# Patient Record
Sex: Female | Born: 1965 | Race: Black or African American | Hispanic: No | Smoking: Current every day smoker
Health system: Southern US, Community
[De-identification: ages and names within clinical notes are randomized; demographics above are authoritative.]

## PROBLEM LIST (undated history)

## (undated) ENCOUNTER — Emergency Department (HOSPITAL_COMMUNITY): Admission: EM | Payer: Medicare Other | Source: Home / Self Care

## (undated) DIAGNOSIS — K760 Fatty (change of) liver, not elsewhere classified: Secondary | ICD-10-CM

## (undated) DIAGNOSIS — J40 Bronchitis, not specified as acute or chronic: Secondary | ICD-10-CM

## (undated) DIAGNOSIS — K219 Gastro-esophageal reflux disease without esophagitis: Secondary | ICD-10-CM

## (undated) DIAGNOSIS — R519 Headache, unspecified: Secondary | ICD-10-CM

## (undated) DIAGNOSIS — G8929 Other chronic pain: Secondary | ICD-10-CM

## (undated) DIAGNOSIS — F419 Anxiety disorder, unspecified: Secondary | ICD-10-CM

## (undated) DIAGNOSIS — F329 Major depressive disorder, single episode, unspecified: Secondary | ICD-10-CM

## (undated) DIAGNOSIS — I10 Essential (primary) hypertension: Secondary | ICD-10-CM

## (undated) DIAGNOSIS — M797 Fibromyalgia: Secondary | ICD-10-CM

## (undated) DIAGNOSIS — J189 Pneumonia, unspecified organism: Secondary | ICD-10-CM

## (undated) DIAGNOSIS — R51 Headache: Secondary | ICD-10-CM

## (undated) DIAGNOSIS — M1990 Unspecified osteoarthritis, unspecified site: Secondary | ICD-10-CM

## (undated) DIAGNOSIS — F32A Depression, unspecified: Secondary | ICD-10-CM

## (undated) DIAGNOSIS — M549 Dorsalgia, unspecified: Secondary | ICD-10-CM

## (undated) DIAGNOSIS — M75101 Unspecified rotator cuff tear or rupture of right shoulder, not specified as traumatic: Secondary | ICD-10-CM

## (undated) DIAGNOSIS — K859 Acute pancreatitis without necrosis or infection, unspecified: Secondary | ICD-10-CM

## (undated) HISTORY — PX: COLONOSCOPY: SHX174

## (undated) HISTORY — PX: BONE GRAFT HIP ILIAC CREST: SUR159

## (undated) HISTORY — PX: OTHER SURGICAL HISTORY: SHX169

## (undated) HISTORY — PX: CHOLECYSTECTOMY: SHX55

## (undated) HISTORY — PX: BREAST BIOPSY: SHX20

## (undated) HISTORY — PX: ABDOMINAL HYSTERECTOMY: SHX81

## (undated) HISTORY — PX: BACK SURGERY: SHX140

## (undated) HISTORY — PX: CARPAL TUNNEL RELEASE: SHX101

---

## 1997-11-10 ENCOUNTER — Encounter: Admission: RE | Admit: 1997-11-10 | Discharge: 1998-02-08 | Payer: Self-pay | Admitting: Specialist

## 1998-04-03 ENCOUNTER — Emergency Department (HOSPITAL_COMMUNITY): Admission: EM | Admit: 1998-04-03 | Discharge: 1998-04-03 | Payer: Self-pay | Admitting: Internal Medicine

## 1998-05-12 ENCOUNTER — Ambulatory Visit (HOSPITAL_COMMUNITY): Admission: RE | Admit: 1998-05-12 | Discharge: 1998-05-12 | Payer: Self-pay | Admitting: Specialist

## 1998-09-27 ENCOUNTER — Encounter: Admission: RE | Admit: 1998-09-27 | Discharge: 1998-10-24 | Payer: Self-pay | Admitting: Specialist

## 1998-12-22 ENCOUNTER — Other Ambulatory Visit: Admission: RE | Admit: 1998-12-22 | Discharge: 1998-12-22 | Payer: Self-pay | Admitting: Obstetrics

## 2000-02-15 ENCOUNTER — Encounter: Payer: Self-pay | Admitting: Specialist

## 2000-02-15 ENCOUNTER — Encounter: Admission: RE | Admit: 2000-02-15 | Discharge: 2000-02-15 | Payer: Self-pay | Admitting: Specialist

## 2004-02-16 ENCOUNTER — Emergency Department (HOSPITAL_COMMUNITY): Admission: EM | Admit: 2004-02-16 | Discharge: 2004-02-17 | Payer: Self-pay | Admitting: Emergency Medicine

## 2004-11-30 ENCOUNTER — Other Ambulatory Visit: Admission: RE | Admit: 2004-11-30 | Discharge: 2004-11-30 | Payer: Self-pay | Admitting: Obstetrics and Gynecology

## 2005-03-25 ENCOUNTER — Emergency Department (HOSPITAL_COMMUNITY): Admission: EM | Admit: 2005-03-25 | Discharge: 2005-03-25 | Payer: Self-pay | Admitting: Emergency Medicine

## 2005-06-28 ENCOUNTER — Emergency Department (HOSPITAL_COMMUNITY): Admission: EM | Admit: 2005-06-28 | Discharge: 2005-06-28 | Payer: Self-pay | Admitting: Emergency Medicine

## 2005-08-10 ENCOUNTER — Emergency Department (HOSPITAL_COMMUNITY): Admission: EM | Admit: 2005-08-10 | Discharge: 2005-08-10 | Payer: Self-pay | Admitting: Emergency Medicine

## 2006-01-29 ENCOUNTER — Emergency Department (HOSPITAL_COMMUNITY): Admission: EM | Admit: 2006-01-29 | Discharge: 2006-01-29 | Payer: Self-pay | Admitting: Family Medicine

## 2006-02-25 ENCOUNTER — Encounter: Admission: RE | Admit: 2006-02-25 | Discharge: 2006-02-25 | Payer: Self-pay | Admitting: Internal Medicine

## 2006-06-11 ENCOUNTER — Observation Stay (HOSPITAL_COMMUNITY): Admission: EM | Admit: 2006-06-11 | Discharge: 2006-06-12 | Payer: Self-pay | Admitting: Emergency Medicine

## 2006-07-04 ENCOUNTER — Emergency Department (HOSPITAL_COMMUNITY): Admission: EM | Admit: 2006-07-04 | Discharge: 2006-07-04 | Payer: Self-pay | Admitting: Family Medicine

## 2006-10-29 ENCOUNTER — Encounter: Admission: RE | Admit: 2006-10-29 | Discharge: 2006-10-29 | Payer: Self-pay | Admitting: Specialist

## 2006-12-09 ENCOUNTER — Ambulatory Visit (HOSPITAL_BASED_OUTPATIENT_CLINIC_OR_DEPARTMENT_OTHER): Admission: RE | Admit: 2006-12-09 | Discharge: 2006-12-09 | Payer: Self-pay | Admitting: Specialist

## 2006-12-17 ENCOUNTER — Encounter: Admission: RE | Admit: 2006-12-17 | Discharge: 2006-12-17 | Payer: Self-pay | Admitting: Specialist

## 2007-01-29 ENCOUNTER — Encounter: Admission: RE | Admit: 2007-01-29 | Discharge: 2007-04-29 | Payer: Self-pay | Admitting: Physician Assistant

## 2007-06-10 ENCOUNTER — Observation Stay (HOSPITAL_COMMUNITY): Admission: EM | Admit: 2007-06-10 | Discharge: 2007-06-12 | Payer: Self-pay | Admitting: Emergency Medicine

## 2008-01-28 ENCOUNTER — Inpatient Hospital Stay (HOSPITAL_COMMUNITY): Admission: EM | Admit: 2008-01-28 | Discharge: 2008-02-01 | Payer: Self-pay | Admitting: Emergency Medicine

## 2008-03-04 ENCOUNTER — Emergency Department (HOSPITAL_COMMUNITY): Admission: EM | Admit: 2008-03-04 | Discharge: 2008-03-04 | Payer: Self-pay | Admitting: Emergency Medicine

## 2008-05-06 ENCOUNTER — Emergency Department (HOSPITAL_COMMUNITY): Admission: EM | Admit: 2008-05-06 | Discharge: 2008-05-07 | Payer: Self-pay | Admitting: Emergency Medicine

## 2008-06-27 ENCOUNTER — Ambulatory Visit (HOSPITAL_COMMUNITY): Admission: RE | Admit: 2008-06-27 | Discharge: 2008-06-27 | Payer: Self-pay | Admitting: Gastroenterology

## 2008-07-17 ENCOUNTER — Emergency Department (HOSPITAL_COMMUNITY): Admission: EM | Admit: 2008-07-17 | Discharge: 2008-07-17 | Payer: Self-pay | Admitting: Emergency Medicine

## 2008-09-08 ENCOUNTER — Encounter (INDEPENDENT_AMBULATORY_CARE_PROVIDER_SITE_OTHER): Payer: Self-pay | Admitting: General Surgery

## 2008-09-08 ENCOUNTER — Ambulatory Visit (HOSPITAL_COMMUNITY): Admission: RE | Admit: 2008-09-08 | Discharge: 2008-09-08 | Payer: Self-pay | Admitting: General Surgery

## 2008-10-11 ENCOUNTER — Encounter: Admission: RE | Admit: 2008-10-11 | Discharge: 2008-10-11 | Payer: Self-pay | Admitting: Internal Medicine

## 2009-02-06 ENCOUNTER — Encounter: Admission: RE | Admit: 2009-02-06 | Discharge: 2009-02-06 | Payer: Self-pay | Admitting: Gastroenterology

## 2009-07-28 ENCOUNTER — Inpatient Hospital Stay (HOSPITAL_COMMUNITY): Admission: EM | Admit: 2009-07-28 | Discharge: 2009-07-29 | Payer: Self-pay | Admitting: Emergency Medicine

## 2009-09-03 ENCOUNTER — Encounter: Admission: RE | Admit: 2009-09-03 | Discharge: 2009-09-03 | Payer: Self-pay | Admitting: Specialist

## 2009-12-27 ENCOUNTER — Emergency Department (HOSPITAL_COMMUNITY): Admission: EM | Admit: 2009-12-27 | Discharge: 2009-12-27 | Payer: Self-pay | Admitting: Emergency Medicine

## 2010-02-28 ENCOUNTER — Emergency Department (HOSPITAL_COMMUNITY): Admission: EM | Admit: 2010-02-28 | Discharge: 2010-02-28 | Payer: Self-pay | Admitting: Emergency Medicine

## 2010-03-26 ENCOUNTER — Inpatient Hospital Stay (HOSPITAL_COMMUNITY): Admission: RE | Admit: 2010-03-26 | Discharge: 2010-03-27 | Payer: Self-pay | Admitting: Specialist

## 2010-09-01 ENCOUNTER — Encounter: Payer: Self-pay | Admitting: Internal Medicine

## 2010-09-02 ENCOUNTER — Encounter: Payer: Self-pay | Admitting: Internal Medicine

## 2010-09-14 ENCOUNTER — Emergency Department (HOSPITAL_COMMUNITY)
Admission: EM | Admit: 2010-09-14 | Discharge: 2010-09-14 | Disposition: A | Discharge status: Home / Self Care | Payer: Medicare Other | Attending: Emergency Medicine | Admitting: Emergency Medicine

## 2010-09-14 ENCOUNTER — Emergency Department (HOSPITAL_COMMUNITY): Payer: Medicare Other

## 2010-09-14 ENCOUNTER — Encounter (HOSPITAL_COMMUNITY): Payer: Self-pay | Admitting: Radiology

## 2010-09-14 DIAGNOSIS — E669 Obesity, unspecified: Secondary | ICD-10-CM | POA: Insufficient documentation

## 2010-09-14 DIAGNOSIS — I10 Essential (primary) hypertension: Secondary | ICD-10-CM | POA: Insufficient documentation

## 2010-09-14 DIAGNOSIS — R109 Unspecified abdominal pain: Secondary | ICD-10-CM | POA: Insufficient documentation

## 2010-09-14 DIAGNOSIS — R630 Anorexia: Secondary | ICD-10-CM | POA: Insufficient documentation

## 2010-09-14 DIAGNOSIS — R112 Nausea with vomiting, unspecified: Secondary | ICD-10-CM | POA: Insufficient documentation

## 2010-09-14 DIAGNOSIS — Z79899 Other long term (current) drug therapy: Secondary | ICD-10-CM | POA: Insufficient documentation

## 2010-09-14 DIAGNOSIS — K219 Gastro-esophageal reflux disease without esophagitis: Secondary | ICD-10-CM | POA: Insufficient documentation

## 2010-09-14 DIAGNOSIS — E119 Type 2 diabetes mellitus without complications: Secondary | ICD-10-CM | POA: Insufficient documentation

## 2010-09-14 DIAGNOSIS — R197 Diarrhea, unspecified: Secondary | ICD-10-CM | POA: Insufficient documentation

## 2010-09-14 HISTORY — DX: Essential (primary) hypertension: I10

## 2010-09-14 LAB — COMPREHENSIVE METABOLIC PANEL
ALT: 14 U/L (ref 0–35)
AST: 17 U/L (ref 0–37)
Albumin: 3.2 g/dL — ABNORMAL LOW (ref 3.5–5.2)
CO2: 27 mEq/L (ref 19–32)
Calcium: 9.2 mg/dL (ref 8.4–10.5)
Creatinine, Ser: 0.85 mg/dL (ref 0.4–1.2)
GFR calc Af Amer: >60 mL/min (ref >60)
Sodium: 135 mEq/L (ref 135–145)
Total Protein: 7.3 g/dL (ref 6.0–8.3)

## 2010-09-14 LAB — CBC
Hemoglobin: 11.6 g/dL — ABNORMAL LOW (ref 12.0–15.0)
MCH: 24.4 pg — ABNORMAL LOW (ref 26.0–34.0)
MCHC: 33.5 g/dL (ref 30.0–36.0)
Platelets: 288 10*3/uL (ref 150–400)
RDW: 16.8 % — ABNORMAL HIGH (ref 11.5–15.5)

## 2010-09-14 LAB — DIFFERENTIAL
Basophils Absolute: 0 10*3/uL (ref 0.0–0.1)
Basophils Relative: 0 % (ref 0–1)
Eosinophils Absolute: 0.1 10*3/uL (ref 0.0–0.7)
Monocytes Absolute: 0.9 10*3/uL (ref 0.1–1.0)
Monocytes Relative: 10 % (ref 3–12)
Neutro Abs: 6.8 10*3/uL (ref 1.7–7.7)

## 2010-09-14 MED ORDER — IOHEXOL 300 MG/ML  SOLN: 100.0000 mL | Freq: Once | INTRAMUSCULAR | Status: DC | PRN

## 2010-10-19 ENCOUNTER — Other Ambulatory Visit: Payer: Self-pay | Admitting: Internal Medicine

## 2010-10-19 DIAGNOSIS — Z1231 Encounter for screening mammogram for malignant neoplasm of breast: Secondary | ICD-10-CM

## 2010-10-26 LAB — CBC
HCT: 42.9 % (ref 36.0–46.0)
MCV: 83.6 fL (ref 78.0–100.0)
RBC: 5.13 MIL/uL — ABNORMAL HIGH (ref 3.87–5.11)
RDW: 13.5 % (ref 11.5–15.5)
WBC: 11.1 10*3/uL — ABNORMAL HIGH (ref 4.0–10.5)

## 2010-10-26 LAB — GLUCOSE, CAPILLARY
Glucose-Capillary: 136 mg/dL — ABNORMAL HIGH (ref 70–99)
Glucose-Capillary: 168 mg/dL — ABNORMAL HIGH (ref 70–99)
Glucose-Capillary: 174 mg/dL — ABNORMAL HIGH (ref 70–99)
Glucose-Capillary: 208 mg/dL — ABNORMAL HIGH (ref 70–99)
Glucose-Capillary: 209 mg/dL — ABNORMAL HIGH (ref 70–99)

## 2010-10-26 LAB — COMPREHENSIVE METABOLIC PANEL
ALT: 30 U/L (ref 0–35)
Albumin: 4.2 g/dL (ref 3.5–5.2)
Alkaline Phosphatase: 107 U/L (ref 39–117)
BUN: 7 mg/dL (ref 6–23)
Chloride: 103 mEq/L (ref 96–112)
Glucose, Bld: 109 mg/dL — ABNORMAL HIGH (ref 70–99)
Potassium: 3.8 mEq/L (ref 3.5–5.1)
Sodium: 135 mEq/L (ref 135–145)
Total Bilirubin: 0.7 mg/dL (ref 0.3–1.2)
Total Protein: 7 g/dL (ref 6.0–8.3)

## 2010-10-26 LAB — DIFFERENTIAL
Basophils Absolute: 0 10*3/uL (ref 0.0–0.1)
Basophils Relative: 0 % (ref 0–1)
Eosinophils Absolute: 0.2 10*3/uL (ref 0.0–0.7)
Monocytes Absolute: 0.7 10*3/uL (ref 0.1–1.0)
Monocytes Relative: 6 % (ref 3–12)

## 2010-10-26 LAB — HEMOGLOBIN A1C
Hgb A1c MFr Bld: 6.5 % — ABNORMAL HIGH (ref <5.7)
Mean Plasma Glucose: 140 mg/dL — ABNORMAL HIGH (ref <117)

## 2010-10-27 LAB — COMPREHENSIVE METABOLIC PANEL
Alkaline Phosphatase: 92 U/L (ref 39–117)
BUN: 8 mg/dL (ref 6–23)
Calcium: 9 mg/dL (ref 8.4–10.5)
Creatinine, Ser: 0.69 mg/dL (ref 0.4–1.2)
Glucose, Bld: 110 mg/dL — ABNORMAL HIGH (ref 70–99)
Total Protein: 7.1 g/dL (ref 6.0–8.3)

## 2010-10-27 LAB — CBC
HCT: 43.3 % (ref 36.0–46.0)
MCH: 29 pg (ref 26.0–34.0)
MCHC: 32.9 g/dL (ref 30.0–36.0)
MCV: 88.1 fL (ref 78.0–100.0)
Platelets: 276 10*3/uL (ref 150–400)
RDW: 14.1 % (ref 11.5–15.5)

## 2010-10-27 LAB — DIFFERENTIAL
Basophils Relative: 0 % (ref 0–1)
Lymphocytes Relative: 24 % (ref 12–46)
Lymphs Abs: 2.7 10*3/uL (ref 0.7–4.0)
Monocytes Relative: 6 % (ref 3–12)
Neutro Abs: 7.9 10*3/uL — ABNORMAL HIGH (ref 1.7–7.7)
Neutrophils Relative %: 69 % (ref 43–77)

## 2010-10-27 LAB — URINALYSIS, ROUTINE W REFLEX MICROSCOPIC
Glucose, UA: NEGATIVE mg/dL
Leukocytes, UA: NEGATIVE
Protein, ur: NEGATIVE mg/dL
Specific Gravity, Urine: >1.046 — ABNORMAL HIGH (ref 1.005–1.030)

## 2010-10-27 LAB — URINE MICROSCOPIC-ADD ON

## 2010-10-29 LAB — DIFFERENTIAL
Basophils Absolute: 0 10*3/uL (ref 0.0–0.1)
Basophils Relative: 0 % (ref 0–1)
Eosinophils Absolute: 0.1 10*3/uL (ref 0.0–0.7)
Monocytes Absolute: 0.5 10*3/uL (ref 0.1–1.0)
Monocytes Relative: 5 % (ref 3–12)
Neutro Abs: 6.1 10*3/uL (ref 1.7–7.7)
Neutrophils Relative %: 62 % (ref 43–77)

## 2010-10-29 LAB — HEPATIC FUNCTION PANEL
ALT: 32 U/L (ref 0–35)
Bilirubin, Direct: 0.1 mg/dL (ref 0.0–0.3)
Indirect Bilirubin: 0.5 mg/dL (ref 0.3–0.9)
Total Protein: 7.8 g/dL (ref 6.0–8.3)

## 2010-10-29 LAB — CBC
MCHC: 33.9 g/dL (ref 30.0–36.0)
MCV: 87.2 fL (ref 78.0–100.0)
RDW: 13 % (ref 11.5–15.5)

## 2010-10-29 LAB — POCT CARDIAC MARKERS
CKMB, poc: <1 ng/mL — ABNORMAL LOW (ref 1.0–8.0)
Myoglobin, poc: 55.3 ng/mL (ref 12–200)

## 2010-10-29 LAB — BASIC METABOLIC PANEL
BUN: 6 mg/dL (ref 6–23)
Calcium: 8.9 mg/dL (ref 8.4–10.5)
GFR calc non Af Amer: >60 mL/min (ref >60)
Glucose, Bld: 115 mg/dL — ABNORMAL HIGH (ref 70–99)
Potassium: 3.7 mEq/L (ref 3.5–5.1)
Sodium: 132 mEq/L — ABNORMAL LOW (ref 135–145)

## 2010-10-30 ENCOUNTER — Ambulatory Visit
Admission: RE | Admit: 2010-10-30 | Discharge: 2010-10-30 | Disposition: A | Discharge status: Home / Self Care | Payer: Medicare Other | Source: Ambulatory Visit | Attending: Internal Medicine | Admitting: Internal Medicine

## 2010-10-30 DIAGNOSIS — Z1231 Encounter for screening mammogram for malignant neoplasm of breast: Secondary | ICD-10-CM

## 2010-11-12 ENCOUNTER — Other Ambulatory Visit: Payer: Self-pay | Admitting: Specialist

## 2010-11-12 DIAGNOSIS — M542 Cervicalgia: Secondary | ICD-10-CM

## 2010-11-12 LAB — DIFFERENTIAL
Basophils Relative: 0 % (ref 0–1)
Eosinophils Absolute: 0.1 10*3/uL (ref 0.0–0.7)
Eosinophils Absolute: 0.1 10*3/uL (ref 0.0–0.7)
Eosinophils Relative: 1 % (ref 0–5)
Eosinophils Relative: 1 % (ref 0–5)
Lymphs Abs: 2.6 10*3/uL (ref 0.7–4.0)
Lymphs Abs: 3.4 10*3/uL (ref 0.7–4.0)
Monocytes Absolute: 0.6 10*3/uL (ref 0.1–1.0)
Monocytes Absolute: 0.7 10*3/uL (ref 0.1–1.0)
Monocytes Relative: 7 % (ref 3–12)
Neutrophils Relative %: 59 % (ref 43–77)

## 2010-11-12 LAB — URINE MICROSCOPIC-ADD ON

## 2010-11-12 LAB — COMPREHENSIVE METABOLIC PANEL
ALT: 28 U/L (ref 0–35)
ALT: 29 U/L (ref 0–35)
ALT: 29 U/L (ref 0–35)
AST: 20 U/L (ref 0–37)
AST: 21 U/L (ref 0–37)
Albumin: 3.6 g/dL (ref 3.5–5.2)
Albumin: 3.8 g/dL (ref 3.5–5.2)
BUN: 8 mg/dL (ref 6–23)
CO2: 26 mEq/L (ref 19–32)
CO2: 27 mEq/L (ref 19–32)
Calcium: 8.6 mg/dL (ref 8.4–10.5)
Calcium: 8.7 mg/dL (ref 8.4–10.5)
Calcium: 9 mg/dL (ref 8.4–10.5)
Chloride: 103 mEq/L (ref 96–112)
GFR calc Af Amer: >60 mL/min (ref >60)
GFR calc Af Amer: >60 mL/min (ref >60)
GFR calc non Af Amer: >60 mL/min (ref >60)
GFR calc non Af Amer: >60 mL/min (ref >60)
Glucose, Bld: 129 mg/dL — ABNORMAL HIGH (ref 70–99)
Sodium: 135 mEq/L (ref 135–145)
Sodium: 137 mEq/L (ref 135–145)
Sodium: 137 mEq/L (ref 135–145)
Total Bilirubin: 0.7 mg/dL (ref 0.3–1.2)
Total Protein: 7 g/dL (ref 6.0–8.3)

## 2010-11-12 LAB — MAGNESIUM
Magnesium: 1.8 mg/dL (ref 1.5–2.5)
Magnesium: 2 mg/dL (ref 1.5–2.5)

## 2010-11-12 LAB — LIPID PANEL
HDL: 38 mg/dL — ABNORMAL LOW (ref >39)
Triglycerides: 129 mg/dL (ref <150)
VLDL: 26 mg/dL (ref 0–40)

## 2010-11-12 LAB — URINALYSIS, ROUTINE W REFLEX MICROSCOPIC
Bilirubin Urine: NEGATIVE
Ketones, ur: NEGATIVE mg/dL
Leukocytes, UA: NEGATIVE
Nitrite: NEGATIVE
Specific Gravity, Urine: 1.016 (ref 1.005–1.030)
Urobilinogen, UA: 0.2 mg/dL (ref 0.0–1.0)

## 2010-11-12 LAB — CBC
HCT: 42.2 % (ref 36.0–46.0)
Hemoglobin: 14.4 g/dL (ref 12.0–15.0)
MCHC: 34.1 g/dL (ref 30.0–36.0)
MCHC: 34.1 g/dL (ref 30.0–36.0)
MCV: 86.6 fL (ref 78.0–100.0)
Platelets: 236 10*3/uL (ref 150–400)
RBC: 4.26 MIL/uL (ref 3.87–5.11)
RBC: 4.87 MIL/uL (ref 3.87–5.11)
RDW: 13.4 % (ref 11.5–15.5)
WBC: 10.5 10*3/uL (ref 4.0–10.5)
WBC: 8.5 10*3/uL (ref 4.0–10.5)

## 2010-11-12 LAB — GLUCOSE, CAPILLARY
Glucose-Capillary: 126 mg/dL — ABNORMAL HIGH (ref 70–99)
Glucose-Capillary: 133 mg/dL — ABNORMAL HIGH (ref 70–99)
Glucose-Capillary: 151 mg/dL — ABNORMAL HIGH (ref 70–99)
Glucose-Capillary: 163 mg/dL — ABNORMAL HIGH (ref 70–99)

## 2010-11-12 LAB — LIPASE, BLOOD
Lipase: 367 U/L — ABNORMAL HIGH (ref 11–59)
Lipase: 59 U/L (ref 11–59)

## 2010-11-12 LAB — HEMOGLOBIN A1C: Hgb A1c MFr Bld: 6.8 % — ABNORMAL HIGH (ref 4.6–6.1)

## 2010-11-12 LAB — PHOSPHORUS: Phosphorus: 3.4 mg/dL (ref 2.3–4.6)

## 2010-11-12 LAB — AMYLASE: Amylase: 104 U/L (ref 0–105)

## 2010-11-22 ENCOUNTER — Ambulatory Visit
Admission: RE | Admit: 2010-11-22 | Discharge: 2010-11-22 | Disposition: A | Discharge status: Home / Self Care | Payer: Medicare Other | Source: Ambulatory Visit | Attending: Specialist | Admitting: Specialist

## 2010-11-22 DIAGNOSIS — M542 Cervicalgia: Secondary | ICD-10-CM

## 2010-11-26 LAB — COMPREHENSIVE METABOLIC PANEL
AST: 28 U/L (ref 0–37)
Albumin: 3.9 g/dL (ref 3.5–5.2)
BUN: 7 mg/dL (ref 6–23)
Calcium: 9.6 mg/dL (ref 8.4–10.5)
Chloride: 104 mEq/L (ref 96–112)
Creatinine, Ser: 0.63 mg/dL (ref 0.4–1.2)
GFR calc Af Amer: >60 mL/min (ref >60)
Total Protein: 6.9 g/dL (ref 6.0–8.3)

## 2010-11-26 LAB — CBC
HCT: 42.3 % (ref 36.0–46.0)
MCHC: 32.6 g/dL (ref 30.0–36.0)
MCV: 85 fL (ref 78.0–100.0)
Platelets: 262 10*3/uL (ref 150–400)
RDW: 15.2 % (ref 11.5–15.5)
WBC: 9.3 10*3/uL (ref 4.0–10.5)

## 2010-11-26 LAB — GLUCOSE, CAPILLARY
Glucose-Capillary: 185 mg/dL — ABNORMAL HIGH (ref 70–99)
Glucose-Capillary: 232 mg/dL — ABNORMAL HIGH (ref 70–99)

## 2010-12-25 NOTE — H&P (Signed)
[name], BENNINGER NO.:  [id number]   MEDICAL RECORD NO.:  [id number]          PATIENT TYPE:  EMS   LOCATION:  MAJO                         FACILITY:  [facility]   PHYSICIAN:  Nanetta Batty, M.D.   DATE OF BIRTH:  12/22/1965   DATE OF ADMISSION:  06/10/2007  DATE OF DISCHARGE:                              HISTORY & PHYSICAL   CHIEF COMPLAINT:  Chest pain, chest tightness.   HISTORY OF PRESENT ILLNESS:  Ms. [name] is a 45 year old female followed  by Dr. Allyson Sabal with a history of chest tightness and shortness of breath.  This has been going on since January 2008.  Her primary care physician,  Dr. Concepcion Elk, sent her over to see Dr. Allyson Sabal.  She had a negative Myoview  and echocardiogram  in March 2008, although I do not have those details.  She continued to have chest pain and chest tightness and palpitations  all summer.  Dr. Allyson Sabal saw her in August and put her on Toprol 25 mg a  day and Imdur 30 mg a day but the patient never started this.  Today,  she was at her beauty salon sitting in her car and felt uncomfortable.  Her friend saw her there and insisted she come to the hospital.  She  denies any associated diaphoresis.  She has had tingling in her left  hand, palpitations, shortness of breath, chest tightness that goes to  her back and mild nausea.  She is admitted now for further evaluation.   CURRENT MEDICATIONS:  1. Lotrel, she is unclear of the dose.  2. Zoloft 200 mg h.s.  3. Multivitamin daily.  4. Aspirin.   ALLERGIES:  No known drug allergies.   PAST MEDICAL HISTORY:  Remarkable for  1. Chronic back pain.  2. She has had a laminectomy and a frontal approach spinal fusion.  3. She has treated hypertension.  4. Dyslipidemia.  5. She has had a partial hysterectomy in the past.   SOCIAL HISTORY:  She is married.  She has three children, no  grandchildren.  She is disabled because of back problems.  She smokes a  half pack a day.   FAMILY HISTORY:   Remarkable for coronary disease.  Her father apparently  has had coronary disease.   REVIEW OF SYSTEMS:  Remarkable for gastroesophageal reflux symptoms.  She also has urinary stress incontinence.  There is no history of  diabetes or thyroid problems.   PHYSICAL EXAMINATION:  VITAL SIGNS:  Blood pressure 129/70, pulse 80,  temperature 98, O2 saturation 98% on room air.  GENERAL APPEARANCE:  She is an anxious, obese African American female in  no acute distress.  HEENT:  Normocephalic.  Extraocular movements intact.  Sclerae are  nonicteric.  NECK:  Without JVD or bruit.  CHEST:  Clear to auscultation and percussion.  CARDIOVASCULAR:  Regular rate and rhythm without murmurs, rubs, or  gallops.  Normal S1 and S2.  ABDOMEN:  Obese and nontender.  EXTREMITIES:  Without edema.  Distal pulses are 3+/4 bilaterally.  SKIN:  Warm and dry.  NEUROLOGIC:  Grossly  intact.  She is awake, alert and oriented and  cooperative.  She moves all extremities without obvious deficit.   Her EKG shows sinus rhythm with nonspecific ST changes.   LABORATORY DATA:  White count 9.8, hemoglobin 14.1, hematocrit 42.5,  platelets 285.  Sodium 137, potassium 4.2, BUN 11, creatinine 0.7.  LFTs  are normal.  Troponin is less than 0.5.   Chest x-ray shows no active disease.   IMPRESSION:  1. Chest pain consistent with unstable angina with multiple cardiac      risk factors, rule out coronary disease.  2. Palpitations.  3. Treated hypertension.  4. Treated dyslipidemia.  5. Smoker.  6. Family history of coronary disease.  7. Chronic back pain, status post back surgery x2 in the past.   PLAN:  Patient was seen by Dr. Jenne Campus and myself in the emergency room.  She will be set up for diagnostic catheterization.  We will go ahead and  admit her to telemetry, start her on low dose beta-blocker, aspirin, PPI  and low dose nitrate.  We will also get a CT scan of her chest tonight  to rule out aortic  dissection.      Abelino Derrick, P.A.      Nanetta Batty, M.D.  Electronically Signed    LKK/MEDQ  D:  06/10/2007  T:  06/10/2007  Job:  [id number]

## 2010-12-25 NOTE — Consult Note (Signed)
NAMEGLORIANN, [name] NO.:  [id number]   MEDICAL RECORD NO.:  [id number]          PATIENT TYPE:  EMS   LOCATION:  ED                           FACILITY:  [facility]   PHYSICIAN:  Georgiana Spinner, M.D.    DATE OF BIRTH:  Aug 20, 1965   DATE OF CONSULTATION:  07/17/2008  DATE OF DISCHARGE:  07/17/2008                                 CONSULTATION   [name] called [place] stating that she was recently hospitalized with  pancreatitis presumably secondary to gallbladder disease.  She was  scheduled to have her gallbladder removed by Dr. Lindie Spruce with an  appointment to see him as an outpatient in the near future.  However,  she states she has had recurrence of this same pain that she had.  It  caused her hospitalization and as per that she should come to the  emergency room and I have concurred that she should get further  evaluation.           ______________________________  Georgiana Spinner, M.D.     GMO/MEDQ  D:  07/17/2008  T:  07/17/2008  Job:  [id number]   cc:   Anselmo Rod, M.D.

## 2010-12-25 NOTE — Discharge Summary (Signed)
[name], [name] NO.:  [id number]   MEDICAL RECORD NO.:  [id number]          PATIENT TYPE:  OBV   LOCATION:  4702                         FACILITY:  [facility]   PHYSICIAN:  Nanetta Batty, M.D.   DATE OF BIRTH:  [birth year]   DATE OF ADMISSION:  06/10/2007  DATE OF DISCHARGE:  06/12/2007                               DISCHARGE SUMMARY   DISCHARGE DIAGNOSES:  1. Chest pain worrisome for unstable angina, catheterization this      admission revealing normal coronaries and normal left ventricular      function.  2. Treated hypertension.  3. Treated dyslipidemia.  4. History of smoking.  5. Chronic back pain.  6. Obesity.   HOSPITAL COURSE:  [name] is a 45 year old female who has been  followed by Dr. Allyson Sabal.  She has multiple cardiac risk factors.  She had  a previous negative nuclear study and normal echo.  She saw Dr. Allyson Sabal in  August and was prescribed Toprol and Imdur, although she did not start  these.  She has been having fairly constant chest tightness.  She was  admitted to the ER June 10, 2007.  CKs were elevated, but her MB and  troponins were negative.  CT of the chest and abdomen showed no aortic  dissection.  By history, she did have a negative CT for pulmonary  embolism back in November 2007.  She also has had an MRI in March 2008  that did not show any acute changes.  The patient underwent diagnostic  catheterization June 11, 2007 by Dr. Allyson Sabal which revealed normal  coronaries and normal LV function.  We feel she can be discharged  June 12, 2007.  We have added anti-inflammatory to her current  regimen.   DISCHARGE MEDICATIONS:  1. Lotrel 10/20 daily.  2. Zoloft 200 mg a day.  3. Multivitamin daily.  4. Omeprazole 40 mg a day.  5. Zocor 20 mg a day.  6. Aleve 2 b.i.d. p.r.n.   LABORATORY DATA:  INR is 1.1, CK-MB and troponins were negative,  although her CKs are slightly elevated at 200.  Liver functions were  normal.  Sodium  138, potassium 3.8, BUN 10, creatinine 0.79, white count  9.2, hemoglobin 12.9, hematocrit 30.7, platelets 279.   DIAGNOSTICS:  1. CT as noted above.  2. EKG shows sinus rhythm without acute changes.   DISPOSITION:  The patient is discharged in stable condition.   FOLLOW UP:  1. She will see Dr. Allyson Sabal on a p.r.n. basis.  2. She had been instructed to follow up with Dr. Concepcion Elk in a couple      of weeks.      Abelino Derrick, P.A.      Nanetta Batty, M.D.  Electronically Signed    LKK/MEDQ  D:  06/12/2007  T:  06/12/2007  Job:  [id number]   cc:   Fleet Contras, M.D.

## 2010-12-25 NOTE — Cardiovascular Report (Signed)
NAMEALLEXUS, OVENS NO.:  [id number]   MEDICAL RECORD NO.:  [id number]          PATIENT TYPE:  OBV   LOCATION:  4702                         FACILITY:  [facility]   PHYSICIAN:  Nanetta Batty, M.D.   DATE OF BIRTH:  1965/10/07   DATE OF PROCEDURE:  DATE OF DISCHARGE:                            CARDIAC CATHETERIZATION   Ms. Hinesley is a 45 year old mildly overweight African-American female  whom I initially saw on May 12, 2007 because of chest pain.  She  did have a Persantine Myoview which was negative on July 28, 2006.  Her other problems include tobacco abuse, hypertension, hyperlipidemia  and disability because of back and hip issues.  I have begun her on  medications, however she was admitted on 10/29 with unstable angina.  She ruled out for myocardial infarction.  She presents now for a  diagnostic coronary angiography to define her anatomy and rule out  ischemic etiology.   PROCEDURE DESCRIPTION:  The patient was brought to the second floor  [facility], cardiac cath lab in the postabsorptive state.  She was  premedicated with p.o. Valium and IV Versed.  Her right groin was  prepped and shaved in the usual sterile fashion.  One percent Xylocaine  was used for local anesthesia.  A 6-French sheath was inserted into the  right femoral artery using the standard Seldinger technique.  A 6-French  right and left Judkins diagnostic catheter, as well as a 6-French  pigtail catheter, were used for selective coronary angiography and left  ventriculography respectively.  Visipaque dye was used throughout the  entirety of the case.  __________  ventricular blood pressures were  recorded.   HEMODYNAMICS:  1. Aortic systolic pressure 125, diastolic pressure 79.  2. Left ventricular systolic pressure 125 and diastolic pressure 42.   SELECTIVE CORONARY ANGIOGRAPHY:  1. Left main normal.  2. LAD normal.  3. Left circumflex normal.  4. Right coronary artery was  dominant and normal.   LEFT VENTRICULOGRAPHY:  RAO left ventriculogram was performed using 25  cc of Visipaque dye at 12 cc per second.  The overall LVEF was estimated  at greater than 60% without focal wall motion abnormalities.   IMPRESSION:  Ms. Bari has normal coronary arteries, normal LV  function.  I believe her chest pain is noncardiac and possibly related  to reflux.  A femoral shot was obtained to determine suitability for  star close, which I thought  was acceptable and above the bifurcation.  A StarClose hemostasis device  was then deployed successfully resulting in complete hemostasis.  The  patient left the lab in stable condition.  We will observe her for the  remainder of the day, and she will go home in the morning.      Nanetta Batty, M.D.  Electronically Signed     JB/MEDQ  D:  06/11/2007  T:  06/11/2007  Job:  [id number]   cc:   2nd Floor [facility]  [facility]  Fleet Contras, M.D.

## 2010-12-25 NOTE — Op Note (Signed)
[name], [name]                 ACCOUNT NO.:  [id number]   MEDICAL RECORD NO.:  [id number]          PATIENT TYPE:  AMB   LOCATION:  SDS                          FACILITY:  [facility]   PHYSICIAN:  Cherylynn Ridges, M.D.    DATE OF BIRTH:  1966-08-12   DATE OF PROCEDURE:  09/08/2008  DATE OF DISCHARGE:  09/08/2008                               OPERATIVE REPORT   PREOPERATIVE DIAGNOSES:  Biliary dyskinesia and gallbladder polyps.   POSTOPERATIVE DIAGNOSES:  Biliary dyskinesia and gallbladder polyps.   PROCEDURE:  Laparoscopic cholecystectomy with cholangiogram.   SURGEON:  Cherylynn Ridges, MD   ASSISTANT:  Wilmon Arms. Tsuei, MD   ANESTHESIA:  General endotracheal.   ESTIMATED BLOOD LOSS:  Less than 20 mL.   COMPLICATIONS:  No complications.   CONDITION:  Stable.   FINDINGS:  Adhesions in the gallbladder fossa, normal intraoperative  cholangiogram.   INDICATIONS FOR OPERATION:  The patient is a 45 year old noted to have  gallbladder polyps on ultrasound and a biliary dyskinesia, who comes in  now for laparoscopic cholecystectomy.   OPERATION:  The patient was taken to the operating room, placed on table  in supine position.  After an adequate general endotracheal anesthetic  was administered, she was prepped and draped in the usual sterile manner  exposing the midline in the right upper quadrant.   A supraumbilical midline incision was made using #15 blade and taken  down to the midline fascia.  We grabbed the fascia with Kocher clamps x2  and then incised between the clamps using a 15 blade entered the  preperitoneal space.  While tenting up on the edges of the fascia, we  bluntly dissected down into the peritoneal cavity, then passed a  pursestring suture of 0 Vicryl around the fascial opening.  We  subsequently passed a Hasson cannula into the peritoneal cavity under  direct vision and then secured in place with pursestring suture.  We  insufflated carbon dioxide gas up to a  maximum intraabdominal pressure  of 15 mmHg.  Then, placed the patient in reverse Trendelenburg and left  side was tilted down.   The laparoscope with attached camera and light source was inserted  through the Hasson cannula, we inspected the peritoneal cavity, where  there were some lower abdominal midline adhesions associated with a  previous midline scar in the lower portion.  Two right costal margin of  5-mm cannulas and a subxiphoid 11-12 mm cannula passed under direct  vision.  The gallbladder was grasped with a ratcheted grasper through  the lateral most cannula and retracted towards the anterior abdominal  wall in the right upper quadrant.  A second one was placed along the  infundibulum; however, it was scarred down towards the fossa and had to  be freed up using electrocautery with a hook dissector freeing up the  peritoneum over the triangle of Calot and hepatoduodenal triangle.  We  were subsequently able to isolate out the cystic duct and the cystic  artery.  We clipped the artery proximally x2, then distally and  transected it.  This  allowed [place] to create a better window between the  gallbladder wall, the cystic duct, and common bile duct.   We subsequently isolated the cystic duct and placed a clip along the  gallbladder side.  We made a cholecystodochotomy using laparoscopic  scissors, through which a Cook catheter, which had been passed through  the anterior abdominal wall, was passed in order to perform the  cholangiogram.  The cholangiogram showed some mild dilatation of the  proximal hepatic duct, good flow into the duodenum.  No intraductal  filling defects and good proximal flow.   Subsequent to performing cholangiogram, we moved the catheter and clips  securing in place.  We clipped the distal cystic duct x2, then  transected it.  We then dissected out the gallbladder from its bed where  there was some scarring and some mild bleeding, which was cauterized.    Once the gallbladder completely detached from its liver bed, we removed  it from the abdomen at the supraumbilical site.  We closed off the  fascia at that site using a pursestring suture, which was then placed  after removing the gallbladder.  We irrigated with about 500 mL of  saline; however, there was minimal bleeding and minimal bile or no bile  spillage.   Once we aspirated all fluid and gas from the gallbladder fossa and above  the liver, we removed all cannulas.  We injected 0.25% Marcaine with epi  at all sites.  The supraumbilical and subxiphoid skin sites were closed  using a running subcuticular stitch of 4-0 Monocryl.  Then, Steri-  Strips, Dermabond, and Tegaderms were used at all other sites.  All  counts were correct.      Cherylynn Ridges, M.D.  Electronically Signed     JOW/MEDQ  D:  09/08/2008  T:  09/09/2008  Job:  [id number]   cc:   Anselmo Rod, M.D.

## 2010-12-25 NOTE — Discharge Summary (Signed)
[name], [name] NO.:  [id number]   MEDICAL RECORD NO.:  [id number]          PATIENT TYPE:  INP   LOCATION:  5507                         FACILITY:  [facility]   PHYSICIAN:  Fleet Contras, M.D.    DATE OF BIRTH:  1966/03/08   DATE OF ADMISSION:  01/27/2008  DATE OF DISCHARGE:  02/01/2008                               DISCHARGE SUMMARY   HISTORY OF PRESENT ILLNESS:  [name] is a 45 year old African  American lady with past medical history significant for systemic  hypertension; dyslipidemia; depression; anxiety; obesity; and connected  lumbar disk, status post L4-L5 fusion.  She was admitted via the  emergency room with 3 days of left upper quadrant epigastric abdominal  pain and sudden onset progressively getting worse associated with nausea  and vomiting.  There was no radiation of pain and no diarrhea.  She had  no history of gallstones, no history of alcohol use or NSAID use.  She  had no fever, chills, but did have severe pain.  Vital signs were  stable.  Her lipase was over 500.  CT scan of the abdomen was reported  as showing acute pancreatitis.  She was therefore admitted for close  monitoring and appropriate management.   HOSPITAL COURSE:  On admission, her renal function test within normal  limits.  Lipase was 578 and glucose was 169.  She received intravenous  fluid, intravenous proton, intravenous analgesia, and intravenous  antibiotics.  She was able to tolerate clear liquids over the next day.  This was slowly advanced and her pain subsided.  By January 29, 2008, her  lipase was down to 83.  Her LFTs were within normal limits.  Ultrasound  scan of the abdomen showed fatty liver with no gallstones and she did  have elevated glucose, this was monitored in the outpatient.   DISCHARGE DIAGNOSES:  1. Acute pancreatitis.  2. Hyperglycemia.  3. Systemic hypertension.   FOLLOWUP:  She is to follow up with me in the office in about 2 weeks.   CONDITION  ON DISCHARGE:  Stable.   DISPOSITION:  For home.   DISCHARGE MEDICATIONS:  1. Protonix 40 mg a day.  2. Nicotine patches 21 mg daily.  3. Toprol-XL 25 mg daily.  4. Percocet 5/325 mg 1 p.o. q.6 p.r.n.  5. Ambien 10 mg nightly.  6. Lotrel 10/20 mg 1 tablet daily.  7. Xanax 0.5 mg 1 tablet b.i.d. p.r.n.  8. Zoloft 25 mg 2 tablets daily.  9. Zocor 20 mg daily.  10.Flexeril 10 mg t.i.d. p.r.n.  11.Multivitamins 1 tablet p.o. daily.   Plan of care was discussed with her and questions were answered.      Fleet Contras, M.D.  Electronically Signed     EA/MEDQ  D:  02/28/2008  T:  02/29/2008  Job:  [id number]

## 2010-12-28 NOTE — Op Note (Signed)
[name], [name]                 ACCOUNT NO.:  [id number]   MEDICAL RECORD NO.:  [id number]          PATIENT TYPE:  AMB   LOCATION:  DSC                          FACILITY:  [facility]   PHYSICIAN:  Kerrin Champagne, M.D.   DATE OF BIRTH:  [birth year]   DATE OF PROCEDURE:  12/09/2006  DATE OF DISCHARGE:                               OPERATIVE REPORT   PREOPERATIVE DIAGNOSIS:  Right carpal tunnel syndrome, moderately  severe, with both motor and sensory involvement.   POSTOPERATIVE DIAGNOSIS:  Right carpal tunnel syndrome, moderately  severe, with both motor and sensory involvement.   PROCEDURE:  Right open carpal tunnel release.   SURGEON:  Kerrin Champagne, M.D.   ASSISTANT:  None.   ANESTHESIA:  General via oral obturator, Dr. Kipp Brood, supplemented  with local infiltration with Marcaine 0.5% plain 4 mL.   SPECIMENS:  None.   ESTIMATED BLOOD LOSS:  Less than 5 mL.   COMPLICATIONS:  None.   TOTAL TOURNIQUET TIME:  At 275 mmHg, 9 minutes.   The patient returned to the PACU in good condition.   HISTORY OF PRESENT ILLNESS:  The patient is a 45 year old female who  presents with neck, bilateral upper extremity pain, paresthesias in the  radial three digits of both hands.  Right hand greater than left, she is  right-hand dominant.  She underwent evaluation.  The results of her MRI  study demonstrate mild spondylosis changes throughout the cervical  spinal but no significant area of focal nerve root entrapment,  especially on the right side on the patient's EMGs and nerve conduction  studies.  Clinical examination is consistent with bilateral carpal  tunnel syndrome with positive Tinel sign at the wrist and Phalen sign at  less than 10 seconds both sides, right side greater than the left.  The  patient's EMG and nerve conduction study demonstrate focal and median  nerve compression bilaterally at the wrist, right moderate in severity  and left mild.  The right side involving  both sensory and motor fibers  at the time of evaluation.  After conservative management, the patient  was felt to be a surgical candidate.  She has been experiencing symptoms  of progressive numbness over the past one year.  She is brought to the  operating room to undergo a right carpal tunnel release, open.   INTRAOPERATIVE FINDINGS:  The patient had a thickened, fibrotic  transverse carpal ligament with compression upon the median nerve  transversely at the level of the transverse carpal ligament.  Waste-like  constriction diminished blood flow prior to release.  Hyperemia  following release.   DESCRIPTION OF PROCEDURE:  After adequate general anesthesia via GOO,  the patient had a right upper extremity tourniquet about the right arm,  prepped with DuraPrep solution from fingertips to upper arm.  Draped in  the usual manner.  The patient had elevation of the right arm,  exsanguination with an Esmarch bandage.  The tourniquet inflated to 275  mmHg.  An incision ulnar to the thenar crease base approximately 1-1/2  inch in length crossing in a  diagonal fashion across the distal wrist  creases ulnarward.  Through the skin and subcutaneous layers after  infiltration of Marcaine 0.5% plain.  The patient had the incision made  using a 15-blade scalpel through the subcutaneous layers and fat down to  the superficial fascia overlying the distal forearm and wrist and the  transverse carpal ligament distally.  A small Weitlaner inserted.  The  thickened palmar fascia over the distal forearm was carefully elevated  with a forceps and then divided using Metzenbaums and then identified  the median nerve.  The nerve was protected and then the fascia was  incised over the distal portion of the forearm in line with the  patient's nerve ulnar aspect.  Care was taken to tunnel both superficial  and deep to the fascia in order to release it.  With this completed,  then the Ragnell retractor was placed  underneath the skin to further  retract and allow for release proximally and distally holding the skin  upwards using headlight and loop magnification.  Then the transverse  carpal ligament was divided overlying the median nerve using a freer  elevator placed over the nerve, dividing with a 15-blade scalpel, then  Metzenbaum scissors used to divide the palmar fascia out into the  proximal portions of the palm releasing it out to the transverse  superficial palmar arch.  This was identified and preserved.  The motor  branch of the median nerve identified and preserved.  It was felt to be  in good condition.   Irrigation was carried out.  The tourniquet was released.  The total  tourniquet time was 9 minutes.  Bleeders were then controlled using  bipolar electrocautery.  The tourniquet was removed from the arm.  This  patient did have some small amount of venous bleeding.  This stopped  bleeding immediately.  When all bleeding was controlled, then the  incision was closed by closing the skin using vertical mattress sutures  proximally and then horizontal mattress sutures through the mid portion  of the incision over the skin creases, distally, a single simple suture.  Adaptic 4 x 4's and an ABD pad affixed to the volar aspect of the distal  forearm and wrist area using sterile Webril.  A volar splint then  applied to the wrist, pulling the wrist in 25 degrees of dorsiflexion.  The patient was then reactivated, extubated and returned to the recovery  room in satisfactory condition.  All instrument and sponge counts were  correct.   POSTOPERATIVE CARE:  The patient will elevate the arm, using ice  locally.  She will use hydrocodone for discomfort and Motrin, to be seen  back in the office in 2 weeks for removal of sutures.      Kerrin Champagne, M.D.  Electronically Signed     JEN/MEDQ  D:  12/09/2006  T:  12/09/2006  Job:  [id number]

## 2011-02-15 ENCOUNTER — Other Ambulatory Visit: Payer: Self-pay | Admitting: Specialist

## 2011-02-15 DIAGNOSIS — M25551 Pain in right hip: Secondary | ICD-10-CM

## 2011-02-15 DIAGNOSIS — M2560 Stiffness of unspecified joint, not elsewhere classified: Secondary | ICD-10-CM

## 2011-02-25 ENCOUNTER — Other Ambulatory Visit: Payer: Self-pay | Admitting: Specialist

## 2011-02-25 DIAGNOSIS — M2560 Stiffness of unspecified joint, not elsewhere classified: Secondary | ICD-10-CM

## 2011-02-25 DIAGNOSIS — M25551 Pain in right hip: Secondary | ICD-10-CM

## 2011-03-03 ENCOUNTER — Ambulatory Visit
Admission: RE | Admit: 2011-03-03 | Discharge: 2011-03-03 | Disposition: A | Discharge status: Home / Self Care | Payer: Medicare Other | Source: Ambulatory Visit | Attending: Specialist | Admitting: Specialist

## 2011-03-03 DIAGNOSIS — M2560 Stiffness of unspecified joint, not elsewhere classified: Secondary | ICD-10-CM

## 2011-03-03 DIAGNOSIS — M25551 Pain in right hip: Secondary | ICD-10-CM

## 2011-04-01 ENCOUNTER — Encounter: Payer: Medicare Other | Attending: Internal Medicine | Admitting: *Deleted

## 2011-04-01 DIAGNOSIS — Z713 Dietary counseling and surveillance: Secondary | ICD-10-CM | POA: Insufficient documentation

## 2011-04-01 DIAGNOSIS — E119 Type 2 diabetes mellitus without complications: Secondary | ICD-10-CM | POA: Insufficient documentation

## 2011-04-02 NOTE — Patient Instructions (Signed)
Patient will attend [facility] Diabetes Courses as scheduled or follow up prn.  

## 2011-04-02 NOTE — Progress Notes (Signed)
  Patient was seen on 04/01/11 for the first of a series of three diabetes self-management courses at the [facility]. The following learning objectives were met by the patient during this course:   Defines diabetes and the role of insulin  Identifies type of diabetes and pathophysiology  States normal BG range and personal goals  Identifies three risk factors for the development of diabetes  States the need for and frequency of healthcare follow up (ADA Standards of Care)   Patient has established the following initial goals:  Work on stress levels  Follow diabetes meal plan  Stop smoking  Lose weight  Follow-Up Plan: Pt to follow up at [facility] for core diabetes courses in September 2012

## 2011-04-23 ENCOUNTER — Ambulatory Visit: Payer: Medicare Other

## 2011-04-30 ENCOUNTER — Ambulatory Visit: Payer: Medicare Other

## 2011-05-02 ENCOUNTER — Encounter: Payer: Self-pay | Admitting: Dietician

## 2011-05-09 LAB — URINALYSIS, ROUTINE W REFLEX MICROSCOPIC
Glucose, UA: 100 — AB
Leukocytes, UA: NEGATIVE
Nitrite: NEGATIVE
Specific Gravity, Urine: 1.019
pH: 5.5

## 2011-05-09 LAB — DIFFERENTIAL
Basophils Absolute: 0
Eosinophils Absolute: 0.1
Eosinophils Relative: 1
Lymphocytes Relative: 25
Lymphs Abs: 2.4
Monocytes Absolute: 0.8

## 2011-05-09 LAB — COMPREHENSIVE METABOLIC PANEL
ALT: 23
ALT: 30
AST: 22
Albumin: 3.7
Albumin: 3.9
Alkaline Phosphatase: 105
Calcium: 8.5
Calcium: 9.5
GFR calc Af Amer: >60
GFR calc non Af Amer: >60
Glucose, Bld: 169 — ABNORMAL HIGH
Potassium: 3.7
Sodium: 136
Sodium: 139
Total Bilirubin: 0.5
Total Protein: 6.8
Total Protein: 7.1

## 2011-05-09 LAB — CBC
HCT: 40.1
MCHC: 33.3
MCHC: 33.5
MCV: 82.4
Platelets: 276
Platelets: 284
Platelets: 287
RDW: 14.6
RDW: 14.8
RDW: 14.8

## 2011-05-09 LAB — LIPASE, BLOOD
Lipase: 33
Lipase: 83 — ABNORMAL HIGH

## 2011-05-09 LAB — BASIC METABOLIC PANEL
BUN: 6
Chloride: 104
Creatinine, Ser: 0.61
Glucose, Bld: 146 — ABNORMAL HIGH
Potassium: 3.9

## 2011-05-09 LAB — URINE MICROSCOPIC-ADD ON

## 2011-05-10 LAB — COMPREHENSIVE METABOLIC PANEL
Alkaline Phosphatase: 99
BUN: 8
Glucose, Bld: 117 — ABNORMAL HIGH
Potassium: 3.7
Total Protein: 6.9

## 2011-05-10 LAB — CBC
HCT: 40.6
Hemoglobin: 13.7
MCHC: 33.8
MCV: 82.1
RDW: 15.1

## 2011-05-10 LAB — URINALYSIS, ROUTINE W REFLEX MICROSCOPIC
Leukocytes, UA: NEGATIVE
Nitrite: NEGATIVE
Protein, ur: NEGATIVE
Urobilinogen, UA: 1

## 2011-05-10 LAB — DIFFERENTIAL
Basophils Absolute: 0
Basophils Relative: 0
Monocytes Relative: 7
Neutro Abs: 7.1
Neutrophils Relative %: 70

## 2011-05-10 LAB — URINE MICROSCOPIC-ADD ON

## 2011-05-13 LAB — GLUCOSE, CAPILLARY

## 2011-05-17 LAB — URINALYSIS, ROUTINE W REFLEX MICROSCOPIC
Bilirubin Urine: NEGATIVE
Ketones, ur: 15 mg/dL — AB
Nitrite: NEGATIVE
pH: 5.5 (ref 5.0–8.0)

## 2011-05-17 LAB — HEPATIC FUNCTION PANEL
ALT: 30 U/L (ref 0–35)
AST: 22 U/L (ref 0–37)
Alkaline Phosphatase: 98 U/L (ref 39–117)
Bilirubin, Direct: 0.2 mg/dL (ref 0.0–0.3)
Indirect Bilirubin: 0.6 mg/dL (ref 0.3–0.9)
Total Protein: 7.1 g/dL (ref 6.0–8.3)

## 2011-05-17 LAB — URINE MICROSCOPIC-ADD ON

## 2011-05-17 LAB — POCT I-STAT, CHEM 8
Calcium, Ion: 1.07 mmol/L — ABNORMAL LOW (ref 1.12–1.32)
Chloride: 106 mEq/L (ref 96–112)
Creatinine, Ser: 0.6 mg/dL (ref 0.4–1.2)
Glucose, Bld: 130 mg/dL — ABNORMAL HIGH (ref 70–99)
HCT: 45 % (ref 36.0–46.0)

## 2011-05-17 LAB — CBC
HCT: 44.6 % (ref 36.0–46.0)
Hemoglobin: 14.6 g/dL (ref 12.0–15.0)
WBC: 9.2 10*3/uL (ref 4.0–10.5)

## 2011-05-17 LAB — DIFFERENTIAL
Eosinophils Relative: 1 % (ref 0–5)
Lymphocytes Relative: 14 % (ref 12–46)
Lymphs Abs: 1.3 10*3/uL (ref 0.7–4.0)
Monocytes Absolute: 0.7 10*3/uL (ref 0.1–1.0)

## 2011-05-21 ENCOUNTER — Ambulatory Visit: Payer: Medicare Other

## 2011-05-22 LAB — CBC
HCT: 38.7
HCT: 38.9
HCT: 42.5
Hemoglobin: 13
Hemoglobin: 14.1
MCHC: 33.3
MCV: 83.7
Platelets: 279
RBC: 5.05
RDW: 14.4 — ABNORMAL HIGH
RDW: 14.5 — ABNORMAL HIGH
RDW: 14.9 — ABNORMAL HIGH
WBC: 9.2
WBC: 9.8

## 2011-05-22 LAB — BASIC METABOLIC PANEL
BUN: 10
CO2: 27
Chloride: 102
Glucose, Bld: 161 — ABNORMAL HIGH
Potassium: 3.8

## 2011-05-22 LAB — COMPREHENSIVE METABOLIC PANEL
ALT: 23
ALT: 25
AST: 22
Albumin: 3.7
Alkaline Phosphatase: 112
Alkaline Phosphatase: 96
BUN: 10
BUN: 11
CO2: 24
Chloride: 104
Chloride: 105
Glucose, Bld: 110 — ABNORMAL HIGH
Potassium: 4.2
Potassium: 4.2
Sodium: 137
Sodium: 141
Total Bilirubin: 0.5
Total Bilirubin: 0.5

## 2011-05-22 LAB — DIFFERENTIAL
Basophils Absolute: 0
Basophils Relative: 0
Eosinophils Absolute: 0.1
Neutro Abs: 6.4
Neutrophils Relative %: 66

## 2011-05-22 LAB — CK TOTAL AND CKMB (NOT AT ARMC)
Relative Index: 0.6
Total CK: 187 — ABNORMAL HIGH

## 2011-05-22 LAB — POCT CARDIAC MARKERS: Troponin i, poc: <0.05

## 2011-05-22 LAB — PROTIME-INR: INR: 1.1

## 2011-05-22 LAB — TROPONIN I: Troponin I: 0.01

## 2011-05-22 LAB — TSH: TSH: 0.92

## 2011-05-28 ENCOUNTER — Ambulatory Visit: Payer: Medicare Other

## 2011-09-19 ENCOUNTER — Encounter (HOSPITAL_COMMUNITY): Payer: Self-pay | Admitting: Emergency Medicine

## 2011-09-19 ENCOUNTER — Emergency Department (HOSPITAL_COMMUNITY)
Admission: EM | Admit: 2011-09-19 | Discharge: 2011-09-19 | Disposition: A | Discharge status: Home / Self Care | Payer: Medicare Other | Attending: Emergency Medicine | Admitting: Emergency Medicine

## 2011-09-19 ENCOUNTER — Other Ambulatory Visit: Payer: Self-pay

## 2011-09-19 ENCOUNTER — Emergency Department (HOSPITAL_COMMUNITY): Payer: Medicare Other

## 2011-09-19 DIAGNOSIS — Z79899 Other long term (current) drug therapy: Secondary | ICD-10-CM | POA: Insufficient documentation

## 2011-09-19 DIAGNOSIS — R0602 Shortness of breath: Secondary | ICD-10-CM | POA: Insufficient documentation

## 2011-09-19 DIAGNOSIS — J40 Bronchitis, not specified as acute or chronic: Secondary | ICD-10-CM | POA: Insufficient documentation

## 2011-09-19 DIAGNOSIS — R05 Cough: Secondary | ICD-10-CM | POA: Insufficient documentation

## 2011-09-19 DIAGNOSIS — IMO0001 Reserved for inherently not codable concepts without codable children: Secondary | ICD-10-CM | POA: Insufficient documentation

## 2011-09-19 DIAGNOSIS — R509 Fever, unspecified: Secondary | ICD-10-CM | POA: Insufficient documentation

## 2011-09-19 DIAGNOSIS — R110 Nausea: Secondary | ICD-10-CM | POA: Insufficient documentation

## 2011-09-19 DIAGNOSIS — R079 Chest pain, unspecified: Secondary | ICD-10-CM | POA: Insufficient documentation

## 2011-09-19 DIAGNOSIS — G8929 Other chronic pain: Secondary | ICD-10-CM | POA: Insufficient documentation

## 2011-09-19 DIAGNOSIS — F17200 Nicotine dependence, unspecified, uncomplicated: Secondary | ICD-10-CM | POA: Insufficient documentation

## 2011-09-19 DIAGNOSIS — J3489 Other specified disorders of nose and nasal sinuses: Secondary | ICD-10-CM | POA: Insufficient documentation

## 2011-09-19 DIAGNOSIS — Z9889 Other specified postprocedural states: Secondary | ICD-10-CM | POA: Insufficient documentation

## 2011-09-19 DIAGNOSIS — R059 Cough, unspecified: Secondary | ICD-10-CM | POA: Insufficient documentation

## 2011-09-19 DIAGNOSIS — E119 Type 2 diabetes mellitus without complications: Secondary | ICD-10-CM | POA: Insufficient documentation

## 2011-09-19 DIAGNOSIS — I10 Essential (primary) hypertension: Secondary | ICD-10-CM | POA: Insufficient documentation

## 2011-09-19 HISTORY — DX: Dorsalgia, unspecified: M54.9

## 2011-09-19 HISTORY — DX: Other chronic pain: G89.29

## 2011-09-19 LAB — URINALYSIS, ROUTINE W REFLEX MICROSCOPIC
Ketones, ur: 15 mg/dL — AB
Leukocytes, UA: NEGATIVE
Nitrite: NEGATIVE
Protein, ur: NEGATIVE mg/dL
pH: 6 (ref 5.0–8.0)

## 2011-09-19 LAB — BASIC METABOLIC PANEL
Calcium: 9.2 mg/dL (ref 8.4–10.5)
Chloride: 99 mEq/L (ref 96–112)
Creatinine, Ser: 0.48 mg/dL — ABNORMAL LOW (ref 0.50–1.10)
GFR calc Af Amer: >90 mL/min (ref >90)

## 2011-09-19 LAB — DIFFERENTIAL
Basophils Absolute: 0 10*3/uL (ref 0.0–0.1)
Basophils Relative: 0 % (ref 0–1)
Monocytes Absolute: 0.7 10*3/uL (ref 0.1–1.0)
Neutro Abs: 4.2 10*3/uL (ref 1.7–7.7)

## 2011-09-19 LAB — CBC
HCT: 42.4 % (ref 36.0–46.0)
MCHC: 34.2 g/dL (ref 30.0–36.0)
Platelets: 269 10*3/uL (ref 150–400)
RDW: 13.5 % (ref 11.5–15.5)

## 2011-09-19 LAB — URINE MICROSCOPIC-ADD ON

## 2011-09-19 LAB — D-DIMER, QUANTITATIVE: D-Dimer, Quant: 0.32 ug/mL-FEU (ref 0.00–0.48)

## 2011-09-19 MED ORDER — PREDNISONE 10 MG PO TABS: 60.0000 mg | ORAL_TABLET | Freq: Every day | ORAL | Status: AC

## 2011-09-19 MED ORDER — IPRATROPIUM BROMIDE 0.02 % IN SOLN
0.5000 mg | Freq: Once | RESPIRATORY_TRACT | Status: AC
  Administered 2011-09-19: 0.5 mg via RESPIRATORY_TRACT
  Filled 2011-09-19: qty 2.5

## 2011-09-19 MED ORDER — ALBUTEROL SULFATE (5 MG/ML) 0.5% IN NEBU
5.0000 mg | INHALATION_SOLUTION | Freq: Once | RESPIRATORY_TRACT | Status: AC
  Administered 2011-09-19: 5 mg via RESPIRATORY_TRACT
  Filled 2011-09-19: qty 1

## 2011-09-19 MED ORDER — AEROCHAMBER PLUS W/MASK MISC
1.0000 | Freq: Once | Status: AC
  Administered 2011-09-19: 1
  Filled 2011-09-19: qty 1

## 2011-09-19 MED ORDER — PREDNISONE 20 MG PO TABS
60.0000 mg | ORAL_TABLET | Freq: Once | ORAL | Status: AC
  Administered 2011-09-19: 60 mg via ORAL
  Filled 2011-09-19: qty 3

## 2011-09-19 MED ORDER — ONDANSETRON HCL 4 MG/2ML IJ SOLN
4.0000 mg | Freq: Once | INTRAMUSCULAR | Status: AC
  Administered 2011-09-19: 4 mg via INTRAVENOUS
  Filled 2011-09-19: qty 2

## 2011-09-19 MED ORDER — ALBUTEROL SULFATE HFA 108 (90 BASE) MCG/ACT IN AERS: 2.0000 | INHALATION_SPRAY | RESPIRATORY_TRACT | Status: DC | PRN

## 2011-09-19 NOTE — ED Notes (Signed)
Patient ambulated with pulse ox. Sats remain low 90's-94% on RA.

## 2011-09-19 NOTE — ED Notes (Signed)
Patient informed of need for urine sample. 

## 2011-09-19 NOTE — ED Notes (Signed)
`  1coughing and diarrhea and today coughed up blood  Went to dr and he did xray and was sent here for further tests and also states that she has kidney infection

## 2011-09-19 NOTE — ED Notes (Signed)
Pt sent here by PCP for eval for possible PNA; pt sts URI with cough, cold and fever x several days; pt sts some blood in sputum today; pt sts generalized body aches; per PCP note pt also has blood in urine

## 2011-09-19 NOTE — ED Provider Notes (Signed)
I saw and evaluated the patient, reviewed the resident's note and I agree with the findings and plan.  2 days of cough, congestion, fever, present her symptoms. Sent from PCPs. Chest x-ray clear, diffuse wheezing on exam, no history of asthma. D-dimer negative.  Glynn Octave, MD 09/19/11 2043

## 2011-09-19 NOTE — ED Notes (Signed)
Patient states she started to have chest pain yesterday and SOB when walking around. Patient states chest congestion and  Cough and chest hurts when she coughs. Patient denies N/V. Patient states she is diabetic.

## 2011-09-19 NOTE — ED Notes (Signed)
Patient resting with NAD at this time. Patient remains on monitor and oxygen sats are 98% on RA. Family at bedside.

## 2011-09-19 NOTE — ED Notes (Addendum)
Patient states she feel nauseated and SOB. Patient placed monitor and 2L oxygen with saturation of 99%. EDP notified.

## 2011-09-19 NOTE — ED Notes (Signed)
Patient sleeping. Patient remains on monitor and sats are 94% RA. Family at bedside.

## 2011-09-19 NOTE — ED Provider Notes (Signed)
History     CSN: [id number]  Arrival date & time 09/19/11  1105   First MD Initiated Contact with Patient 09/19/11 1355      Chief Complaint  Patient presents with  . Cough  . Fever  . URI    (Consider location/radiation/quality/duration/timing/severity/associated sxs/prior treatment) Patient is a 46 y.o. female presenting with cough, fever, and URI. The history is provided by the patient.  Cough This is a new problem. The current episode started 2 days ago. The problem has been gradually worsening. The cough is productive of blood-tinged sputum. Maximum temperature: subjective. The fever has been present for 1 to 2 days. Associated symptoms include chest pain (when coughing), chills, myalgias, shortness of breath and wheezing. Pertinent negatives include no ear pain, no headaches and no sore throat. Treatments tried: Mucinex. The treatment provided mild relief. She is a smoker.  Fever Primary symptoms of the febrile illness include fever, cough, wheezing, shortness of breath, nausea and myalgias. Primary symptoms do not include headaches, abdominal pain, vomiting or diarrhea.  URI The primary symptoms include fever, cough, wheezing, nausea and myalgias. Primary symptoms do not include headaches, ear pain, sore throat, abdominal pain or vomiting.  Symptoms associated with the illness include chills and congestion.    Past Medical History  Diagnosis Date  . Diabetes mellitus   . Hypertension   . Chronic back pain     Past Surgical History  Procedure Date  . Abdominal hysterectomy   . Cesarean section   . Frontal fusion   . Carpal tunnel release   . Bone graft hip iliac crest   . Neck fusion     patient reported  . Back surgery     History reviewed. No pertinent family history.  History  Substance Use Topics  . Smoking status: Current Everyday Smoker  . Smokeless tobacco: Not on file  . Alcohol Use: No    OB History    Grav Para Term Preterm Abortions TAB SAB Ect  Mult Living                  Review of Systems  Constitutional: Positive for fever and chills.  HENT: Positive for congestion. Negative for ear pain and sore throat.   Respiratory: Positive for cough, shortness of breath and wheezing.   Cardiovascular: Positive for chest pain (when coughing).  Gastrointestinal: Positive for nausea. Negative for vomiting, abdominal pain and diarrhea.  Genitourinary: Negative for difficulty urinating.  Musculoskeletal: Positive for myalgias.  Neurological: Negative for headaches.  All other systems reviewed and are negative.    Allergies  Review of patient's allergies indicates no known allergies.  Home Medications   Current Outpatient Rx  Name Route Sig Dispense Refill  . ALPRAZOLAM 0.25 MG PO TABS Oral Take 0.25 mg by mouth at bedtime as needed. For anxiety    . CYCLOBENZAPRINE HCL 5 MG PO TABS Oral Take 5 mg by mouth 3 (three) times daily as needed. For muscle spasms.    . FUROSEMIDE 20 MG PO TABS Oral Take 20 mg by mouth daily.     . IBUPROFEN 200 MG PO TABS Oral Take 400 mg by mouth 4 (four) times daily as needed. For pain    . MELOXICAM 15 MG PO TABS Oral Take 15 mg by mouth daily.      Marland Kitchen METFORMIN HCL 500 MG PO TABS Oral Take 500 mg by mouth 2 (two) times daily with a meal.    . METOPROLOL TARTRATE 25 MG PO  TABS Oral Take 25 mg by mouth daily.    Marland Kitchen FISH OIL PO Oral Take 1 capsule by mouth daily.    Marland Kitchen SIMVASTATIN 20 MG PO TABS Oral Take 20 mg by mouth at bedtime.        BP 120/68  Pulse 88  Temp(Src) 98.6 F (37 C) (Oral)  Resp 16  SpO2 98%  Physical Exam  Nursing note and vitals reviewed. Constitutional: She is oriented to person, place, and time. She appears well-developed and well-nourished. No distress.  HENT:  Head: Normocephalic and atraumatic.  Mouth/Throat: Oropharynx is clear and moist.  Eyes: Conjunctivae are normal. Pupils are equal, round, and reactive to light. No scleral icterus.  Neck: Neck supple.    Cardiovascular: Normal rate, regular rhythm, normal heart sounds and intact distal pulses.   No murmur heard. Pulmonary/Chest: Effort normal. No stridor. No respiratory distress. She has wheezes (decreased air movement and wheezing throughout). She has no rales.  Abdominal: Soft. Bowel sounds are normal. She exhibits no distension. There is no tenderness.  Musculoskeletal: Normal range of motion. She exhibits no edema and no tenderness.  Neurological: She is alert and oriented to person, place, and time.  Skin: Skin is warm and dry. No rash noted.  Psychiatric: She has a normal mood and affect. Her behavior is normal.    ED Course  Procedures (including critical care time)  Labs Reviewed  BASIC METABOLIC PANEL - Abnormal; Notable for the following:    Glucose, Bld 129 (*)    Creatinine, Ser 0.48 (*)    All other components within normal limits  URINALYSIS, ROUTINE W REFLEX MICROSCOPIC - Abnormal; Notable for the following:    APPearance CLOUDY (*)    Hgb urine dipstick SMALL (*)    Ketones, ur 15 (*)    All other components within normal limits  URINE MICROSCOPIC-ADD ON - Abnormal; Notable for the following:    Squamous Epithelial / LPF MANY (*)    Bacteria, UA MANY (*)    All other components within normal limits  CBC  DIFFERENTIAL  D-DIMER, QUANTITATIVE  D-DIMER, QUANTITATIVE   Dg Chest 2 View  09/19/2011  *RADIOLOGY REPORT*  Clinical Data: History of cough and fever.  Chest pain.  CHEST - 2 VIEW  Comparison: Chest x-ray 12/27/2009.  Findings: Lung volumes are normal.  No consolidative airspace disease.  No pleural effusions.  No pneumothorax.  No pulmonary nodule or mass noted.  Pulmonary vasculature and the cardiomediastinal silhouette are within normal limits.  Cervical fixation hardware now noted within the lower cervical spine.  IMPRESSION:  1.  No radiographic evidence of acute cardiopulmonary disease.  Original Report Authenticated By: Florencia Reasons, M.D.   All  radiology studies independently viewed by me.      Date: 09/19/2011  Rate: 90  Rhythm: normal sinus rhythm  QRS Axis: normal  Intervals: normal  ST/T Wave abnormalities: nonspecific T wave changes  Conduction Disutrbances:none  Narrative Interpretation:   Old EKG Reviewed: unchanged     1. Bronchitis       MDM  46 yo female presenting from her PCP's office with 2 days of fever, cough, and shortness of breath.  Afebrile on arrival, VSS.  Non-toxic, non-septic.  States she was sent from her PCP because her lab tests "looked bad" and she had blood in her urine.  Her CXR here is clear, without signs of pneumonia or volume overload.  No leukocytosis.  BMP pending.  Breathing treatments given for wheezing.  Low pre-test prob for PE and D-dimer negative.  Labwork unremarkable.  Small Hg in urine and no RBCs.  However, no history to suggest Rhabdo and kidney fxn normal.  Will follow up with her PCP.  Also no urinary symptoms.  Ambulated without desaturating.  Lungs sound improved after albuterol treatments.  Will dc home with prednisone and PCP follow up.          Warnell Forester, MD 09/19/11 1958  Warnell Forester, MD 09/19/11 [phone]

## 2011-09-19 NOTE — ED Notes (Signed)
Patient resting and remains on monitor and 2L oxygen with sats of 98%. Family at bedside.

## 2011-11-13 ENCOUNTER — Other Ambulatory Visit: Payer: Self-pay | Admitting: Internal Medicine

## 2011-11-13 DIAGNOSIS — Z1231 Encounter for screening mammogram for malignant neoplasm of breast: Secondary | ICD-10-CM

## 2011-12-09 ENCOUNTER — Encounter (HOSPITAL_COMMUNITY): Payer: Self-pay | Admitting: Emergency Medicine

## 2011-12-09 ENCOUNTER — Emergency Department (HOSPITAL_COMMUNITY)
Admission: EM | Admit: 2011-12-09 | Discharge: 2011-12-10 | Disposition: A | Discharge status: Home / Self Care | Payer: Medicare Other | Attending: Emergency Medicine | Admitting: Emergency Medicine

## 2011-12-09 ENCOUNTER — Ambulatory Visit: Payer: Medicare Other

## 2011-12-09 DIAGNOSIS — R109 Unspecified abdominal pain: Secondary | ICD-10-CM | POA: Insufficient documentation

## 2011-12-09 DIAGNOSIS — I10 Essential (primary) hypertension: Secondary | ICD-10-CM | POA: Insufficient documentation

## 2011-12-09 DIAGNOSIS — R112 Nausea with vomiting, unspecified: Secondary | ICD-10-CM | POA: Insufficient documentation

## 2011-12-09 DIAGNOSIS — G8929 Other chronic pain: Secondary | ICD-10-CM | POA: Insufficient documentation

## 2011-12-09 DIAGNOSIS — Z79899 Other long term (current) drug therapy: Secondary | ICD-10-CM | POA: Insufficient documentation

## 2011-12-09 DIAGNOSIS — K861 Other chronic pancreatitis: Secondary | ICD-10-CM | POA: Insufficient documentation

## 2011-12-09 DIAGNOSIS — E119 Type 2 diabetes mellitus without complications: Secondary | ICD-10-CM | POA: Insufficient documentation

## 2011-12-09 LAB — RAPID URINE DRUG SCREEN, HOSP PERFORMED
Amphetamines: NOT DETECTED
Opiates: NOT DETECTED

## 2011-12-09 LAB — COMPREHENSIVE METABOLIC PANEL
Albumin: 4.2 g/dL (ref 3.5–5.2)
Alkaline Phosphatase: 130 U/L — ABNORMAL HIGH (ref 39–117)
BUN: 9 mg/dL (ref 6–23)
Chloride: 101 mEq/L (ref 96–112)
Creatinine, Ser: 0.63 mg/dL (ref 0.50–1.10)
GFR calc Af Amer: >90 mL/min (ref >90)
Glucose, Bld: 123 mg/dL — ABNORMAL HIGH (ref 70–99)
Potassium: 3.9 mEq/L (ref 3.5–5.1)
Total Bilirubin: 0.3 mg/dL (ref 0.3–1.2)
Total Protein: 7.6 g/dL (ref 6.0–8.3)

## 2011-12-09 LAB — CBC
HCT: 43.2 % (ref 36.0–46.0)
Hemoglobin: 15.2 g/dL — ABNORMAL HIGH (ref 12.0–15.0)
MCHC: 35.2 g/dL (ref 30.0–36.0)
MCV: 81.5 fL (ref 78.0–100.0)
RDW: 13.4 % (ref 11.5–15.5)

## 2011-12-09 LAB — LIPASE, BLOOD: Lipase: 93 U/L — ABNORMAL HIGH (ref 11–59)

## 2011-12-09 MED ORDER — ONDANSETRON HCL 4 MG/2ML IJ SOLN
4.0000 mg | Freq: Once | INTRAMUSCULAR | Status: AC
  Administered 2011-12-09: 4 mg via INTRAVENOUS
  Filled 2011-12-09: qty 2

## 2011-12-09 MED ORDER — MORPHINE SULFATE 2 MG/ML IJ SOLN
INTRAMUSCULAR | Status: AC
  Administered 2011-12-09: 2 mg via INTRAVASCULAR
  Filled 2011-12-09: qty 1

## 2011-12-09 MED ORDER — MORPHINE SULFATE 4 MG/ML IJ SOLN
6.0000 mg | INTRAMUSCULAR | Status: DC | PRN
  Administered 2011-12-09 (×2): 4 mg via INTRAVENOUS
  Filled 2011-12-09 (×2): qty 1

## 2011-12-09 MED ORDER — MORPHINE SULFATE 2 MG/ML IJ SOLN
INTRAMUSCULAR | Status: AC
  Administered 2011-12-09: 2 mg via INTRAVENOUS
  Filled 2011-12-09: qty 1

## 2011-12-09 NOTE — ED Provider Notes (Signed)
History     CSN: [id number]  Arrival date & time 12/09/11  1925   First MD Initiated Contact with Patient 12/09/11 2045      Chief Complaint  Patient presents with  . Pancreatitis    (Consider location/radiation/quality/duration/timing/severity/associated sxs/prior treatment) HPI Comments: Patient has a history of having had chronic pancreatitis as a result of a pneumonia.  For the past 3 years.  She was seen by her PCP 1 week ago given Nexium and, antimanic she's not been having any pain relief.  She continues to vomit.  She was told by her primary care provider to come to the emergency room.  She was getting significant pain relief  The history is provided by the patient.    Past Medical History  Diagnosis Date  . Diabetes mellitus   . Hypertension   . Chronic back pain     Past Surgical History  Procedure Date  . Abdominal hysterectomy   . Cesarean section   . Frontal fusion   . Carpal tunnel release   . Bone graft hip iliac crest   . Neck fusion     patient reported  . Back surgery     No family history on file.  History  Substance Use Topics  . Smoking status: Current Everyday Smoker  . Smokeless tobacco: Not on file  . Alcohol Use: No    OB History    Grav Para Term Preterm Abortions TAB SAB Ect Mult Living                  Review of Systems  Constitutional: Negative for fever and chills.  Respiratory: Negative for cough and shortness of breath.   Cardiovascular: Negative for chest pain.  Gastrointestinal: Positive for nausea, vomiting and abdominal pain. Negative for abdominal distention.  Genitourinary: Negative for dysuria and flank pain.  Neurological: Negative for dizziness and weakness.    Allergies  Review of patient's allergies indicates no known allergies.  Home Medications   Current Outpatient Rx  Name Route Sig Dispense Refill  . ALBUTEROL SULFATE HFA 108 (90 BASE) MCG/ACT IN AERS Inhalation Inhale 2 puffs into the lungs every 4  (four) hours as needed. For wheezing    . ALPRAZOLAM 0.25 MG PO TABS Oral Take 0.25 mg by mouth at bedtime as needed. For anxiety    . CYCLOBENZAPRINE HCL 5 MG PO TABS Oral Take 5 mg by mouth 3 (three) times daily as needed. For muscle spasms.    . FUROSEMIDE 20 MG PO TABS Oral Take 20 mg by mouth daily.     Marland Kitchen METFORMIN HCL 500 MG PO TABS Oral Take 500 mg by mouth 2 (two) times daily with a meal.    . METOPROLOL TARTRATE 25 MG PO TABS Oral Take 25 mg by mouth daily. Time taken unknown    . SIMVASTATIN 20 MG PO TABS Oral Take 20 mg by mouth at bedtime.      . OXYCODONE HCL 5 MG PO TABS Oral Take 1 tablet (5 mg total) by mouth every 4 (four) hours as needed for pain. 20 tablet 0  . PROMETHAZINE HCL 25 MG PO TABS Oral Take 0.5 tablets (12.5 mg total) by mouth every 6 (six) hours as needed for nausea. 30 tablet 0    BP 117/71  Pulse 83  Temp(Src) 98.4 F (36.9 C) (Oral)  Resp 16  SpO2 94%  Physical Exam  Constitutional: She is oriented to person, place, and time. She appears well-developed and well-nourished.  HENT:  Head: Normocephalic.  Eyes: Pupils are equal, round, and reactive to light.  Neck: Normal range of motion.  Cardiovascular: Normal rate.   Pulmonary/Chest: Effort normal.  Abdominal: Bowel sounds are normal. She exhibits no distension. There is tenderness in the left upper quadrant. There is guarding.    Musculoskeletal: Normal range of motion.  Neurological: She is alert and oriented to person, place, and time.  Skin: Skin is warm and dry.    ED Course  Procedures (including critical care time)  Labs Reviewed  CBC - Abnormal; Notable for the following:    WBC 11.4 (*)    RBC 5.30 (*)    Hemoglobin 15.2 (*)    All other components within normal limits  COMPREHENSIVE METABOLIC PANEL - Abnormal; Notable for the following:    Glucose, Bld 123 (*)    Alkaline Phosphatase 130 (*)    All other components within normal limits  LIPASE, BLOOD - Abnormal; Notable for the  following:    Lipase 93 (*)    All other components within normal limits  URINE RAPID DRUG SCREEN (HOSP PERFORMED) - Abnormal; Notable for the following:    Tetrahydrocannabinol POSITIVE (*)    All other components within normal limits   No results found.   1. Chronic pancreatitis       MDM  Recurrent left upper quadrant pain with a history of pancreatitis.  Now having vomiting as well.  This episode has been going on for one week without relief  12:54 AM patient is comfortable, no longer, nauseated.  We'll try by mouth challenge      Arman Filter, NP 12/10/11 [phone]

## 2011-12-09 NOTE — ED Notes (Signed)
Pt went to [facility] this week and was dx with pancreatitis this week but been throwing up when takes pain pills. Reports she needs pain management.

## 2011-12-10 MED ORDER — OXYCODONE HCL 5 MG PO TABS: 5.0000 mg | ORAL_TABLET | ORAL | Status: AC | PRN

## 2011-12-10 MED ORDER — ONDANSETRON HCL 4 MG/2ML IJ SOLN
4.0000 mg | Freq: Once | INTRAMUSCULAR | Status: AC
  Administered 2011-12-10: 4 mg via INTRAVENOUS
  Filled 2011-12-10: qty 2

## 2011-12-10 MED ORDER — PROMETHAZINE HCL 25 MG PO TABS: 12.5000 mg | ORAL_TABLET | Freq: Four times a day (QID) | ORAL | Status: DC | PRN

## 2011-12-10 NOTE — Discharge Instructions (Signed)
Try to follow a clear liquid diet for the next 24 hours use the pain medication and antiemetic as needed.  Followup with Dr. Concepcion Elk

## 2011-12-11 NOTE — ED Provider Notes (Signed)
Medical screening examination/treatment/procedure(s) were performed by non-physician practitioner and as supervising physician I was immediately available for consultation/collaboration.  Ethelda Chick, MD 12/11/11 [phone]

## 2011-12-24 ENCOUNTER — Other Ambulatory Visit: Payer: Self-pay | Admitting: Gastroenterology

## 2011-12-24 ENCOUNTER — Ambulatory Visit
Admission: RE | Admit: 2011-12-24 | Discharge: 2011-12-24 | Disposition: A | Discharge status: Home / Self Care | Payer: Medicare Other | Source: Ambulatory Visit | Attending: Gastroenterology | Admitting: Gastroenterology

## 2011-12-24 MED ORDER — IOHEXOL 300 MG/ML  SOLN
100.0000 mL | Freq: Once | INTRAMUSCULAR | Status: AC | PRN
  Administered 2011-12-24: 125 mL via INTRAVENOUS

## 2012-06-12 ENCOUNTER — Ambulatory Visit
Admission: RE | Admit: 2012-06-12 | Discharge: 2012-06-12 | Disposition: A | Discharge status: Home / Self Care | Payer: Medicare Other | Source: Ambulatory Visit | Attending: Internal Medicine | Admitting: Internal Medicine

## 2012-06-12 DIAGNOSIS — Z1231 Encounter for screening mammogram for malignant neoplasm of breast: Secondary | ICD-10-CM

## 2012-06-17 ENCOUNTER — Other Ambulatory Visit: Payer: Self-pay | Admitting: Internal Medicine

## 2012-06-17 DIAGNOSIS — R928 Other abnormal and inconclusive findings on diagnostic imaging of breast: Secondary | ICD-10-CM

## 2012-06-22 ENCOUNTER — Ambulatory Visit: Payer: Medicare Other | Attending: Internal Medicine

## 2012-06-22 DIAGNOSIS — M542 Cervicalgia: Secondary | ICD-10-CM | POA: Insufficient documentation

## 2012-06-22 DIAGNOSIS — M2569 Stiffness of other specified joint, not elsewhere classified: Secondary | ICD-10-CM | POA: Insufficient documentation

## 2012-06-22 DIAGNOSIS — R209 Unspecified disturbances of skin sensation: Secondary | ICD-10-CM | POA: Insufficient documentation

## 2012-06-22 DIAGNOSIS — IMO0001 Reserved for inherently not codable concepts without codable children: Secondary | ICD-10-CM | POA: Insufficient documentation

## 2012-06-24 ENCOUNTER — Ambulatory Visit
Admission: RE | Admit: 2012-06-24 | Discharge: 2012-06-24 | Disposition: A | Discharge status: Home / Self Care | Payer: Medicare Other | Source: Ambulatory Visit | Attending: Internal Medicine | Admitting: Internal Medicine

## 2012-06-24 ENCOUNTER — Other Ambulatory Visit: Payer: Self-pay | Admitting: Internal Medicine

## 2012-06-24 DIAGNOSIS — R928 Other abnormal and inconclusive findings on diagnostic imaging of breast: Secondary | ICD-10-CM

## 2012-06-24 DIAGNOSIS — N6310 Unspecified lump in the right breast, unspecified quadrant: Secondary | ICD-10-CM

## 2012-06-29 ENCOUNTER — Ambulatory Visit: Payer: Medicare Other

## 2012-07-06 ENCOUNTER — Other Ambulatory Visit: Payer: Self-pay | Admitting: Internal Medicine

## 2012-07-06 ENCOUNTER — Ambulatory Visit
Admission: RE | Admit: 2012-07-06 | Discharge: 2012-07-06 | Disposition: A | Discharge status: Home / Self Care | Payer: Medicare Other | Source: Ambulatory Visit | Attending: Internal Medicine | Admitting: Internal Medicine

## 2012-07-06 ENCOUNTER — Ambulatory Visit: Payer: Medicare Other

## 2012-07-06 DIAGNOSIS — N6310 Unspecified lump in the right breast, unspecified quadrant: Secondary | ICD-10-CM

## 2012-07-23 ENCOUNTER — Emergency Department (INDEPENDENT_AMBULATORY_CARE_PROVIDER_SITE_OTHER)
Admission: EM | Admit: 2012-07-23 | Discharge: 2012-07-23 | Disposition: A | Discharge status: Home / Self Care | Payer: Medicare Other | Source: Home / Self Care | Attending: Emergency Medicine | Admitting: Emergency Medicine

## 2012-07-23 ENCOUNTER — Encounter (HOSPITAL_COMMUNITY): Payer: Self-pay | Admitting: *Deleted

## 2012-07-23 DIAGNOSIS — L089 Local infection of the skin and subcutaneous tissue, unspecified: Secondary | ICD-10-CM

## 2012-07-23 MED ORDER — SULFAMETHOXAZOLE-TRIMETHOPRIM 800-160 MG PO TABS: 1.0000 | ORAL_TABLET | Freq: Two times a day (BID) | ORAL | Status: DC

## 2012-07-23 NOTE — ED Notes (Signed)
Pt  Reports pain  Bridge  Of  Nose     With   Some  Swelling  As  Well  To  The  Face       X  2  Days   Tender  Swollen  r  Cervical  Lymph  Node  As   Well  - pt  denys  Any  Injury  No  New  meds  Or  Any  Known causative  Agents

## 2012-07-23 NOTE — ED Provider Notes (Signed)
Medical screening examination/treatment/procedure(s) were performed by non-physician practitioner and as supervising physician I was immediately available for consultation/collaboration.  Juniper Cobey, M.D.   Rozena Fierro C Jhoanna Heyde, MD 07/23/12 2247 

## 2012-07-23 NOTE — ED Provider Notes (Signed)
History     CSN: [id number]  Arrival date & time 07/23/12  1238   First MD Initiated Contact with Patient 07/23/12 1349      Chief Complaint  Patient presents with  . Facial Swelling    (Consider location/radiation/quality/duration/timing/severity/associated sxs/prior treatment) Patient is a 46 y.o. female presenting with facial injury. The history is provided by the patient. No language interpreter was used.  Facial Injury  The incident occurred today. There is an injury to the nose. The pain is moderate. There is no possibility that she inhaled smoke. There were no sick contacts. She has received no recent medical care.   Pt complains of swelling to the right side of face.  Pt denies any injury, no pimples,  No toothache Past Medical History  Diagnosis Date  . Diabetes mellitus   . Hypertension   . Chronic back pain     Past Surgical History  Procedure Date  . Abdominal hysterectomy   . Cesarean section   . Frontal fusion   . Carpal tunnel release   . Bone graft hip iliac crest   . Neck fusion     patient reported  . Back surgery     No family history on file.  History  Substance Use Topics  . Smoking status: Current Every Day Smoker  . Smokeless tobacco: Not on file  . Alcohol Use: No    OB History    Grav Para Term Preterm Abortions TAB SAB Ect Mult Living                  Review of Systems  HENT: Positive for facial swelling.   All other systems reviewed and are negative.    Allergies  Review of patient's allergies indicates no known allergies.  Home Medications   Current Outpatient Rx  Name  Route  Sig  Dispense  Refill  . ALBUTEROL SULFATE HFA 108 (90 BASE) MCG/ACT IN AERS   Inhalation   Inhale 2 puffs into the lungs every 4 (four) hours as needed. For wheezing         . ALPRAZOLAM 0.25 MG PO TABS   Oral   Take 0.25 mg by mouth at bedtime as needed. For anxiety         . CYCLOBENZAPRINE HCL 5 MG PO TABS   Oral   Take 5 mg by  mouth 3 (three) times daily as needed. For muscle spasms.         . FUROSEMIDE 20 MG PO TABS   Oral   Take 20 mg by mouth daily.          Marland Kitchen METFORMIN HCL 500 MG PO TABS   Oral   Take 500 mg by mouth 2 (two) times daily with a meal.         . METOPROLOL TARTRATE 25 MG PO TABS   Oral   Take 25 mg by mouth daily. Time taken unknown         . PROMETHAZINE HCL 25 MG PO TABS   Oral   Take 0.5 tablets (12.5 mg total) by mouth every 6 (six) hours as needed for nausea.   30 tablet   0   . SIMVASTATIN 20 MG PO TABS   Oral   Take 20 mg by mouth at bedtime.             BP 141/81  Pulse 109  Temp 98.1 F (36.7 C) (Oral)  Resp 16  SpO2 100%  Physical Exam  Nursing note and vitals reviewed. Constitutional: She appears well-developed and well-nourished.  HENT:  Head: Atraumatic.  Eyes: Conjunctivae normal and EOM are normal. Pupils are equal, round, and reactive to light.  Neck: Normal range of motion.  Cardiovascular: Normal rate and regular rhythm.   Pulmonary/Chest: Effort normal.  Musculoskeletal: Normal range of motion.  Neurological: She is alert.  Skin: Skin is warm.  Psychiatric: She has a normal mood and affect.    ED Course  Procedures (including critical care time)  Labs Reviewed - No data to display No results found.   No diagnosis found.    MDM    Bactrim ds  And hydrocodone      [name], [name] 07/23/12 1439

## 2012-09-13 ENCOUNTER — Emergency Department (HOSPITAL_COMMUNITY)
Admission: EM | Admit: 2012-09-13 | Discharge: 2012-09-13 | Disposition: A | Discharge status: Home / Self Care | Payer: Medicare Other | Attending: Emergency Medicine | Admitting: Emergency Medicine

## 2012-09-13 ENCOUNTER — Encounter (HOSPITAL_COMMUNITY): Payer: Self-pay | Admitting: *Deleted

## 2012-09-13 DIAGNOSIS — Z8679 Personal history of other diseases of the circulatory system: Secondary | ICD-10-CM | POA: Insufficient documentation

## 2012-09-13 DIAGNOSIS — K089 Disorder of teeth and supporting structures, unspecified: Secondary | ICD-10-CM | POA: Insufficient documentation

## 2012-09-13 DIAGNOSIS — G8929 Other chronic pain: Secondary | ICD-10-CM | POA: Insufficient documentation

## 2012-09-13 DIAGNOSIS — F17200 Nicotine dependence, unspecified, uncomplicated: Secondary | ICD-10-CM | POA: Insufficient documentation

## 2012-09-13 DIAGNOSIS — Z79899 Other long term (current) drug therapy: Secondary | ICD-10-CM | POA: Insufficient documentation

## 2012-09-13 DIAGNOSIS — K0889 Other specified disorders of teeth and supporting structures: Secondary | ICD-10-CM

## 2012-09-13 DIAGNOSIS — E119 Type 2 diabetes mellitus without complications: Secondary | ICD-10-CM | POA: Insufficient documentation

## 2012-09-13 DIAGNOSIS — I10 Essential (primary) hypertension: Secondary | ICD-10-CM | POA: Insufficient documentation

## 2012-09-13 DIAGNOSIS — Z8719 Personal history of other diseases of the digestive system: Secondary | ICD-10-CM | POA: Insufficient documentation

## 2012-09-13 HISTORY — DX: Acute pancreatitis without necrosis or infection, unspecified: K85.90

## 2012-09-13 MED ORDER — HYDROMORPHONE HCL 2 MG PO TABS: 2.0000 mg | ORAL_TABLET | ORAL | Status: DC | PRN

## 2012-09-13 MED ORDER — MORPHINE SULFATE 4 MG/ML IJ SOLN
4.0000 mg | Freq: Once | INTRAMUSCULAR | Status: AC
  Administered 2012-09-13: 4 mg via INTRAMUSCULAR
  Filled 2012-09-13: qty 1

## 2012-09-13 MED ORDER — LORAZEPAM 2 MG/ML IJ SOLN
0.5000 mg | Freq: Once | INTRAMUSCULAR | Status: AC
  Administered 2012-09-13: 0.5 mg via INTRAMUSCULAR
  Filled 2012-09-13: qty 1

## 2012-09-13 MED ORDER — ONDANSETRON 4 MG PO TBDP
4.0000 mg | ORAL_TABLET | Freq: Once | ORAL | Status: AC
  Administered 2012-09-13: 4 mg via ORAL
  Filled 2012-09-13: qty 4

## 2012-09-13 MED ORDER — HYDROMORPHONE HCL 2 MG PO TABS
2.0000 mg | ORAL_TABLET | Freq: Once | ORAL | Status: AC
  Administered 2012-09-13: 2 mg via ORAL
  Filled 2012-09-13: qty 1

## 2012-09-13 NOTE — ED Provider Notes (Signed)
History   This chart was scribed for Lauren Emery PA-C, a non-physician practitioner working with Doug Sou, MD by Lewanda Rife, ED Scribe. This patient was seen in room TR07C/TR07C and the patient's care was started at 8:30 pm.   CSN: [id number]  Arrival date & time 09/13/12  2003   First MD Initiated Contact with Patient 09/13/12 2015      Chief Complaint  Patient presents with  . Dental Pain    (Consider location/radiation/quality/duration/timing/severity/associated sxs/prior treatment) HPI [name] is a 47 y.o. female who presents to the Emergency Department complaining of constant severe dental pain in upper left portion of the mouth onset 2 weeks. Pt reports she grinds her teeth and cracked her tooth. Pt reports seeing her dentist, but she was hesitant to have her tooth extracted. Patient reports that she developed a Bell's palsy after her last dental procedure which required a local injection she is still having the effects of which and she deferred any extraction because she does not want an injection. Pt reports pain is worsening. Pt reports pain is aggravated with palpation and improved by nothing. Pt reports taking naproxen and ibuprofen at home to treat pain with no relief of symptoms.   Past Medical History  Diagnosis Date  . Diabetes mellitus   . Hypertension   . Chronic back pain   . Pancreatitis     Past Surgical History  Procedure Date  . Abdominal hysterectomy   . Cesarean section   . Frontal fusion   . Carpal tunnel release   . Bone graft hip iliac crest   . Neck fusion     patient reported  . Back surgery     History reviewed. No pertinent family history.  History  Substance Use Topics  . Smoking status: Current Every Day Smoker  . Smokeless tobacco: Not on file  . Alcohol Use: No    OB History    Grav Para Term Preterm Abortions TAB SAB Ect Mult Living                  Review of Systems  Constitutional: Negative.  Negative  for fever.  HENT: Positive for dental problem. Negative for facial swelling.   Respiratory: Negative.   Cardiovascular: Negative.   Gastrointestinal: Negative.   Musculoskeletal: Negative.   Skin: Negative.   Neurological: Negative.   Hematological: Negative.   Psychiatric/Behavioral: Negative.   All other systems reviewed and are negative.   A complete 10 system review of systems was obtained and all systems are negative except as noted in the HPI and PMH.    Allergies  Review of patient's allergies indicates no known allergies.  Home Medications   Current Outpatient Rx  Name  Route  Sig  Dispense  Refill  . ALBUTEROL SULFATE HFA 108 (90 BASE) MCG/ACT IN AERS   Inhalation   Inhale 2 puffs into the lungs every 4 (four) hours as needed. For wheezing         . ALPRAZOLAM 0.25 MG PO TABS   Oral   Take 0.25 mg by mouth at bedtime as needed. For anxiety         . CYCLOBENZAPRINE HCL 5 MG PO TABS   Oral   Take 5 mg by mouth 3 (three) times daily as needed. For muscle spasms.         . FUROSEMIDE 20 MG PO TABS   Oral   Take 20 mg by mouth daily.          Marland Kitchen  METFORMIN HCL 500 MG PO TABS   Oral   Take 500 mg by mouth 2 (two) times daily with a meal.         . METOPROLOL TARTRATE 25 MG PO TABS   Oral   Take 25 mg by mouth daily. Time taken unknown         . PROMETHAZINE HCL 25 MG PO TABS   Oral   Take 0.5 tablets (12.5 mg total) by mouth every 6 (six) hours as needed for nausea.   30 tablet   0   . SIMVASTATIN 20 MG PO TABS   Oral   Take 20 mg by mouth at bedtime.           . SULFAMETHOXAZOLE-TRIMETHOPRIM 800-160 MG PO TABS   Oral   Take 1 tablet by mouth every 12 (twelve) hours.   20 tablet   0     BP 169/108  Pulse 99  Temp 99.7 F (37.6 C) (Oral)  Resp 18  SpO2 97%  Physical Exam  Nursing note and vitals reviewed. Constitutional: She is oriented to person, place, and time. She appears well-developed and well-nourished. No distress.        Pt tearful on exam, appears acutely uncomfortable, clutching her left jaw  HENT:  Head: Normocephalic.  Mouth/Throat: Uvula is midline, oropharynx is clear and moist and mucous membranes are normal.         Left nasal labial fold is not as pronounced as the right. No facial swelling noted.   Eyes: Conjunctivae normal and EOM are normal.  Cardiovascular: Normal rate.   Pulmonary/Chest: Effort normal. No stridor.  Musculoskeletal: Normal range of motion.  Neurological: She is alert and oriented to person, place, and time.  Skin: Skin is warm and dry.  Psychiatric: She has a normal mood and affect.    ED Course  Procedures (including critical care time)  Labs Reviewed - No data to display No results found.   1. Pain, dental       MDM   Physical exam is limited because patient is very uncomfortable and will not allow palpation, no systemic signs of illness or infection.   Patient given a half milligram of Ativan and 4 mg of morphine IM with very minimal reduction in pain. Patient has good outpatient followup she can be with her dentist tomorrow. I will send her home with Dilaudid by mouth. I don't think that there are any complications or systemic issues with her this is a true pain control issue. Return precautions given  Filed Vitals:   09/13/12 2007  BP: 169/108  Pulse: 99  Temp: 99.7 F (37.6 C)  TempSrc: Oral  Resp: 18  SpO2: 97%     Pt verbalized understanding and agrees with care plan. Outpatient follow-up and return precautions given.    New Prescriptions   HYDROMORPHONE (DILAUDID) 2 MG TABLET    Take 1 tablet (2 mg total) by mouth every 4 (four) hours as needed for pain.    I personally performed the services described in this documentation, which was scribed in my presence. The recorded information has been reviewed and is accurate.    Lauren Emery, PA-C 09/13/12 2206

## 2012-09-13 NOTE — ED Notes (Signed)
Pt states she has cracked her top left tooth from grinding her teeth.  Has been referred to dentist but cannot take the pain.

## 2012-09-14 NOTE — ED Provider Notes (Signed)
Medical screening examination/treatment/procedure(s) were performed by non-physician practitioner and as supervising physician I was immediately available for consultation/collaboration.  Idabell Picking, MD 09/14/12 0115 

## 2013-03-26 ENCOUNTER — Emergency Department (HOSPITAL_COMMUNITY): Payer: Medicare Other

## 2013-03-26 ENCOUNTER — Encounter (HOSPITAL_COMMUNITY): Payer: Self-pay | Admitting: *Deleted

## 2013-03-26 ENCOUNTER — Emergency Department (HOSPITAL_COMMUNITY)
Admission: EM | Admit: 2013-03-26 | Discharge: 2013-03-26 | Disposition: A | Discharge status: Home / Self Care | Payer: Medicare Other | Attending: Emergency Medicine | Admitting: Emergency Medicine

## 2013-03-26 DIAGNOSIS — F17200 Nicotine dependence, unspecified, uncomplicated: Secondary | ICD-10-CM | POA: Insufficient documentation

## 2013-03-26 DIAGNOSIS — J40 Bronchitis, not specified as acute or chronic: Secondary | ICD-10-CM

## 2013-03-26 DIAGNOSIS — I10 Essential (primary) hypertension: Secondary | ICD-10-CM | POA: Insufficient documentation

## 2013-03-26 DIAGNOSIS — J209 Acute bronchitis, unspecified: Secondary | ICD-10-CM | POA: Insufficient documentation

## 2013-03-26 DIAGNOSIS — E119 Type 2 diabetes mellitus without complications: Secondary | ICD-10-CM | POA: Insufficient documentation

## 2013-03-26 DIAGNOSIS — M549 Dorsalgia, unspecified: Secondary | ICD-10-CM | POA: Insufficient documentation

## 2013-03-26 DIAGNOSIS — Z8719 Personal history of other diseases of the digestive system: Secondary | ICD-10-CM | POA: Insufficient documentation

## 2013-03-26 DIAGNOSIS — Z79899 Other long term (current) drug therapy: Secondary | ICD-10-CM | POA: Insufficient documentation

## 2013-03-26 DIAGNOSIS — G8929 Other chronic pain: Secondary | ICD-10-CM | POA: Insufficient documentation

## 2013-03-26 DIAGNOSIS — J069 Acute upper respiratory infection, unspecified: Secondary | ICD-10-CM

## 2013-03-26 MED ORDER — GUAIFENESIN 100 MG/5ML PO SOLN
5.0000 mL | Freq: Once | ORAL | Status: AC
  Administered 2013-03-26: 100 mg via ORAL
  Filled 2013-03-26: qty 5

## 2013-03-26 MED ORDER — AZITHROMYCIN 250 MG PO TABS
500.0000 mg | ORAL_TABLET | Freq: Once | ORAL | Status: AC
  Administered 2013-03-26: 500 mg via ORAL
  Filled 2013-03-26: qty 2

## 2013-03-26 MED ORDER — HYDROCODONE-ACETAMINOPHEN 7.5-325 MG/15ML PO SOLN: 15.0000 mL | Freq: Four times a day (QID) | ORAL | Status: DC | PRN

## 2013-03-26 MED ORDER — GUAIFENESIN 100 MG/5ML PO LIQD: 100.0000 mg | ORAL | Status: DC | PRN

## 2013-03-26 MED ORDER — ALBUTEROL SULFATE HFA 108 (90 BASE) MCG/ACT IN AERS
2.0000 | INHALATION_SPRAY | RESPIRATORY_TRACT | Status: DC | PRN
  Administered 2013-03-26: 2 via RESPIRATORY_TRACT
  Filled 2013-03-26: qty 6.7

## 2013-03-26 MED ORDER — AZITHROMYCIN 250 MG PO TABS: 250.0000 mg | ORAL_TABLET | Freq: Every day | ORAL | Status: DC

## 2013-03-26 NOTE — ED Notes (Signed)
The pt has had a cold for one week. She has sinus and chest congestion with a productive cough.  Sputum is yellow and thick.  She has also had a temp.  lmp none

## 2013-03-26 NOTE — ED Provider Notes (Signed)
CSN: [id number]     Arrival date & time 03/26/13  1807 History     First MD Initiated Contact with Patient 03/26/13 2219     Chief Complaint  Patient presents with  . cough cold for one week    (Consider location/radiation/quality/duration/timing/severity/associated sxs/prior Treatment) HPI  47 year old female with history of diabetes, hypertension, and chronic back pain presents complaining of cold symptoms. Patient reports for the past week she has had fever as high as 102.3, chills, headache, nasal congestion, sneezing, cough productive with yellow sputum, sore throat, pleuritic chest pain, occasional shortness of breath, body aches, and decreased appetite. Symptom has been persistent. Patient has tried over-the-counter medication including Tylenol cold and cough, hot tea, Aleve with minimal improvement. She has lost her voice. Her cough has been persistent. She is a smoker and smoked half a pack a day. Report recent sick contact. Denies any long distance travel to endemic region.  Past Medical History  Diagnosis Date  . Diabetes mellitus   . Hypertension   . Chronic back pain   . Pancreatitis    Past Surgical History  Procedure Laterality Date  . Abdominal hysterectomy    . Cesarean section    . Frontal fusion    . Carpal tunnel release    . Bone graft hip iliac crest    . Neck fusion      patient reported  . Back surgery     No family history on file. History  Substance Use Topics  . Smoking status: Current Every Day Smoker  . Smokeless tobacco: Not on file  . Alcohol Use: No   OB History   Grav Para Term Preterm Abortions TAB SAB Ect Mult Living                 Review of Systems  All other systems reviewed and are negative.    Allergies  Review of patient's allergies indicates no known allergies.  Home Medications   Current Outpatient Rx  Name  Route  Sig  Dispense  Refill  . EXPIRED: albuterol (PROVENTIL HFA;VENTOLIN HFA) 108 (90 BASE) MCG/ACT inhaler   Inhalation   Inhale 2 puffs into the lungs every 4 (four) hours as needed. For wheezing         . HYDROmorphone (DILAUDID) 2 MG tablet   Oral   Take 1 tablet (2 mg total) by mouth every 4 (four) hours as needed for pain.   10 tablet   0   . metFORMIN (GLUCOPHAGE) 500 MG tablet   Oral   Take 500 mg by mouth 2 (two) times daily with a meal.         . metoprolol tartrate (LOPRESSOR) 25 MG tablet   Oral   Take 25 mg by mouth daily. Time taken unknown         . naproxen sodium (ANAPROX) 220 MG tablet   Oral   Take 220 mg by mouth 2 (two) times daily as needed. For tooth pain         . simvastatin (ZOCOR) 20 MG tablet   Oral   Take 20 mg by mouth at bedtime.            BP 122/78  Pulse 104  Temp(Src) 98.3 F (36.8 C) (Oral)  Resp 22  SpO2 95% Physical Exam  Nursing note and vitals reviewed. Constitutional: She is oriented to person, place, and time. She appears well-developed and well-nourished. No distress.  Awake, alert, nontoxic appearance  HENT:  Head:  Atraumatic.  Right Ear: External ear normal.  Left Ear: External ear normal.  Nose: Nose normal.  Mouth/Throat: Oropharynx is clear and moist. No oropharyngeal exudate.  Eyes: Conjunctivae are normal. Right eye exhibits no discharge. Left eye exhibits no discharge.  Neck: Neck supple. No JVD present.  Cardiovascular: Normal rate and regular rhythm.   Pulmonary/Chest: Effort normal. No respiratory distress. She has no wheezes. She has no rales. She exhibits no tenderness.  Abdominal: Soft. There is no tenderness. There is no rebound.  Musculoskeletal: She exhibits no edema and no tenderness.  ROM appears intact, no obvious focal weakness  Neurological: She is alert and oriented to person, place, and time.  Mental status and motor strength appears intact  Skin: No rash noted.  Psychiatric: She has a normal mood and affect.    ED Course   Procedures (including critical care time)  Patient with URI  symptoms, however she is a smoker and since the symptom has been ongoing for a week, we'll treat her with Zithromax, Hycet cough medication, guaifenesin.  Pt to f/u with PCP for further care.  CXR neg for pna.  Low suspicion for PE or cardiac etiology.  Return precaution discussed.    Labs Reviewed - No data to display Dg Chest 2 View  03/26/2013   CLINICAL DATA:  Shortness of breath. Cough.  Chest congestion.  EXAM: CHEST  2 VIEW  COMPARISON:  09/19/2011  FINDINGS: The heart size and mediastinal contours are within normal limits. Both lungs are clear. The visualized skeletal structures are unremarkable.  IMPRESSION: No active cardiopulmonary disease.   Electronically Signed   By: Myles Rosenthal   On: 03/26/2013 20:21   1. Bronchitis   2. URI (upper respiratory infection)     MDM  BP 122/78  Pulse 104  Temp(Src) 98.3 F (36.8 C) (Oral)  Resp 22  SpO2 95%  I have reviewed nursing notes and vital signs. I personally reviewed the imaging tests through PACS system  I reviewed available ER/hospitalization records thought the EMR   Fayrene Helper, [place] 03/26/13 2305

## 2013-03-27 NOTE — ED Provider Notes (Signed)
Medical screening examination/treatment/procedure(s) were performed by non-physician practitioner and as supervising physician I was immediately available for consultation/collaboration.  Shon Baton, MD 03/27/13 [phone]

## 2013-06-22 ENCOUNTER — Other Ambulatory Visit: Payer: Self-pay

## 2013-06-22 DIAGNOSIS — Z1231 Encounter for screening mammogram for malignant neoplasm of breast: Secondary | ICD-10-CM

## 2013-07-27 ENCOUNTER — Ambulatory Visit: Payer: Medicare Other

## 2013-08-09 ENCOUNTER — Ambulatory Visit: Payer: Medicare Other

## 2013-08-21 ENCOUNTER — Emergency Department (INDEPENDENT_AMBULATORY_CARE_PROVIDER_SITE_OTHER)
Admission: EM | Admit: 2013-08-21 | Discharge: 2013-08-21 | Disposition: A | Discharge status: Home / Self Care | Payer: Medicare Other | Source: Home / Self Care | Attending: Family Medicine | Admitting: Family Medicine

## 2013-08-21 ENCOUNTER — Encounter (HOSPITAL_COMMUNITY): Payer: Self-pay | Admitting: Emergency Medicine

## 2013-08-21 DIAGNOSIS — R21 Rash and other nonspecific skin eruption: Secondary | ICD-10-CM

## 2013-08-21 LAB — COMPREHENSIVE METABOLIC PANEL
ALBUMIN: 4.2 g/dL (ref 3.5–5.2)
ALT: 27 U/L (ref 0–35)
AST: 19 U/L (ref 0–37)
Alkaline Phosphatase: 122 U/L — ABNORMAL HIGH (ref 39–117)
BILIRUBIN TOTAL: 0.3 mg/dL (ref 0.3–1.2)
BUN: 8 mg/dL (ref 6–23)
CALCIUM: 9.3 mg/dL (ref 8.4–10.5)
CHLORIDE: 99 meq/L (ref 96–112)
CO2: 25 mEq/L (ref 19–32)
CREATININE: 0.58 mg/dL (ref 0.50–1.10)
GFR calc Af Amer: >90 mL/min (ref >90)
GFR calc non Af Amer: >90 mL/min (ref >90)
Glucose, Bld: 140 mg/dL — ABNORMAL HIGH (ref 70–99)
Potassium: 4 mEq/L (ref 3.7–5.3)
Sodium: 139 mEq/L (ref 137–147)
Total Protein: 7.8 g/dL (ref 6.0–8.3)

## 2013-08-21 LAB — CBC
HCT: 42 % (ref 36.0–46.0)
Hemoglobin: 14.8 g/dL (ref 12.0–15.0)
MCH: 29.6 pg (ref 26.0–34.0)
MCHC: 35.2 g/dL (ref 30.0–36.0)
MCV: 84 fL (ref 78.0–100.0)
PLATELETS: 271 10*3/uL (ref 150–400)
RBC: 5 MIL/uL (ref 3.87–5.11)
RDW: 13.5 % (ref 11.5–15.5)
WBC: 9.4 10*3/uL (ref 4.0–10.5)

## 2013-08-21 LAB — SEDIMENTATION RATE: SED RATE: 10 mm/h (ref 0–22)

## 2013-08-21 MED ORDER — TRIAMCINOLONE ACETONIDE 0.1 % EX CREA: 1.0000 "application " | TOPICAL_CREAM | Freq: Two times a day (BID) | CUTANEOUS | Status: DC

## 2013-08-21 NOTE — ED Notes (Addendum)
Pt c/o insect bites onset 2 months.... Reports daughter is also having a rash.  Rash on both arms and hands... Has had them on back of neck and legs in the past Taking benadryl w/no relief... Feeling nauseas, chest tightness and feels like she has a "copper taste" Denies: f/v/d Alert w/no signs of acute distress.

## 2013-08-21 NOTE — Discharge Instructions (Signed)
Thank you for coming in today. We will get some labs today.  I will call you if they are abnormal.  Use the cream as needed.  Follow up with your doctor.

## 2013-08-21 NOTE — ED Provider Notes (Signed)
KERRY-ANNE MEZO is a 48 y.o. female who presents to Urgent Care today for pruritic papules on extremities. This is been present for about 2 months now. She suspects that there are bugs biting her at nighttime. She denies seeing any bed bugs. She does note that her daughter has a similar rash. He bites become very large and itchy. She denies any fevers chills nausea vomiting or diarrhea. They are not particularly tender. She feels well otherwise.   Past Medical History  Diagnosis Date  . Diabetes mellitus   . Hypertension   . Chronic back pain   . Pancreatitis    History  Substance Use Topics  . Smoking status: Current Every Day Smoker  . Smokeless tobacco: Not on file  . Alcohol Use: No   ROS as above Medications reviewed. No current facility-administered medications for this encounter.   Current Outpatient Prescriptions  Medication Sig Dispense Refill  . ALPRAZolam (XANAX) 0.25 MG tablet Take 0.25 mg by mouth 3 (three) times daily as needed for sleep.      . metFORMIN (GLUCOPHAGE) 500 MG tablet Take 500 mg by mouth 2 (two) times daily with a meal.      . metoprolol tartrate (LOPRESSOR) 25 MG tablet Take 25 mg by mouth daily. Time taken unknown      . simvastatin (ZOCOR) 20 MG tablet Take 20 mg by mouth at bedtime.        Marland Kitchen albuterol (PROVENTIL HFA;VENTOLIN HFA) 108 (90 BASE) MCG/ACT inhaler Inhale 2 puffs into the lungs every 4 (four) hours as needed for wheezing.      Marland Kitchen azithromycin (ZITHROMAX) 250 MG tablet Take 1 tablet (250 mg total) by mouth daily.  4 tablet  0  . Esomeprazole Magnesium (NEXIUM PO) Take 1 capsule by mouth daily.      Marland Kitchen guaiFENesin (ROBITUSSIN) 100 MG/5ML liquid Take 5-10 mL (100-200 mg total) by mouth every 4 (four) hours as needed for cough.  60 mL  0  . HYDROcodone-acetaminophen (HYCET) 7.5-325 mg/15 ml solution Take 15 mL by mouth 4 (four) times daily as needed for pain.  120 mL  0  . Multiple Vitamins-Minerals (MULTIVITAMIN WITH MINERALS) tablet Take 1 tablet  by mouth daily.      . naproxen sodium (ANAPROX) 220 MG tablet Take 220 mg by mouth 2 (two) times daily as needed. For tooth pain      . triamcinolone cream (KENALOG) 0.1 % Apply 1 application topically 2 (two) times daily.  30 g  2    Exam:  BP 143/88  Pulse 82  Temp(Src) 97.8 F (36.6 C) (Oral)  Resp 18  SpO2 98% Gen: Well NAD HEENT: MMM Lungs: Normal work of breathing. CTABL Heart: RRR no MRG Abd: NABS, Soft. NT, ND Exts: Non edematous BL  LE, warm and well perfused.  Skin: One large erythematous nonindurated nontender patch right volar forearm measuring 3 x 3 cm. Coordination is present.  Results for orders placed during the hospital encounter of 08/21/13 (from the past 24 hour(s))  CBC     Status: None   Collection Time    08/21/13  4:25 PM      Result Value Range   WBC 9.4  4.0 - 10.5 K/uL   RBC 5.00  3.87 - 5.11 MIL/uL   Hemoglobin 14.8  12.0 - 15.0 g/dL   HCT 42.0  36.0 - 46.0 %   MCV 84.0  78.0 - 100.0 fL   MCH 29.6  26.0 - 34.0 pg  MCHC 35.2  30.0 - 36.0 g/dL   RDW 13.5  11.5 - 15.5 %   Platelets 271  150 - 400 K/uL   No results found.  Assessment and Plan: 48 y.o. female with probable bug bites. This is likely bed bugs. Symptoms not consistent with scabies. Plan to evaluate for other causes of rash including a limited rheumatologic workup with CBC, sed rate, ANA. Recommend followup with primary care provider. We'll treat rash with triamcinolone cream.  Discussed warning signs or symptoms. Please see discharge instructions. Patient expresses understanding.    Gregor Hams, MD 08/21/13 [phone]

## 2013-08-23 LAB — ANA: ANA: NEGATIVE

## 2013-10-18 ENCOUNTER — Ambulatory Visit: Payer: Medicare Other

## 2013-12-22 ENCOUNTER — Ambulatory Visit
Admission: RE | Admit: 2013-12-22 | Discharge: 2013-12-22 | Disposition: A | Discharge status: Home / Self Care | Payer: Medicare Other | Source: Ambulatory Visit

## 2013-12-22 ENCOUNTER — Encounter (INDEPENDENT_AMBULATORY_CARE_PROVIDER_SITE_OTHER): Payer: Self-pay

## 2013-12-22 DIAGNOSIS — Z1231 Encounter for screening mammogram for malignant neoplasm of breast: Secondary | ICD-10-CM

## 2014-03-11 ENCOUNTER — Emergency Department (HOSPITAL_COMMUNITY): Payer: Medicare Other

## 2014-03-11 ENCOUNTER — Emergency Department (HOSPITAL_COMMUNITY)
Admission: EM | Admit: 2014-03-11 | Discharge: 2014-03-12 | Disposition: A | Discharge status: Home / Self Care | Payer: Medicare Other | Attending: Emergency Medicine | Admitting: Emergency Medicine

## 2014-03-11 ENCOUNTER — Encounter (HOSPITAL_COMMUNITY): Payer: Self-pay | Admitting: Emergency Medicine

## 2014-03-11 DIAGNOSIS — E119 Type 2 diabetes mellitus without complications: Secondary | ICD-10-CM | POA: Diagnosis not present

## 2014-03-11 DIAGNOSIS — R079 Chest pain, unspecified: Secondary | ICD-10-CM | POA: Insufficient documentation

## 2014-03-11 DIAGNOSIS — F17200 Nicotine dependence, unspecified, uncomplicated: Secondary | ICD-10-CM | POA: Diagnosis not present

## 2014-03-11 DIAGNOSIS — I10 Essential (primary) hypertension: Secondary | ICD-10-CM | POA: Diagnosis not present

## 2014-03-11 DIAGNOSIS — Z8739 Personal history of other diseases of the musculoskeletal system and connective tissue: Secondary | ICD-10-CM | POA: Diagnosis not present

## 2014-03-11 DIAGNOSIS — G8929 Other chronic pain: Secondary | ICD-10-CM | POA: Diagnosis not present

## 2014-03-11 DIAGNOSIS — Z79899 Other long term (current) drug therapy: Secondary | ICD-10-CM | POA: Insufficient documentation

## 2014-03-11 DIAGNOSIS — Z8719 Personal history of other diseases of the digestive system: Secondary | ICD-10-CM | POA: Insufficient documentation

## 2014-03-11 HISTORY — DX: Fibromyalgia: M79.7

## 2014-03-11 LAB — CBC
HCT: 41.2 % (ref 36.0–46.0)
HEMOGLOBIN: 14 g/dL (ref 12.0–15.0)
MCH: 28.2 pg (ref 26.0–34.0)
MCHC: 34 g/dL (ref 30.0–36.0)
MCV: 83.1 fL (ref 78.0–100.0)
Platelets: 295 10*3/uL (ref 150–400)
RBC: 4.96 MIL/uL (ref 3.87–5.11)
RDW: 13.6 % (ref 11.5–15.5)
WBC: 10.9 10*3/uL — ABNORMAL HIGH (ref 4.0–10.5)

## 2014-03-11 LAB — BASIC METABOLIC PANEL
Anion gap: 16 — ABNORMAL HIGH (ref 5–15)
BUN: 12 mg/dL (ref 6–23)
CALCIUM: 9.4 mg/dL (ref 8.4–10.5)
CO2: 22 meq/L (ref 19–32)
CREATININE: 0.53 mg/dL (ref 0.50–1.10)
Chloride: 101 mEq/L (ref 96–112)
GFR calc Af Amer: >90 mL/min (ref >90)
GFR calc non Af Amer: >90 mL/min (ref >90)
GLUCOSE: 200 mg/dL — AB (ref 70–99)
Potassium: 4 mEq/L (ref 3.7–5.3)
Sodium: 139 mEq/L (ref 137–147)

## 2014-03-11 LAB — I-STAT TROPONIN, ED: Troponin i, poc: 0 ng/mL (ref 0.00–0.08)

## 2014-03-11 LAB — PRO B NATRIURETIC PEPTIDE: Pro B Natriuretic peptide (BNP): 44.2 pg/mL (ref 0–125)

## 2014-03-11 MED ORDER — PREDNISONE 20 MG PO TABS
60.0000 mg | ORAL_TABLET | ORAL | Status: AC
  Administered 2014-03-11: 60 mg via ORAL
  Filled 2014-03-11: qty 3

## 2014-03-11 MED ORDER — SODIUM CHLORIDE 0.9 % IV BOLUS (SEPSIS)
500.0000 mL | INTRAVENOUS | Status: AC
  Administered 2014-03-11: 500 mL via INTRAVENOUS

## 2014-03-11 MED ORDER — KETOROLAC TROMETHAMINE 30 MG/ML IJ SOLN
30.0000 mg | Freq: Once | INTRAMUSCULAR | Status: AC
  Administered 2014-03-11: 30 mg via INTRAVENOUS
  Filled 2014-03-11: qty 1

## 2014-03-11 NOTE — ED Notes (Signed)
Pt. reports intermittent mid chest pain radiating to left arm onset 2 days ago with SOB , nausea and mild diaphoresis .

## 2014-03-11 NOTE — ED Provider Notes (Signed)
CSN: [id number]     Arrival date & time 03/11/14  2141 History   First MD Initiated Contact with Patient 03/11/14 2304     Chief Complaint  Patient presents with  . Chest Pain     (Consider location/radiation/quality/duration/timing/severity/associated sxs/prior Treatment) HPI Patient presents with weeks to months of ongoing chest pain, left arm pain and dysesthesia. 2 onset is unclear, but over the past weeks symptoms have become more present.  Essentially there is a soreness about the left upper chest, left shoulder, with dysesthesia radiating down the left upper extremity. There is no complete loss of sensation, normal function of the left upper extremity. No pleuritic or exertional changes. Some relief with OTC medication. She has been evaluated by her primary care physician for these complaints. Patient smokes.  I discussed, at length, the need for smoking cessation with the patient.   Past Medical History  Diagnosis Date  . Diabetes mellitus   . Hypertension   . Chronic back pain   . Pancreatitis   . Fibromyalgia    Past Surgical History  Procedure Laterality Date  . Abdominal hysterectomy    . Cesarean section    . Frontal fusion    . Carpal tunnel release    . Bone graft hip iliac crest    . Neck fusion      patient reported  . Back surgery     No family history on file. History  Substance Use Topics  . Smoking status: Current Every Day Smoker  . Smokeless tobacco: Not on file  . Alcohol Use: No   OB History   Grav Para Term Preterm Abortions TAB SAB Ect Mult Living                 Review of Systems  Constitutional:       Per HPI, otherwise negative  HENT:       Per HPI, otherwise negative  Respiratory:       Per HPI, otherwise negative  Cardiovascular:       Per HPI, otherwise negative  Gastrointestinal: Negative for vomiting.  Endocrine:       Negative aside from HPI  Genitourinary:       Neg aside from HPI   Musculoskeletal:       Per  HPI, otherwise negative  Skin: Negative.   Neurological: Negative for syncope.      Allergies  Review of patient's allergies indicates no known allergies.  Home Medications   Prior to Admission medications   Medication Sig Start Date End Date Taking? Authorizing Provider  albuterol (PROVENTIL HFA;VENTOLIN HFA) 108 (90 BASE) MCG/ACT inhaler Inhale 2 puffs into the lungs every 4 (four) hours as needed for wheezing.   Yes Historical Provider, MD  AZOR 5-40 MG per tablet Take 1 tablet by mouth daily.  02/06/14  Yes Historical Provider, MD  metFORMIN (GLUCOPHAGE) 500 MG tablet Take 500 mg by mouth 2 (two) times daily with a meal.   Yes Historical Provider, MD  metoprolol succinate (TOPROL-XL) 25 MG 24 hr tablet Take 25 mg by mouth daily.  03/09/14  Yes Historical Provider, MD  simvastatin (ZOCOR) 20 MG tablet Take 20 mg by mouth at bedtime.     Yes Historical Provider, MD   BP 125/82  Pulse 93  Temp(Src) 98.5 F (36.9 C) (Oral)  Resp 18  SpO2 98% Physical Exam  Nursing note and vitals reviewed. Constitutional: She is oriented to person, place, and time. She appears well-developed and well-nourished. No  distress.  HENT:  Head: Normocephalic and atraumatic.  Eyes: Conjunctivae and EOM are normal.  Neck: Neck supple. No spinous process tenderness and no muscular tenderness present. No rigidity. No edema, no erythema and normal range of motion present.  Negative Spurling test  Cardiovascular: Normal rate and regular rhythm.   Pulmonary/Chest: Effort normal and breath sounds normal. No stridor. No respiratory distress.    Abdominal: She exhibits no distension.  Musculoskeletal: She exhibits no edema.  Neurological: She is alert and oriented to person, place, and time. No cranial nerve deficit.  Skin: Skin is warm and dry.  Psychiatric: She has a normal mood and affect.    ED Course  Procedures (including critical care time) Labs Review Labs Reviewed  CBC - Abnormal; Notable for  the following:    WBC 10.9 (*)    All other components within normal limits  BASIC METABOLIC PANEL - Abnormal; Notable for the following:    Glucose, Bld 200 (*)    Anion gap 16 (*)    All other components within normal limits  PRO B NATRIURETIC PEPTIDE  I-STAT TROPOININ, ED    Imaging Review Dg Chest 2 View  03/11/2014   CLINICAL DATA:  Chest pain and shortness of breath.  EXAM: CHEST  2 VIEW  COMPARISON:  Chest radiograph March 26, 2013  FINDINGS: Cardiomediastinal silhouette is unremarkable. The lungs are clear without pleural effusions or focal consolidations. Trachea projects midline and there is no pneumothorax. Soft tissue planes and included osseous structures are non-suspicious. Mild degenerative change of the thoracic spine. Punctate calcification at left humeral head may reflect calcific tendinopathy. Surgical clips in the included right abdomen likely reflect cholecystectomy.  IMPRESSION: No active cardiopulmonary disease.   Electronically Signed   By: Elon Alas   On: 03/11/2014 22:41     EKG Interpretation   Date/Time:  Friday March 11 2014 21:45:02 EDT Ventricular Rate:  105 PR Interval:  182 QRS Duration: 78 QT Interval:  350 QTC Calculation: 462 R Axis:   34 Text Interpretation:  Sinus tachycardia Low voltage QRS Cannot rule out  Anterior infarct , age undetermined Abnormal ECG Since last tracing rate  faster Confirmed by [facility]  MD, ELLIOTT ([id number]) on 03/11/2014 10:50:09 PM     1:03 AM Patient still in pain.  Labs WNL  3:47 AM Patient asymptomatic, second troponin normal. We discussed all findings, return precautions, follow up instructions.  MDM   Well-appearing female presents with ongoing chest pain.  Patient is awake, alert, in no distress. Patient is hemodynamically stable, his reassuring labs, including serial negative troponins, and with resolution of pain, there is low suspicion for acute occult pathology.  His primary care with eventual  followup.  She was started on prednisone for presumed musculoskeletal inflammation.  She was also advised of the necessity to stop smoking.    Carmin Muskrat, MD 03/12/14 [phone]

## 2014-03-12 DIAGNOSIS — R079 Chest pain, unspecified: Secondary | ICD-10-CM | POA: Diagnosis not present

## 2014-03-12 LAB — TROPONIN I: Troponin I: <0.3 ng/mL (ref <0.30)

## 2014-03-12 MED ORDER — HYDROMORPHONE HCL PF 1 MG/ML IJ SOLN
1.0000 mg | Freq: Once | INTRAMUSCULAR | Status: AC
  Administered 2014-03-12: 1 mg via INTRAVENOUS
  Filled 2014-03-12: qty 1

## 2014-03-12 MED ORDER — PREDNISONE 20 MG PO TABS: 60.0000 mg | ORAL_TABLET | ORAL | Status: AC

## 2014-03-12 NOTE — Discharge Instructions (Signed)
As discussed, your evaluation today has been largely reassuring.  But, it is important that you monitor your condition carefully, and do not hesitate to return to the ED if you develop new, or concerning changes in your condition.  The single most important thing you can do to decrease your symptoms is to stop smoking.  Otherwise, please follow-up with your physician for appropriate ongoing care.

## 2014-08-02 ENCOUNTER — Emergency Department (HOSPITAL_COMMUNITY)
Admission: EM | Admit: 2014-08-02 | Discharge: 2014-08-03 | Disposition: A | Discharge status: Home / Self Care | Payer: Medicare Other | Attending: Emergency Medicine | Admitting: Emergency Medicine

## 2014-08-02 ENCOUNTER — Encounter (HOSPITAL_COMMUNITY): Payer: Self-pay | Admitting: Cardiology

## 2014-08-02 DIAGNOSIS — G8929 Other chronic pain: Secondary | ICD-10-CM | POA: Insufficient documentation

## 2014-08-02 DIAGNOSIS — R1013 Epigastric pain: Secondary | ICD-10-CM | POA: Diagnosis present

## 2014-08-02 DIAGNOSIS — I10 Essential (primary) hypertension: Secondary | ICD-10-CM | POA: Diagnosis not present

## 2014-08-02 DIAGNOSIS — K858 Other acute pancreatitis without necrosis or infection: Secondary | ICD-10-CM

## 2014-08-02 DIAGNOSIS — Z720 Tobacco use: Secondary | ICD-10-CM | POA: Diagnosis not present

## 2014-08-02 DIAGNOSIS — M797 Fibromyalgia: Secondary | ICD-10-CM | POA: Insufficient documentation

## 2014-08-02 DIAGNOSIS — Z79899 Other long term (current) drug therapy: Secondary | ICD-10-CM | POA: Insufficient documentation

## 2014-08-02 DIAGNOSIS — E119 Type 2 diabetes mellitus without complications: Secondary | ICD-10-CM | POA: Insufficient documentation

## 2014-08-02 DIAGNOSIS — Z90710 Acquired absence of both cervix and uterus: Secondary | ICD-10-CM | POA: Diagnosis not present

## 2014-08-02 LAB — CBC WITH DIFFERENTIAL/PLATELET
BASOS PCT: 0 % (ref 0–1)
Basophils Absolute: 0 10*3/uL (ref 0.0–0.1)
EOS ABS: 0.1 10*3/uL (ref 0.0–0.7)
EOS PCT: 1 % (ref 0–5)
HCT: 39.8 % (ref 36.0–46.0)
Hemoglobin: 13.8 g/dL (ref 12.0–15.0)
LYMPHS ABS: 4 10*3/uL (ref 0.7–4.0)
Lymphocytes Relative: 41 % (ref 12–46)
MCH: 28.7 pg (ref 26.0–34.0)
MCHC: 34.7 g/dL (ref 30.0–36.0)
MCV: 82.7 fL (ref 78.0–100.0)
Monocytes Absolute: 0.5 10*3/uL (ref 0.1–1.0)
Monocytes Relative: 5 % (ref 3–12)
Neutro Abs: 5.1 10*3/uL (ref 1.7–7.7)
Neutrophils Relative %: 53 % (ref 43–77)
PLATELETS: 300 10*3/uL (ref 150–400)
RBC: 4.81 MIL/uL (ref 3.87–5.11)
RDW: 13.3 % (ref 11.5–15.5)
WBC: 9.6 10*3/uL (ref 4.0–10.5)

## 2014-08-02 LAB — URINALYSIS, ROUTINE W REFLEX MICROSCOPIC
Bilirubin Urine: NEGATIVE
Glucose, UA: 500 mg/dL — AB
Ketones, ur: NEGATIVE mg/dL
Leukocytes, UA: NEGATIVE
Nitrite: NEGATIVE
PROTEIN: NEGATIVE mg/dL
SPECIFIC GRAVITY, URINE: 1.027 (ref 1.005–1.030)
UROBILINOGEN UA: 0.2 mg/dL (ref 0.0–1.0)
pH: 5.5 (ref 5.0–8.0)

## 2014-08-02 LAB — BASIC METABOLIC PANEL
ANION GAP: 9 (ref 5–15)
BUN: 10 mg/dL (ref 6–23)
CO2: 26 mmol/L (ref 19–32)
Calcium: 9 mg/dL (ref 8.4–10.5)
Chloride: 103 mEq/L (ref 96–112)
Creatinine, Ser: 0.7 mg/dL (ref 0.50–1.10)
GFR calc Af Amer: >90 mL/min (ref >90)
GFR calc non Af Amer: >90 mL/min (ref >90)
GLUCOSE: 191 mg/dL — AB (ref 70–99)
Potassium: 3.5 mmol/L (ref 3.5–5.1)
SODIUM: 138 mmol/L (ref 135–145)

## 2014-08-02 LAB — URINE MICROSCOPIC-ADD ON

## 2014-08-02 LAB — LIPASE, BLOOD: Lipase: 100 U/L — ABNORMAL HIGH (ref 11–59)

## 2014-08-02 MED ORDER — HYDROMORPHONE HCL 1 MG/ML IJ SOLN
1.0000 mg | Freq: Once | INTRAMUSCULAR | Status: AC
  Administered 2014-08-02: 1 mg via INTRAVENOUS
  Filled 2014-08-02: qty 1

## 2014-08-02 MED ORDER — ONDANSETRON HCL 4 MG/2ML IJ SOLN
4.0000 mg | Freq: Once | INTRAMUSCULAR | Status: AC
  Administered 2014-08-02: 4 mg via INTRAVENOUS
  Filled 2014-08-02: qty 2

## 2014-08-02 MED ORDER — IOHEXOL 300 MG/ML  SOLN
25.0000 mL | Freq: Once | INTRAMUSCULAR | Status: AC | PRN
  Administered 2014-08-02: 25 mL via ORAL

## 2014-08-02 NOTE — ED Provider Notes (Signed)
CSN: [id number]     Arrival date & time 08/02/14  1843 History   First MD Initiated Contact with Patient 08/02/14 2041     Chief Complaint  Patient presents with  . Abdominal Pain     (Consider location/radiation/quality/duration/timing/severity/associated sxs/prior Treatment) Patient is a 48 y.o. female presenting with abdominal pain. The history is provided by the patient.  Abdominal Pain Pain location:  Epigastric Pain quality: sharp   Pain radiates to:  Back Pain severity:  Severe Onset quality:  Gradual Duration:  1 week Timing:  Constant Progression:  Worsening Chronicity:  Recurrent Relieved by:  Nothing Worsened by:  Eating Ineffective treatments:  None tried Associated symptoms: nausea   Associated symptoms: no chest pain, no chills, no constipation, no cough, no dysuria, no fatigue, no fever, no shortness of breath, no sore throat, no vaginal bleeding, no vaginal discharge and no vomiting     Past Medical History  Diagnosis Date  . Diabetes mellitus   . Hypertension   . Chronic back pain   . Pancreatitis   . Fibromyalgia    Past Surgical History  Procedure Laterality Date  . Abdominal hysterectomy    . Cesarean section    . Frontal fusion    . Carpal tunnel release    . Bone graft hip iliac crest    . Neck fusion      patient reported  . Back surgery     History reviewed. No pertinent family history. History  Substance Use Topics  . Smoking status: Current Every Day Smoker    Types: Cigarettes  . Smokeless tobacco: Not on file  . Alcohol Use: No   OB History    No data available     Review of Systems  Constitutional: Negative for fever, chills, diaphoresis, activity change, appetite change and fatigue.  HENT: Negative for facial swelling, rhinorrhea, sore throat, trouble swallowing and voice change.   Eyes: Negative for photophobia, pain and visual disturbance.  Respiratory: Negative for cough, shortness of breath, wheezing and stridor.    Cardiovascular: Negative for chest pain, palpitations and leg swelling.  Gastrointestinal: Positive for nausea and abdominal pain. Negative for vomiting, constipation and anal bleeding.  Endocrine: Negative.   Genitourinary: Negative for dysuria, vaginal bleeding, vaginal discharge and vaginal pain.  Musculoskeletal: Negative for myalgias, back pain and arthralgias.  Skin: Negative.  Negative for rash.  Allergic/Immunologic: Negative.   Neurological: Negative for dizziness, tremors, syncope, weakness and headaches.  Psychiatric/Behavioral: Negative for suicidal ideas, sleep disturbance and self-injury.  All other systems reviewed and are negative.     Allergies  Review of patient's allergies indicates no known allergies.  Home Medications   Prior to Admission medications   Medication Sig Start Date End Date Taking? Authorizing Provider  albuterol (PROVENTIL HFA;VENTOLIN HFA) 108 (90 BASE) MCG/ACT inhaler Inhale 2 puffs into the lungs every 4 (four) hours as needed for wheezing.   Yes Historical Provider, MD  AZOR 5-40 MG per tablet Take 1 tablet by mouth daily.  02/06/14  Yes Historical Provider, MD  cyclobenzaprine (FLEXERIL) 10 MG tablet Take 10 mg by mouth at bedtime. 07/19/14  Yes Historical Provider, MD  INVOKANA 100 MG TABS tablet Take 100 mg by mouth daily. 07/22/14  Yes Historical Provider, MD  metFORMIN (GLUCOPHAGE) 500 MG tablet Take 500 mg by mouth 2 (two) times daily with a meal.   Yes Historical Provider, MD  metoprolol succinate (TOPROL-XL) 50 MG 24 hr tablet Take 50 mg by mouth daily. 07/21/14  Yes Historical Provider, MD  phentermine (ADIPEX-P) 37.5 MG tablet Take 37.5 mg by mouth daily. 07/17/14  Yes Historical Provider, MD  simvastatin (ZOCOR) 20 MG tablet Take 20 mg by mouth at bedtime.     Yes Historical Provider, MD  ondansetron (ZOFRAN) 4 MG tablet Take 1 tablet (4 mg total) by mouth every 6 (six) hours. 08/03/14   [name], MD  oxyCODONE-acetaminophen  (PERCOCET/ROXICET) 5-325 MG per tablet Take 1 tablet by mouth every 4 (four) hours as needed for moderate pain or severe pain. 08/03/14   [name], MD   BP 133/82 mmHg  Pulse 76  Temp(Src) 98.2 F (36.8 C) (Oral)  Resp 18  Ht 5\' 3"  (1.6 m)  Wt 230 lb (104.327 kg)  BMI 40.75 kg/m2  SpO2 95% Physical Exam  Constitutional: She is oriented to person, place, and time. She appears well-developed and well-nourished. No distress.  HENT:  Head: Normocephalic and atraumatic.  Right Ear: External ear normal.  Left Ear: External ear normal.  Mouth/Throat: Oropharynx is clear and moist. No oropharyngeal exudate.  Eyes: Conjunctivae and EOM are normal. Pupils are equal, round, and reactive to light. No scleral icterus.  Neck: Normal range of motion. Neck supple. No JVD present. No tracheal deviation present. No thyromegaly present.  Cardiovascular: Normal rate, regular rhythm and intact distal pulses.  Exam reveals no gallop and no friction rub.   No murmur heard. Pulmonary/Chest: Effort normal and breath sounds normal. No respiratory distress. She has no wheezes. She has no rales.  Abdominal: Soft. Bowel sounds are normal. She exhibits no distension. There is tenderness (signifigant epigastric tenderness. Remainder of abdomen is non-tender and unremarkable).  Musculoskeletal: Normal range of motion. She exhibits no edema or tenderness.  Neurological: She is alert and oriented to person, place, and time. No cranial nerve deficit. She exhibits normal muscle tone. Coordination normal.  5/5 strength in all 4 extremities. Normal Gait.   Skin: Skin is warm and dry. She is not diaphoretic. No pallor.  Psychiatric: She has a normal mood and affect. She expresses no homicidal and no suicidal ideation. She expresses no suicidal plans and no homicidal plans.  Nursing note and vitals reviewed.   ED Course  Procedures (including critical care time) Labs Review Labs Reviewed  BASIC METABOLIC PANEL -  Abnormal; Notable for the following:    Glucose, Bld 191 (*)    All other components within normal limits  LIPASE, BLOOD - Abnormal; Notable for the following:    Lipase 100 (*)    All other components within normal limits  URINALYSIS, ROUTINE W REFLEX MICROSCOPIC - Abnormal; Notable for the following:    APPearance CLOUDY (*)    Glucose, UA 500 (*)    Hgb urine dipstick SMALL (*)    All other components within normal limits  URINE MICROSCOPIC-ADD ON - Abnormal; Notable for the following:    Squamous Epithelial / LPF FEW (*)    Bacteria, UA FEW (*)    Casts HYALINE CASTS (*)    All other components within normal limits  CBC WITH DIFFERENTIAL    Imaging Review Ct Abdomen Pelvis W Contrast  08/03/2014   CLINICAL DATA:  Acute onset of generalized abdominal pain. Initial encounter.  EXAM: CT ABDOMEN AND PELVIS WITH CONTRAST  TECHNIQUE: Multidetector CT imaging of the abdomen and pelvis was performed using the standard protocol following bolus administration of intravenous contrast.  CONTRAST:  140mL OMNIPAQUE IOHEXOL 300 MG/ML  SOLN  COMPARISON:  CT of the abdomen  and pelvis performed 12/24/2011  FINDINGS: Minimal bibasilar atelectasis is noted.  The liver and spleen are unremarkable in appearance. The gallbladder is within normal limits. The right adrenal gland is unremarkable. A small 1.1 cm hypoattenuating lesion at the left adrenal gland is perhaps slightly decreased in size, with minimal calcification.  There may be slight haziness about the head of the pancreas on coronal images, which could reflect minimal pancreatitis given the patient's elevated lipase. This is not well seen on axial images.  The kidneys are unremarkable in appearance. There is no evidence of hydronephrosis. No renal or ureteral stones are seen. No perinephric stranding is appreciated.  No free fluid is identified. The small bowel is unremarkable in appearance. The stomach is within normal limits. No acute vascular  abnormalities are seen.  The appendix is normal in caliber, extending to the inferior tip of the liver, without evidence for appendicitis. The colon is unremarkable in appearance.  There is mild asymmetric atrophy of the left rectus abdominis musculature.  The bladder is mildly distended and grossly unremarkable. The patient is status post hysterectomy. No suspicious adnexal masses are seen. The left ovary is slightly larger than the right, but the ovaries are unremarkable in appearance. No inguinal lymphadenopathy is seen.  No acute osseous abnormalities are identified. The patient is status post interbody fusion at L4-L5 and L5-S1, with associated postoperative change.  IMPRESSION: 1. Possible slight haziness about the head of the pancreas on coronal images, which could reflect minimal pancreatitis given the patient's elevated lipase. No evidence of pseudocyst formation or devascularization. 2. Mild asymmetric atrophy of the left rectus abdominis musculature, similar in appearance to the prior study. 3. 1.1 cm left adrenal lesion has decreased slightly in size since 2013, with minimal calcification; it is likely benign.   Electronically Signed   By: Garald Balding M.D.   On: 08/03/2014 01:32     EKG Interpretation None      MDM   Final diagnoses:  Epigastric abdominal pain  Other acute pancreatitis    The patient is a 48 y.o. Female with hx of pancreatitis who presents for 1 week of sharp epigastric pain. Patient is AFVSS. Exam as above. Labs show elevated lipase to 100 but are otherwise unremarkable. Patient is given pain medication but still has significant abdominal tenderness. CT abdomin pelvis shows evidence of pancreatitis but no other acute pathology. Patient able to tolerate PO well in the ED. Feel patient appropriate for discharge home with pain and nausea control, PCP follow up, and standard ED return precautions. Patient expresses understanding and agreement with this plan and is  discharged home.  Patient seen with attending, Dr. Regenia Skeeter, who oversaw clinical decision making.     [name], MD 08/03/14 3888  Ephraim Hamburger, MD 08/11/14 [phone]

## 2014-08-02 NOTE — ED Notes (Signed)
Pt reports that she has been having abd pain and problems with her pancreas. Reports that the PCP has changed her medication for her diabetes recently and is concerned.

## 2014-08-03 ENCOUNTER — Emergency Department (HOSPITAL_COMMUNITY): Payer: Medicare Other

## 2014-08-03 DIAGNOSIS — K858 Other acute pancreatitis: Secondary | ICD-10-CM | POA: Diagnosis not present

## 2014-08-03 MED ORDER — ONDANSETRON HCL 4 MG PO TABS: 4.0000 mg | ORAL_TABLET | Freq: Four times a day (QID) | ORAL | Status: DC

## 2014-08-03 MED ORDER — OXYCODONE-ACETAMINOPHEN 5-325 MG PO TABS: 1.0000 | ORAL_TABLET | ORAL | Status: DC | PRN

## 2014-08-03 MED ORDER — ONDANSETRON HCL 4 MG/2ML IJ SOLN
4.0000 mg | Freq: Once | INTRAMUSCULAR | Status: AC
  Administered 2014-08-03: 4 mg via INTRAVENOUS
  Filled 2014-08-03: qty 2

## 2014-08-03 MED ORDER — IOHEXOL 300 MG/ML  SOLN
100.0000 mL | Freq: Once | INTRAMUSCULAR | Status: AC | PRN
  Administered 2014-08-03: 100 mL via INTRAVENOUS

## 2014-08-03 MED ORDER — HYDROMORPHONE HCL 1 MG/ML IJ SOLN
1.0000 mg | Freq: Once | INTRAMUSCULAR | Status: AC
  Administered 2014-08-03: 1 mg via INTRAVENOUS
  Filled 2014-08-03: qty 1

## 2014-08-03 NOTE — Discharge Instructions (Signed)

## 2014-08-03 NOTE — ED Notes (Signed)
Pt requesting nausea medication. Dr. Rhina Brackett informed.

## 2014-08-03 NOTE — ED Notes (Signed)
This RN called CT to determine delay in results for CT scan and was told they are working as fast as they can to have the results. Pt informed.

## 2014-08-22 ENCOUNTER — Emergency Department (INDEPENDENT_AMBULATORY_CARE_PROVIDER_SITE_OTHER)
Admission: EM | Admit: 2014-08-22 | Discharge: 2014-08-22 | Disposition: A | Discharge status: Home / Self Care | Payer: Medicare Other | Source: Home / Self Care | Attending: Emergency Medicine | Admitting: Emergency Medicine

## 2014-08-22 ENCOUNTER — Ambulatory Visit (HOSPITAL_COMMUNITY): Payer: Medicare Other | Attending: Emergency Medicine

## 2014-08-22 ENCOUNTER — Encounter (HOSPITAL_COMMUNITY): Payer: Self-pay

## 2014-08-22 DIAGNOSIS — M25552 Pain in left hip: Secondary | ICD-10-CM | POA: Insufficient documentation

## 2014-08-22 DIAGNOSIS — M25519 Pain in unspecified shoulder: Secondary | ICD-10-CM

## 2014-08-22 DIAGNOSIS — M19011 Primary osteoarthritis, right shoulder: Secondary | ICD-10-CM

## 2014-08-22 DIAGNOSIS — M25559 Pain in unspecified hip: Secondary | ICD-10-CM

## 2014-08-22 DIAGNOSIS — M1612 Unilateral primary osteoarthritis, left hip: Secondary | ICD-10-CM | POA: Diagnosis not present

## 2014-08-22 DIAGNOSIS — M19019 Primary osteoarthritis, unspecified shoulder: Secondary | ICD-10-CM

## 2014-08-22 DIAGNOSIS — M1990 Unspecified osteoarthritis, unspecified site: Secondary | ICD-10-CM

## 2014-08-22 DIAGNOSIS — M25511 Pain in right shoulder: Secondary | ICD-10-CM

## 2014-08-22 MED ORDER — LIDOCAINE HCL (PF) 1 % IJ SOLN
INTRAMUSCULAR | Status: AC
  Filled 2014-08-22: qty 30

## 2014-08-22 MED ORDER — METHYLPREDNISOLONE ACETATE 40 MG/ML IJ SUSP
INTRAMUSCULAR | Status: AC
  Filled 2014-08-22: qty 1

## 2014-08-22 MED ORDER — KETOROLAC TROMETHAMINE 60 MG/2ML IM SOLN
60.0000 mg | Freq: Once | INTRAMUSCULAR | Status: AC
  Administered 2014-08-22: 60 mg via INTRAMUSCULAR

## 2014-08-22 MED ORDER — KETOROLAC TROMETHAMINE 60 MG/2ML IM SOLN
INTRAMUSCULAR | Status: AC
  Filled 2014-08-22: qty 2

## 2014-08-22 MED ORDER — TRAMADOL HCL 50 MG PO TABS
100.0000 mg | ORAL_TABLET | Freq: Three times a day (TID) | ORAL | Status: DC | PRN
Start: 2014-08-22 — End: 2014-12-11

## 2014-08-22 MED ORDER — MELOXICAM 15 MG PO TABS: 15.0000 mg | ORAL_TABLET | Freq: Every day | ORAL | Status: DC

## 2014-08-22 MED ORDER — METHYLPREDNISOLONE ACETATE 40 MG/ML IJ SUSP
20.0000 mg | Freq: Once | INTRAMUSCULAR | Status: AC
  Administered 2014-08-22: 20 mg via INTRA_ARTICULAR

## 2014-08-22 NOTE — Discharge Instructions (Signed)
° °  Call or go to the ER if you develop a large red swollen joint with extreme pain or oozing puss.

## 2014-08-22 NOTE — ED Notes (Addendum)
Reports joint pain x 2 weeks. Right shoulder and left hip/ no known trauma. Dr Louanne Skye for ortho issues

## 2014-08-22 NOTE — ED Notes (Signed)
To x-ray

## 2014-08-22 NOTE — ED Provider Notes (Signed)
Right shoulder Limited musculoskeletal ultrasound and ultrasound-guided injection  Ultrasound probe placed over the Ac joint. The joint capsule is distended and the joint space is narrowed.  Consent obtained and timeout performed. Discussed risks and benefits of injection including infection and skin hypopigmentation. Patient agreed. The probe was again placed over the [place]. Elta [place] was made at the planned injection site. The skin was sterilized with alcohol as was the probe. Sterile gel was used. A 27-gauge 2 inch and was used to access the joint space. However prior to injection patient moves significantly. We attempted to inject a small amount of fluid was injected and patient demanded the procedure to be discontinued. About 10 mg of Depo-Medrol and 0.25 mL of lidocaine were injected into the joint capsule. No images were able to be saved due to patient motion.   Gregor Hams, MD 08/22/14 1013

## 2014-08-22 NOTE — ED Provider Notes (Signed)
Chief Complaint    Joint Pain   History of Present Illness     [name] is a A 49 year old female who presents today with right shoulder pain and left hip pain.  She states that she's had a spinal fusion in 1999 2000 with a graft from her left hip. She's also had a cervical fusion. These have been done by Dr. Louanne Skye. She's been walking with a cane. She takes hydrocodone and Percocet for her back and neck pain. She was recently seen in the emergency department for an episode of pancreatitis. The right shoulder and left hip pain been going on for about a week. She denies any injury. Right shoulder pain is rated 7-8/10 in intensity in the left hip pain 9-10 over 10 in intensity. It hurts to move the shoulder and the hip hurts when she walks. She is able to bear weight only with a cane. She denies any pain radiating down the arm or the leg. No numbness, tingling, or weakness. She's had no fever or chills.  Review of Systems     Other than as noted above, the patient denies any of the following symptoms: Systemic:  No fevers or chills.   Musculoskeletal:  No arthritis, back pain, or neck pain. Neurological:  No muscular weakness or paresthesias.  [place]    Past medical history, family history, social history, meds, and allergies were reviewed.  She has a history of fibromyalgia, rheumatoid arthritis, diabetes, and hypertension. Current meds include albuterol, a sore 5/40, Flexeril, Invokana, meloxicam, metformin, metoprolol, Percocet, phentermine, and simvastatin.  Physical Exam    Vital signs:  BP 143/76 mmHg  Pulse 105  SpO2 97% Gen:  Alert and oriented times 3.  In no distress. Musculoskeletal: There is tenderness to palpation over the before meals joint. There is no obvious swelling or deformity. Shoulder joint itself has a full range of motion, but she has pain with abduction across the chest. Exam of the hip reveals pain to palpation over the iliac crest. The hip joint has a full  range of motion with some pain.  Otherwise, all joints had a full a ROM with no swelling, bruising or deformity.  No edema, pulses full. Extremities were warm and pink.  Capillary refill was brisk.  Skin:  Clear, warm and dry.  No rash. Neuro:  Alert and oriented times 3.  Muscle strength was normal.  Sensation was intact to light touch.   Radiology     Dg Shoulder Right  08/22/2014   CLINICAL DATA:  Extreme right shoulder pain for 2 weeks.  EXAM: RIGHT SHOULDER - 2+ VIEW  COMPARISON:  Chest radiograph 03/11/2014.  FINDINGS: No acute osseous or joint abnormality. Degenerative changes are seen in the right acromioclavicular joint. Visualized portion of the right chest is unremarkable.  IMPRESSION: 1. No acute findings. 2. Right acromioclavicular joint osteoarthritis.   Electronically Signed   By: Lorin Picket M.D.   On: 08/22/2014 09:29   Dg Hip Unilat With Pelvis 2-3 Views Left  08/22/2014   CLINICAL DATA:  Left hip pain for 2 weeks.  EXAM: DG HIP W/ PELVIS 2-3V*L*  COMPARISON:  Coronal re-formatted images from CT abdomen pelvis 08/03/2014 and 12/24/2011.  FINDINGS: Hip joint space is maintained bilaterally. Mild osteophytosis and subchondral sclerosis/cyst formation along the acetabulum bilaterally. No significant subchondral sclerosis or cyst formation in the femoral heads. No acute osseous abnormality. Mild degenerative changes in the sacroiliac joints. Interbody cages are seen in the lower lumbar spine.  IMPRESSION: 1. No acute findings. 2. Mild bilateral hip osteoarthritis.   Electronically Signed   By: Lorin Picket M.D.   On: 08/22/2014 09:32    I reviewed the images independently and personally and concur with the radiologist's findings.   Course in Urgent [place]   The following medications were given:  Medications  ketorolac (TORADOL) injection 60 mg (60 mg Intramuscular Given 08/22/14 0846)  methylPREDNISolone acetate (DEPO-MEDROL) injection 20 mg (20 mg Intra-articular Given  08/22/14 0958)    Ultrasound of the shoulder was performed showing an effusion in the acromioclavicular joint. Injection of this joint was attempted, however the patient flinched, and its uncertain how much of the medication was actually deposited in the joint. This was injected with Depo-Medrol, 20 mg. This procedure was done by Dr. Lynne Leader.  Assessment    The primary encounter diagnosis was Osteoarthritis of acromioclavicular joint. Diagnoses of Shoulder pain and Hip pain were also pertinent to this visit.  The shoulder pain appears to be due to acromioclavicular joint arthritis which was demonstrated on the x-ray and the ultrasound. This is probably due to stresses placed on the joint by using the cane.  The hip pain could be due to osteoarthritis, labral tear, tendinitis, or bursitis. She'll need further evaluation, and I suggested seeing her orthopedist, Dr. Louanne Skye for an MRI scan of the hip.  Plan   1.  Meds:  The following meds were prescribed:   Discharge Medication List as of 08/22/2014 10:12 AM    START taking these medications   Details  meloxicam (MOBIC) 15 MG tablet Take 1 tablet (15 mg total) by mouth daily., Starting 08/22/2014, Until Discontinued, Normal    traMADol (ULTRAM) 50 MG tablet Take 2 tablets (100 mg total) by mouth every 8 (eight) hours as needed., Starting 08/22/2014, Until Discontinued, Print        2.  Patient Education/Counseling:  The patient was given appropriate handouts, self care instructions, and instructed in pain control, rest and activity, elevation, application of ice and compression.    3.  Follow up:  The patient was told to follow up here if no better in 3 to 4 days, or sooner if becoming worse in any way, and given some red flag symptoms such as worsening pain or new neurological symptoms which would prompt immediate return.     [name], MD 08/22/14 [phone]

## 2014-12-01 ENCOUNTER — Other Ambulatory Visit: Payer: Self-pay

## 2014-12-01 DIAGNOSIS — Z1231 Encounter for screening mammogram for malignant neoplasm of breast: Secondary | ICD-10-CM

## 2014-12-11 ENCOUNTER — Emergency Department (HOSPITAL_COMMUNITY)
Admission: EM | Admit: 2014-12-11 | Discharge: 2014-12-11 | Disposition: A | Discharge status: Home / Self Care | Payer: Medicare Other | Attending: Emergency Medicine | Admitting: Emergency Medicine

## 2014-12-11 ENCOUNTER — Encounter (HOSPITAL_COMMUNITY): Payer: Self-pay | Admitting: *Deleted

## 2014-12-11 DIAGNOSIS — G8929 Other chronic pain: Secondary | ICD-10-CM | POA: Diagnosis not present

## 2014-12-11 DIAGNOSIS — Z720 Tobacco use: Secondary | ICD-10-CM | POA: Insufficient documentation

## 2014-12-11 DIAGNOSIS — Z791 Long term (current) use of non-steroidal anti-inflammatories (NSAID): Secondary | ICD-10-CM | POA: Insufficient documentation

## 2014-12-11 DIAGNOSIS — Z79899 Other long term (current) drug therapy: Secondary | ICD-10-CM | POA: Diagnosis not present

## 2014-12-11 DIAGNOSIS — I10 Essential (primary) hypertension: Secondary | ICD-10-CM | POA: Diagnosis not present

## 2014-12-11 DIAGNOSIS — M25511 Pain in right shoulder: Secondary | ICD-10-CM | POA: Diagnosis not present

## 2014-12-11 DIAGNOSIS — Z8719 Personal history of other diseases of the digestive system: Secondary | ICD-10-CM | POA: Diagnosis not present

## 2014-12-11 DIAGNOSIS — E119 Type 2 diabetes mellitus without complications: Secondary | ICD-10-CM | POA: Insufficient documentation

## 2014-12-11 MED ORDER — LIDOCAINE 5 % EX PTCH: 1.0000 | MEDICATED_PATCH | CUTANEOUS | Status: DC

## 2014-12-11 MED ORDER — KETOROLAC TROMETHAMINE 60 MG/2ML IM SOLN
60.0000 mg | Freq: Once | INTRAMUSCULAR | Status: AC
  Administered 2014-12-11: 60 mg via INTRAMUSCULAR
  Filled 2014-12-11: qty 2

## 2014-12-11 MED ORDER — HYDROCODONE-ACETAMINOPHEN 5-325 MG PO TABS: 1.0000 | ORAL_TABLET | ORAL | Status: DC | PRN

## 2014-12-11 NOTE — ED Notes (Signed)
Pt reports pain increased to RT shoulder. Pt reports a RT shoulder pain due to torn rotator cuff and bone spurs. Pt has been to physical therapy and had injections to shoulder.Pain has been present for several months.

## 2014-12-11 NOTE — ED Notes (Signed)
Declined W/C at D/C and was escorted to lobby by RN. 

## 2014-12-11 NOTE — ED Provider Notes (Signed)
CSN: [id number]     Arrival date & time 12/11/14  0730 History   First MD Initiated Contact with Patient 12/11/14 0815     Chief Complaint  Patient presents with  . Shoulder Pain     (Consider location/radiation/quality/duration/timing/severity/associated sxs/prior Treatment) Patient is a 49 y.o. female presenting with shoulder pain. The history is provided by the patient and medical records.  Shoulder Pain Location:  Shoulder Time since incident: several months. Injury: no   Shoulder location:  R shoulder Pain details:    Quality:  Aching   Radiates to:  Does not radiate   Severity:  Severe   Timing:  Constant   Progression:  Worsening Chronicity:  Chronic Handedness:  Right-handed Dislocation: no   Prior injury to area:  No Relieved by:  Nothing Worsened by:  Movement Ineffective treatments:  NSAIDs, physical therapy, rest, narcotics, muscle relaxant, being still, ice and heat Associated symptoms: decreased range of motion   Associated symptoms: no back pain, no fatigue, no fever, no muscle weakness, no neck pain, no numbness, no stiffness, no swelling and no tingling        Past Medical History  Diagnosis Date  . Diabetes mellitus   . Hypertension   . Chronic back pain   . Pancreatitis   . Fibromyalgia    Past Surgical History  Procedure Laterality Date  . Abdominal hysterectomy    . Cesarean section    . Frontal fusion    . Carpal tunnel release    . Bone graft hip iliac crest    . Neck fusion      patient reported  . Back surgery     History reviewed. No pertinent family history. History  Substance Use Topics  . Smoking status: Current Every Day Smoker    Types: Cigarettes  . Smokeless tobacco: Not on file  . Alcohol Use: No   OB History    No data available     Review of Systems  Constitutional: Negative for fever and fatigue.  Musculoskeletal: Negative for back pain, stiffness and neck pain.  All other systems reviewed and are  negative.     Allergies  Review of patient's allergies indicates no known allergies.  Home Medications   Prior to Admission medications   Medication Sig Start Date End Date Taking? Authorizing Provider  albuterol (PROVENTIL HFA;VENTOLIN HFA) 108 (90 BASE) MCG/ACT inhaler Inhale 2 puffs into the lungs every 4 (four) hours as needed for wheezing.    Historical Provider, MD  AZOR 5-40 MG per tablet Take 1 tablet by mouth daily.  02/06/14   Historical Provider, MD  cyclobenzaprine (FLEXERIL) 10 MG tablet Take 10 mg by mouth at bedtime. 07/19/14   Historical Provider, MD  INVOKANA 100 MG TABS tablet Take 100 mg by mouth daily. 07/22/14   Historical Provider, MD  meloxicam (MOBIC) 15 MG tablet Take 1 tablet (15 mg total) by mouth daily. 08/22/14   [name], MD  metFORMIN (GLUCOPHAGE) 500 MG tablet Take 500 mg by mouth 2 (two) times daily with a meal.    Historical Provider, MD  metoprolol succinate (TOPROL-XL) 50 MG 24 hr tablet Take 50 mg by mouth daily. 07/21/14   Historical Provider, MD  ondansetron (ZOFRAN) 4 MG tablet Take 1 tablet (4 mg total) by mouth every 6 (six) hours. 08/03/14   [name], MD  oxyCODONE-acetaminophen (PERCOCET/ROXICET) 5-325 MG per tablet Take 1 tablet by mouth every 4 (four) hours as needed for moderate pain or severe pain.  08/03/14   [name], MD  phentermine (ADIPEX-P) 37.5 MG tablet Take 37.5 mg by mouth daily. 07/17/14   Historical Provider, MD  simvastatin (ZOCOR) 20 MG tablet Take 20 mg by mouth at bedtime.      Historical Provider, MD  traMADol (ULTRAM) 50 MG tablet Take 2 tablets (100 mg total) by mouth every 8 (eight) hours as needed. 08/22/14   [name], MD   BP 143/97 mmHg  Pulse 97  Temp(Src) 97.7 F (36.5 C) (Oral)  Resp 22  Ht 5\' 4"  (1.626 m)  Wt 212 lb 3 oz (96.248 kg)  BMI 36.40 kg/m2  SpO2 98% Physical Exam  Constitutional: She is oriented to person, place, and time. She appears well-developed and well-nourished. No distress.   tearful  HENT:  Head: Normocephalic and atraumatic.  Eyes: Conjunctivae are normal. No scleral icterus.  Neck: Normal range of motion.  Cardiovascular: Normal rate, regular rhythm and normal heart sounds.  Exam reveals no gallop and no friction rub.   No murmur heard. Pulmonary/Chest: Effort normal and breath sounds normal. No respiratory distress.  Abdominal: Soft. Bowel sounds are normal. She exhibits no distension and no mass. There is no tenderness. There is no guarding.  Musculoskeletal:  Patient holding arm in sling position under her dress, unable to lift the arm to pull it through the sleeve due to pain point tenderness over the [facility] joint. Unable to range the arm active or passive due to pain. Strong grip, pulse intatct   Neurological: She is alert and oriented to person, place, and time.  Skin: Skin is warm and dry. She is not diaphoretic.  Nursing note and vitals reviewed.   ED Course  Procedures (including critical care time) Labs Review Labs Reviewed - No data to display  Imaging Review No results found.   EKG Interpretation None      MDM   Final diagnoses:  None    8:44 AM BP 143/97 mmHg  Pulse 97  Temp(Src) 97.7 F (36.5 C) (Oral)  Resp 22  Ht 5\' 4"  (1.626 m)  Wt 212 lb 3 oz (96.248 kg)  BMI 36.40 kg/m2  SpO2 98% Patient with chronic shoulder pain. Patient is apparently in severe pain today. Unable to sleep at night. She is very tearful. Patient has been through physical therapy. She's had joint injections. She's not having any relief of her symptoms. She's been seen at [facility] for the same problem. I spoke with the patient advised it is not much more that she will be able to do especially in the emergency department. Patient states that she is hoping she can just get a little bit of relief. She prefers not to take narcotic medications, but states that she has been unable to sleep. She uses a walker and also has tendinitis in the left  elbow, so ADLs have been very difficult. We'll discharge patient with a sling. I would've him range of motion exercises such as pendulum's. Patient is advised to follow closely with orthopedics.    [name], PA-C 12/11/14 0277  Malvin Johns, MD 12/11/14 [phone]

## 2014-12-11 NOTE — ED Notes (Signed)
Pt thinks her rotator cuff is bothering her

## 2014-12-11 NOTE — Discharge Instructions (Signed)

## 2014-12-26 ENCOUNTER — Other Ambulatory Visit: Payer: Self-pay

## 2014-12-26 ENCOUNTER — Ambulatory Visit
Admission: RE | Admit: 2014-12-26 | Discharge: 2014-12-26 | Disposition: A | Discharge status: Home / Self Care | Payer: Medicare Other | Source: Ambulatory Visit

## 2014-12-26 DIAGNOSIS — Z1231 Encounter for screening mammogram for malignant neoplasm of breast: Secondary | ICD-10-CM

## 2015-01-28 ENCOUNTER — Emergency Department (INDEPENDENT_AMBULATORY_CARE_PROVIDER_SITE_OTHER)
Admission: EM | Admit: 2015-01-28 | Discharge: 2015-01-28 | Disposition: A | Discharge status: Home / Self Care | Payer: Medicare Other | Source: Home / Self Care | Attending: Family Medicine | Admitting: Family Medicine

## 2015-01-28 ENCOUNTER — Encounter (HOSPITAL_COMMUNITY): Payer: Self-pay | Admitting: *Deleted

## 2015-01-28 DIAGNOSIS — J029 Acute pharyngitis, unspecified: Secondary | ICD-10-CM | POA: Diagnosis not present

## 2015-01-28 MED ORDER — AMOXICILLIN 500 MG PO CAPS: 500.0000 mg | ORAL_CAPSULE | Freq: Three times a day (TID) | ORAL | Status: DC

## 2015-01-28 MED ORDER — IPRATROPIUM BROMIDE 0.06 % NA SOLN: 2.0000 | Freq: Four times a day (QID) | NASAL | Status: DC

## 2015-01-28 NOTE — ED Notes (Signed)
Pt  Reports  Symptoms  Of  sorethroat  And  Pain   When  She  Swallows       Pt    Is  Sitting  Upright  On the  Exam table  Speaking in  Complete   sentances         In no  Severe   Distress

## 2015-01-28 NOTE — ED Provider Notes (Signed)
CSN: [id number]     Arrival date & time 01/28/15  1736 History   First MD Initiated Contact with Patient 01/28/15 1754     Chief Complaint  Patient presents with  . Sore Throat   (Consider location/radiation/quality/duration/timing/severity/associated sxs/prior Treatment) Patient is a 49 y.o. female presenting with pharyngitis. The history is provided by the patient.  Sore Throat This is a new problem. The current episode started more than 2 days ago. The problem has been gradually worsening. Pertinent negatives include no chest pain and no abdominal pain. Associated symptoms comments: Grandson similarly ill and was treated with amox.. The symptoms are aggravated by swallowing.    Past Medical History  Diagnosis Date  . Diabetes mellitus   . Hypertension   . Chronic back pain   . Pancreatitis   . Fibromyalgia    Past Surgical History  Procedure Laterality Date  . Abdominal hysterectomy    . Cesarean section    . Frontal fusion    . Carpal tunnel release    . Bone graft hip iliac crest    . Neck fusion      patient reported  . Back surgery     History reviewed. No pertinent family history. History  Substance Use Topics  . Smoking status: Current Every Day Smoker    Types: Cigarettes  . Smokeless tobacco: Not on file  . Alcohol Use: No   OB History    No data available     Review of Systems  Constitutional: Negative.  Negative for fever.  HENT: Positive for postnasal drip, rhinorrhea and sore throat.   Respiratory: Negative.   Cardiovascular: Negative.  Negative for chest pain.  Gastrointestinal: Negative.  Negative for abdominal pain.  Skin: Negative.     Allergies  Review of patient's allergies indicates no known allergies.  Home Medications   Prior to Admission medications   Medication Sig Start Date End Date Taking? Authorizing Provider  albuterol (PROVENTIL HFA;VENTOLIN HFA) 108 (90 BASE) MCG/ACT inhaler Inhale 2 puffs into the lungs every 4 (four) hours  as needed for wheezing.    Historical Provider, MD  amoxicillin (AMOXIL) 500 MG capsule Take 1 capsule (500 mg total) by mouth 3 (three) times daily. 01/28/15   [name], MD  AZOR 5-40 MG per tablet Take 1 tablet by mouth daily.  02/06/14   Historical Provider, MD  cyclobenzaprine (FLEXERIL) 10 MG tablet Take 10 mg by mouth at bedtime. 07/19/14   Historical Provider, MD  HYDROcodone-acetaminophen (NORCO) 5-325 MG per tablet Take 1 tablet by mouth every 4 (four) hours as needed. 12/11/14   [name], PA-C  INVOKANA 100 MG TABS tablet Take 100 mg by mouth daily. 07/22/14   Historical Provider, MD  ipratropium (ATROVENT) 0.06 % nasal spray Place 2 sprays into both nostrils 4 (four) times daily. 01/28/15   [name], MD  lidocaine (LIDODERM) 5 % Place 1 patch onto the skin daily. Remove & Discard patch within 12 hours or as directed by MD 12/11/14   [name], PA-C  meloxicam (MOBIC) 15 MG tablet Take 1 tablet (15 mg total) by mouth daily. 08/22/14   [name], MD  metFORMIN (GLUCOPHAGE) 500 MG tablet Take 500 mg by mouth 2 (two) times daily with a meal.    Historical Provider, MD  metoprolol succinate (TOPROL-XL) 50 MG 24 hr tablet Take 50 mg by mouth daily. 07/21/14   Historical Provider, MD  ondansetron (ZOFRAN) 4 MG tablet Take 1 tablet (4 mg  total) by mouth every 6 (six) hours. 08/03/14   [name], MD  phentermine (ADIPEX-P) 37.5 MG tablet Take 37.5 mg by mouth daily. 07/17/14   Historical Provider, MD  simvastatin (ZOCOR) 20 MG tablet Take 20 mg by mouth at bedtime.      Historical Provider, MD   BP 138/78 mmHg  Pulse 108  Temp(Src) 98.1 F (36.7 C) (Oral)  Resp 12  SpO2 96% Physical Exam  Constitutional: She is oriented to person, place, and time. She appears well-developed and well-nourished. No distress.  HENT:  Right Ear: External ear normal.  Left Ear: External ear normal.  Mouth/Throat: Oropharynx is clear and moist.  Eyes: Conjunctivae and EOM are normal. Pupils  are equal, round, and reactive to light.  Neck: Normal range of motion. Neck supple.  Cardiovascular: Normal rate, regular rhythm, normal heart sounds and intact distal pulses.   Pulmonary/Chest: Effort normal and breath sounds normal.  Lymphadenopathy:    She has no cervical adenopathy.  Neurological: She is alert and oriented to person, place, and time.  Skin: Skin is warm and dry.  Nursing note and vitals reviewed.   ED Course  Procedures (including critical care time) Labs Review Labs Reviewed - No data to display  Imaging Review No results found.   MDM   1. Acute pharyngitis, unspecified pharyngitis type        [name], MD 01/28/15 [phone]

## 2015-06-10 ENCOUNTER — Emergency Department (HOSPITAL_COMMUNITY): Payer: Medicare Other

## 2015-06-10 ENCOUNTER — Encounter (HOSPITAL_COMMUNITY): Payer: Self-pay | Admitting: Emergency Medicine

## 2015-06-10 ENCOUNTER — Emergency Department (HOSPITAL_COMMUNITY)
Admission: EM | Admit: 2015-06-10 | Discharge: 2015-06-10 | Disposition: A | Discharge status: Home / Self Care | Payer: Medicare Other | Attending: Emergency Medicine | Admitting: Emergency Medicine

## 2015-06-10 DIAGNOSIS — Z792 Long term (current) use of antibiotics: Secondary | ICD-10-CM | POA: Diagnosis not present

## 2015-06-10 DIAGNOSIS — I10 Essential (primary) hypertension: Secondary | ICD-10-CM | POA: Insufficient documentation

## 2015-06-10 DIAGNOSIS — G8929 Other chronic pain: Secondary | ICD-10-CM | POA: Insufficient documentation

## 2015-06-10 DIAGNOSIS — Z8739 Personal history of other diseases of the musculoskeletal system and connective tissue: Secondary | ICD-10-CM | POA: Insufficient documentation

## 2015-06-10 DIAGNOSIS — Z79899 Other long term (current) drug therapy: Secondary | ICD-10-CM | POA: Diagnosis not present

## 2015-06-10 DIAGNOSIS — E119 Type 2 diabetes mellitus without complications: Secondary | ICD-10-CM | POA: Insufficient documentation

## 2015-06-10 DIAGNOSIS — F419 Anxiety disorder, unspecified: Secondary | ICD-10-CM | POA: Insufficient documentation

## 2015-06-10 DIAGNOSIS — Z791 Long term (current) use of non-steroidal anti-inflammatories (NSAID): Secondary | ICD-10-CM | POA: Insufficient documentation

## 2015-06-10 DIAGNOSIS — K859 Acute pancreatitis without necrosis or infection, unspecified: Secondary | ICD-10-CM

## 2015-06-10 DIAGNOSIS — R1013 Epigastric pain: Secondary | ICD-10-CM | POA: Diagnosis present

## 2015-06-10 DIAGNOSIS — Z720 Tobacco use: Secondary | ICD-10-CM | POA: Insufficient documentation

## 2015-06-10 LAB — URINALYSIS, ROUTINE W REFLEX MICROSCOPIC
Bilirubin Urine: NEGATIVE
Glucose, UA: >1000 mg/dL — AB
Ketones, ur: NEGATIVE mg/dL
Leukocytes, UA: NEGATIVE
Nitrite: NEGATIVE
PROTEIN: NEGATIVE mg/dL
SPECIFIC GRAVITY, URINE: 1.043 — AB (ref 1.005–1.030)
Urobilinogen, UA: 0.2 mg/dL (ref 0.0–1.0)
pH: 5.5 (ref 5.0–8.0)

## 2015-06-10 LAB — COMPREHENSIVE METABOLIC PANEL
ALT: 16 U/L (ref 14–54)
AST: 16 U/L (ref 15–41)
Albumin: 3.9 g/dL (ref 3.5–5.0)
Alkaline Phosphatase: 92 U/L (ref 38–126)
Anion gap: 11 (ref 5–15)
BILIRUBIN TOTAL: 0.6 mg/dL (ref 0.3–1.2)
BUN: 10 mg/dL (ref 6–20)
CHLORIDE: 101 mmol/L (ref 101–111)
CO2: 27 mmol/L (ref 22–32)
Calcium: 9.7 mg/dL (ref 8.9–10.3)
Creatinine, Ser: 0.69 mg/dL (ref 0.44–1.00)
GFR calc Af Amer: >60 mL/min (ref >60)
GFR calc non Af Amer: >60 mL/min (ref >60)
Glucose, Bld: 181 mg/dL — ABNORMAL HIGH (ref 65–99)
POTASSIUM: 4.2 mmol/L (ref 3.5–5.1)
Sodium: 139 mmol/L (ref 135–145)
Total Protein: 7 g/dL (ref 6.5–8.1)

## 2015-06-10 LAB — CBC
HEMATOCRIT: 44.9 % (ref 36.0–46.0)
Hemoglobin: 15.4 g/dL — ABNORMAL HIGH (ref 12.0–15.0)
MCH: 28.9 pg (ref 26.0–34.0)
MCHC: 34.3 g/dL (ref 30.0–36.0)
MCV: 84.2 fL (ref 78.0–100.0)
Platelets: 303 10*3/uL (ref 150–400)
RBC: 5.33 MIL/uL — ABNORMAL HIGH (ref 3.87–5.11)
RDW: 13.9 % (ref 11.5–15.5)
WBC: 10.8 10*3/uL — ABNORMAL HIGH (ref 4.0–10.5)

## 2015-06-10 LAB — LIPASE, BLOOD: LIPASE: 57 U/L — AB (ref 11–51)

## 2015-06-10 LAB — URINE MICROSCOPIC-ADD ON

## 2015-06-10 MED ORDER — IOHEXOL 300 MG/ML  SOLN
100.0000 mL | Freq: Once | INTRAMUSCULAR | Status: AC | PRN
  Administered 2015-06-10: 100 mL via INTRAVENOUS

## 2015-06-10 MED ORDER — IOHEXOL 300 MG/ML  SOLN: 25.0000 mL | Freq: Once | INTRAMUSCULAR | Status: DC | PRN

## 2015-06-10 MED ORDER — OXYCODONE-ACETAMINOPHEN 5-325 MG PO TABS: 1.0000 | ORAL_TABLET | Freq: Four times a day (QID) | ORAL | Status: DC | PRN

## 2015-06-10 MED ORDER — MORPHINE SULFATE (PF) 4 MG/ML IV SOLN
4.0000 mg | Freq: Once | INTRAVENOUS | Status: AC
  Administered 2015-06-10: 4 mg via INTRAVENOUS
  Filled 2015-06-10: qty 1

## 2015-06-10 MED ORDER — ONDANSETRON HCL 4 MG/2ML IJ SOLN
4.0000 mg | Freq: Once | INTRAMUSCULAR | Status: AC
  Administered 2015-06-10: 4 mg via INTRAVENOUS
  Filled 2015-06-10: qty 2

## 2015-06-10 MED ORDER — SODIUM CHLORIDE 0.9 % IV BOLUS (SEPSIS)
1000.0000 mL | Freq: Once | INTRAVENOUS | Status: AC
Start: 2015-06-10 — End: 2015-06-10
  Administered 2015-06-10: 1000 mL via INTRAVENOUS

## 2015-06-10 NOTE — ED Notes (Signed)
Pt. reports RUQ pain with emesis and diarrhea onset yesterday afternoon , pt. stated pain similar to her pancreatitis , denies fever or chills.

## 2015-06-10 NOTE — ED Provider Notes (Signed)
CSN: [id number]     Arrival date & time 06/10/15  0115 History  By signing my name below, I, [name], attest that this documentation has been prepared under the direction and in the presence of [name], MD. Electronically Signed: [name], ED Scribe. 06/10/2015. 2:18 AM.     Chief Complaint  Patient presents with  . Pancreatitis      The history is provided by the patient. No language interpreter was used.   HPI Comments: [name] is a 49 y.o. female who has a hx of pancreatitis presents to the Emergency Department complaining of constant, gradual worsening epigastrium abdominal pain onset 2 days ago. She endorses associated emesis and diarrhea. Pt states she has had similar episodes in the past. She notes the last time she had this pain was awhile ago. She has not taken any medication to alleviate the pain. Pt has a surgical hx of cholecystectomy.   Past Medical History  Diagnosis Date  . Diabetes mellitus   . Hypertension   . Chronic back pain   . Pancreatitis   . Fibromyalgia    Past Surgical History  Procedure Laterality Date  . Abdominal hysterectomy    . Cesarean section    . Frontal fusion    . Carpal tunnel release    . Bone graft hip iliac crest    . Neck fusion      patient reported  . Back surgery     No family history on file. Social History  Substance Use Topics  . Smoking status: Current Every Day Smoker    Types: Cigarettes  . Smokeless tobacco: None  . Alcohol Use: No   OB History    No data available     Review of Systems  Gastrointestinal: Positive for vomiting, abdominal pain and diarrhea.  All other systems reviewed and are negative.     Allergies  Review of patient's allergies indicates no known allergies.  Home Medications   Prior to Admission medications   Medication Sig Start Date End Date Taking? Authorizing Provider  albuterol (PROVENTIL HFA;VENTOLIN HFA) 108 (90 BASE) MCG/ACT inhaler Inhale 2 puffs into the  lungs every 4 (four) hours as needed for wheezing.    Historical Provider, MD  amoxicillin (AMOXIL) 500 MG capsule Take 1 capsule (500 mg total) by mouth 3 (three) times daily. 01/28/15   [name], MD  AZOR 5-40 MG per tablet Take 1 tablet by mouth daily.  02/06/14   Historical Provider, MD  cyclobenzaprine (FLEXERIL) 10 MG tablet Take 10 mg by mouth at bedtime. 07/19/14   Historical Provider, MD  HYDROcodone-acetaminophen (NORCO) 5-325 MG per tablet Take 1 tablet by mouth every 4 (four) hours as needed. 12/11/14   [name], PA-C  INVOKANA 100 MG TABS tablet Take 100 mg by mouth daily. 07/22/14   Historical Provider, MD  ipratropium (ATROVENT) 0.06 % nasal spray Place 2 sprays into both nostrils 4 (four) times daily. 01/28/15   [name], MD  lidocaine (LIDODERM) 5 % Place 1 patch onto the skin daily. Remove & Discard patch within 12 hours or as directed by MD 12/11/14   [name], PA-C  meloxicam (MOBIC) 15 MG tablet Take 1 tablet (15 mg total) by mouth daily. 08/22/14   [name], MD  metFORMIN (GLUCOPHAGE) 500 MG tablet Take 500 mg by mouth 2 (two) times daily with a meal.    Historical Provider, MD  metoprolol succinate (TOPROL-XL) 50 MG 24 hr tablet  Take 50 mg by mouth daily. 07/21/14   Historical Provider, MD  ondansetron (ZOFRAN) 4 MG tablet Take 1 tablet (4 mg total) by mouth every 6 (six) hours. 08/03/14   [name], MD  phentermine (ADIPEX-P) 37.5 MG tablet Take 37.5 mg by mouth daily. 07/17/14   Historical Provider, MD  simvastatin (ZOCOR) 20 MG tablet Take 20 mg by mouth at bedtime.      Historical Provider, MD   Triage vitals: BP 149/99 mmHg  Pulse 115  Temp(Src) 97.7 F (36.5 C) (Oral)  Resp 24  SpO2 96% Physical Exam  Constitutional: She is oriented to person, place, and time. She appears well-developed and well-nourished. No distress.  Pt appears tearful and anxious.  HENT:  Head: Normocephalic and atraumatic.  Eyes: EOM are normal.  Neck: Normal range of  motion.  Cardiovascular: Normal rate, regular rhythm and normal heart sounds.   Pulmonary/Chest: Effort normal and breath sounds normal.  Abdominal: Soft. She exhibits no distension. There is tenderness.  TTP in epigastrium, there is voluntary guarding but no rebound tenderness.   Musculoskeletal: Normal range of motion.  Neurological: She is alert and oriented to person, place, and time.  Skin: Skin is warm and dry.  Psychiatric: She has a normal mood and affect. Judgment normal.  Nursing note and vitals reviewed.   ED Course  Procedures  DIAGNOSTIC STUDIES: Oxygen Saturation is 96% on RA, adequate by my interpretation.  COORDINATION OF CARE:  2:16 AM Discussed treatment plan which includes CT of abdomen with pt at bedside and pt agreed to plan.  Labs Review Labs Reviewed  LIPASE, BLOOD - Abnormal; Notable for the following:    Lipase 57 (*)    All other components within normal limits  COMPREHENSIVE METABOLIC PANEL - Abnormal; Notable for the following:    Glucose, Bld 181 (*)    All other components within normal limits  CBC - Abnormal; Notable for the following:    WBC 10.8 (*)    RBC 5.33 (*)    Hemoglobin 15.4 (*)    All other components within normal limits  URINALYSIS, ROUTINE W REFLEX MICROSCOPIC (NOT AT [facility])    Imaging Review No results found. I have personally reviewed and evaluated these images and lab results as part of my medical decision-making.   EKG Interpretation None      MDM   Final diagnoses:  None   Patient presents with complaints of epigastric pain. She has a history of pancreatitis and this feels the same. Her lipase is slightly elevated and CT scan reveals mild pancreatitis. She is afebrile, nontoxic appearing, and she has a very mild leukocytosis. She is feeling better after fluids and pain medication and I believe is appropriate for discharge. She will be given pain medication, advised to adhere to a clear liquid diet, and follow-up with  her primary doctor next week.  I personally performed the services described in this documentation, which was scribed in my presence. The recorded information has been reviewed and is accurate.       [name], MD 06/10/15 [phone]

## 2015-06-10 NOTE — Discharge Instructions (Signed)
Percocet as prescribed as needed for pain.  Adhere to a clear liquid diet for the next 48 hours, then slowly advance your diet to normal.  Return to the emergency department if your symptoms significantly worsen or change.

## 2015-06-10 NOTE — ED Notes (Signed)
Patient transported to CT 

## 2015-06-15 ENCOUNTER — Encounter (HOSPITAL_COMMUNITY): Payer: Self-pay | Admitting: *Deleted

## 2015-06-15 ENCOUNTER — Emergency Department (HOSPITAL_COMMUNITY)
Admission: EM | Admit: 2015-06-15 | Discharge: 2015-06-15 | Disposition: A | Discharge status: Home / Self Care | Payer: Medicare Other | Attending: Emergency Medicine | Admitting: Emergency Medicine

## 2015-06-15 ENCOUNTER — Emergency Department (HOSPITAL_COMMUNITY): Payer: Medicare Other

## 2015-06-15 DIAGNOSIS — Z79899 Other long term (current) drug therapy: Secondary | ICD-10-CM | POA: Diagnosis not present

## 2015-06-15 DIAGNOSIS — I10 Essential (primary) hypertension: Secondary | ICD-10-CM | POA: Insufficient documentation

## 2015-06-15 DIAGNOSIS — E119 Type 2 diabetes mellitus without complications: Secondary | ICD-10-CM | POA: Diagnosis not present

## 2015-06-15 DIAGNOSIS — G8929 Other chronic pain: Secondary | ICD-10-CM | POA: Insufficient documentation

## 2015-06-15 DIAGNOSIS — Z720 Tobacco use: Secondary | ICD-10-CM | POA: Diagnosis not present

## 2015-06-15 DIAGNOSIS — Z8719 Personal history of other diseases of the digestive system: Secondary | ICD-10-CM | POA: Diagnosis not present

## 2015-06-15 DIAGNOSIS — R1013 Epigastric pain: Secondary | ICD-10-CM | POA: Diagnosis present

## 2015-06-15 DIAGNOSIS — R112 Nausea with vomiting, unspecified: Secondary | ICD-10-CM | POA: Diagnosis not present

## 2015-06-15 DIAGNOSIS — R197 Diarrhea, unspecified: Secondary | ICD-10-CM | POA: Diagnosis not present

## 2015-06-15 LAB — COMPREHENSIVE METABOLIC PANEL
ALBUMIN: 4.1 g/dL (ref 3.5–5.0)
ALT: 16 U/L (ref 14–54)
ANION GAP: 13 (ref 5–15)
AST: 16 U/L (ref 15–41)
Alkaline Phosphatase: 83 U/L (ref 38–126)
BILIRUBIN TOTAL: 0.8 mg/dL (ref 0.3–1.2)
BUN: 5 mg/dL — AB (ref 6–20)
CHLORIDE: 98 mmol/L — AB (ref 101–111)
CO2: 25 mmol/L (ref 22–32)
Calcium: 9.6 mg/dL (ref 8.9–10.3)
Creatinine, Ser: 0.66 mg/dL (ref 0.44–1.00)
GFR calc Af Amer: >60 mL/min (ref >60)
GFR calc non Af Amer: >60 mL/min (ref >60)
GLUCOSE: 144 mg/dL — AB (ref 65–99)
POTASSIUM: 4 mmol/L (ref 3.5–5.1)
SODIUM: 136 mmol/L (ref 135–145)
Total Protein: 7.1 g/dL (ref 6.5–8.1)

## 2015-06-15 LAB — URINALYSIS, ROUTINE W REFLEX MICROSCOPIC
Bilirubin Urine: NEGATIVE
Glucose, UA: >1000 mg/dL — AB
Ketones, ur: 40 mg/dL — AB
Leukocytes, UA: NEGATIVE
NITRITE: NEGATIVE
Protein, ur: NEGATIVE mg/dL
SPECIFIC GRAVITY, URINE: 1.023 (ref 1.005–1.030)
Urobilinogen, UA: 0.2 mg/dL (ref 0.0–1.0)
pH: 5.5 (ref 5.0–8.0)

## 2015-06-15 LAB — CBC WITH DIFFERENTIAL/PLATELET
BASOS ABS: 0 10*3/uL (ref 0.0–0.1)
BASOS PCT: 0 %
EOS ABS: 0.1 10*3/uL (ref 0.0–0.7)
Eosinophils Relative: 1 %
HEMATOCRIT: 45.2 % (ref 36.0–46.0)
Hemoglobin: 15.7 g/dL — ABNORMAL HIGH (ref 12.0–15.0)
Lymphocytes Relative: 28 %
Lymphs Abs: 2.4 10*3/uL (ref 0.7–4.0)
MCH: 29.1 pg (ref 26.0–34.0)
MCHC: 34.7 g/dL (ref 30.0–36.0)
MCV: 83.7 fL (ref 78.0–100.0)
MONO ABS: 0.7 10*3/uL (ref 0.1–1.0)
Monocytes Relative: 8 %
NEUTROS ABS: 5.5 10*3/uL (ref 1.7–7.7)
Neutrophils Relative %: 63 %
PLATELETS: 280 10*3/uL (ref 150–400)
RBC: 5.4 MIL/uL — ABNORMAL HIGH (ref 3.87–5.11)
RDW: 13.6 % (ref 11.5–15.5)
WBC: 8.6 10*3/uL (ref 4.0–10.5)

## 2015-06-15 LAB — URINE MICROSCOPIC-ADD ON

## 2015-06-15 LAB — TROPONIN I: Troponin I: <0.03 ng/mL (ref <0.031)

## 2015-06-15 LAB — LIPASE, BLOOD: Lipase: 43 U/L (ref 11–51)

## 2015-06-15 LAB — AMYLASE: AMYLASE: 66 U/L (ref 28–100)

## 2015-06-15 MED ORDER — HYDROMORPHONE HCL 1 MG/ML IJ SOLN
1.0000 mg | Freq: Once | INTRAMUSCULAR | Status: AC
  Administered 2015-06-15: 1 mg via INTRAVENOUS
  Filled 2015-06-15: qty 1

## 2015-06-15 MED ORDER — SODIUM CHLORIDE 0.9 % IV BOLUS (SEPSIS)
1000.0000 mL | Freq: Once | INTRAVENOUS | Status: AC
  Administered 2015-06-15: 1000 mL via INTRAVENOUS

## 2015-06-15 MED ORDER — MORPHINE SULFATE (PF) 4 MG/ML IV SOLN
4.0000 mg | Freq: Once | INTRAVENOUS | Status: AC
  Administered 2015-06-15: 4 mg via INTRAVENOUS
  Filled 2015-06-15: qty 1

## 2015-06-15 MED ORDER — ONDANSETRON HCL 4 MG/2ML IJ SOLN
4.0000 mg | Freq: Once | INTRAMUSCULAR | Status: AC
  Administered 2015-06-15: 4 mg via INTRAVENOUS
  Filled 2015-06-15: qty 2

## 2015-06-15 MED ORDER — IOHEXOL 300 MG/ML  SOLN
100.0000 mL | Freq: Once | INTRAMUSCULAR | Status: AC | PRN
  Administered 2015-06-15: 100 mL via INTRAVENOUS

## 2015-06-15 NOTE — Discharge Instructions (Signed)
You have been seen today for pancreatitis. Your imaging and lab tests showed no abnormalities. Follow up with PCP as scheduled. Return to ED should symptoms worsen.

## 2015-06-15 NOTE — ED Notes (Signed)
Patient transported to CT 

## 2015-06-15 NOTE — ED Notes (Signed)
Opt reports ABD pain unchanged since 06-10-15. Pt was seen and treated in ED for same on 06-10-15 . Pt does report a Hx of recurrent Pancreatitis.

## 2015-06-15 NOTE — ED Provider Notes (Signed)
CSN: [id number]     Arrival date & time 06/15/15  0803 History   First MD Initiated Contact with Patient 06/15/15 [phone]     Chief Complaint  Patient presents with  . Abdominal Pain     (Consider location/radiation/quality/duration/timing/severity/associated sxs/prior Treatment) HPI   PEARLY BARTOSIK is a 49 y.o. female, pt with history of DM, hysterectomy and cholecystectomy, and Pancreatitis, presenting to the ED with abdominal pain that began around Oct 26. Pt was seen and treated for the same complaint on Oct 29. Pain is epigastric, radiates to her left flank, rated 10/10, sharp in nature. Pt states this pain feels like her pancreatitis. Accompanied by nausea and diarrhea. Pt states she has not been able to keep much food or liquids down for the past week or so. Denies fever/chills, constipation, hematochezia, chest pain, shortness of breath, or any other pain or complaints.     Past Medical History  Diagnosis Date  . Diabetes mellitus   . Hypertension   . Chronic back pain   . Pancreatitis   . Fibromyalgia    Past Surgical History  Procedure Laterality Date  . Abdominal hysterectomy    . Cesarean section    . Frontal fusion    . Carpal tunnel release    . Bone graft hip iliac crest    . Neck fusion      patient reported  . Back surgery     History reviewed. No pertinent family history. Social History  Substance Use Topics  . Smoking status: Current Every Day Smoker    Types: Cigarettes  . Smokeless tobacco: None  . Alcohol Use: No   OB History    No data available     Review of Systems  Constitutional: Negative for fever, chills, diaphoresis and unexpected weight change.  Respiratory: Negative for cough, chest tightness and shortness of breath.   Cardiovascular: Negative for chest pain, palpitations and leg swelling.  Gastrointestinal: Positive for nausea, vomiting, abdominal pain and diarrhea. Negative for constipation.  Genitourinary: Negative for dysuria and  flank pain.  Musculoskeletal: Negative for back pain.  Skin: Negative for color change and pallor.  Neurological: Negative for dizziness, syncope, weakness and light-headedness.  All other systems reviewed and are negative.     Allergies  Review of patient's allergies indicates no known allergies.  Home Medications   Prior to Admission medications   Medication Sig Start Date End Date Taking? Authorizing Provider  ALPRAZolam Duanne Moron) 0.5 MG tablet Take 0.5 mg by mouth 3 (three) times daily as needed for anxiety.   Yes Historical Provider, MD  AZOR 5-40 MG per tablet Take 1 tablet by mouth daily.  02/06/14  Yes Historical Provider, MD  cyclobenzaprine (FLEXERIL) 10 MG tablet Take 10 mg by mouth at bedtime as needed for muscle spasms.  07/19/14  Yes Historical Provider, MD  INVOKANA 100 MG TABS tablet Take 100 mg by mouth daily. 07/22/14  Yes Historical Provider, MD  metFORMIN (GLUCOPHAGE) 500 MG tablet Take 500 mg by mouth 2 (two) times daily with a meal.   Yes Historical Provider, MD  metoprolol succinate (TOPROL-XL) 50 MG 24 hr tablet Take 50 mg by mouth daily. 07/21/14  Yes Historical Provider, MD  oxyCODONE-acetaminophen (PERCOCET) 5-325 MG tablet Take 1-2 tablets by mouth every 6 (six) hours as needed. 06/10/15  Yes [name], MD  phentermine (ADIPEX-P) 37.5 MG tablet Take 37.5 mg by mouth daily. 07/17/14  Yes Historical Provider, MD  simvastatin (ZOCOR) 20 MG tablet Take 20  mg by mouth at bedtime.     Yes Historical Provider, MD  albuterol (PROVENTIL HFA;VENTOLIN HFA) 108 (90 BASE) MCG/ACT inhaler Inhale 2 puffs into the lungs every 4 (four) hours as needed for wheezing.    Historical Provider, MD  amoxicillin (AMOXIL) 500 MG capsule Take 1 capsule (500 mg total) by mouth 3 (three) times daily. Patient not taking: Reported on 06/15/2015 01/28/15   [name], MD  HYDROcodone-acetaminophen [facility]) 5-325 MG per tablet Take 1 tablet by mouth every 4 (four) hours as needed. Patient not  taking: Reported on 06/15/2015 12/11/14   [name], PA-C  ipratropium (ATROVENT) 0.06 % nasal spray Place 2 sprays into both nostrils 4 (four) times daily. Patient not taking: Reported on 06/15/2015 01/28/15   [name], MD  lidocaine (LIDODERM) 5 % Place 1 patch onto the skin daily. Remove & Discard patch within 12 hours or as directed by MD Patient not taking: Reported on 06/15/2015 12/11/14   [name], PA-C  meloxicam (MOBIC) 15 MG tablet Take 1 tablet (15 mg total) by mouth daily. Patient not taking: Reported on 06/15/2015 08/22/14   [name], MD  ondansetron (ZOFRAN) 4 MG tablet Take 1 tablet (4 mg total) by mouth every 6 (six) hours. Patient not taking: Reported on 06/15/2015 08/03/14   [name], MD   BP 131/78 mmHg  Pulse 84  Temp(Src) 98.1 F (36.7 C) (Oral)  Resp 16  Ht 5\' 5"  (1.651 m)  SpO2 98% Physical Exam  Constitutional: She appears well-developed and well-nourished. No distress.  HENT:  Head: Normocephalic and atraumatic.  Eyes: Conjunctivae are normal. Pupils are equal, round, and reactive to light.  Cardiovascular: Normal rate, regular rhythm and normal heart sounds.   Pulmonary/Chest: Effort normal and breath sounds normal. No respiratory distress.  Abdominal: Soft. Normal appearance. Bowel sounds are decreased.  Exquisite tenderness over epigastric area. General tenderness across the rest of abdomen. No CVA tenderness. No abdominal masses appreciated. Abdominal aorta <3 cm. No Morene Antu or Cullen's sign noted.   Musculoskeletal: She exhibits no edema or tenderness.  Neurological: She is alert.  Skin: Skin is warm and dry. She is not diaphoretic.  Nursing note and vitals reviewed.   ED Course  Procedures (including critical care time) Labs Review Labs Reviewed  CBC WITH DIFFERENTIAL/PLATELET - Abnormal; Notable for the following:    RBC 5.40 (*)    Hemoglobin 15.7 (*)    All other components within normal limits  COMPREHENSIVE METABOLIC PANEL  - Abnormal; Notable for the following:    Chloride 98 (*)    Glucose, Bld 144 (*)    BUN 5 (*)    All other components within normal limits  URINALYSIS, ROUTINE W REFLEX MICROSCOPIC (NOT AT [facility]) - Abnormal; Notable for the following:    APPearance TURBID (*)    Glucose, UA >1000 (*)    Hgb urine dipstick SMALL (*)    Ketones, ur 40 (*)    All other components within normal limits  URINE MICROSCOPIC-ADD ON - Abnormal; Notable for the following:    Squamous Epithelial / LPF MANY (*)    Bacteria, UA MANY (*)    All other components within normal limits  LIPASE, BLOOD  TROPONIN I  AMYLASE    Imaging Review Ct Abdomen Pelvis W Contrast  06/15/2015  CLINICAL DATA:  Acute epigastric abdominal pain with nausea and vomiting. EXAM: CT ABDOMEN AND PELVIS WITH CONTRAST TECHNIQUE: Multidetector CT imaging of the abdomen and pelvis was  performed using the standard protocol following bolus administration of intravenous contrast. CONTRAST:  152mL OMNIPAQUE IOHEXOL 300 MG/ML  SOLN COMPARISON:  CT scan of June 10, 2015 and November 24, 2011. FINDINGS: Status post surgical stabilization of L4-5 and L5-S1 disc spaces. Visualized lung bases are unremarkable. Status post cholecystectomy. The liver, spleen and pancreas appear normal. Stable 1.4 cm left adrenal nodule is noted consistent with benign adenoma. Right adrenal gland and both kidneys appear normal. The appendix appears normal. There is no evidence of bowel obstruction. No abnormal fluid collection is noted. Urinary bladder appears normal. Uterus and ovaries are unremarkable. No significant adenopathy is noted. Small fat containing periumbilical hernia is noted. Chronic atrophy of left abdominal rectus muscle is noted. IMPRESSION: Stable left adrenal adenoma. Small fat containing periumbilical hernia. No acute abnormality seen in the abdomen or pelvis. Electronically Signed   By: Marijo Conception, M.D.   On: 06/15/2015 12:01   I have personally reviewed  and evaluated these images and lab results as part of my medical decision-making.   EKG Interpretation   Date/Time:  Thursday June 15 2015 09:18:00 EDT Ventricular Rate:  86 PR Interval:  197 QRS Duration: 91 QT Interval:  394 QTC Calculation: 471 R Axis:   19 Text Interpretation:  Sinus rhythm Low voltage, precordial leads  Borderline repolarization abnormality Baseline wander in lead(s) I III aVL  V3 V4 V5 Artifact No significant change was found Confirmed by [place] [phone]) on 06/15/2015 9:37:07 AM      MDM   Final diagnoses:  Epigastric abdominal pain  History of pancreatitis    [name] presents with epigastric abdominal pain for the past week accompanied by nausea and vomiting.  Findings and plan of care discussed with Ezequiel Essex, MD.  Suspect acute pancreatitis. Pt had a contrast abdominal CT on 10/29 which revealed no acute abnormalities. Will recheck labs and depending on the findings, may rescan her abdomen. Pain control, zofran, and IV fluids ordered.  CBC, CMP and lipase without significant abnormalities.  10:16 AM Pt states her pain is a little better, but almost the same. Will order a repeat CT abdomen.  11:22 AM Pt in CT. Goal for pt is pain control, as long as CT is normal.  12:16 PM Pt now rates her pain at 7/10. CT shows no acute findings. Pt updated on CT results. Pt to have pain management, nausea management, and discharge with instructions to follow up with PCP.  1:39 PM Pt reports that her pain is manageable now and would like to go home. Pt advised to keep her appt with her PCP coming up next week. Pt agreed to all aspects of the discharge plan.  Lorayne Bender, PA-C 06/15/15 [place], MD 06/15/15 [phone]

## 2015-06-18 ENCOUNTER — Encounter (HOSPITAL_COMMUNITY): Payer: Self-pay | Admitting: Emergency Medicine

## 2015-06-18 ENCOUNTER — Emergency Department (HOSPITAL_COMMUNITY)
Admission: EM | Admit: 2015-06-18 | Discharge: 2015-06-18 | Disposition: A | Discharge status: Home / Self Care | Payer: Medicare Other | Attending: Emergency Medicine | Admitting: Emergency Medicine

## 2015-06-18 DIAGNOSIS — E119 Type 2 diabetes mellitus without complications: Secondary | ICD-10-CM | POA: Insufficient documentation

## 2015-06-18 DIAGNOSIS — Z720 Tobacco use: Secondary | ICD-10-CM | POA: Diagnosis not present

## 2015-06-18 DIAGNOSIS — Z90710 Acquired absence of both cervix and uterus: Secondary | ICD-10-CM | POA: Diagnosis not present

## 2015-06-18 DIAGNOSIS — G8929 Other chronic pain: Secondary | ICD-10-CM | POA: Diagnosis not present

## 2015-06-18 DIAGNOSIS — Z8719 Personal history of other diseases of the digestive system: Secondary | ICD-10-CM | POA: Diagnosis not present

## 2015-06-18 DIAGNOSIS — Z79899 Other long term (current) drug therapy: Secondary | ICD-10-CM | POA: Diagnosis not present

## 2015-06-18 DIAGNOSIS — R112 Nausea with vomiting, unspecified: Secondary | ICD-10-CM | POA: Insufficient documentation

## 2015-06-18 DIAGNOSIS — I10 Essential (primary) hypertension: Secondary | ICD-10-CM | POA: Insufficient documentation

## 2015-06-18 DIAGNOSIS — R1033 Periumbilical pain: Secondary | ICD-10-CM | POA: Diagnosis not present

## 2015-06-18 DIAGNOSIS — R1011 Right upper quadrant pain: Secondary | ICD-10-CM | POA: Insufficient documentation

## 2015-06-18 DIAGNOSIS — R1010 Upper abdominal pain, unspecified: Secondary | ICD-10-CM

## 2015-06-18 DIAGNOSIS — R109 Unspecified abdominal pain: Secondary | ICD-10-CM | POA: Diagnosis present

## 2015-06-18 LAB — CBC WITH DIFFERENTIAL/PLATELET
Basophils Absolute: 0 10*3/uL (ref 0.0–0.1)
Basophils Relative: 0 %
EOS ABS: 0 10*3/uL (ref 0.0–0.7)
EOS PCT: 0 %
HCT: 45.1 % (ref 36.0–46.0)
Hemoglobin: 15.6 g/dL — ABNORMAL HIGH (ref 12.0–15.0)
Lymphocytes Relative: 25 %
Lymphs Abs: 2.5 10*3/uL (ref 0.7–4.0)
MCH: 28.7 pg (ref 26.0–34.0)
MCHC: 34.6 g/dL (ref 30.0–36.0)
MCV: 83.1 fL (ref 78.0–100.0)
MONO ABS: 0.7 10*3/uL (ref 0.1–1.0)
MONOS PCT: 7 %
NEUTROS PCT: 68 %
Neutro Abs: 6.6 10*3/uL (ref 1.7–7.7)
PLATELETS: 326 10*3/uL (ref 150–400)
RBC: 5.43 MIL/uL — ABNORMAL HIGH (ref 3.87–5.11)
RDW: 13.7 % (ref 11.5–15.5)
WBC: 9.8 10*3/uL (ref 4.0–10.5)

## 2015-06-18 LAB — COMPREHENSIVE METABOLIC PANEL
ALBUMIN: 4.2 g/dL (ref 3.5–5.0)
ALT: 17 U/L (ref 14–54)
ANION GAP: 9 (ref 5–15)
AST: 20 U/L (ref 15–41)
Alkaline Phosphatase: 89 U/L (ref 38–126)
BUN: 8 mg/dL (ref 6–20)
CO2: 27 mmol/L (ref 22–32)
Calcium: 9.4 mg/dL (ref 8.9–10.3)
Chloride: 99 mmol/L — ABNORMAL LOW (ref 101–111)
Creatinine, Ser: 0.65 mg/dL (ref 0.44–1.00)
GFR calc non Af Amer: >60 mL/min (ref >60)
GLUCOSE: 162 mg/dL — AB (ref 65–99)
POTASSIUM: 4.1 mmol/L (ref 3.5–5.1)
SODIUM: 135 mmol/L (ref 135–145)
Total Bilirubin: 1 mg/dL (ref 0.3–1.2)
Total Protein: 7.5 g/dL (ref 6.5–8.1)

## 2015-06-18 LAB — LIPASE, BLOOD: Lipase: 33 U/L (ref 11–51)

## 2015-06-18 MED ORDER — ONDANSETRON HCL 4 MG/2ML IJ SOLN
4.0000 mg | Freq: Once | INTRAMUSCULAR | Status: AC
  Administered 2015-06-18: 4 mg via INTRAVENOUS
  Filled 2015-06-18: qty 2

## 2015-06-18 MED ORDER — HYDROMORPHONE HCL 1 MG/ML IJ SOLN
0.5000 mg | Freq: Once | INTRAMUSCULAR | Status: AC
  Administered 2015-06-18: 0.5 mg via INTRAVENOUS
  Filled 2015-06-18: qty 1

## 2015-06-18 MED ORDER — ONDANSETRON 4 MG PO TBDP: 4.0000 mg | ORAL_TABLET | Freq: Three times a day (TID) | ORAL | Status: DC | PRN

## 2015-06-18 MED ORDER — SODIUM CHLORIDE 0.9 % IV BOLUS (SEPSIS)
1000.0000 mL | Freq: Once | INTRAVENOUS | Status: AC
  Administered 2015-06-18: 1000 mL via INTRAVENOUS

## 2015-06-18 MED ORDER — HYDROMORPHONE HCL 1 MG/ML IJ SOLN
1.0000 mg | Freq: Once | INTRAMUSCULAR | Status: AC
  Administered 2015-06-18: 1 mg via INTRAVENOUS
  Filled 2015-06-18: qty 1

## 2015-06-18 MED ORDER — OXYCODONE-ACETAMINOPHEN 5-325 MG PO TABS: 1.0000 | ORAL_TABLET | Freq: Four times a day (QID) | ORAL | Status: DC | PRN

## 2015-06-18 NOTE — Discharge Instructions (Signed)
Please read and follow all provided instructions.  Your diagnoses today include:  1. Pain of upper abdomen     Tests performed today include:  Blood counts and electrolytes  Blood tests to check liver and kidney function  Blood tests to check pancreas function  Vital signs. See below for your results today.   Medications prescribed:   Percocet (oxycodone/acetaminophen) - narcotic pain medication  DO NOT drive or perform any activities that require you to be awake and alert because this medicine can make you drowsy. BE VERY CAREFUL not to take multiple medicines containing Tylenol (also called acetaminophen). Doing so can lead to an overdose which can damage your liver and cause liver failure and possibly death.   Zofran (ondansetron) - for nausea and vomiting  Take any prescribed medications only as directed.  Home care instructions:   Follow any educational materials contained in this packet.  Follow-up instructions: Please follow-up with your primary care provider in the next 3 days for further evaluation of your symptoms. Call and make an appointment with the gastroenterologist (stomach doctor) as soon as possible for evaluation of pancreatitis.  Return instructions:  SEEK IMMEDIATE MEDICAL ATTENTION IF:  The pain does not go away or becomes severe   A temperature above 101F develops   Repeated vomiting occurs (multiple episodes)   The pain becomes localized to portions of the abdomen. The right side could possibly be appendicitis. In an adult, the left lower portion of the abdomen could be colitis or diverticulitis.   Blood is being passed in stools or vomit (bright red or black tarry stools)   You develop chest pain, difficulty breathing, dizziness or fainting, or become confused, poorly responsive, or inconsolable (young children)  If you have any other emergent concerns regarding your health  Additional Information: Abdominal (belly) pain can be caused by  many things. Your caregiver performed an examination and possibly ordered blood/urine tests and imaging (CT scan, x-rays, ultrasound). Many cases can be observed and treated at home after initial evaluation in the emergency department. Even though you are being discharged home, abdominal pain can be unpredictable. Therefore, you need a repeated exam if your pain does not resolve, returns, or worsens. Most patients with abdominal pain don't have to be admitted to the hospital or have surgery, but serious problems like appendicitis and gallbladder attacks can start out as nonspecific pain. Many abdominal conditions cannot be diagnosed in one visit, so follow-up evaluations are very important.  Your vital signs today were: BP 131/75 mmHg   Pulse 85   Temp(Src) 98.9 F (37.2 C) (Oral)   Resp 16   Ht 5\' 5"  (1.651 m)   Wt 194 lb 4 oz (88.111 kg)   BMI 32.32 kg/m2   SpO2 98% If your blood pressure (bp) was elevated above 135/85 this visit, please have this repeated by your doctor within one month. --------------

## 2015-06-18 NOTE — ED Notes (Signed)
Pt. Stated, it keeps reoccurring, pancreatitis. Stomach pain for over a week.

## 2015-06-18 NOTE — ED Provider Notes (Signed)
CSN: [id number]     Arrival date & time 06/18/15  0919 History   First MD Initiated Contact with Patient 06/18/15 1011     Chief Complaint  Patient presents with  . Pancreatitis     (Consider location/radiation/quality/duration/timing/severity/associated sxs/prior Treatment) HPI Comments: Patient with history of pancreatitis -- presents with complaint of continued abdominal pain which is been ongoing for the past 1-2 weeks. Patient has been seen in emergency department for the same place over this period. She was has received 2 CT scans of her abdomen, the first showing possible mild pancreatitis, the second which was normal. Patient states that her pain was worse last night between 8 PM and 6 AM and she had continuous vomiting. She tried taking Phenergan and Percocet, both which did not help. She was unable to tolerate any thing by mouth. No fevers, diarrhea. No urinary symptoms. The onset of this condition was acute. The course is constant. Aggravating factors: none. Alleviating factors: none.    The history is provided by the patient and medical records.    Past Medical History  Diagnosis Date  . Diabetes mellitus   . Hypertension   . Chronic back pain   . Pancreatitis   . Fibromyalgia    Past Surgical History  Procedure Laterality Date  . Abdominal hysterectomy    . Cesarean section    . Frontal fusion    . Carpal tunnel release    . Bone graft hip iliac crest    . Neck fusion      patient reported  . Back surgery     No family history on file. Social History  Substance Use Topics  . Smoking status: Current Every Day Smoker    Types: Cigarettes  . Smokeless tobacco: None  . Alcohol Use: No   OB History    No data available     Review of Systems  Constitutional: Negative for fever.  HENT: Negative for rhinorrhea and sore throat.   Eyes: Negative for redness.  Respiratory: Negative for cough.   Cardiovascular: Negative for chest pain.  Gastrointestinal: Positive  for nausea, vomiting and abdominal pain. Negative for diarrhea.  Genitourinary: Negative for dysuria.  Musculoskeletal: Negative for myalgias.  Skin: Negative for rash.  Neurological: Negative for headaches.      Allergies  Review of patient's allergies indicates no known allergies.  Home Medications   Prior to Admission medications   Medication Sig Start Date End Date Taking? Authorizing Provider  albuterol (PROVENTIL HFA;VENTOLIN HFA) 108 (90 BASE) MCG/ACT inhaler Inhale 2 puffs into the lungs every 4 (four) hours as needed for wheezing.    Historical Provider, MD  ALPRAZolam Duanne Moron) 0.5 MG tablet Take 0.5 mg by mouth 3 (three) times daily as needed for anxiety.    Historical Provider, MD  amoxicillin (AMOXIL) 500 MG capsule Take 1 capsule (500 mg total) by mouth 3 (three) times daily. Patient not taking: Reported on 06/15/2015 01/28/15   [name], MD  AZOR 5-40 MG per tablet Take 1 tablet by mouth daily.  02/06/14   Historical Provider, MD  cyclobenzaprine (FLEXERIL) 10 MG tablet Take 10 mg by mouth at bedtime as needed for muscle spasms.  07/19/14   Historical Provider, MD  HYDROcodone-acetaminophen (NORCO) 5-325 MG per tablet Take 1 tablet by mouth every 4 (four) hours as needed. Patient not taking: Reported on 06/15/2015 12/11/14   [name], PA-C  INVOKANA 100 MG TABS tablet Take 100 mg by mouth daily. 07/22/14   Historical  Provider, MD  ipratropium (ATROVENT) 0.06 % nasal spray Place 2 sprays into both nostrils 4 (four) times daily. Patient not taking: Reported on 06/15/2015 01/28/15   [name], MD  lidocaine (LIDODERM) 5 % Place 1 patch onto the skin daily. Remove & Discard patch within 12 hours or as directed by MD Patient not taking: Reported on 06/15/2015 12/11/14   [name], PA-C  meloxicam (MOBIC) 15 MG tablet Take 1 tablet (15 mg total) by mouth daily. Patient not taking: Reported on 06/15/2015 08/22/14   [name], MD  metFORMIN (GLUCOPHAGE) 500 MG tablet  Take 500 mg by mouth 2 (two) times daily with a meal.    Historical Provider, MD  metoprolol succinate (TOPROL-XL) 50 MG 24 hr tablet Take 50 mg by mouth daily. 07/21/14   Historical Provider, MD  ondansetron (ZOFRAN) 4 MG tablet Take 1 tablet (4 mg total) by mouth every 6 (six) hours. Patient not taking: Reported on 06/15/2015 08/03/14   [name], MD  oxyCODONE-acetaminophen (PERCOCET) 5-325 MG tablet Take 1-2 tablets by mouth every 6 (six) hours as needed. 06/10/15   [name], MD  phentermine (ADIPEX-P) 37.5 MG tablet Take 37.5 mg by mouth daily. 07/17/14   Historical Provider, MD  simvastatin (ZOCOR) 20 MG tablet Take 20 mg by mouth at bedtime.      Historical Provider, MD   BP 131/82 mmHg  Pulse 88  Temp(Src) 98.9 F (37.2 C) (Oral)  Resp 16  Ht 5\' 5"  (1.651 m)  Wt 194 lb 4 oz (88.111 kg)  BMI 32.32 kg/m2  SpO2 96%   Physical Exam  Constitutional: She appears well-developed and well-nourished.  HENT:  Head: Normocephalic and atraumatic.  Eyes: Conjunctivae are normal. Right eye exhibits no discharge. Left eye exhibits no discharge.  Neck: Normal range of motion. Neck supple.  Cardiovascular: Normal rate, regular rhythm and normal heart sounds.   Pulmonary/Chest: Effort normal and breath sounds normal. No respiratory distress. She has no wheezes. She has no rales.  Abdominal: Soft. Bowel sounds are normal. There is tenderness in the right upper quadrant, epigastric area and periumbilical area. There is guarding (voluntary). There is no rebound and no CVA tenderness.    Neurological: She is alert.  Skin: Skin is warm and dry.  Psychiatric: She has a normal mood and affect.  Nursing note and vitals reviewed.   ED Course  Procedures (including critical care time) Labs Review Labs Reviewed  CBC WITH DIFFERENTIAL/PLATELET - Abnormal; Notable for the following:    RBC 5.43 (*)    Hemoglobin 15.6 (*)    All other components within normal limits  COMPREHENSIVE METABOLIC  PANEL - Abnormal; Notable for the following:    Chloride 99 (*)    Glucose, Bld 162 (*)    All other components within normal limits  LIPASE, BLOOD  URINALYSIS, ROUTINE W REFLEX MICROSCOPIC (NOT AT [facility])    Imaging Review No results found. I have personally reviewed and evaluated these images and lab results as part of my medical decision-making.   EKG Interpretation None       10:33 AM Patient seen and examined. Work-up initiated. Medications ordered.   Vital signs reviewed and are as follows: BP 131/82 mmHg  Pulse 88  Temp(Src) 98.9 F (37.2 C) (Oral)  Resp 16  Ht 5\' 5"  (1.651 m)  Wt 194 lb 4 oz (88.111 kg)  BMI 32.32 kg/m2  SpO2 96%  2:47 PM patient monitored for several hours in emergency department. She has  had no further vomiting. Pain is relatively controlled. Daughter now bedside.  We discussed the patient's recent visits. It is unclear if her current symptoms are being caused by pancreatitis or another etiology. Recommend GI follow-up for further evaluation. Referral to [facility] given.   Will give 1 additional IV dose of pain and nausea medication prior to discharge to help control symptoms throughout the evening. #8 Percocet and Zofran given for home.  Patient encouraged to follow-up, return to the emergency department with worsening of current symptoms, worsening abdominal pain, persistent vomiting, blood noted in stools, fever, or any other concerns. The patient verbalized understanding.    MDM   Final diagnoses:  Pain of upper abdomen   Patient with history of pancreatitis, upper abdominal pain for 1-2 weeks. Patient has had CT scan 2, labs. Today lipase is normal and remainder of lab work is unremarkable. She is likely mildly dehydrated. Patient treated with fluids, pain medicine, nausea medicine with good results. At time of discharge, no indications for admission. Her vomiting is controlled, her pain is controlled. Follow-up and return instructions as  above.   Carlisle Cater, PA-C 06/18/15 [place], MD 06/18/15 [phone]

## 2015-06-22 ENCOUNTER — Emergency Department (HOSPITAL_COMMUNITY)
Admission: EM | Admit: 2015-06-22 | Discharge: 2015-06-22 | Disposition: A | Discharge status: Home / Self Care | Payer: Medicare Other | Attending: Physician Assistant | Admitting: Physician Assistant

## 2015-06-22 ENCOUNTER — Emergency Department (HOSPITAL_COMMUNITY): Payer: Medicare Other

## 2015-06-22 ENCOUNTER — Encounter (HOSPITAL_COMMUNITY): Payer: Self-pay | Admitting: *Deleted

## 2015-06-22 DIAGNOSIS — R112 Nausea with vomiting, unspecified: Secondary | ICD-10-CM | POA: Diagnosis not present

## 2015-06-22 DIAGNOSIS — Z791 Long term (current) use of non-steroidal anti-inflammatories (NSAID): Secondary | ICD-10-CM | POA: Diagnosis not present

## 2015-06-22 DIAGNOSIS — I10 Essential (primary) hypertension: Secondary | ICD-10-CM | POA: Insufficient documentation

## 2015-06-22 DIAGNOSIS — Z7984 Long term (current) use of oral hypoglycemic drugs: Secondary | ICD-10-CM | POA: Insufficient documentation

## 2015-06-22 DIAGNOSIS — E119 Type 2 diabetes mellitus without complications: Secondary | ICD-10-CM | POA: Diagnosis not present

## 2015-06-22 DIAGNOSIS — R109 Unspecified abdominal pain: Secondary | ICD-10-CM

## 2015-06-22 DIAGNOSIS — Z8719 Personal history of other diseases of the digestive system: Secondary | ICD-10-CM | POA: Diagnosis not present

## 2015-06-22 DIAGNOSIS — Z79899 Other long term (current) drug therapy: Secondary | ICD-10-CM | POA: Insufficient documentation

## 2015-06-22 DIAGNOSIS — M797 Fibromyalgia: Secondary | ICD-10-CM | POA: Diagnosis not present

## 2015-06-22 DIAGNOSIS — R079 Chest pain, unspecified: Secondary | ICD-10-CM | POA: Diagnosis not present

## 2015-06-22 DIAGNOSIS — Z720 Tobacco use: Secondary | ICD-10-CM | POA: Insufficient documentation

## 2015-06-22 DIAGNOSIS — G8929 Other chronic pain: Secondary | ICD-10-CM | POA: Insufficient documentation

## 2015-06-22 DIAGNOSIS — R1013 Epigastric pain: Secondary | ICD-10-CM | POA: Diagnosis not present

## 2015-06-22 LAB — COMPREHENSIVE METABOLIC PANEL
ALK PHOS: 105 U/L (ref 38–126)
ALT: 26 U/L (ref 14–54)
ANION GAP: 11 (ref 5–15)
AST: 25 U/L (ref 15–41)
Albumin: 4.2 g/dL (ref 3.5–5.0)
BILIRUBIN TOTAL: 1.2 mg/dL (ref 0.3–1.2)
BUN: 7 mg/dL (ref 6–20)
CALCIUM: 9.7 mg/dL (ref 8.9–10.3)
CO2: 27 mmol/L (ref 22–32)
Chloride: 94 mmol/L — ABNORMAL LOW (ref 101–111)
Creatinine, Ser: 0.74 mg/dL (ref 0.44–1.00)
GLUCOSE: 183 mg/dL — AB (ref 65–99)
POTASSIUM: 4.2 mmol/L (ref 3.5–5.1)
Sodium: 132 mmol/L — ABNORMAL LOW (ref 135–145)
TOTAL PROTEIN: 7.7 g/dL (ref 6.5–8.1)

## 2015-06-22 LAB — URINE MICROSCOPIC-ADD ON

## 2015-06-22 LAB — URINALYSIS, ROUTINE W REFLEX MICROSCOPIC
Bilirubin Urine: NEGATIVE
Glucose, UA: NEGATIVE mg/dL
Ketones, ur: 15 mg/dL — AB
Leukocytes, UA: NEGATIVE
NITRITE: NEGATIVE
PROTEIN: NEGATIVE mg/dL
Specific Gravity, Urine: 1.01 (ref 1.005–1.030)
UROBILINOGEN UA: 1 mg/dL (ref 0.0–1.0)
pH: 8.5 — ABNORMAL HIGH (ref 5.0–8.0)

## 2015-06-22 LAB — CBC
HCT: 48.5 % — ABNORMAL HIGH (ref 36.0–46.0)
HEMOGLOBIN: 16.7 g/dL — AB (ref 12.0–15.0)
MCH: 29 pg (ref 26.0–34.0)
MCHC: 34.4 g/dL (ref 30.0–36.0)
MCV: 84.2 fL (ref 78.0–100.0)
Platelets: 345 10*3/uL (ref 150–400)
RBC: 5.76 MIL/uL — AB (ref 3.87–5.11)
RDW: 13.7 % (ref 11.5–15.5)
WBC: 11.6 10*3/uL — AB (ref 4.0–10.5)

## 2015-06-22 LAB — LIPASE, BLOOD: Lipase: 99 U/L — ABNORMAL HIGH (ref 11–51)

## 2015-06-22 LAB — I-STAT TROPONIN, ED: TROPONIN I, POC: 0 ng/mL (ref 0.00–0.08)

## 2015-06-22 LAB — ETHANOL: Alcohol, Ethyl (B): <5 mg/dL (ref <5)

## 2015-06-22 MED ORDER — HYDROCODONE-ACETAMINOPHEN 5-325 MG PO TABS: 1.0000 | ORAL_TABLET | ORAL | Status: DC | PRN

## 2015-06-22 MED ORDER — HYDROMORPHONE HCL 1 MG/ML IJ SOLN
1.0000 mg | Freq: Once | INTRAMUSCULAR | Status: AC
  Administered 2015-06-22: 1 mg via INTRAVENOUS
  Filled 2015-06-22: qty 1

## 2015-06-22 MED ORDER — GI COCKTAIL ~~LOC~~
30.0000 mL | Freq: Once | ORAL | Status: AC
  Administered 2015-06-22: 30 mL via ORAL
  Filled 2015-06-22: qty 30

## 2015-06-22 MED ORDER — ONDANSETRON HCL 4 MG/2ML IJ SOLN
4.0000 mg | INTRAMUSCULAR | Status: AC
  Administered 2015-06-22: 4 mg via INTRAVENOUS
  Filled 2015-06-22: qty 2

## 2015-06-22 MED ORDER — PROMETHAZINE HCL 25 MG PO TABS: 25.0000 mg | ORAL_TABLET | Freq: Four times a day (QID) | ORAL | Status: DC | PRN

## 2015-06-22 MED ORDER — SODIUM CHLORIDE 0.9 % IV BOLUS (SEPSIS)
1000.0000 mL | Freq: Once | INTRAVENOUS | Status: AC
  Administered 2015-06-22: 1000 mL via INTRAVENOUS

## 2015-06-22 MED ORDER — ONDANSETRON 4 MG PO TBDP: 4.0000 mg | ORAL_TABLET | Freq: Three times a day (TID) | ORAL | Status: DC | PRN

## 2015-06-22 NOTE — ED Notes (Signed)
Pt transported to radiology.

## 2015-06-22 NOTE — ED Provider Notes (Signed)
CSN: [id number]     Arrival date & time 06/22/15  1026 History   First MD Initiated Contact with Patient 06/22/15 1037     Chief Complaint  Patient presents with  . Abdominal Pain    HPI   [name] is a 49 y.o. female with a PMH of DM, HTN, pancreatitis who presents to the ED with persistent epigastric pain, which she states has been present for the past several weeks. She reports her pain is constant. She denies exacerbating factors. She states dilaudid and zofran are the only medications that lead to symptom relief. She reports associated nausea and vomiting, and states she had 8 episodes of nonbloody emesis today. She denies diarrhea, constipation, dysuria, urgency, frequency. She denies fever, chills. She reports chest tightness. She denies shortness of breath.   Past Medical History  Diagnosis Date  . Diabetes mellitus   . Hypertension   . Chronic back pain   . Pancreatitis   . Fibromyalgia    Past Surgical History  Procedure Laterality Date  . Abdominal hysterectomy    . Cesarean section    . Frontal fusion    . Carpal tunnel release    . Bone graft hip iliac crest    . Neck fusion      patient reported  . Back surgery     No family history on file. Social History  Substance Use Topics  . Smoking status: Current Every Day Smoker    Types: Cigarettes  . Smokeless tobacco: None  . Alcohol Use: No   OB History    No data available      Review of Systems  Constitutional: Negative for fever and chills.  Respiratory: Negative for shortness of breath.   Cardiovascular: Positive for chest pain.  Gastrointestinal: Positive for nausea, vomiting and abdominal pain. Negative for diarrhea, constipation and blood in stool.  Genitourinary: Negative for dysuria, urgency and frequency.  All other systems reviewed and are negative.     Allergies  Review of patient's allergies indicates no known allergies.  Home Medications   Prior to Admission medications    Medication Sig Start Date End Date Taking? Authorizing Provider  ALPRAZolam Duanne Moron) 0.5 MG tablet Take 0.5 mg by mouth 3 (three) times daily as needed for anxiety.   Yes Historical Provider, MD  oxyCODONE-acetaminophen (PERCOCET/ROXICET) 5-325 MG tablet Take 1-2 tablets by mouth every 6 (six) hours as needed for severe pain. 06/18/15  Yes Carlisle Cater, PA-C  albuterol (PROVENTIL HFA;VENTOLIN HFA) 108 (90 BASE) MCG/ACT inhaler Inhale 2 puffs into the lungs every 4 (four) hours as needed for wheezing.    Historical Provider, MD  AZOR 5-40 MG per tablet Take 1 tablet by mouth daily.  02/06/14   Historical Provider, MD  cyclobenzaprine (FLEXERIL) 10 MG tablet Take 10 mg by mouth at bedtime as needed for muscle spasms.  07/19/14   Historical Provider, MD  gabapentin (NEURONTIN) 300 MG capsule Take 300 mg by mouth 3 (three) times daily.    Historical Provider, MD  HYDROcodone-acetaminophen (NORCO/VICODIN) 5-325 MG tablet Take 1 tablet by mouth every 4 (four) hours as needed. 06/22/15   Avanthika Dehnert C Tranesha Lessner, PA-C  INVOKANA 100 MG TABS tablet Take 100 mg by mouth daily. 07/22/14   Historical Provider, MD  meloxicam (MOBIC) 15 MG tablet Take 1 tablet (15 mg total) by mouth daily. 08/22/14   [name], MD  metFORMIN (GLUCOPHAGE) 500 MG tablet Take 500 mg by mouth 2 (two) times daily with a  meal.    Historical Provider, MD  metoprolol succinate (TOPROL-XL) 50 MG 24 hr tablet Take 50 mg by mouth daily. 07/21/14   Historical Provider, MD  omeprazole (PRILOSEC) 20 MG capsule Take 20 mg by mouth daily.    Historical Provider, MD  ondansetron (ZOFRAN ODT) 4 MG disintegrating tablet Take 1 tablet (4 mg total) by mouth every 8 (eight) hours as needed for nausea. 06/22/15   Marella Chimes, PA-C  promethazine (PHENERGAN) 25 MG tablet Take 25 mg by mouth every 6 (six) hours as needed for nausea or vomiting.    Historical Provider, MD  simvastatin (ZOCOR) 20 MG tablet Take 20 mg by mouth at bedtime.       Historical Provider, MD    BP 139/86 mmHg  Pulse 68  Temp(Src) 98.5 F (36.9 C) (Oral)  Resp 19  SpO2 95% Physical Exam  Constitutional: She is oriented to person, place, and time. She appears well-developed and well-nourished. She appears distressed.  Patient appears uncomfortable due to pain.  HENT:  Head: Normocephalic and atraumatic.  Right Ear: External ear normal.  Left Ear: External ear normal.  Nose: Nose normal.  Mouth/Throat: Uvula is midline, oropharynx is clear and moist and mucous membranes are normal.  Eyes: Conjunctivae, EOM and lids are normal. Pupils are equal, round, and reactive to light. Right eye exhibits no discharge. Left eye exhibits no discharge. No scleral icterus.  Neck: Normal range of motion. Neck supple.  Cardiovascular: Normal rate, regular rhythm, normal heart sounds, intact distal pulses and normal pulses.   Pulmonary/Chest: Effort normal and breath sounds normal. No respiratory distress. She has no wheezes. She has no rales.  Abdominal: Soft. Normal appearance and bowel sounds are normal. She exhibits no distension and no mass. There is tenderness. There is no rigidity, no rebound and no guarding.  Tenderness to palpation in epigastrium. No rebound, guarding, or masses.  Musculoskeletal: Normal range of motion. She exhibits no edema or tenderness.  Neurological: She is alert and oriented to person, place, and time. She has normal strength. No sensory deficit.  Skin: Skin is warm, dry and intact. No rash noted. She is not diaphoretic. No erythema. No pallor.  Psychiatric: She has a normal mood and affect. Her speech is normal and behavior is normal.  Nursing note and vitals reviewed.   ED Course  Procedures (including critical care time)  Labs Review Labs Reviewed  LIPASE, BLOOD - Abnormal; Notable for the following:    Lipase 99 (*)    All other components within normal limits  COMPREHENSIVE METABOLIC PANEL - Abnormal; Notable for the  following:    Sodium 132 (*)    Chloride 94 (*)    Glucose, Bld 183 (*)    All other components within normal limits  CBC - Abnormal; Notable for the following:    WBC 11.6 (*)    RBC 5.76 (*)    Hemoglobin 16.7 (*)    HCT 48.5 (*)    All other components within normal limits  URINALYSIS, ROUTINE W REFLEX MICROSCOPIC (NOT AT [facility]) - Abnormal; Notable for the following:    pH 8.5 (*)    Hgb urine dipstick TRACE (*)    Ketones, ur 15 (*)    All other components within normal limits  URINE MICROSCOPIC-ADD ON - Abnormal; Notable for the following:    Squamous Epithelial / LPF FEW (*)    All other components within normal limits  ETHANOL  I-STAT TROPOININ, ED    Imaging  Review Dg Chest 2 View  06/22/2015  CLINICAL DATA:  Shortness of breath and chest pain EXAM: CHEST  2 VIEW COMPARISON:  March 11, 2014 FINDINGS: There is no edema or consolidation. Heart is upper normal size with pulmonary vascularity within limits. No adenopathy. No pneumothorax. No bone lesions. IMPRESSION: No edema or consolidation. Electronically Signed   By: Lowella Grip III M.D.   On: 06/22/2015 12:52     I have personally reviewed and evaluated these images and lab results as part of my medical decision-making.   EKG Interpretation   Date/Time:  Thursday June 22 2015 11:55:29 EST Ventricular Rate:  74 PR Interval:  199 QRS Duration: 93 QT Interval:  478 QTC Calculation: 530 R Axis:   20 Text Interpretation:  Sinus rhythm Low voltage, precordial leads  Nonspecific T abnrm, anterolateral leads Prolonged QT interval no acute  ischemia No significant change since last tracing Confirmed by Gerald Leitz ([id number]) on 06/22/2015 12:03:31 PM      MDM   Final diagnoses:  Abdominal pain, unspecified abdominal location  Non-intractable vomiting with nausea, vomiting of unspecified type    49 year old female presents with persistent epigastric pain x several weeks. Patient has recently been  evaluated in the ED several times for abdominal pain, and has a history of pancreatitis. CT abdomen pelvis 11/03 was unremarkable for acute intraabdominal pathology. Reports associated nausea and 8 episodes of emesis today. Denies diarrhea, constipation, dysuria, urgency, frequency, fever, chills. Reports chest tightness. Denies shortness of breath.  Patient is afebrile. Mildly tachypnea. Heart regular rhythm. Lungs clear to auscultation bilaterally. Abdomen soft, nondistended, with mild tenderness to palpation epigastrium. No rebound, guarding, or masses. No lower extremity edema.  Labs as above pending. Will give fluids, antiemetic, pain medication.  EKG sinus rhythm, no significant change from prior. Troponin negative. CXR no edema or consolidation.  CBC remarkable for mild leukocytosis of 11.6. CMP unremarkable. Lipase elevated at 99. UA negative for infection.  With pain improvement and PO trial, patient likely to be discharged with PCP follow-up.  Patient reports significant symptom improvement and is able to tolerate PO intake. Feel she is stable for discharge at this time. Patient to follow-up with PCP and with GI for further evaluation and management. Return precautions discussed at length. Patient verbalizes her understanding and is in agreement with plan.  BP 139/86 mmHg  Pulse 68  Temp(Src) 98.5 F (36.9 C) (Oral)  Resp 19  SpO2 95%     Marella Chimes, PA-C 06/22/15 2212  [place], MD 06/24/15 [phone]

## 2015-06-22 NOTE — Discharge Instructions (Signed)
1. Medications: zofran (for nausea), vicodin (for pain), usual home medications 2. Treatment: rest, drink plenty of fluids 3. Follow Up: please followup with your PCP and with GI this week for discussion of your diagnoses and further evaluation after today's visit; please return to the ER for high fever, severe pain, persistent vomiting, new or worsening symptoms

## 2015-06-22 NOTE — ED Notes (Signed)
Pt has abdominal pain and n/v x7 today. Pt reports hx of pancreatitis. Pt has been seen multiple times for same.

## 2015-06-26 ENCOUNTER — Encounter (HOSPITAL_COMMUNITY): Payer: Self-pay | Admitting: Emergency Medicine

## 2015-06-26 ENCOUNTER — Emergency Department (HOSPITAL_COMMUNITY)
Admission: EM | Admit: 2015-06-26 | Discharge: 2015-06-26 | Disposition: A | Discharge status: Home / Self Care | Payer: Medicare Other | Attending: Emergency Medicine | Admitting: Emergency Medicine

## 2015-06-26 DIAGNOSIS — G8929 Other chronic pain: Secondary | ICD-10-CM | POA: Insufficient documentation

## 2015-06-26 DIAGNOSIS — I10 Essential (primary) hypertension: Secondary | ICD-10-CM | POA: Diagnosis not present

## 2015-06-26 DIAGNOSIS — E119 Type 2 diabetes mellitus without complications: Secondary | ICD-10-CM | POA: Diagnosis not present

## 2015-06-26 DIAGNOSIS — F17210 Nicotine dependence, cigarettes, uncomplicated: Secondary | ICD-10-CM | POA: Insufficient documentation

## 2015-06-26 DIAGNOSIS — Z79899 Other long term (current) drug therapy: Secondary | ICD-10-CM | POA: Diagnosis not present

## 2015-06-26 DIAGNOSIS — K861 Other chronic pancreatitis: Secondary | ICD-10-CM

## 2015-06-26 DIAGNOSIS — Z90710 Acquired absence of both cervix and uterus: Secondary | ICD-10-CM | POA: Diagnosis not present

## 2015-06-26 DIAGNOSIS — Z791 Long term (current) use of non-steroidal anti-inflammatories (NSAID): Secondary | ICD-10-CM | POA: Insufficient documentation

## 2015-06-26 DIAGNOSIS — R109 Unspecified abdominal pain: Secondary | ICD-10-CM | POA: Diagnosis present

## 2015-06-26 LAB — CBC
HCT: 47.9 % — ABNORMAL HIGH (ref 36.0–46.0)
HEMOGLOBIN: 16.4 g/dL — AB (ref 12.0–15.0)
MCH: 29.2 pg (ref 26.0–34.0)
MCHC: 34.2 g/dL (ref 30.0–36.0)
MCV: 85.4 fL (ref 78.0–100.0)
PLATELETS: 370 10*3/uL (ref 150–400)
RBC: 5.61 MIL/uL — AB (ref 3.87–5.11)
RDW: 13.8 % (ref 11.5–15.5)
WBC: 11 10*3/uL — ABNORMAL HIGH (ref 4.0–10.5)

## 2015-06-26 LAB — URINALYSIS, ROUTINE W REFLEX MICROSCOPIC
Bilirubin Urine: NEGATIVE
Glucose, UA: 250 mg/dL — AB
KETONES UR: NEGATIVE mg/dL
LEUKOCYTES UA: NEGATIVE
Nitrite: NEGATIVE
Protein, ur: NEGATIVE mg/dL
Specific Gravity, Urine: 1.023 (ref 1.005–1.030)
UROBILINOGEN UA: 0.2 mg/dL (ref 0.0–1.0)
pH: 6.5 (ref 5.0–8.0)

## 2015-06-26 LAB — LIPASE, BLOOD: Lipase: 34 U/L (ref 11–51)

## 2015-06-26 LAB — COMPREHENSIVE METABOLIC PANEL
ALK PHOS: 97 U/L (ref 38–126)
ALT: 24 U/L (ref 14–54)
ANION GAP: 9 (ref 5–15)
AST: 18 U/L (ref 15–41)
Albumin: 4.5 g/dL (ref 3.5–5.0)
BILIRUBIN TOTAL: 0.9 mg/dL (ref 0.3–1.2)
BUN: 10 mg/dL (ref 6–20)
CALCIUM: 9.4 mg/dL (ref 8.9–10.3)
CO2: 27 mmol/L (ref 22–32)
CREATININE: 0.65 mg/dL (ref 0.44–1.00)
Chloride: 99 mmol/L — ABNORMAL LOW (ref 101–111)
GFR calc non Af Amer: >60 mL/min (ref >60)
Glucose, Bld: 166 mg/dL — ABNORMAL HIGH (ref 65–99)
Potassium: 4.2 mmol/L (ref 3.5–5.1)
Sodium: 135 mmol/L (ref 135–145)
TOTAL PROTEIN: 7.8 g/dL (ref 6.5–8.1)

## 2015-06-26 LAB — URINE MICROSCOPIC-ADD ON

## 2015-06-26 MED ORDER — ONDANSETRON HCL 4 MG/2ML IJ SOLN
4.0000 mg | Freq: Once | INTRAMUSCULAR | Status: AC
  Administered 2015-06-26: 4 mg via INTRAVENOUS
  Filled 2015-06-26: qty 2

## 2015-06-26 MED ORDER — HYDROMORPHONE HCL 1 MG/ML IJ SOLN
1.0000 mg | Freq: Once | INTRAMUSCULAR | Status: AC
  Administered 2015-06-26: 1 mg via INTRAVENOUS
  Filled 2015-06-26: qty 1

## 2015-06-26 MED ORDER — SODIUM CHLORIDE 0.9 % IV BOLUS (SEPSIS)
1000.0000 mL | Freq: Once | INTRAVENOUS | Status: AC
  Administered 2015-06-26: 1000 mL via INTRAVENOUS

## 2015-06-26 NOTE — Discharge Instructions (Signed)
Your pancreas levels were normal today. Your pain is probably from your chronic pancreatitis. Please make sure you follow up with your regular doctor. Please take your medication as directed.  Abdominal Pain, Adult Many things can cause belly (abdominal) pain. Most times, the belly pain is not dangerous. Many cases of belly pain can be watched and treated at home. HOME CARE   Do not take medicines that help you go poop (laxatives) unless told to by your doctor.  Only take medicine as told by your doctor.  Eat or drink as told by your doctor. Your doctor will tell you if you should be on a special diet. GET HELP IF:  You do not know what is causing your belly pain.  You have belly pain while you are sick to your stomach (nauseous) or have runny poop (diarrhea).  You have pain while you pee or poop.  Your belly pain wakes you up at night.  You have belly pain that gets worse or better when you eat.  You have belly pain that gets worse when you eat fatty foods.  You have a fever. GET HELP RIGHT AWAY IF:   The pain does not go away within 2 hours.  You keep throwing up (vomiting).  The pain changes and is only in the right or left part of the belly.  You have bloody or tarry looking poop. MAKE SURE YOU:   Understand these instructions.  Will watch your condition.  Will get help right away if you are not doing well or get worse.   This information is not intended to replace advice given to you by your health care provider. Make sure you discuss any questions you have with your health care provider.   Document Released: 01/15/2008 Document Revised: 08/19/2014 Document Reviewed: 04/07/2013 Elsevier Interactive Patient Education 2016 [place] Diet for Pancreatitis or Gallbladder Conditions A low-fat diet can be helpful if you have pancreatitis or a gallbladder condition. With these conditions, your pancreas and gallbladder have trouble digesting fats. A  healthy eating plan with less fat will help rest your pancreas and gallbladder and reduce your symptoms. WHAT DO I NEED TO KNOW ABOUT THIS DIET?  Eat a low-fat diet.  Reduce your fat intake to less than 20-30% of your total daily calories. This is less than 50-60 g of fat per day.  Remember that you need some fat in your diet. Ask your dietician what your daily goal should be.  Choose nonfat and low-fat healthy foods. Look for the words "nonfat," "low fat," or "fat free."  As a guide, look on the label and choose foods with less than 3 g of fat per serving. Eat only one serving.  Avoid alcohol.  Do not smoke. If you need help quitting, talk with your health care provider.  Eat small frequent meals instead of three large heavy meals. WHAT FOODS CAN I EAT? Grains Include healthy grains and starches such as potatoes, wheat bread, fiber-rich cereal, and brown rice. Choose whole grain options whenever possible. In adults, whole grains should account for 45-65% of your daily calories.  Fruits and Vegetables Eat plenty of fruits and vegetables. Fresh fruits and vegetables add fiber to your diet. Meats and Other Protein Sources Eat lean meat such as chicken and pork. Trim any fat off of meat before cooking it. Eggs, fish, and beans are other sources of protein. In adults, these foods should account for 10-35% of your daily calories. Dairy Choose low-fat  milk and dairy options. Dairy includes fat and protein, as well as calcium.  Fats and Oils Limit high-fat foods such as fried foods, sweets, baked goods, sugary drinks.  Other Creamy sauces and condiments, such as mayonnaise, can add extra fat. Think about whether or not you need to use them, or use smaller amounts or low fat options. WHAT FOODS ARE NOT RECOMMENDED?  High fat foods, such as:  [employer].  Ice cream.  [place] toast.  Sweet rolls.  Pizza.  Cheese bread.  Foods covered with batter, butter, creamy sauces, or  cheese.  Fried foods.  Sugary drinks and desserts.  Foods that cause gas or bloating   This information is not intended to replace advice given to you by your health care provider. Make sure you discuss any questions you have with your health care provider.   Document Released: 08/03/2013 Document Reviewed: 08/03/2013 Elsevier Interactive Patient Education [employer].

## 2015-06-26 NOTE — ED Notes (Signed)
Awake. Verbally responsive. A/O x4. Resp even and unlabored. No audible adventitious breath sounds noted. ABC's intact. IV saline lock patent and intact. 

## 2015-06-26 NOTE — ED Notes (Signed)
Pt arrived via EMS from PMD's office for c/o of upper abd pain. Pt reported n/v x8 in 24 hrs but denies having diarrhea. LBM was last night. Pt was given Toradol 60mg  IM at MD's office.

## 2015-06-26 NOTE — ED Provider Notes (Signed)
CSN: [id number]     Arrival date & time 06/26/15  1307 History   First MD Initiated Contact with Patient 06/26/15 1415     Chief Complaint  Patient presents with  . Abdominal Pain     (Consider location/radiation/quality/duration/timing/severity/associated sxs/prior Treatment) HPI  Patient is a 49 year old female with history of pancreatitis, HTN, DM, and fibromyalgia who presents with abdominal pain and 8 episodes of emesis since yesterday. Patient reports that her current flare of pancreatitis started about 2 weeks ago. Of note, this is the patient's fifth visit to the ED in the past 2 weeks for the same complaint. Her pain is described as constant and burning, located in her epigastric area. Patient reports that her pain feels like her previous pancreatitis flares. Patient reports that the only thing that helps her pain and nausea is dilaudid and zofran. The patient has had two abdominal CTs over that time, the first with possible early pancreatitis, and the second normal. No fevers or chills. No diarrhea. No dysuria.   Past Medical History  Diagnosis Date  . Diabetes mellitus   . Hypertension   . Chronic back pain   . Pancreatitis   . Fibromyalgia    Past Surgical History  Procedure Laterality Date  . Abdominal hysterectomy    . Cesarean section    . Frontal fusion    . Carpal tunnel release    . Bone graft hip iliac crest    . Neck fusion      patient reported  . Back surgery     Family History  Problem Relation Age of Onset  . Diabetes Mother   . Hypertension Mother   . Edema Mother   . Hypertension Father   . Renal Disease Father   . Diabetes Sister   . Diabetes Brother    Social History  Substance Use Topics  . Smoking status: Current Every Day Smoker    Types: Cigarettes  . Smokeless tobacco: None  . Alcohol Use: No   OB History    No data available     Review of Systems  Constitutional: Negative.   HENT: Negative.   Eyes: Negative.   Respiratory:  Negative.   Cardiovascular: Negative.   Gastrointestinal: Positive for nausea, vomiting and abdominal pain.  Endocrine: Negative.   Genitourinary: Negative.   Musculoskeletal: Negative.   Skin: Negative.   Allergic/Immunologic: Negative.   Neurological: Negative.   Hematological: Negative.   Psychiatric/Behavioral: Negative.       Allergies  Review of patient's allergies indicates no known allergies.  Home Medications   Prior to Admission medications   Medication Sig Start Date End Date Taking? Authorizing Provider  albuterol (PROVENTIL HFA;VENTOLIN HFA) 108 (90 BASE) MCG/ACT inhaler Inhale 2 puffs into the lungs every 4 (four) hours as needed for wheezing.    Historical Provider, MD  ALPRAZolam Duanne Moron) 0.5 MG tablet Take 0.5 mg by mouth 3 (three) times daily as needed for anxiety.    Historical Provider, MD  AZOR 5-40 MG per tablet Take 1 tablet by mouth daily.  02/06/14   Historical Provider, MD  cyclobenzaprine (FLEXERIL) 10 MG tablet Take 10 mg by mouth at bedtime as needed for muscle spasms.  07/19/14   Historical Provider, MD  gabapentin (NEURONTIN) 300 MG capsule Take 300 mg by mouth 3 (three) times daily.    Historical Provider, MD  HYDROcodone-acetaminophen (NORCO/VICODIN) 5-325 MG tablet Take 1 tablet by mouth every 4 (four) hours as needed. 06/22/15   Guadelupe Sabin  [place], PA-C  INVOKANA 100 MG TABS tablet Take 100 mg by mouth daily. 07/22/14   Historical Provider, MD  meloxicam (MOBIC) 15 MG tablet Take 1 tablet (15 mg total) by mouth daily. 08/22/14   [name], MD  metFORMIN (GLUCOPHAGE) 500 MG tablet Take 500 mg by mouth 2 (two) times daily with a meal.    Historical Provider, MD  metoprolol succinate (TOPROL-XL) 50 MG 24 hr tablet Take 50 mg by mouth daily. 07/21/14   Historical Provider, MD  omeprazole (PRILOSEC) 20 MG capsule Take 20 mg by mouth daily.    Historical Provider, MD  oxyCODONE-acetaminophen (PERCOCET/ROXICET) 5-325 MG tablet Take 1-2 tablets by mouth  every 6 (six) hours as needed for severe pain. 06/18/15   Carlisle Cater, PA-C  promethazine (PHENERGAN) 25 MG tablet Take 1 tablet (25 mg total) by mouth every 6 (six) hours as needed for nausea or vomiting. 06/22/15   Marella Chimes, PA-C  simvastatin (ZOCOR) 20 MG tablet Take 20 mg by mouth at bedtime.      Historical Provider, MD   BP 119/76 mmHg  Pulse 79  Temp(Src) 98.3 F (36.8 C) (Oral)  Resp 16  SpO2 97% Physical Exam  Constitutional: She is oriented to person, place, and time. She appears well-developed and well-nourished. She appears ill. No distress.  HENT:  Head: Normocephalic and atraumatic.  Eyes: EOM are normal. Pupils are equal, round, and reactive to light.  Neck: Normal range of motion. Neck supple.  Cardiovascular: Regular rhythm and normal heart sounds.   Pulmonary/Chest: Effort normal and breath sounds normal.  Abdominal: Soft. Bowel sounds are normal. She exhibits no distension. There is tenderness (eipgastric). There is no rebound and no guarding.  Musculoskeletal: Normal range of motion. She exhibits no edema.  Neurological: She is alert and oriented to person, place, and time.  Skin: Skin is warm and dry.  Psychiatric: She has a normal mood and affect. Her behavior is normal.  Nursing note and vitals reviewed.   ED Course  Procedures (including critical care time) Labs Review Labs Reviewed  COMPREHENSIVE METABOLIC PANEL - Abnormal; Notable for the following:    Chloride 99 (*)    Glucose, Bld 166 (*)    All other components within normal limits  CBC - Abnormal; Notable for the following:    WBC 11.0 (*)    RBC 5.61 (*)    Hemoglobin 16.4 (*)    HCT 47.9 (*)    All other components within normal limits  URINALYSIS, ROUTINE W REFLEX MICROSCOPIC (NOT AT [facility]) - Abnormal; Notable for the following:    APPearance CLOUDY (*)    Glucose, UA 250 (*)    Hgb urine dipstick SMALL (*)    All other components within normal limits  LIPASE, BLOOD  URINE  MICROSCOPIC-ADD ON    Imaging Review No results found. I have personally reviewed and evaluated these images and lab results as part of my medical decision-making.    MDM   Final diagnoses:  Chronic pancreatitis, unspecified pancreatitis type ([place])   2:32 PM Patient is a 49 year old woman with history of chronic pancreatitis presenting with epigastric abdominal pain and nausea. Epigastric tenderness noted on exam with no peritoneal signs. Patient has been evaluated in the ED several times for this pain. Lipase within normal limits. Mild leukocytosis noted on CBC (improved from last visit). UA unremarkable. Will give fluid, pain medications, and antiemetics.   3:54 PM Pain somewhat improving after dilaudid. Nausea improved. No  further episodes of emesis. Pain likely secondary to chronic pancreatitis. Vital signs stable. Patient stable for discharge with follow up with PCP for ongoing management of the patient's chronic abdominal pain.   Vivi Barrack, MD 06/26/15 [place], MD 06/26/15 [phone]

## 2015-06-27 ENCOUNTER — Telehealth: Payer: Self-pay | Admitting: *Deleted

## 2015-08-22 ENCOUNTER — Encounter (HOSPITAL_BASED_OUTPATIENT_CLINIC_OR_DEPARTMENT_OTHER): Payer: Self-pay | Admitting: Internal Medicine

## 2015-11-16 ENCOUNTER — Ambulatory Visit (INDEPENDENT_AMBULATORY_CARE_PROVIDER_SITE_OTHER): Payer: Medicare Other

## 2015-11-16 ENCOUNTER — Encounter: Payer: Self-pay | Admitting: Podiatry

## 2015-11-16 ENCOUNTER — Ambulatory Visit (INDEPENDENT_AMBULATORY_CARE_PROVIDER_SITE_OTHER): Payer: Medicare Other | Admitting: Podiatry

## 2015-11-16 VITALS — BP 140/89 | HR 92 | Resp 12

## 2015-11-16 DIAGNOSIS — M2040 Other hammer toe(s) (acquired), unspecified foot: Secondary | ICD-10-CM

## 2015-11-16 DIAGNOSIS — E119 Type 2 diabetes mellitus without complications: Secondary | ICD-10-CM | POA: Diagnosis not present

## 2015-11-16 DIAGNOSIS — L600 Ingrowing nail: Secondary | ICD-10-CM

## 2015-11-16 MED ORDER — NEOMYCIN-POLYMYXIN-HC 3.5-10000-1 OT SOLN: OTIC | Status: DC

## 2015-11-16 NOTE — Progress Notes (Signed)
   Subjective:    Patient ID: [name], female    DOB: 1965/12/11, 50 y.o.   MRN: [id number]  HPI: She presents today with a chief complaint of a painful ingrown toenail to the tibial and fibular border of the hallux right. She states this didn't bother me for about 8 months and I just cannot deal with it any longer. She states that she has diabetes and that her blood sugars better controlled now than ever since she's lost 30 pounds. She states that her hemoglobin A1c is running in the 7.0 area.    Review of Systems  Skin: Positive for color change.       Objective:   Physical Exam: Vital signs are stable alert and oriented 3. I have reviewed her past medical history medications allergy surgeries and social history. Pulses are intact muscle strength is normal. Deep tendon reflexes are normal. Orthopedic evaluation and x-rays all joints distal to the ankle for range of motion without crepitus. Cutaneous evaluation demonstrates supple well-hydrated cutis there is no erythema edema saline as drainage or odor. Mild sharp incurvated nail margins to the tibia and fibular border with tenderness on palpation and mild erythema to the borders. Radiographs taken of the foot demonstrate only mild hammertoe deformities.        Assessment & Plan:  Ingrown nail paronychia abscess hallux right.  Plan: Discussed etiology pathology conservative versus surgical therapies. At this point we performed a chemical matrixectomy to the tibial and fibular border of the hallux right. She tolerated this well after local anesthesia was administered. She was provided with oral and then ongoing instructions for care and soaking of her toe will follow up with [place] in 1-2 weeks and a prescription for Cortisporin Otic was provided. She will notify us with questions or concerns.

## 2015-11-16 NOTE — Patient Instructions (Signed)

## 2015-11-28 ENCOUNTER — Ambulatory Visit (INDEPENDENT_AMBULATORY_CARE_PROVIDER_SITE_OTHER): Payer: Medicare Other | Admitting: Podiatry

## 2015-11-28 DIAGNOSIS — L600 Ingrowing nail: Secondary | ICD-10-CM

## 2015-11-28 NOTE — Progress Notes (Signed)
She presents today little more than a week status post matrixectomy tibial and fibular border of the hallux right. She states this seems to be doing just great and continues to soak in Betadine and warm water and applying Neosporin.  Objective: Vital signs stable alert and oriented 3. Pulses are strongly palpable. Neurologic sensorium is intact. Deep tendon reflexes are intact. No erythema edema saline as drainage or odor to the surgical site.  Assessment: Well-healing surgical toe hallux right.  Plan: I encouraged her to discontinue Betadine start with Epsom salts and warm water soaks continue to soak and to completely resolve. Because of the history of diabetes I will follow up with her in 2 weeks if necessary. I also encouraged her to discontinue Neosporin and to leave it open at bedtime.

## 2015-11-28 NOTE — Patient Instructions (Signed)

## 2015-12-08 ENCOUNTER — Other Ambulatory Visit: Payer: Self-pay

## 2015-12-08 DIAGNOSIS — Z1231 Encounter for screening mammogram for malignant neoplasm of breast: Secondary | ICD-10-CM

## 2015-12-28 ENCOUNTER — Telehealth: Payer: Self-pay | Admitting: *Deleted

## 2015-12-28 NOTE — Telephone Encounter (Signed)
Pt left phone number and birthdate.  Left message to make an appt if continuing to have problems with the toe after her procedure in April.

## 2016-01-16 ENCOUNTER — Encounter: Payer: Self-pay | Admitting: Podiatry

## 2016-01-16 ENCOUNTER — Ambulatory Visit (INDEPENDENT_AMBULATORY_CARE_PROVIDER_SITE_OTHER): Payer: Medicare Other | Admitting: Podiatry

## 2016-01-16 VITALS — BP 140/74 | HR 69 | Resp 12

## 2016-01-16 DIAGNOSIS — L600 Ingrowing nail: Secondary | ICD-10-CM

## 2016-01-17 NOTE — Progress Notes (Signed)
[name] presents today for follow-up of her matrixectomy tibial border hallux right. She states it is doing so much better she states the symptoms a little sore occasionally but is so much better.  Objective: Vital signs are stable she's alert and oriented 3 there is no erythema edema saline as drainage or odor margin appears to be healing very nicely there was area of hyperkeratotic tissue around the margin of the nail which I debrided for her there was no bleeding there is no signs of nail spicule no ingrowing of the nail.  Assessment: Well-healing surgical timeout hallux right.  Plan: Follow up with [place] as needed.

## 2016-01-22 ENCOUNTER — Ambulatory Visit: Payer: Medicare Other

## 2016-04-18 ENCOUNTER — Ambulatory Visit
Admission: RE | Admit: 2016-04-18 | Discharge: 2016-04-18 | Disposition: A | Discharge status: Home / Self Care | Payer: Medicare Other | Source: Ambulatory Visit

## 2016-04-18 ENCOUNTER — Other Ambulatory Visit: Payer: Self-pay | Admitting: Internal Medicine

## 2016-04-18 DIAGNOSIS — Z1231 Encounter for screening mammogram for malignant neoplasm of breast: Secondary | ICD-10-CM

## 2017-03-11 ENCOUNTER — Other Ambulatory Visit: Payer: Self-pay | Admitting: Internal Medicine

## 2017-03-11 DIAGNOSIS — Z1231 Encounter for screening mammogram for malignant neoplasm of breast: Secondary | ICD-10-CM

## 2017-04-21 ENCOUNTER — Ambulatory Visit: Payer: Medicare Other

## 2017-05-25 ENCOUNTER — Emergency Department (HOSPITAL_COMMUNITY)
Admission: EM | Admit: 2017-05-25 | Discharge: 2017-05-26 | Disposition: A | Discharge status: Home / Self Care | Payer: Medicare Other | Attending: Emergency Medicine | Admitting: Emergency Medicine

## 2017-05-25 ENCOUNTER — Emergency Department (HOSPITAL_COMMUNITY): Payer: Medicare Other

## 2017-05-25 ENCOUNTER — Encounter (HOSPITAL_COMMUNITY): Payer: Self-pay | Admitting: Emergency Medicine

## 2017-05-25 DIAGNOSIS — Z7984 Long term (current) use of oral hypoglycemic drugs: Secondary | ICD-10-CM | POA: Diagnosis not present

## 2017-05-25 DIAGNOSIS — M25511 Pain in right shoulder: Secondary | ICD-10-CM | POA: Diagnosis present

## 2017-05-25 DIAGNOSIS — I10 Essential (primary) hypertension: Secondary | ICD-10-CM | POA: Diagnosis not present

## 2017-05-25 DIAGNOSIS — E119 Type 2 diabetes mellitus without complications: Secondary | ICD-10-CM | POA: Diagnosis not present

## 2017-05-25 DIAGNOSIS — F17210 Nicotine dependence, cigarettes, uncomplicated: Secondary | ICD-10-CM | POA: Diagnosis not present

## 2017-05-25 DIAGNOSIS — Z79899 Other long term (current) drug therapy: Secondary | ICD-10-CM | POA: Diagnosis not present

## 2017-05-25 MED ORDER — KETOROLAC TROMETHAMINE 30 MG/ML IJ SOLN
15.0000 mg | Freq: Once | INTRAMUSCULAR | Status: AC
  Administered 2017-05-26: 15 mg via INTRAMUSCULAR
  Filled 2017-05-25: qty 1

## 2017-05-25 MED ORDER — NAPROXEN 375 MG PO TABS: 375.0000 mg | ORAL_TABLET | Freq: Two times a day (BID) | ORAL | 0 refills | Status: DC

## 2017-05-25 MED ORDER — LIDOCAINE 5 % EX PTCH
1.0000 | MEDICATED_PATCH | Freq: Once | CUTANEOUS | Status: DC
  Administered 2017-05-26: 1 via TRANSDERMAL
  Filled 2017-05-25: qty 1

## 2017-05-25 MED ORDER — CYCLOBENZAPRINE HCL 10 MG PO TABS: 10.0000 mg | ORAL_TABLET | Freq: Two times a day (BID) | ORAL | 0 refills | Status: DC | PRN

## 2017-05-25 MED ORDER — LIDO-CAPSAICIN-MEN-METHYL SAL 0.5-0.035-5-20 % EX PTCH: 1.0000 | MEDICATED_PATCH | Freq: Two times a day (BID) | CUTANEOUS | 0 refills | Status: DC | PRN

## 2017-05-25 NOTE — Discharge Instructions (Signed)
Please take the Naproxen as prescribed for pain. Do not take any additional NSAIDs including Motrin, Aleve, Ibuprofen, Advil.  Please the the flexeril for muscle relaxation. This medication will make you drowsy so avoid situation that could place you in danger.  Wear the lidocain patch. Follow up with your ortho doctor. Wear the sling at minimal as possible.

## 2017-05-25 NOTE — ED Notes (Signed)
Bed: WA01 Expected date:  Expected time:  Means of arrival:  Comments: 

## 2017-05-25 NOTE — ED Triage Notes (Signed)
Pt reports having pain in right shoulder for the last month. Pt states it may be rotator cuff. Pt reports worsening pain with movement of arm.

## 2017-05-26 DIAGNOSIS — M25511 Pain in right shoulder: Secondary | ICD-10-CM | POA: Diagnosis not present

## 2017-05-26 NOTE — ED Provider Notes (Signed)
[facility] DEPT Provider Note   CSN: [id number] Arrival date & time: 05/25/17  2155     History   Chief Complaint Chief Complaint  Patient presents with  . Shoulder Pain    HPI [name] is a 51 y.o. female.  HPI 51 year old African American female past medical history significant for chronic pain, diabetes, fibromyalgia, hypertension presents to the emergency department today with complaints of right shoulder pain. Patient states the pain has been ongoing for the past 2-3 months but acutely worsened over the past 2-3 days. Patient states it may be her rotator cuff. States that pain is worse with movement. States that she is unable to get her arm over her head to the pain in her shoulder. Pain is worse at night when she lies down. Nothing makes better. She has tried Tylenol with little relief. Patient does have orthopedic doctor but has not followed up with them. Denies fever, chills, chest pain, shortness of breath, paresthesias, weakness. Past Medical History:  Diagnosis Date  . Chronic back pain   . Diabetes mellitus   . Fibromyalgia   . Hypertension   . Pancreatitis     Patient Active Problem List   Diagnosis Date Noted  . Chronic back pain     Past Surgical History:  Procedure Laterality Date  . ABDOMINAL HYSTERECTOMY    . BACK SURGERY    . BONE GRAFT HIP ILIAC CREST    . CARPAL TUNNEL RELEASE    . CESAREAN SECTION    . frontal fusion    . neck fusion     patient reported    OB History    No data available       Home Medications    Prior to Admission medications   Medication Sig Start Date End Date Taking? Authorizing Provider  albuterol (PROVENTIL HFA;VENTOLIN HFA) 108 (90 BASE) MCG/ACT inhaler Inhale 2 puffs into the lungs every 4 (four) hours as needed for wheezing.    [provider]  ALPRAZolam Duanne Moron) 0.5 MG tablet Take 0.5 mg by mouth 3 (three) times daily as needed for anxiety.    [provider]  AZOR 5-40 MG per  tablet Take 1 tablet by mouth daily.  02/06/14   [provider]  cyclobenzaprine (FLEXERIL) 10 MG tablet Take 1 tablet (10 mg total) by mouth 2 (two) times daily as needed for muscle spasms. 05/25/17   Doristine Devoid, PA-C  fluticasone (FLONASE) 50 MCG/ACT nasal spray instill 2 sprays into each nostril once daily 10/31/15   [provider]  gabapentin (NEURONTIN) 300 MG capsule Take 300 mg by mouth 3 (three) times daily.    [provider]  HYDROcodone-acetaminophen (NORCO/VICODIN) 5-325 MG tablet Take 1 tablet by mouth every 4 (four) hours as needed. 06/22/15   Marella Chimes, PA-C  INVOKANA 100 MG TABS tablet Take 100 mg by mouth daily. 07/22/14   [provider]  Lido-Capsaicin-Men-Methyl Sal 0.5-0.035-5-20 % PTCH Apply 1 patch topically 2 (two) times daily as needed. 05/25/17   Doristine Devoid, PA-C  meloxicam (MOBIC) 15 MG tablet Take 1 tablet (15 mg total) by mouth daily. 08/22/14   [name], MD  metFORMIN (GLUCOPHAGE) 500 MG tablet Take 500 mg by mouth 2 (two) times daily with a meal.    [provider]  metoprolol succinate (TOPROL-XL) 50 MG 24 hr tablet Take 50 mg by mouth daily. 07/21/14   [provider]  naproxen (NAPROSYN) 375 MG tablet Take 1 tablet (  375 mg total) by mouth 2 (two) times daily. 05/25/17   Doristine Devoid, PA-C  neomycin-polymyxin-hydrocortisone (CORTISPORIN) otic solution Apply one to two drops to toes after soaking twice daily. 11/16/15   Hyatt, Max T, DPM  NICODERM CQ 21 MG/24HR patch apply 1 patch once daily 08/23/15   [provider]  omeprazole (PRILOSEC) 20 MG capsule Take 20 mg by mouth daily.    [provider]  oxyCODONE-acetaminophen (PERCOCET/ROXICET) 5-325 MG tablet Take 1-2 tablets by mouth every 6 (six) hours as needed for severe pain. 06/18/15   Carlisle Cater, PA-C  phentermine (ADIPEX-P) 37.5 MG tablet Take 37.5 mg by mouth daily. 10/31/15   [provider]  promethazine (PHENERGAN) 25 MG tablet Take 1 tablet (25 mg total) by mouth every 6 (six) hours as needed for nausea or vomiting. 06/22/15   Marella Chimes, PA-C  simvastatin (ZOCOR) 20 MG tablet Take 20 mg by mouth at bedtime.      [provider]    Family History Family History  Problem Relation Age of Onset  . Hypertension Father   . Renal Disease Father   . Diabetes Mother   . Hypertension Mother   . Edema Mother   . Diabetes Sister   . Diabetes Brother     Social History Social History  Substance Use Topics  . Smoking status: Current Every Day Smoker    Packs/day: 1.00    Types: Cigarettes  . Smokeless tobacco: Never Used  . Alcohol use No     Allergies   Patient has no known allergies.   Review of Systems Review of Systems  Constitutional: Negative for chills and fever.  Musculoskeletal: Positive for arthralgias and joint swelling. Negative for neck pain.  Skin: Negative for color change.  Neurological: Negative for weakness and numbness.     Physical Exam Updated Vital Signs BP (!) 171/86 (BP Location: Left Arm)   Pulse 77   Temp 98.3 F (36.8 C) (Oral)   Resp 20   Ht 5\' 5"  (1.651 m)   Wt 93 kg (205 lb)   SpO2 98%   BMI 34.11 kg/m   Physical Exam  Constitutional: She appears well-developed and well-nourished. No distress.  HENT:  Head: Normocephalic and atraumatic.  Eyes: Right eye exhibits no discharge. Left eye exhibits no discharge. No scleral icterus.  Neck: Normal range of motion.  Pulmonary/Chest: No respiratory distress.  Musculoskeletal:       Right shoulder: She exhibits decreased range of motion, tenderness, bony tenderness and pain. She exhibits no swelling, no effusion, no crepitus, no deformity, no spasm, normal pulse and normal strength.  No, warmth, erythema or edema noted. No obvious deformity.  adial pulses 2+ bilaterally. Cap refill normal. Positive hawkins and neers test.  Neurological: She is alert.    Grip strength normal. Sensation intact in all dermatomes.  Skin: No pallor.  Psychiatric: Her behavior is normal. Judgment and thought content normal.  Nursing note and vitals reviewed.    ED Treatments / Results  Labs (all labs ordered are listed, but only abnormal results are displayed) Labs Reviewed - No data to display  EKG  EKG Interpretation None       Radiology Dg Shoulder Right  Result Date: 05/25/2017 CLINICAL DATA:  Right shoulder pain for a long time.  No injury. EXAM: RIGHT SHOULDER - 2+ VIEW COMPARISON:  08/22/2014 FINDINGS: Degenerative changes in the right shoulder involving the glenohumeral and acromioclavicular joints. Prominent subacromial spur which is increased since  the previous study. No evidence of acute fracture or dislocation. No focal bone lesion or bone destruction. Soft tissues are unremarkable. IMPRESSION: Degenerative changes in the right shoulder. Prominent subacromial spur which is increased since previous study. Electronically Signed   By: Lucienne Capers M.D.   On: 05/25/2017 22:54    Procedures Procedures (including critical care time)  Medications Ordered in ED Medications  lidocaine (LIDODERM) 5 % 1 patch (1 patch Transdermal Patch Applied 05/26/17 0001)  ketorolac (TORADOL) 30 MG/ML injection 15 mg (15 mg Intramuscular Given 05/26/17 0002)     Initial Impression / Assessment and Plan / ED Course  I have reviewed the triage vital signs and the nursing notes.  Pertinent labs & imaging results that were available during my care of the patient were reviewed by me and considered in my medical decision making (see chart for details).     Patient complains of chronic right shoulder pain. Neurovascularly intact.Patient X-Ray negative for obvious fracture or dislocation. Pain managed in ED. Pt advised to follow up with orthopedics if symptoms persist for possibility of missed fracture diagnosis. Patient given brace while in ED, conservative  therapy recommended and discussed. Patient will be dc home & is agreeable with above plan.   Final Clinical Impressions(s) / ED Diagnoses   Final diagnoses:  Acute pain of right shoulder    New Prescriptions New Prescriptions   CYCLOBENZAPRINE (FLEXERIL) 10 MG TABLET    Take 1 tablet (10 mg total) by mouth 2 (two) times daily as needed for muscle spasms.   LIDO-CAPSAICIN-MEN-METHYL SAL 0.5-0.035-5-20 % PTCH    Apply 1 patch topically 2 (two) times daily as needed.   NAPROXEN (NAPROSYN) 375 MG TABLET    Take 1 tablet (375 mg total) by mouth 2 (two) times daily.     Doristine Devoid, PA-C 05/26/17 0005    Julianne Rice, MD 05/27/17 [phone]

## 2017-05-29 ENCOUNTER — Ambulatory Visit (INDEPENDENT_AMBULATORY_CARE_PROVIDER_SITE_OTHER): Payer: Medicare Other | Admitting: Surgery

## 2017-05-29 ENCOUNTER — Ambulatory Visit (INDEPENDENT_AMBULATORY_CARE_PROVIDER_SITE_OTHER): Payer: Medicare Other

## 2017-05-29 ENCOUNTER — Encounter (INDEPENDENT_AMBULATORY_CARE_PROVIDER_SITE_OTHER): Payer: Self-pay | Admitting: Surgery

## 2017-05-29 VITALS — BP 130/87 | HR 100 | Ht 65.0 in | Wt 200.0 lb

## 2017-05-29 DIAGNOSIS — M25511 Pain in right shoulder: Secondary | ICD-10-CM | POA: Diagnosis not present

## 2017-05-29 DIAGNOSIS — M5412 Radiculopathy, cervical region: Secondary | ICD-10-CM

## 2017-05-29 DIAGNOSIS — G8929 Other chronic pain: Secondary | ICD-10-CM

## 2017-05-29 DIAGNOSIS — M542 Cervicalgia: Secondary | ICD-10-CM | POA: Diagnosis not present

## 2017-06-03 ENCOUNTER — Ambulatory Visit
Admission: RE | Admit: 2017-06-03 | Discharge: 2017-06-03 | Disposition: A | Discharge status: Home / Self Care | Payer: Medicare Other | Source: Ambulatory Visit | Attending: Internal Medicine | Admitting: Internal Medicine

## 2017-06-03 DIAGNOSIS — Z1231 Encounter for screening mammogram for malignant neoplasm of breast: Secondary | ICD-10-CM

## 2017-06-12 ENCOUNTER — Telehealth (INDEPENDENT_AMBULATORY_CARE_PROVIDER_SITE_OTHER): Payer: Self-pay | Admitting: *Deleted

## 2017-06-12 NOTE — Telephone Encounter (Signed)
Pt called left vm for me to return call in reference to referral. I called pt back, no answer, vm not set up. Will try again later.

## 2017-06-26 ENCOUNTER — Ambulatory Visit
Admission: RE | Admit: 2017-06-26 | Discharge: 2017-06-26 | Disposition: A | Discharge status: Home / Self Care | Payer: Medicare Other | Source: Ambulatory Visit | Attending: Surgery | Admitting: Surgery

## 2017-06-26 ENCOUNTER — Other Ambulatory Visit: Payer: Medicare Other

## 2017-06-26 DIAGNOSIS — M25511 Pain in right shoulder: Principal | ICD-10-CM

## 2017-06-26 DIAGNOSIS — M5412 Radiculopathy, cervical region: Secondary | ICD-10-CM

## 2017-06-26 DIAGNOSIS — G8929 Other chronic pain: Secondary | ICD-10-CM

## 2017-06-30 ENCOUNTER — Encounter (INDEPENDENT_AMBULATORY_CARE_PROVIDER_SITE_OTHER): Payer: Self-pay | Admitting: Specialist

## 2017-06-30 ENCOUNTER — Ambulatory Visit (INDEPENDENT_AMBULATORY_CARE_PROVIDER_SITE_OTHER): Payer: Medicare Other | Admitting: Specialist

## 2017-06-30 VITALS — BP 136/79 | HR 105 | Ht 64.0 in | Wt 205.0 lb

## 2017-06-30 DIAGNOSIS — M75121 Complete rotator cuff tear or rupture of right shoulder, not specified as traumatic: Secondary | ICD-10-CM | POA: Diagnosis not present

## 2017-06-30 DIAGNOSIS — M5020 Other cervical disc displacement, unspecified cervical region: Secondary | ICD-10-CM | POA: Diagnosis not present

## 2017-06-30 DIAGNOSIS — M5412 Radiculopathy, cervical region: Secondary | ICD-10-CM | POA: Diagnosis not present

## 2017-06-30 NOTE — Progress Notes (Signed)
Office Visit Note   [name]           Date of Birth: 23-Nov-1965           MRN: [id number] Visit Date: 05/29/2017              Requested by: Nolene Ebbs, MD [address] [place], [place] [address] PCP: Nolene Ebbs, MD   Assessment & Plan: Visit Diagnoses:  1. Cervicalgia   2. Chronic right shoulder pain   3. Radiculopathy, cervical region     Plan: with  patient's ongoing and worsening symptoms we will schedule MRI of the cervical spine and right shoulder. Follow up in the office after completion to discuss results and further treatment options.  All questions answered.   Follow-Up Instructions: Return in about 3 weeks (around 06/19/2017) for with Dr Louanne Skye to review MRI cervical spine and right shoulder.   Orders:  Orders Placed This Encounter  Procedures  . XR Shoulder Right  . XR Cervical Spine 2 or 3 views  . MR Cervical Spine w/o contrast  . MR SHOULDER RIGHT WO CONTRAST   No orders of the defined types were placed in this encounter.     Procedures: No procedures performed   Clinical Data: No additional findings.   Subjective: Chief Complaint  Patient presents with  . Neck - Pain  . Right Shoulder - Pain    HPI Patient is in with complaints of neck pain, right arm pain and right shoulder pain. States that all areas of been worsening 2 years. Patient was last seen by Dr. Merrilee Seashore of February 2016 with complaints of neck and right shoulder issues. Patient has had previous C5-6 ACDF 2011. She was seen in the emergency room 05/25/2017 for right shoulder pain. Was given a Toradol injection. No dramatic improvement. Right shoulder and arm pain described as being constant but aggravated with overhead activity and reaching on her back. Feels like her right arm is progressively getting week. Pain does awaken her at night when she lies on her right side. Review of Systems No current cardiac pulmonary GI GU issues  Objective: Vital Signs: BP 130/87    Pulse 100   Ht 5\' 5"  (1.651 m)   Wt 200 lb (90.7 kg)   BMI 33.28 kg/m   Physical Exam  Constitutional: She is oriented to person, place, and time. She appears well-developed. No distress.  HENT:  Head: Normocephalic and atraumatic.  Eyes: EOM are normal. Pupils are equal, round, and reactive to light.  Neck:  Limited cervical spine range of motion. Positive right brachial plexus or trapezius tenderness.  Pulmonary/Chest: No respiratory distress.  Musculoskeletal:  Right shoulder positive impingement test. Decreased active shoulder range of motion. Negative drop arm. Pain and weakness with supraspinatus resistance.  Neurological: She is alert and oriented to person, place, and time.  Skin: Skin is warm and dry.  Psychiatric: She has a normal mood and affect.    Ortho Exam  Specialty Comments:  No specialty comments available.  Imaging: No results found.   PMFS History: Patient Active Problem List   Diagnosis Date Noted  . Chronic back pain    Past Medical History:  Diagnosis Date  . Chronic back pain   . Diabetes mellitus   . Fibromyalgia   . Hypertension   . Pancreatitis     Family History  Problem Relation Age of Onset  . Hypertension Father   . Renal Disease Father   . Diabetes Mother   .  Hypertension Mother   . Edema Mother   . Diabetes Sister   . Diabetes Brother     Past Surgical History:  Procedure Laterality Date  . ABDOMINAL HYSTERECTOMY    . BACK SURGERY    . BONE GRAFT HIP ILIAC CREST    . CARPAL TUNNEL RELEASE    . CESAREAN SECTION    . frontal fusion    . neck fusion     patient reported   Social History   Occupational History  . Not on file  Tobacco Use  . Smoking status: Current Every Day Smoker    Packs/day: 1.00    Types: Cigarettes  . Smokeless tobacco: Never Used  Substance and Sexual Activity  . Alcohol use: No  . Drug use: No  . Sexual activity: Not on file

## 2017-06-30 NOTE — Patient Instructions (Addendum)
Avoid overhead lifting and overhead use of the arms. Do not lift greater than 5 lbs. Adjust head rest in vehicle to prevent hyperextension if rear ended. Take extra precautions to avoid falling, including use of a cane if you feel weak. Scheduling secretary Kandice Hams. will call you to arrange for surgery for your cervical spine. If you wish a second opinion please let us know and we can arrange for you. If you have worsening arm or leg numbness or weakness please call or go to an ER. We will contact your cardiologist and primary care physicians to seek clearance for your surgery. Surgery will be an anterior cervical discectomy and fusion at the C6-7 level with decompression of the cervical spinal canal at C6-7 and removal of disc herniation pressing on the spinal cord.with bone grafting and plate and screws, Local bone graft as well and allograft bone graft and vivigen.  Risks of surgery include risks of infection 1 in 100, bleeding minimal less than 08/998 of blood transfusion and risks to the spinal cord and  Risks of sore throat and difficulty swallowing which should  Improve over the next 4-6 weeks following surgery. Surgery is indicated due to upper extremity radiculopathy, lermittes phenomena and falls. In the future surgery at adjacent levels may be necessary but these levels do not appear to be related to your current symptoms or signs.  Appointment for Dr. Marlou Sa to assess Rotator cuff tear and be working to plan for treatment of the shoulder after neck surgery is 6 weeks post op

## 2017-06-30 NOTE — Progress Notes (Signed)
Office Visit Note   [name]           Date of Birth: [birth year]           MRN: [id number] Visit Date: 06/30/2017              Requested by: Nolene Ebbs, MD [address] [place],  [address] PCP: Nolene Ebbs, MD   Assessment & Plan: Visit Diagnoses:  1. Herniated cervical intervertebral disc   2. Cervical radiculopathy at C7   3. Complete tear of right rotator cuff     Plan:Avoid overhead lifting and overhead use of the arms. Do not lift greater than 5 lbs. Adjust head rest in vehicle to prevent hyperextension if rear ended. Take extra precautions to avoid falling, including use of a cane if you feel weak. Scheduling secretary Kandice Hams. will call you to arrange for surgery for your cervical spine. If you wish a second opinion please let us know and we can arrange for you. If you have worsening arm or leg numbness or weakness please call or go to an ER. We will contact your cardiologist and primary care physicians to seek clearance for your surgery. Surgery will be an anterior cervical discectomy and fusion at the C6-7 level with decompression of the cervical spinal canal at C6-7 and removal of disc herniation pressing on the spinal cord.with bone grafting and plate and screws, Local bone graft as well and allograft bone graft and vivigen.  Risks of surgery include risks of infection 1 in 100, bleeding minimal less than 08/998 of blood transfusion and risks to the spinal cord and  Risks of sore throat and difficulty swallowing which should  Improve over the next 4-6 weeks following surgery. Surgery is indicated due to upper extremity radiculopathy, lermittes phenomena and falls. In the future surgery at adjacent levels may be necessary but these levels do not appear to be related to your current symptoms or signs Appointment for Dr. Marlou Sa to assess Rotator cuff tear and be working to plan for treatment of the shoulder after neck surgery is 6 weeks  post op Follow-Up Instructions: Return in about 2 weeks (around 07/14/2017) for Schedule an appointment with Dr. Marlou Sa for right rotator cuff tear. .   Orders:  No orders of the defined types were placed in this encounter.  No orders of the defined types were placed in this encounter.     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  MRI right shoulder with full thickess rotator cuff tear supraspinatous, severe tendonopathy supraspinatous, no atrophy but 2 cm of retraction, fluid in the G-H joint and moderate DJD of the AC joint.     Subjective: Chief Complaint  Patient presents with  . Neck - Follow-up    MRI review  . Right Shoulder - Follow-up    MRI Review    51 year old female right hand dominate, with history of neck pain and right and left shoulder pain. Pain is present with difficulty sleeping, not sleeping more than 3 hours a night. Pain is keeping her awake at night. Hard to get head off the pillow. She complains of right hand and elbow pain and numbness. See in PT referred by Dr. Jeanie Cooks, underwent to  PT last year in 2017, has had injection of the right shoulder without help. Injection of the shoulder was done at urgent care about one year ago prior to PT.  The right arm and hand is weak, she is dropping items, dropping coffee cup. Pain is worsening. No bowel or bladder difficulty. When walking she as fallen a couple of time. Drove her car to parking lot and threw pocket book over her shoulder and then next thing she new they were picking her up off the ground. Saw her primary care MD and  He wasn't sure. She has had two episodes 7-8 months ago and 9-10 months ago. Dr. Jeanie Cooks placed her on oxycodone. She sleeps with a lot of pillows  every since her fusion.     Review of Systems  Constitutional: Negative.   HENT: Negative.   Eyes: Negative.   Respiratory: Negative.   Cardiovascular: Negative.   Gastrointestinal: Negative.   Endocrine: Negative.   Genitourinary: Negative.   Musculoskeletal: Negative.   Skin: Negative.   Allergic/Immunologic: Negative.   Neurological: Negative.   Hematological: Negative.   Psychiatric/Behavioral: Negative.      Objective: Vital Signs: BP 136/79 (BP Location: Left Arm, Patient Position: Sitting)   Pulse (!) 105   Ht 5\' 4"  (1.626 m)   Wt 205 lb (93 kg)   BMI 35.19 kg/m   Physical Exam  Constitutional: She is oriented to person, place, and time. She appears well-developed and well-nourished.  HENT:  Head: Normocephalic and atraumatic.  Eyes: EOM are normal. Pupils are equal, round, and reactive to light.  Neck: Normal range of motion. Neck supple.  Pulmonary/Chest: Effort normal and breath sounds normal.  Abdominal: Soft. Bowel sounds are normal.  Neurological: She is alert and oriented to person, place, and time.  Skin: Skin is warm and dry.  Psychiatric: She has a normal mood and affect. Her behavior is normal. Judgment and thought content normal.    Back Exam   Tenderness  The patient is experiencing tenderness in the cervical.  Range of Motion  Extension: 30  Flexion: 60  Lateral bend right:  30 abnormal  Lateral bend left: 70  Rotation right:  30 abnormal  Rotation left: 70   Muscle Strength  Right Quadriceps:  5/5  Left Quadriceps:  5/5  Right Hamstrings:  5/5  Left Hamstrings:  5/5   Tests  Straight leg raise right: negative Straight leg raise left: negative  Reflexes  Patellar: normal Achilles: normal Biceps: abnormal Babinski's sign: normal   Other  Heel walk: normal Sensation: normal  Comments:  Right shoulder weakness 3/5, positive impingement, Right biceps 5/5, right triceps 4/5, right wrist volar flexion 5-/5, right finger  extension is 4+/5 Left motor is normal. Painful ROM right shoulder.    Right Shoulder Exam   Tenderness  The patient is experiencing no tenderness.  Range of Motion  Active abduction:  90 abnormal  Passive abduction: 90  Extension: 30  External rotation: 80  Forward flexion: 90  Internal rotation 0 degrees: T6  Internal rotation 90 degrees: 50   Muscle Strength  Abduction: 3/5  Internal rotation: 4/5  External rotation: 4/5  Supraspinatus: 4/5  Subscapularis: 4/5  Biceps: 5/5   Tests  Apprehension: positive Impingement: positive Drop arm: positive  Other  Erythema: absent Scars: absent Sensation: decreased      Specialty Comments:  No specialty comments available.  Imaging: No results found.   PMFS History: Patient Active Problem List   Diagnosis Date Noted  .  Chronic back pain    Past Medical History:  Diagnosis Date  . Chronic back pain   . Diabetes mellitus   . Fibromyalgia   . Hypertension   . Pancreatitis     Family History  Problem Relation Age of Onset  . Hypertension Father   . Renal Disease Father   . Diabetes Mother   . Hypertension Mother   . Edema Mother   . Diabetes Sister   . Diabetes Brother     Past Surgical History:  Procedure Laterality Date  . ABDOMINAL HYSTERECTOMY    . BACK SURGERY    . BONE GRAFT HIP ILIAC CREST    . CARPAL TUNNEL RELEASE    . CESAREAN SECTION    . frontal fusion    . neck fusion     patient reported   Social History   Occupational History  . Not on file  Tobacco Use  . Smoking status: Current Every Day Smoker    Packs/day: 1.00    Types: Cigarettes  . Smokeless tobacco: Never Used  Substance and Sexual Activity  . Alcohol use: No  . Drug use: No  . Sexual activity: Not on file

## 2017-07-14 ENCOUNTER — Ambulatory Visit (INDEPENDENT_AMBULATORY_CARE_PROVIDER_SITE_OTHER): Payer: Medicare Other | Admitting: Orthopedic Surgery

## 2017-07-18 ENCOUNTER — Other Ambulatory Visit: Payer: Self-pay

## 2017-07-18 ENCOUNTER — Encounter (HOSPITAL_COMMUNITY): Payer: Self-pay | Admitting: *Deleted

## 2017-07-18 MED ORDER — CEFAZOLIN SODIUM-DEXTROSE 2-4 GM/100ML-% IV SOLN
2.0000 g | INTRAVENOUS | Status: AC
  Administered 2017-07-21: 2 g via INTRAVENOUS
  Filled 2017-07-18: qty 100

## 2017-07-18 NOTE — Progress Notes (Signed)
Spoke with pt for pre-op call. Pt denies cardiac history, chest pain or sob. Pt is a type 2 diabetic. She states her fasting blood sugar is usually between 120-130. She's not sure what her A1C was or exactly when it was. Pt has been on Phentermine, instructed pt to stop that as of today. She voiced understanding. Instructed pt not to take Metformin the morning of surgery, instructed her to check her blood sugar when she gets up. If blood sugar is >220 take 1/2 of usual correction dose of Novolog insulin. If blood sugar is 70 or below, treat with 1/2 cup of clear juice (apple or cranberry) and recheck blood sugar 15 minutes after drinking juice. Pt voiced understanding.

## 2017-07-20 NOTE — Anesthesia Preprocedure Evaluation (Addendum)
Anesthesia Evaluation  Patient identified by MRN, date of birth, ID band Patient awake    Reviewed: Allergy & Precautions, NPO status , Patient's Chart, lab work & pertinent test results  History of Anesthesia Complications Negative for: history of anesthetic complications  Airway Mallampati: II  TM Distance: >3 FB Neck ROM: Limited    Dental  (+) Teeth Intact, Dental Advisory Given   Pulmonary neg pulmonary ROS, Current Smoker,    breath sounds clear to auscultation       Cardiovascular hypertension,  Rhythm:Regular Rate:Normal     Neuro/Psych  Neuromuscular disease    GI/Hepatic negative GI ROS, Neg liver ROS,   Endo/Other  diabetes  Renal/GU      Musculoskeletal  (+) Arthritis , Fibromyalgia -  Abdominal   Peds  Hematology   Anesthesia Other Findings   Reproductive/Obstetrics                            Anesthesia Physical Anesthesia Plan  ASA: II  Anesthesia Plan: General   Post-op Pain Management:    Induction: Intravenous  PONV Risk Score and Plan: 2 and 3 and Dexamethasone  Airway Management Planned: Oral ETT  Additional Equipment:   Intra-op Plan:   Post-operative Plan: Extubation in OR  Informed Consent: I have reviewed the patients History and Physical, chart, labs and discussed the procedure including the risks, benefits and alternatives for the proposed anesthesia with the patient or authorized representative who has indicated his/her understanding and acceptance.     Plan Discussed with: CRNA  Anesthesia Plan Comments:         Anesthesia Quick Evaluation

## 2017-07-20 NOTE — H&P (Signed)
[name] is an 51 y.o. female.   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  Progressively worsening symptoms.  Failed conservative treatment.    Past Medical History:  Diagnosis Date  . Anxiety   . Arthritis    rheumatoid , osteoarthritis  . Bronchitis   . Chronic back pain   . Depression   . Diabetes mellitus   . Fatty liver   . Fibromyalgia   . Hypertension   . Pancreatitis   . Pneumonia     Past Surgical History:  Procedure Laterality Date  . ABDOMINAL HYSTERECTOMY    . BACK SURGERY     laminectomy  . BONE GRAFT HIP ILIAC CREST    . CARPAL TUNNEL RELEASE    . CESAREAN SECTION    . CHOLECYSTECTOMY    . COLONOSCOPY    . frontal fusion    . neck fusion     patient reported    Family History  Problem Relation Age of Onset  . Hypertension Father   . Renal Disease Father   . Diabetes Mother   . Hypertension Mother   . Edema Mother   . Diabetes Sister   . Diabetes Brother    Social History:  reports that she has been smoking cigarettes.  She has been smoking about 1.00 pack per day. she has never used smokeless tobacco. She reports that she does not drink alcohol or use drugs.  Allergies: No Known Allergies  No medications prior to admission.    No results found for this or any previous visit (from the past 48 hour(s)). No results found.  Review of Systems  Constitutional: Negative.   HENT: Negative.   Respiratory: Negative.   Cardiovascular: Negative.   Gastrointestinal: Negative.   Genitourinary: Negative.   Musculoskeletal: Positive for neck pain.  Skin: Negative.   Neurological: Positive for tingling.  Psychiatric/Behavioral: Negative.     There were no vitals taken for this visit. 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  Surgical procedure along with possible risks and complications discussed.  All questions answered and wishes to proceed.   Benjiman Core, PA-C 07/20/2017, 6:56 PM

## 2017-07-21 ENCOUNTER — Ambulatory Visit (HOSPITAL_COMMUNITY)
Admission: RE | Disposition: A | Discharge status: Home / Self Care | Payer: Self-pay | Source: Ambulatory Visit | Attending: Specialist

## 2017-07-21 ENCOUNTER — Ambulatory Visit (HOSPITAL_COMMUNITY): Payer: Medicare Other

## 2017-07-21 ENCOUNTER — Ambulatory Visit (HOSPITAL_COMMUNITY)
Admission: RE | Admit: 2017-07-21 | Discharge: 2017-07-23 | Disposition: A | Discharge status: Home / Self Care | Payer: Medicare Other | Source: Ambulatory Visit | Attending: Specialist | Admitting: Specialist

## 2017-07-21 ENCOUNTER — Ambulatory Visit (HOSPITAL_COMMUNITY): Payer: Medicare Other | Admitting: Anesthesiology

## 2017-07-21 ENCOUNTER — Encounter (HOSPITAL_COMMUNITY): Payer: Self-pay | Admitting: *Deleted

## 2017-07-21 DIAGNOSIS — Z969 Presence of functional implant, unspecified: Secondary | ICD-10-CM | POA: Diagnosis not present

## 2017-07-21 DIAGNOSIS — F17210 Nicotine dependence, cigarettes, uncomplicated: Secondary | ICD-10-CM | POA: Diagnosis not present

## 2017-07-21 DIAGNOSIS — I10 Essential (primary) hypertension: Secondary | ICD-10-CM | POA: Insufficient documentation

## 2017-07-21 DIAGNOSIS — Z79899 Other long term (current) drug therapy: Secondary | ICD-10-CM | POA: Diagnosis not present

## 2017-07-21 DIAGNOSIS — R600 Localized edema: Secondary | ICD-10-CM | POA: Diagnosis not present

## 2017-07-21 DIAGNOSIS — Z981 Arthrodesis status: Secondary | ICD-10-CM

## 2017-07-21 DIAGNOSIS — E119 Type 2 diabetes mellitus without complications: Secondary | ICD-10-CM | POA: Insufficient documentation

## 2017-07-21 DIAGNOSIS — M2578 Osteophyte, vertebrae: Secondary | ICD-10-CM | POA: Insufficient documentation

## 2017-07-21 DIAGNOSIS — M542 Cervicalgia: Secondary | ICD-10-CM | POA: Diagnosis present

## 2017-07-21 DIAGNOSIS — T84226A Displacement of internal fixation device of vertebrae, initial encounter: Secondary | ICD-10-CM | POA: Diagnosis not present

## 2017-07-21 DIAGNOSIS — M5020 Other cervical disc displacement, unspecified cervical region: Secondary | ICD-10-CM | POA: Diagnosis not present

## 2017-07-21 DIAGNOSIS — Z7984 Long term (current) use of oral hypoglycemic drugs: Secondary | ICD-10-CM | POA: Diagnosis not present

## 2017-07-21 DIAGNOSIS — M4802 Spinal stenosis, cervical region: Secondary | ICD-10-CM | POA: Insufficient documentation

## 2017-07-21 DIAGNOSIS — Y838 Other surgical procedures as the cause of abnormal reaction of the patient, or of later complication, without mention of misadventure at the time of the procedure: Secondary | ICD-10-CM | POA: Diagnosis not present

## 2017-07-21 DIAGNOSIS — K760 Fatty (change of) liver, not elsewhere classified: Secondary | ICD-10-CM | POA: Insufficient documentation

## 2017-07-21 DIAGNOSIS — Z7982 Long term (current) use of aspirin: Secondary | ICD-10-CM | POA: Diagnosis not present

## 2017-07-21 DIAGNOSIS — M50123 Cervical disc disorder at C6-C7 level with radiculopathy: Secondary | ICD-10-CM | POA: Insufficient documentation

## 2017-07-21 DIAGNOSIS — F419 Anxiety disorder, unspecified: Secondary | ICD-10-CM | POA: Insufficient documentation

## 2017-07-21 DIAGNOSIS — M75101 Unspecified rotator cuff tear or rupture of right shoulder, not specified as traumatic: Secondary | ICD-10-CM | POA: Diagnosis not present

## 2017-07-21 DIAGNOSIS — Z419 Encounter for procedure for purposes other than remedying health state, unspecified: Secondary | ICD-10-CM

## 2017-07-21 HISTORY — DX: Anxiety disorder, unspecified: F41.9

## 2017-07-21 HISTORY — PX: CERVICAL DISCECTOMY: SHX98

## 2017-07-21 HISTORY — DX: Fatty (change of) liver, not elsewhere classified: K76.0

## 2017-07-21 HISTORY — DX: Depression, unspecified: F32.A

## 2017-07-21 HISTORY — PX: ANTERIOR CERVICAL DECOMP/DISCECTOMY FUSION: SHX1161

## 2017-07-21 HISTORY — DX: Bronchitis, not specified as acute or chronic: J40

## 2017-07-21 HISTORY — DX: Major depressive disorder, single episode, unspecified: F32.9

## 2017-07-21 HISTORY — DX: Pneumonia, unspecified organism: J18.9

## 2017-07-21 HISTORY — DX: Unspecified osteoarthritis, unspecified site: M19.90

## 2017-07-21 LAB — COMPREHENSIVE METABOLIC PANEL
ALBUMIN: 4.3 g/dL (ref 3.5–5.0)
ALK PHOS: 96 U/L (ref 38–126)
ALT: 22 U/L (ref 14–54)
ANION GAP: 13 (ref 5–15)
AST: 23 U/L (ref 15–41)
BUN: 15 mg/dL (ref 6–20)
CALCIUM: 9.3 mg/dL (ref 8.9–10.3)
CO2: 21 mmol/L — AB (ref 22–32)
CREATININE: 0.59 mg/dL (ref 0.44–1.00)
Chloride: 103 mmol/L (ref 101–111)
GFR calc Af Amer: >60 mL/min (ref >60)
GFR calc non Af Amer: >60 mL/min (ref >60)
GLUCOSE: 224 mg/dL — AB (ref 65–99)
Potassium: 4 mmol/L (ref 3.5–5.1)
SODIUM: 137 mmol/L (ref 135–145)
TOTAL PROTEIN: 8.2 g/dL — AB (ref 6.5–8.1)
Total Bilirubin: 0.5 mg/dL (ref 0.3–1.2)

## 2017-07-21 LAB — CBC
HCT: 45.4 % (ref 36.0–46.0)
HEMOGLOBIN: 15.5 g/dL — AB (ref 12.0–15.0)
MCH: 29.1 pg (ref 26.0–34.0)
MCHC: 34.1 g/dL (ref 30.0–36.0)
MCV: 85.2 fL (ref 78.0–100.0)
Platelets: 308 10*3/uL (ref 150–400)
RBC: 5.33 MIL/uL — AB (ref 3.87–5.11)
RDW: 13.4 % (ref 11.5–15.5)
WBC: 10.7 10*3/uL — ABNORMAL HIGH (ref 4.0–10.5)

## 2017-07-21 LAB — APTT: APTT: 36 s (ref 24–36)

## 2017-07-21 LAB — GLUCOSE, CAPILLARY
GLUCOSE-CAPILLARY: 217 mg/dL — AB (ref 65–99)
GLUCOSE-CAPILLARY: 262 mg/dL — AB (ref 65–99)
GLUCOSE-CAPILLARY: 263 mg/dL — AB (ref 65–99)
Glucose-Capillary: 199 mg/dL — ABNORMAL HIGH (ref 65–99)
Glucose-Capillary: 237 mg/dL — ABNORMAL HIGH (ref 65–99)

## 2017-07-21 LAB — HEMOGLOBIN A1C
HEMOGLOBIN A1C: 8.5 % — AB (ref 4.8–5.6)
HEMOGLOBIN A1C: 8.3 % — AB (ref 4.8–5.6)
Mean Plasma Glucose: 197.25 mg/dL
Mean Plasma Glucose: 191.51 mg/dL

## 2017-07-21 LAB — PROTIME-INR
INR: 1.07
Prothrombin Time: 13.8 seconds (ref 11.4–15.2)

## 2017-07-21 SURGERY — ANTERIOR CERVICAL DECOMPRESSION/DISCECTOMY FUSION 1 LEVEL/HARDWARE REMOVAL
Anesthesia: General

## 2017-07-21 MED ORDER — ASPIRIN EC 81 MG PO TBEC
81.0000 mg | DELAYED_RELEASE_TABLET | Freq: Every day | ORAL | Status: DC
  Administered 2017-07-21 – 2017-07-23 (×3): 81 mg via ORAL
  Filled 2017-07-21 (×3): qty 1

## 2017-07-21 MED ORDER — SODIUM CHLORIDE 0.9% FLUSH: 3.0000 mL | INTRAVENOUS | Status: DC | PRN

## 2017-07-21 MED ORDER — MIDAZOLAM HCL 2 MG/2ML IJ SOLN
INTRAMUSCULAR | Status: AC
  Filled 2017-07-21: qty 2

## 2017-07-21 MED ORDER — METHOCARBAMOL 500 MG PO TABS
ORAL_TABLET | ORAL | Status: AC
  Filled 2017-07-21: qty 1

## 2017-07-21 MED ORDER — OXYCODONE HCL 5 MG PO TABS
5.0000 mg | ORAL_TABLET | ORAL | Status: DC | PRN
  Administered 2017-07-21 – 2017-07-23 (×6): 10 mg via ORAL
  Filled 2017-07-21 (×5): qty 2

## 2017-07-21 MED ORDER — GABAPENTIN 100 MG PO CAPS
100.0000 mg | ORAL_CAPSULE | Freq: Every day | ORAL | Status: DC
  Administered 2017-07-21 – 2017-07-22 (×2): 100 mg via ORAL
  Filled 2017-07-21 (×3): qty 1

## 2017-07-21 MED ORDER — BUPIVACAINE HCL 0.5 % IJ SOLN
INTRAMUSCULAR | Status: DC | PRN
  Administered 2017-07-21: 10 mL

## 2017-07-21 MED ORDER — POLYETHYLENE GLYCOL 3350 17 G PO PACK: 17.0000 g | PACK | Freq: Every day | ORAL | Status: DC | PRN

## 2017-07-21 MED ORDER — METOPROLOL SUCCINATE ER 50 MG PO TB24
50.0000 mg | ORAL_TABLET | Freq: Every day | ORAL | Status: DC
  Administered 2017-07-21 – 2017-07-23 (×3): 50 mg via ORAL
  Filled 2017-07-21 (×3): qty 1

## 2017-07-21 MED ORDER — SODIUM CHLORIDE 0.9 % IV SOLN: 250.0000 mL | INTRAVENOUS | Status: DC

## 2017-07-21 MED ORDER — BISACODYL 5 MG PO TBEC: 5.0000 mg | DELAYED_RELEASE_TABLET | Freq: Every day | ORAL | Status: DC | PRN

## 2017-07-21 MED ORDER — ONDANSETRON HCL 4 MG PO TABS: 4.0000 mg | ORAL_TABLET | Freq: Four times a day (QID) | ORAL | Status: DC | PRN

## 2017-07-21 MED ORDER — KETAMINE HCL 10 MG/ML IJ SOLN
INTRAMUSCULAR | Status: DC | PRN
  Administered 2017-07-21: 10 mg via INTRAVENOUS
  Administered 2017-07-21: 50 mg via INTRAVENOUS

## 2017-07-21 MED ORDER — DEXAMETHASONE SODIUM PHOSPHATE 10 MG/ML IJ SOLN
INTRAMUSCULAR | Status: AC
  Filled 2017-07-21: qty 1

## 2017-07-21 MED ORDER — ROCURONIUM BROMIDE 10 MG/ML (PF) SYRINGE
PREFILLED_SYRINGE | INTRAVENOUS | Status: AC
  Filled 2017-07-21: qty 5

## 2017-07-21 MED ORDER — ALPRAZOLAM 0.5 MG PO TABS
0.5000 mg | ORAL_TABLET | Freq: Three times a day (TID) | ORAL | Status: DC | PRN
  Administered 2017-07-22 – 2017-07-23 (×4): 0.5 mg via ORAL
  Filled 2017-07-21 (×4): qty 1

## 2017-07-21 MED ORDER — CHLORHEXIDINE GLUCONATE 4 % EX LIQD: 60.0000 mL | Freq: Once | CUTANEOUS | Status: DC

## 2017-07-21 MED ORDER — PHENYLEPHRINE 40 MCG/ML (10ML) SYRINGE FOR IV PUSH (FOR BLOOD PRESSURE SUPPORT)
PREFILLED_SYRINGE | INTRAVENOUS | Status: AC
  Filled 2017-07-21: qty 10

## 2017-07-21 MED ORDER — DULAGLUTIDE 1.5 MG/0.5ML ~~LOC~~ SOAJ: 1.5000 mg | SUBCUTANEOUS | Status: DC

## 2017-07-21 MED ORDER — DOCUSATE SODIUM 100 MG PO CAPS
100.0000 mg | ORAL_CAPSULE | Freq: Two times a day (BID) | ORAL | Status: DC
  Administered 2017-07-21 – 2017-07-23 (×4): 100 mg via ORAL
  Filled 2017-07-21 (×5): qty 1

## 2017-07-21 MED ORDER — LACTATED RINGERS IV SOLN
INTRAVENOUS | Status: DC | PRN
  Administered 2017-07-21 (×2): via INTRAVENOUS

## 2017-07-21 MED ORDER — METOPROLOL SUCCINATE ER 25 MG PO TB24
ORAL_TABLET | ORAL | Status: AC
  Filled 2017-07-21: qty 2

## 2017-07-21 MED ORDER — HYDROMORPHONE HCL 1 MG/ML IJ SOLN
0.5000 mg | INTRAMUSCULAR | Status: DC | PRN
  Administered 2017-07-21 – 2017-07-22 (×2): 0.5 mg via INTRAVENOUS
  Filled 2017-07-21 (×2): qty 1

## 2017-07-21 MED ORDER — ROCURONIUM BROMIDE 10 MG/ML (PF) SYRINGE
PREFILLED_SYRINGE | INTRAVENOUS | Status: DC | PRN
  Administered 2017-07-21: 40 mg via INTRAVENOUS
  Administered 2017-07-21: 60 mg via INTRAVENOUS
  Administered 2017-07-21: 20 mg via INTRAVENOUS

## 2017-07-21 MED ORDER — SUGAMMADEX SODIUM 200 MG/2ML IV SOLN
INTRAVENOUS | Status: AC
  Filled 2017-07-21: qty 2

## 2017-07-21 MED ORDER — FENTANYL CITRATE (PF) 250 MCG/5ML IJ SOLN
INTRAMUSCULAR | Status: DC | PRN
  Administered 2017-07-21: 50 ug via INTRAVENOUS
  Administered 2017-07-21: 100 ug via INTRAVENOUS
  Administered 2017-07-21 (×2): 50 ug via INTRAVENOUS

## 2017-07-21 MED ORDER — LIDOCAINE 2% (20 MG/ML) 5 ML SYRINGE
INTRAMUSCULAR | Status: AC
  Filled 2017-07-21: qty 5

## 2017-07-21 MED ORDER — METHOCARBAMOL 1000 MG/10ML IJ SOLN
500.0000 mg | Freq: Four times a day (QID) | INTRAMUSCULAR | Status: DC | PRN
  Filled 2017-07-21: qty 5

## 2017-07-21 MED ORDER — AMLODIPINE-OLMESARTAN 5-40 MG PO TABS: 1.0000 | ORAL_TABLET | Freq: Every day | ORAL | Status: DC

## 2017-07-21 MED ORDER — PHENOL 1.4 % MT LIQD
1.0000 | OROMUCOSAL | Status: DC | PRN
  Administered 2017-07-21 – 2017-07-22 (×2): 1 via OROMUCOSAL
  Filled 2017-07-21 (×2): qty 177

## 2017-07-21 MED ORDER — FLUTICASONE PROPIONATE 50 MCG/ACT NA SUSP: 1.0000 | Freq: Every day | NASAL | Status: DC | PRN

## 2017-07-21 MED ORDER — MENTHOL 3 MG MT LOZG
1.0000 | LOZENGE | OROMUCOSAL | Status: DC | PRN
  Filled 2017-07-21 (×2): qty 9

## 2017-07-21 MED ORDER — ONDANSETRON HCL 4 MG/2ML IJ SOLN
INTRAMUSCULAR | Status: AC
  Filled 2017-07-21: qty 2

## 2017-07-21 MED ORDER — FLEET ENEMA 7-19 GM/118ML RE ENEM: 1.0000 | ENEMA | Freq: Once | RECTAL | Status: DC | PRN

## 2017-07-21 MED ORDER — 0.9 % SODIUM CHLORIDE (POUR BTL) OPTIME
TOPICAL | Status: DC | PRN
  Administered 2017-07-21: 1000 mL

## 2017-07-21 MED ORDER — PANTOPRAZOLE SODIUM 40 MG IV SOLR
40.0000 mg | Freq: Every day | INTRAVENOUS | Status: DC
  Administered 2017-07-21: 40 mg via INTRAVENOUS
  Filled 2017-07-21: qty 40

## 2017-07-21 MED ORDER — BUPIVACAINE HCL (PF) 0.5 % IJ SOLN
INTRAMUSCULAR | Status: AC
  Filled 2017-07-21: qty 30

## 2017-07-21 MED ORDER — PROPOFOL 10 MG/ML IV BOLUS
INTRAVENOUS | Status: DC | PRN
  Administered 2017-07-21: 180 mg via INTRAVENOUS

## 2017-07-21 MED ORDER — ADULT MULTIVITAMIN W/MINERALS CH
1.0000 | ORAL_TABLET | Freq: Every day | ORAL | Status: DC
  Filled 2017-07-21: qty 1

## 2017-07-21 MED ORDER — ACETAMINOPHEN 325 MG PO TABS
650.0000 mg | ORAL_TABLET | ORAL | Status: DC | PRN
  Administered 2017-07-23: 650 mg via ORAL
  Filled 2017-07-21 (×2): qty 2

## 2017-07-21 MED ORDER — ONDANSETRON HCL 4 MG/2ML IJ SOLN: 4.0000 mg | Freq: Four times a day (QID) | INTRAMUSCULAR | Status: DC | PRN

## 2017-07-21 MED ORDER — OXYCODONE HCL 5 MG PO TABS
5.0000 mg | ORAL_TABLET | Freq: Once | ORAL | Status: AC | PRN
  Administered 2017-07-21: 5 mg via ORAL

## 2017-07-21 MED ORDER — DEXAMETHASONE SODIUM PHOSPHATE 10 MG/ML IJ SOLN
INTRAMUSCULAR | Status: DC | PRN
  Administered 2017-07-21: 10 mg via INTRAVENOUS

## 2017-07-21 MED ORDER — INSULIN ASPART 100 UNIT/ML ~~LOC~~ SOLN
7.0000 [IU] | Freq: Once | SUBCUTANEOUS | Status: AC
  Administered 2017-07-21: 7 [IU] via SUBCUTANEOUS

## 2017-07-21 MED ORDER — OXYCODONE HCL 5 MG/5ML PO SOLN: 5.0000 mg | Freq: Once | ORAL | Status: AC | PRN

## 2017-07-21 MED ORDER — SIMVASTATIN 20 MG PO TABS
20.0000 mg | ORAL_TABLET | Freq: Every day | ORAL | Status: DC
  Administered 2017-07-21 – 2017-07-22 (×2): 20 mg via ORAL
  Filled 2017-07-21 (×2): qty 1

## 2017-07-21 MED ORDER — SODIUM CHLORIDE 0.9 % IV SOLN
INTRAVENOUS | Status: DC
  Administered 2017-07-21 – 2017-07-22 (×2): via INTRAVENOUS

## 2017-07-21 MED ORDER — METOPROLOL SUCCINATE ER 25 MG PO TB24
50.0000 mg | ORAL_TABLET | ORAL | Status: AC
  Administered 2017-07-21: 50 mg via ORAL

## 2017-07-21 MED ORDER — ALUM & MAG HYDROXIDE-SIMETH 200-200-20 MG/5ML PO SUSP: 30.0000 mL | Freq: Four times a day (QID) | ORAL | Status: DC | PRN

## 2017-07-21 MED ORDER — MIDAZOLAM HCL 5 MG/5ML IJ SOLN
INTRAMUSCULAR | Status: DC | PRN
  Administered 2017-07-21: 2 mg via INTRAVENOUS

## 2017-07-21 MED ORDER — ACETAMINOPHEN 650 MG RE SUPP: 650.0000 mg | RECTAL | Status: DC | PRN

## 2017-07-21 MED ORDER — PROPOFOL 10 MG/ML IV BOLUS
INTRAVENOUS | Status: AC
  Filled 2017-07-21: qty 20

## 2017-07-21 MED ORDER — HYDROMORPHONE HCL 1 MG/ML IJ SOLN
INTRAMUSCULAR | Status: AC
  Filled 2017-07-21: qty 1

## 2017-07-21 MED ORDER — VILAZODONE HCL 10 MG PO TABS
10.0000 mg | ORAL_TABLET | Freq: Every day | ORAL | Status: DC
  Administered 2017-07-21 – 2017-07-23 (×3): 10 mg via ORAL
  Filled 2017-07-21 (×3): qty 1

## 2017-07-21 MED ORDER — PANTOPRAZOLE SODIUM 40 MG PO TBEC: 40.0000 mg | DELAYED_RELEASE_TABLET | Freq: Every day | ORAL | Status: DC

## 2017-07-21 MED ORDER — ALBUTEROL SULFATE (2.5 MG/3ML) 0.083% IN NEBU: 3.0000 mL | INHALATION_SOLUTION | RESPIRATORY_TRACT | Status: DC | PRN

## 2017-07-21 MED ORDER — INSULIN ASPART 100 UNIT/ML ~~LOC~~ SOLN
0.0000 [IU] | Freq: Three times a day (TID) | SUBCUTANEOUS | Status: DC
  Administered 2017-07-21: 8 [IU] via SUBCUTANEOUS
  Administered 2017-07-22 – 2017-07-23 (×5): 5 [IU] via SUBCUTANEOUS

## 2017-07-21 MED ORDER — ONDANSETRON HCL 4 MG/2ML IJ SOLN
INTRAMUSCULAR | Status: DC | PRN
  Administered 2017-07-21: 4 mg via INTRAVENOUS

## 2017-07-21 MED ORDER — THROMBIN (RECOMBINANT) 5000 UNITS EX SOLR
CUTANEOUS | Status: AC
  Filled 2017-07-21: qty 10000

## 2017-07-21 MED ORDER — SODIUM CHLORIDE 0.9% FLUSH
3.0000 mL | Freq: Two times a day (BID) | INTRAVENOUS | Status: DC
  Administered 2017-07-21 – 2017-07-23 (×4): 3 mL via INTRAVENOUS

## 2017-07-21 MED ORDER — FENTANYL CITRATE (PF) 250 MCG/5ML IJ SOLN
INTRAMUSCULAR | Status: AC
  Filled 2017-07-21: qty 5

## 2017-07-21 MED ORDER — PHENTERMINE HCL 37.5 MG PO TABS: 37.5000 mg | ORAL_TABLET | Freq: Every day | ORAL | Status: DC

## 2017-07-21 MED ORDER — AMLODIPINE BESYLATE 5 MG PO TABS
5.0000 mg | ORAL_TABLET | Freq: Every day | ORAL | Status: DC
  Administered 2017-07-21 – 2017-07-23 (×3): 5 mg via ORAL
  Filled 2017-07-21 (×3): qty 1

## 2017-07-21 MED ORDER — ADULT MULTIVITAMIN W/MINERALS CH
1.0000 | ORAL_TABLET | Freq: Every day | ORAL | Status: DC
  Administered 2017-07-21 – 2017-07-23 (×3): 1 via ORAL
  Filled 2017-07-21 (×3): qty 1

## 2017-07-21 MED ORDER — IRBESARTAN 300 MG PO TABS
300.0000 mg | ORAL_TABLET | Freq: Every day | ORAL | Status: DC
  Administered 2017-07-21 – 2017-07-23 (×3): 300 mg via ORAL
  Filled 2017-07-21 (×3): qty 1

## 2017-07-21 MED ORDER — HYDROCODONE-ACETAMINOPHEN 7.5-325 MG PO TABS
1.0000 | ORAL_TABLET | Freq: Four times a day (QID) | ORAL | Status: DC
  Administered 2017-07-21 – 2017-07-23 (×9): 1 via ORAL
  Filled 2017-07-21 (×9): qty 1

## 2017-07-21 MED ORDER — CELECOXIB 200 MG PO CAPS
200.0000 mg | ORAL_CAPSULE | Freq: Two times a day (BID) | ORAL | Status: DC
  Administered 2017-07-21 – 2017-07-23 (×3): 200 mg via ORAL
  Filled 2017-07-21 (×4): qty 1

## 2017-07-21 MED ORDER — THROMBIN (RECOMBINANT) 20000 UNITS EX SOLR
CUTANEOUS | Status: DC | PRN
Start: 2017-07-21 — End: 2017-07-21
  Administered 2017-07-21: 10000 [IU] via TOPICAL

## 2017-07-21 MED ORDER — OXYCODONE HCL 5 MG PO TABS
ORAL_TABLET | ORAL | Status: AC
  Filled 2017-07-21: qty 1

## 2017-07-21 MED ORDER — CEFAZOLIN SODIUM-DEXTROSE 2-4 GM/100ML-% IV SOLN
2.0000 g | Freq: Three times a day (TID) | INTRAVENOUS | Status: AC
  Administered 2017-07-21 (×2): 2 g via INTRAVENOUS
  Filled 2017-07-21 (×2): qty 100

## 2017-07-21 MED ORDER — BUPIVACAINE LIPOSOME 1.3 % IJ SUSP
20.0000 mL | INTRAMUSCULAR | Status: AC
  Administered 2017-07-21: 1 mL
  Filled 2017-07-21: qty 20

## 2017-07-21 MED ORDER — PHENYLEPHRINE 40 MCG/ML (10ML) SYRINGE FOR IV PUSH (FOR BLOOD PRESSURE SUPPORT)
PREFILLED_SYRINGE | INTRAVENOUS | Status: DC | PRN
  Administered 2017-07-21 (×10): 80 ug via INTRAVENOUS

## 2017-07-21 MED ORDER — SUGAMMADEX SODIUM 200 MG/2ML IV SOLN
INTRAVENOUS | Status: DC | PRN
  Administered 2017-07-21: 200 mg via INTRAVENOUS

## 2017-07-21 MED ORDER — INSULIN LISPRO 100 UNIT/ML ~~LOC~~ SOLN: 7.0000 [IU] | Freq: Once | SUBCUTANEOUS | Status: DC

## 2017-07-21 MED ORDER — METHOCARBAMOL 500 MG PO TABS
500.0000 mg | ORAL_TABLET | Freq: Four times a day (QID) | ORAL | Status: DC | PRN
  Administered 2017-07-21 – 2017-07-23 (×6): 500 mg via ORAL
  Filled 2017-07-21 (×6): qty 1

## 2017-07-21 MED ORDER — VILAZODONE HCL 10 MG PO TABS: 10.0000 mg | ORAL_TABLET | Freq: Every day | ORAL | Status: DC

## 2017-07-21 MED ORDER — INSULIN ASPART 100 UNIT/ML ~~LOC~~ SOLN
SUBCUTANEOUS | Status: AC
  Filled 2017-07-21: qty 1

## 2017-07-21 MED ORDER — KETAMINE HCL-SODIUM CHLORIDE 100-0.9 MG/10ML-% IV SOSY
PREFILLED_SYRINGE | INTRAVENOUS | Status: AC
  Filled 2017-07-21: qty 10

## 2017-07-21 MED ORDER — LIDOCAINE 2% (20 MG/ML) 5 ML SYRINGE
INTRAMUSCULAR | Status: DC | PRN
  Administered 2017-07-21: 100 mg via INTRAVENOUS

## 2017-07-21 MED ORDER — PHENYLEPHRINE 40 MCG/ML (10ML) SYRINGE FOR IV PUSH (FOR BLOOD PRESSURE SUPPORT)
PREFILLED_SYRINGE | INTRAVENOUS | Status: AC
  Filled 2017-07-21: qty 20

## 2017-07-21 MED ORDER — HYDROMORPHONE HCL 1 MG/ML IJ SOLN
0.2500 mg | INTRAMUSCULAR | Status: DC | PRN
  Administered 2017-07-21 (×2): 0.5 mg via INTRAVENOUS

## 2017-07-21 SURGICAL SUPPLY — 59 items
BIT DRILL 14X3 FLUT 2XNS (BIT) ×1 IMPLANT
BIT DRILL 16 (BIT) ×2 IMPLANT
BIT DRL 14X3 FLUT 2XNS (BIT) ×1
BLADE CLIPPER SURG (BLADE) IMPLANT
BONE VIVIGEN FORMABLE 1.3CC (Bone Implant) ×2 IMPLANT
BUR MATCHSTICK NEURO 3.0 LAGG (BURR) IMPLANT
BUR RND FLUTED 2.5 (BURR) IMPLANT
BUR SABER RD CUTTING 3.0 (BURR) ×2 IMPLANT
CLIP TI MEDIUM 6 (CLIP) ×2 IMPLANT
COLLAR CERV LO CONTOUR FIRM DE (SOFTGOODS) ×2 IMPLANT
COVER SURGICAL LIGHT HANDLE (MISCELLANEOUS) ×2 IMPLANT
DERMABOND ADVANCED (GAUZE/BANDAGES/DRESSINGS) ×1
DERMABOND ADVANCED .7 DNX12 (GAUZE/BANDAGES/DRESSINGS) ×1 IMPLANT
DRAIN TLS ROUND 10FR (DRAIN) IMPLANT
DRAPE C-ARM 42X72 X-RAY (DRAPES) ×2 IMPLANT
DRAPE MICROSCOPE LEICA (MISCELLANEOUS) ×2 IMPLANT
DRAPE SURG 17X23 STRL (DRAPES) ×18 IMPLANT
DRILL BIT 14MM (BIT) ×1
DRSG MEPILEX BORDER 4X4 (GAUZE/BANDAGES/DRESSINGS) ×2 IMPLANT
DURAPREP 6ML APPLICATOR 50/CS (WOUND CARE) ×2 IMPLANT
ELECT BLADE 4.0 EZ CLEAN MEGAD (MISCELLANEOUS)
ELECT COATED BLADE 2.86 ST (ELECTRODE) ×2 IMPLANT
ELECT REM PT RETURN 9FT ADLT (ELECTROSURGICAL) ×2
ELECTRODE BLDE 4.0 EZ CLN MEGD (MISCELLANEOUS) IMPLANT
ELECTRODE REM PT RTRN 9FT ADLT (ELECTROSURGICAL) ×1 IMPLANT
GLOVE BIOGEL PI IND STRL 8 (GLOVE) ×1 IMPLANT
GLOVE BIOGEL PI INDICATOR 8 (GLOVE) ×1
GLOVE ECLIPSE 9.0 STRL (GLOVE) ×2 IMPLANT
GLOVE ORTHO TXT STRL SZ7.5 (GLOVE) ×2 IMPLANT
GLOVE SURG 8.5 LATEX PF (GLOVE) ×2 IMPLANT
GOWN STRL REUS W/ TWL LRG LVL3 (GOWN DISPOSABLE) ×1 IMPLANT
GOWN STRL REUS W/TWL 2XL LVL3 (GOWN DISPOSABLE) ×4 IMPLANT
GOWN STRL REUS W/TWL LRG LVL3 (GOWN DISPOSABLE) ×1
HEAD HALTER (SOFTGOODS) ×2 IMPLANT
IMPL ZERO-P LORDOTIC 10MM (Neuro Prosthesis/Implant) ×1 IMPLANT
IMPLANT ZERO-P LORDOTIC 10MM (Neuro Prosthesis/Implant) ×2 IMPLANT
KIT BASIN OR (CUSTOM PROCEDURE TRAY) ×2 IMPLANT
KIT ROOM TURNOVER OR (KITS) ×2 IMPLANT
MANIFOLD NEPTUNE II (INSTRUMENTS) ×2 IMPLANT
NEEDLE SPNL 20GX3.5 QUINCKE YW (NEEDLE) ×4 IMPLANT
NS IRRIG 1000ML POUR BTL (IV SOLUTION) ×2 IMPLANT
PACK ORTHO CERVICAL (CUSTOM PROCEDURE TRAY) ×2 IMPLANT
PAD ARMBOARD 7.5X6 YLW CONV (MISCELLANEOUS) ×4 IMPLANT
PATTIES SURGICAL .5 X.5 (GAUZE/BANDAGES/DRESSINGS) IMPLANT
PIN DISTRACTION 14 (PIN) ×4 IMPLANT
PIN DISTRACTION 14MM (PIN) ×4 IMPLANT
SCREW SELF DRILLING 14MM (Screw) ×4 IMPLANT
SPONGE LAP 4X18 X RAY DECT (DISPOSABLE) IMPLANT
SPONGE SURGIFOAM ABS GEL 100 (HEMOSTASIS) IMPLANT
SUT VIC AB 2-0 CT1 36 (SUTURE) ×2 IMPLANT
SUT VIC AB 3-0 X1 27 (SUTURE) IMPLANT
SUT VIC AB 4-0 PS2 18 (SUTURE) ×2 IMPLANT
SUT VICRYL 4-0 PS2 18IN ABS (SUTURE) ×2 IMPLANT
SYR 20CC LL (SYRINGE) ×2 IMPLANT
SYSTEM CHEST DRAIN TLS 7FR (DRAIN) IMPLANT
TOWEL OR 17X24 6PK STRL BLUE (TOWEL DISPOSABLE) ×2 IMPLANT
TOWEL OR 17X26 10 PK STRL BLUE (TOWEL DISPOSABLE) ×2 IMPLANT
TRAY FOLEY W/METER SILVER 16FR (SET/KITS/TRAYS/PACK) IMPLANT
WATER STERILE IRR 1000ML POUR (IV SOLUTION) ×2 IMPLANT

## 2017-07-21 NOTE — Op Note (Signed)
07/21/2017  10:09 AM  PATIENT:  [name]  51 y.o. female  MRN: [id number]  OPERATIVE REPORT  PRE-OPERATIVE DIAGNOSIS:  herniated disc C6-7 central and right, fusion C5-6 with retained plate  POST-OPERATIVE DIAGNOSIS:  herniated disc C6-7 central and right, fusion C5-6 with retained plate  PROCEDURE:  Procedure(s): ANTERIOR CERVICAL DISCECTOMY AND FUSION C6-7, REMOVAL OF SCREW C6 PLATE, DEPUY ZERO-P IMPLANT, LOCAL BONE GRAFT, ALLOGRAFT BONE GRAFT, VIVIGEN    SURGEON:  Jessy Oto, MD     ASSISTANTLarkin Ina, CRNFA  (Present throughout the entire procedure and necessary for completion of procedure in a timely manner)     ANESTHESIA:  General, supplemented with local marcaine 0.5% total 69GE    COMPLICATIONS:  None.     COMPONENTS:   Implant Name Type Inv. Item Serial No. Manufacturer Lot No. LRB No. Used  BONE VIVIGEN FORMABLE 1.3CC - [id number] Bone Implant BONE VIVIGEN FORMABLE 1.3CC [id number] [employer] [facility]   1  IMPLANT ZERO-P LORDOTIC 10MM - [id number] Neuro Prosthesis/Implant IMPLANT ZERO-P LORDOTIC 10MM  [employer] [id number]  1  SCREW SELF DRILLING 14MM - [id number] Screw SCREW SELF DRILLING 14MM  [employer]   2     PROCEDURE:The patient was met in the holding area, and the appropriate left C6-7 cervical level identified and marked with an "x" and my initials. The patient was then transported to OR and was placed on the operative table in a supine position head supported on the well padded [place] horseshoe. The patient was then placed under general anesthesia without difficulty intubated atraumaticly.  Cervical spine was positioned with a [place] horseshoe and 5 pound cervical halter traction. All pressure points well padded and semi-beach chair position. Arm metal sleds both arms. Bump under the the right buttock.Standard prep with DuraPrep solution the anterior cervical spine and upper left chest and the right iliac crest. Draped in the usual  manner. Iodine vi drape was used both areas.. Standard timeout protocol was carried out identifying the patient procedure side of the procedure and level.The skin the left neck was infiltrated with Marcaine with epinephrine total of 10 cc.This at the level of expected C6-7 incision and also along a skin crease in line with the patients lines of Langer and the old incision scar.  .  And Incision transverse at the C6 level and carried down to the level of the platysma. Then was carried down to the anterior aspect of the sternocleidomastoid muscle. The interval between the trachea and esophagus medially and the carotid sheath laterally was developed as a Metzenbaum scissors and blunt dissection exposing the anterior aspect of cervical spine at the C6-7 level. A inferior thyroid artery noted across the field, this was suture tied mediall and a medium clip used to hemostas the lateral part of the artery. The prevertebral fascia anterior to the cervical was cauterized with bipolar and teased across the midline with a Art therapist. The inferior aspect of the C5-6 plate at the C6 level identified and spurs removed off the anterior aspect of the C6-7. An 18-gauge spinal needle was placed with sheath intact allowing only a centimeter to extend into the C6-7 disc and observed on lateral C-arm draped sterilely at the C6-7 level. Handheld Cloward retraction of the soft tissues while identifying the level at C6-7 and also while removing a portion of the anterior aspect of the disc with15 blade scalpel and pituitary. Medial border of the longus collie muscles was carefully elevated bilaterally and self-retaining Boss retractors  were introduced the foot of the blade beneath the medial border of the longus colli muscles. Soft tissue overlying the anterior borders of the disc space at C6-7 level carefully debridement of soft tissue back to bony edges. The anterior lip osteophytes were then resected using rongeur. This bone was  preserved for later bone grafting purposes. The disc space was then first prepared using loupe magnification and headlight with resection of degenerative disc annulus anteriorly and posteriorly and cartilaginous endplates using micro-curettes pituitaries and a high-speed bur. The operating room microscope was draped sterilely and brought into the field. Under the operating room microscope and posterior aspect of the disc was excised using micro-curettes pituitary rongeur and times per. Posterior lip osteophytes were drilled using a high-speed bur and a carefully resected with 1 and 2 mm Kerrison foraminotomy performed over both C7 nerve roots using 1 and 2 mm Kerrisons disc herniation material was noted centrally and extending posterior to the vertebral body of C7. Fine titanium nerve hooks used to remove the disc materior that had extruded centrally and over the posterior aspect of C7. Following decompression of the spinal cord and both C7 nerve roots, irrigation was carried out. The endplates of the inferior aspect of C6 and superior aspect of C7 were carefully prepared using a high-speed bur to parallel. A screw post spreader as well as cervical traction using head halter as well as a traction on the patient's chin allowed for distraction of the disc space both when debriding and also when placing trial implants. The depth of the disc space measured using the Cloward depth gauge measured at 14 mm. The disc space was then sounded utilized and a precontoured sounder for the Zero-P implant. Surrounding was carried up to a 10 mm implant. 10 mm Zero-P spacer was felt to provide best fit for the C6-7 disc space. A right sided screw at the inferior right C6 level was removed by  unscrewed the screw from the right Lower aspect of the cervical plate with a small fragment screw driver. Cervical distraction using distraction screw post placed into the vertebral body of C7 and right open screw hole after removal of screw  from the right lower cervical plate allowed for enough distraction to place the Zero-P. implant. C arm flouro used to confirm the position and alignment. The permanent 10 mm lordotic Zero-P implant was then brought onto the field. It was then packed with local bone graft that had been harvested previously from anterior cervical osteophytes and with formable vivigen allograft. The implant was then carefully placed over the intervertebral disc space at C6-7 level. Care taken to ensure that no bone or soft tissue debris within the disc space that could be retropulsed with insertion of the cage and bone graft. The implant was then impacted into place with the head placed in longitudinal cervical traction. The implant was felt to be in excellent position alignment. Examined on C-arm fluoroscopy to be well positioned. No sign of retropulsion of the implant. A flange over the anterior left side of the plate device did impinge on the anterior upper surface of the retained cervical plate. This plate was carefully positioned and then able to be impacted seating the flange just above the superior aspect of the plate. The 14 mm drill was then introduced on the left-hand side of the plate directed downward into the vertebral body of C7. An drill was able to be fully seated and then a 14 mm screw introduced into the left hand opening  and draped downwards and then this was screwed into place. Cervical traction was then fully released. And the screw on the right-hand side of the Zero-p implant.first using the 61m drill seating the drill The screw post traction was removed prior to drilling and seating the right screw.  Then using the self drilling 14 mm screw was placed without difficulty. C-arm fluoroscopy demonstrated the screws in good position alignment both AP and lateral planes. Permanent C-arm images were For documentation purposes. Cervical distraction instrumentation was removed and the C7 screw post hole coated with wax  for hemostasis.Intraoperative lateral and AP C-arm demonstrated the Zero-P implant and screws in good position alignment no sign of impingement upon the cervical canal. At this point irrigation was carried out at cervical incision site.The esophagus examined at the cervical level and found to be normal. Irrigation was again carried out there was no active bleeding present. The incisions were then closed by approximating the deep subcutaneous layers the platysma layer with interrupted 3-0 Vicryl suture and the superficial fascia overlying the sternocleidomastoid muscle with interrupted 3-0 Vicryl sutures. The subcutaneous layers were approximated with interrupted 3-0 Vicryl sutures as were the superficial layers. The skin was closed with a running subcutaneous stitch of 4-0 Vicryl at the operative C6-7 transverse incision site Dermabond and MedPlex bandage applied.Skin was approximated with Dermabond. Mepilex bandage was applied. A philadelphia cervical collar was then applied to the cervical spine. The patient was then reactivated and extubated and returned to the recovery room in satisfactory condition. All instrument and sponge counts were correct. Foley catheter in and out at the end of the procedure.  JLarkin Ina CRNFA perform the duties of aPensions consultantduring this case. He was present from the beginning of the case to the end of the case assisting in transfer the patient from his stretcher to the OR table and back to the stretcher at the end of the case. Assisted in careful retraction and suction of the anterior cervical discectomy site and delicate neural structures operating under the operating room microscope. He performed closure of the incision from the fascia to the skin applying the dressing.         JBasil Dess 07/21/2017, 10:09 AM

## 2017-07-21 NOTE — Anesthesia Procedure Notes (Signed)
Procedure Name: Intubation Date/Time: 07/21/2017 7:41 AM Performed by: Freddie Breech, CRNA Pre-anesthesia Checklist: Patient identified, Emergency Drugs available, Suction available and Patient being monitored Patient Re-evaluated:Patient Re-evaluated prior to induction Oxygen Delivery Method: Circle System Utilized Preoxygenation: Pre-oxygenation with 100% oxygen Induction Type: IV induction Ventilation: Mask ventilation without difficulty Laryngoscope Size: Glidescope and 4 Grade View: Grade I Tube type: Oral Tube size: 7.0 mm Number of attempts: 1 Airway Equipment and Method: Stylet and Oral airway Placement Confirmation: positive ETCO2 and breath sounds checked- equal and bilateral (ETT placement confirmed with video laryngoscopy. ) Secured at: 21 cm Tube secured with: Tape Dental Injury: Teeth and Oropharynx as per pre-operative assessment

## 2017-07-21 NOTE — Interval H&P Note (Signed)
History and Physical Interval Note:  07/21/2017 7:18 AM  [name]  has presented today for surgery, with the diagnosis of herniated disc C6-7 central and right, fusion C5-6 with retained plate  The various methods of treatment have been discussed with the patient and family. After consideration of risks, benefits and other options for treatment, the patient has consented to  Procedure(s): ANTERIOR CERVICAL DISCECTOMY AND FUSION C6-7, REMOVAL OF SCREW C6 PLATE, DEPUY ZERO-P IMPLANT, LOCAL BONE GRAFT, ALLOGRAFT BONE GRAFT, VIVIGEN (N/A) as a surgical intervention .  The patient's history has been reviewed, patient examined, no change in status, stable for surgery.  I have reviewed the patient's chart and labs.  Questions were answered to the patient's satisfaction.     Basil Dess

## 2017-07-21 NOTE — Anesthesia Postprocedure Evaluation (Signed)
Anesthesia Post Note  Patient: CARIANN KINNAMON  Procedure(s) Performed: ANTERIOR CERVICAL DISCECTOMY AND FUSION C6-7, REMOVAL OF SCREW C6 PLATE, DEPUY ZERO-P IMPLANT, LOCAL BONE GRAFT, ALLOGRAFT BONE GRAFT, VIVIGEN (N/A )     Patient location during evaluation: PACU Anesthesia Type: General Level of consciousness: awake and sedated Pain management: pain level controlled Vital Signs Assessment: post-procedure vital signs reviewed and stable Respiratory status: spontaneous breathing, nonlabored ventilation, respiratory function stable and patient connected to nasal cannula oxygen Cardiovascular status: blood pressure returned to baseline and stable Postop Assessment: no apparent nausea or vomiting Anesthetic complications: no    Last Vitals:  Vitals:   07/21/17 1200 07/21/17 1215  BP: 128/71 127/78  Pulse: 92 91  Resp: 12 12  Temp:  36.5 C  SpO2: 97% 98%    Last Pain:  Vitals:   07/21/17 1215  TempSrc:   PainSc: Asleep                 Ronella Plunk,JAMES TERRILL

## 2017-07-21 NOTE — Evaluation (Signed)
Physical Therapy Evaluation Patient Details Name: [name] MRN: [id number] DOB: 09-23-1965 Today's Date: 07/21/2017   History of Present Illness  Pt is a 51 y/o female s/p C6-7 ACDF. PMH includes RA, Depression, DM, fibromyalgia, HTN, and back surgery.   Clinical Impression  Pt s/p surgery above with deficits below. PTA, pt was independent with functional mobility. Upon eval, pt very limited by pain and very tearful throughout session. Pt also presenting with slight unsteadiness during gait. Required min to min guard assist. Educated about use of RW at home to increase stability and pt agreeable. Reports family can call to check in on her, but unsure if anyone will be able to stay with her. Feel pt would benefit from HHPT to increase safety at home. Will continue to follow acutely to maximize functional mobility independence and safety.     Follow Up Recommendations Home health PT;Supervision for mobility/OOB    Equipment Recommendations  3in1 (PT);Rolling walker with 5" wheels    Recommendations for Other Services       Precautions / Restrictions Precautions Precautions: Cervical Precaution Comments: Reviewed cervical precautions with pt.  Required Braces or Orthoses: Cervical Brace Cervical Brace: Soft collar Restrictions Weight Bearing Restrictions: No      Mobility  Bed Mobility Overal bed mobility: Needs Assistance Bed Mobility: Rolling;Sidelying to Sit;Sit to Sidelying Rolling: Supervision Sidelying to sit: Min assist     Sit to sidelying: Min assist General bed mobility comments: Min A for trunk elevation and for LE lift assist back to sidelying. Pt required extended time and was very tearful throughout secondary to pain.   Transfers Overall transfer level: Needs assistance Equipment used: None Transfers: Sit to/from Stand Sit to Stand: Min guard         General transfer comment: Min guard for safety. Verbal cues to power through LEs.    Ambulation/Gait Ambulation/Gait assistance: Min guard;Min assist Ambulation Distance (Feet): 15 Feet Assistive device: (IV pole) Gait Pattern/deviations: Step-through pattern;Decreased stride length Gait velocity: Decreased Gait velocity interpretation: Below normal speed for age/gender General Gait Details: Pt very guarded and slightly unsteady. Very slow gait and tearful throughout secondary to pain. Educated about use of RW to increase stability and pt agreeable.   Stairs            Wheelchair Mobility    Modified Rankin (Stroke Patients Only)       Balance Overall balance assessment: Needs assistance Sitting-balance support: No upper extremity supported;Feet supported Sitting balance-Leahy Scale: Good     Standing balance support: No upper extremity supported;Single extremity supported;During functional activity Standing balance-Leahy Scale: Fair Standing balance comment: Able to maintain static standing without UE support.                              Pertinent Vitals/Pain Pain Assessment: 0-10 Pain Score: 8  Pain Location: neck  Pain Descriptors / Indicators: Aching;Constant;Grimacing Pain Intervention(s): Limited activity within patient's tolerance;Monitored during session;Repositioned    Home Living Family/patient expects to be discharged to:: Private residence Living Arrangements: Alone Available Help at Discharge: Family;Friend(s);Available PRN/intermittently Type of Home: Apartment Home Access: Stairs to enter Entrance Stairs-Rails: Left Entrance Stairs-Number of Steps: 6-8 Home Layout: One level Home Equipment: Cane - single point      Prior Function Level of Independence: Independent               Hand Dominance   Dominant Hand: Right    Extremity/Trunk Assessment  Upper Extremity Assessment Upper Extremity Assessment: Defer to OT evaluation    Lower Extremity Assessment Lower Extremity Assessment: Generalized  weakness    Cervical / Trunk Assessment Cervical / Trunk Assessment: Other exceptions Cervical / Trunk Exceptions: s/p ACDF  Communication   Communication: No difficulties  Cognition Arousal/Alertness: Awake/alert Behavior During Therapy: WFL for tasks assessed/performed Overall Cognitive Status: Within Functional Limits for tasks assessed                                        General Comments General comments (skin integrity, edema, etc.): Educated about equipment recommendations. Pt very tearful throughout session and states she is unsure if anyone could come stay with her when asked if anyone could stay with her upon d/c.     Exercises     Assessment/Plan    PT Assessment Patient needs continued PT services  PT Problem List Decreased strength;Decreased balance;Decreased activity tolerance;Decreased mobility;Decreased knowledge of use of DME;Decreased knowledge of precautions;Pain       PT Treatment Interventions DME instruction;Gait training;Stair training;Functional mobility training;Therapeutic activities;Therapeutic exercise;Balance training;Neuromuscular re-education;Patient/family education    PT Goals (Current goals can be found in the Care Plan section)  Acute Rehab PT Goals Patient Stated Goal: to decrease pain  PT Goal Formulation: With patient Time For Goal Achievement: 07/28/17 Potential to Achieve Goals: Good    Frequency Min 5X/week   Barriers to discharge Decreased caregiver support Lives alone     Co-evaluation               AM-PAC PT "6 Clicks" Daily Activity  Outcome Measure Difficulty turning over in bed (including adjusting bedclothes, sheets and blankets)?: A Little Difficulty moving from lying on back to sitting on the side of the bed? : Unable Difficulty sitting down on and standing up from a chair with arms (e.g., wheelchair, bedside commode, etc,.)?: Unable Help needed moving to and from a bed to chair (including a  wheelchair)?: A Little Help needed walking in hospital room?: A Little Help needed climbing 3-5 steps with a railing? : A Lot 6 Click Score: 13    End of Session Equipment Utilized During Treatment: Gait belt;Cervical collar Activity Tolerance: Patient limited by pain Patient left: in bed;with call bell/phone within reach Nurse Communication: Mobility status PT Visit Diagnosis: Other abnormalities of gait and mobility (R26.89);Pain Pain - part of body: (neck )    Time: 0177-9390 PT Time Calculation (min) (ACUTE ONLY): 25 min   Charges:   PT Evaluation $PT Eval Moderate Complexity: 1 Mod PT Treatments $Gait Training: 8-22 mins   PT G Codes:   PT G-Codes **NOT FOR INPATIENT CLASS** Functional Assessment Tool Used: AM-PAC 6 Clicks Basic Mobility;Clinical judgement Functional Limitation: Mobility: Walking and moving around Mobility: Walking and Moving Around Current Status (Z0092): At least 40 percent but less than 60 percent impaired, limited or restricted Mobility: Walking and Moving Around Goal Status [phone]): At least 1 percent but less than 20 percent impaired, limited or restricted    Leighton Ruff, PT, DPT  Acute Rehabilitation Services  Pager: [place] 07/21/2017, 4:36 PM

## 2017-07-21 NOTE — Transfer of Care (Signed)
Immediate Anesthesia Transfer of Care Note  [name]  Procedure(s) Performed: ANTERIOR CERVICAL DISCECTOMY AND FUSION C6-7, REMOVAL OF SCREW C6 PLATE, DEPUY ZERO-P IMPLANT, LOCAL BONE GRAFT, ALLOGRAFT BONE GRAFT, VIVIGEN (N/A )  Patient Location: PACU  Anesthesia Type:General  Level of Consciousness: drowsy and patient cooperative  Airway & Oxygen Therapy: Patient Spontanous Breathing and Patient connected to face mask oxygen  Post-op Assessment: Report given to RN and Post -op Vital signs reviewed and stable  Post vital signs: Reviewed and stable  Last Vitals:  Vitals:   07/21/17 0604 07/21/17 1020  BP: (!) 177/76   Pulse: 94   Resp: 20   Temp: 36.9 C (P) 36.7 C  SpO2: 100%     Last Pain:  Vitals:   07/21/17 1020  TempSrc:   PainSc: (P) 0-No pain      Patients Stated Pain Goal: 4 (25/36/64 4034)  Complications: No apparent anesthesia complications

## 2017-07-21 NOTE — Discharge Instructions (Signed)
No lifting greater than 10 lbs. No overhead use of arms. °Avoid bending,and twisting neck. °Walk in house for first week them may start to get out slowly increasing distance up to one mile by 3 weeks post op. °Keep incision dry for 3 days, may then bathe and wet incision using a Philadelphia collar when showering. °Call if any fevers >101, chills, or increasing numbness or weakness or increased swelling or drainage. ° °

## 2017-07-21 NOTE — Brief Op Note (Signed)
07/21/2017  10:07 AM  PATIENT:  [name]  51 y.o. female  PRE-OPERATIVE DIAGNOSIS:  herniated disc C6-7 central and right, fusion C5-6 with retained plate  POST-OPERATIVE DIAGNOSIS:  herniated disc C6-7 central and right, fusion C5-6 with retained plate  PROCEDURE:  Procedure(s): ANTERIOR CERVICAL DISCECTOMY AND FUSION C6-7, REMOVAL OF SCREW C6 PLATE, DEPUY ZERO-P IMPLANT, LOCAL BONE GRAFT, ALLOGRAFT BONE GRAFT, VIVIGEN (N/A)  SURGEON:  Surgeon(s) and Role:    * Jessy Oto, MD - Primary  ASSISTANT: Justin, CRNFA  ANESTHESIA:   local and general  EBL:  25 mL   BLOOD ADMINISTERED:none  DRAINS: none   LOCAL MEDICATIONS USED:  MARCAINE    and Amount: 10 ml  SPECIMEN:  No Specimen  DISPOSITION OF SPECIMEN:  N/A  COUNTS:  YES  TOURNIQUET:  * No tourniquets in log *  DICTATION: .Dragon Dictation  PLAN OF CARE: Admit for overnight observation  PATIENT DISPOSITION:  PACU - hemodynamically stable.   Delay start of Pharmacological VTE agent (>24hrs) due to surgical blood loss or risk of bleeding: yes

## 2017-07-21 NOTE — Progress Notes (Signed)
Dr massage notifed of pts BS=[id number] orders obtained and carried out

## 2017-07-22 ENCOUNTER — Encounter (HOSPITAL_COMMUNITY): Payer: Self-pay | Admitting: General Practice

## 2017-07-22 ENCOUNTER — Other Ambulatory Visit: Payer: Self-pay

## 2017-07-22 DIAGNOSIS — M75121 Complete rotator cuff tear or rupture of right shoulder, not specified as traumatic: Secondary | ICD-10-CM | POA: Insufficient documentation

## 2017-07-22 DIAGNOSIS — Z969 Presence of functional implant, unspecified: Secondary | ICD-10-CM | POA: Insufficient documentation

## 2017-07-22 DIAGNOSIS — M50123 Cervical disc disorder at C6-C7 level with radiculopathy: Secondary | ICD-10-CM | POA: Diagnosis not present

## 2017-07-22 LAB — GLUCOSE, CAPILLARY
GLUCOSE-CAPILLARY: 248 mg/dL — AB (ref 65–99)
GLUCOSE-CAPILLARY: 216 mg/dL — AB (ref 65–99)
Glucose-Capillary: 239 mg/dL — ABNORMAL HIGH (ref 65–99)
Glucose-Capillary: 267 mg/dL — ABNORMAL HIGH (ref 65–99)

## 2017-07-22 LAB — SURGICAL PCR SCREEN
MRSA, PCR: NEGATIVE
Staphylococcus aureus: NEGATIVE

## 2017-07-22 MED ORDER — PANTOPRAZOLE SODIUM 40 MG PO TBEC
40.0000 mg | DELAYED_RELEASE_TABLET | Freq: Every day | ORAL | Status: DC
  Administered 2017-07-22: 40 mg via ORAL
  Filled 2017-07-22: qty 1

## 2017-07-22 NOTE — Progress Notes (Signed)
     Subjective: 1 Day Post-Op Procedure(s) (LRB): ANTERIOR CERVICAL DISCECTOMY AND FUSION C6-7, REMOVAL OF SCREW C6 PLATE, DEPUY ZERO-P IMPLANT, LOCAL BONE GRAFT, ALLOGRAFT BONE GRAFT, VIVIGEN (N/A) Awake, alert and oriented x 4. Requests a foam wedge for bed at discharge. Would assist in mobilizing patient to standing and walking and decrease swelling with HOB elevated. Her MRI right shoulder 11/15 shows a right shoulder large 41mm across rotator cuff tear. There is also retraction of the tear by 2 cm. No chills. Pain in the left shoulder is improved post op.   Patient reports pain as moderate.    Objective:   VITALS:  Temp:  [97.8 F (36.6 C)-98.4 F (36.9 C)] 98.4 F (36.9 C) (12/11 0647) Pulse Rate:  [81-95] 81 (12/11 1032) Resp:  [12-18] 17 (12/11 0647) BP: (138-149)/(69-88) 138/69 (12/11 1032) SpO2:  [93 %-97 %] 97 % (12/11 0647)  Neurologically intact ABD soft Neurovascular intact Sensation intact distally Intact pulses distally Dorsiflexion/Plantar flexion intact Incision: dressing C/D/I and no drainage No cellulitis present   LABS Recent Labs    07/21/17 0541  HGB 15.5*  WBC 10.7*  PLT 308   Recent Labs    07/21/17 0541  NA 137  K 4.0  CL 103  CO2 21*  BUN 15  CREATININE 0.59  GLUCOSE 224*   Recent Labs    07/21/17 0541  INR 1.07  Weak right shoulder abduction and external rotation.   Assessment/Plan: 1 Day Post-Op Procedure(s) (LRB): ANTERIOR CERVICAL DISCECTOMY AND FUSION C6-7, REMOVAL OF SCREW C6 PLATE, DEPUY ZERO-P IMPLANT, LOCAL BONE GRAFT, ALLOGRAFT BONE GRAFT, VIVIGEN (N/A) Right rotator cuff tear.  Advance diet Up with therapy D/C IV fluids Plan for discharge tomorrow with HHN follow up. Will try for a rental hospital bed for home for period post op and To assist with mobilizing as she will have difficulty with transition Bed to standing and walking.   Basil Dess 07/22/2017, 12:33 PMPatient ID: [name], female   DOB:  12-May-1966, 51 y.o.   MRN: [id number]

## 2017-07-22 NOTE — Progress Notes (Signed)
Physical Therapy Treatment Patient Details Name: [name] MRN: [id number] DOB: 1965-09-24 Today's Date: 07/22/2017    History of Present Illness Pt is a 51 y/o female s/p C6-7 ACDF. PMH includes RA, Depression, DM, fibromyalgia, HTN, and back surgery.     PT Comments    Patient received sitting up in chair, pleasant and willing to work with skilled PT services this morning but remaining very anxious about return home on her own. Discussed and reviewed cervical precautions as well as equipment that has been recommended for her to assist with return home, visual demonstration of use of shower bench today as well. Able to ambulate in room with walker, noting much improved gait mechanics and balance with use of assistive device. Discussed and practiced correct bed mechanics with HOB elevated to mimic wedge that patient reports she will be using at home in bed; she continues to require min-mod verbal cues for correct mechanics as well as verbal encouragement during bed level transfers. RN aware regarding beeping/occluded IV, RN also reports she has just ordered Aspen collar for patient. Patient left sitting up in chair with all needs met, all questions/concerns addressed, and family present.     Follow Up Recommendations  Home health PT;Supervision for mobility/OOB     Equipment Recommendations  3in1 (PT);Rolling walker with 5" wheels    Recommendations for Other Services       Precautions / Restrictions Precautions Precautions: Cervical Precaution Comments: Reviewed cervical precautions with pt.  Required Braces or Orthoses: Cervical Brace Cervical Brace: Soft collar Restrictions Weight Bearing Restrictions: No    Mobility  Bed Mobility Overal bed mobility: Needs Assistance Bed Mobility: Rolling;Sidelying to Sit;Sit to Sidelying Rolling: Supervision Sidelying to sit: Min guard     Sit to sidelying: Min guard General bed mobility comments: reviewed all bed mobility today  with HOB elevated to mimic wedge patient reports she will have at home; min guard and min-mod verbal cues provided, patient remains anxious with activity   Transfers Overall transfer level: Needs assistance Equipment used: Rolling walker (2 wheeled) Transfers: Sit to/from Stand Sit to Stand: Supervision            Ambulation/Gait Ambulation/Gait assistance: Supervision Ambulation Distance (Feet): 15 Feet Assistive device: Rolling walker (2 wheeled) Gait Pattern/deviations: WFL(Within Functional Limits)     General Gait Details: mobility improved with rolling walker, gait mechanics much improved    Stairs            Wheelchair Mobility    Modified Rankin (Stroke Patients Only)       Balance Overall balance assessment: Needs assistance Sitting-balance support: Bilateral upper extremity supported;Feet supported Sitting balance-Leahy Scale: Good     Standing balance support: Bilateral upper extremity supported Standing balance-Leahy Scale: Good                              Cognition Arousal/Alertness: Awake/alert Behavior During Therapy: WFL for tasks assessed/performed Overall Cognitive Status: Within Functional Limits for tasks assessed                                        Exercises      General Comments General comments (skin integrity, edema, etc.): patient remains very anxious regarding going home on her own but reports this session was very helpful in increasing her confidence       Pertinent  Vitals/Pain Pain Assessment: 0-10 Pain Score: 6  Pain Location: neck  Pain Descriptors / Indicators: Aching;Constant;Grimacing Pain Intervention(s): Limited activity within patient's tolerance;Monitored during session    Home Living Family/patient expects to be discharged to:: Private residence Living Arrangements: Alone                  Prior Function            PT Goals (current goals can now be found in the  care plan section) Acute Rehab PT Goals Patient Stated Goal: to decrease pain  PT Goal Formulation: With patient Time For Goal Achievement: 07/28/17 Potential to Achieve Goals: Good Progress towards PT goals: Progressing toward goals    Frequency    Min 5X/week      PT Plan Current plan remains appropriate    Co-evaluation              AM-PAC PT "6 Clicks" Daily Activity  Outcome Measure  Difficulty turning over in bed (including adjusting bedclothes, sheets and blankets)?: Unable Difficulty moving from lying on back to sitting on the side of the bed? : Unable Difficulty sitting down on and standing up from a chair with arms (e.g., wheelchair, bedside commode, etc,.)?: Unable Help needed moving to and from a bed to chair (including a wheelchair)?: A Little Help needed walking in hospital room?: A Little Help needed climbing 3-5 steps with a railing? : A Lot 6 Click Score: 11    End of Session Equipment Utilized During Treatment: Cervical collar Activity Tolerance: Patient tolerated treatment well Patient left: in chair;with call bell/phone within reach;with family/visitor present Nurse Communication: Other (comment)(IV occluded/beeping; discussed soft collar versus aspen collar, RN reports she has already ordered aspen collar for patient ) PT Visit Diagnosis: Other abnormalities of gait and mobility (R26.89);Pain Pain - part of body: (neck )     Time: 1100-1131 PT Time Calculation (min) (ACUTE ONLY): 31 min  Charges:  $Therapeutic Activity: 8-22 mins $Self Care/Home Management: 8-22                    G Codes:       Deniece Ree PT, DPT, CBIS  Supplemental Physical Therapist [place]

## 2017-07-22 NOTE — Evaluation (Signed)
Occupational Therapy Evaluation Patient Details Name: [name] MRN: [id number] DOB: 05-21-1966 Today's Date: 07/22/2017    History of Present Illness Pt is a 51 y/o female s/p C6-7 ACDF. PMH includes RA, Depression, DM, fibromyalgia, HTN, and back surgery.    Clinical Impression   PT admitted with ACDF C6-7. Pt currently with functional limitiations due to the deficits listed below (see OT problem list). Pt currently with acute R shoulder pain and rotator cuff tear that is also limiting for adls. Pt is able to cross and reach R LE but needs assistance for L LE. OT to address bathing/ tub transfer next session. Pt positioned supine and reports decr pain and increased comfort with pillows / heated blankets.  Pt will benefit from skilled OT to increase their independence and safety with adls and balance to allow discharge hhot./ tub bench/ 3n1/ rw.     Follow Up Recommendations  Home health OT    Equipment Recommendations  3 in 1 bedside commode;Tub/shower bench;Other (comment)    Recommendations for Other Services       Precautions / Restrictions Precautions Precautions: Cervical Precaution Comments: Reviewed cervical precautions with pt.  Required Braces or Orthoses: Cervical Brace Cervical Brace: Soft collar      Mobility Bed Mobility Overal bed mobility: Needs Assistance Bed Mobility: Rolling;Sit to Supine;Supine to Sit Rolling: Supervision   Supine to sit: Supervision Sit to supine: Supervision   General bed mobility comments: incr time, use of bed rail and education on sequence. pt educated that even though hospital bed ordered that it may not have rails up if the rails are the entire length of the bed  Transfers Overall transfer level: Needs assistance Equipment used: Rolling walker (2 wheeled) Transfers: Sit to/from Stand Sit to Stand: Min guard              Balance                                           ADL either performed or  assessed with clinical judgement   ADL Overall ADL's : Needs assistance/impaired Eating/Feeding: Supervision/ safety   Grooming: Supervision/safety   Upper Body Bathing: Moderate assistance   Lower Body Bathing: Moderate assistance           Toilet Transfer: Minimal assistance;RW;BSC             General ADL Comments: pt able to cross R LE but unable to cross L LE fully. will need education on reacher use     Vision Patient Visual Report: No change from baseline       Perception     Praxis      Pertinent Vitals/Pain Pain Assessment: 0-10 Pain Score: 6  Pain Location: neck Pain Descriptors / Indicators: Grimacing;Aching Pain Intervention(s): Monitored during session;Premedicated before session;Repositioned     Hand Dominance Right   Extremity/Trunk Assessment Upper Extremity Assessment Upper Extremity Assessment: RUE deficits/detail;LUE deficits/detail RUE Deficits / Details: acute rotator cuff tear but does demonstrate FF 80 degrees during session LUE Deficits / Details: hx of rotator cuff tear but abel to reach top of head and R shoulder   Lower Extremity Assessment Lower Extremity Assessment: Defer to PT evaluation   Cervical / Trunk Assessment Cervical / Trunk Assessment: Other exceptions Cervical / Trunk Exceptions: s/p ACDF   Communication Communication Communication: No difficulties   Cognition Arousal/Alertness: Awake/alert Behavior During Therapy:  WFL for tasks assessed/performed Overall Cognitive Status: Within Functional Limits for tasks assessed                                     General Comments       Exercises     Shoulder Instructions      Home Living Family/patient expects to be discharged to:: Private residence Living Arrangements: Alone Available Help at Discharge: Family;Friend(s);Available PRN/intermittently Type of Home: Apartment Home Access: Stairs to enter Entrance Stairs-Number of Steps: 6-8 Entrance  Stairs-Rails: Left Home Layout: One level     Bathroom Shower/Tub: Tub/shower unit;Walk-in shower   Bathroom Toilet: Standard     Home Equipment: Cane - single point   Additional Comments: pt will have daughter (A) on weekend for bathing and son (A) during the week for basic needs but does not want him to help with adls. Pt will have to be able to complete adls mod I level       Prior Functioning/Environment Level of Independence: Independent                 OT Problem List: Decreased activity tolerance;Decreased knowledge of precautions;Decreased safety awareness;Decreased knowledge of use of DME or AE;Decreased strength      OT Treatment/Interventions: Self-care/ADL training;Therapeutic activities;Patient/family education;Balance training    OT Goals(Current goals can be found in the care plan section) Acute Rehab OT Goals OT Goal Formulation: With patient Time For Goal Achievement: 08/05/17 Potential to Achieve Goals: Good  OT Frequency: Min 2X/week   Barriers to D/C:            Co-evaluation              AM-PAC PT "6 Clicks" Daily Activity     Outcome Measure Help from another person eating meals?: None Help from another person taking care of personal grooming?: A Little Help from another person toileting, which includes using toliet, bedpan, or urinal?: A Lot Help from another person bathing (including washing, rinsing, drying)?: A Little Help from another person to put on and taking off regular upper body clothing?: A Little Help from another person to put on and taking off regular lower body clothing?: A Lot 6 Click Score: 17   End of Session Equipment Utilized During Treatment: Rolling walker Nurse Communication: Mobility status;Precautions  Activity Tolerance: Patient tolerated treatment well Patient left: in bed;with call bell/phone within reach  OT Visit Diagnosis: Unsteadiness on feet (R26.81)                Time: 1443-1540 OT Time  Calculation (min): 26 min Charges:  OT General Charges $OT Visit: 1 Visit OT Evaluation $OT Eval Moderate Complexity: 1 Mod G-Codes: OT G-codes **NOT FOR INPATIENT CLASS** Functional Assessment Tool Used: Clinical judgement Functional Limitation: Self care Self Care Current Status (G8676): At least 20 percent but less than 40 percent impaired, limited or restricted    Jeri Modena   OTR/L Pager: [phone] Office: [phone] .   Parke Poisson B 07/22/2017, 4:18 PM

## 2017-07-22 NOTE — Progress Notes (Signed)
Results for AANYAH, LOA (MRN [id number]) as of 07/22/2017 11:26  Ref. Range 07/21/2017 10:24 07/21/2017 13:54 07/21/2017 17:20 07/21/2017 21:20 07/22/2017 06:43  Glucose-Capillary Latest Ref Range: 65 - 99 mg/dL 237 (H) 217 (H) 262 (H) 263 (H) 239 (H)  Noted that CBGs continue to be greater than 180 mg/dl.  Recommend adding Lantus 15 units daily if blood sugars continue to be elevated.  Harvel Ricks RN BSN CDE Diabetes Coordinator Pager: [phone]  8am-5pm

## 2017-07-22 NOTE — Progress Notes (Signed)
PT Cancellation Note  Patient Details Name: GERALDEAN WALEN MRN: [id number] DOB: August 15, 1965   Cancelled Treatment:   Attempted to treat patient this morning; patient in midst of bathing with CNA staff, plan to attempt back later as time allows.   Deniece Ree PT, DPT, CBIS  Supplemental Physical Therapist [facility]

## 2017-07-23 DIAGNOSIS — M50123 Cervical disc disorder at C6-C7 level with radiculopathy: Secondary | ICD-10-CM | POA: Diagnosis not present

## 2017-07-23 LAB — GLUCOSE, CAPILLARY: GLUCOSE-CAPILLARY: 224 mg/dL — AB (ref 65–99)

## 2017-07-23 MED ORDER — OXYCODONE-ACETAMINOPHEN 5-325 MG PO TABS: 1.0000 | ORAL_TABLET | Freq: Four times a day (QID) | ORAL | 0 refills | Status: DC | PRN

## 2017-07-23 MED ORDER — METHOCARBAMOL 500 MG PO TABS: 500.0000 mg | ORAL_TABLET | Freq: Four times a day (QID) | ORAL | 1 refills | Status: DC | PRN

## 2017-07-23 MED ORDER — GABAPENTIN 100 MG PO CAPS: 100.0000 mg | ORAL_CAPSULE | Freq: Every day | ORAL | 1 refills | Status: DC

## 2017-07-23 MED ORDER — DOCUSATE SODIUM 100 MG PO CAPS: 100.0000 mg | ORAL_CAPSULE | Freq: Two times a day (BID) | ORAL | 0 refills | Status: DC

## 2017-07-23 MED FILL — Thrombin (Recombinant) For Soln 5000 Unit: CUTANEOUS | Qty: 5000 | Status: AC

## 2017-07-23 NOTE — Progress Notes (Signed)
Discharge instructions reviewed with pt and Rx's x 3 given to pt.  PIV removed from RFA with catheter tip intact, no bleeding noted, pt tol well.  Awaiting equipment delivery.  [email or web address]

## 2017-07-23 NOTE — Progress Notes (Signed)
Physical Therapy Treatment Patient Details Name: [name] MRN: [id number] DOB: December 07, 1965 Today's Date: 07/23/2017    History of Present Illness Pt is a 51 y/o female s/p C6-7 ACDF. PMH includes RA, Depression, DM, fibromyalgia, HTN, and back surgery.     PT Comments    Patient received sitting at EOB with family present, pleasant and willing to participate in PT this session. Continued to review bed mobility mechanics due to ongoing patient difficulty with this, she continues to require Mod cues for sequencing and safety especially with sidelying to sit; discussed UE placement as well as techniques to assist in increasing ease of transfer. Also practiced stairs as patient reports she will need to navigate stairs to enter home; able to ascend with S with R railing step over step pattern, requires S with Min-Mod cues for safe stair descent with step to pattern (L railing) mostly to ensure feet positioning solidly on steps. Patient left in room with family present, all questions/concerns addressed and needs met.      Follow Up Recommendations  Home health PT;Supervision for mobility/OOB     Equipment Recommendations  3in1 (PT);Rolling walker with 5" wheels    Recommendations for Other Services       Precautions / Restrictions Precautions Precautions: Cervical Required Braces or Orthoses: Cervical Brace Cervical Brace: Hard collar Restrictions Weight Bearing Restrictions: No    Mobility  Bed Mobility Overal bed mobility: Needs Assistance Bed Mobility: Rolling;Supine to Sit;Sit to Supine Rolling: Supervision Sidelying to sit: Min guard Supine to sit: Min guard Sit to supine: Min guard Sit to sidelying: Min guard General bed mobility comments: continued to practice bed mobility, Mod cues provided for form and safety; patient able to demonstrate safely after visual demonstration and practice with PT  Transfers Overall transfer level: Needs assistance Equipment used:  Rolling walker (2 wheeled) Transfers: Sit to/from Stand Sit to Stand: Supervision         General transfer comment: occasional cues for safety   Ambulation/Gait Ambulation/Gait assistance: Supervision Ambulation Distance (Feet): 80 Feet Assistive device: Rolling walker (2 wheeled) Gait Pattern/deviations: WFL(Within Functional Limits)     General Gait Details: mobility improved with rolling walker, gait mechanics much improved    Stairs     Stair Management: One rail Right(on R with stair ascent, on L with stair descent ) Number of Stairs: 18 General stair comments: min guard, min-mod cues for safe stair descent and making sure feet securely on steps   Wheelchair Mobility    Modified Rankin (Stroke Patients Only)       Balance Overall balance assessment: Needs assistance Sitting-balance support: Bilateral upper extremity supported;Feet supported Sitting balance-Leahy Scale: Good     Standing balance support: Bilateral upper extremity supported Standing balance-Leahy Scale: Good                              Cognition Arousal/Alertness: Awake/alert Behavior During Therapy: WFL for tasks assessed/performed Overall Cognitive Status: Within Functional Limits for tasks assessed                                        Exercises      General Comments General comments (skin integrity, edema, etc.): patient remains anxious but reports increased confidence in going home       Pertinent Vitals/Pain Pain Assessment: 0-10 Pain Score: 4  Pain  Location: neck Pain Descriptors / Indicators: Aching;Grimacing Pain Intervention(s): Monitored during session;Limited activity within patient's tolerance    Home Living                      Prior Function            PT Goals (current goals can now be found in the care plan section) Acute Rehab PT Goals Patient Stated Goal: to decrease pain  PT Goal Formulation: With patient Time For  Goal Achievement: 07/28/17 Potential to Achieve Goals: Good Progress towards PT goals: Progressing toward goals    Frequency    Min 5X/week      PT Plan Current plan remains appropriate    Co-evaluation              AM-PAC PT "6 Clicks" Daily Activity  Outcome Measure  Difficulty turning over in bed (including adjusting bedclothes, sheets and blankets)?: Unable Difficulty moving from lying on back to sitting on the side of the bed? : Unable Difficulty sitting down on and standing up from a chair with arms (e.g., wheelchair, bedside commode, etc,.)?: Unable Help needed moving to and from a bed to chair (including a wheelchair)?: None Help needed walking in hospital room?: None Help needed climbing 3-5 steps with a railing? : A Little 6 Click Score: 14    End of Session Equipment Utilized During Treatment: Cervical collar Activity Tolerance: Patient tolerated treatment well Patient left: with call bell/phone within reach;with family/visitor present;in bed(sitting at EOB )   PT Visit Diagnosis: Other abnormalities of gait and mobility (R26.89);Pain Pain - part of body: (neck )     Time: 1113-1140 PT Time Calculation (min) (ACUTE ONLY): 27 min  Charges:  $Gait Training: 8-22 mins $Therapeutic Activity: 8-22 mins                    G Codes:       Deniece Ree PT, DPT, CBIS  Supplemental Physical Therapist [place]

## 2017-07-23 NOTE — Plan of Care (Signed)
  Progressing Activity: Risk for activity intolerance will decrease 07/23/2017 0509 - Progressing by West Pugh, RN Nutrition: Adequate nutrition will be maintained 07/23/2017 0509 - Progressing by [place] Pugh, RN Coping: Level of anxiety will decrease 07/23/2017 0509 - Progressing by [place] Pugh, RN Elimination: Will not experience complications related to bowel motility 07/23/2017 0509 - Progressing by [place] Pugh, RN Will not experience complications related to urinary retention 07/23/2017 0509 - Progressing by West Pugh, RN Pain Managment: General experience of comfort will improve 07/23/2017 0509 - Progressing by West Pugh, RN Safety: Ability to remain free from injury will improve 07/23/2017 0509 - Progressing by West Pugh, RN Skin Integrity: Risk for impaired skin integrity will decrease 07/23/2017 0509 - Progressing by West Pugh, RN

## 2017-07-23 NOTE — Progress Notes (Signed)
     Subjective: 2 Days Post-Op Procedure(s) (LRB): ANTERIOR CERVICAL DISCECTOMY AND FUSION C6-7, REMOVAL OF SCREW C6 PLATE, DEPUY ZERO-P IMPLANT, LOCAL BONE GRAFT, ALLOGRAFT BONE GRAFT, VIVIGEN (N/A) Awake, alert and oriented x 4. Dressing changed, no drainage, minimal swelling. Occasional spasm posterior cervical strap muscles. Aspen intact, keep soft C-collar for later use at 5-6 weeks post op. Patient reports pain as moderate.    Objective:   VITALS:  Temp:  [98.1 F (36.7 C)-98.2 F (36.8 C)] 98.2 F (36.8 C) (12/12 0701) Pulse Rate:  [81-87] 83 (12/12 0701) Resp:  [18] 18 (12/12 0701) BP: (132-155)/(69-83) 148/83 (12/12 0701) SpO2:  [97 %-100 %] 100 % (12/12 0701)  Neurologically intact ABD soft Neurovascular intact Sensation intact distally Intact pulses distally Dorsiflexion/Plantar flexion intact Incision: dressing C/D/I and no drainage   LABS Recent Labs    07/21/17 0541  HGB 15.5*  WBC 10.7*  PLT 308   Recent Labs    07/21/17 0541  NA 137  K 4.0  CL 103  CO2 21*  BUN 15  CREATININE 0.59  GLUCOSE 224*   Recent Labs    07/21/17 0541  INR 1.07     Assessment/Plan: 2 Days Post-Op Procedure(s) (LRB): ANTERIOR CERVICAL DISCECTOMY AND FUSION C6-7, REMOVAL OF SCREW C6 PLATE, DEPUY ZERO-P IMPLANT, LOCAL BONE GRAFT, ALLOGRAFT BONE GRAFT, VIVIGEN (N/A)  Advance diet Up with therapy D/C IV fluids Discharge home with home health  Basil Dess 07/23/2017, 8:36 AMPatient ID: [name], female   DOB: 25-Jan-1966, 51 y.o.   MRN: [id number]

## 2017-07-23 NOTE — Care Management (Signed)
Per Dr. Louanne Skye : Hospital bed is required to assist with transfers and transition from lying to standing, patient is s/p cervical fusion and right rotator cuff tear.

## 2017-07-23 NOTE — Care Management Note (Signed)
Case Management Note  Patient Details  Name: LARESA OSHIRO MRN: [id number] Date of Birth: 1966/03/15  Subjective/Objective:51 yr old female s/p C6-C7 cervical fusion                    Action/Plan: Case manager spoke with patient and daughter concerning discharge plan. Choice for Home health agency was ordered, referral was called to [place], [facility] Liaison. Patient will have family support at discharge.     Expected Discharge Date:    07/23/17              Expected Discharge Plan:  [place]  In-House Referral:     Discharge planning Services  CM Consult  Post Acute Care Choice:  Durable Medical Equipment, Home Health Choice offered to:  Patient  DME Arranged:  3-N-1, Walker rolling, Hospital bed DME Agency:  [place]:  PT, OT [facility] Agency:  [facility]  Status of Service:  Completed, signed off  If discussed at [place] of Stay Meetings, dates discussed:    Additional Comments:  Ninfa Meeker, RN 07/23/2017, 10:51 AM

## 2017-07-23 NOTE — Progress Notes (Signed)
Occupational Therapy Treatment Patient Details Name: [name] MRN: [id number] DOB: [birth year] Today's Date: 07/23/2017    History of present illness Pt is a 51 y/o female s/p C6-7 ACDF. PMH includes RA, Depression, DM, fibromyalgia, HTN, and back surgery.    OT comments  Pt provided necessary education for aspen collar and tub transfer at this time with good return demo. Pt with pending d/c home today and awaiting DME delivery to the room. Pt eager to d/c home and RN made aware of patients request. Son and daughter in law present for all education.   Follow Up Recommendations  Home health OT    Equipment Recommendations  3 in 1 bedside commode;Tub/shower bench;Other (comment)    Recommendations for Other Services      Precautions / Restrictions Precautions Precautions: Cervical Precaution Comments: Reviewed cervical precautions with pt.  Required Braces or Orthoses: Cervical Brace Cervical Brace: Hard collar Restrictions Weight Bearing Restrictions: No       Mobility Bed Mobility Overal bed mobility: Needs Assistance Bed Mobility: Rolling;Supine to Sit;Sit to Supine Rolling: Supervision Sidelying to sit: Min guard Supine to sit: Supervision Sit to supine: Supervision Sit to sidelying: Min guard General bed mobility comments: continued to practice bed mobility, Mod cues provided for form and safety; patient able to demonstrate safely after visual demonstration and practice with PT  Transfers Overall transfer level: Needs assistance Equipment used: Rolling walker (2 wheeled) Transfers: Sit to/from Stand Sit to Stand: Supervision         General transfer comment: occasional cues for safety     Balance Overall balance assessment: Needs assistance Sitting-balance support: Bilateral upper extremity supported;Feet supported Sitting balance-Leahy Scale: Good     Standing balance support: Bilateral upper extremity supported Standing balance-Leahy Scale: Good                              ADL either performed or assessed with clinical judgement   ADL Overall ADL's : Needs assistance/impaired Eating/Feeding: Independent   Grooming: Supervision/safety   Upper Body Bathing: Moderate assistance   Lower Body Bathing: Moderate assistance Lower Body Bathing Details (indicate cue type and reason): educated on use of Programme researcher, broadcasting/film/video: Supervision/safety       Tub/ Banker: Tub transfer;3 in Mudlogger Details (indicate cue type and reason): min guard- daughter to (A) Functional mobility during ADLs: Supervision/safety General ADL Comments: pt educated on aspen collar with don doff hygiene and changing of pads. pt required reinforcement of information from previous OT session regarding bathing/ dressing. pt currently reports itching all over and educated not to scratch at neck area but instead to address the symptom by talking to RN staff. pt educated on 3n1 placement and daughter in law is a CNA with experience / present in room to help with home setup     Vision       Perception     Praxis      Cognition Arousal/Alertness: Awake/alert Behavior During Therapy: WFL for tasks assessed/performed Overall Cognitive Status: Within Functional Limits for tasks assessed                                          Exercises     Shoulder Instructions       General Comments dressing  covered with pink foam dressing and pt provided 7 dressing bandages for home use. MD requesting every other day dressing change so provided neccessary materials    Pertinent Vitals/ Pain       Pain Assessment: Faces Pain Score: 4  Faces Pain Scale: Hurts little more Pain Location: neck Pain Descriptors / Indicators: Aching;Grimacing Pain Intervention(s): Monitored during session;Premedicated before session;Repositioned  Home Living                                           Prior Functioning/Environment              Frequency  Min 2X/week        Progress Toward Goals  OT Goals(current goals can now be found in the care plan section)  Progress towards OT goals: Progressing toward goals  Acute Rehab OT Goals Patient Stated Goal: to decrease pain  OT Goal Formulation: With patient Time For Goal Achievement: 08/05/17 Potential to Achieve Goals: Good ADL Goals Pt Will Perform Lower Body Dressing: with modified independence;with adaptive equipment;sit to/from stand Pt Will Perform Tub/Shower Transfer: Tub transfer;ambulating;tub bench;rolling walker;with supervision  Plan Discharge plan remains appropriate    Co-evaluation                 AM-PAC PT "6 Clicks" Daily Activity     Outcome Measure   Help from another person eating meals?: None Help from another person taking care of personal grooming?: A Little Help from another person toileting, which includes using toliet, bedpan, or urinal?: A Lot Help from another person bathing (including washing, rinsing, drying)?: A Little Help from another person to put on and taking off regular upper body clothing?: A Little Help from another person to put on and taking off regular lower body clothing?: A Lot 6 Click Score: 17    End of Session Equipment Utilized During Treatment: Rolling walker  OT Visit Diagnosis: Unsteadiness on feet (R26.81)   Activity Tolerance Patient tolerated treatment well   Patient Left in bed;with call bell/phone within reach   Nurse Communication Mobility status;Precautions        Time: 4174-0814 OT Time Calculation (min): 33 min  Charges: OT Treatments $Self Care/Home Management : 23-37 mins   Jeri Modena   OTR/L Pager: [phone] Office: [phone] .    Parke Poisson B 07/23/2017, 2:34 PM

## 2017-07-24 ENCOUNTER — Telehealth (INDEPENDENT_AMBULATORY_CARE_PROVIDER_SITE_OTHER): Payer: Self-pay | Admitting: Specialist

## 2017-07-24 NOTE — Telephone Encounter (Signed)
I called and lmom for [name] and I gave verbal ok for PT orders

## 2017-07-24 NOTE — Telephone Encounter (Signed)
Verbal PT orders needed-   2x for 3 weeks 1x for 2 weeks  CB #[phone]

## 2017-07-28 ENCOUNTER — Encounter (HOSPITAL_COMMUNITY): Payer: Self-pay | Admitting: Specialist

## 2017-08-06 ENCOUNTER — Inpatient Hospital Stay (INDEPENDENT_AMBULATORY_CARE_PROVIDER_SITE_OTHER): Payer: Medicare Other | Admitting: Specialist

## 2017-08-07 ENCOUNTER — Ambulatory Visit (INDEPENDENT_AMBULATORY_CARE_PROVIDER_SITE_OTHER): Payer: Medicare Other | Admitting: Specialist

## 2017-08-07 ENCOUNTER — Ambulatory Visit (INDEPENDENT_AMBULATORY_CARE_PROVIDER_SITE_OTHER): Payer: Medicare Other

## 2017-08-07 ENCOUNTER — Encounter (INDEPENDENT_AMBULATORY_CARE_PROVIDER_SITE_OTHER): Payer: Self-pay | Admitting: Specialist

## 2017-08-07 VITALS — BP 132/81 | HR 99 | Ht 64.0 in | Wt 205.0 lb

## 2017-08-07 DIAGNOSIS — Z969 Presence of functional implant, unspecified: Secondary | ICD-10-CM

## 2017-08-07 DIAGNOSIS — M4322 Fusion of spine, cervical region: Secondary | ICD-10-CM

## 2017-08-07 NOTE — Patient Instructions (Signed)
Plan: Avoid overhead lifting and overhead use of the arms. Do not lift greater than 5 lbs. Adjust head rest in vehicle to prevent hyperextension if rear ended. Take extra precautions to avoid falling.

## 2017-08-07 NOTE — Progress Notes (Signed)
   Post-Op Visit Note   [name]           Date of Birth: [birth year]           MRN: [id number] Visit Date: 08/07/2017 PCP: Nolene Ebbs, MD   Assessment & Plan:2 weeks post op doing well Incision is healed, motor is normal. Legs NV normal.  Chief Complaint:  Chief Complaint  Patient presents with  . Neck - Routine Post Op   Visit Diagnoses:  1. Retained orthopedic hardware   2. Fusion of spine, cervical region     Plan: Avoid overhead lifting and overhead use of the arms. Do not lift greater than 5 lbs. Adjust head rest in vehicle to prevent hyperextension if rear ended. Take extra precautions to avoid falling.    Follow-Up Instructions: Return in about 4 weeks (around 09/04/2017).   Orders:  Orders Placed This Encounter  Procedures  . XR Cervical Spine 2 or 3 views   No orders of the defined types were placed in this encounter.   Imaging: Xr Cervical Spine 2 Or 3 Views  Result Date: 08/07/2017 AP and lateral radiographs of the cervical spine with Zero P implant at the C6-7 in good position and alignment. ST swelling is minimal no unusual findings   PMFS History: Patient Active Problem List   Diagnosis Date Noted  . Complete tear of right rotator cuff 07/22/2017    Priority: High    Class: Chronic  . Cervical disc herniation 07/21/2017    Priority: High    Class: Acute  . Retained orthopedic hardware 07/22/2017    Priority: Medium    Class: Chronic  . Status post cervical spinal fusion 07/21/2017  . Chronic back pain    Past Medical History:  Diagnosis Date  . Anxiety   . Arthritis    rheumatoid , osteoarthritis  . Bronchitis   . Chronic back pain   . Depression   . Diabetes mellitus   . Fatty liver   . Fibromyalgia   . Hypertension   . Pancreatitis   . Pneumonia     Family History  Problem Relation Age of Onset  . Hypertension Father   . Renal Disease Father   . Diabetes Mother   . Hypertension Mother   . Edema Mother     . Diabetes Sister   . Diabetes Brother     Past Surgical History:  Procedure Laterality Date  . ABDOMINAL HYSTERECTOMY    . ANTERIOR CERVICAL DECOMP/DISCECTOMY FUSION N/A 07/21/2017   Procedure: ANTERIOR CERVICAL DISCECTOMY AND FUSION C6-7, REMOVAL OF SCREW C6 PLATE, DEPUY ZERO-P IMPLANT, LOCAL BONE GRAFT, ALLOGRAFT BONE GRAFT, VIVIGEN;  Surgeon: Jessy Oto, MD;  Location: [facility];  Service: Orthopedics;  Laterality: N/A;  . BACK SURGERY     laminectomy  . BONE GRAFT HIP ILIAC CREST    . CARPAL TUNNEL RELEASE    . CERVICAL DISCECTOMY  07/21/2017  . CESAREAN SECTION    . CHOLECYSTECTOMY    . COLONOSCOPY    . frontal fusion    . neck fusion     patient reported   Social History   Occupational History  . Not on file  Tobacco Use  . Smoking status: Current Every Day Smoker    Packs/day: 1.00    Types: Cigarettes  . Smokeless tobacco: Never Used  Substance and Sexual Activity  . Alcohol use: No  . Drug use: No  . Sexual activity: Not on file

## 2017-09-02 ENCOUNTER — Ambulatory Visit (INDEPENDENT_AMBULATORY_CARE_PROVIDER_SITE_OTHER): Payer: Medicare Other

## 2017-09-02 ENCOUNTER — Ambulatory Visit (INDEPENDENT_AMBULATORY_CARE_PROVIDER_SITE_OTHER): Payer: Medicare Other | Admitting: Specialist

## 2017-09-02 ENCOUNTER — Encounter (INDEPENDENT_AMBULATORY_CARE_PROVIDER_SITE_OTHER): Payer: Self-pay | Admitting: Specialist

## 2017-09-02 ENCOUNTER — Ambulatory Visit (INDEPENDENT_AMBULATORY_CARE_PROVIDER_SITE_OTHER): Payer: Self-pay

## 2017-09-02 VITALS — BP 122/79 | HR 93 | Ht 64.0 in | Wt 205.0 lb

## 2017-09-02 DIAGNOSIS — M75121 Complete rotator cuff tear or rupture of right shoulder, not specified as traumatic: Secondary | ICD-10-CM | POA: Diagnosis not present

## 2017-09-02 DIAGNOSIS — G8929 Other chronic pain: Secondary | ICD-10-CM

## 2017-09-02 DIAGNOSIS — M25512 Pain in left shoulder: Secondary | ICD-10-CM | POA: Diagnosis not present

## 2017-09-02 DIAGNOSIS — M7542 Impingement syndrome of left shoulder: Secondary | ICD-10-CM

## 2017-09-02 DIAGNOSIS — Z969 Presence of functional implant, unspecified: Secondary | ICD-10-CM

## 2017-09-02 MED ORDER — METHYLPREDNISOLONE ACETATE 40 MG/ML IJ SUSP
40.0000 mg | INTRAMUSCULAR | Status: AC | PRN
  Administered 2017-09-02: 40 mg via INTRA_ARTICULAR

## 2017-09-02 MED ORDER — LIDOCAINE HCL 1 % IJ SOLN
3.0000 mL | INTRAMUSCULAR | Status: AC | PRN
  Administered 2017-09-02: 3 mL

## 2017-09-02 MED ORDER — METHOCARBAMOL 500 MG PO TABS: 500.0000 mg | ORAL_TABLET | Freq: Three times a day (TID) | ORAL | 0 refills | Status: DC | PRN

## 2017-09-02 MED ORDER — BUPIVACAINE HCL 0.25 % IJ SOLN
4.0000 mL | INTRAMUSCULAR | Status: AC | PRN
  Administered 2017-09-02: 4 mL via INTRA_ARTICULAR

## 2017-09-02 NOTE — Progress Notes (Signed)
Office Visit Note   [name]           Date of Birth: 09/29/1965           MRN: [id number] Visit Date: 09/02/2017              Requested by: Nolene Ebbs, MD [address] [place], [place] [address] PCP: Nolene Ebbs, MD   Assessment & Plan: Visit Diagnoses:  1. Retained orthopedic hardware   2. Chronic left shoulder pain   3. Complete tear of right rotator cuff   4. Impingement syndrome of left shoulder     Plan: Today I offered conservative treatment of her left shoulder pain with injection. After patient consent left shoulder was prepped with Betadine and subacromial Marcaine/Depo-Medrol 5-1 injection was performed. After sitting for several minutes patient did report great improvement of her pain with Marcaine in place and range of motion was also better. Patient will keep appointment with Dr. Alphonzo Severance is scheduled for tomorrow to discuss treatment options for her right shoulder. Patient will very likely need MRI of the left shoulder depending on her response with today's injection. Follow-up with Dr. Louanne Skye in 6 weeks for recheck of her neck. Patient can transition to soft collar. New collar was given today.  Follow-Up Instructions: Return in about 6 weeks (around 10/14/2017) for Keep appointment with Dr. Marlou Sa 09/03/2017 for right shoulder rotator cuff tear.   Orders:  Orders Placed This Encounter  Procedures  . Large Joint Inj  . XR Cervical Spine 2 or 3 views  . XR Shoulder Left   Meds ordered this encounter  Medications  . methocarbamol (ROBAXIN) 500 MG tablet    Sig: Take 1 tablet (500 mg total) by mouth every 8 (eight) hours as needed for muscle spasms.    Dispense:  50 tablet    Refill:  0      Procedures: Large Joint Inj: L subacromial bursa on 09/02/2017 3:49 PM Indications: pain Details: 25 G 1.5 in needle, posterior approach Medications: 3 mL lidocaine 1 %; 4 mL bupivacaine 0.25 %; 40 mg methylPREDNISolone acetate 40 MG/ML Consent was  given by the patient. Patient was prepped and draped in the usual sterile fashion.       Clinical Data: No additional findings.   Subjective: Chief Complaint  Patient presents with  . Neck - Follow-up  . Left Shoulder - Follow-up    HPI Patient 6 weeks status post C6-7 fusion with zeroP implant returns. States that her preoperative symptoms are better. She's having increased pain currently in both shoulders. She's had right shoulder MRI performed 06/26/2017 and that report read severe tendinosis of the supraspinatus tendon with a large full-thickness tear of the anterior supraspinatus tendon measuring 11 mm in anterior posterior dimension and with 2 cm of retraction. Severe tendinosis of the infraspinatus tendon with a small interstitial tear. Mild tendinosis of the intra-articular portion of the long head of the biceps tendon. Moderate subacromial/subdeltoid bursitis. Dr. Louanne Skye had previously reviewed MRI results with patient and it was mentioned that she would likely need something done after recovery from her cervical spine. Currently both shoulders getting worse and aggravated with overhead activity and reaching behind her back. Pain at night when she lays on both shoulders.   Review of Systems No current cardiac pulmonary GI GU issues  Objective: Vital Signs: BP 122/79 (BP Location: Left Arm, Patient Position: Sitting)   Pulse 93   Ht 5\' 4"  (1.626 m)   Wt 205  lb (93 kg)   BMI 35.19 kg/m   Physical Exam  Constitutional: She appears well-developed. No distress.  HENT:  Head: Normocephalic.  Pulmonary/Chest: No respiratory distress.  Musculoskeletal:  Bilateral shoulders flexion/abduction to about 90 with marked discomfort. Positive impingement test. Pain with supraspinatus resistance. It is somewhat difficult to get a good exam of both shoulders due to her pain level. Neurovascularly intact.  Skin: Skin is warm and dry.  Psychiatric: She has a normal mood and affect.     Ortho Exam  Specialty Comments:  No specialty comments available.  Imaging: No results found.   PMFS History: Patient Active Problem List   Diagnosis Date Noted  . Complete tear of right rotator cuff 07/22/2017    Class: Chronic  . Retained orthopedic hardware 07/22/2017    Class: Chronic  . Cervical disc herniation 07/21/2017    Class: Acute  . Status post cervical spinal fusion 07/21/2017  . Chronic back pain    Past Medical History:  Diagnosis Date  . Anxiety   . Arthritis    rheumatoid , osteoarthritis  . Bronchitis   . Chronic back pain   . Depression   . Diabetes mellitus   . Fatty liver   . Fibromyalgia   . Hypertension   . Pancreatitis   . Pneumonia     Family History  Problem Relation Age of Onset  . Hypertension Father   . Renal Disease Father   . Diabetes Mother   . Hypertension Mother   . Edema Mother   . Diabetes Sister   . Diabetes Brother     Past Surgical History:  Procedure Laterality Date  . ABDOMINAL HYSTERECTOMY    . ANTERIOR CERVICAL DECOMP/DISCECTOMY FUSION N/A 07/21/2017   Procedure: ANTERIOR CERVICAL DISCECTOMY AND FUSION C6-7, REMOVAL OF SCREW C6 PLATE, DEPUY ZERO-P IMPLANT, LOCAL BONE GRAFT, ALLOGRAFT BONE GRAFT, VIVIGEN;  Surgeon: Jessy Oto, MD;  Location: [facility];  Service: Orthopedics;  Laterality: N/A;  . BACK SURGERY     laminectomy  . BONE GRAFT HIP ILIAC CREST    . CARPAL TUNNEL RELEASE    . CERVICAL DISCECTOMY  07/21/2017  . CESAREAN SECTION    . CHOLECYSTECTOMY    . COLONOSCOPY    . frontal fusion    . neck fusion     patient reported   Social History   Occupational History  . Not on file  Tobacco Use  . Smoking status: Current Every Day Smoker    Packs/day: 1.00    Types: Cigarettes  . Smokeless tobacco: Never Used  Substance and Sexual Activity  . Alcohol use: No  . Drug use: No  . Sexual activity: Not on file

## 2017-09-03 ENCOUNTER — Encounter (INDEPENDENT_AMBULATORY_CARE_PROVIDER_SITE_OTHER): Payer: Self-pay | Admitting: Orthopedic Surgery

## 2017-09-03 ENCOUNTER — Ambulatory Visit (INDEPENDENT_AMBULATORY_CARE_PROVIDER_SITE_OTHER): Payer: Medicare Other | Admitting: Orthopedic Surgery

## 2017-09-03 DIAGNOSIS — M75121 Complete rotator cuff tear or rupture of right shoulder, not specified as traumatic: Secondary | ICD-10-CM | POA: Diagnosis not present

## 2017-09-03 NOTE — Progress Notes (Signed)
Office Visit Note   [name]           Date of Birth: 1966/04/03           MRN: [id number] Visit Date: 09/03/2017 Requested by: Nolene Ebbs, MD [address] [place], [place] [address] PCP: Nolene Ebbs, MD  Subjective: Chief Complaint  Patient presents with  . Right Shoulder - Pain    HPI: [name] is a 52 year old female with a rotator cuff problem.  She reports 2-year history of right shoulder pain.  No history of injury.  She has had multiple neck and back surgeries and has done reasonably well with those.  She is right-hand dominant.  She is been taking Tylenol Robaxin Percocet and Neurontin.  She also has a history of pancreatitis.  She had an injection in the left shoulder yesterday which is helped some.  She has had extensive treatment for the right shoulder with continued symptoms.  MRI scan is reviewed.  Shows retracted supraspinatus tear along with biceps tendinitis and AC joint arthropathy.              ROS: All systems reviewed are negative as they relate to the chief complaint within the history of present illness.  Patient denies  fevers or chills.   Assessment & Plan: Visit Diagnoses:  1. Complete tear of right rotator cuff     Plan: Impression is right shoulder pain with rotator cuff tear and weakness.  Her exam is a little difficult today due to guarding.  Hard to say if she has an early frozen shoulder.  Skin does not necessarily look like frozen shoulder but she is reluctant to do any forward flexion or abduction past 90.  She does have weakness to supraspinatus testing on the right-hand side.  Plan at this time is arthroscopy with biceps tenodesis and rotator cuff repair.  We can address the [facility] joint potentially as well.  Risk and benefits are discussed including the length of the recovery as well as the fact that her shoulder will likely hurt more before it hurts less.  The need for maintaining shoulder range of motion is very important.  Patient  understands the risk and benefits of surgery.  I would like to use a CPM machine with her postop.  All questions answered.  Follow-Up Instructions: No Follow-up on file.   Orders:  No orders of the defined types were placed in this encounter.  No orders of the defined types were placed in this encounter.     Procedures: No procedures performed   Clinical Data: No additional findings.  Objective: Vital Signs: There were no vitals taken for this visit.  Physical Exam:   Constitutional: Patient appears well-developed HEENT:  Head: Normocephalic Eyes:EOM are normal Neck: Normal range of motion Cardiovascular: Normal rate Pulmonary/chest: Effort normal Neurologic: Patient is alert Skin: Skin is warm Psychiatric: Patient has normal mood and affect  Orthopedic exam demonstrates  Ortho Exam: Somewhat limited neck range of motion consistent with her recent surgery.  Motor sensory function to the hands intact with palpable radial pulse and good grip strength.  She has pain with range of motion of that right shoulder along with rotator cuff weakness.  No real restriction however of external rotation at 15 degrees of abduction right versus left shoulder.  In general any manipulation or palpation of the shoulder creates some anxiety and pain symptoms in this patient.  Specialty Comments:  No specialty comments available.  Imaging: Xr Cervical Spine 2  Or 3 Views  Result Date: 09/02/2017 Cervical spine shows her hardware to be intact. No acute findings.  Xr Shoulder Left  Result Date: 09/02/2017 X-ray left shoulder she has moderate acromioclavicular degenerative changes. Type III acromion. No acute changes.    PMFS History: Patient Active Problem List   Diagnosis Date Noted  . Complete tear of right rotator cuff 07/22/2017    Class: Chronic  . Retained orthopedic hardware 07/22/2017    Class: Chronic  . Cervical disc herniation 07/21/2017    Class: Acute  . Status post  cervical spinal fusion 07/21/2017  . Chronic back pain    Past Medical History:  Diagnosis Date  . Anxiety   . Arthritis    rheumatoid , osteoarthritis  . Bronchitis   . Chronic back pain   . Depression   . Diabetes mellitus   . Fatty liver   . Fibromyalgia   . Hypertension   . Pancreatitis   . Pneumonia     Family History  Problem Relation Age of Onset  . Hypertension Father   . Renal Disease Father   . Diabetes Mother   . Hypertension Mother   . Edema Mother   . Diabetes Sister   . Diabetes Brother     Past Surgical History:  Procedure Laterality Date  . ABDOMINAL HYSTERECTOMY    . ANTERIOR CERVICAL DECOMP/DISCECTOMY FUSION N/A 07/21/2017   Procedure: ANTERIOR CERVICAL DISCECTOMY AND FUSION C6-7, REMOVAL OF SCREW C6 PLATE, DEPUY ZERO-P IMPLANT, LOCAL BONE GRAFT, ALLOGRAFT BONE GRAFT, VIVIGEN;  Surgeon: Jessy Oto, MD;  Location: [facility];  Service: Orthopedics;  Laterality: N/A;  . BACK SURGERY     laminectomy  . BONE GRAFT HIP ILIAC CREST    . CARPAL TUNNEL RELEASE    . CERVICAL DISCECTOMY  07/21/2017  . CESAREAN SECTION    . CHOLECYSTECTOMY    . COLONOSCOPY    . frontal fusion    . neck fusion     patient reported   Social History   Occupational History  . Not on file  Tobacco Use  . Smoking status: Current Every Day Smoker    Packs/day: 1.00    Types: Cigarettes  . Smokeless tobacco: Never Used  Substance and Sexual Activity  . Alcohol use: No  . Drug use: No  . Sexual activity: Not on file

## 2017-09-17 ENCOUNTER — Other Ambulatory Visit (INDEPENDENT_AMBULATORY_CARE_PROVIDER_SITE_OTHER): Payer: Self-pay | Admitting: Orthopedic Surgery

## 2017-09-17 DIAGNOSIS — M75101 Unspecified rotator cuff tear or rupture of right shoulder, not specified as traumatic: Secondary | ICD-10-CM

## 2017-09-23 NOTE — Pre-Procedure Instructions (Signed)
[name]  09/23/2017      [facility] [address], [place] [address] Phone: [phone] Fax: [phone]  [facility], [place] [address] Phone: [phone] Fax: [phone]    Your procedure is scheduled on Friday February 15.  Report to [facility] Admitting at 8:00 A.M.  Call this number if you have problems the morning of surgery:  [phone]   Remember:  Do not eat food or drink liquids after midnight.  Take these medicines the morning of surgery with A SIP OF WATER:   Metoprolol (Toprol-XL) Omeprazole (prilosec) Oxycodone-acetaminophen (percocet) if needed Alprazolam (Xanax) if needed Albuterol if needed (please bring inhaler to hospital with you) Cyclobenzaprine (Flexeril) if needed  DO NOT TAKE metformin (Glucophage) the day of surgery.   STOP TAKING phentermine (Apidex)  7 days prior to surgery STOP taking any Aspirin(unless otherwise instructed by your surgeon), Aleve, Naproxen, Ibuprofen, Motrin, Advil, Goody's, BC's, all herbal medications, fish oil, and all vitamins     How to Manage Your Diabetes Before and After Surgery  Why is it important to control my blood sugar before and after surgery? . Improving blood sugar levels before and after surgery helps healing and can limit problems. . A way of improving blood sugar control is eating a healthy diet by: o  Eating less sugar and carbohydrates o  Increasing activity/exercise o  Talking with your doctor about reaching your blood sugar goals . High blood sugars (greater than 180 mg/dL) can raise your risk of infections and slow your recovery, so you will need to focus on controlling your diabetes during the weeks before surgery. . Make sure that the doctor who takes care of your diabetes knows about your planned surgery  including the date and location.  How do I manage my blood sugar before surgery? . Check your blood sugar at least 4 times a day, starting 2 days before surgery, to make sure that the level is not too high or low. o Check your blood sugar the morning of your surgery when you wake up and every 2 hours until you get to the Short Stay unit. . If your blood sugar is less than 70 mg/dL, you will need to treat for low blood sugar: o Do not take insulin. o Treat a low blood sugar (less than 70 mg/dL) with  cup of clear juice (cranberry or apple), 4 glucose tablets, OR glucose gel. Recheck blood sugar in 15 minutes after treatment (to make sure it is greater than 70 mg/dL). If your blood sugar is not greater than 70 mg/dL on recheck, call [phone] o  for further instructions. . Report your blood sugar to the short stay nurse when you get to Short Stay.  . If you are admitted to the hospital after surgery: o Your blood sugar will be checked by the staff and you will probably be given insulin after surgery (instead of oral diabetes medicines) to make sure you have good blood sugar levels. o The goal for blood sugar control after surgery is 80-180 mg/dL.               Do not wear jewelry, make-up or nail polish.  Do not wear lotions, powders, or perfumes, or deodorant.  Do not shave 48 hours prior to surgery.  Men may shave face and neck.  Do  not bring valuables to the hospital.  [facility] is not responsible for any belongings or valuables.  Contacts, dentures or bridgework may not be worn into surgery.  Leave your suitcase in the car.  After surgery it may be brought to your room.  For patients admitted to the hospital, discharge time will be determined by your treatment team.  Patients discharged the day of surgery will not be allowed to drive home.   Special instructions:    [facility]- Preparing For Surgery  Before surgery, you can play an important role. Because skin is  not sterile, your skin needs to be as free of germs as possible. You can reduce the number of germs on your skin by washing with CHG (chlorahexidine gluconate) Soap before surgery.  CHG is an antiseptic cleaner which kills germs and bonds with the skin to continue killing germs even after washing.  Please do not use if you have an allergy to CHG or antibacterial soaps. If your skin becomes reddened/irritated stop using the CHG.  Do not shave (including legs and underarms) for at least 48 hours prior to first CHG shower. It is OK to shave your face.  Please follow these instructions carefully.   1. Shower the NIGHT BEFORE SURGERY and the MORNING OF SURGERY with CHG.   2. If you chose to wash your hair, wash your hair first as usual with your normal shampoo.  3. After you shampoo, rinse your hair and body thoroughly to remove the shampoo.  4. Use CHG as you would any other liquid soap. You can apply CHG directly to the skin and wash gently with a scrungie or a clean washcloth.   5. Apply the CHG Soap to your body ONLY FROM THE NECK DOWN.  Do not use on open wounds or open sores. Avoid contact with your eyes, ears, mouth and genitals (private parts). Wash Face and genitals (private parts)  with your normal soap.  6. Wash thoroughly, paying special attention to the area where your surgery will be performed.  7. Thoroughly rinse your body with warm water from the neck down.  8. DO NOT shower/wash with your normal soap after using and rinsing off the CHG Soap.  9. Pat yourself dry with a CLEAN TOWEL.  10. Wear CLEAN PAJAMAS to bed the night before surgery, wear comfortable clothes the morning of surgery  11. Place CLEAN SHEETS on your bed the night of your first shower and DO NOT SLEEP WITH PETS.    Day of Surgery: Do not apply any deodorants/lotions. Please wear clean clothes to the hospital/surgery center.      Please read over the following fact sheets that you were given. Coughing  and Deep Breathing and Surgical Site Infection Prevention

## 2017-09-24 ENCOUNTER — Encounter (HOSPITAL_COMMUNITY): Payer: Self-pay

## 2017-09-24 ENCOUNTER — Other Ambulatory Visit: Payer: Self-pay

## 2017-09-24 ENCOUNTER — Encounter (HOSPITAL_COMMUNITY): Payer: Self-pay | Admitting: Vascular Surgery

## 2017-09-24 ENCOUNTER — Encounter (HOSPITAL_COMMUNITY)
Admission: RE | Admit: 2017-09-24 | Discharge: 2017-09-24 | Disposition: A | Discharge status: Home / Self Care | Payer: Medicare Other | Source: Ambulatory Visit | Attending: Orthopedic Surgery | Admitting: Orthopedic Surgery

## 2017-09-24 DIAGNOSIS — M75101 Unspecified rotator cuff tear or rupture of right shoulder, not specified as traumatic: Secondary | ICD-10-CM | POA: Insufficient documentation

## 2017-09-24 DIAGNOSIS — Z7982 Long term (current) use of aspirin: Secondary | ICD-10-CM | POA: Insufficient documentation

## 2017-09-24 DIAGNOSIS — Z01818 Encounter for other preprocedural examination: Secondary | ICD-10-CM | POA: Diagnosis not present

## 2017-09-24 DIAGNOSIS — Z79899 Other long term (current) drug therapy: Secondary | ICD-10-CM | POA: Insufficient documentation

## 2017-09-24 HISTORY — DX: Headache: R51

## 2017-09-24 HISTORY — DX: Headache, unspecified: R51.9

## 2017-09-24 HISTORY — DX: Gastro-esophageal reflux disease without esophagitis: K21.9

## 2017-09-24 LAB — CBC
HEMATOCRIT: 46.4 % — AB (ref 36.0–46.0)
Hemoglobin: 15.5 g/dL — ABNORMAL HIGH (ref 12.0–15.0)
MCH: 28.8 pg (ref 26.0–34.0)
MCHC: 33.4 g/dL (ref 30.0–36.0)
MCV: 86.2 fL (ref 78.0–100.0)
Platelets: 336 10*3/uL (ref 150–400)
RBC: 5.38 MIL/uL — ABNORMAL HIGH (ref 3.87–5.11)
RDW: 14.2 % (ref 11.5–15.5)
WBC: 10.4 10*3/uL (ref 4.0–10.5)

## 2017-09-24 LAB — COMPREHENSIVE METABOLIC PANEL
ALBUMIN: 4.5 g/dL (ref 3.5–5.0)
ALK PHOS: 110 U/L (ref 38–126)
ALT: 22 U/L (ref 14–54)
ANION GAP: 14 (ref 5–15)
AST: 23 U/L (ref 15–41)
BILIRUBIN TOTAL: 0.5 mg/dL (ref 0.3–1.2)
BUN: 8 mg/dL (ref 6–20)
CALCIUM: 9.5 mg/dL (ref 8.9–10.3)
CO2: 24 mmol/L (ref 22–32)
Chloride: 100 mmol/L — ABNORMAL LOW (ref 101–111)
Creatinine, Ser: 0.66 mg/dL (ref 0.44–1.00)
GFR calc Af Amer: >60 mL/min (ref >60)
GFR calc non Af Amer: >60 mL/min (ref >60)
GLUCOSE: 175 mg/dL — AB (ref 65–99)
Potassium: 4 mmol/L (ref 3.5–5.1)
SODIUM: 138 mmol/L (ref 135–145)
Total Protein: 7.7 g/dL (ref 6.5–8.1)

## 2017-09-24 LAB — GLUCOSE, CAPILLARY: Glucose-Capillary: 179 mg/dL — ABNORMAL HIGH (ref 65–99)

## 2017-09-24 LAB — HEMOGLOBIN A1C
Hgb A1c MFr Bld: 9.2 % — ABNORMAL HIGH (ref 4.8–5.6)
Mean Plasma Glucose: 217.34 mg/dL

## 2017-09-24 NOTE — Progress Notes (Signed)
   09/24/17 0950  OBSTRUCTIVE SLEEP APNEA  Have you ever been diagnosed with sleep apnea through a sleep study? No  Do you snore loudly (loud enough to be heard through closed doors)?  1  Do you often feel tired, fatigued, or sleepy during the daytime (such as falling asleep during driving or talking to someone)? 0  Has anyone observed you stop breathing during your sleep? 0  Do you have, or are you being treated for high blood pressure? 1  BMI more than 35 kg/m2? 1  Age > 50 (1-yes) 1  Neck circumference greater than:Female 16 inches or larger, Female 17inches or larger? 0  Female Gender (Yes=1) 0  Obstructive Sleep Apnea Score 4  Score 5 or greater  Results sent to PCP

## 2017-09-24 NOTE — Progress Notes (Signed)
PCP: Dr. Nolene Ebbs NO cardiologist  Pt. Reports she doesn't check blood sugars  Last HgbA1C 8.3 on 07-21-17  Pt. Last dose phentermine 09-24-17

## 2017-09-24 NOTE — Progress Notes (Signed)
Anesthesia Chart Review: Patient is a 52 year old female scheduled for right shoulder arthroscopy with subacromial decompression, rotator cuff repair, biceps tenodesis, possible distal clavicle excision on 09/26/2017 by Dr. Meredith Pel.  History includes smoking, DM2, HTN, chronic back pain, pancreatitis, fibromyalgia, anxiety, depression, RA, osteoarthritis, fatty liver, GERD, headaches, hysterectomy, cholecystectomy '10, C5-6 ACDF 03/26/10, C6-7 ACDF/removal of C6 plate 07/21/17. She had normal coronaries in 2008. BMI is consistent with obesity. OSA screening score of 4.   PCP is Dr. Nolene Ebbs with [facility].  Meds include Tylenol No. 3, albuterol, Xanax, aspirin 81 mg (not taking > 1 month), Azor, Flexeril, Trulicity, Flonase, Neurontin, metformin, Robaxin, Toprol-XL, Percocet, Zocor, Zanaflex, Viibryd, phentermine. She reported taking phentermine intermittently--she took one dose on 09/24/17, but had not taken for at least four days prior to that and was instructed not to take any more before surgery (reviewed this with anesthesiologist Dr. Roanna Banning).   EKG 07/08/17 ([facility]; scanned under Media tab, Correspondence 07/21/17): SR, anteroseptal infarct (age undetermined).  Cardiac cath 06/11/07 (Dr. Quay Burow): SELECTIVE CORONARY ANGIOGRAPHY: 1. Left main normal. 2. LAD normal. 3. Left circumflex normal. 4. Right coronary artery was dominant and normal. LEFT VENTRICULOGRAPHY: The overall LVEF was estimated at greater than 60% without focal wall motion abnormalities. IMPRESSION:  Ms. Melaragno has normal coronary arteries, normal LV function.  I believe her chest pain is noncardiac and possibly related to reflux.  Preoperative labs noted. Cr 0.66. AST/ALT WNL. H/H 15.5/46.4. PLT 336. A1c 9.2 (up from 8.3 on 07/21/17), consistent with average glucose of 217.34. She admits to not really checking home glucose levels. I notified Sherrie at Dr. Randel Pigg office of  patient's elevated A1c. Will defer to Dr. Marlou Sa if he thinks surgery should be delayed; however, patient would still be at risk for cancellation if her fasting glucose was significantly elevated on the day of surgery.   George Hugh [facility] Short Stay Center/Anesthesiology Phone [phone] 09/24/2017 4:08 PM

## 2017-09-24 NOTE — Pre-Procedure Instructions (Signed)
[name]  09/24/2017      [facility], [place] [address] Phone: [phone] Fax: [phone]  [facility], [place] [address] Phone: [phone] Fax: [phone]    Your procedure is scheduled on Friday February 15.  Report to [facility] Admitting at 8:00 A.M.  Call this number if you have problems the morning of surgery:  [phone]   Remember:  Do not eat food or drink liquids after midnight.  Take these medicines the morning of surgery with A SIP OF WATER:   Metoprolol (Toprol-XL) Omeprazole (prilosec) Oxycodone-acetaminophen (percocet) if needed Alprazolam (Xanax) if needed Albuterol if needed (please bring inhaler to hospital with you) Cyclobenzaprine (Flexeril) if needed  DO NOT TAKE metformin (Glucophage) the day of surgery.   STOP TAKING phentermine (Apidex)  7 days prior to surgery STOP taking any Aspirin(unless otherwise instructed by your surgeon), Aleve, Naproxen, Ibuprofen, Motrin, Advil, Goody's, BC's, all herbal medications, fish oil, and all vitamins     How to Manage Your Diabetes Before and After Surgery  Why is it important to control my blood sugar before and after surgery? . Improving blood sugar levels before and after surgery helps healing and can limit problems. . A way of improving blood sugar control is eating a healthy diet by: o  Eating less sugar and carbohydrates o  Increasing activity/exercise o  Talking with your doctor about reaching your blood sugar goals . High blood sugars (greater than 180 mg/dL) can raise your risk of infections and slow your recovery, so you will need to focus on controlling your diabetes during the weeks before surgery. . Make sure that the doctor who takes care of your diabetes knows about your planned surgery  including the date and location.  How do I manage my blood sugar before surgery? . Check your blood sugar at least 4 times a day, starting 2 days before surgery, to make sure that the level is not too high or low. o Check your blood sugar the morning of your surgery when you wake up and every 2 hours until you get to the Short Stay unit. . If your blood sugar is less than 70 mg/dL, you will need to treat for low blood sugar: o Do not take insulin. o Treat a low blood sugar (less than 70 mg/dL) with  cup of clear juice (cranberry or apple), 4 glucose tablets, OR glucose gel. Recheck blood sugar in 15 minutes after treatment (to make sure it is greater than 70 mg/dL). If your blood sugar is not greater than 70 mg/dL on recheck, call [phone] o  for further instructions. . Report your blood sugar to the short stay nurse when you get to Short Stay.  . If you are admitted to the hospital after surgery: o Your blood sugar will be checked by the staff and you will probably be given insulin after surgery (instead of oral diabetes medicines) to make sure you have good blood sugar levels. o The goal for blood sugar control after surgery is 80-180 mg/dL.               Do not wear jewelry, make-up or nail polish.  Do not wear lotions, powders, or perfumes, or deodorant.  Do not shave 48 hours prior to surgery.  Men may shave face and neck.  Do  not bring valuables to the hospital.  [facility] is not responsible for any belongings or valuables.  Contacts, dentures or bridgework may not be worn into surgery.  Leave your suitcase in the car.  After surgery it may be brought to your room.  For patients admitted to the hospital, discharge time will be determined by your treatment team.  Patients discharged the day of surgery will not be allowed to drive home.   Special instructions:    [facility]- Preparing For Surgery  Before surgery, you can play an important role. Because skin is  not sterile, your skin needs to be as free of germs as possible. You can reduce the number of germs on your skin by washing with CHG (chlorahexidine gluconate) Soap before surgery.  CHG is an antiseptic cleaner which kills germs and bonds with the skin to continue killing germs even after washing.  Please do not use if you have an allergy to CHG or antibacterial soaps. If your skin becomes reddened/irritated stop using the CHG.  Do not shave (including legs and underarms) for at least 48 hours prior to first CHG shower. It is OK to shave your face.  Please follow these instructions carefully.   1. Shower the NIGHT BEFORE SURGERY and the MORNING OF SURGERY with CHG.   2. If you chose to wash your hair, wash your hair first as usual with your normal shampoo.  3. After you shampoo, rinse your hair and body thoroughly to remove the shampoo.  4. Use CHG as you would any other liquid soap. You can apply CHG directly to the skin and wash gently with a scrungie or a clean washcloth.   5. Apply the CHG Soap to your body ONLY FROM THE NECK DOWN.  Do not use on open wounds or open sores. Avoid contact with your eyes, ears, mouth and genitals (private parts). Wash Face and genitals (private parts)  with your normal soap.  6. Wash thoroughly, paying special attention to the area where your surgery will be performed.  7. Thoroughly rinse your body with warm water from the neck down.  8. DO NOT shower/wash with your normal soap after using and rinsing off the CHG Soap.  9. Pat yourself dry with a CLEAN TOWEL.  10. Wear CLEAN PAJAMAS to bed the night before surgery, wear comfortable clothes the morning of surgery  11. Place CLEAN SHEETS on your bed the night of your first shower and DO NOT SLEEP WITH PETS.    Day of Surgery: Do not apply any deodorants/lotions. Please wear clean clothes to the hospital/surgery center.      Please read over the following fact sheets that you were given. Coughing  and Deep Breathing and Surgical Site Infection Prevention

## 2017-09-25 ENCOUNTER — Telehealth (INDEPENDENT_AMBULATORY_CARE_PROVIDER_SITE_OTHER): Payer: Self-pay | Admitting: Orthopedic Surgery

## 2017-09-25 NOTE — Telephone Encounter (Signed)
I tried calling her again. No answer. LMVM for her per Dr Marlou Sa We need to cancel her surgery because her hemoglobin A1c is 9.2. Elective surgery hemoglobin A1c should be less than 8 please inform her of same thanks

## 2017-09-25 NOTE — Telephone Encounter (Signed)
[name], see message.

## 2017-09-25 NOTE — Telephone Encounter (Signed)
Patient was called yesterday and she doesn't know from who. I see where she has surgery tomorrow but was just going to let y'all know she returned the call if you needed her. Please advise # [phone]

## 2017-09-25 NOTE — Telephone Encounter (Signed)
I called Mr. Effertz and advised that we were cancelling surgery due to her A1-C.  She asked me how she was supposed to get it below an 8, I advised that she should follow up with her PCP to discuss.

## 2017-09-26 ENCOUNTER — Ambulatory Visit (HOSPITAL_COMMUNITY): Admission: RE | Admit: 2017-09-26 | Payer: Medicare Other | Source: Ambulatory Visit | Admitting: Orthopedic Surgery

## 2017-09-26 ENCOUNTER — Encounter (HOSPITAL_COMMUNITY): Admission: RE | Payer: Self-pay | Source: Ambulatory Visit

## 2017-09-26 SURGERY — SHOULDER ARTHROSCOPY WITH SUBACROMIAL DECOMPRESSION, ROTATOR CUFF REPAIR AND BICEP TENDON REPAIR
Anesthesia: General | Site: Shoulder | Laterality: Right

## 2017-10-10 ENCOUNTER — Inpatient Hospital Stay (INDEPENDENT_AMBULATORY_CARE_PROVIDER_SITE_OTHER): Payer: Medicare Other | Admitting: Orthopedic Surgery

## 2017-10-13 ENCOUNTER — Encounter (INDEPENDENT_AMBULATORY_CARE_PROVIDER_SITE_OTHER): Payer: Self-pay | Admitting: Orthopedic Surgery

## 2017-10-13 ENCOUNTER — Ambulatory Visit (INDEPENDENT_AMBULATORY_CARE_PROVIDER_SITE_OTHER): Payer: Medicare Other | Admitting: Orthopedic Surgery

## 2017-10-13 DIAGNOSIS — M75121 Complete rotator cuff tear or rupture of right shoulder, not specified as traumatic: Secondary | ICD-10-CM

## 2017-10-13 NOTE — Progress Notes (Signed)
Office Visit Note   [name]           Date of Birth: [birth year]           MRN: [id number] Visit Date: 10/13/2017 Requested by: Lauren Ebbs, MD [address] [place], [place] [address] PCP: Lauren Ebbs, MD  Subjective: Chief Complaint  Patient presents with  . Right Shoulder - Follow-up, Pain    HPI: [name] is a patient with right shoulder pain.  She had an injection 09/02/2017.  She was scheduled for shoulder surgery but it was canceled due to her increased hemoglobin A1c.  She did have neck surgery done in December.  She now is on insulin to control her diabetes.  This is been going on for 2 weeks.  States that her shoulder pain has worsened.  She reports diminishing range of motion and increasing pain.  Denies any fevers or chills.              ROS: All systems reviewed are negative as they relate to the chief complaint within the history of present illness.  Patient denies  fevers or chills.   Assessment & Plan: Visit Diagnoses:  1. 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  The frozen shoulder definitely complicates management of the situation.  She is on insulin which will help her perioperative glucose management.  I do think that she is at slightly higher risk for infection complication due to her diabetes.  The sooner that we do the surgery the quicker she will get relief but the higher the risk is for infectious complication.  Later we wait stiffer the shoulder gets in the heart of the rehab will be.  She chooses the former as opposed to the latter in terms of risk assessment and risk taking.  Plan at this time is for right shoulder manipulation under anesthesia followed by biceps tenodesis and rotator cuff repair.  The risk and benefits are discussed including but not limited to infection shoulder stiffness with  prolonged rehabilitative time as well as the importance of using the CPM machine on a fairly constant basis within the first 2 weeks.  Patient understands that her shoulder pain will likely get worse before it gets better.  All questions answered  Follow-Up Instructions: No Follow-up on file.   Orders:  No orders of the defined types were placed in this encounter.  No orders of the defined types were placed in this encounter.     Procedures: No procedures performed   Clinical Data: No additional findings.  Objective: Vital Signs: There were no vitals taken for this visit.  Physical Exam:   Constitutional: Patient appears well-developed HEENT:  Head: Normocephalic Eyes:EOM are normal Neck: Normal range of motion Cardiovascular: Normal rate Pulmonary/chest: Effort normal Neurologic: Patient is alert Skin: Skin is warm Psychiatric: Patient has normal mood and affect    Ortho Exam: Orthopedic exam demonstrates good motor sensory function to the right hand.  Patient has about 20 degrees of external rotation at 15 degrees of abduction and her forward flexion and abduction is limited.  She does not really let me examine her as much as she did in the past.  I think that is because of the pain associated with this frozen shoulder.  Previous MRI scan shows retracted rotator cuff tear supraspinatus along with biceps  tendinopathy  Specialty Comments:  No specialty comments available.  Imaging: No results found.   PMFS History: Patient Active Problem List   Diagnosis Date Noted  . Complete tear of right rotator cuff 07/22/2017    Class: Chronic  . Retained orthopedic hardware 07/22/2017    Class: Chronic  . Cervical disc herniation 07/21/2017    Class: Acute  . Status post cervical spinal fusion 07/21/2017  . Chronic back pain    Past Medical History:  Diagnosis Date  . Anxiety   . Arthritis    rheumatoid , osteoarthritis  . Bronchitis   . Chronic back pain   .  Depression   . Diabetes mellitus   . Fatty liver   . Fibromyalgia   . GERD (gastroesophageal reflux disease)   . Headache   . Hypertension   . Pancreatitis   . Pneumonia     Family History  Problem Relation Age of Onset  . Hypertension Father   . Renal Disease Father   . Diabetes Mother   . Hypertension Mother   . Edema Mother   . Diabetes Sister   . Diabetes Brother     Past Surgical History:  Procedure Laterality Date  . ABDOMINAL HYSTERECTOMY    . ANTERIOR CERVICAL DECOMP/DISCECTOMY FUSION N/A 07/21/2017   Procedure: ANTERIOR CERVICAL DISCECTOMY AND FUSION C6-7, REMOVAL OF SCREW C6 PLATE, DEPUY ZERO-P IMPLANT, LOCAL BONE GRAFT, ALLOGRAFT BONE GRAFT, VIVIGEN;  Surgeon: Lauren Oto, MD;  Location: [facility];  Service: Orthopedics;  Laterality: N/A;  . BACK SURGERY     laminectomy  . BONE GRAFT HIP ILIAC CREST    . CARPAL TUNNEL RELEASE    . CERVICAL DISCECTOMY  07/21/2017  . CESAREAN SECTION    . CHOLECYSTECTOMY    . COLONOSCOPY    . frontal fusion    . neck fusion     patient reported   Social History   Occupational History  . Not on file  Tobacco Use  . Smoking status: Current Every Day Smoker    Packs/day: 1.00    Types: Cigarettes  . Smokeless tobacco: Never Used  Substance and Sexual Activity  . Alcohol use: No  . Drug use: Yes    Types: Marijuana    Comment: daily  . Sexual activity: Not on file

## 2017-10-29 ENCOUNTER — Other Ambulatory Visit (INDEPENDENT_AMBULATORY_CARE_PROVIDER_SITE_OTHER): Payer: Self-pay | Admitting: Orthopedic Surgery

## 2017-10-29 DIAGNOSIS — M75101 Unspecified rotator cuff tear or rupture of right shoulder, not specified as traumatic: Secondary | ICD-10-CM

## 2017-10-30 ENCOUNTER — Encounter (HOSPITAL_COMMUNITY): Payer: Self-pay | Admitting: *Deleted

## 2017-10-30 ENCOUNTER — Other Ambulatory Visit: Payer: Self-pay

## 2017-10-30 NOTE — Anesthesia Preprocedure Evaluation (Addendum)
Anesthesia Evaluation  Patient identified by MRN, date of birth, ID band Patient awake    Reviewed: Allergy & Precautions, NPO status , Patient's Chart, lab work & pertinent test results, reviewed documented beta blocker date and time   History of Anesthesia Complications Negative for: history of anesthetic complications  Airway Mallampati: II  TM Distance: >3 FB Neck ROM: Limited    Dental  (+) Teeth Intact, Dental Advisory Given   Pulmonary Current Smoker,    breath sounds clear to auscultation       Cardiovascular hypertension, Pt. on medications and Pt. on home beta blockers  Rhythm:Regular Rate:Normal     Neuro/Psych Anxiety Depression  Neuromuscular disease    GI/Hepatic GERD  Medicated and Controlled,(+)     substance abuse  marijuana use,   Endo/Other  diabetes, Type 2, Oral Hypoglycemic Agents, Insulin Dependent  Renal/GU      Musculoskeletal  (+) Arthritis , Osteoarthritis and Rheumatoid disorders,  Fibromyalgia -Chronic back pain   Abdominal   Peds  Hematology   Anesthesia Other Findings   Reproductive/Obstetrics                            Anesthesia Physical  Anesthesia Plan  ASA: III  Anesthesia Plan: General   Post-op Pain Management:  Regional for Post-op pain   Induction: Intravenous  PONV Risk Score and Plan: 3 and Treatment may vary due to age or medical condition, Ondansetron, Midazolam, Scopolamine patch - Pre-op and Dexamethasone  Airway Management Planned: Oral ETT  Additional Equipment: None  Intra-op Plan:   Post-operative Plan: Extubation in OR  Informed Consent: I have reviewed the patients History and Physical, chart, labs and discussed the procedure including the risks, benefits and alternatives for the proposed anesthesia with the patient or authorized representative who has indicated his/her understanding and acceptance.   Dental advisory  given  Plan Discussed with: CRNA and Anesthesiologist  Anesthesia Plan Comments:        Anesthesia Quick Evaluation

## 2017-10-30 NOTE — Progress Notes (Signed)
Pt denies SOB, chest pain, and being under the care of a cardiologist. Pt denies having an echo and stated that a stress test was > 5 years ago. Pt made aware to stop taking, and herbal medications. Do not take any NSAIDs ie: Ibuprofen, Advil, Naproxen (Aleve), Meloxicam (Mobic), Motrin, BC and Goody Powder. Pt made aware to take 5 units of Lantus insulin tonight.  Pt made aware to not take Metformin on DOS. Pt made aware to check BG every 2 hours prior to arrival to hospital on DOS. Pt made aware to treat a BG < 70 with  4 ounces of apple  juice, wait 15 minutes after intervention to recheck BG, if BG remains < 70, call Short Stay unit to speak with a nurse. Pt verbalized understanding of all pre-op instructions.

## 2017-10-31 ENCOUNTER — Other Ambulatory Visit (INDEPENDENT_AMBULATORY_CARE_PROVIDER_SITE_OTHER): Payer: Self-pay

## 2017-10-31 ENCOUNTER — Ambulatory Visit (HOSPITAL_COMMUNITY)
Admission: RE | Admit: 2017-10-31 | Discharge: 2017-10-31 | Disposition: A | Discharge status: Home / Self Care | Payer: Medicare Other | Source: Ambulatory Visit | Attending: Orthopedic Surgery | Admitting: Orthopedic Surgery

## 2017-10-31 ENCOUNTER — Ambulatory Visit (HOSPITAL_COMMUNITY): Payer: Medicare Other | Admitting: Anesthesiology

## 2017-10-31 ENCOUNTER — Encounter (HOSPITAL_COMMUNITY): Payer: Self-pay | Admitting: *Deleted

## 2017-10-31 ENCOUNTER — Other Ambulatory Visit: Payer: Self-pay

## 2017-10-31 ENCOUNTER — Telehealth (INDEPENDENT_AMBULATORY_CARE_PROVIDER_SITE_OTHER): Payer: Self-pay | Admitting: Radiology

## 2017-10-31 ENCOUNTER — Telehealth (INDEPENDENT_AMBULATORY_CARE_PROVIDER_SITE_OTHER): Payer: Self-pay

## 2017-10-31 ENCOUNTER — Encounter (HOSPITAL_COMMUNITY)
Admission: RE | Disposition: A | Discharge status: Home / Self Care | Payer: Self-pay | Source: Ambulatory Visit | Attending: Orthopedic Surgery

## 2017-10-31 DIAGNOSIS — M7501 Adhesive capsulitis of right shoulder: Secondary | ICD-10-CM | POA: Insufficient documentation

## 2017-10-31 DIAGNOSIS — F329 Major depressive disorder, single episode, unspecified: Secondary | ICD-10-CM | POA: Diagnosis not present

## 2017-10-31 DIAGNOSIS — K219 Gastro-esophageal reflux disease without esophagitis: Secondary | ICD-10-CM | POA: Diagnosis not present

## 2017-10-31 DIAGNOSIS — Z794 Long term (current) use of insulin: Secondary | ICD-10-CM | POA: Insufficient documentation

## 2017-10-31 DIAGNOSIS — M7521 Bicipital tendinitis, right shoulder: Secondary | ICD-10-CM | POA: Diagnosis not present

## 2017-10-31 DIAGNOSIS — Z79899 Other long term (current) drug therapy: Secondary | ICD-10-CM | POA: Diagnosis not present

## 2017-10-31 DIAGNOSIS — E119 Type 2 diabetes mellitus without complications: Secondary | ICD-10-CM | POA: Insufficient documentation

## 2017-10-31 DIAGNOSIS — M75101 Unspecified rotator cuff tear or rupture of right shoulder, not specified as traumatic: Secondary | ICD-10-CM | POA: Diagnosis present

## 2017-10-31 DIAGNOSIS — M1990 Unspecified osteoarthritis, unspecified site: Secondary | ICD-10-CM | POA: Diagnosis not present

## 2017-10-31 DIAGNOSIS — Z7951 Long term (current) use of inhaled steroids: Secondary | ICD-10-CM | POA: Diagnosis not present

## 2017-10-31 DIAGNOSIS — M069 Rheumatoid arthritis, unspecified: Secondary | ICD-10-CM | POA: Diagnosis not present

## 2017-10-31 DIAGNOSIS — G8929 Other chronic pain: Secondary | ICD-10-CM | POA: Diagnosis not present

## 2017-10-31 DIAGNOSIS — M797 Fibromyalgia: Secondary | ICD-10-CM | POA: Diagnosis not present

## 2017-10-31 DIAGNOSIS — I10 Essential (primary) hypertension: Secondary | ICD-10-CM | POA: Diagnosis not present

## 2017-10-31 DIAGNOSIS — F17210 Nicotine dependence, cigarettes, uncomplicated: Secondary | ICD-10-CM | POA: Diagnosis not present

## 2017-10-31 DIAGNOSIS — F419 Anxiety disorder, unspecified: Secondary | ICD-10-CM | POA: Insufficient documentation

## 2017-10-31 DIAGNOSIS — M75121 Complete rotator cuff tear or rupture of right shoulder, not specified as traumatic: Secondary | ICD-10-CM | POA: Diagnosis not present

## 2017-10-31 DIAGNOSIS — Z791 Long term (current) use of non-steroidal anti-inflammatories (NSAID): Secondary | ICD-10-CM | POA: Diagnosis not present

## 2017-10-31 DIAGNOSIS — M7541 Impingement syndrome of right shoulder: Secondary | ICD-10-CM | POA: Diagnosis not present

## 2017-10-31 HISTORY — PX: SHOULDER ARTHROSCOPY WITH SUBACROMIAL DECOMPRESSION, ROTATOR CUFF REPAIR AND BICEP TENDON REPAIR: SHX5687

## 2017-10-31 HISTORY — DX: Unspecified rotator cuff tear or rupture of right shoulder, not specified as traumatic: M75.101

## 2017-10-31 LAB — GLUCOSE, CAPILLARY
GLUCOSE-CAPILLARY: 212 mg/dL — AB (ref 65–99)
GLUCOSE-CAPILLARY: 194 mg/dL — AB (ref 65–99)
Glucose-Capillary: 266 mg/dL — ABNORMAL HIGH (ref 65–99)

## 2017-10-31 LAB — CBC
HEMATOCRIT: 40.1 % (ref 36.0–46.0)
Hemoglobin: 13.8 g/dL (ref 12.0–15.0)
MCH: 29.4 pg (ref 26.0–34.0)
MCHC: 34.4 g/dL (ref 30.0–36.0)
MCV: 85.3 fL (ref 78.0–100.0)
PLATELETS: 312 10*3/uL (ref 150–400)
RBC: 4.7 MIL/uL (ref 3.87–5.11)
RDW: 14.3 % (ref 11.5–15.5)
WBC: 12.4 10*3/uL — AB (ref 4.0–10.5)

## 2017-10-31 SURGERY — SHOULDER ARTHROSCOPY WITH SUBACROMIAL DECOMPRESSION, ROTATOR CUFF REPAIR AND BICEP TENDON REPAIR
Anesthesia: General | Site: Shoulder | Laterality: Right

## 2017-10-31 MED ORDER — ROCURONIUM BROMIDE 100 MG/10ML IV SOLN
INTRAVENOUS | Status: DC | PRN
  Administered 2017-10-31: 10 mg via INTRAVENOUS
  Administered 2017-10-31: 50 mg via INTRAVENOUS

## 2017-10-31 MED ORDER — SODIUM CHLORIDE 0.9 % IR SOLN
Status: DC | PRN
  Administered 2017-10-31: 3000 mL
  Administered 2017-10-31: 1000 mL

## 2017-10-31 MED ORDER — DEXAMETHASONE SODIUM PHOSPHATE 10 MG/ML IJ SOLN
INTRAMUSCULAR | Status: DC | PRN
  Administered 2017-10-31: 10 mg via INTRAVENOUS

## 2017-10-31 MED ORDER — OXYCODONE HCL 5 MG PO TABS: 5.0000 mg | ORAL_TABLET | Freq: Once | ORAL | Status: DC | PRN

## 2017-10-31 MED ORDER — PHENYLEPHRINE HCL 10 MG/ML IJ SOLN
INTRAMUSCULAR | Status: DC | PRN
  Administered 2017-10-31: 40 ug/min via INTRAVENOUS

## 2017-10-31 MED ORDER — MIDAZOLAM HCL 2 MG/2ML IJ SOLN
INTRAMUSCULAR | Status: AC
  Filled 2017-10-31: qty 2

## 2017-10-31 MED ORDER — SUGAMMADEX SODIUM 200 MG/2ML IV SOLN
INTRAVENOUS | Status: AC
  Filled 2017-10-31: qty 2

## 2017-10-31 MED ORDER — PROMETHAZINE HCL 25 MG/ML IJ SOLN: 6.2500 mg | INTRAMUSCULAR | Status: DC | PRN

## 2017-10-31 MED ORDER — MIDAZOLAM HCL 5 MG/5ML IJ SOLN
INTRAMUSCULAR | Status: DC | PRN
  Administered 2017-10-31: 2 mg via INTRAVENOUS

## 2017-10-31 MED ORDER — PROPOFOL 10 MG/ML IV BOLUS
INTRAVENOUS | Status: AC
  Filled 2017-10-31: qty 40

## 2017-10-31 MED ORDER — EPINEPHRINE PF 1 MG/ML IJ SOLN
INTRAMUSCULAR | Status: DC | PRN
  Administered 2017-10-31: .1 mL

## 2017-10-31 MED ORDER — SODIUM CHLORIDE 0.9 % IJ SOLN
INTRAMUSCULAR | Status: DC | PRN
  Administered 2017-10-31: 50 mL

## 2017-10-31 MED ORDER — SCOPOLAMINE 1 MG/3DAYS TD PT72
MEDICATED_PATCH | TRANSDERMAL | Status: DC | PRN
  Administered 2017-10-31: 1 via TRANSDERMAL

## 2017-10-31 MED ORDER — CEFAZOLIN SODIUM-DEXTROSE 2-4 GM/100ML-% IV SOLN
2.0000 g | INTRAVENOUS | Status: AC
  Administered 2017-10-31: 2 g via INTRAVENOUS
  Filled 2017-10-31: qty 100

## 2017-10-31 MED ORDER — FENTANYL CITRATE (PF) 100 MCG/2ML IJ SOLN
INTRAMUSCULAR | Status: DC | PRN
  Administered 2017-10-31 (×3): 50 ug via INTRAVENOUS
  Administered 2017-10-31: 100 ug via INTRAVENOUS

## 2017-10-31 MED ORDER — PROPOFOL 10 MG/ML IV BOLUS
INTRAVENOUS | Status: DC | PRN
  Administered 2017-10-31: 100 mg via INTRAVENOUS
  Administered 2017-10-31: 200 mg via INTRAVENOUS

## 2017-10-31 MED ORDER — CHLORHEXIDINE GLUCONATE 4 % EX LIQD: 60.0000 mL | Freq: Once | CUTANEOUS | Status: DC

## 2017-10-31 MED ORDER — EPINEPHRINE PF 1 MG/ML IJ SOLN
INTRAMUSCULAR | Status: AC
  Filled 2017-10-31: qty 1

## 2017-10-31 MED ORDER — BUPIVACAINE-EPINEPHRINE (PF) 0.5% -1:200000 IJ SOLN
INTRAMUSCULAR | Status: DC | PRN
  Administered 2017-10-31: 30 mL via PERINEURAL

## 2017-10-31 MED ORDER — LIDOCAINE HCL (CARDIAC) 20 MG/ML IV SOLN
INTRAVENOUS | Status: AC
  Filled 2017-10-31: qty 5

## 2017-10-31 MED ORDER — FENTANYL CITRATE (PF) 250 MCG/5ML IJ SOLN
INTRAMUSCULAR | Status: AC
  Filled 2017-10-31: qty 5

## 2017-10-31 MED ORDER — INSULIN REGULAR HUMAN 100 UNIT/ML IJ SOLN
5.0000 [IU] | INTRAMUSCULAR | Status: AC
  Administered 2017-10-31: 5 [IU] via SUBCUTANEOUS
  Filled 2017-10-31: qty 0.05

## 2017-10-31 MED ORDER — ONDANSETRON HCL 4 MG/2ML IJ SOLN
INTRAMUSCULAR | Status: AC
  Filled 2017-10-31: qty 2

## 2017-10-31 MED ORDER — LIDOCAINE HCL (CARDIAC) 20 MG/ML IV SOLN
INTRAVENOUS | Status: DC | PRN
  Administered 2017-10-31: 40 mg via INTRAVENOUS

## 2017-10-31 MED ORDER — LACTATED RINGERS IV SOLN
INTRAVENOUS | Status: DC
  Administered 2017-10-31 (×2): via INTRAVENOUS

## 2017-10-31 MED ORDER — ROCURONIUM BROMIDE 10 MG/ML (PF) SYRINGE
PREFILLED_SYRINGE | INTRAVENOUS | Status: AC
  Filled 2017-10-31: qty 5

## 2017-10-31 MED ORDER — OXYCODONE HCL 5 MG/5ML PO SOLN: 5.0000 mg | Freq: Once | ORAL | Status: DC | PRN

## 2017-10-31 MED ORDER — SUGAMMADEX SODIUM 200 MG/2ML IV SOLN
INTRAVENOUS | Status: DC | PRN
  Administered 2017-10-31: 200 mg via INTRAVENOUS

## 2017-10-31 MED ORDER — ONDANSETRON HCL 4 MG/2ML IJ SOLN
INTRAMUSCULAR | Status: DC | PRN
  Administered 2017-10-31: 4 mg via INTRAVENOUS

## 2017-10-31 MED ORDER — FENTANYL CITRATE (PF) 100 MCG/2ML IJ SOLN: 25.0000 ug | INTRAMUSCULAR | Status: DC | PRN

## 2017-10-31 MED ORDER — DEXAMETHASONE SODIUM PHOSPHATE 10 MG/ML IJ SOLN
INTRAMUSCULAR | Status: AC
  Filled 2017-10-31: qty 1

## 2017-10-31 MED ORDER — EPHEDRINE 5 MG/ML INJ
INTRAVENOUS | Status: AC
  Filled 2017-10-31: qty 10

## 2017-10-31 MED ORDER — BUPIVACAINE HCL (PF) 0.5 % IJ SOLN
INTRAMUSCULAR | Status: AC
  Filled 2017-10-31: qty 30

## 2017-10-31 MED ORDER — PHENYLEPHRINE 40 MCG/ML (10ML) SYRINGE FOR IV PUSH (FOR BLOOD PRESSURE SUPPORT)
PREFILLED_SYRINGE | INTRAVENOUS | Status: AC
  Filled 2017-10-31: qty 10

## 2017-10-31 SURGICAL SUPPLY — 68 items
ANCHOR PUSHLOCK PEEK 3.5X19.5 (Anchor) ×2 IMPLANT
ANCHOR SUT BIOCOMP CORKSREW (Anchor) ×4 IMPLANT
ANCHOR SUT SWIVELLOK BIO (Anchor) ×2 IMPLANT
BLADE CUTTER GATOR 3.5 (BLADE) ×2 IMPLANT
BLADE GREAT WHITE 4.2 (BLADE) IMPLANT
BLADE SURG 11 STRL SS (BLADE) ×2 IMPLANT
COVER SURGICAL LIGHT HANDLE (MISCELLANEOUS) ×2 IMPLANT
DRAPE INCISE IOBAN 66X45 STRL (DRAPES) ×2 IMPLANT
DRAPE STERI 35X30 U-POUCH (DRAPES) ×2 IMPLANT
DRAPE U-SHAPE 47X51 STRL (DRAPES) ×4 IMPLANT
DRSG TEGADERM 4X4.75 (GAUZE/BANDAGES/DRESSINGS) ×6 IMPLANT
DURAPREP 26ML APPLICATOR (WOUND CARE) ×4 IMPLANT
ELECT REM PT RETURN 9FT ADLT (ELECTROSURGICAL) ×2
ELECTRODE REM PT RTRN 9FT ADLT (ELECTROSURGICAL) ×1 IMPLANT
FILTER STRAW FLUID ASPIR (MISCELLANEOUS) ×2 IMPLANT
GAUZE SPONGE 4X4 12PLY STRL (GAUZE/BANDAGES/DRESSINGS) ×2 IMPLANT
GAUZE SPONGE 4X4 12PLY STRL LF (GAUZE/BANDAGES/DRESSINGS) ×2 IMPLANT
GAUZE XEROFORM 1X8 LF (GAUZE/BANDAGES/DRESSINGS) ×2 IMPLANT
GLOVE BIOGEL PI IND STRL 6.5 (GLOVE) ×1 IMPLANT
GLOVE BIOGEL PI IND STRL 7.5 (GLOVE) IMPLANT
GLOVE BIOGEL PI IND STRL 8 (GLOVE) ×1 IMPLANT
GLOVE BIOGEL PI INDICATOR 6.5 (GLOVE) ×1
GLOVE BIOGEL PI INDICATOR 7.5 (GLOVE)
GLOVE BIOGEL PI INDICATOR 8 (GLOVE) ×1
GLOVE ECLIPSE 7.0 STRL STRAW (GLOVE) IMPLANT
GLOVE SURG ORTHO 8.0 STRL STRW (GLOVE) ×2 IMPLANT
GLOVE SURG SS PI 6.5 STRL IVOR (GLOVE) ×4 IMPLANT
GLOVE SURG SS PI 8.0 STRL IVOR (GLOVE) ×6 IMPLANT
GOWN STRL REUS W/ TWL LRG LVL3 (GOWN DISPOSABLE) ×3 IMPLANT
GOWN STRL REUS W/TWL LRG LVL3 (GOWN DISPOSABLE) ×3
KIT BASIN OR (CUSTOM PROCEDURE TRAY) ×2 IMPLANT
KIT ROOM TURNOVER OR (KITS) ×2 IMPLANT
MANIFOLD NEPTUNE II (INSTRUMENTS) ×2 IMPLANT
NDL SUT 6 .5 CRC .975X.05 MAYO (NEEDLE) ×1 IMPLANT
NEEDLE HYPO 25X1 1.5 SAFETY (NEEDLE) IMPLANT
NEEDLE MAYO TAPER (NEEDLE) ×1
NEEDLE SCORPION MULTI FIRE (NEEDLE) ×2 IMPLANT
NEEDLE SPNL 18GX3.5 QUINCKE PK (NEEDLE) ×2 IMPLANT
NS IRRIG 1000ML POUR BTL (IV SOLUTION) ×2 IMPLANT
PACK SHOULDER (CUSTOM PROCEDURE TRAY) ×2 IMPLANT
PAD ARMBOARD 7.5X6 YLW CONV (MISCELLANEOUS) ×2 IMPLANT
PUSHLOCK PEEK 4.5X24 (Orthopedic Implant) ×2 IMPLANT
RESTRAINT HEAD UNIVERSAL NS (MISCELLANEOUS) ×2 IMPLANT
SET ARTHROSCOPY TUBING (MISCELLANEOUS) ×1
SET ARTHROSCOPY TUBING LN (MISCELLANEOUS) ×1 IMPLANT
SLING ARM IMMOBILIZER LRG (SOFTGOODS) IMPLANT
SPONGE LAP 4X18 X RAY DECT (DISPOSABLE) ×4 IMPLANT
STRIP CLOSURE SKIN 1/2X4 (GAUZE/BANDAGES/DRESSINGS) ×2 IMPLANT
SUCTION FRAZIER HANDLE 10FR (MISCELLANEOUS) ×1
SUCTION TUBE FRAZIER 10FR DISP (MISCELLANEOUS) ×1 IMPLANT
SUT ETHILON 3 0 PS 1 (SUTURE) ×4 IMPLANT
SUT FIBERWIRE #2 38 T-5 BLUE (SUTURE) ×2
SUT MNCRL AB 3-0 PS2 18 (SUTURE) ×2 IMPLANT
SUT VIC AB 0 CT1 27 (SUTURE) ×3
SUT VIC AB 0 CT1 27XBRD ANBCTR (SUTURE) ×3 IMPLANT
SUT VIC AB 1 CT1 27 (SUTURE)
SUT VIC AB 1 CT1 27XBRD ANBCTR (SUTURE) IMPLANT
SUT VIC AB 2-0 CT1 27 (SUTURE) ×1
SUT VIC AB 2-0 CT1 TAPERPNT 27 (SUTURE) ×1 IMPLANT
SUT VICRYL 0 UR6 27IN ABS (SUTURE) ×12 IMPLANT
SUTURE FIBERWR #2 38 T-5 BLUE (SUTURE) ×1 IMPLANT
SYR 20CC LL (SYRINGE) ×4 IMPLANT
SYR 3ML LL SCALE MARK (SYRINGE) IMPLANT
SYR TB 1ML LUER SLIP (SYRINGE) ×2 IMPLANT
TOWEL OR 17X24 6PK STRL BLUE (TOWEL DISPOSABLE) ×2 IMPLANT
TOWEL OR 17X26 10 PK STRL BLUE (TOWEL DISPOSABLE) ×2 IMPLANT
WAND HAND CNTRL MULTIVAC 90 (MISCELLANEOUS) ×2 IMPLANT
WATER STERILE IRR 1000ML POUR (IV SOLUTION) ×2 IMPLANT

## 2017-10-31 NOTE — Progress Notes (Signed)
Dr. Marlou Sa notified that CMET was hemolyzed.

## 2017-10-31 NOTE — Anesthesia Procedure Notes (Signed)
Anesthesia Regional Block: Interscalene brachial plexus block   Pre-Anesthetic Checklist: ,, timeout performed, Correct Patient, Correct Site, Correct Laterality, Correct Procedure, Correct Position, site marked, Risks and benefits discussed,  Surgical consent,  Pre-op evaluation,  At surgeon's request and post-op pain management  Laterality: Right  Prep: chloraprep       Needles:  Injection technique: Single-shot  Needle Type: Echogenic Needle     Needle Length: 5cm  Needle Gauge: 21     Additional Needles:   Narrative:  Start time: 10/31/2017 7:08 AM End time: 10/31/2017 7:12 AM Injection made incrementally with aspirations every 5 mL.  Performed by: Personally  Anesthesiologist: Audry Pili, MD  Additional Notes: No pain on injection. No increased resistance to injection. Injection made in 5cc increments. Good needle visualization. Patient tolerated the procedure well.

## 2017-10-31 NOTE — Telephone Encounter (Signed)
noted 

## 2017-10-31 NOTE — Anesthesia Procedure Notes (Addendum)
Procedure Name: Intubation Date/Time: 10/31/2017 7:50 AM Performed by: Scheryl Darter, CRNA Pre-anesthesia Checklist: Patient identified, Emergency Drugs available, Suction available and Patient being monitored Patient Re-evaluated:Patient Re-evaluated prior to induction Oxygen Delivery Method: Circle System Utilized Preoxygenation: Pre-oxygenation with 100% oxygen Induction Type: IV induction Ventilation: Mask ventilation without difficulty Laryngoscope Size: Miller and 2 Grade View: Grade II Tube type: Oral Tube size: 7.5 mm Number of attempts: 1 Airway Equipment and Method: Stylet and Oral airway Placement Confirmation: ETT inserted through vocal cords under direct vision,  positive ETCO2 and breath sounds checked- equal and bilateral Secured at: 22 cm Tube secured with: Tape Dental Injury: Teeth and Oropharynx as per pre-operative assessment

## 2017-10-31 NOTE — Telephone Encounter (Signed)
Kisha from [facility] called to inform Physical Therapy will start Monday 11/03/17 with patient.

## 2017-10-31 NOTE — Transfer of Care (Signed)
Immediate Anesthesia Transfer of Care Note  [name]  Procedure(s) Performed: RIGHT SHOULDER ARTHROSCOPY, MANIPULATION UNDER ANESTHESIA, BICEPS TENODESIS, AND MINI OPEN ROTATOR CUFF TEAR REPAIR (Right Shoulder)  Patient Location: PACU  Anesthesia Type:General and Regional  Level of Consciousness: awake, alert , oriented and sedated  Airway & Oxygen Therapy: Patient Spontanous Breathing and Patient connected to face mask oxygen  Post-op Assessment: Report given to RN, Post -op Vital signs reviewed and stable and Patient moving all extremities  Post vital signs: Reviewed and stable  Last Vitals:  Vitals Value Taken Time  BP 101/87 10/31/2017 10:38 AM  Temp 36.3 C 10/31/2017 10:38 AM  Pulse 111 10/31/2017 10:41 AM  Resp 23 10/31/2017 10:41 AM  SpO2 98 % 10/31/2017 10:41 AM  Vitals shown include unvalidated device data.  Last Pain:  Vitals:   10/31/17 0618  TempSrc:   PainSc: 6       Patients Stated Pain Goal: 3 (58/09/98 3382)  Complications: No apparent anesthesia complications

## 2017-10-31 NOTE — Brief Op Note (Signed)
10/31/2017  10:20 AM  PATIENT:  [name]  52 y.o. female  PRE-OPERATIVE DIAGNOSIS:  Right Shoulder Frozen Shoulder, Rotator Cuff Tear, Biceps Tendonitis  POST-OPERATIVE DIAGNOSIS:  Right Shoulder Frozen Shoulder, Rotator Cuff Tear, Biceps biceps tendinitis   PROCEDURE:  Procedure(s): RIGHT SHOULDER ARTHROSCOPY, MANIPULATION UNDER ANESTHESIA, BICEPS TENODESIS, AND MINI OPEN ROTATOR CUFF TEAR REPAIR  SURGEON:  Surgeon(s): Marlou Sa, Tonna Corner, MD  ASSISTANT: Ky Barban RNFA  ANESTHESIA:   general  EBL: 25 ml    Total I/O In: 1400 [I.V.:1400] Out: -   BLOOD ADMINISTERED: none  DRAINS: none   LOCAL MEDICATIONS USED:  none  SPECIMEN:  No Specimen  COUNTS:  YES  TOURNIQUET:  * No tourniquets in log *  DICTATION: .Other Dictation: Dictation Number [id number]  PLAN OF CARE: Discharge to home after PACU  PATIENT DISPOSITION:  PACU - hemodynamically stable

## 2017-10-31 NOTE — Op Note (Signed)
NAME:  [name], [name] NO.:  [id number]  MEDICAL RECORD NO.:  [id number]  LOCATION:  [facility]                         FACILITY:  [facility]  PHYSICIAN:  Anderson Malta, M.D.    DATE OF BIRTH:  [birth year]  DATE OF PROCEDURE: DATE OF DISCHARGE:                              OPERATIVE REPORT   PREOPERATIVE DIAGNOSES:  Right shoulder adhesive capsulitis, rotator cuff tear, biceps tendinitis.  POSTOPERATIVE DIAGNOSES:  Right shoulder adhesive capsulitis, rotator cuff tear, biceps tendinitis.  PROCEDURE:  Right shoulder manipulation under anesthesia, rotator interval release with debridement, biceps tenodesis and rotator cuff repair.  SURGEON:  Anderson Malta, M.D.  ASSISTANT:  Ky Barban, RNFA.  INDICATIONS:  [name] is a 52 year old patient with right shoulder pain and stiffness.  She has fairly miserable in terms of her pain and restricted motion.  She has failed conservative management, presents for operative management after explanation of risks and benefits.  MRI scan does show biceps tendinitis as well as a significant rotator cuff tear.  DESCRIPTION OF PROCEDURE:  The patient was brought to the operating room where general anesthetic was induced.  Preoperative antibiotics were administered.  Time-out was called.  Right shoulder was manipulated under anesthesia.  She did have adhesions, which prevented forward flexion beyond about 120.  The arm was gently manipulated into full forward flexion and abduction with some release of scar tissue and capsule.  Following manipulation, the patient was able to achieve about 50 degrees of external rotation, 120 of abduction and 180 of forward flexion passively.  The patient was placed in the beach-chair position with the head in neutral position.  Right shoulder was prescrubbed with alcohol and Betadine, allowed to air dry, prepped with DuraPrep solution and draped in a sterile manner.  Charlie Pitter was used to cover the  operative field.  Solution of saline and epinephrine injected into the subacromial space.  Posterior portal created 2 cm medial and inferior to the posterolateral margin of the acromion.  Diagnostic arthroscopy was performed.  The patient had significant synovitis within the rotator interval.  The rotator interval was released.  Biceps tendon also had tendinitis and then was released.  Rotator cuff tear was visualized.  At this time, the glenohumeral articular surfaces were intact.  Anterior portal was created under direct visualization to facilitate rotator interval release with the ArthroCare wand.  At this time, the portals were closed.  An incision was made off the anterolateral margin of the acromion.  Deltoid split between the anterior and middle raphe and measured distance of 4 cm.  Biceps tendon was then tenodesed into the bicipital groove using SwiveLock Arthrex anchor.  Tendon was then rotated, the subacromial decompression was then performed with partial acromioplasty.  CA ligament was not fully released off the clavicle.  At this time, bursectomy was completed.  Rotator cuff tear was mobilized and tagging sutures of 0 Vicryl were placed.  Reasonable mobilization was achieved, but there was still tension on the retracted tear once it was in position.  Footprint was prepared using a rongeur.  Two 5.5 corkscrew suture anchors were placed from Arthrex.  Four tapes from each of these were then passed  through the mobilized rotator cuff tendon. The lesion was then tied and half of the ends were placed into one PushLock, the other half were placed in another PushLock, which were then crossed in order to present a mattress-type compression of the rotator cuff tear onto the bone.  At this time, good fixation was achieved.  Arm was taken through a range of motion and found to have no pinching.  Thorough irrigation was performed and the deltoid split was closed using #1 Vicryl followed by  0 Vicryl, 2-0 Vicryl and 3-0 Monocryl.  The patient tolerated the procedure well without immediate complications, transferred to the recovery room in stable condition. Impervious dressing was placed.     Anderson Malta, M.D.     GSD/MEDQ  D:  10/31/2017  T:  10/31/2017  Job:  [id number]

## 2017-10-31 NOTE — Telephone Encounter (Signed)
HHPT for right shoulder order through [facility]. All information faxed to [phone]. Called and sw Sonia Side advised they would accept referral. Order, demo sheet and last office visit faxed per request. Pt is having surgery today right shoulder scope, MUA, biceps tenodesis open RTC repair.

## 2017-10-31 NOTE — H&P (Signed)
[name] is an 52 y.o. female.   Chief Complaint: Right shoulder pain HPI: [name] is a 52 year old female with fairly debilitating right shoulder pain.  When she was evaluated in clinic she was found to have restricted range of motion as well as MRI scan which showed rotator cuff tearing.  She has failed conservative measures and her shoulder pain is getting worse.  She does have a history of having a neck fusion.  She presents for surgical management after isolation of risks and benefits.  Past Medical History:  Diagnosis Date  . Anxiety   . Arthritis    rheumatoid , osteoarthritis  . Bronchitis   . Chronic back pain   . Depression   . Diabetes mellitus   . Fatty liver   . Fibromyalgia   . GERD (gastroesophageal reflux disease)   . Headache   . Hypertension   . Pancreatitis   . Pneumonia   . Rotator cuff tear, right     Past Surgical History:  Procedure Laterality Date  . ABDOMINAL HYSTERECTOMY    . ANTERIOR CERVICAL DECOMP/DISCECTOMY FUSION N/A 07/21/2017   Procedure: ANTERIOR CERVICAL DISCECTOMY AND FUSION C6-7, REMOVAL OF SCREW C6 PLATE, DEPUY ZERO-P IMPLANT, LOCAL BONE GRAFT, ALLOGRAFT BONE GRAFT, VIVIGEN;  Surgeon: Jessy Oto, MD;  Location: [place];  Service: Orthopedics;  Laterality: N/A;  . BACK SURGERY     laminectomy  . BONE GRAFT HIP ILIAC CREST    . CARPAL TUNNEL RELEASE    . CERVICAL DISCECTOMY  07/21/2017  . CESAREAN SECTION    . CHOLECYSTECTOMY    . COLONOSCOPY    . frontal fusion    . neck fusion     patient reported    Family History  Problem Relation Age of Onset  . Hypertension Father   . Renal Disease Father   . Diabetes Mother   . Hypertension Mother   . Edema Mother   . Diabetes Sister   . Diabetes Brother    Social History:  reports that she has been smoking cigarettes.  She has been smoking about 0.50 packs per day. She has never used smokeless tobacco. She reports that she has current or past drug history. Drug: Marijuana. She reports  that she does not drink alcohol.  Allergies: No Known Allergies  Medications Prior to Admission  Medication Sig Dispense Refill  . acetaminophen (TYLENOL) 500 MG tablet Take 500 mg by mouth every 6 (six) hours as needed for moderate pain or headache.    . albuterol (PROVENTIL HFA;VENTOLIN HFA) 108 (90 BASE) MCG/ACT inhaler Inhale 2 puffs into the lungs every 4 (four) hours as needed for wheezing.    Marland Kitchen albuterol (PROVENTIL) (2.5 MG/3ML) 0.083% nebulizer solution Take 2.5 mg by nebulization every 6 (six) hours as needed for wheezing or shortness of breath.    . ALPRAZolam (XANAX) 0.5 MG tablet Take 0.5 mg by mouth daily as needed for anxiety.     . AZOR 5-40 MG per tablet Take 1 tablet by mouth daily.     . cyclobenzaprine (FLEXERIL) 10 MG tablet Take 1 tablet (10 mg total) by mouth 2 (two) times daily as needed for muscle spasms. (Patient taking differently: Take 10 mg by mouth at bedtime as needed for muscle spasms. ) 12 tablet 0  . fluticasone (FLONASE) 50 MCG/ACT nasal spray Place 2 sprays into both nostrils daily as needed for allergies or rhinitis.     Marland Kitchen gabapentin (NEURONTIN) 100 MG capsule Take 1 capsule (100 mg  total) by mouth at bedtime. 30 capsule 1  . Hypromellose (ARTIFICIAL TEARS OP) Apply 1-2 drops to eye daily as needed (for dry eyes).     Marland Kitchen ibuprofen (ADVIL,MOTRIN) 200 MG tablet Take 500 mg by mouth every 8 (eight) hours as needed for headache or moderate pain.     Marland Kitchen insulin glargine (LANTUS) 100 unit/mL SOPN Inject 10 Units into the skin at bedtime.    . meloxicam (MOBIC) 7.5 MG tablet Take 7.5 mg by mouth 2 (two) times daily as needed for pain.    . Menthol-Methyl Salicylate (MUSCLE RUB EX) Apply 1 application topically daily as needed (for pain).     . metFORMIN (GLUCOPHAGE) 1000 MG tablet Take 1,000 mg by mouth 2 (two) times daily.  0  . methocarbamol (ROBAXIN) 500 MG tablet Take 1 tablet (500 mg total) by mouth every 8 (eight) hours as needed for muscle spasms. (Patient taking  differently: Take 500 mg by mouth every 6 (six) hours as needed for muscle spasms. ) 50 tablet 0  . metoprolol succinate (TOPROL-XL) 50 MG 24 hr tablet Take 50 mg by mouth daily.  0  . simvastatin (ZOCOR) 20 MG tablet Take 20 mg by mouth daily.     Marland Kitchen tiZANidine (ZANAFLEX) 4 MG tablet Take 4 mg by mouth 2 (two) times daily as needed for muscle spasms.    . Vilazodone HCl (VIIBRYD) 10 MG TABS Take 10 mg by mouth daily.    Marland Kitchen docusate sodium (COLACE) 100 MG capsule Take 1 capsule (100 mg total) by mouth 2 (two) times daily. (Patient not taking: Reported on 09/22/2017) 30 capsule 0  . oxyCODONE-acetaminophen (PERCOCET/ROXICET) 5-325 MG tablet Take 1-2 tablets by mouth every 6 (six) hours as needed for severe pain. (Patient not taking: Reported on 10/27/2017) 40 tablet 0    Results for orders placed or performed during the hospital encounter of 10/31/17 (from the past 48 hour(s))  Glucose, capillary     Status: Abnormal   Collection Time: 10/31/17  6:06 AM  Result Value Ref Range   Glucose-Capillary 212 (H) 65 - 99 mg/dL   Comment 1 Notify RN    No results found.  Review of Systems  Musculoskeletal: Positive for joint pain.  All other systems reviewed and are negative.   Blood pressure 132/77, pulse 98, temperature 97.7 F (36.5 C), temperature source Oral, resp. rate 18, height 5\' 3"  (1.6 m), weight 202 lb (91.6 kg), SpO2 100 %. Physical Exam  Constitutional: She appears well-developed.  HENT:  Head: Normocephalic.  Eyes: Pupils are equal, round, and reactive to light.  Neck: Normal range of motion.  Cardiovascular: Normal rate.  Respiratory: Effort normal.  Neurological: She is alert.  Skin: Skin is warm.  Psychiatric: She has a normal mood and affect.  Shoulder exam demonstrates restricted passive range of motion on the right.  She has less than 90 degrees of forward flexion and abduction as well as weakness to supraspinatus strength testing.  No discrete AC joint tenderness right  versus left.  Motor sensory function to the hand is intact.  No clear-cut evidence of radiculopathy affecting the right arm  Assessment/Plan Impression is right shoulder rotator cuff tear and biceps tendinitis.  This is likely in the setting of a frozen shoulder.  Plan is shoulder manipulation with rotator cuff repair and biceps tenodesis.  Risks and benefits are discussed including but not limited to infection nerve vessel damage shoulder stiffness as well as incomplete pain relief.  Patient understands the risks  and benefits and wishes to proceed.  All questions answered  Anderson Malta, MD 10/31/2017, 7:28 AM

## 2017-10-31 NOTE — Progress Notes (Signed)
I tried calling [name] at the number provided but no one answered.

## 2017-10-31 NOTE — Anesthesia Postprocedure Evaluation (Signed)
Anesthesia Post Note  [name]  Procedure(s) Performed: RIGHT SHOULDER ARTHROSCOPY, MANIPULATION UNDER ANESTHESIA, BICEPS TENODESIS, AND MINI OPEN ROTATOR CUFF TEAR REPAIR (Right Shoulder)     Patient location during evaluation: PACU Anesthesia Type: General Level of consciousness: awake and alert Pain management: pain level controlled Vital Signs Assessment: post-procedure vital signs reviewed and stable Respiratory status: spontaneous breathing, nonlabored ventilation and respiratory function stable Cardiovascular status: blood pressure returned to baseline and stable Postop Assessment: no apparent nausea or vomiting Anesthetic complications: no    Vitals:   10/31/17 1123 10/31/17 1200  BP: 107/81 129/70  Pulse: 98 95  Resp: 18 16  Temp: 36.7 C 36.7 C  SpO2: 96% 100%     Last Pain:  Vitals:   10/31/17 1200  TempSrc:   PainSc: 0-No pain                 Audry Pili

## 2017-11-03 ENCOUNTER — Encounter (HOSPITAL_COMMUNITY): Payer: Self-pay | Admitting: Orthopedic Surgery

## 2017-11-04 ENCOUNTER — Telehealth (INDEPENDENT_AMBULATORY_CARE_PROVIDER_SITE_OTHER): Payer: Self-pay | Admitting: Orthopedic Surgery

## 2017-11-04 NOTE — Telephone Encounter (Signed)
Please advise thanks.

## 2017-11-04 NOTE — Telephone Encounter (Signed)
Patient requesting RX refill on oxycodone/acetaminophen. She still has some left but said her therapist did not want her to run out.  Patient would like her daughter Shaaron Adler to pick this up for her, she was not listed on her DPR. Please call patient when ready # [phone]

## 2017-11-05 ENCOUNTER — Telehealth (INDEPENDENT_AMBULATORY_CARE_PROVIDER_SITE_OTHER): Payer: Self-pay

## 2017-11-05 MED ORDER — OXYCODONE-ACETAMINOPHEN 5-325 MG PO TABS: 1.0000 | ORAL_TABLET | Freq: Three times a day (TID) | ORAL | 0 refills | Status: DC | PRN

## 2017-11-05 NOTE — Telephone Encounter (Signed)
error 

## 2017-11-05 NOTE — Telephone Encounter (Signed)
Ok for perc 5 325 1 po q 8 # 30 pls cla lhtx

## 2017-11-05 NOTE — Telephone Encounter (Signed)
Sonia Side with [facility] called to let you know that patient told her therapist that she d/c'd herself from the CPM b/c it hurt her neck too bad.

## 2017-11-05 NOTE — Telephone Encounter (Signed)
IC patient advised ready to be picked up. She stated she is unable to get here to pick up rx but gave verbal consent for her daughter Ms. Trilby Drummer to pick up rx for her

## 2017-11-05 NOTE — Telephone Encounter (Signed)
ok 

## 2017-11-13 ENCOUNTER — Ambulatory Visit (INDEPENDENT_AMBULATORY_CARE_PROVIDER_SITE_OTHER): Payer: Medicare Other | Admitting: Orthopedic Surgery

## 2017-11-13 ENCOUNTER — Encounter (INDEPENDENT_AMBULATORY_CARE_PROVIDER_SITE_OTHER): Payer: Self-pay | Admitting: Orthopedic Surgery

## 2017-11-13 DIAGNOSIS — Z9889 Other specified postprocedural states: Secondary | ICD-10-CM

## 2017-11-13 MED ORDER — OXYCODONE HCL 5 MG PO CAPS: ORAL_CAPSULE | ORAL | 0 refills | Status: DC

## 2017-11-13 MED ORDER — BACLOFEN 10 MG PO TABS: ORAL_TABLET | ORAL | 0 refills | Status: DC

## 2017-11-16 ENCOUNTER — Encounter (INDEPENDENT_AMBULATORY_CARE_PROVIDER_SITE_OTHER): Payer: Self-pay | Admitting: Orthopedic Surgery

## 2017-11-16 NOTE — Progress Notes (Signed)
Post-Op Visit Note   [name]           Date of Birth: January 26, 1966           MRN: [id number] Visit Date: 11/13/2017 PCP: Nolene Ebbs, MD   Assessment & Plan:  Chief Complaint:  Chief Complaint  Patient presents with  . Right Shoulder - Routine Post Op   Visit Diagnoses:  1. S/P rotator cuff repair     Plan: [name] is a patient who underwent right shoulder arthroscopy and manipulation biceps tenodesis and rotator cuff repair.  She is using the CPM machine.  She had a stiff shoulder at the time of surgery.  On exam she still has a stiff shoulder but it is improved.  Passive range of motion is still painful.  We will refill her oxycodone and baclofen.  Needs to continue CPM about 5 to 6 hours a day.  Increasing it over 90 degrees is important.  Follow-up with me in 3 weeks.  Follow-Up Instructions: Return in about 3 weeks (around 12/04/2017).   Orders:  Orders Placed This Encounter  Procedures  . Ambulatory referral to Physical Therapy   Meds ordered this encounter  Medications  . oxycodone (OXY-IR) 5 MG capsule    Sig: 1 to 2 po q 4hrs prn pnain    Dispense:  45 capsule    Refill:  0  . baclofen (LIORESAL) 10 MG tablet    Sig: 1 po tid prn    Dispense:  40 each    Refill:  0    Imaging: No results found.  PMFS History: Patient Active Problem List   Diagnosis Date Noted  . Complete tear of right rotator cuff 07/22/2017    Class: Chronic  . Retained orthopedic hardware 07/22/2017    Class: Chronic  . Cervical disc herniation 07/21/2017    Class: Acute  . Status post cervical spinal fusion 07/21/2017  . Chronic back pain    Past Medical History:  Diagnosis Date  . Anxiety   . Arthritis    rheumatoid , osteoarthritis  . Bronchitis   . Chronic back pain   . Depression   . Diabetes mellitus   . Fatty liver   . Fibromyalgia   . GERD (gastroesophageal reflux disease)   . Headache   . Hypertension   . Pancreatitis   . Pneumonia   . Rotator  cuff tear, right     Family History  Problem Relation Age of Onset  . Hypertension Father   . Renal Disease Father   . Diabetes Mother   . Hypertension Mother   . Edema Mother   . Diabetes Sister   . Diabetes Brother     Past Surgical History:  Procedure Laterality Date  . ABDOMINAL HYSTERECTOMY    . ANTERIOR CERVICAL DECOMP/DISCECTOMY FUSION N/A 07/21/2017   Procedure: ANTERIOR CERVICAL DISCECTOMY AND FUSION C6-7, REMOVAL OF SCREW C6 PLATE, DEPUY ZERO-P IMPLANT, LOCAL BONE GRAFT, ALLOGRAFT BONE GRAFT, VIVIGEN;  Surgeon: Jessy Oto, MD;  Location: [place];  Service: Orthopedics;  Laterality: N/A;  . BACK SURGERY     laminectomy  . BONE GRAFT HIP ILIAC CREST    . CARPAL TUNNEL RELEASE    . CERVICAL DISCECTOMY  07/21/2017  . CESAREAN SECTION    . CHOLECYSTECTOMY    . COLONOSCOPY    . frontal fusion    . neck fusion     patient reported  . SHOULDER ARTHROSCOPY WITH SUBACROMIAL DECOMPRESSION, ROTATOR CUFF REPAIR  AND BICEP TENDON REPAIR Right 10/31/2017   Procedure: RIGHT SHOULDER ARTHROSCOPY, MANIPULATION UNDER ANESTHESIA, BICEPS TENODESIS, AND MINI OPEN ROTATOR CUFF TEAR REPAIR;  Surgeon: Meredith Pel, MD;  Location: [place];  Service: Orthopedics;  Laterality: Right;   Social History   Occupational History  . Not on file  Tobacco Use  . Smoking status: Current Every Day Smoker    Packs/day: 0.50    Types: Cigarettes  . Smokeless tobacco: Never Used  Substance and Sexual Activity  . Alcohol use: No  . Drug use: Yes    Types: Marijuana  . Sexual activity: Not on file

## 2017-11-21 ENCOUNTER — Telehealth (INDEPENDENT_AMBULATORY_CARE_PROVIDER_SITE_OTHER): Payer: Self-pay | Admitting: Orthopedic Surgery

## 2017-11-21 NOTE — Telephone Encounter (Signed)
IC patient.

## 2017-11-21 NOTE — Telephone Encounter (Signed)
Sonia Side -(PT) with [facility] called advised (PT) is working with patient and she is having severe spasms in her shoulder. Sonia Side asked if the patient can be called.    The number to contact Sonia Side is [phone]

## 2017-12-01 ENCOUNTER — Ambulatory Visit (INDEPENDENT_AMBULATORY_CARE_PROVIDER_SITE_OTHER): Payer: Medicare Other | Admitting: Orthopedic Surgery

## 2017-12-03 ENCOUNTER — Ambulatory Visit (INDEPENDENT_AMBULATORY_CARE_PROVIDER_SITE_OTHER): Payer: Medicare Other | Admitting: Orthopedic Surgery

## 2017-12-05 ENCOUNTER — Telehealth (INDEPENDENT_AMBULATORY_CARE_PROVIDER_SITE_OTHER): Payer: Self-pay | Admitting: Orthopedic Surgery

## 2017-12-05 ENCOUNTER — Other Ambulatory Visit (INDEPENDENT_AMBULATORY_CARE_PROVIDER_SITE_OTHER): Payer: Self-pay | Admitting: Physician Assistant

## 2017-12-05 MED ORDER — HYDROCODONE-ACETAMINOPHEN 5-325 MG PO TABS: 1.0000 | ORAL_TABLET | Freq: Two times a day (BID) | ORAL | 0 refills | Status: DC | PRN

## 2017-12-05 NOTE — Telephone Encounter (Signed)
Postop shoulder scope, patient of [facility], last percocet Rx was 11/13/17 #45, can you advise on Rx for patient?

## 2017-12-05 NOTE — Telephone Encounter (Signed)
Gennaro Africa -(PT) with [facility] called left voicemail message stating patient only have one tab left (Oxycodone) She asked if the patient can be called when to  pick up Rx. The number to contact patient is [phone]

## 2017-12-05 NOTE — Telephone Encounter (Signed)
Will write for norco

## 2017-12-05 NOTE — Telephone Encounter (Signed)
IC pt and advised 

## 2017-12-11 ENCOUNTER — Encounter (INDEPENDENT_AMBULATORY_CARE_PROVIDER_SITE_OTHER): Payer: Self-pay | Admitting: Orthopedic Surgery

## 2017-12-11 ENCOUNTER — Ambulatory Visit (INDEPENDENT_AMBULATORY_CARE_PROVIDER_SITE_OTHER): Payer: Medicare Other | Admitting: Orthopedic Surgery

## 2017-12-11 DIAGNOSIS — Z9889 Other specified postprocedural states: Secondary | ICD-10-CM

## 2017-12-11 MED ORDER — OXYCODONE HCL 5 MG PO TABS: ORAL_TABLET | ORAL | 0 refills | Status: DC

## 2017-12-11 MED ORDER — BACLOFEN 10 MG PO TABS: ORAL_TABLET | ORAL | 0 refills | Status: DC

## 2017-12-13 ENCOUNTER — Encounter (INDEPENDENT_AMBULATORY_CARE_PROVIDER_SITE_OTHER): Payer: Self-pay | Admitting: Orthopedic Surgery

## 2017-12-13 NOTE — Progress Notes (Signed)
Office Visit Note   [name]           Date of Birth: [birth year]           MRN: [id number] Visit Date: 12/11/2017 Requested by: Nolene Ebbs, MD [address] [place], [place] [address] PCP: Nolene Ebbs, MD  Subjective: Chief Complaint  Patient presents with  . Right Shoulder - Routine Post Op    HPI: [name] is a patient who is now 6 weeks out right shoulder manipulation with biceps tenodesis and cuff repair.  She still is using the brace which she is using at 90 degrees of abduction.  It is hard for her to sleep at times.  [name] refilled her oxycodone and baclofen today.  Start outpatient PT and continue with range of motion strengthening 3 times a week for 6 weeks.  I will see her back in about 4 weeks for clinical recheck.  She is a little bit better but has pretty tenderness with any manipulative attempts to move that right arm.  I do not think she is refrozen but she is at risk for that based on her sensitivity to motion.  The cuff repair feels good on manual motor testing as well as passive range of motion.              ROS: See above  Assessment & Plan: Visit Diagnoses:  1. S/P right rotator cuff repair     Plan: See above  Follow-Up Instructions: Return in about 1 month (around 01/08/2018).   Orders:  Orders Placed This Encounter  Procedures  . Ambulatory referral to Physical Therapy   Meds ordered this encounter  Medications  . oxyCODONE (ROXICODONE) 5 MG immediate release tablet    Sig: 1-2 po q 6 hrs prn pain    Dispense:  30 tablet    Refill:  0  . baclofen (LIORESAL) 10 MG tablet    Sig: 1 po tid prn spasm    Dispense:  30 each    Refill:  0      Procedures: No procedures performed   Clinical Data: No additional findings.  Objective: Vital Signs: There were no vitals taken for this visit.  Physical Exam: See above  Ortho Exam: See above  Specialty Comments:  No specialty comments available.  Imaging: No results  found.   PMFS History: Patient Active Problem List   Diagnosis Date Noted  . Complete tear of right rotator cuff 07/22/2017    Class: Chronic  . Retained orthopedic hardware 07/22/2017    Class: Chronic  . Cervical disc herniation 07/21/2017    Class: Acute  . Status post cervical spinal fusion 07/21/2017  . Chronic back pain    Past Medical History:  Diagnosis Date  . Anxiety   . Arthritis    rheumatoid , osteoarthritis  . Bronchitis   . Chronic back pain   . Depression   . Diabetes mellitus   . Fatty liver   . Fibromyalgia   . GERD (gastroesophageal reflux disease)   . Headache   . Hypertension   . Pancreatitis   . Pneumonia   . Rotator cuff tear, right     Family History  Problem Relation Age of Onset  . Hypertension Father   . Renal Disease Father   . Diabetes Mother   . Hypertension Mother   . Edema Mother   . Diabetes Sister   . Diabetes Brother     Past Surgical History:  Procedure Laterality Date  .  ABDOMINAL HYSTERECTOMY    . ANTERIOR CERVICAL DECOMP/DISCECTOMY FUSION N/A 07/21/2017   Procedure: ANTERIOR CERVICAL DISCECTOMY AND FUSION C6-7, REMOVAL OF SCREW C6 PLATE, DEPUY ZERO-P IMPLANT, LOCAL BONE GRAFT, ALLOGRAFT BONE GRAFT, VIVIGEN;  Surgeon: Jessy Oto, MD;  Location: [facility];  Service: Orthopedics;  Laterality: N/A;  . BACK SURGERY     laminectomy  . BONE GRAFT HIP ILIAC CREST    . CARPAL TUNNEL RELEASE    . CERVICAL DISCECTOMY  07/21/2017  . CESAREAN SECTION    . CHOLECYSTECTOMY    . COLONOSCOPY    . frontal fusion    . neck fusion     patient reported  . SHOULDER ARTHROSCOPY WITH SUBACROMIAL DECOMPRESSION, ROTATOR CUFF REPAIR AND BICEP TENDON REPAIR Right 10/31/2017   Procedure: RIGHT SHOULDER ARTHROSCOPY, MANIPULATION UNDER ANESTHESIA, BICEPS TENODESIS, AND MINI OPEN ROTATOR CUFF TEAR REPAIR;  Surgeon: Meredith Pel, MD;  Location: [facility];  Service: Orthopedics;  Laterality: Right;   Social History   Occupational History  . Not  on file  Tobacco Use  . Smoking status: Current Every Day Smoker    Packs/day: 0.50    Types: Cigarettes  . Smokeless tobacco: Never Used  Substance and Sexual Activity  . Alcohol use: No  . Drug use: Yes    Types: Marijuana  . Sexual activity: Not on file

## 2017-12-16 ENCOUNTER — Other Ambulatory Visit (INDEPENDENT_AMBULATORY_CARE_PROVIDER_SITE_OTHER): Payer: Self-pay | Admitting: Orthopedic Surgery

## 2017-12-16 NOTE — Telephone Encounter (Signed)
Ok to rf? 

## 2017-12-17 ENCOUNTER — Other Ambulatory Visit: Payer: Self-pay

## 2017-12-17 ENCOUNTER — Encounter: Payer: Self-pay | Admitting: Physical Therapy

## 2017-12-17 ENCOUNTER — Ambulatory Visit: Payer: Medicare Other | Attending: Orthopedic Surgery | Admitting: Physical Therapy

## 2017-12-17 DIAGNOSIS — M25611 Stiffness of right shoulder, not elsewhere classified: Secondary | ICD-10-CM | POA: Insufficient documentation

## 2017-12-17 DIAGNOSIS — M6281 Muscle weakness (generalized): Secondary | ICD-10-CM | POA: Diagnosis present

## 2017-12-17 DIAGNOSIS — G8929 Other chronic pain: Secondary | ICD-10-CM | POA: Diagnosis present

## 2017-12-17 DIAGNOSIS — M25511 Pain in right shoulder: Secondary | ICD-10-CM | POA: Insufficient documentation

## 2017-12-17 NOTE — Telephone Encounter (Signed)
y

## 2017-12-17 NOTE — Therapy (Signed)
[place], [place], [address] Phone: [phone]   Fax:  [phone]  Physical Therapy Evaluation  Patient Details  Name: [name] MRN: [id number] Date of Birth: Mar 23, 1966 Referring Provider: Tonna Corner dean MD   Encounter Date: 12/17/2017  PT End of Session - 12/17/17 1239    Visit Number  1    Number of Visits  18    Date for PT Re-Evaluation  01/28/18    Authorization Type  MCR: Kx mod byt 15th visit, progress note by 10th visit.    PT Start Time  1101    PT Stop Time  1145    PT Time Calculation (min)  44 min    Activity Tolerance  Patient tolerated treatment well    Behavior During Therapy  WFL for tasks assessed/performed       Past Medical History:  Diagnosis Date  . Anxiety   . Arthritis    rheumatoid , osteoarthritis  . Bronchitis   . Chronic back pain   . Depression   . Diabetes mellitus   . Fatty liver   . Fibromyalgia   . GERD (gastroesophageal reflux disease)   . Headache   . Hypertension   . Pancreatitis   . Pneumonia   . Rotator cuff tear, right     Past Surgical History:  Procedure Laterality Date  . ABDOMINAL HYSTERECTOMY    . ANTERIOR CERVICAL DECOMP/DISCECTOMY FUSION N/A 07/21/2017   Procedure: ANTERIOR CERVICAL DISCECTOMY AND FUSION C6-7, REMOVAL OF SCREW C6 PLATE, DEPUY ZERO-P IMPLANT, LOCAL BONE GRAFT, ALLOGRAFT BONE GRAFT, VIVIGEN;  Surgeon: Jessy Oto, MD;  Location: [facility];  Service: Orthopedics;  Laterality: N/A;  . BACK SURGERY     laminectomy  . BONE GRAFT HIP ILIAC CREST    . CARPAL TUNNEL RELEASE    . CERVICAL DISCECTOMY  07/21/2017  . CESAREAN SECTION    . CHOLECYSTECTOMY    . COLONOSCOPY    . frontal fusion    . neck fusion     patient reported  . SHOULDER ARTHROSCOPY WITH SUBACROMIAL DECOMPRESSION, ROTATOR CUFF REPAIR AND BICEP TENDON REPAIR Right 10/31/2017   Procedure: RIGHT SHOULDER ARTHROSCOPY, MANIPULATION UNDER ANESTHESIA, BICEPS  TENODESIS, AND MINI OPEN ROTATOR CUFF TEAR REPAIR;  Surgeon: Meredith Pel, MD;  Location: [facility];  Service: Orthopedics;  Laterality: Right;    There were no vitals filed for this visit.   Subjective Assessment - 12/17/17 1108    Subjective  pt is a 52 y.o F s/p R shoulder manipulation, bicep tenodesis, RCR on 10/31/2017. Since the surgery she reports things are getting better without as much. pain and N/T going down the arm in to the wrist and hand/ fingers.     Limitations  Lifting    How long can you sit comfortably?  20 min     How long can you stand comfortably?  30-60 min    How long can you walk comfortably?  30-60 min    Diagnostic tests  MRI     Patient Stated Goals  decrease the shoulder pain, regain full motion, regain strength    Currently in Pain?  Yes    Pain Score  7  last took medication 45 min ago. at worst pain gets to 10/10    Pain Location  Shoulder    Pain Orientation  Right;Anterior;Medial;Upper    Pain Descriptors / Information systems manager;Sharp;Stabbing;Tingling;Aching;Sore    Pain Type  Surgical pain  Pain Radiating Towards  to the wrist and fingers    Pain Onset  More than a month ago    Pain Frequency  Constant    Aggravating Factors   using the R arm, getting up in the morning, opening the door    Pain Relieving Factors  medication, recreational medicaton, heat occassional     Effect of Pain on Daily Activities   limited use of RUE         [facility] PT Assessment - 12/17/17 1107      Assessment   Medical Diagnosis  s/p R rotator cuff repair    Referring Provider  gregory scott dean MD    Onset Date/Surgical Date  10/31/17    Hand Dominance  Right    Next MD Visit  first week of June    Prior Therapy  no      Precautions   Precautions  Shoulder    Precaution Comments  avoid lifting/ reaching with RUE      Restrictions   Weight Bearing Restrictions  Yes      Balance Screen   Has the patient fallen in the past 6 months  Yes    How many times?   2    Has the patient had a decrease in activity level because of a fear of falling?   No    Is the patient reluctant to leave their home because of a fear of falling?   No      Home Environment   Living Environment  Private residence    Living Arrangements  Alone    Type of [place] to enter    Entrance Stairs-Number of Steps  10    Entrance Stairs-Rails  Right ascending    Home Layout  One level    [place] - 2 wheels;Crutches;Cane - single point sling, CPM       Prior Function   Level of Independence  Independent with basic ADLs    Vocation  On disability      Cognition   Overall Cognitive Status  Within Functional Limits for tasks assessed      Observation/Other Assessments   Focus on Therapeutic Outcomes (FOTO)   59% limited predicted 38% limited      Posture/Postural Control   Posture/Postural Control  Postural limitations    Postural Limitations  Rounded Shoulders;Forward head      ROM / Strength   AROM / PROM / Strength  AROM;PROM;Strength      AROM   AROM Assessment Site  Shoulder    Right/Left Shoulder  Right;Left    Right Shoulder Extension  32 Degrees ERP    Right Shoulder Flexion  38 Degrees ERP    Right Shoulder ABduction  40 Degrees ERP    Right Shoulder Internal Rotation  45 Degrees assessed in neutral, PDM    Right Shoulder External Rotation  26 Degrees assessed in nuetral, PDM     Left Shoulder Extension  66 Degrees    Left Shoulder Flexion  166 Degrees    Left Shoulder ABduction  138 Degrees    Left Shoulder Internal Rotation  64 Degrees assessed in 90/90    Left Shoulder External Rotation  62 Degrees assessed in 90/90      PROM   PROM Assessment Site  Shoulder    Right/Left Shoulder  Right    Right Shoulder Flexion  70 Degrees    Right Shoulder  ABduction  62 Degrees    Right Shoulder External Rotation  10 Degrees in 45 degrees of abduction, significant pain       Strength   Overall Strength Comments   RUE assessed based on ROM    Strength Assessment Site  Shoulder;Hand    Right/Left Shoulder  Right;Left    Right Shoulder Flexion  3-/5    Right Shoulder Extension  3-/5    Right Shoulder ABduction  3-/5    Right Shoulder Internal Rotation  3-/5    Right Shoulder External Rotation  3-/5    Left Shoulder Flexion  5/5    Left Shoulder Extension  5/5    Left Shoulder ABduction  4+/5    Left Shoulder Internal Rotation  5/5    Left Shoulder External Rotation  4+/5    Right Hand Grip (lbs)  47.6 38,57, 48    Left Hand Grip (lbs)  61.67 44,03,47      Palpation   Palpation comment  TTP along distal clavicle. sub-acromial space, multiple trigger points in the upper trap/ levator scapulae                Objective measurements completed on examination: See above findings.              PT Education - 12/17/17 1058    Education provided  Yes    Education Details  evaluation findings, POC, goals, HEp with proper form/ rationale. precautions    Person(s) Educated  Patient    Methods  Explanation;Verbal cues;Handout    Comprehension  Verbalized understanding;Verbal cues required       PT Short Term Goals - 12/17/17 1250      PT SHORT TERM GOAL #1   Title  pt to be I with inital HEP     Time  3    Period  Weeks    Status  New    Target Date  01/07/18      PT SHORT TERM GOAL #2   Title  pt to vebralize and demo techniques to reduce inflammation via RICE and HEP     Time  3    Period  Weeks    Status  New    Target Date  01/07/18      PT SHORT TERM GOAL #3   Title  pt to increase R grip strength by >/= 10# to demo shoulder function improvement    Time  3    Period  Weeks    Status  New    Target Date  01/07/18        PT Long Term Goals - 12/17/17 1252      PT LONG TERM GOAL #1   Title  improve R shoulder flexion/ abduction to >/= 130 degrees and shoulder IR/ER to >/= 50 degrees ( assessed at 90/90) for functional mobility required for ADLs with </= 2/10  pain     Time  6    Period  Weeks    Status  New    Target Date  01/28/18      PT LONG TERM GOAL #2   Title  increase R shoulder strength to >/= 4/5 in all planes to promote shoulder stability with lifting and carrying activtieis     Time  6    Period  Weeks    Status  New    Target Date  01/28/18      PT LONG TERM GOAL #3   Title  pt be able  to lift/ lower >/= 8# to and from overhead hself and push/pull >/=10# reporting </=2/10 pain for funtional strength required for ADLs    Time  6    Period  Weeks    Status  New    Target Date  01/28/18      PT LONG TERM GOAL #4   Title  pt to increase FOTO score to </= 38% limited to demo improvement in function     Time  6    Period  Weeks    Status  New    Target Date  01/28/18      PT LONG TERM GOAL #5   Title  pt to be I with all HEP given as of last visit to maintain and progress currrent level of function     Time  6    Period  Weeks    Status  New    Target Date  01/28/18             Plan - 12/17/17 1241    Clinical Impression Statement  pt presents to OPPT with CC of R shoulder pain s/p R shoulder manipualation, mini RCR on 10/31/2017. she demosntratres significant guarding limiting AROM/ PROM and limited strength. she demonstates significant soreness at distal clavicle/. sub-acromial space and multple trigger points along the upper trap/levator scapulae. pt would from physical therapy to promote shoulder mobility, improve strength, reduce pain, and maximize function.     History and Personal Factors relevant to plan of care:  hx or mulitple past surgeries, pt lives alone    Clinical Presentation  Evolving    Clinical Presentation due to:  significant limited shoulde r    Clinical Decision Making  Moderate    Rehab Potential  Good    PT Frequency  3x / week    PT Duration  6 weeks    PT Treatment/Interventions  ADLs/Self Care Home Management;Cryotherapy;Electrical Stimulation;Iontophoresis 4mg /ml  Dexamethasone;Ultrasound;Moist Heat;Passive range of motion;Dry needling;Taping;Manual techniques;Therapeutic exercise;Therapeutic activities;Patient/family education    PT Next Visit Plan  review/ update HEP, PROM/ AAROM, scapular setting, soft tissue work modalities for pain     PT Home Exercise Plan  shoulder table slides flexion/ abduction, isometrics flexion/ abduction/ IR/ER .    Consulted and Agree with Plan of Care  Patient       Patient will benefit from skilled therapeutic intervention in order to improve the following deficits and impairments:  Pain, Decreased strength, Impaired UE functional use, Increased fascial restricitons, Increased muscle spasms, Postural dysfunction, Improper body mechanics, Decreased endurance, Decreased activity tolerance  Visit Diagnosis: Chronic right shoulder pain  Muscle weakness (generalized)  Stiffness of right shoulder, not elsewhere classified     Problem List Patient Active Problem List   Diagnosis Date Noted  . Complete tear of right rotator cuff 07/22/2017    Class: Chronic  . Retained orthopedic hardware 07/22/2017    Class: Chronic  . Cervical disc herniation 07/21/2017    Class: Acute  . Status post cervical spinal fusion 07/21/2017  . Chronic back pain    Starr Lake PT, DPT, LAT, ATC  12/17/17  12:55 PM      [facility] [address] [place], [place], [address] Phone: [phone]   Fax:  [phone]  Name: JAELIN DEVINCENTIS MRN: [id number] Date of Birth: [birth year]

## 2017-12-23 ENCOUNTER — Ambulatory Visit: Payer: Medicare Other | Admitting: Physical Therapy

## 2017-12-23 ENCOUNTER — Encounter: Payer: Self-pay | Admitting: Physical Therapy

## 2017-12-23 DIAGNOSIS — M25611 Stiffness of right shoulder, not elsewhere classified: Secondary | ICD-10-CM

## 2017-12-23 DIAGNOSIS — G8929 Other chronic pain: Secondary | ICD-10-CM

## 2017-12-23 DIAGNOSIS — M25511 Pain in right shoulder: Principal | ICD-10-CM

## 2017-12-23 DIAGNOSIS — M6281 Muscle weakness (generalized): Secondary | ICD-10-CM

## 2017-12-23 NOTE — Therapy (Signed)
[place], [place], [address] Phone: [phone]   Fax:  [phone]  Physical Therapy Treatment  Patient Details  Name: [name] MRN: [id number] Date of Birth: [birth year] Referring Provider: Tonna Corner dean MD   Encounter Date: 12/23/2017  PT End of Session - 12/23/17 1153    Visit Number  2    Number of Visits  18    Date for PT Re-Evaluation  01/28/18    Authorization Type  MCR: Kx mod byt 15th visit, progress note by 10th visit.    PT Start Time  1153 pt arrived 8 min late    PT Stop Time  1228    PT Time Calculation (min)  35 min    Activity Tolerance  Patient tolerated treatment well    Behavior During Therapy  WFL for tasks assessed/performed       Past Medical History:  Diagnosis Date  . Anxiety   . Arthritis    rheumatoid , osteoarthritis  . Bronchitis   . Chronic back pain   . Depression   . Diabetes mellitus   . Fatty liver   . Fibromyalgia   . GERD (gastroesophageal reflux disease)   . Headache   . Hypertension   . Pancreatitis   . Pneumonia   . Rotator cuff tear, right     Past Surgical History:  Procedure Laterality Date  . ABDOMINAL HYSTERECTOMY    . ANTERIOR CERVICAL DECOMP/DISCECTOMY FUSION N/A 07/21/2017   Procedure: ANTERIOR CERVICAL DISCECTOMY AND FUSION C6-7, REMOVAL OF SCREW C6 PLATE, DEPUY ZERO-P IMPLANT, LOCAL BONE GRAFT, ALLOGRAFT BONE GRAFT, VIVIGEN;  Surgeon: Jessy Oto, MD;  Location: [facility];  Service: Orthopedics;  Laterality: N/A;  . BACK SURGERY     laminectomy  . BONE GRAFT HIP ILIAC CREST    . CARPAL TUNNEL RELEASE    . CERVICAL DISCECTOMY  07/21/2017  . CESAREAN SECTION    . CHOLECYSTECTOMY    . COLONOSCOPY    . frontal fusion    . neck fusion     patient reported  . SHOULDER ARTHROSCOPY WITH SUBACROMIAL DECOMPRESSION, ROTATOR CUFF REPAIR AND BICEP TENDON REPAIR Right 10/31/2017   Procedure: RIGHT SHOULDER ARTHROSCOPY, MANIPULATION UNDER  ANESTHESIA, BICEPS TENODESIS, AND MINI OPEN ROTATOR CUFF TEAR REPAIR;  Surgeon: Meredith Pel, MD;  Location: [facility];  Service: Orthopedics;  Laterality: Right;    There were no vitals filed for this visit.  Subjective Assessment - 12/23/17 1153    Subjective  "The exercise are going good at home and but I am afraid of the spasm that occurs"     Patient Stated Goals  decrease the shoulder pain, regain full motion, regain strength    Currently in Pain?  Yes    Pain Score  7  last took medication for pain 10:30 am    Pain Orientation  Right;Anterior;Upper    Pain Descriptors / Indicators  Aching;Sore    Pain Type  Surgical pain    Pain Onset  More than a month ago    Pain Frequency  Constant                       [facility] Adult PT Treatment/Exercise - 12/23/17 1232      Shoulder Exercises: Pulleys   Flexion  3 minutes    Flexion Limitations  signifcant cues to avoid shoulder hiking and relax RUE as much as possible      Shoulder  Exercises: Isometric Strengthening   Flexion  5X10"    Extension  5X10"    External Rotation  5X10"    Internal Rotation  5X10"      Manual Therapy   Manual Therapy  Soft tissue mobilization    Manual therapy comments  MTPR along the R upper trap    Soft tissue mobilization  IASTM along the R upper trap             PT Education - 12/23/17 1227    Education provided  Yes    Education Details  reviewed previously provided HEP, updated HEP for upper trap stretching. anatomy of the upper trap and avoidance techniques to prevent over activation.     Person(s) Educated  Patient    Methods  Explanation;Verbal cues;Handout    Comprehension  Verbalized understanding;Verbal cues required       PT Short Term Goals - 12/17/17 1250      PT SHORT TERM GOAL #1   Title  pt to be I with inital HEP     Time  3    Period  Weeks    Status  New    Target Date  01/07/18      PT SHORT TERM GOAL #2   Title  pt to vebralize and demo  techniques to reduce inflammation via RICE and HEP     Time  3    Period  Weeks    Status  New    Target Date  01/07/18      PT SHORT TERM GOAL #3   Title  pt to increase R grip strength by >/= 10# to demo shoulder function improvement    Time  3    Period  Weeks    Status  New    Target Date  01/07/18        PT Long Term Goals - 12/17/17 1252      PT LONG TERM GOAL #1   Title  improve R shoulder flexion/ abduction to >/= 130 degrees and shoulder IR/ER to >/= 50 degrees ( assessed at 90/90) for functional mobility required for ADLs with </= 2/10 pain     Time  6    Period  Weeks    Status  New    Target Date  01/28/18      PT LONG TERM GOAL #2   Title  increase R shoulder strength to >/= 4/5 in all planes to promote shoulder stability with lifting and carrying activtieis     Time  6    Period  Weeks    Status  New    Target Date  01/28/18      PT LONG TERM GOAL #3   Title  pt be able to lift/ lower >/= 8# to and from overhead hself and push/pull >/=10# reporting </=2/10 pain for funtional strength required for ADLs    Time  6    Period  Weeks    Status  New    Target Date  01/28/18      PT LONG TERM GOAL #4   Title  pt to increase FOTO score to </= 38% limited to demo improvement in function     Time  6    Period  Weeks    Status  New    Target Date  01/28/18      PT LONG TERM GOAL #5   Title  pt to be I with all HEP given as of last visit to  maintain and progress currrent level of function     Time  6    Period  Weeks    Status  New    Target Date  01/28/18            Plan - 12/23/17 1228    Clinical Impression Statement  pt reports consistency with her HEP reports continued spasm noted inthe shoulder. pt demonstrates significant spasm in the R upper trap, with multiple trigger points noted. soft tissue work to upper trap and continued shoulder PROM and isometrics. end of session she reported reduced pain and tightness and declined modalities.     PT  Treatment/Interventions  ADLs/Self Care Home Management;Cryotherapy;Electrical Stimulation;Iontophoresis 4mg /ml Dexamethasone;Ultrasound;Moist Heat;Passive range of motion;Dry needling;Taping;Manual techniques;Therapeutic exercise;Therapeutic activities;Patient/family education    PT Next Visit Plan  review/ update HEP, PROM/ AAROM, scapular setting, soft tissue work modalities for pain     PT Home Exercise Plan  shoulder table slides flexion/ abduction, isometrics flexion/ abduction/ IR/ER .    Consulted and Agree with Plan of Care  Patient       Patient will benefit from skilled therapeutic intervention in order to improve the following deficits and impairments:  Pain, Decreased strength, Impaired UE functional use, Increased fascial restricitons, Increased muscle spasms, Postural dysfunction, Improper body mechanics, Decreased endurance, Decreased activity tolerance  Visit Diagnosis: Chronic right shoulder pain  Muscle weakness (generalized)  Stiffness of right shoulder, not elsewhere classified     Problem List Patient Active Problem List   Diagnosis Date Noted  . Complete tear of right rotator cuff 07/22/2017    Class: Chronic  . Retained orthopedic hardware 07/22/2017    Class: Chronic  . Cervical disc herniation 07/21/2017    Class: Acute  . Status post cervical spinal fusion 07/21/2017  . Chronic back pain    Starr Lake PT, DPT, LAT, ATC  12/23/17  12:33 PM      [facility] [address] [place], [place], [address] Phone: [phone]   Fax:  [phone]  Name: [name] MRN: [id number] Date of Birth: 1966/05/29

## 2017-12-24 ENCOUNTER — Other Ambulatory Visit: Payer: Self-pay

## 2017-12-24 ENCOUNTER — Encounter: Payer: Self-pay | Admitting: Physical Therapy

## 2017-12-24 ENCOUNTER — Ambulatory Visit: Payer: Medicare Other | Admitting: Physical Therapy

## 2017-12-24 DIAGNOSIS — M6281 Muscle weakness (generalized): Secondary | ICD-10-CM

## 2017-12-24 DIAGNOSIS — M25611 Stiffness of right shoulder, not elsewhere classified: Secondary | ICD-10-CM

## 2017-12-24 DIAGNOSIS — G8929 Other chronic pain: Secondary | ICD-10-CM

## 2017-12-24 DIAGNOSIS — M25511 Pain in right shoulder: Secondary | ICD-10-CM | POA: Diagnosis not present

## 2017-12-24 NOTE — Therapy (Signed)
[place], [place], [address] Phone: [phone]   Fax:  [phone]  Physical Therapy Treatment  Patient Details  Name: [name] MRN: [id number] Date of Birth: 10-27-1965 Referring Provider: Tonna Corner dean MD   Encounter Date: 12/24/2017  PT End of Session - 12/24/17 1319    Visit Number  3    Number of Visits  18    Date for PT Re-Evaluation  01/28/18    Authorization Type  MCR: Kx mod byt 15th visit, progress note by 10th visit.    PT Start Time  1238    PT Stop Time  1316    PT Time Calculation (min)  38 min    Activity Tolerance  Patient limited by pain    Behavior During Therapy  [facility] for tasks assessed/performed;Anxious       Past Medical History:  Diagnosis Date  . Anxiety   . Arthritis    rheumatoid , osteoarthritis  . Bronchitis   . Chronic back pain   . Depression   . Diabetes mellitus   . Fatty liver   . Fibromyalgia   . GERD (gastroesophageal reflux disease)   . Headache   . Hypertension   . Pancreatitis   . Pneumonia   . Rotator cuff tear, right     Past Surgical History:  Procedure Laterality Date  . ABDOMINAL HYSTERECTOMY    . ANTERIOR CERVICAL DECOMP/DISCECTOMY FUSION N/A 07/21/2017   Procedure: ANTERIOR CERVICAL DISCECTOMY AND FUSION C6-7, REMOVAL OF SCREW C6 PLATE, DEPUY ZERO-P IMPLANT, LOCAL BONE GRAFT, ALLOGRAFT BONE GRAFT, VIVIGEN;  Surgeon: Jessy Oto, MD;  Location: [place];  Service: Orthopedics;  Laterality: N/A;  . BACK SURGERY     laminectomy  . BONE GRAFT HIP ILIAC CREST    . CARPAL TUNNEL RELEASE    . CERVICAL DISCECTOMY  07/21/2017  . CESAREAN SECTION    . CHOLECYSTECTOMY    . COLONOSCOPY    . frontal fusion    . neck fusion     patient reported  . SHOULDER ARTHROSCOPY WITH SUBACROMIAL DECOMPRESSION, ROTATOR CUFF REPAIR AND BICEP TENDON REPAIR Right 10/31/2017   Procedure: RIGHT SHOULDER ARTHROSCOPY, MANIPULATION UNDER ANESTHESIA, BICEPS  TENODESIS, AND MINI OPEN ROTATOR CUFF TEAR REPAIR;  Surgeon: Meredith Pel, MD;  Location: [place];  Service: Orthopedics;  Laterality: Right;    There were no vitals filed for this visit.  Subjective Assessment - 12/24/17 1250    Subjective  Pt reported her HEP is going well. Pt reporting worse pain in her back which is chronic    Limitations  Lifting    Patient Stated Goals  decrease the shoulder pain, regain full motion, regain strength    Currently in Pain?  Yes    Pain Score  5     Pain Location  Shoulder    Pain Orientation  Right    Pain Descriptors / Indicators  Aching;Sore;Spasm    Pain Type  Acute pain;Surgical pain    Pain Onset  More than a month ago    Pain Frequency  Constant                       [facility] Adult PT Treatment/Exercise - 12/24/17 0001      Shoulder Exercises: Supine   Flexion  AAROM;10 reps    Other Supine Exercises  shoulder presses with wand/cane x 10      Shoulder Exercises: Seated   Other  Seated Exercises  shoulder depression to help pt relax, deep breathing performed      Shoulder Exercises: Isometric Strengthening   Flexion  5X10"    Extension  5X10"    External Rotation  5X10"    Internal Rotation  5X10"      Shoulder Exercises: Stretch   Other Shoulder Stretches  upper trap stretch x 2 reps      Modalities   Modalities  Cryotherapy      Cryotherapy   Number Minutes Cryotherapy  8 Minutes    Cryotherapy Location  Shoulder    Type of Cryotherapy  Ice pack      Manual Therapy   Manual Therapy  Soft tissue mobilization    Manual therapy comments  MTPR along the R upper trap difficulty tolerating pressure    Soft tissue mobilization  along R upper trap and medial scapular border, bicep tendon             PT Education - 12/23/17 1227    Education provided  Yes    Education Details  reviewed previously provided HEP, updated HEP for upper trap stretching. anatomy of the upper trap and avoidance techniques to  prevent over activation.     Person(s) Educated  Patient    Methods  Explanation;Verbal cues;Handout    Comprehension  Verbalized understanding;Verbal cues required       PT Short Term Goals - 12/24/17 1314      PT SHORT TERM GOAL #1   Title  pt to be I with inital HEP     Time  3    Period  Weeks    Status  On-going      PT SHORT TERM GOAL #2   Title  pt to vebralize and demo techniques to reduce inflammation via RICE and HEP     Time  3    Period  Weeks    Status  New        PT Long Term Goals - 12/17/17 1252      PT LONG TERM GOAL #1   Title  improve R shoulder flexion/ abduction to >/= 130 degrees and shoulder IR/ER to >/= 50 degrees ( assessed at 90/90) for functional mobility required for ADLs with </= 2/10 pain     Time  6    Period  Weeks    Status  New    Target Date  01/28/18      PT LONG TERM GOAL #2   Title  increase R shoulder strength to >/= 4/5 in all planes to promote shoulder stability with lifting and carrying activtieis     Time  6    Period  Weeks    Status  New    Target Date  01/28/18      PT LONG TERM GOAL #3   Title  pt be able to lift/ lower >/= 8# to and from overhead hself and push/pull >/=10# reporting </=2/10 pain for funtional strength required for ADLs    Time  6    Period  Weeks    Status  New    Target Date  01/28/18      PT LONG TERM GOAL #4   Title  pt to increase FOTO score to </= 38% limited to demo improvement in function     Time  6    Period  Weeks    Status  New    Target Date  01/28/18      PT LONG TERM  GOAL #5   Title  pt to be I with all HEP given as of last visit to maintain and progress currrent level of function     Time  6    Period  Weeks    Status  New    Target Date  01/28/18            Plan - 12/24/17 1311    Clinical Impression Statement  Pt with limited tolerance to exercises due to pain and muscle spasms. Pt with multiple trigger points noted in right upper trap and over incision sites. Pt  arriving with 5/10 pain and with exercises her pain increased to 8/10. STW performed with limited pressure due to pain intolerance. Ice pack applied at end of session with pt reporting her pain declined.    Rehab Potential  Good    PT Frequency  3x / week    PT Treatment/Interventions  ADLs/Self Care Home Management;Cryotherapy;Electrical Stimulation;Iontophoresis 4mg /ml Dexamethasone;Ultrasound;Moist Heat;Passive range of motion;Dry needling;Taping;Manual techniques;Therapeutic exercise;Therapeutic activities;Patient/family education    PT Next Visit Plan  review/ update HEP, PROM/ AAROM, scapular setting, soft tissue work modalities for pain     PT Home Exercise Plan  shoulder table slides flexion/ abduction, isometrics flexion/ abduction/ IR/ER .    Consulted and Agree with Plan of Care  Patient       Patient will benefit from skilled therapeutic intervention in order to improve the following deficits and impairments:  Pain, Decreased strength, Impaired UE functional use, Increased fascial restricitons, Increased muscle spasms, Postural dysfunction, Improper body mechanics, Decreased endurance, Decreased activity tolerance  Visit Diagnosis: Chronic right shoulder pain  Muscle weakness (generalized)  Stiffness of right shoulder, not elsewhere classified     Problem List Patient Active Problem List   Diagnosis Date Noted  . Complete tear of right rotator cuff 07/22/2017    Class: Chronic  . Retained orthopedic hardware 07/22/2017    Class: Chronic  . Cervical disc herniation 07/21/2017    Class: Acute  . Status post cervical spinal fusion 07/21/2017  . Chronic back pain     Oretha Caprice, MPT 12/24/2017, 1:27 PM  [facility] [address] [place], [place], [address] Phone: [phone]   Fax:  [phone]  Name: DENICE CARDON MRN: [id number] Date of Birth: [birth year]

## 2017-12-25 ENCOUNTER — Ambulatory Visit: Payer: Medicare Other | Admitting: Physical Therapy

## 2017-12-26 ENCOUNTER — Encounter

## 2017-12-30 ENCOUNTER — Encounter: Payer: Self-pay | Admitting: Physical Therapy

## 2017-12-30 ENCOUNTER — Ambulatory Visit: Payer: Medicare Other | Admitting: Physical Therapy

## 2017-12-30 DIAGNOSIS — M25611 Stiffness of right shoulder, not elsewhere classified: Secondary | ICD-10-CM

## 2017-12-30 DIAGNOSIS — G8929 Other chronic pain: Secondary | ICD-10-CM

## 2017-12-30 DIAGNOSIS — M25511 Pain in right shoulder: Principal | ICD-10-CM

## 2017-12-30 DIAGNOSIS — M6281 Muscle weakness (generalized): Secondary | ICD-10-CM

## 2017-12-30 NOTE — Therapy (Signed)
[place], [place], [address] Phone: [phone]   Fax:  [phone]  Physical Therapy Treatment  Patient Details  Name: [name] MRN: [id number] Date of Birth: 01-20-1966 Referring Provider: Tonna Corner dean MD   Encounter Date: 12/30/2017  PT End of Session - 12/30/17 1153    Visit Number  4    Number of Visits  18    Date for PT Re-Evaluation  01/28/18    Authorization Type  MCR: Kx mod byt 15th visit, progress note by 10th visit.    PT Start Time  1146    PT Stop Time  1230    PT Time Calculation (min)  44 min    Activity Tolerance  Patient tolerated treatment well    Behavior During Therapy  WFL for tasks assessed/performed       Past Medical History:  Diagnosis Date  . Anxiety   . Arthritis    rheumatoid , osteoarthritis  . Bronchitis   . Chronic back pain   . Depression   . Diabetes mellitus   . Fatty liver   . Fibromyalgia   . GERD (gastroesophageal reflux disease)   . Headache   . Hypertension   . Pancreatitis   . Pneumonia   . Rotator cuff tear, right     Past Surgical History:  Procedure Laterality Date  . ABDOMINAL HYSTERECTOMY    . ANTERIOR CERVICAL DECOMP/DISCECTOMY FUSION N/A 07/21/2017   Procedure: ANTERIOR CERVICAL DISCECTOMY AND FUSION C6-7, REMOVAL OF SCREW C6 PLATE, DEPUY ZERO-P IMPLANT, LOCAL BONE GRAFT, ALLOGRAFT BONE GRAFT, VIVIGEN;  Surgeon: Jessy Oto, MD;  Location: [facility];  Service: Orthopedics;  Laterality: N/A;  . BACK SURGERY     laminectomy  . BONE GRAFT HIP ILIAC CREST    . CARPAL TUNNEL RELEASE    . CERVICAL DISCECTOMY  07/21/2017  . CESAREAN SECTION    . CHOLECYSTECTOMY    . COLONOSCOPY    . frontal fusion    . neck fusion     patient reported  . SHOULDER ARTHROSCOPY WITH SUBACROMIAL DECOMPRESSION, ROTATOR CUFF REPAIR AND BICEP TENDON REPAIR Right 10/31/2017   Procedure: RIGHT SHOULDER ARTHROSCOPY, MANIPULATION UNDER ANESTHESIA, BICEPS  TENODESIS, AND MINI OPEN ROTATOR CUFF TEAR REPAIR;  Surgeon: Meredith Pel, MD;  Location: [facility];  Service: Orthopedics;  Laterality: Right;    There were no vitals filed for this visit.  Subjective Assessment - 12/30/17 1151    Subjective  "I had a really bad weekend, I am still having trouble sleeping and having significant pain"     Currently in Pain?  Yes    Pain Score  8     Pain Orientation  Right    Pain Descriptors / Indicators  Aching;Sore;Spasm    Pain Onset  More than a month ago    Pain Frequency  Constant    Aggravating Factors   using the R arm, getting up in the AM    Pain Relieving Factors  medication,                        [facility] Adult PT Treatment/Exercise - 12/30/17 0001      Shoulder Exercises: Pulleys   Flexion  3 minutes avoiding shoulder hiking      Shoulder Exercises: Stretch   Other Shoulder Stretches  upper trap stretch x 2 reps for proper form      Modalities   Modalities  Moist  Heat;Electrical Stimulation      Moist Heat Therapy   Number Minutes Moist Heat  15 Minutes    Moist Heat Location  Shoulder      Electrical Stimulation   Electrical Stimulation Location  R shoulder    Electrical Stimulation Action  IFC    Electrical Stimulation Parameters  L7 x 15 min 100% scan     Electrical Stimulation Goals  Pain      Manual Therapy   Manual Therapy  Passive ROM    Passive ROM  shoulder flexion/ abduction with grade 1 distraction oscillations for pain relief             PT Education - 12/30/17 1221    Education provided  Yes    Education Details  discussed with pt progress and if continued pain and limited mobility than we will have to refer her back to her MD     Person(s) Educated  Patient    Methods  Explanation    Comprehension  Verbalized understanding;Verbal cues required       PT Short Term Goals - 12/24/17 1314      PT SHORT TERM GOAL #1   Title  pt to be I with inital HEP     Time  3    Period  Weeks     Status  On-going      PT SHORT TERM GOAL #2   Title  pt to vebralize and demo techniques to reduce inflammation via RICE and HEP     Time  3    Period  Weeks    Status  New        PT Long Term Goals - 12/17/17 1252      PT LONG TERM GOAL #1   Title  improve R shoulder flexion/ abduction to >/= 130 degrees and shoulder IR/ER to >/= 50 degrees ( assessed at 90/90) for functional mobility required for ADLs with </= 2/10 pain     Time  6    Period  Weeks    Status  New    Target Date  01/28/18      PT LONG TERM GOAL #2   Title  increase R shoulder strength to >/= 4/5 in all planes to promote shoulder stability with lifting and carrying activtieis     Time  6    Period  Weeks    Status  New    Target Date  01/28/18      PT LONG TERM GOAL #3   Title  pt be able to lift/ lower >/= 8# to and from overhead hself and push/pull >/=10# reporting </=2/10 pain for funtional strength required for ADLs    Time  6    Period  Weeks    Status  New    Target Date  01/28/18      PT LONG TERM GOAL #4   Title  pt to increase FOTO score to </= 38% limited to demo improvement in function     Time  6    Period  Weeks    Status  New    Target Date  01/28/18      PT LONG TERM GOAL #5   Title  pt to be I with all HEP given as of last visit to maintain and progress currrent level of function     Time  6    Period  Weeks    Status  New    Target Date  01/28/18  Plan - 12/30/17 1222    Clinical Impression Statement  pt continues to report significant pain response in the R shoulder with limited shoulder PROM/AAROM with significant guarding. trialed PROM with grade 1 distraction for pain relief but pt has difficulty relaxing. utilized MHp with e-stim to calm down pain. discussed if continued pain and limited mobility in the next few visits then we will plan to send her back to her MD for further assessment.     PT Treatment/Interventions  ADLs/Self Care Home  Management;Cryotherapy;Electrical Stimulation;Iontophoresis 4mg /ml Dexamethasone;Ultrasound;Moist Heat;Passive range of motion;Dry needling;Taping;Manual techniques;Therapeutic exercise;Therapeutic activities;Patient/family education    PT Next Visit Plan  review/ update HEP, PROM/ AAROM, scapular setting, soft tissue work modalities for pain     PT Home Exercise Plan  shoulder table slides flexion/ abduction, isometrics flexion/ abduction/ IR/ER .    Consulted and Agree with Plan of Care  Patient       Patient will benefit from skilled therapeutic intervention in order to improve the following deficits and impairments:  Pain, Decreased strength, Impaired UE functional use, Increased fascial restricitons, Increased muscle spasms, Postural dysfunction, Improper body mechanics, Decreased endurance, Decreased activity tolerance  Visit Diagnosis: Chronic right shoulder pain  Muscle weakness (generalized)  Stiffness of right shoulder, not elsewhere classified     Problem List Patient Active Problem List   Diagnosis Date Noted  . Complete tear of right rotator cuff 07/22/2017    Class: Chronic  . Retained orthopedic hardware 07/22/2017    Class: Chronic  . Cervical disc herniation 07/21/2017    Class: Acute  . Status post cervical spinal fusion 07/21/2017  . Chronic back pain    Starr Lake PT, DPT, LAT, ATC  12/30/17  12:25 PM      [facility] [address] [place], [place], [address] Phone: [phone]   Fax:  [phone]  Name: [name] MRN: [id number] Date of Birth: November 22, 1965

## 2018-01-01 ENCOUNTER — Ambulatory Visit: Payer: Medicare Other | Admitting: Physical Therapy

## 2018-01-01 ENCOUNTER — Encounter: Payer: Self-pay | Admitting: Physical Therapy

## 2018-01-01 DIAGNOSIS — M6281 Muscle weakness (generalized): Secondary | ICD-10-CM

## 2018-01-01 DIAGNOSIS — M25511 Pain in right shoulder: Secondary | ICD-10-CM | POA: Diagnosis not present

## 2018-01-01 DIAGNOSIS — M25611 Stiffness of right shoulder, not elsewhere classified: Secondary | ICD-10-CM

## 2018-01-01 DIAGNOSIS — G8929 Other chronic pain: Secondary | ICD-10-CM

## 2018-01-01 NOTE — Patient Instructions (Addendum)

## 2018-01-01 NOTE — Therapy (Signed)
[place], [place], [address] Phone: [phone]   Fax:  [phone]  Physical Therapy Treatment  Patient Details  Name: [name] MRN: [id number] Date of Birth: 21-Mar-1966 Referring Provider: Tonna Corner dean MD   Encounter Date: 01/01/2018  PT End of Session - 01/01/18 1153    Visit Number  5    Number of Visits  18    Date for PT Re-Evaluation  01/28/18    Authorization Type  MCR: Kx mod byt 15th visit, progress note by 10th visit.    PT Start Time  1148    PT Stop Time  1242    PT Time Calculation (min)  54 min    Activity Tolerance  Patient tolerated treatment well    Behavior During Therapy  WFL for tasks assessed/performed       Past Medical History:  Diagnosis Date  . Anxiety   . Arthritis    rheumatoid , osteoarthritis  . Bronchitis   . Chronic back pain   . Depression   . Diabetes mellitus   . Fatty liver   . Fibromyalgia   . GERD (gastroesophageal reflux disease)   . Headache   . Hypertension   . Pancreatitis   . Pneumonia   . Rotator cuff tear, right     Past Surgical History:  Procedure Laterality Date  . ABDOMINAL HYSTERECTOMY    . ANTERIOR CERVICAL DECOMP/DISCECTOMY FUSION N/A 07/21/2017   Procedure: ANTERIOR CERVICAL DISCECTOMY AND FUSION C6-7, REMOVAL OF SCREW C6 PLATE, DEPUY ZERO-P IMPLANT, LOCAL BONE GRAFT, ALLOGRAFT BONE GRAFT, VIVIGEN;  Surgeon: Jessy Oto, MD;  Location: [facility];  Service: Orthopedics;  Laterality: N/A;  . BACK SURGERY     laminectomy  . BONE GRAFT HIP ILIAC CREST    . CARPAL TUNNEL RELEASE    . CERVICAL DISCECTOMY  07/21/2017  . CESAREAN SECTION    . CHOLECYSTECTOMY    . COLONOSCOPY    . frontal fusion    . neck fusion     patient reported  . SHOULDER ARTHROSCOPY WITH SUBACROMIAL DECOMPRESSION, ROTATOR CUFF REPAIR AND BICEP TENDON REPAIR Right 10/31/2017   Procedure: RIGHT SHOULDER ARTHROSCOPY, MANIPULATION UNDER ANESTHESIA, BICEPS  TENODESIS, AND MINI OPEN ROTATOR CUFF TEAR REPAIR;  Surgeon: Meredith Pel, MD;  Location: [facility];  Service: Orthopedics;  Laterality: Right;    There were no vitals filed for this visit.  Subjective Assessment - 01/01/18 1152    Subjective  " the e-stim from the last session really helped, I really don't have any pain today"     Currently in Pain?  No/denies                       [facility] Adult PT Treatment/Exercise - 01/01/18 1154      Elbow Exercises   Elbow Flexion  20 reps 3# x 2 sets      Shoulder Exercises: Pulleys   Flexion  3 minutes    Scaption  3 minutes      Shoulder Exercises: ROM/Strengthening   Other ROM/Strengthening Exercises  UE Ranger protraction 2 x 10, and horizontal abd 2 x 10      Shoulder Exercises: Isometric Strengthening   Flexion  5X10"    Extension  5X10"    External Rotation  5X10"    Internal Rotation  5X10"      Electrical Stimulation   Electrical Stimulation Location  R shoulder    Electrical  Stimulation Action  IFC    Electrical Stimulation Parameters  L9 x 67min 100% scan    Electrical Stimulation Goals  Pain      Manual Therapy   Passive ROM  shoulder flexion/ abduction with grade 1 distraction oscillations for pain relief               PT Short Term Goals - 12/24/17 1314      PT SHORT TERM GOAL #1   Title  pt to be I with inital HEP     Time  3    Period  Weeks    Status  On-going      PT SHORT TERM GOAL #2   Title  pt to vebralize and demo techniques to reduce inflammation via RICE and HEP     Time  3    Period  Weeks    Status  New        PT Long Term Goals - 12/17/17 1252      PT LONG TERM GOAL #1   Title  improve R shoulder flexion/ abduction to >/= 130 degrees and shoulder IR/ER to >/= 50 degrees ( assessed at 90/90) for functional mobility required for ADLs with </= 2/10 pain     Time  6    Period  Weeks    Status  New    Target Date  01/28/18      PT LONG TERM GOAL #2   Title   increase R shoulder strength to >/= 4/5 in all planes to promote shoulder stability with lifting and carrying activtieis     Time  6    Period  Weeks    Status  New    Target Date  01/28/18      PT LONG TERM GOAL #3   Title  pt be able to lift/ lower >/= 8# to and from overhead hself and push/pull >/=10# reporting </=2/10 pain for funtional strength required for ADLs    Time  6    Period  Weeks    Status  New    Target Date  01/28/18      PT LONG TERM GOAL #4   Title  pt to increase FOTO score to </= 38% limited to demo improvement in function     Time  6    Period  Weeks    Status  New    Target Date  01/28/18      PT LONG TERM GOAL #5   Title  pt to be I with all HEP given as of last visit to maintain and progress currrent level of function     Time  6    Period  Weeks    Status  New    Target Date  01/28/18            Plan - 01/01/18 1228    Clinical Impression Statement  [name] reports relief of pain since the last session which she attributes to the TENS unit. she continues to exhibit significant apprehension/ guarding due to pain with movement. continued PROM and AAROM exericse and strengthening which she requires frequent cues for focus on exercise versus pain. continued E-stim end of session and provided handout for where she can find a TENS unit.     PT Treatment/Interventions  ADLs/Self Care Home Management;Cryotherapy;Electrical Stimulation;Iontophoresis 4mg /ml Dexamethasone;Ultrasound;Moist Heat;Passive range of motion;Dry needling;Taping;Manual techniques;Therapeutic exercise;Therapeutic activities;Patient/family education    PT Next Visit Plan  review/ update HEP, PROM/ AAROM, scapular setting, soft  tissue work modalities for pain,     PT Home Exercise Plan  shoulder table slides flexion/ abduction, isometrics flexion/ abduction/ IR/ER .    Consulted and Agree with Plan of Care  Patient       Patient will benefit from skilled therapeutic intervention in  order to improve the following deficits and impairments:  Pain, Decreased strength, Impaired UE functional use, Increased fascial restricitons, Increased muscle spasms, Postural dysfunction, Improper body mechanics, Decreased endurance, Decreased activity tolerance  Visit Diagnosis: Chronic right shoulder pain  Muscle weakness (generalized)  Stiffness of right shoulder, not elsewhere classified     Problem List Patient Active Problem List   Diagnosis Date Noted  . Complete tear of right rotator cuff 07/22/2017    Class: Chronic  . Retained orthopedic hardware 07/22/2017    Class: Chronic  . Cervical disc herniation 07/21/2017    Class: Acute  . Status post cervical spinal fusion 07/21/2017  . Chronic back pain    Starr Lake PT, DPT, LAT, ATC  01/01/18  12:35 PM      [facility] [address] [place], [place], [address] Phone: [phone]   Fax:  [phone]  Name: [name] MRN: [id number] Date of Birth: Sep 16, 1965

## 2018-01-02 ENCOUNTER — Encounter

## 2018-01-07 ENCOUNTER — Encounter: Payer: Self-pay | Admitting: Physical Therapy

## 2018-01-07 ENCOUNTER — Ambulatory Visit: Payer: Medicare Other | Admitting: Physical Therapy

## 2018-01-07 DIAGNOSIS — M25511 Pain in right shoulder: Secondary | ICD-10-CM | POA: Diagnosis not present

## 2018-01-07 DIAGNOSIS — G8929 Other chronic pain: Secondary | ICD-10-CM

## 2018-01-07 DIAGNOSIS — M6281 Muscle weakness (generalized): Secondary | ICD-10-CM

## 2018-01-07 NOTE — Therapy (Signed)
[place], [place], [address] Phone: [phone]   Fax:  [phone]  Physical Therapy Treatment  Patient Details  Name: [name] MRN: [id number] Date of Birth: Sep 10, 1965 Referring Provider: Tonna Corner dean MD   Encounter Date: 01/07/2018  PT End of Session - 01/07/18 1100    Visit Number  6    Number of Visits  18    Date for PT Re-Evaluation  01/28/18    Authorization Type  MCR: Kx mod byt 15th visit, progress note by 10th visit.    PT Start Time  1055    PT Stop Time  1149    PT Time Calculation (min)  54 min       Past Medical History:  Diagnosis Date  . Anxiety   . Arthritis    rheumatoid , osteoarthritis  . Bronchitis   . Chronic back pain   . Depression   . Diabetes mellitus   . Fatty liver   . Fibromyalgia   . GERD (gastroesophageal reflux disease)   . Headache   . Hypertension   . Pancreatitis   . Pneumonia   . Rotator cuff tear, right     Past Surgical History:  Procedure Laterality Date  . ABDOMINAL HYSTERECTOMY    . ANTERIOR CERVICAL DECOMP/DISCECTOMY FUSION N/A 07/21/2017   Procedure: ANTERIOR CERVICAL DISCECTOMY AND FUSION C6-7, REMOVAL OF SCREW C6 PLATE, DEPUY ZERO-P IMPLANT, LOCAL BONE GRAFT, ALLOGRAFT BONE GRAFT, VIVIGEN;  Surgeon: Jessy Oto, MD;  Location: [facility];  Service: Orthopedics;  Laterality: N/A;  . BACK SURGERY     laminectomy  . BONE GRAFT HIP ILIAC CREST    . CARPAL TUNNEL RELEASE    . CERVICAL DISCECTOMY  07/21/2017  . CESAREAN SECTION    . CHOLECYSTECTOMY    . COLONOSCOPY    . frontal fusion    . neck fusion     patient reported  . SHOULDER ARTHROSCOPY WITH SUBACROMIAL DECOMPRESSION, ROTATOR CUFF REPAIR AND BICEP TENDON REPAIR Right 10/31/2017   Procedure: RIGHT SHOULDER ARTHROSCOPY, MANIPULATION UNDER ANESTHESIA, BICEPS TENODESIS, AND MINI OPEN ROTATOR CUFF TEAR REPAIR;  Surgeon: Meredith Pel, MD;  Location: [facility];  Service:  Orthopedics;  Laterality: Right;    There were no vitals filed for this visit.  Subjective Assessment - 01/07/18 1058    Subjective  Aching, dull pain, tingling. 6/10.     Currently in Pain?  Yes    Pain Score  6     Pain Location  Shoulder    Pain Orientation  Right    Pain Descriptors / Indicators  Aching;Dull;Tingling    Aggravating Factors   reaching, showering, making the bed, laying in the bed, doing hair     Pain Relieving Factors  tens, meds          [facility] PT Assessment - 01/07/18 0001      AROM   Right Shoulder Flexion  -- 95 AAROM supine      PROM   Right Shoulder Flexion  95 Degrees ERP    Right Shoulder ABduction  70 Degrees ERP    Right Shoulder External Rotation  30 Degrees ERP , @ 35 degrees abduction                   [facility] Adult PT Treatment/Exercise - 01/07/18 0001      Shoulder Exercises: Supine   External Rotation  AAROM    Other Supine Exercises  shoulder presses  with hand clasps, then over head       Shoulder Exercises: Pulleys   Flexion  3 minutes    Scaption  3 minutes      Shoulder Exercises: ROM/Strengthening   Other ROM/Strengthening Exercises  UE ranger standing cane AAROM flexion and abduction, ER       Shoulder Exercises: Isometric Strengthening   Flexion  5X10"    Extension  5X10"    External Rotation  5X10"    Internal Rotation  5X10"      Shoulder Exercises: Stretch   Other Shoulder Stretches  upper trap stretch x 2 reps for proper form      Electrical Stimulation   Electrical Stimulation Location  R shoulder    Electrical Stimulation Action  IFC x 15 min    Electrical Stimulation Parameters  L9    Electrical Stimulation Goals  Pain      Manual Therapy   Passive ROM  shoulder flexion, abduction, ER to 30 degrees               PT Short Term Goals - 12/24/17 1314      PT SHORT TERM GOAL #1   Title  pt to be I with inital HEP     Time  3    Period  Weeks    Status  On-going      PT SHORT TERM GOAL  #2   Title  pt to vebralize and demo techniques to reduce inflammation via RICE and HEP     Time  3    Period  Weeks    Status  New        PT Long Term Goals - 12/17/17 1252      PT LONG TERM GOAL #1   Title  improve R shoulder flexion/ abduction to >/= 130 degrees and shoulder IR/ER to >/= 50 degrees ( assessed at 90/90) for functional mobility required for ADLs with </= 2/10 pain     Time  6    Period  Weeks    Status  New    Target Date  01/28/18      PT LONG TERM GOAL #2   Title  increase R shoulder strength to >/= 4/5 in all planes to promote shoulder stability with lifting and carrying activtieis     Time  6    Period  Weeks    Status  New    Target Date  01/28/18      PT LONG TERM GOAL #3   Title  pt be able to lift/ lower >/= 8# to and from overhead hself and push/pull >/=10# reporting </=2/10 pain for funtional strength required for ADLs    Time  6    Period  Weeks    Status  New    Target Date  01/28/18      PT LONG TERM GOAL #4   Title  pt to increase FOTO score to </= 38% limited to demo improvement in function     Time  6    Period  Weeks    Status  New    Target Date  01/28/18      PT LONG TERM GOAL #5   Title  pt to be I with all HEP given as of last visit to maintain and progress currrent level of function     Time  6    Period  Weeks    Status  New    Target Date  01/28/18  Plan - 01/07/18 1238    Clinical Impression Statement  Pt reports no increased pain after last session. Today she rates pain at 6/10. Reviewed standing cane exercises issued last visit. She reports only performing table slides at home since evaluation. In supine she has back pain however she is able to perform AAROM flexion to 95 degrees and ER to 30 degrees without increased pain. After PROM, pt reports increased pain. IFC and HMP repeated to decrease pain. After IFC, she reports shoulder feels better.     PT Next Visit Plan  review/ update HEP, PROM/ AAROM,  scapular setting, soft tissue work modalities for pain,     PT Home Exercise Plan  shoulder table slides flexion/ abduction, isometrics flexion/ abduction/ IR/ER .    Consulted and Agree with Plan of Care  Patient       Patient will benefit from skilled therapeutic intervention in order to improve the following deficits and impairments:  Pain, Decreased strength, Impaired UE functional use, Increased fascial restricitons, Increased muscle spasms, Postural dysfunction, Improper body mechanics, Decreased endurance, Decreased activity tolerance  Visit Diagnosis: Chronic right shoulder pain  Muscle weakness (generalized)     Problem List Patient Active Problem List   Diagnosis Date Noted  . Complete tear of right rotator cuff 07/22/2017    Class: Chronic  . Retained orthopedic hardware 07/22/2017    Class: Chronic  . Cervical disc herniation 07/21/2017    Class: Acute  . Status post cervical spinal fusion 07/21/2017  . Chronic back pain     Dorene Ar, [place] 01/07/2018, 12:44 PM  [place], [place], [address] Phone: [phone]   Fax:  [phone]  Name: [name] MRN: [id number] Date of Birth: [birth year]

## 2018-01-08 ENCOUNTER — Ambulatory Visit: Payer: Medicare Other | Admitting: Physical Therapy

## 2018-01-09 ENCOUNTER — Telehealth: Payer: Self-pay | Admitting: Physical Therapy

## 2018-01-09 ENCOUNTER — Ambulatory Visit: Payer: Medicare Other | Admitting: Physical Therapy

## 2018-01-09 NOTE — Telephone Encounter (Signed)
Left message regarding no show to appointment today. Asked her to call if she cannot make her next appointment.

## 2018-01-12 ENCOUNTER — Encounter: Payer: Self-pay | Admitting: Physical Therapy

## 2018-01-12 ENCOUNTER — Ambulatory Visit: Payer: Medicare Other | Attending: Orthopedic Surgery | Admitting: Physical Therapy

## 2018-01-12 ENCOUNTER — Encounter (INDEPENDENT_AMBULATORY_CARE_PROVIDER_SITE_OTHER): Payer: Self-pay | Admitting: Orthopedic Surgery

## 2018-01-12 ENCOUNTER — Ambulatory Visit (INDEPENDENT_AMBULATORY_CARE_PROVIDER_SITE_OTHER): Payer: Medicare Other | Admitting: Orthopedic Surgery

## 2018-01-12 DIAGNOSIS — G8929 Other chronic pain: Secondary | ICD-10-CM | POA: Diagnosis present

## 2018-01-12 DIAGNOSIS — M25611 Stiffness of right shoulder, not elsewhere classified: Secondary | ICD-10-CM | POA: Diagnosis present

## 2018-01-12 DIAGNOSIS — M25511 Pain in right shoulder: Secondary | ICD-10-CM | POA: Insufficient documentation

## 2018-01-12 DIAGNOSIS — Z9889 Other specified postprocedural states: Secondary | ICD-10-CM

## 2018-01-12 DIAGNOSIS — M6281 Muscle weakness (generalized): Secondary | ICD-10-CM | POA: Insufficient documentation

## 2018-01-12 MED ORDER — HYDROCODONE-ACETAMINOPHEN 5-325 MG PO TABS: ORAL_TABLET | ORAL | 0 refills | Status: DC

## 2018-01-12 NOTE — Patient Instructions (Signed)
See  Patient education section

## 2018-01-12 NOTE — Therapy (Signed)
[place], [place], [address] Phone: [phone]   Fax:  [phone]  Physical Therapy Treatment  Patient Details  Name: [name] MRN: [id number] Date of Birth: 1966/01/27 Referring Provider: Tonna Corner dean MD   Encounter Date: 01/12/2018  PT End of Session - 01/12/18 1308    Visit Number  7    Number of Visits  18    Date for PT Re-Evaluation  01/28/18    PT Start Time  1148    PT Stop Time  1250    PT Time Calculation (min)  62 min    Activity Tolerance  Patient tolerated treatment well;Patient limited by pain    Behavior During Therapy  [facility] for tasks assessed/performed       Past Medical History:  Diagnosis Date  . Anxiety   . Arthritis    rheumatoid , osteoarthritis  . Bronchitis   . Chronic back pain   . Depression   . Diabetes mellitus   . Fatty liver   . Fibromyalgia   . GERD (gastroesophageal reflux disease)   . Headache   . Hypertension   . Pancreatitis   . Pneumonia   . Rotator cuff tear, right     Past Surgical History:  Procedure Laterality Date  . ABDOMINAL HYSTERECTOMY    . ANTERIOR CERVICAL DECOMP/DISCECTOMY FUSION N/A 07/21/2017   Procedure: ANTERIOR CERVICAL DISCECTOMY AND FUSION C6-7, REMOVAL OF SCREW C6 PLATE, DEPUY ZERO-P IMPLANT, LOCAL BONE GRAFT, ALLOGRAFT BONE GRAFT, VIVIGEN;  Surgeon: Jessy Oto, MD;  Location: [facility];  Service: Orthopedics;  Laterality: N/A;  . BACK SURGERY     laminectomy  . BONE GRAFT HIP ILIAC CREST    . CARPAL TUNNEL RELEASE    . CERVICAL DISCECTOMY  07/21/2017  . CESAREAN SECTION    . CHOLECYSTECTOMY    . COLONOSCOPY    . frontal fusion    . neck fusion     patient reported  . SHOULDER ARTHROSCOPY WITH SUBACROMIAL DECOMPRESSION, ROTATOR CUFF REPAIR AND BICEP TENDON REPAIR Right 10/31/2017   Procedure: RIGHT SHOULDER ARTHROSCOPY, MANIPULATION UNDER ANESTHESIA, BICEPS TENODESIS, AND MINI OPEN ROTATOR CUFF TEAR REPAIR;  Surgeon:  Meredith Pel, MD;  Location: [facility];  Service: Orthopedics;  Laterality: Right;    There were no vitals filed for this visit.  Subjective Assessment - 01/12/18 1152    Subjective  Anterior shoulder feels funny anterior shoulder since last visit.Seh is doing some of the exercies.    Currently in Pain?  Yes    Pain Score  7     Pain Location  Shoulder    Pain Orientation  Right;Anterior    Pain Descriptors / Indicators  Shooting;Aching;Numbness    Pain Radiating Towards  to wrist,  across collar bone    Aggravating Factors   sleeping with arm behing,  dressing , adL's difficultreaching    Pain Relieving Factors  TENS,  meds  heat    Effect of Pain on Daily Activities  not sleeping good         [facility] PT Assessment - 01/12/18 0001      PROM   Right Shoulder Flexion  90 Degrees    Right Shoulder ABduction  65 Degrees    Right Shoulder External Rotation  30 Degrees at 45 abduction                   [facility] Adult PT Treatment/Exercise - 01/12/18 0001  Self-Care   Self-Care  Other Self-Care Comments precautions,  scar tissue work how to     Other Self-Care Comments   What to expect.  Some pain with shoulder rehab  is not harmful.  Soft tissue work would be helpful as well as use of heat and Ice,  keeping up with the HEP important pain control      Moist Heat Therapy   Number Minutes Moist Heat  15 Minutes    Moist Heat Location  Shoulder concurrent with IFC      Electrical Stimulation   Electrical Stimulation Location  right shoulder    Electrical Stimulation Action  IFC X 15 minutes    Electrical Stimulation Parameters  L8    Electrical Stimulation Goals  Pain      Manual Therapy   Manual Therapy  Passive ROM    Soft tissue mobilization  right upper quadrant,,  Light touch only tolerates tissue softende some,  However pain increased    Passive ROM  shoulder flexion, abduction, ER to 30 degrees             PT Education - 01/12/18 1305    Education  provided  Yes    Education Details  How to exercise.  How to manage pain.  Also issued General Fibromyalgia information( from the internet) at patient's request.    Methods  Explanation;Demonstration;Verbal cues;Handout    Comprehension  Returned demonstration;Verbalized understanding       PT Short Term Goals - 01/12/18 1312      PT SHORT TERM GOAL #1   Title  pt to be I with inital HEP     Baseline  does some of the exercises    Time  3    Period  Weeks    Status  On-going      PT SHORT TERM GOAL #2   Title  pt to vebralize and demo techniques to reduce inflammation via RICE and HEP     Baseline  education continues on this topic    Time  3    Period  Weeks    Status  On-going      PT SHORT TERM GOAL #3   Title  pt to increase R grip strength by >/= 10# to demo shoulder function improvement    Time  3    Period  Weeks    Status  Unable to assess        PT Long Term Goals - 12/17/17 1252      PT LONG TERM GOAL #1   Title  improve R shoulder flexion/ abduction to >/= 130 degrees and shoulder IR/ER to >/= 50 degrees ( assessed at 90/90) for functional mobility required for ADLs with </= 2/10 pain     Time  6    Period  Weeks    Status  New    Target Date  01/28/18      PT LONG TERM GOAL #2   Title  increase R shoulder strength to >/= 4/5 in all planes to promote shoulder stability with lifting and carrying activtieis     Time  6    Period  Weeks    Status  New    Target Date  01/28/18      PT LONG TERM GOAL #3   Title  pt be able to lift/ lower >/= 8# to and from overhead hself and push/pull >/=10# reporting </=2/10 pain for funtional strength required for ADLs    Time  6    Period  Weeks    Status  New    Target Date  01/28/18      PT LONG TERM GOAL #4   Title  pt to increase FOTO score to </= 38% limited to demo improvement in function     Time  6    Period  Weeks    Status  New    Target Date  01/28/18      PT LONG TERM GOAL #5   Title  pt to be I with  all HEP given as of last visit to maintain and progress currrent level of function     Time  6    Period  Weeks    Status  New    Target Date  01/28/18            Plan - 01/12/18 1308    Clinical Impression Statement  Patient continues to have reports of increased pain. Pain limited exercise today.  Pain increased from 7/10 to 9/10 at end of manual.  Pain increased in low back after trying to get her supine for exercises.  Modalities helpful.  Flexion 90 PROM with pain.   patient emotional and tearful during session about many topics and stress.    PT Next Visit Plan  see what MD said.  Continue PROM AAROM avoid supine positioning because of her back    PT Home Exercise Plan  shoulder table slides flexion/ abduction, isometrics flexion/ abduction/ IR/ER .    Consulted and Agree with Plan of Care  Patient       Patient will benefit from skilled therapeutic intervention in order to improve the following deficits and impairments:     Visit Diagnosis: Chronic right shoulder pain  Muscle weakness (generalized)  Stiffness of right shoulder, not elsewhere classified     Problem List Patient Active Problem List   Diagnosis Date Noted  . Complete tear of right rotator cuff 07/22/2017    Class: Chronic  . Retained orthopedic hardware 07/22/2017    Class: Chronic  . Cervical disc herniation 07/21/2017    Class: Acute  . Status post cervical spinal fusion 07/21/2017  . Chronic back pain     HARRIS,KAREN PTA 01/12/2018, 1:15 PM  [facility] [address] [place], [place], [address] Phone: [phone]   Fax:  [phone]  Name: [name] MRN: [id number] Date of Birth: April 06, 1966

## 2018-01-13 ENCOUNTER — Encounter (INDEPENDENT_AMBULATORY_CARE_PROVIDER_SITE_OTHER): Payer: Self-pay | Admitting: Orthopedic Surgery

## 2018-01-13 NOTE — Progress Notes (Signed)
Post-Op Visit Note   [name]           Date of Birth: 1965/09/05           MRN: [id number] Visit Date: 01/12/2018 PCP: Nolene Ebbs, MD   Assessment & Plan:  Chief Complaint:  Chief Complaint  Patient presents with  . Right Shoulder - Follow-up   Visit Diagnoses:  1. S/P rotator cuff repair     Plan: [name] is a patient who is now about 2 months out right shoulder arthroscopy manipulation biceps tenodesis rotator cuff repair.  She is been in physical therapy 3 times a week.  She wants to take something not quite a strong for her pain.  On examination she is making some improvement with her motion but she still is relatively stiff.  Continue with therapy continue with range of motion exercises Norco prescribed but on a tapering dose.  Follow-up with me in 6 weeks for final check.  Follow-Up Instructions: Return in about 6 weeks (around 02/23/2018).   Orders:  No orders of the defined types were placed in this encounter.  Meds ordered this encounter  Medications  . HYDROcodone-acetaminophen (NORCO/VICODIN) 5-325 MG tablet    Sig: 1 po q 12hrs prn pain    Dispense:  35 tablet    Refill:  0    Imaging: No results found.  PMFS History: Patient Active Problem List   Diagnosis Date Noted  . Complete tear of right rotator cuff 07/22/2017    Class: Chronic  . Retained orthopedic hardware 07/22/2017    Class: Chronic  . Cervical disc herniation 07/21/2017    Class: Acute  . Status post cervical spinal fusion 07/21/2017  . Chronic back pain    Past Medical History:  Diagnosis Date  . Anxiety   . Arthritis    rheumatoid , osteoarthritis  . Bronchitis   . Chronic back pain   . Depression   . Diabetes mellitus   . Fatty liver   . Fibromyalgia   . GERD (gastroesophageal reflux disease)   . Headache   . Hypertension   . Pancreatitis   . Pneumonia   . Rotator cuff tear, right     Family History  Problem Relation Age of Onset  . Hypertension Father    . Renal Disease Father   . Diabetes Mother   . Hypertension Mother   . Edema Mother   . Diabetes Sister   . Diabetes Brother     Past Surgical History:  Procedure Laterality Date  . ABDOMINAL HYSTERECTOMY    . ANTERIOR CERVICAL DECOMP/DISCECTOMY FUSION N/A 07/21/2017   Procedure: ANTERIOR CERVICAL DISCECTOMY AND FUSION C6-7, REMOVAL OF SCREW C6 PLATE, DEPUY ZERO-P IMPLANT, LOCAL BONE GRAFT, ALLOGRAFT BONE GRAFT, VIVIGEN;  Surgeon: Jessy Oto, MD;  Location: [facility];  Service: Orthopedics;  Laterality: N/A;  . BACK SURGERY     laminectomy  . BONE GRAFT HIP ILIAC CREST    . CARPAL TUNNEL RELEASE    . CERVICAL DISCECTOMY  07/21/2017  . CESAREAN SECTION    . CHOLECYSTECTOMY    . COLONOSCOPY    . frontal fusion    . neck fusion     patient reported  . SHOULDER ARTHROSCOPY WITH SUBACROMIAL DECOMPRESSION, ROTATOR CUFF REPAIR AND BICEP TENDON REPAIR Right 10/31/2017   Procedure: RIGHT SHOULDER ARTHROSCOPY, MANIPULATION UNDER ANESTHESIA, BICEPS TENODESIS, AND MINI OPEN ROTATOR CUFF TEAR REPAIR;  Surgeon: Meredith Pel, MD;  Location: [facility];  Service: Orthopedics;  Laterality: Right;   Social History   Occupational History  . Not on file  Tobacco Use  . Smoking status: Current Every Day Smoker    Packs/day: 0.50    Types: Cigarettes  . Smokeless tobacco: Never Used  Substance and Sexual Activity  . Alcohol use: No  . Drug use: Yes    Types: Marijuana  . Sexual activity: Not on file

## 2018-01-14 ENCOUNTER — Encounter: Payer: Self-pay | Admitting: Physical Therapy

## 2018-01-14 ENCOUNTER — Ambulatory Visit: Payer: Medicare Other | Admitting: Physical Therapy

## 2018-01-14 DIAGNOSIS — M6281 Muscle weakness (generalized): Secondary | ICD-10-CM

## 2018-01-14 DIAGNOSIS — G8929 Other chronic pain: Secondary | ICD-10-CM

## 2018-01-14 DIAGNOSIS — M25611 Stiffness of right shoulder, not elsewhere classified: Secondary | ICD-10-CM

## 2018-01-14 DIAGNOSIS — M25511 Pain in right shoulder: Principal | ICD-10-CM

## 2018-01-14 NOTE — Therapy (Signed)
[place], [place], [address] Phone: [phone]   Fax:  [phone]  Physical Therapy Treatment  Patient Details  Name: [name] MRN: [id number] Date of Birth: Dec 22, 1965 Referring Provider: Tonna Corner dean MD   Encounter Date: 01/14/2018  PT End of Session - 01/14/18 1146    Visit Number  8    Number of Visits  18    Date for PT Re-Evaluation  01/28/18    Authorization Type  MCR: Kx mod byt 15th visit, progress note by 10th visit.    PT Start Time  1147    PT Stop Time  1228    PT Time Calculation (min)  41 min    Activity Tolerance  Patient tolerated treatment well    Behavior During Therapy  WFL for tasks assessed/performed       Past Medical History:  Diagnosis Date  . Anxiety   . Arthritis    rheumatoid , osteoarthritis  . Bronchitis   . Chronic back pain   . Depression   . Diabetes mellitus   . Fatty liver   . Fibromyalgia   . GERD (gastroesophageal reflux disease)   . Headache   . Hypertension   . Pancreatitis   . Pneumonia   . Rotator cuff tear, right     Past Surgical History:  Procedure Laterality Date  . ABDOMINAL HYSTERECTOMY    . ANTERIOR CERVICAL DECOMP/DISCECTOMY FUSION N/A 07/21/2017   Procedure: ANTERIOR CERVICAL DISCECTOMY AND FUSION C6-7, REMOVAL OF SCREW C6 PLATE, DEPUY ZERO-P IMPLANT, LOCAL BONE GRAFT, ALLOGRAFT BONE GRAFT, VIVIGEN;  Surgeon: Jessy Oto, MD;  Location: [place];  Service: Orthopedics;  Laterality: N/A;  . BACK SURGERY     laminectomy  . BONE GRAFT HIP ILIAC CREST    . CARPAL TUNNEL RELEASE    . CERVICAL DISCECTOMY  07/21/2017  . CESAREAN SECTION    . CHOLECYSTECTOMY    . COLONOSCOPY    . frontal fusion    . neck fusion     patient reported  . SHOULDER ARTHROSCOPY WITH SUBACROMIAL DECOMPRESSION, ROTATOR CUFF REPAIR AND BICEP TENDON REPAIR Right 10/31/2017   Procedure: RIGHT SHOULDER ARTHROSCOPY, MANIPULATION UNDER ANESTHESIA, BICEPS  TENODESIS, AND MINI OPEN ROTATOR CUFF TEAR REPAIR;  Surgeon: Meredith Pel, MD;  Location: [place];  Service: Orthopedics;  Laterality: Right;    There were no vitals filed for this visit.  Subjective Assessment - 01/14/18 1149    Subjective  "i saw the Md he thought I was doing good, said I needed to continue with PT"    Currently in Pain?  Yes    Pain Score  7     Pain Orientation  Right                       [facility] Adult PT Treatment/Exercise - 01/14/18 1150      Shoulder Exercises: Isometric Strengthening   Flexion  5X10"    Extension  5X10"    External Rotation  5X10"    Internal Rotation  5X10"      Moist Heat Therapy   Number Minutes Moist Heat  10 Minutes    Moist Heat Location  Shoulder      Electrical Stimulation   Electrical Stimulation Location  right shoulder    Electrical Stimulation Action  IFC    Electrical Stimulation Parameters  IFC x 15 min @ 100% scan    Electrical Stimulation Goals  Pain concurrent to PROM/ manual techniques      Manual Therapy   Manual Therapy  Joint mobilization    Joint Mobilization  grade 1-2 inferior/ posterior joint mobs    Soft tissue mobilization  STW along deltoid     Passive ROM  shoulder flexion, abduction, ER to 30 degrees             PT Education - 01/14/18 1230    Education provided  Yes    Education Details  discussed importance of doing exercise to promote mobility.     Person(s) Educated  Patient    Methods  Explanation;Verbal cues    Comprehension  Verbalized understanding;Verbal cues required       PT Short Term Goals - 01/12/18 1312      PT SHORT TERM GOAL #1   Title  pt to be I with inital HEP     Baseline  does some of the exercises    Time  3    Period  Weeks    Status  On-going      PT SHORT TERM GOAL #2   Title  pt to vebralize and demo techniques to reduce inflammation via RICE and HEP     Baseline  education continues on this topic    Time  3    Period  Weeks     Status  On-going      PT SHORT TERM GOAL #3   Title  pt to increase R grip strength by >/= 10# to demo shoulder function improvement    Time  3    Period  Weeks    Status  Unable to assess        PT Long Term Goals - 12/17/17 1252      PT LONG TERM GOAL #1   Title  improve R shoulder flexion/ abduction to >/= 130 degrees and shoulder IR/ER to >/= 50 degrees ( assessed at 90/90) for functional mobility required for ADLs with </= 2/10 pain     Time  6    Period  Weeks    Status  New    Target Date  01/28/18      PT LONG TERM GOAL #2   Title  increase R shoulder strength to >/= 4/5 in all planes to promote shoulder stability with lifting and carrying activtieis     Time  6    Period  Weeks    Status  New    Target Date  01/28/18      PT LONG TERM GOAL #3   Title  pt be able to lift/ lower >/= 8# to and from overhead hself and push/pull >/=10# reporting </=2/10 pain for funtional strength required for ADLs    Time  6    Period  Weeks    Status  New    Target Date  01/28/18      PT LONG TERM GOAL #4   Title  pt to increase FOTO score to </= 38% limited to demo improvement in function     Time  6    Period  Weeks    Status  New    Target Date  01/28/18      PT LONG TERM GOAL #5   Title  pt to be I with all HEP given as of last visit to maintain and progress currrent level of function     Time  6    Period  Weeks    Status  New  Target Date  01/28/18            Plan - 01/14/18 1231    Clinical Impression Statement  patient continues to report 7/10 pain and exhibit significant apprehension regarding pain and exhibits significant guarding which further impacts mobility and pain. She saw her MD and he noted some progress but continued stiffness.  trialed E-stim to calm down pain concurrent to PROM techniques which pt rquired multiple cues for relaxing breathing techniques. pt reports has been doing her exercises but when asked later stated she hasn't done her advanced  ROM exercises discussed importanced of doing her HEP.     PT Next Visit Plan  Continue PROM AAROM avoid supine positioning because of her back    Consulted and Agree with Plan of Care  Patient       Patient will benefit from skilled therapeutic intervention in order to improve the following deficits and impairments:  Pain, Decreased strength, Impaired UE functional use, Increased fascial restricitons, Increased muscle spasms, Postural dysfunction, Improper body mechanics, Decreased endurance, Decreased activity tolerance  Visit Diagnosis: Chronic right shoulder pain  Muscle weakness (generalized)  Stiffness of right shoulder, not elsewhere classified     Problem List Patient Active Problem List   Diagnosis Date Noted  . Complete tear of right rotator cuff 07/22/2017    Class: Chronic  . Retained orthopedic hardware 07/22/2017    Class: Chronic  . Cervical disc herniation 07/21/2017    Class: Acute  . Status post cervical spinal fusion 07/21/2017  . Chronic back pain    Starr Lake PT, DPT, LAT, ATC  01/14/18  12:36 PM      [facility] [address] [place], [place], [address] Phone: [phone]   Fax:  [phone]  Name: [name] MRN: [id number] Date of Birth: 06-08-1966

## 2018-01-16 ENCOUNTER — Ambulatory Visit: Payer: Medicare Other | Admitting: Physical Therapy

## 2018-01-19 ENCOUNTER — Ambulatory Visit: Payer: Medicare Other | Admitting: Physical Therapy

## 2018-01-19 DIAGNOSIS — M25511 Pain in right shoulder: Secondary | ICD-10-CM | POA: Diagnosis not present

## 2018-01-19 DIAGNOSIS — M6281 Muscle weakness (generalized): Secondary | ICD-10-CM

## 2018-01-19 DIAGNOSIS — M25611 Stiffness of right shoulder, not elsewhere classified: Secondary | ICD-10-CM

## 2018-01-19 DIAGNOSIS — G8929 Other chronic pain: Secondary | ICD-10-CM

## 2018-01-19 NOTE — Therapy (Signed)
[place], [place], [address] Phone: [phone]   Fax:  [phone]  Physical Therapy Treatment  Patient Details  Name: [name] MRN: [id number] Date of Birth: July 25, 1966 Referring Provider: Tonna Corner dean MD   Encounter Date: 01/19/2018  PT End of Session - 01/19/18 1133    Visit Number  9    Number of Visits  18    Date for PT Re-Evaluation  01/28/18    Authorization Type  MCR: Kx mod byt 15th visit, progress note by 10th visit.    PT Start Time  1130    PT Stop Time  1200    PT Time Calculation (min)  30 min       Past Medical History:  Diagnosis Date  . Anxiety   . Arthritis    rheumatoid , osteoarthritis  . Bronchitis   . Chronic back pain   . Depression   . Diabetes mellitus   . Fatty liver   . Fibromyalgia   . GERD (gastroesophageal reflux disease)   . Headache   . Hypertension   . Pancreatitis   . Pneumonia   . Rotator cuff tear, right     Past Surgical History:  Procedure Laterality Date  . ABDOMINAL HYSTERECTOMY    . ANTERIOR CERVICAL DECOMP/DISCECTOMY FUSION N/A 07/21/2017   Procedure: ANTERIOR CERVICAL DISCECTOMY AND FUSION C6-7, REMOVAL OF SCREW C6 PLATE, DEPUY ZERO-P IMPLANT, LOCAL BONE GRAFT, ALLOGRAFT BONE GRAFT, VIVIGEN;  Surgeon: Jessy Oto, MD;  Location: [place];  Service: Orthopedics;  Laterality: N/A;  . BACK SURGERY     laminectomy  . BONE GRAFT HIP ILIAC CREST    . CARPAL TUNNEL RELEASE    . CERVICAL DISCECTOMY  07/21/2017  . CESAREAN SECTION    . CHOLECYSTECTOMY    . COLONOSCOPY    . frontal fusion    . neck fusion     patient reported  . SHOULDER ARTHROSCOPY WITH SUBACROMIAL DECOMPRESSION, ROTATOR CUFF REPAIR AND BICEP TENDON REPAIR Right 10/31/2017   Procedure: RIGHT SHOULDER ARTHROSCOPY, MANIPULATION UNDER ANESTHESIA, BICEPS TENODESIS, AND MINI OPEN ROTATOR CUFF TEAR REPAIR;  Surgeon: Meredith Pel, MD;  Location: [place];  Service:  Orthopedics;  Laterality: Right;    There were no vitals filed for this visit.  Subjective Assessment - 01/19/18 1137    Subjective  I have been working on the exercises.     Currently in Pain?  No/denies         [facility] PT Assessment - 01/19/18 0001      AROM   Right Shoulder Flexion  124 Degrees      Strength   Right Hand Grip (lbs)  72.6 70,72,76                   [facility] Adult PT Treatment/Exercise - 01/19/18 0001      Shoulder Exercises: Supine   Horizontal ABduction  10 reps;Theraband    Theraband Level (Shoulder Horizontal ABduction)  Level 1 (Yellow)    Flexion  AROM;10 reps    Other Supine Exercises  supine cane press ups and pullovers 2#       Shoulder Exercises: Standing   External Rotation  10 reps    Theraband Level (Shoulder External Rotation)  Level 1 (Yellow)    Internal Rotation  10 reps;Theraband    Theraband Level (Shoulder Internal Rotation)  Level 1 (Yellow)    Row  15 reps    Theraband Level (Shoulder  Row)  Level 2 (Red)    Other Standing Exercises  bilateral wall slides x 20       Shoulder Exercises: Pulleys   Flexion  3 minutes    Scaption  --      Shoulder Exercises: ROM/Strengthening   Other ROM/Strengthening Exercises  standing cane shoulder extension and IR     Other ROM/Strengthening Exercises  UE ranger standing cane AAROM flexion and abduction, ER       Shoulder Exercises: Isometric Strengthening   Flexion  5X10"    Extension  5X10"    External Rotation  5X10"    Internal Rotation  5X10"             PT Education - 01/19/18 1203    Education provided  Yes    Education Details  HEP udate     Person(s) Educated  Patient    Methods  Explanation;Handout    Comprehension  Verbalized understanding       PT Short Term Goals - 01/12/18 1312      PT SHORT TERM GOAL #1   Title  pt to be I with inital HEP     Baseline  does some of the exercises    Time  3    Period  Weeks    Status  On-going      PT SHORT TERM  GOAL #2   Title  pt to vebralize and demo techniques to reduce inflammation via RICE and HEP     Baseline  education continues on this topic    Time  3    Period  Weeks    Status  On-going      PT SHORT TERM GOAL #3   Title  pt to increase R grip strength by >/= 10# to demo shoulder function improvement    Time  3    Period  Weeks    Status  Unable to assess        PT Long Term Goals - 12/17/17 1252      PT LONG TERM GOAL #1   Title  improve R shoulder flexion/ abduction to >/= 130 degrees and shoulder IR/ER to >/= 50 degrees ( assessed at 90/90) for functional mobility required for ADLs with </= 2/10 pain     Time  6    Period  Weeks    Status  New    Target Date  01/28/18      PT LONG TERM GOAL #2   Title  increase R shoulder strength to >/= 4/5 in all planes to promote shoulder stability with lifting and carrying activtieis     Time  6    Period  Weeks    Status  New    Target Date  01/28/18      PT LONG TERM GOAL #3   Title  pt be able to lift/ lower >/= 8# to and from overhead hself and push/pull >/=10# reporting </=2/10 pain for funtional strength required for ADLs    Time  6    Period  Weeks    Status  New    Target Date  01/28/18      PT LONG TERM GOAL #4   Title  pt to increase FOTO score to </= 38% limited to demo improvement in function     Time  6    Period  Weeks    Status  New    Target Date  01/28/18      PT LONG TERM  GOAL #5   Title  pt to be I with all HEP given as of last visit to maintain and progress currrent level of function     Time  6    Period  Weeks    Status  New    Target Date  01/28/18            Plan - 01/19/18 1203    Clinical Impression Statement  Pt reports increased compliance with HEP and decreased pain. She demonstrates improved AROM and grip strength. Able to progress to yellow band resistance exercsises. She reports no increased pain at end of session. She requested to end early due to her grandson's MD appt.     PT  Next Visit Plan  Continue PROM AAROM avoid supine positioning because of her back    PT Home Exercise Plan  shoulder table slides flexion/ abduction, isometrics flexion/ abduction/ IR/ER, standing yellow band IR, ER and rows     Consulted and Agree with Plan of Care  Patient       Patient will benefit from skilled therapeutic intervention in order to improve the following deficits and impairments:  Pain, Decreased strength, Impaired UE functional use, Increased fascial restricitons, Increased muscle spasms, Postural dysfunction, Improper body mechanics, Decreased endurance, Decreased activity tolerance  Visit Diagnosis: Chronic right shoulder pain  Muscle weakness (generalized)  Stiffness of right shoulder, not elsewhere classified     Problem List Patient Active Problem List   Diagnosis Date Noted  . Complete tear of right rotator cuff 07/22/2017    Class: Chronic  . Retained orthopedic hardware 07/22/2017    Class: Chronic  . Cervical disc herniation 07/21/2017    Class: Acute  . Status post cervical spinal fusion 07/21/2017  . Chronic back pain     Dorene Ar, [place] 01/19/2018, 12:06 PM  [facility] [address] [place], [place], [address] Phone: [phone]   Fax:  [phone]  Name: [name] MRN: [id number] Date of Birth: 07/24/1966

## 2018-01-19 NOTE — Patient Instructions (Signed)
Strengthening: Resisted Internal Rotation   Hold tubing in left hand, elbow at side and forearm out. Rotate forearm in across body. Repeat _10___ times per set. Do __2__ sets per session. Do ___2_ sessions per day.  http://orth.exer.us/830   Copyright  VHI. All rights reserved.  Strengthening: Resisted External Rotation   Hold tubing in right hand, elbow at side and forearm across body. Rotate forearm out. Repeat ___10_ times per set. Do ___2_ sets per session. Do __2__ sessions per day.  http://orth.exer.us/828     Resistive Band Rowing   With resistive band anchored in door, grasp both ends. Keeping elbows bent, pull back, squeezing shoulder blades together. Hold _5___ seconds. Repeat ___15_ times. Do _2___ sessions per day.  http://gt2.exer.us/97   Copyright  VHI. All rights reserved.

## 2018-01-21 ENCOUNTER — Ambulatory Visit: Payer: Medicare Other | Admitting: Physical Therapy

## 2018-01-21 DIAGNOSIS — M25511 Pain in right shoulder: Secondary | ICD-10-CM

## 2018-01-21 DIAGNOSIS — M25611 Stiffness of right shoulder, not elsewhere classified: Secondary | ICD-10-CM

## 2018-01-21 DIAGNOSIS — M6281 Muscle weakness (generalized): Secondary | ICD-10-CM

## 2018-01-21 DIAGNOSIS — G8929 Other chronic pain: Secondary | ICD-10-CM

## 2018-01-21 NOTE — Therapy (Addendum)
[place], [place], [address] Phone: [phone]   Fax:  [phone]  Physical Therapy Treatment Progress Note Reporting Period 12/17/2017 to 01/21/2018  See note below for Objective Data and Assessment of Progress/Goals.      Patient Details  Name: [name] MRN: [id number] Date of Birth: 1966-02-15 Referring Provider: Tonna Corner dean MD   Encounter Date: 01/21/2018  PT End of Session - 01/21/18 1237    Visit Number  10    Number of Visits  18    Date for PT Re-Evaluation  01/28/18    Authorization Type  MCR: Kx mod byt 15th visit, progress note by 10th visit.    PT Start Time  1155    PT Stop Time  1230    PT Time Calculation (min)  35 min    Activity Tolerance  Patient tolerated treatment well    Behavior During Therapy  WFL for tasks assessed/performed       Past Medical History:  Diagnosis Date  . Anxiety   . Arthritis    rheumatoid , osteoarthritis  . Bronchitis   . Chronic back pain   . Depression   . Diabetes mellitus   . Fatty liver   . Fibromyalgia   . GERD (gastroesophageal reflux disease)   . Headache   . Hypertension   . Pancreatitis   . Pneumonia   . Rotator cuff tear, right     Past Surgical History:  Procedure Laterality Date  . ABDOMINAL HYSTERECTOMY    . ANTERIOR CERVICAL DECOMP/DISCECTOMY FUSION N/A 07/21/2017   Procedure: ANTERIOR CERVICAL DISCECTOMY AND FUSION C6-7, REMOVAL OF SCREW C6 PLATE, DEPUY ZERO-P IMPLANT, LOCAL BONE GRAFT, ALLOGRAFT BONE GRAFT, VIVIGEN;  Surgeon: Jessy Oto, MD;  Location: [place];  Service: Orthopedics;  Laterality: N/A;  . BACK SURGERY     laminectomy  . BONE GRAFT HIP ILIAC CREST    . CARPAL TUNNEL RELEASE    . CERVICAL DISCECTOMY  07/21/2017  . CESAREAN SECTION    . CHOLECYSTECTOMY    . COLONOSCOPY    . frontal fusion    . neck fusion     patient reported  . SHOULDER ARTHROSCOPY WITH SUBACROMIAL DECOMPRESSION, ROTATOR  CUFF REPAIR AND BICEP TENDON REPAIR Right 10/31/2017   Procedure: RIGHT SHOULDER ARTHROSCOPY, MANIPULATION UNDER ANESTHESIA, BICEPS TENODESIS, AND MINI OPEN ROTATOR CUFF TEAR REPAIR;  Surgeon: Meredith Pel, MD;  Location: [place];  Service: Orthopedics;  Laterality: Right;    There were no vitals filed for this visit.  Subjective Assessment - 01/21/18 1219    Subjective  Pt reports no pain upon arrival and that she has been performing her exercises at home. She does not want to do any supine exercises today due to her back bothering her.     Currently in Pain?  No/denies              [facility] PT Assessment - 01/21/2018     AROM   Right Shoulder Flexion  135 Degrees    Right Shoulder ABduction  128 Degrees    Right Shoulder Internal Rotation  -- reaches to lateral glute    Right Shoulder External Rotation  -- reaches T2 with pain                 [facility] Adult PT Treatment/Exercise - 01/21/18 1210      Lumbar Exercises: Aerobic   UBE (Upper Arm Bike)  4 min 2 min forwards,  2 min backwards      Shoulder Exercises: Standing   Protraction  AROM;Strengthening;5 reps plus one push ups on counter- activity stopped due to pain    External Rotation  AROM;Strengthening;10 reps x 2 sets. Cues for correct technique    Theraband Level (Shoulder External Rotation)  Level 2 (Red)    Internal Rotation  AROM;Strengthening;10 reps x 2 sets. Cues for correct.     Theraband Level (Shoulder Internal Rotation)  Level 2 (Red)    Row  AROM;Strengthening;Both;10 reps x 2 sets. Cues and demonstration for correct form.     Theraband Level (Shoulder Row)  Level 2 (Red)    Other Standing Exercises  Cane exercises - flex and abd x 10    Other Standing Exercises  Finger ladder flex and abd x 10 with eccentric lowering      Shoulder Exercises: Pulleys   Flexion  2 minutes 2 min x flex, 2 min x scaption             PT Short Term Goals - 01/21/2018     PT SHORT TERM GOAL #1   Title  pt to  be I with inital HEP     Time  3    Period  Weeks    Status  Achieved      PT SHORT TERM GOAL #2   Title  pt to vebralize and demo techniques to reduce inflammation via RICE and HEP     Time  3    Period  Weeks    Status  Achieved      PT SHORT TERM GOAL #3   Title  pt to increase R grip strength by >/= 10# to demo shoulder function improvement    Time  3    Period  Weeks    Status  Achieved      PT Long Term Goals - 12/17/17 1252      PT LONG TERM GOAL #1   Title  improve R shoulder flexion/ abduction to >/= 130 degrees and shoulder IR/ER to >/= 50 degrees ( assessed at 90/90) for functional mobility required for ADLs with </= 2/10 pain     Time  6    Period  Weeks    Status  New    Target Date  01/28/18      PT LONG TERM GOAL #2   Title  increase R shoulder strength to >/= 4/5 in all planes to promote shoulder stability with lifting and carrying activtieis     Time  6    Period  Weeks    Status  New    Target Date  01/28/18      PT LONG TERM GOAL #3   Title  pt be able to lift/ lower >/= 8# to and from overhead hself and push/pull >/=10# reporting </=2/10 pain for funtional strength required for ADLs    Time  6    Period  Weeks    Status  New    Target Date  01/28/18      PT LONG TERM GOAL #4   Title  pt to increase FOTO score to </= 38% limited to demo improvement in function     Time  6    Period  Weeks    Status  New    Target Date  01/28/18      PT LONG TERM GOAL #5   Title  pt to be I with all HEP given as of last visit to  maintain and progress currrent level of function     Time  6    Period  Weeks    Status  New    Target Date  01/28/18         Plan - 01/21/18 1239    Clinical Impression Statement  Pt arrived late today. Reports compliance with HEP. No pain reported upon arrival. Pt demonstrating improved R shoulder flexion and abduction AROM. She also progressed to using the red theraband for stregthening exercises. She would benefit from  continued PT services to address ROM, strengtheing, and functional limitations.     PT Treatment/Interventions  ADLs/Self Care Home Management;Cryotherapy;Electrical Stimulation;Iontophoresis 4mg /ml Dexamethasone;Ultrasound;Moist Heat;Passive range of motion;Dry needling;Taping;Manual techniques;Therapeutic exercise;Therapeutic activities;Patient/family education    PT Next Visit Plan  Continue PROM AAROM avoid supine positioning because of her back, attempt serratus anterior wall push ups    PT Home Exercise Plan  shoulder table slides flexion/ abduction, isometrics flexion/ abduction/ IR/ER, standing yellow band IR, ER and rows     Consulted and Agree with Plan of Care  Patient       Patient will benefit from skilled therapeutic intervention in order to improve the following deficits and impairments:  Pain, Decreased strength, Impaired UE functional use, Increased fascial restricitons, Increased muscle spasms, Postural dysfunction, Improper body mechanics, Decreased endurance, Decreased activity tolerance  Visit Diagnosis: Stiffness of right shoulder, not elsewhere classified  Muscle weakness (generalized)  Chronic right shoulder pain     Problem List Patient Active Problem List   Diagnosis Date Noted  . Complete tear of right rotator cuff 07/22/2017    Class: Chronic  . Retained orthopedic hardware 07/22/2017    Class: Chronic  . Cervical disc herniation 07/21/2017    Class: Acute  . Status post cervical spinal fusion 07/21/2017  . Chronic back pain      Worthy Flank, SPT 01/21/18 12:46 PM    [facility] [address] [place], [place], [address] Phone: [phone]   Fax:  [phone]  Name: [name] MRN: [id number] Date of Birth: Apr 24, 1966

## 2018-01-23 ENCOUNTER — Ambulatory Visit: Payer: Medicare Other | Admitting: Physical Therapy

## 2018-01-26 ENCOUNTER — Encounter: Payer: Self-pay | Admitting: Physical Therapy

## 2018-01-26 ENCOUNTER — Ambulatory Visit: Payer: Medicare Other | Admitting: Physical Therapy

## 2018-01-26 DIAGNOSIS — M25511 Pain in right shoulder: Secondary | ICD-10-CM | POA: Diagnosis not present

## 2018-01-26 DIAGNOSIS — M6281 Muscle weakness (generalized): Secondary | ICD-10-CM

## 2018-01-26 DIAGNOSIS — M25611 Stiffness of right shoulder, not elsewhere classified: Secondary | ICD-10-CM

## 2018-01-26 DIAGNOSIS — G8929 Other chronic pain: Secondary | ICD-10-CM

## 2018-01-26 NOTE — Patient Instructions (Signed)
Cane Exercise: Extension / Internal Rotation   Stand holding cane behind back with both hands palm-up. Slide cane up spine toward head. Hold _5___ seconds. Repeat _10-20___ times. Do __2__ sessions per day.  http://gt2.exer.us/85   Copyright  VHI. All rights reserved.  Kasandra Knudsen Exercise: Extension   Stand holding cane behind back with both hands palm-up. Lift the cane away from body. Hold _5___ seconds. Repeat ___10-20 times. Do __2__ sessions per day.  http://gt2.exer.us/83   Copyright  VHI. All rights reserved.

## 2018-01-26 NOTE — Therapy (Signed)
[place], [place], [address] Phone: [phone]   Fax:  [phone]  Physical Therapy Treatment  Patient Details  Name: ANIQA HARE MRN: [id number] Date of Birth: 1965-10-06 Referring Provider: Tonna Corner dean MD   Encounter Date: 01/26/2018  PT End of Session - 01/26/18 1154    Visit Number  11    Number of Visits  18    Date for PT Re-Evaluation  01/28/18    Authorization Type  MCR: Kx mod byt 15th visit, progress note by 10th visit.    PT Start Time  1145    PT Stop Time  1215    PT Time Calculation (min)  30 min       Past Medical History:  Diagnosis Date  . Anxiety   . Arthritis    rheumatoid , osteoarthritis  . Bronchitis   . Chronic back pain   . Depression   . Diabetes mellitus   . Fatty liver   . Fibromyalgia   . GERD (gastroesophageal reflux disease)   . Headache   . Hypertension   . Pancreatitis   . Pneumonia   . Rotator cuff tear, right     Past Surgical History:  Procedure Laterality Date  . ABDOMINAL HYSTERECTOMY    . ANTERIOR CERVICAL DECOMP/DISCECTOMY FUSION N/A 07/21/2017   Procedure: ANTERIOR CERVICAL DISCECTOMY AND FUSION C6-7, REMOVAL OF SCREW C6 PLATE, DEPUY ZERO-P IMPLANT, LOCAL BONE GRAFT, ALLOGRAFT BONE GRAFT, VIVIGEN;  Surgeon: Jessy Oto, MD;  Location: [facility];  Service: Orthopedics;  Laterality: N/A;  . BACK SURGERY     laminectomy  . BONE GRAFT HIP ILIAC CREST    . CARPAL TUNNEL RELEASE    . CERVICAL DISCECTOMY  07/21/2017  . CESAREAN SECTION    . CHOLECYSTECTOMY    . COLONOSCOPY    . frontal fusion    . neck fusion     patient reported  . SHOULDER ARTHROSCOPY WITH SUBACROMIAL DECOMPRESSION, ROTATOR CUFF REPAIR AND BICEP TENDON REPAIR Right 10/31/2017   Procedure: RIGHT SHOULDER ARTHROSCOPY, MANIPULATION UNDER ANESTHESIA, BICEPS TENODESIS, AND MINI OPEN ROTATOR CUFF TEAR REPAIR;  Surgeon: Meredith Pel, MD;  Location: [facility];  Service:  Orthopedics;  Laterality: Right;    There were no vitals filed for this visit.  Subjective Assessment - 01/26/18 1209    Subjective  I am doing okay. Doing my exercises at home.     Currently in Pain?  Yes    Pain Score  5     Pain Location  Shoulder    Pain Orientation  Right    Pain Descriptors / Indicators  Aching    Aggravating Factors   reaching out to the side, reaching back     Pain Relieving Factors  TENS, meds HEAT          [facility] PT Assessment - 01/26/18 0001      AROM   Right Shoulder Flexion  135 Degrees    Right Shoulder ABduction  128 Degrees    Right Shoulder Internal Rotation  -- reaches to lateral glute    Right Shoulder External Rotation  -- reaches T2 with pain                   [facility] Adult PT Treatment/Exercise - 01/26/18 0001      Lumbar Exercises: Aerobic   UBE (Upper Arm Bike)  4 min 2 min forwards, 2 min backwards      Shoulder Exercises:  Supine   Horizontal ABduction  10 reps    Theraband Level (Shoulder Horizontal ABduction)  Level 2 (Red)      Shoulder Exercises: Standing   External Rotation  AROM;Strengthening;10 reps x 2 sets. Cues for correct technique    Theraband Level (Shoulder External Rotation)  Level 2 (Red)    Internal Rotation  AROM;Strengthening;10 reps x 2 sets. Cues for correct.     Theraband Level (Shoulder Internal Rotation)  Level 2 (Red)    Flexion  10 reps;Weights    Shoulder Flexion Weight (lbs)  1    ABduction  10 reps;Weights    Shoulder ABduction Weight (lbs)  1    Extension  15 reps;Theraband    Theraband Level (Shoulder Extension)  Level 2 (Red)    Row  AROM;Strengthening;Both;10 reps x 2 sets. Cues and demonstration for correct form.     Theraband Level (Shoulder Row)  Level 2 (Red)    Other Standing Exercises  Cane exercises - flex and abd x 10    Other Standing Exercises  wall slides       Shoulder Exercises: Pulleys   Flexion  2 minutes 2 min x flex, 2 min x scaption      Shoulder Exercises:  ROM/Strengthening   Other ROM/Strengthening Exercises  standing cane shoulder extension and IR              PT Education - 01/26/18 1212    Education provided  Yes    Education Details  HEP    Person(s) Educated  Patient    Methods  Explanation;Handout    Comprehension  Verbalized understanding       PT Short Term Goals - 01/26/18 1154      PT SHORT TERM GOAL #1   Title  pt to be I with inital HEP     Time  3    Period  Weeks    Status  Achieved      PT SHORT TERM GOAL #2   Title  pt to vebralize and demo techniques to reduce inflammation via RICE and HEP     Time  3    Period  Weeks    Status  Achieved      PT SHORT TERM GOAL #3   Title  pt to increase R grip strength by >/= 10# to demo shoulder function improvement    Time  3    Period  Weeks    Status  Achieved        PT Long Term Goals - 12/17/17 1252      PT LONG TERM GOAL #1   Title  improve R shoulder flexion/ abduction to >/= 130 degrees and shoulder IR/ER to >/= 50 degrees ( assessed at 90/90) for functional mobility required for ADLs with </= 2/10 pain     Time  6    Period  Weeks    Status  New    Target Date  01/28/18      PT LONG TERM GOAL #2   Title  increase R shoulder strength to >/= 4/5 in all planes to promote shoulder stability with lifting and carrying activtieis     Time  6    Period  Weeks    Status  New    Target Date  01/28/18      PT LONG TERM GOAL #3   Title  pt be able to lift/ lower >/= 8# to and from overhead hself and push/pull >/=10# reporting </=2/10 pain  for funtional strength required for ADLs    Time  6    Period  Weeks    Status  New    Target Date  01/28/18      PT LONG TERM GOAL #4   Title  pt to increase FOTO score to </= 38% limited to demo improvement in function     Time  6    Period  Weeks    Status  New    Target Date  01/28/18      PT LONG TERM GOAL #5   Title  pt to be I with all HEP given as of last visit to maintain and progress currrent level of  function     Time  6    Period  Weeks    Status  New    Target Date  01/28/18            Plan - 01/26/18 1213    Clinical Impression Statement  Pt reports 5/10 pain. Improved flexion, abduction and ER functional reaching, Internal rotation reach still limted to lateral gluteal. Added standing cane IR and extension for HEP. Increased pain to 8/10 with IR exercises. Opted to avoid supine lying due to patient's back pain. ERO next visit. Patient wants to reduce frequency to 1 x per week.     PT Next Visit Plan  ERO/FOTO/ ; Continue PROM AAROM avoid supine positioning because of her back, attempt serratus anterior wall push ups    PT Home Exercise Plan  shoulder table slides flexion/ abduction, isometrics flexion/ abduction/ IR/ER, standing yellow band IR, ER and rows , standing cane IR and extension     Consulted and Agree with Plan of Care  Patient       Patient will benefit from skilled therapeutic intervention in order to improve the following deficits and impairments:  Pain, Decreased strength, Impaired UE functional use, Increased fascial restricitons, Increased muscle spasms, Postural dysfunction, Improper body mechanics, Decreased endurance, Decreased activity tolerance  Visit Diagnosis: Stiffness of right shoulder, not elsewhere classified  Muscle weakness (generalized)  Chronic right shoulder pain     Problem List Patient Active Problem List   Diagnosis Date Noted  . Complete tear of right rotator cuff 07/22/2017    Class: Chronic  . Retained orthopedic hardware 07/22/2017    Class: Chronic  . Cervical disc herniation 07/21/2017    Class: Acute  . Status post cervical spinal fusion 07/21/2017  . Chronic back pain     Dorene Ar, [place] 01/26/2018, 12:56 PM  [facility] [address] [place], [place], [address] Phone: [phone]   Fax:  [phone]  Name: SEDRA MORFIN MRN: [id number] Date of Birth:  08-04-1966

## 2018-01-28 ENCOUNTER — Ambulatory Visit: Payer: Medicare Other | Admitting: Physical Therapy

## 2018-01-30 ENCOUNTER — Ambulatory Visit: Payer: Medicare Other | Admitting: Physical Therapy

## 2018-01-30 DIAGNOSIS — G8929 Other chronic pain: Secondary | ICD-10-CM

## 2018-01-30 DIAGNOSIS — M6281 Muscle weakness (generalized): Secondary | ICD-10-CM

## 2018-01-30 DIAGNOSIS — M25511 Pain in right shoulder: Secondary | ICD-10-CM

## 2018-01-30 DIAGNOSIS — M25611 Stiffness of right shoulder, not elsewhere classified: Secondary | ICD-10-CM

## 2018-01-30 NOTE — Therapy (Addendum)
[place], [place], [address] Phone: [phone]   Fax:  [phone]  Physical Therapy Treatment/ Re-certification  Patient Details  Name: [name] MRN: [id number] Date of Birth: June 05, 1966 Referring Provider: Tonna Corner dean MD   Encounter Date: 01/30/2018  PT End of Session - 01/30/18 1153    Visit Number  12    Number of Visits  20    Date for PT Re-Evaluation  02/27/18    Authorization Type  MCR: Kx mod byt 15th visit, progress note by 20th visit.    PT Start Time  1102    PT Stop Time  1159    PT Time Calculation (min)  57 min    Activity Tolerance  Patient limited by pain    Behavior During Therapy  [facility] for tasks assessed/performed       Past Medical History:  Diagnosis Date  . Anxiety   . Arthritis    rheumatoid , osteoarthritis  . Bronchitis   . Chronic back pain   . Depression   . Diabetes mellitus   . Fatty liver   . Fibromyalgia   . GERD (gastroesophageal reflux disease)   . Headache   . Hypertension   . Pancreatitis   . Pneumonia   . Rotator cuff tear, right     Past Surgical History:  Procedure Laterality Date  . ABDOMINAL HYSTERECTOMY    . ANTERIOR CERVICAL DECOMP/DISCECTOMY FUSION N/A 07/21/2017   Procedure: ANTERIOR CERVICAL DISCECTOMY AND FUSION C6-7, REMOVAL OF SCREW C6 PLATE, DEPUY ZERO-P IMPLANT, LOCAL BONE GRAFT, ALLOGRAFT BONE GRAFT, VIVIGEN;  Surgeon: Jessy Oto, MD;  Location: [place] [facility];  Service: Orthopedics;  Laterality: N/A;  . BACK SURGERY     laminectomy  . BONE GRAFT HIP ILIAC CREST    . CARPAL TUNNEL RELEASE    . CERVICAL DISCECTOMY  07/21/2017  . CESAREAN SECTION    . CHOLECYSTECTOMY    . COLONOSCOPY    . frontal fusion    . neck fusion     patient reported  . SHOULDER ARTHROSCOPY WITH SUBACROMIAL DECOMPRESSION, ROTATOR CUFF REPAIR AND BICEP TENDON REPAIR Right 10/31/2017   Procedure: RIGHT SHOULDER ARTHROSCOPY, MANIPULATION UNDER ANESTHESIA,  BICEPS TENODESIS, AND MINI OPEN ROTATOR CUFF TEAR REPAIR;  Surgeon: Meredith Pel, MD;  Location: [facility];  Service: Orthopedics;  Laterality: Right;    There were no vitals filed for this visit.  Subjective Assessment - 01/30/18 1109    Subjective  I am sore today. I have been doing my exercises, the new ones are hard."    Currently in Pain?  Yes    Pain Score  7     Pain Location  Shoulder    Pain Orientation  Right    Pain Descriptors / Indicators  Aching;Sore    Pain Onset  More than a month ago    Pain Relieving Factors  Rubbing cream         [facility] PT Assessment - 01/30/18 0001      AROM   Right Shoulder Flexion  120 Degrees pain throughout entire motion     Right Shoulder ABduction  70 Degrees pain through entire motion    Right Shoulder Internal Rotation  -- to stomach (measured at neutral due to pain)    Right Shoulder External Rotation  27 Degrees measured at neutral due to pain      Strength   Right Shoulder Flexion  3/5    Right Shoulder  Extension  3+/5    Right Shoulder ABduction  3-/5    Right Shoulder Internal Rotation  4/5    Right Shoulder External Rotation  4-/5    Right Hand Grip (lbs)  63 65, 65, 60                   [facility] Adult PT Treatment/Exercise - 01/30/18 0001      Lumbar Exercises: Aerobic   UBE (Upper Arm Bike)  3 min      Shoulder Exercises: Pulleys   Flexion  2 minutes flexion and scaption      Moist Heat Therapy   Number Minutes Moist Heat  10 Minutes    Moist Heat Location  Shoulder right      Electrical Stimulation   Electrical Stimulation Location  R shoulder    Electrical Stimulation Action  IFC    Electrical Stimulation Parameters  L9 x 10 min 100% scan    Electrical Stimulation Goals  Pain      Manual Therapy   Manual Therapy  -- MTPr upper trap             PT Education - 01/30/18 1152    Education provided  Yes    Education Details  HEP and avoiding hiking shoulder during IR exercise (modified to  towel IR exercise to prevent upper trap activation), POC, reassessment findings    Person(s) Educated  Patient    Methods  Explanation;Handout    Comprehension  Verbalized understanding       PT Short Term Goals - 01/26/18 1154      PT SHORT TERM GOAL #1   Title  pt to be I with inital HEP     Time  3    Period  Weeks    Status  Achieved      PT SHORT TERM GOAL #2   Title  pt to vebralize and demo techniques to reduce inflammation via RICE and HEP     Time  3    Period  Weeks    Status  Achieved      PT SHORT TERM GOAL #3   Title  pt to increase R grip strength by >/= 10# to demo shoulder function improvement    Time  3    Period  Weeks    Status  Achieved        PT Long Term Goals - 01/30/18 1130      PT LONG TERM GOAL #1   Title  improve R shoulder flexion/ abduction to >/= 130 degrees and shoulder IR/ER to >/= 50 degrees ( assessed at 90/90) for functional mobility required for ADLs with </= 2/10 pain     Time  4    Period  Weeks    Status  On-going    Target Date  02/27/18      PT LONG TERM GOAL #2   Title  increase R shoulder strength to >/= 4/5 in all planes to promote shoulder stability with lifting and carrying activtieis     Time  4    Period  Weeks    Status  On-going    Target Date  02/27/18      PT LONG TERM GOAL #3   Title  pt be able to lift/ lower >/= 8# to and from overhead hself and push/pull >/=10# reporting </=2/10 pain for funtional strength required for ADLs    Time  4    Period  Weeks  Status  On-going    Target Date  02/27/18      PT LONG TERM GOAL #4   Title  pt to increase FOTO score to </= 38% limited to demo improvement in function     Time  4    Period  Weeks    Status  On-going    Target Date  02/27/18      PT LONG TERM GOAL #5   Title  pt to be I with all HEP given as of last visit to maintain and progress currrent level of function     Time  4    Period  Weeks    Status  On-going    Target Date  02/27/18             Plan - 01/30/18 1154    Clinical Impression Statement  Pt reporting increased pain upon arrival and that has been struggling with the cane exercise that focuses on IR. She pinpoints pain to R upper trap and over incision area. MTPr attempted over R upper trap as muscle was very tight upon palpation; pt verbalized she did not like this treatment. Reassessment performed revealing continued limitations in R shoulder AROM and strength. However, pt was significantly limited by pain this session. Moist heat pack and ICF e-stim applied at end of session to address pain. Pt reports feeling better after heat and e-stim.     Rehab Potential  Good    PT Frequency  2x / week    PT Duration  4 weeks    PT Treatment/Interventions  ADLs/Self Care Home Management;Cryotherapy;Electrical Stimulation;Iontophoresis 4mg /ml Dexamethasone;Ultrasound;Moist Heat;Passive range of motion;Dry needling;Taping;Manual techniques;Therapeutic exercise;Therapeutic activities;Patient/family education    PT Next Visit Plan  HEP review; Continue PROM AAROM avoid supine positioning because of her back, attempt serratus anterior wall push ups, modalities for pain prn, upper extremity ranger    PT Home Exercise Plan  shoulder table slides flexion/ abduction, isometrics flexion/ abduction/ IR/ER, standing yellow band IR, ER and rows , standing cane ext, shoulder IR with towel    Consulted and Agree with Plan of Care  Patient       Patient will benefit from skilled therapeutic intervention in order to improve the following deficits and impairments:  Pain, Decreased strength, Impaired UE functional use, Increased fascial restricitons, Increased muscle spasms, Postural dysfunction, Improper body mechanics, Decreased endurance, Decreased activity tolerance  Visit Diagnosis: Stiffness of right shoulder, not elsewhere classified  Muscle weakness (generalized)  Chronic right shoulder pain     Problem List Patient Active  Problem List   Diagnosis Date Noted  . Complete tear of right rotator cuff 07/22/2017    Class: Chronic  . Retained orthopedic hardware 07/22/2017    Class: Chronic  . Cervical disc herniation 07/21/2017    Class: Acute  . Status post cervical spinal fusion 07/21/2017  . Chronic back pain    Worthy Flank, SPT 01/30/18 12:59 PM   [facility] [address] [place], [place], [address] Phone: [phone]   Fax:  [phone]  Name: [name] MRN: [id number] Date of Birth: 1966/01/29

## 2018-02-02 ENCOUNTER — Ambulatory Visit: Payer: Medicare Other | Admitting: Physical Therapy

## 2018-02-09 ENCOUNTER — Encounter: Payer: Self-pay | Admitting: Physical Therapy

## 2018-02-09 ENCOUNTER — Ambulatory Visit: Payer: Medicare Other | Attending: Orthopedic Surgery | Admitting: Physical Therapy

## 2018-02-09 DIAGNOSIS — M25611 Stiffness of right shoulder, not elsewhere classified: Secondary | ICD-10-CM

## 2018-02-09 DIAGNOSIS — M6281 Muscle weakness (generalized): Secondary | ICD-10-CM

## 2018-02-09 DIAGNOSIS — M25511 Pain in right shoulder: Secondary | ICD-10-CM | POA: Diagnosis present

## 2018-02-09 DIAGNOSIS — G8929 Other chronic pain: Secondary | ICD-10-CM | POA: Insufficient documentation

## 2018-02-09 NOTE — Therapy (Signed)
[place], [place], [address] Phone: [phone]   Fax:  [phone]  Physical Therapy Treatment  Patient Details  Name: [name] MRN: [id number] Date of Birth: [birth year] Referring Provider: Tonna Corner dean MD   Encounter Date: 02/09/2018  PT End of Session - 02/09/18 1234    Visit Number  13    Number of Visits  20    Date for PT Re-Evaluation  02/27/18    Authorization Type  MCR: Kx mod byt 15th visit, progress note by 20th visit.    PT Start Time  1146    PT Stop Time  1231    PT Time Calculation (min)  45 min    Activity Tolerance  Patient tolerated treatment well    Behavior During Therapy  WFL for tasks assessed/performed       Past Medical History:  Diagnosis Date  . Anxiety   . Arthritis    rheumatoid , osteoarthritis  . Bronchitis   . Chronic back pain   . Depression   . Diabetes mellitus   . Fatty liver   . Fibromyalgia   . GERD (gastroesophageal reflux disease)   . Headache   . Hypertension   . Pancreatitis   . Pneumonia   . Rotator cuff tear, right     Past Surgical History:  Procedure Laterality Date  . ABDOMINAL HYSTERECTOMY    . ANTERIOR CERVICAL DECOMP/DISCECTOMY FUSION N/A 07/21/2017   Procedure: ANTERIOR CERVICAL DISCECTOMY AND FUSION C6-7, REMOVAL OF SCREW C6 PLATE, DEPUY ZERO-P IMPLANT, LOCAL BONE GRAFT, ALLOGRAFT BONE GRAFT, VIVIGEN;  Surgeon: Jessy Oto, MD;  Location: [facility];  Service: Orthopedics;  Laterality: N/A;  . BACK SURGERY     laminectomy  . BONE GRAFT HIP ILIAC CREST    . CARPAL TUNNEL RELEASE    . CERVICAL DISCECTOMY  07/21/2017  . CESAREAN SECTION    . CHOLECYSTECTOMY    . COLONOSCOPY    . frontal fusion    . neck fusion     patient reported  . SHOULDER ARTHROSCOPY WITH SUBACROMIAL DECOMPRESSION, ROTATOR CUFF REPAIR AND BICEP TENDON REPAIR Right 10/31/2017   Procedure: RIGHT SHOULDER ARTHROSCOPY, MANIPULATION UNDER ANESTHESIA, BICEPS  TENODESIS, AND MINI OPEN ROTATOR CUFF TEAR REPAIR;  Surgeon: Meredith Pel, MD;  Location: [facility];  Service: Orthopedics;  Laterality: Right;    There were no vitals filed for this visit.  Subjective Assessment - 02/09/18 1145    Subjective  "I still notice I have some aggrivation with some of the exercises but I do think it is better than the last session"     Currently in Pain?  Yes    Pain Score  5     Pain Location  Shoulder    Pain Orientation  Right    Pain Descriptors / Indicators  Aching;Sore    Pain Type  Surgical pain    Pain Onset  More than a month ago    Pain Frequency  Constant    Aggravating Factors   any activity or general use    Pain Relieving Factors  ice packs, heating pad, pain medication, resting.                        [facility] Adult PT Treatment/Exercise - 02/09/18 1151      Elbow Exercises   Elbow Flexion  Seated bicep curl 2 x 10 3#      Shoulder Exercises: Seated  Other Seated Exercises  Rows 2 x 12 with green theraband      Shoulder Exercises: Standing   Other Standing Exercises  pendulums between exercises for pain relief intermittently      Shoulder Exercises: Pulleys   Flexion  2 minutes cues to work slowly into end ranges    Scaption  2 minutes cues to work slowly into end ranges      Shoulder Exercises: ROM/Strengthening   Other ROM/Strengthening Exercises  UE ranger with IR behind the back, flexion/ scaption and horizontal adductio pt fatigued quickly       Manual Therapy   Manual therapy comments  MTPR along the R upper trap with graduating pressure for pain relief.    Soft tissue mobilization  IASTM along the R upper trap             PT Education - 02/09/18 1233    Education provided  Yes    Education Details  reviewed pt progress and benefits of continued stretching throughout the day versus at only 1 point    Person(s) Educated  Patient    Methods  Explanation;Verbal cues    Comprehension  Verbalized  understanding;Verbal cues required       PT Short Term Goals - 01/26/18 1154      PT SHORT TERM GOAL #1   Title  pt to be I with inital HEP     Time  3    Period  Weeks    Status  Achieved      PT SHORT TERM GOAL #2   Title  pt to vebralize and demo techniques to reduce inflammation via RICE and HEP     Time  3    Period  Weeks    Status  Achieved      PT SHORT TERM GOAL #3   Title  pt to increase R grip strength by >/= 10# to demo shoulder function improvement    Time  3    Period  Weeks    Status  Achieved        PT Long Term Goals - 01/30/18 1130      PT LONG TERM GOAL #1   Title  improve R shoulder flexion/ abduction to >/= 130 degrees and shoulder IR/ER to >/= 50 degrees ( assessed at 90/90) for functional mobility required for ADLs with </= 2/10 pain     Time  4    Period  Weeks    Status  On-going    Target Date  02/27/18      PT LONG TERM GOAL #2   Title  increase R shoulder strength to >/= 4/5 in all planes to promote shoulder stability with lifting and carrying activtieis     Time  4    Period  Weeks    Status  On-going    Target Date  02/27/18      PT LONG TERM GOAL #3   Title  pt be able to lift/ lower >/= 8# to and from overhead hself and push/pull >/=10# reporting </=2/10 pain for funtional strength required for ADLs    Time  4    Period  Weeks    Status  On-going    Target Date  02/27/18      PT LONG TERM GOAL #4   Title  pt to increase FOTO score to </= 38% limited to demo improvement in function     Time  4    Period  Weeks  Status  On-going    Target Date  02/27/18      PT LONG TERM GOAL #5   Title  pt to be I with all HEP given as of last visit to maintain and progress currrent level of function     Time  4    Period  Weeks    Status  On-going    Target Date  02/27/18            Plan - 02/09/18 1234    Clinical Impression Statement  pt continues to report pain at 5/10 which is an improvement compared to last session 7-8/10.  Continued upper trap spasm/ activation, spent time with soft tissue work to calm down activation. cotninued AAROM using UE ranger and shoulder strengthening. end of session she reported 4-5/10 pain and declined modalities.     PT Treatment/Interventions  ADLs/Self Care Home Management;Cryotherapy;Electrical Stimulation;Iontophoresis 4mg /ml Dexamethasone;Ultrasound;Moist Heat;Passive range of motion;Dry needling;Taping;Manual techniques;Therapeutic exercise;Therapeutic activities;Patient/family education    PT Next Visit Plan  HEP review; Continue PROM AAROM avoid supine positioning because of her back, attempt serratus anterior wall push ups, modalities for pain prn, upper extremity ranger    PT Home Exercise Plan  shoulder table slides flexion/ abduction, isometrics flexion/ abduction/ IR/ER, standing yellow band IR, ER and rows , standing cane ext, shoulder IR with towel    Consulted and Agree with Plan of Care  Patient       Patient will benefit from skilled therapeutic intervention in order to improve the following deficits and impairments:  Pain, Decreased strength, Impaired UE functional use, Increased fascial restricitons, Increased muscle spasms, Postural dysfunction, Improper body mechanics, Decreased endurance, Decreased activity tolerance  Visit Diagnosis: Stiffness of right shoulder, not elsewhere classified  Muscle weakness (generalized)  Chronic right shoulder pain     Problem List Patient Active Problem List   Diagnosis Date Noted  . Complete tear of right rotator cuff 07/22/2017    Class: Chronic  . Retained orthopedic hardware 07/22/2017    Class: Chronic  . Cervical disc herniation 07/21/2017    Class: Acute  . Status post cervical spinal fusion 07/21/2017  . Chronic back pain    Starr Lake PT, DPT, LAT, ATC  02/09/18  12:38 PM      [facility] [address] [place], [place], [address] Phone:  [phone]   Fax:  [phone]  Name: [name] MRN: [id number] Date of Birth: 08-Mar-1966

## 2018-02-16 ENCOUNTER — Ambulatory Visit: Payer: Medicare Other | Admitting: Physical Therapy

## 2018-02-16 ENCOUNTER — Encounter: Payer: Self-pay | Admitting: Physical Therapy

## 2018-02-16 ENCOUNTER — Encounter (INDEPENDENT_AMBULATORY_CARE_PROVIDER_SITE_OTHER): Payer: Self-pay | Admitting: Orthopedic Surgery

## 2018-02-16 ENCOUNTER — Ambulatory Visit (INDEPENDENT_AMBULATORY_CARE_PROVIDER_SITE_OTHER): Payer: Medicare Other | Admitting: Orthopedic Surgery

## 2018-02-16 ENCOUNTER — Telehealth (INDEPENDENT_AMBULATORY_CARE_PROVIDER_SITE_OTHER): Payer: Self-pay | Admitting: Specialist

## 2018-02-16 DIAGNOSIS — Z9889 Other specified postprocedural states: Secondary | ICD-10-CM

## 2018-02-16 DIAGNOSIS — M25511 Pain in right shoulder: Secondary | ICD-10-CM

## 2018-02-16 DIAGNOSIS — G8929 Other chronic pain: Secondary | ICD-10-CM

## 2018-02-16 DIAGNOSIS — M25611 Stiffness of right shoulder, not elsewhere classified: Secondary | ICD-10-CM

## 2018-02-16 DIAGNOSIS — M6281 Muscle weakness (generalized): Secondary | ICD-10-CM

## 2018-02-16 NOTE — Progress Notes (Signed)
Office Visit Note   [name]           Date of Birth: 1966/05/08           MRN: [id number] Visit Date: 02/16/2018 Requested by: Nolene Ebbs, MD [address] [place], [place] [address] PCP: Nolene Ebbs, MD  Subjective: Chief Complaint  Patient presents with  . Right Shoulder - Routine Post Op    HPI: [name] is a patient who is now about 3 and half months out right shoulder arthroscopy manipulation biceps tenodesis and cuff repair.  She is been doing well.  She is doing physical therapy and home exercise program.  She is having some problems with her neck.              ROS: All systems reviewed are negative as they relate to the chief complaint within the history of present illness.  Patient denies  fevers or chills.   Assessment & Plan: Visit Diagnoses:  1. S/P rotator cuff repair     Plan: Impression is significant improvement in strength and range of motion of the right shoulder.  She is now over 90 degrees of abduction and forward flexion which is good.  I like her to really keep the gas on the pedal in terms of doing therapy 3 times a week and home exercise program daily.  See her back in about 4 months for final check.  She is going to have 1 of our surgeons look at her neck.  Follow-Up Instructions: Return in about 4 months (around 06/19/2018).   Orders:  No orders of the defined types were placed in this encounter.  No orders of the defined types were placed in this encounter.     Procedures: No procedures performed   Clinical Data: No additional findings.  Objective: Vital Signs: There were no vitals taken for this visit.  Physical Exam:   Constitutional: Patient appears well-developed HEENT:  Head: Normocephalic Eyes:EOM are normal Neck: Normal range of motion Cardiovascular: Normal rate Pulmonary/chest: Effort normal Neurologic: Patient is alert Skin: Skin is warm Psychiatric: Patient has normal mood and affect    Ortho Exam:  Ortho exam demonstrates good cervical spine range of motion with palpable radial pulses bilaterally.  She has improved passive range of motion of the shoulder to symmetric external rotation at 60 degrees bilaterally with forward flexion abduction both above 90 degrees on the right-hand side passively.  Rotator cuff strength is also improving to infraspinatus and supraspinatus resistive strength testing.  Specialty Comments:  No specialty comments available.  Imaging: No results found.   PMFS History: Patient Active Problem List   Diagnosis Date Noted  . Complete tear of right rotator cuff 07/22/2017    Class: Chronic  . Retained orthopedic hardware 07/22/2017    Class: Chronic  . Cervical disc herniation 07/21/2017    Class: Acute  . Status post cervical spinal fusion 07/21/2017  . Chronic back pain    Past Medical History:  Diagnosis Date  . Anxiety   . Arthritis    rheumatoid , osteoarthritis  . Bronchitis   . Chronic back pain   . Depression   . Diabetes mellitus   . Fatty liver   . Fibromyalgia   . GERD (gastroesophageal reflux disease)   . Headache   . Hypertension   . Pancreatitis   . Pneumonia   . Rotator cuff tear, right     Family History  Problem Relation Age of Onset  . Hypertension Father   .  Renal Disease Father   . Diabetes Mother   . Hypertension Mother   . Edema Mother   . Diabetes Sister   . Diabetes Brother     Past Surgical History:  Procedure Laterality Date  . ABDOMINAL HYSTERECTOMY    . ANTERIOR CERVICAL DECOMP/DISCECTOMY FUSION N/A 07/21/2017   Procedure: ANTERIOR CERVICAL DISCECTOMY AND FUSION C6-7, REMOVAL OF SCREW C6 PLATE, DEPUY ZERO-P IMPLANT, LOCAL BONE GRAFT, ALLOGRAFT BONE GRAFT, VIVIGEN;  Surgeon: Jessy Oto, MD;  Location: [facility];  Service: Orthopedics;  Laterality: N/A;  . BACK SURGERY     laminectomy  . BONE GRAFT HIP ILIAC CREST    . CARPAL TUNNEL RELEASE    . CERVICAL DISCECTOMY  07/21/2017  . CESAREAN SECTION    .  CHOLECYSTECTOMY    . COLONOSCOPY    . frontal fusion    . neck fusion     patient reported  . SHOULDER ARTHROSCOPY WITH SUBACROMIAL DECOMPRESSION, ROTATOR CUFF REPAIR AND BICEP TENDON REPAIR Right 10/31/2017   Procedure: RIGHT SHOULDER ARTHROSCOPY, MANIPULATION UNDER ANESTHESIA, BICEPS TENODESIS, AND MINI OPEN ROTATOR CUFF TEAR REPAIR;  Surgeon: Meredith Pel, MD;  Location: [facility];  Service: Orthopedics;  Laterality: Right;   Social History   Occupational History  . Not on file  Tobacco Use  . Smoking status: Current Every Day Smoker    Packs/day: 0.50    Types: Cigarettes  . Smokeless tobacco: Never Used  Substance and Sexual Activity  . Alcohol use: No  . Drug use: Yes    Types: Marijuana  . Sexual activity: Not on file

## 2018-02-16 NOTE — Therapy (Addendum)
[place], [place], [address] Phone: [phone]   Fax:  [phone]  Physical Therapy Treatment / Discharge Summary  Patient Details  Name: [name] MRN: [id number] Date of Birth: 04-Feb-1966 Referring Provider: Tonna Corner dean MD   Encounter Date: 02/16/2018  PT End of Session - 02/16/18 1313    Visit Number  14    Number of Visits  20    Date for PT Re-Evaluation  02/27/18    PT Start Time  1147    PT Stop Time  1227    PT Time Calculation (min)  40 min    Activity Tolerance  Patient tolerated treatment well;Patient limited by pain       Past Medical History:  Diagnosis Date  . Anxiety   . Arthritis    rheumatoid , osteoarthritis  . Bronchitis   . Chronic back pain   . Depression   . Diabetes mellitus   . Fatty liver   . Fibromyalgia   . GERD (gastroesophageal reflux disease)   . Headache   . Hypertension   . Pancreatitis   . Pneumonia   . Rotator cuff tear, right     Past Surgical History:  Procedure Laterality Date  . ABDOMINAL HYSTERECTOMY    . ANTERIOR CERVICAL DECOMP/DISCECTOMY FUSION N/A 07/21/2017   Procedure: ANTERIOR CERVICAL DISCECTOMY AND FUSION C6-7, REMOVAL OF SCREW C6 PLATE, DEPUY ZERO-P IMPLANT, LOCAL BONE GRAFT, ALLOGRAFT BONE GRAFT, VIVIGEN;  Surgeon: Jessy Oto, MD;  Location: [facility];  Service: Orthopedics;  Laterality: N/A;  . BACK SURGERY     laminectomy  . BONE GRAFT HIP ILIAC CREST    . CARPAL TUNNEL RELEASE    . CERVICAL DISCECTOMY  07/21/2017  . CESAREAN SECTION    . CHOLECYSTECTOMY    . COLONOSCOPY    . frontal fusion    . neck fusion     patient reported  . SHOULDER ARTHROSCOPY WITH SUBACROMIAL DECOMPRESSION, ROTATOR CUFF REPAIR AND BICEP TENDON REPAIR Right 10/31/2017   Procedure: RIGHT SHOULDER ARTHROSCOPY, MANIPULATION UNDER ANESTHESIA, BICEPS TENODESIS, AND MINI OPEN ROTATOR CUFF TEAR REPAIR;  Surgeon: Meredith Pel, MD;  Location: [facility];  Service: Orthopedics;  Laterality: Right;    There were no vitals filed for this visit.  Subjective Assessment - 02/16/18 1159    Subjective  No pain at rest.  shoulders are tight.  I have a lot going on in my life (Stress)    Patient is accompained by:  Family member Grandson    Currently in Pain?  Yes    Pain Score  5     Pain Location  Shoulder    Pain Orientation  Right    Pain Descriptors / Indicators  Tightness;Aching;Sore    Pain Type  Surgical pain    Pain Radiating Towards  across collar bone    Pain Frequency  Intermittent    Aggravating Factors   activity,  laying down,      Pain Relieving Factors  pillows,   heating pad,  pain medications, resting    Effect of Pain on Daily Activities  limits sleeping,  comfort with ADL's    Multiple Pain Sites  -- back and neck pain long standing         [facility] PT Assessment - 02/16/18 0001      PROM   Right Shoulder Flexion  120 Degrees wall slide,  painful end range    Right Shoulder External Rotation  45 Degrees cane used,  ROM estimated                   [facility] Adult PT Treatment/Exercise - 02/16/18 0001      Posture/Postural Control   Posture Comments  rolled  towel along spine in chair to decrease upper trap and neck /  shoulder pain.  able to reduce tension in upper trap.      Self-Care   Other Self-Care Comments   sitting postrue with rolled towel along spinne decreased tension in upper traps,  relieved pain      Shoulder Exercises: Seated   Row  10 reps red band  cues,  2 sets    External Rotation  10 reps cane    Flexion  -- cane  did not tolerate  too painful to do      Shoulder Exercises: Standing   Flexion  --    Other Standing Exercises  body blade rhthmic stabilization 30 degrees abduction 10 seconds X 2,  IR/ER  at 30 degrees abduction 10 seconds X 2    Other Standing Exercises  wall slides 7X      Shoulder Exercises: Pulleys   Flexion  2 minutes    Flexion Limitations  painful end range     Scaption  2 minutes      Shoulder Exercises: ROM/Strengthening   Wall Pushups  10 reps cues to involve scapula    Other ROM/Strengthening Exercises  UE ranger      Manual Therapy   Manual therapy comments  soft tissue light  upper quadrant,  light tolerated,  teres softened, stretch to Pecs    gentle elbow flex/  extension,  supination/ pronation    Joint Mobilization  self mobs to increase IR with rolled towel and distraction adduction             PT Education - 02/16/18 1300    Education provided  Yes    Education Details  Posture ed,  stretching education    Person(s) Educated  Patient    Methods  Explanation;Demonstration;Verbal cues    Comprehension  Verbalized understanding       PT Short Term Goals - 01/26/18 1154      PT SHORT TERM GOAL #1   Title  pt to be I with inital HEP     Time  3    Period  Weeks    Status  Achieved      PT SHORT TERM GOAL #2   Title  pt to vebralize and demo techniques to reduce inflammation via RICE and HEP     Time  3    Period  Weeks    Status  Achieved      PT SHORT TERM GOAL #3   Title  pt to increase R grip strength by >/= 10# to demo shoulder function improvement    Time  3    Period  Weeks    Status  Achieved        PT Long Term Goals - 02/16/18 1320      PT LONG TERM GOAL #1   Title  improve R shoulder flexion/ abduction to >/= 130 degrees and shoulder IR/ER to >/= 50 degrees ( assessed at 90/90) for functional mobility required for ADLs with </= 2/10 pain     Baseline  needs AAROM for now    Time  4    Period  Weeks    Status  On-going  PT LONG TERM GOAL #2   Time  4    Period  Weeks    Status  Unable to assess      PT LONG TERM GOAL #3   Title  pt be able to lift/ lower >/= 8# to and from overhead hself and push/pull >/=10# reporting </=2/10 pain for funtional strength required for ADLs    Baseline  unable,  AROM continues to be difficult    Time  4    Period  Weeks    Status  On-going      PT  LONG TERM GOAL #4   Title  pt to increase FOTO score to </= 38% limited to demo improvement in function     Time  4    Period  Weeks    Status  Unable to assess      PT LONG TERM GOAL #5   Title  pt to be I with all HEP given as of last visit to maintain and progress currrent level of function     Baseline  independent with exercises added so far    Time  4    Period  Weeks    Status  On-going            Plan - 02/16/18 1314    Clinical Impression Statement  Patient is 15 weeks post op.  She is limited in AROM and ADL's She is doing the exercises issued and nothing else with shoulder.  AAROM flexion to 120 with pain the limiting factor today.  She cannot exercise supine due to other issues. She declined the need for modalities for pain.    PT Next Visit Plan  See what the MD said.  Strengthen as able,  self mobs    PT Home Exercise Plan  shoulder table slides flexion/ abduction, isometrics flexion/ abduction/ IR/ER, standing yellow band IR, ER and rows , standing cane ext, shoulder IR with towel    Consulted and Agree with Plan of Care  Patient       Patient will benefit from skilled therapeutic intervention in order to improve the following deficits and impairments:     Visit Diagnosis: Stiffness of right shoulder, not elsewhere classified  Muscle weakness (generalized)  Chronic right shoulder pain     Problem List Patient Active Problem List   Diagnosis Date Noted  . Complete tear of right rotator cuff 07/22/2017    Class: Chronic  . Retained orthopedic hardware 07/22/2017    Class: Chronic  . Cervical disc herniation 07/21/2017    Class: Acute  . Status post cervical spinal fusion 07/21/2017  . Chronic back pain     Tarvis Blossom PTA 02/16/2018, 1:25 PM  [place], [place], [address] Phone: [phone]   Fax:  [phone]  Name: MAYLANI EMBREE MRN: [id number] Date of Birth:  Dec 15, 1965         PHYSICAL THERAPY DISCHARGE SUMMARY  Visits from Start of Care: 14  Current functional level related to goals / functional outcomes: See goals   Remaining deficits: unknown   Education / Equipment: HEP  Plan: Patient agrees to discharge.  Patient goals were not met. Patient is being discharged due to not returning since the last visit.  ?????         Kristoffer Leamon PT, DPT, LAT, ATC  03/26/18  8:42 AM

## 2018-02-16 NOTE — Telephone Encounter (Signed)
Patient would like to be put on cancellation list if anything comes open before 7/27. CB #  [phone]

## 2018-02-16 NOTE — Telephone Encounter (Signed)
Can you call her back and see if she can come in tomorrow?

## 2018-02-16 NOTE — Telephone Encounter (Signed)
shes scheduled at 10:15

## 2018-02-17 ENCOUNTER — Encounter (INDEPENDENT_AMBULATORY_CARE_PROVIDER_SITE_OTHER): Payer: Self-pay | Admitting: Specialist

## 2018-02-17 ENCOUNTER — Ambulatory Visit (INDEPENDENT_AMBULATORY_CARE_PROVIDER_SITE_OTHER): Payer: Medicare Other

## 2018-02-17 ENCOUNTER — Ambulatory Visit (INDEPENDENT_AMBULATORY_CARE_PROVIDER_SITE_OTHER): Payer: Medicare Other | Admitting: Specialist

## 2018-02-17 VITALS — BP 117/73 | HR 100 | Ht 64.0 in | Wt 200.0 lb

## 2018-02-17 DIAGNOSIS — G8929 Other chronic pain: Secondary | ICD-10-CM | POA: Diagnosis not present

## 2018-02-17 DIAGNOSIS — G5691 Unspecified mononeuropathy of right upper limb: Secondary | ICD-10-CM

## 2018-02-17 DIAGNOSIS — M25511 Pain in right shoulder: Secondary | ICD-10-CM | POA: Diagnosis not present

## 2018-02-17 DIAGNOSIS — Z9889 Other specified postprocedural states: Secondary | ICD-10-CM

## 2018-02-17 DIAGNOSIS — M4322 Fusion of spine, cervical region: Secondary | ICD-10-CM | POA: Diagnosis not present

## 2018-02-17 DIAGNOSIS — G5692 Unspecified mononeuropathy of left upper limb: Secondary | ICD-10-CM | POA: Diagnosis not present

## 2018-02-17 MED ORDER — LIDOCAINE HCL 1 % IJ SOLN
1.0000 mL | INTRAMUSCULAR | Status: AC | PRN
  Administered 2018-02-17: 1 mL

## 2018-02-17 MED ORDER — BUPIVACAINE HCL 0.25 % IJ SOLN
4.0000 mL | INTRAMUSCULAR | Status: AC | PRN
  Administered 2018-02-17: 4 mL via INTRA_ARTICULAR

## 2018-02-17 NOTE — Progress Notes (Signed)
Office Visit Note   [name]           Date of Birth: 05-04-1966           MRN: [id number] Visit Date: 02/17/2018              Requested by: Nolene Ebbs, MD [address] [place], [place] [address] PCP: Nolene Ebbs, MD   Assessment & Plan: Visit Diagnoses:  1. Fusion of spine, cervical region   2. Upper extremity neuropathy, left   3. Upper extremity neuropathy, right   4. Chronic right shoulder pain   5. S/P rotator cuff repair     Plan: Had long discussion with patient today.  I advised that I think that the right sided neck, scapular pain is more centered around her shoulder and not her neck.  I recommended doing a diagnostic shoulder injection.  I did speak with Dr. Marlou Sa to make sure that this was okay since she is only a little over 3 months out from rotator cuff repair.  He recommended that I not use Depo-Medrol in the injection.  After patient consent right shoulder was prepped with Betadine and diagnostic subacromial Marcaine injection was performed from a posterolateral approach.  After sitting for a few minutes patient reported near complete relief of her shoulder pain, right trapezius pain and scapular pain with Marcaine in place.  Patient also had much more fluid, painless shoulder range of motion.  She still did have some limitation with internal rotation behind her back likely due to scar tissue.  We will plan to get NCV/EMG studies bilateral upper extremities to rule out cervical radiculopathy, bilateral cubital/carpal tunnel syndrome.  Follow-up in 4 weeks for recheck with Dr. Louanne Skye to review those studies.  Next office visit we will also reevaluate patient shoulder.  If she still continues to have ongoing pain I will see if it is okay with Dr. Marlou Sa to perform subacromial Marcaine/Depo-Medrol injection at that time.  We may consider repeating cervical MRI depending on the results of the nerve conduction test.  All questions answered.  Follow-Up  Instructions: Return in about 1 month (around 03/17/2018) for With Dr. Louanne Skye to review NCV/EMG study bilateral upper extremities.   Orders:  Orders Placed This Encounter  Procedures  . XR Cervical Spine 2 or 3 views  . Ambulatory referral to Physical Medicine Rehab   No orders of the defined types were placed in this encounter.     Procedures: Large Joint Inj on 02/17/2018 12:05 PM Indications: pain and diagnostic evaluation Details: 25 G 1.5 in needle, posterior approach Medications: 1 mL lidocaine 1 %; 4 mL bupivacaine 0.25 % Outcome: tolerated well, no immediate complications  After sitting for a few minutes patient reported near complete relief of her shoulder pain with Marcaine in place.  Patient also had much more fluid range of motion with flexion/abduction. Consent was given by the patient. Patient was prepped and draped in the usual sterile fashion.       Clinical Data: No additional findings.   Subjective: Chief Complaint  Patient presents with  . Neck - Pain    C/o pain across shoulders, can't sleep, depends on she holds head she will get pain into her upper back, pain, sometimes, into right shoulder and into hand, @ PT-therapist put pressure behind her & relieved pain with the pressure    HPI 52 year old black female comes in with complaints of right-sided neck pain, right shoulder pain, right scapular pain and right  greater than left upper extremity pain numbness tingling.  Patient is status post C6-7 ACDF with 0P implant and removal of C6 plate screw July 21, 2017 by Dr. Louanne Skye.  I had seen patient when she was 6 weeks postop and advised her to continue soft collar and follow-up in 6 weeks but we have not seen her back until today.  During that time.  She has undergone right shoulder scope with debridement and open rotator cuff repair by Dr. Alphonzo Severance that was performed October 31, 2017.  Patient states that she continues to have ongoing pain in her shoulder with  overhead reaching.  Right shoulder pain extends up into her right trapezius and scapular area.  Right-sided pain is not constant and only aggravated with shoulder movement.  In the right arm she has pain at the medial elbow that radiates down to her hand.  Also gets numbness and tingling occasionally in this area.  States that she has had right carpal tunnel release several years ago.  She does complain of left medial elbow pain that occasionally extends of the ulnar aspect of her upper arm.  States that she did have a nerve conduction test several years ago and was advised that she does have left carpal tunnel syndrome but did not have surgery there.  I do not have copy of that report.  Dr. Marlou Sa released her from her shoulder yesterday but states that she does continue shoulder physical therapy. Review of Systems No current cardiac pulmonary GI GU issues  Objective: Vital Signs: BP 117/73 (BP Location: Left Arm, Patient Position: Sitting)   Pulse 100   Ht 5\' 4"  (1.626 m)   Wt 200 lb (90.7 kg)   BMI 34.33 kg/m   Physical Exam  Constitutional: She is oriented to person, place, and time. She appears well-nourished. No distress.  HENT:  Head: Normocephalic and atraumatic.  Eyes: Pupils are equal, round, and reactive to light. EOM are normal.  Neck:  She does have some slight limitation in cervical spine range of motion due to previous two-level fusion and neck stiffness.  Positive bilateral brachial plexus and trapezius tenderness.  More tender on the right side.    Pulmonary/Chest: No respiratory distress.  Musculoskeletal:  Right shoulder pain with impingement testing.  Pain and weakness with supraspinatus resistance.  Negative drop arm test.  Tender over the proximal biceps.  Right shoulder passive flexion/abduction to about 140-150 degrees with pain.  Limited internal rotation behind her back with her right hand to about her lateral hip.  Left shoulder mildly positive impingement test.   Negative drop arm test.  Bilateral elbows good range of motion.  Positive Tinel's bilateral cubital tunnels.  Positive elbow flexion test on the right.  Negative on the left side.  Bilateral wrist positive Tinel's.  Neurological: She is alert and oriented to person, place, and time.  Skin: Skin is warm and dry.    Ortho Exam  Specialty Comments:  No specialty comments available.  Imaging: No results found.   PMFS History: Patient Active Problem List   Diagnosis Date Noted  . Complete tear of right rotator cuff 07/22/2017    Class: Chronic  . Retained orthopedic hardware 07/22/2017    Class: Chronic  . Cervical disc herniation 07/21/2017    Class: Acute  . Status post cervical spinal fusion 07/21/2017  . Chronic back pain    Past Medical History:  Diagnosis Date  . Anxiety   . Arthritis    rheumatoid , osteoarthritis  .  Bronchitis   . Chronic back pain   . Depression   . Diabetes mellitus   . Fatty liver   . Fibromyalgia   . GERD (gastroesophageal reflux disease)   . Headache   . Hypertension   . Pancreatitis   . Pneumonia   . Rotator cuff tear, right     Family History  Problem Relation Age of Onset  . Hypertension Father   . Renal Disease Father   . Diabetes Mother   . Hypertension Mother   . Edema Mother   . Diabetes Sister   . Diabetes Brother     Past Surgical History:  Procedure Laterality Date  . ABDOMINAL HYSTERECTOMY    . ANTERIOR CERVICAL DECOMP/DISCECTOMY FUSION N/A 07/21/2017   Procedure: ANTERIOR CERVICAL DISCECTOMY AND FUSION C6-7, REMOVAL OF SCREW C6 PLATE, DEPUY ZERO-P IMPLANT, LOCAL BONE GRAFT, ALLOGRAFT BONE GRAFT, VIVIGEN;  Surgeon: Jessy Oto, MD;  Location: [facility];  Service: Orthopedics;  Laterality: N/A;  . BACK SURGERY     laminectomy  . BONE GRAFT HIP ILIAC CREST    . CARPAL TUNNEL RELEASE    . CERVICAL DISCECTOMY  07/21/2017  . CESAREAN SECTION    . CHOLECYSTECTOMY    . COLONOSCOPY    . frontal fusion    . neck fusion      patient reported  . SHOULDER ARTHROSCOPY WITH SUBACROMIAL DECOMPRESSION, ROTATOR CUFF REPAIR AND BICEP TENDON REPAIR Right 10/31/2017   Procedure: RIGHT SHOULDER ARTHROSCOPY, MANIPULATION UNDER ANESTHESIA, BICEPS TENODESIS, AND MINI OPEN ROTATOR CUFF TEAR REPAIR;  Surgeon: Meredith Pel, MD;  Location: [facility];  Service: Orthopedics;  Laterality: Right;   Social History   Occupational History  . Not on file  Tobacco Use  . Smoking status: Current Every Day Smoker    Packs/day: 0.50    Types: Cigarettes  . Smokeless tobacco: Never Used  Substance and Sexual Activity  . Alcohol use: No  . Drug use: Yes    Types: Marijuana  . Sexual activity: Not on file

## 2018-02-23 ENCOUNTER — Ambulatory Visit: Payer: Medicare Other | Admitting: Physical Therapy

## 2018-03-11 ENCOUNTER — Encounter (INDEPENDENT_AMBULATORY_CARE_PROVIDER_SITE_OTHER): Payer: Self-pay | Admitting: Physical Medicine and Rehabilitation

## 2018-03-30 ENCOUNTER — Ambulatory Visit (INDEPENDENT_AMBULATORY_CARE_PROVIDER_SITE_OTHER): Payer: Medicare Other | Admitting: Specialist

## 2018-03-30 ENCOUNTER — Encounter (INDEPENDENT_AMBULATORY_CARE_PROVIDER_SITE_OTHER): Payer: Self-pay | Admitting: Specialist

## 2018-03-30 VITALS — BP 138/83 | HR 95 | Ht 64.0 in | Wt 200.0 lb

## 2018-03-30 DIAGNOSIS — M4322 Fusion of spine, cervical region: Secondary | ICD-10-CM | POA: Diagnosis not present

## 2018-03-30 DIAGNOSIS — M19019 Primary osteoarthritis, unspecified shoulder: Secondary | ICD-10-CM

## 2018-03-30 DIAGNOSIS — M5412 Radiculopathy, cervical region: Secondary | ICD-10-CM | POA: Diagnosis not present

## 2018-03-30 DIAGNOSIS — G5621 Lesion of ulnar nerve, right upper limb: Secondary | ICD-10-CM

## 2018-03-30 DIAGNOSIS — M542 Cervicalgia: Secondary | ICD-10-CM

## 2018-03-30 DIAGNOSIS — M25521 Pain in right elbow: Secondary | ICD-10-CM

## 2018-03-30 MED ORDER — HYDROCODONE-ACETAMINOPHEN 5-325 MG PO TABS: 1.0000 | ORAL_TABLET | ORAL | 0 refills | Status: AC | PRN

## 2018-03-30 MED ORDER — DIAZEPAM 5 MG PO TABS: ORAL_TABLET | ORAL | 0 refills | Status: DC

## 2018-03-30 MED ORDER — AMITRIPTYLINE HCL 10 MG PO TABS: 10.0000 mg | ORAL_TABLET | Freq: Every day | ORAL | 3 refills | Status: DC

## 2018-03-30 NOTE — Patient Instructions (Addendum)
  Plan: Avoid overhead lifting and overhead use of the arms. Do not lift greater than 5 lbs. Adjust head rest in vehicle to prevent hyperextension if rear ended. Take extra precautions to avoid falling. You have sensitive right ulnar nerve(funny bone at the inner right elbow. You have a painful right A-C joint to touch. You have weakness above the right elbow and below the elbow at the right hand suggesting that If there is nerve pinch it may be at the neck or both right neck and right elbow. Since you are not able to tolerate needle examination of the muscles an MRI of the right neck and an MRI of the right elbow should help [place] to see if there is pinch of the right ulnar nerve at the elbow or changes in the  Neck on the right side at C7-T1 that would explain nerve pain into the right arm.  Amitryptyline 10 mg at night for nerve pain. Renew hydrocodone for pain. Soft collar to use only at night.

## 2018-03-30 NOTE — Progress Notes (Signed)
Office Visit Note   [name]           Date of Birth: 18-Sep-1965           MRN: [id number] Visit Date: 03/30/2018              Requested by: Nolene Ebbs, MD [address] [place],  [address] PCP: Nolene Ebbs, MD   Assessment & Plan: Visit Diagnoses:  1. Right cervical radiculopathy   2. Cubital tunnel syndrome, right   3. Fusion of spine of cervical region   4. Osteoarthritis of acromioclavicular joint     Plan: Avoid overhead lifting and overhead use of the arms. Do not lift greater than 5 lbs. Adjust head rest in vehicle to prevent hyperextension if rear ended. Take extra precautions to avoid falling. You have sensitive right ulnar nerve(funny bone at the inner right elbow. You have a painful right A-C joint to touch. You have weakness above the right elbow and below the elbow at the right hand suggesting that If there is nerve pinch it may be at the neck or both right neck and right elbow. Since you are not able to tolerate needle examination of the muscles an MRI of the right neck and an MRI of the right elbow should help [place] to see if there is pinch of the right ulnar nerve at the elbow or changes in the  Neck on the right side at C7-T1 that would explain nerve pain into the right arm.  Amitryptyline 10 mg at night for nerve pain. Renew hydrocodone for pain. Soft collar to use only at night. Follow-Up Instructions: Return in about 3 weeks (around 04/20/2018).   Orders:  No orders of the defined types were placed in this encounter.  No orders of the defined types were placed in this encounter.     Procedures: No procedures performed   Clinical Data: No additional findings.   Subjective: Chief Complaint  Patient presents with  . Neck - Follow-up    Patient states that she did NOT get NCS/EMG done due to what she went through after her injection, that she just couldn't do it.     HPI  Review of Systems  Constitutional:  Negative.   HENT: Negative.   Eyes: Negative.   Respiratory: Negative.   Cardiovascular: Negative.   Gastrointestinal: Negative.   Endocrine: Negative.   Genitourinary: Negative.   Musculoskeletal: Negative.   Skin: Negative.   Allergic/Immunologic: Negative.   Neurological: Negative.   Hematological: Negative.   Psychiatric/Behavioral: Negative.      Objective: Vital Signs: BP 138/83 (BP Location: Left Arm, Patient Position: Sitting)   Pulse 95   Ht 5\' 4"  (1.626 m)   Wt 200 lb (90.7 kg)   BMI 34.33 kg/m   Physical Exam  Constitutional: She is oriented to person, place, and time. She appears well-developed and well-nourished.  HENT:  Head: Normocephalic and atraumatic.  Eyes: Pupils are equal, round, and reactive to light. EOM are normal.  Neck: Normal range of motion. Neck supple.  Pulmonary/Chest: Effort normal and breath sounds normal.  Abdominal: Soft. Bowel sounds are normal.  Neurological: She is alert and oriented to person, place, and time.  Skin: Skin is warm and dry.  Psychiatric: She has a normal mood and affect. Her behavior is normal. Judgment and thought content normal.    Back Exam   Tenderness  The patient is experiencing tenderness in the cervical.  Range of Motion  Extension: abnormal  Flexion: normal  Lateral bend right: abnormal  Rotation right: abnormal   Comments:  Weak right triceps 4/5, weak right finger abduction 4/5, EDC is normal, wrist flexion strength is normal.    Right Shoulder Exam   Tenderness  The patient is experiencing tenderness in the acromioclavicular joint.  Range of Motion  Active abduction: abnormal  Passive abduction: abnormal  Extension: abnormal  External rotation: abnormal   Muscle Strength  Abduction: 5/5  Internal rotation: 5/5  External rotation: 5/5   Other  Sensation: normal      Specialty Comments:  No specialty comments available.  Imaging: No results found.   PMFS History: Patient  Active Problem List   Diagnosis Date Noted  . Complete tear of right rotator cuff 07/22/2017    Priority: High    Class: Chronic  . Cervical disc herniation 07/21/2017    Priority: High    Class: Acute  . Retained orthopedic hardware 07/22/2017    Priority: Medium    Class: Chronic  . Status post cervical spinal fusion 07/21/2017  . Chronic back pain    Past Medical History:  Diagnosis Date  . Anxiety   . Arthritis    rheumatoid , osteoarthritis  . Bronchitis   . Chronic back pain   . Depression   . Diabetes mellitus   . Fatty liver   . Fibromyalgia   . GERD (gastroesophageal reflux disease)   . Headache   . Hypertension   . Pancreatitis   . Pneumonia   . Rotator cuff tear, right     Family History  Problem Relation Age of Onset  . Hypertension Father   . Renal Disease Father   . Diabetes Mother   . Hypertension Mother   . Edema Mother   . Diabetes Sister   . Diabetes Brother     Past Surgical History:  Procedure Laterality Date  . ABDOMINAL HYSTERECTOMY    . ANTERIOR CERVICAL DECOMP/DISCECTOMY FUSION N/A 07/21/2017   Procedure: ANTERIOR CERVICAL DISCECTOMY AND FUSION C6-7, REMOVAL OF SCREW C6 PLATE, DEPUY ZERO-P IMPLANT, LOCAL BONE GRAFT, ALLOGRAFT BONE GRAFT, VIVIGEN;  Surgeon: Jessy Oto, MD;  Location: [facility];  Service: Orthopedics;  Laterality: N/A;  . BACK SURGERY     laminectomy  . BONE GRAFT HIP ILIAC CREST    . CARPAL TUNNEL RELEASE    . CERVICAL DISCECTOMY  07/21/2017  . CESAREAN SECTION    . CHOLECYSTECTOMY    . COLONOSCOPY    . frontal fusion    . neck fusion     patient reported  . SHOULDER ARTHROSCOPY WITH SUBACROMIAL DECOMPRESSION, ROTATOR CUFF REPAIR AND BICEP TENDON REPAIR Right 10/31/2017   Procedure: RIGHT SHOULDER ARTHROSCOPY, MANIPULATION UNDER ANESTHESIA, BICEPS TENODESIS, AND MINI OPEN ROTATOR CUFF TEAR REPAIR;  Surgeon: Meredith Pel, MD;  Location: [facility];  Service: Orthopedics;  Laterality: Right;   Social History    Occupational History  . Not on file  Tobacco Use  . Smoking status: Current Every Day Smoker    Packs/day: 0.50    Types: Cigarettes  . Smokeless tobacco: Never Used  Substance and Sexual Activity  . Alcohol use: No  . Drug use: Yes    Types: Marijuana  . Sexual activity: Not on file

## 2018-04-02 ENCOUNTER — Ambulatory Visit (INDEPENDENT_AMBULATORY_CARE_PROVIDER_SITE_OTHER): Payer: Medicare Other | Admitting: Specialist

## 2018-04-19 ENCOUNTER — Ambulatory Visit
Admission: RE | Admit: 2018-04-19 | Discharge: 2018-04-19 | Disposition: A | Discharge status: Home / Self Care | Payer: Medicare Other | Source: Ambulatory Visit | Attending: Specialist | Admitting: Specialist

## 2018-04-19 DIAGNOSIS — M25521 Pain in right elbow: Secondary | ICD-10-CM

## 2018-04-19 DIAGNOSIS — M542 Cervicalgia: Secondary | ICD-10-CM

## 2018-04-22 ENCOUNTER — Ambulatory Visit (INDEPENDENT_AMBULATORY_CARE_PROVIDER_SITE_OTHER): Payer: Medicare Other | Admitting: Specialist

## 2018-04-22 ENCOUNTER — Encounter (INDEPENDENT_AMBULATORY_CARE_PROVIDER_SITE_OTHER): Payer: Self-pay | Admitting: Specialist

## 2018-04-22 VITALS — BP 151/95 | HR 88 | Ht 64.0 in | Wt 200.0 lb

## 2018-04-22 DIAGNOSIS — M19021 Primary osteoarthritis, right elbow: Secondary | ICD-10-CM | POA: Diagnosis not present

## 2018-04-22 DIAGNOSIS — M25512 Pain in left shoulder: Secondary | ICD-10-CM | POA: Diagnosis not present

## 2018-04-22 DIAGNOSIS — M4322 Fusion of spine, cervical region: Secondary | ICD-10-CM

## 2018-04-22 MED ORDER — NAPROXEN 500 MG PO TABS: 500.0000 mg | ORAL_TABLET | Freq: Two times a day (BID) | ORAL | 3 refills | Status: DC

## 2018-04-22 MED ORDER — DICLOFENAC SODIUM 1 % TD GEL: 2.0000 g | Freq: Four times a day (QID) | TRANSDERMAL | 3 refills | Status: DC

## 2018-04-22 NOTE — Progress Notes (Signed)
Office Visit Note   [name]           Date of Birth: 08/31/1965           MRN: [id number] Visit Date: 04/22/2018              Requested by: Nolene Ebbs, MD [address] [place], [place] [address] PCP: Nolene Ebbs, MD   Assessment & Plan: Visit Diagnoses:  1. Primary osteoarthritis of right elbow   2. Acute pain of left shoulder   3. Fusion of spine, cervical region     Plan: ROM exercises are important to prevent stiffness in the elbow. Transdermal voltaren gel applied over the outside of the elbow 3-4 times per day. Hot showers, Ice after activity especially if swollen  Follow-Up Instructions: Return in about 4 weeks (around 05/20/2018).   Orders:  No orders of the defined types were placed in this encounter.  Meds ordered this encounter  Medications  . diclofenac sodium (VOLTAREN) 1 % GEL    Sig: Apply 2 g topically 4 (four) times daily.    Dispense:  3 Tube    Refill:  3  . naproxen (NAPROSYN) 500 MG tablet    Sig: Take 1 tablet (500 mg total) by mouth 2 (two) times daily with a meal.    Dispense:  60 tablet    Refill:  3      Procedures: No procedures performed   Clinical Data: No additional findings.   Subjective: Chief Complaint  Patient presents with  . Neck - Follow-up    MRI Review  . Right Elbow - Follow-up    MRI Review    52 year old female with history of neck pain and right elbow pain. She has has C5-6 and C6-7 fusions and now has right elbow pain. MRI right elbow and cervical spine. The studies showed Cervical spine with good central decompression very mild left foramenal narrowing C7. The right elbow MRI with primarily DJD involving the right radiocapitellum joint.    Review of Systems  Constitutional: Negative.   HENT: Negative.   Eyes: Negative.   Respiratory: Negative.   Cardiovascular: Negative.   Gastrointestinal: Negative.   Endocrine: Negative.   Genitourinary: Negative.   Musculoskeletal: Negative.    Skin: Negative.   Allergic/Immunologic: Negative.   Neurological: Negative.   Hematological: Negative.   Psychiatric/Behavioral: Negative.      Objective: Vital Signs: BP (!) 151/95 (BP Location: Left Arm, Patient Position: Sitting)   Pulse 88   Ht 5\' 4"  (1.626 m)   Wt 200 lb (90.7 kg)   BMI 34.33 kg/m   Physical Exam  Constitutional: She is oriented to person, place, and time. She appears well-developed and well-nourished.  HENT:  Head: Normocephalic and atraumatic.  Eyes: Pupils are equal, round, and reactive to light. EOM are normal.  Neck: Normal range of motion. Neck supple.  Pulmonary/Chest: Effort normal and breath sounds normal.  Abdominal: Soft. Bowel sounds are normal.  Neurological: She is alert and oriented to person, place, and time.  Skin: Skin is warm and dry.  Psychiatric: She has a normal mood and affect. Her behavior is normal. Judgment and thought content normal.    Right Elbow Exam   Tenderness  The patient is experiencing tenderness in the radial capitellar joint and lateral epicondyle.   Range of Motion  Extension:  -10 abnormal  Flexion: 130  Pronation: -20  Supination: 70   Muscle Strength  Pronation:  5/5  Supination:  5/5   Tests  Varus: negative Valgus: negative Tinel's sign (cubital tunnel): negative  Other  Erythema: absent Scars: absent Sensation: normal Pulse: present      Specialty Comments:  No specialty comments available.  Imaging: No results found.   PMFS History: Patient Active Problem List   Diagnosis Date Noted  . Complete tear of right rotator cuff 07/22/2017    Priority: High    Class: Chronic  . Cervical disc herniation 07/21/2017    Priority: High    Class: Acute  . Retained orthopedic hardware 07/22/2017    Priority: Medium    Class: Chronic  . Status post cervical spinal fusion 07/21/2017  . Chronic back pain    Past Medical History:  Diagnosis Date  . Anxiety   . Arthritis     rheumatoid , osteoarthritis  . Bronchitis   . Chronic back pain   . Depression   . Diabetes mellitus   . Fatty liver   . Fibromyalgia   . GERD (gastroesophageal reflux disease)   . Headache   . Hypertension   . Pancreatitis   . Pneumonia   . Rotator cuff tear, right     Family History  Problem Relation Age of Onset  . Hypertension Father   . Renal Disease Father   . Diabetes Mother   . Hypertension Mother   . Edema Mother   . Diabetes Sister   . Diabetes Brother     Past Surgical History:  Procedure Laterality Date  . ABDOMINAL HYSTERECTOMY    . ANTERIOR CERVICAL DECOMP/DISCECTOMY FUSION N/A 07/21/2017   Procedure: ANTERIOR CERVICAL DISCECTOMY AND FUSION C6-7, REMOVAL OF SCREW C6 PLATE, DEPUY ZERO-P IMPLANT, LOCAL BONE GRAFT, ALLOGRAFT BONE GRAFT, VIVIGEN;  Surgeon: Jessy Oto, MD;  Location: [facility];  Service: Orthopedics;  Laterality: N/A;  . BACK SURGERY     laminectomy  . BONE GRAFT HIP ILIAC CREST    . CARPAL TUNNEL RELEASE    . CERVICAL DISCECTOMY  07/21/2017  . CESAREAN SECTION    . CHOLECYSTECTOMY    . COLONOSCOPY    . frontal fusion    . neck fusion     patient reported  . SHOULDER ARTHROSCOPY WITH SUBACROMIAL DECOMPRESSION, ROTATOR CUFF REPAIR AND BICEP TENDON REPAIR Right 10/31/2017   Procedure: RIGHT SHOULDER ARTHROSCOPY, MANIPULATION UNDER ANESTHESIA, BICEPS TENODESIS, AND MINI OPEN ROTATOR CUFF TEAR REPAIR;  Surgeon: Meredith Pel, MD;  Location: [facility];  Service: Orthopedics;  Laterality: Right;   Social History   Occupational History  . Not on file  Tobacco Use  . Smoking status: Current Every Day Smoker    Packs/day: 0.50    Types: Cigarettes  . Smokeless tobacco: Never Used  Substance and Sexual Activity  . Alcohol use: No  . Drug use: Yes    Types: Marijuana  . Sexual activity: Not on file

## 2018-04-22 NOTE — Patient Instructions (Signed)
ROM exercises are important to prevent stiffness in the elbow. Transdermal voltaren gel applied over the outside of the elbow 3-4 times per day. Hot showers, Ice after activity especially if swollen

## 2018-04-27 ENCOUNTER — Other Ambulatory Visit: Payer: Self-pay | Admitting: Internal Medicine

## 2018-04-27 DIAGNOSIS — Z1231 Encounter for screening mammogram for malignant neoplasm of breast: Secondary | ICD-10-CM

## 2018-05-05 ENCOUNTER — Ambulatory Visit
Admission: RE | Admit: 2018-05-05 | Discharge: 2018-05-05 | Disposition: A | Discharge status: Home / Self Care | Payer: Medicare Other | Source: Ambulatory Visit | Attending: Internal Medicine | Admitting: Internal Medicine

## 2018-05-05 DIAGNOSIS — Z1231 Encounter for screening mammogram for malignant neoplasm of breast: Secondary | ICD-10-CM

## 2018-05-22 ENCOUNTER — Encounter (INDEPENDENT_AMBULATORY_CARE_PROVIDER_SITE_OTHER): Payer: Self-pay

## 2018-05-22 ENCOUNTER — Ambulatory Visit (INDEPENDENT_AMBULATORY_CARE_PROVIDER_SITE_OTHER): Payer: Medicare Other | Admitting: Specialist

## 2018-05-22 ENCOUNTER — Telehealth (INDEPENDENT_AMBULATORY_CARE_PROVIDER_SITE_OTHER): Payer: Self-pay | Admitting: Specialist

## 2018-05-22 NOTE — Telephone Encounter (Signed)
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  CB#[phone].  Thank you

## 2018-05-22 NOTE — Telephone Encounter (Signed)
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

## 2018-06-03 ENCOUNTER — Encounter (INDEPENDENT_AMBULATORY_CARE_PROVIDER_SITE_OTHER): Payer: Self-pay

## 2018-06-03 ENCOUNTER — Ambulatory Visit (INDEPENDENT_AMBULATORY_CARE_PROVIDER_SITE_OTHER): Payer: Medicare Other | Admitting: Surgery

## 2018-06-04 ENCOUNTER — Ambulatory Visit
Admission: RE | Admit: 2018-06-04 | Discharge: 2018-06-04 | Disposition: A | Discharge status: Home / Self Care | Payer: Medicare Other | Source: Ambulatory Visit | Attending: Internal Medicine | Admitting: Internal Medicine

## 2018-06-04 ENCOUNTER — Ambulatory Visit: Payer: Medicare Other

## 2018-06-09 ENCOUNTER — Ambulatory Visit (INDEPENDENT_AMBULATORY_CARE_PROVIDER_SITE_OTHER): Payer: Medicare Other | Admitting: Surgery

## 2018-06-09 ENCOUNTER — Encounter (INDEPENDENT_AMBULATORY_CARE_PROVIDER_SITE_OTHER): Payer: Self-pay | Admitting: Surgery

## 2018-06-09 VITALS — Ht 64.0 in | Wt 200.0 lb

## 2018-06-09 DIAGNOSIS — M654 Radial styloid tenosynovitis [de Quervain]: Secondary | ICD-10-CM

## 2018-06-09 DIAGNOSIS — M19021 Primary osteoarthritis, right elbow: Secondary | ICD-10-CM | POA: Diagnosis not present

## 2018-06-09 DIAGNOSIS — M19029 Primary osteoarthritis, unspecified elbow: Secondary | ICD-10-CM

## 2018-06-09 NOTE — Progress Notes (Signed)
Office Visit Note   [name]           Date of Birth: July 16, 1966           MRN: [id number] Visit Date: 06/09/2018              Requested by: Nolene Ebbs, MD [address] [place], [place] [address] PCP: Nolene Ebbs, MD   Assessment & Plan: Visit Diagnoses:  1. Elbow arthritis   2. De Quervain's tenosynovitis, left     Plan: Regards to the elbow recommend getting a consult with Dr. Daryll Brod to see what he recommends.  We will follow-up with in our clinic for left de Quervain's in a couple weeks and may consider injection. Follow-Up Instructions: Return in about 2 weeks (around 06/23/2018) for LEFT THUMB.   Orders:  Orders Placed This Encounter  Procedures  . Ambulatory referral to Orthopedic Surgery   No orders of the defined types were placed in this encounter.     Procedures: No procedures performed   Clinical Data: No additional findings.   Subjective: Chief Complaint  Patient presents with  . Neck - Follow-up  . Left Shoulder - Follow-up    HPI Patient returns office with complaints of right elbow pain and also with.  Patient has known history of right elbow DJD and this was seen on MRI April 20, 2018.  Patient has a new complaint of left thumb pain and also pain along the radial aspect of the wrist.  This has been ongoing x3 weeks.  No injury.  Pain when she is gripping objects.  No complaints of numbness tingling. Review of Systems No current cardiac pulmonary GI GU issues  Objective: Vital Signs: Ht 5\' 4"  (1.626 m)   Wt 200 lb (90.7 kg)   BMI 34.33 kg/m   Physical Exam HENT:     Head: Normocephalic and atraumatic.  Eyes:     Extraocular Movements: Extraocular movements intact.     Pupils: Pupils are equal, round, and reactive to light.  Musculoskeletal:     Comments: Right elbow is diffusely tender.  Left hand she is moderate to market tender over the first dorsal compartment.  Does have some swelling there.  No redness  or bruising.  Positive Finkelstein's test.  Skin:    General: Skin is warm and dry.  Neurological:     General: No focal deficit present.     Mental Status: She is alert and oriented to person, place, and time.     Ortho Exam  Specialty Comments:  No specialty comments available.  Imaging: No results found.   PMFS History: Patient Active Problem List   Diagnosis Date Noted  . Complete tear of right rotator cuff 07/22/2017    Class: Chronic  . Retained orthopedic hardware 07/22/2017    Class: Chronic  . Cervical disc herniation 07/21/2017    Class: Acute  . Status post cervical spinal fusion 07/21/2017  . Chronic back pain    Past Medical History:  Diagnosis Date  . Anxiety   . Arthritis    rheumatoid , osteoarthritis  . Bronchitis   . Chronic back pain   . Depression   . Diabetes mellitus   . Fatty liver   . Fibromyalgia   . GERD (gastroesophageal reflux disease)   . Headache   . Hypertension   . Pancreatitis   . Pneumonia   . Rotator cuff tear, right     Family History  Problem Relation Age of  Onset  . Hypertension Father   . Renal Disease Father   . Diabetes Mother   . Hypertension Mother   . Edema Mother   . Diabetes Sister   . Diabetes Brother     Past Surgical History:  Procedure Laterality Date  . ABDOMINAL HYSTERECTOMY    . ANTERIOR CERVICAL DECOMP/DISCECTOMY FUSION N/A 07/21/2017   Procedure: ANTERIOR CERVICAL DISCECTOMY AND FUSION C6-7, REMOVAL OF SCREW C6 PLATE, DEPUY ZERO-P IMPLANT, LOCAL BONE GRAFT, ALLOGRAFT BONE GRAFT, VIVIGEN;  Surgeon: Jessy Oto, MD;  Location: [facility];  Service: Orthopedics;  Laterality: N/A;  . BACK SURGERY     laminectomy  . BONE GRAFT HIP ILIAC CREST    . BREAST BIOPSY    . CARPAL TUNNEL RELEASE    . CERVICAL DISCECTOMY  07/21/2017  . CESAREAN SECTION    . CHOLECYSTECTOMY    . COLONOSCOPY    . frontal fusion    . neck fusion     patient reported  . SHOULDER ARTHROSCOPY WITH SUBACROMIAL DECOMPRESSION,  ROTATOR CUFF REPAIR AND BICEP TENDON REPAIR Right 10/31/2017   Procedure: RIGHT SHOULDER ARTHROSCOPY, MANIPULATION UNDER ANESTHESIA, BICEPS TENODESIS, AND MINI OPEN ROTATOR CUFF TEAR REPAIR;  Surgeon: Meredith Pel, MD;  Location: [facility];  Service: Orthopedics;  Laterality: Right;   Social History   Occupational History  . Not on file  Tobacco Use  . Smoking status: Current Every Day Smoker    Packs/day: 0.50    Types: Cigarettes  . Smokeless tobacco: Never Used  Substance and Sexual Activity  . Alcohol use: No  . Drug use: Yes    Types: Marijuana  . Sexual activity: Not on file

## 2018-06-15 ENCOUNTER — Ambulatory Visit (INDEPENDENT_AMBULATORY_CARE_PROVIDER_SITE_OTHER): Payer: Medicare Other | Admitting: Orthopedic Surgery

## 2018-06-24 ENCOUNTER — Encounter (INDEPENDENT_AMBULATORY_CARE_PROVIDER_SITE_OTHER): Payer: Self-pay | Admitting: Surgery

## 2018-06-24 ENCOUNTER — Ambulatory Visit (INDEPENDENT_AMBULATORY_CARE_PROVIDER_SITE_OTHER): Payer: Medicare Other | Admitting: Surgery

## 2018-06-24 VITALS — Ht 64.0 in | Wt 200.0 lb

## 2018-06-24 DIAGNOSIS — M654 Radial styloid tenosynovitis [de Quervain]: Secondary | ICD-10-CM

## 2018-06-24 MED ORDER — BUPIVACAINE HCL 0.5 % IJ SOLN
0.5000 mL | INTRAMUSCULAR | Status: AC | PRN
  Administered 2018-06-24: .5 mL

## 2018-06-24 NOTE — Progress Notes (Signed)
Office Visit Note   [name]           Date of Birth: 09-Dec-1965           MRN: [id number] Visit Date: 06/24/2018              Requested by: Nolene Ebbs, MD [address] [place], [place] [address] PCP: Nolene Ebbs, MD   Assessment & Plan: Visit Diagnoses:  1. De Quervain's tenosynovitis, left     Plan: In hopes of giving patient relief of her pain offered injection.  After patient consent  first dorsal compartment Marcaine/Depo-Medrol injection was performed.  Good relief after injection.  Patient will follow-up in 3 weeks for recheck.  If she does not have long-term improvement we did discuss surgical intervention with outpatient first dorsal compartment release.  She was given ice pack today.  Follow-Up Instructions: Return in about 3 weeks (around 07/15/2018) for with Jeneen Rinks.   Orders:  No orders of the defined types were placed in this encounter.  No orders of the defined types were placed in this encounter.     Procedures: Hand/UE Inj: L extensor compartment 1 for de Quervain's tenosynovitis on 06/24/2018 10:40 AM Indications: pain Details: 25 G needle, radial approach Medications: 0.5 mL bupivacaine 0.5 % Outcome: tolerated well, no immediate complications  Patient reported very good relief after sitting for a couple of minutes with Marcaine in place. Consent was given by the patient. Patient was prepped and draped in the usual sterile fashion.       Clinical Data: No additional findings.   Subjective: Chief Complaint  Patient presents with  . Left Thumb - Pain, Follow-up    HPI -year-old black female with left de Quervain's synovitis returns.  He is to have ongoing pain in the wrist and thumb.  No improvement with topical anti-inflammatory.  Last office visit we did discuss trying an injection. Review of Systems No current cardiac pulmonary GI GU issues  Objective: Vital Signs: Ht 5\' 4"  (1.626 m)   Wt 200 lb (90.7 kg)   BMI  34.33 kg/m   Physical Exam Pleasant white female alert and oriented in no acute distress.  She continues to be exquisitely tender over the left first dorsal compartment.  Positive Finkelstein's test.  With thumb extension I do feel popping over the first dorsal compartment.  Neurovascular intact. Ortho Exam  Specialty Comments:  No specialty comments available.  Imaging: No results found.   PMFS History: Patient Active Problem List   Diagnosis Date Noted  . Complete tear of right rotator cuff 07/22/2017    Class: Chronic  . Retained orthopedic hardware 07/22/2017    Class: Chronic  . Cervical disc herniation 07/21/2017    Class: Acute  . Status post cervical spinal fusion 07/21/2017  . Chronic back pain    Past Medical History:  Diagnosis Date  . Anxiety   . Arthritis    rheumatoid , osteoarthritis  . Bronchitis   . Chronic back pain   . Depression   . Diabetes mellitus   . Fatty liver   . Fibromyalgia   . GERD (gastroesophageal reflux disease)   . Headache   . Hypertension   . Pancreatitis   . Pneumonia   . Rotator cuff tear, right     Family History  Problem Relation Age of Onset  . Hypertension Father   . Renal Disease Father   . Diabetes Mother   . Hypertension Mother   . Edema Mother   .  Diabetes Sister   . Diabetes Brother     Past Surgical History:  Procedure Laterality Date  . ABDOMINAL HYSTERECTOMY    . ANTERIOR CERVICAL DECOMP/DISCECTOMY FUSION N/A 07/21/2017   Procedure: ANTERIOR CERVICAL DISCECTOMY AND FUSION C6-7, REMOVAL OF SCREW C6 PLATE, DEPUY ZERO-P IMPLANT, LOCAL BONE GRAFT, ALLOGRAFT BONE GRAFT, VIVIGEN;  Surgeon: Jessy Oto, MD;  Location: [facility];  Service: Orthopedics;  Laterality: N/A;  . BACK SURGERY     laminectomy  . BONE GRAFT HIP ILIAC CREST    . BREAST BIOPSY    . CARPAL TUNNEL RELEASE    . CERVICAL DISCECTOMY  07/21/2017  . CESAREAN SECTION    . CHOLECYSTECTOMY    . COLONOSCOPY    . frontal fusion    . neck fusion       patient reported  . SHOULDER ARTHROSCOPY WITH SUBACROMIAL DECOMPRESSION, ROTATOR CUFF REPAIR AND BICEP TENDON REPAIR Right 10/31/2017   Procedure: RIGHT SHOULDER ARTHROSCOPY, MANIPULATION UNDER ANESTHESIA, BICEPS TENODESIS, AND MINI OPEN ROTATOR CUFF TEAR REPAIR;  Surgeon: Meredith Pel, MD;  Location: [facility];  Service: Orthopedics;  Laterality: Right;   Social History   Occupational History  . Not on file  Tobacco Use  . Smoking status: Current Every Day Smoker    Packs/day: 0.50    Types: Cigarettes  . Smokeless tobacco: Never Used  Substance and Sexual Activity  . Alcohol use: No  . Drug use: Yes    Types: Marijuana  . Sexual activity: Not on file

## 2018-07-06 DIAGNOSIS — M654 Radial styloid tenosynovitis [de Quervain]: Secondary | ICD-10-CM | POA: Insufficient documentation

## 2018-07-06 DIAGNOSIS — M19029 Primary osteoarthritis, unspecified elbow: Secondary | ICD-10-CM | POA: Insufficient documentation

## 2018-07-06 DIAGNOSIS — R52 Pain, unspecified: Secondary | ICD-10-CM | POA: Insufficient documentation

## 2018-08-06 ENCOUNTER — Other Ambulatory Visit (INDEPENDENT_AMBULATORY_CARE_PROVIDER_SITE_OTHER): Payer: Self-pay | Admitting: Specialist

## 2018-08-06 NOTE — Telephone Encounter (Signed)
Amitriptyline refill request---can you please advise?

## 2018-08-07 NOTE — Telephone Encounter (Signed)
Ok to refill #30, NO REFILLS

## 2018-08-13 ENCOUNTER — Encounter (HOSPITAL_COMMUNITY): Payer: Self-pay | Admitting: Emergency Medicine

## 2018-08-13 ENCOUNTER — Other Ambulatory Visit: Payer: Self-pay

## 2018-08-13 ENCOUNTER — Ambulatory Visit (HOSPITAL_COMMUNITY)
Admission: EM | Admit: 2018-08-13 | Discharge: 2018-08-13 | Disposition: A | Discharge status: Home / Self Care | Payer: Medicare Other | Attending: Emergency Medicine | Admitting: Emergency Medicine

## 2018-08-13 DIAGNOSIS — M26621 Arthralgia of right temporomandibular joint: Secondary | ICD-10-CM | POA: Insufficient documentation

## 2018-08-13 MED ORDER — MELOXICAM 7.5 MG PO TABS: 7.5000 mg | ORAL_TABLET | Freq: Every day | ORAL | 0 refills | Status: AC

## 2018-08-13 NOTE — ED Triage Notes (Signed)
PT reports right ear fullness for 2 weeks.

## 2018-08-13 NOTE — Discharge Instructions (Signed)
Ear pain likely from inflammation in jaw joint. Please take mobic daily with food Warm compresses to help with swelling and discomfort May try flonase nasal spray daily if related to eustachian tube dysfunction  Follow up if not improving or worsening

## 2018-08-14 NOTE — ED Provider Notes (Signed)
[place]    CSN: [id number] Arrival date & time: 08/13/18  1219     History   Chief Complaint Chief Complaint  Patient presents with  . Ear Fullness    HPI [name] is a 53 y.o. female history of hypertension, GERD, fibromyalgia presenting today for evaluation of right ear discomfort.  Patient states that for the past 2 weeks she has had discomfort in her right ear.  She has felt as if there is swelling in this area.  Denies any drainage.  Denies changes in her hearing.  Denies associated URI symptoms of congestion, sore throat or cough.  Denies any fevers.  Does have worsening pain with moving her jaw.  Notes that she had a tooth pulled on her left upper jaw a few weeks ago but does not have any pain on this side.  Does not have any dental issues on the right side of her jaw.  Does note that she grinds her teeth a lot.  Does admit to increased stressors recently.  HPI  Past Medical History:  Diagnosis Date  . Anxiety   . Arthritis    rheumatoid , osteoarthritis  . Bronchitis   . Chronic back pain   . Depression   . Diabetes mellitus   . Fatty liver   . Fibromyalgia   . GERD (gastroesophageal reflux disease)   . Headache   . Hypertension   . Pancreatitis   . Pneumonia   . Rotator cuff tear, right     Patient Active Problem List   Diagnosis Date Noted  . Complete tear of right rotator cuff 07/22/2017    Class: Chronic  . Retained orthopedic hardware 07/22/2017    Class: Chronic  . Cervical disc herniation 07/21/2017    Class: Acute  . Status post cervical spinal fusion 07/21/2017  . Chronic back pain     Past Surgical History:  Procedure Laterality Date  . ABDOMINAL HYSTERECTOMY    . ANTERIOR CERVICAL DECOMP/DISCECTOMY FUSION N/A 07/21/2017   Procedure: ANTERIOR CERVICAL DISCECTOMY AND FUSION C6-7, REMOVAL OF SCREW C6 PLATE, DEPUY ZERO-P IMPLANT, LOCAL BONE GRAFT, ALLOGRAFT BONE GRAFT, VIVIGEN;  Surgeon: Jessy Oto, MD;  Location: [facility];   Service: Orthopedics;  Laterality: N/A;  . BACK SURGERY     laminectomy  . BONE GRAFT HIP ILIAC CREST    . BREAST BIOPSY    . CARPAL TUNNEL RELEASE    . CERVICAL DISCECTOMY  07/21/2017  . CESAREAN SECTION    . CHOLECYSTECTOMY    . COLONOSCOPY    . frontal fusion    . neck fusion     patient reported  . SHOULDER ARTHROSCOPY WITH SUBACROMIAL DECOMPRESSION, ROTATOR CUFF REPAIR AND BICEP TENDON REPAIR Right 10/31/2017   Procedure: RIGHT SHOULDER ARTHROSCOPY, MANIPULATION UNDER ANESTHESIA, BICEPS TENODESIS, AND MINI OPEN ROTATOR CUFF TEAR REPAIR;  Surgeon: Meredith Pel, MD;  Location: [facility];  Service: Orthopedics;  Laterality: Right;    OB History   No obstetric history on file.      Home Medications    Prior to Admission medications   Medication Sig Start Date End Date Taking? Authorizing Provider  albuterol (PROVENTIL HFA;VENTOLIN HFA) 108 (90 BASE) MCG/ACT inhaler Inhale 2 puffs into the lungs every 4 (four) hours as needed for wheezing.   Yes [provider]  ALPRAZolam Duanne Moron) 0.5 MG tablet Take 0.5 mg by mouth daily as needed for anxiety.    Yes [provider]  amitriptyline (ELAVIL) 10 MG  tablet TAKE 1 TABLET(10 MG) BY MOUTH AT BEDTIME 08/07/18  Yes Stanbery, Mary L, PA-C  AZOR 5-40 MG per tablet Take 1 tablet by mouth daily.  02/06/14  Yes [provider]  cyclobenzaprine (FLEXERIL) 10 MG tablet TAKE 1 TABLET BY MOUTH THREE TIMES DAILY( EVERY EIGHT HOURS) FOR SPASMS 12/18/17  Yes Meredith Pel, MD  fluticasone [facility]) 50 MCG/ACT nasal spray Place 2 sprays into both nostrils daily as needed for allergies or rhinitis.    Yes [provider]  gabapentin (NEURONTIN) 100 MG capsule Take 1 capsule (100 mg total) by mouth at bedtime. 07/23/17  Yes Jessy Oto, MD  insulin glargine (LANTUS) 100 unit/mL SOPN Inject 10 Units into the skin at bedtime.   Yes [provider]  metFORMIN (GLUCOPHAGE) 1000 MG tablet Take 1,000 mg by  mouth 2 (two) times daily. 07/29/17  Yes [provider]  metoprolol succinate (TOPROL-XL) 50 MG 24 hr tablet Take 50 mg by mouth daily. 07/21/14  Yes [provider]  phentermine (ADIPEX-P) 37.5 MG tablet Take 37.5 mg by mouth daily. 12/16/17  Yes [provider]  simvastatin (ZOCOR) 20 MG tablet Take 20 mg by mouth daily.    Yes [provider]  Vilazodone HCl (VIIBRYD) 10 MG TABS Take 10 mg by mouth daily.   Yes [provider]  albuterol (PROVENTIL) (2.5 MG/3ML) 0.083% nebulizer solution Take 2.5 mg by nebulization every 6 (six) hours as needed for wheezing or shortness of breath.    [provider]  baclofen (LIORESAL) 10 MG tablet 1 po tid prn 11/13/17   Meredith Pel, MD  diazepam (VALIUM) 5 MG tablet Take one tablet po at the MRI scanner, may repeat x 1. 03/30/18   Jessy Oto, MD  diclofenac sodium (VOLTAREN) 1 % GEL Apply 2 g topically 4 (four) times daily. 04/22/18   Jessy Oto, MD  Hypromellose (ARTIFICIAL TEARS OP) Apply 1-2 drops to eye daily as needed (for dry eyes).     [provider]  meloxicam (MOBIC) 7.5 MG tablet Take 1 tablet (7.5 mg total) by mouth daily for 7 days. Take in the morning, with food. 08/13/18 08/20/18  Kezia Benevides C, PA-C  Menthol-Methyl Salicylate (MUSCLE RUB EX) Apply 1 application topically daily as needed (for pain).     [provider]  naproxen (NAPROSYN) 500 MG tablet Take 1 tablet (500 mg total) by mouth 2 (two) times daily with a meal. 04/22/18   Jessy Oto, MD  oxycodone (OXY-IR) 5 MG capsule 1 to 2 po q 4hrs prn pnain 11/13/17   Meredith Pel, MD  oxyCODONE (ROXICODONE) 5 MG immediate release tablet 1-2 po q 6 hrs prn pain 12/11/17   Meredith Pel, MD  TRULICITY 1.5 ST/4.1DQ SOPN INJECT SUBCUTANEOUSLY EVERY WEEK 01/20/18   [provider]    Family History Family History  Problem Relation Age of Onset  . Hypertension Father   . Renal Disease Father     . Diabetes Mother   . Hypertension Mother   . Edema Mother   . Diabetes Sister   . Diabetes Brother     Social History Social History   Tobacco Use  . Smoking status: Current Every Day Smoker    Packs/day: 0.50    Types: Cigarettes  . Smokeless tobacco: Never Used  Substance Use Topics  . Alcohol use: No  . Drug use: Yes    Types: Marijuana     Allergies  Patient has no known allergies.   Review of Systems Review of Systems  Constitutional: Negative for activity change, appetite change, chills, fatigue and fever.  HENT: Positive for ear pain. Negative for congestion, ear discharge, rhinorrhea, sinus pressure, sore throat and trouble swallowing.   Eyes: Negative for discharge and redness.  Respiratory: Negative for cough, chest tightness and shortness of breath.   Cardiovascular: Negative for chest pain.  Gastrointestinal: Negative for abdominal pain, diarrhea, nausea and vomiting.  Musculoskeletal: Negative for myalgias.  Skin: Negative for rash.  Neurological: Negative for dizziness, light-headedness and headaches.     Physical Exam Triage Vital Signs ED Triage Vitals  Enc Vitals Group     BP 08/13/18 1402 (!) 148/84     Pulse Rate 08/13/18 1402 79     Resp 08/13/18 1402 16     Temp 08/13/18 1402 98 F (36.7 C)     Temp Source 08/13/18 1402 Oral     SpO2 08/13/18 1402 98 %     Weight --      Height --      Head Circumference --      Peak Flow --      Pain Score 08/13/18 1401 8     Pain Loc --      Pain Edu? --      Excl. in [place]? --    No data found.  Updated Vital Signs BP (!) 148/84   Pulse 79   Temp 98 F (36.7 C) (Oral)   Resp 16   SpO2 98%   Visual Acuity Right Eye Distance:   Left Eye Distance:   Bilateral Distance:    Right Eye Near:   Left Eye Near:    Bilateral Near:     Physical Exam Vitals signs and nursing note reviewed.  Constitutional:      General: She is not in acute distress.    Appearance: She is well-developed.   HENT:     Head: Normocephalic and atraumatic.     Comments: Tenderness to palpation near TMJ/masseter Patient able to fully open mouth, causes pain No overlying swelling    Ears:     Comments: Bilateral ears without tenderness to palpation of external auricle, tragus and mastoid, EAC's without erythema or swelling, TM's with good bony landmarks and cone of light. Non erythematous.     Mouth/Throat:     Comments: Oral mucosa pink and moist, no tonsillar enlargement or exudate. Posterior pharynx patent and nonerythematous, no uvula deviation or swelling. Normal phonation. No gingival tenderness around right upper jaw/posterior molars Eyes:     Extraocular Movements: Extraocular movements intact.     Conjunctiva/sclera: Conjunctivae normal.     Pupils: Pupils are equal, round, and reactive to light.  Neck:     Musculoskeletal: Neck supple.     Comments: Full active range of motion of neck, no overlying swelling or erythema Cardiovascular:     Rate and Rhythm: Normal rate and regular rhythm.     Heart sounds: No murmur.  Pulmonary:     Effort: Pulmonary effort is normal. No respiratory distress.     Breath sounds: Normal breath sounds.     Comments: Breathing comfortably at rest, CTABL, no wheezing, rales or other adventitious sounds auscultated  Abdominal:     Palpations: Abdomen is soft.     Tenderness: There is no abdominal tenderness.  Skin:    General: Skin is warm and dry.  Neurological:     Mental Status: She is alert.  UC Treatments / Results  Labs (all labs ordered are listed, but only abnormal results are displayed) Labs Reviewed - No data to display  EKG None  Radiology No results found.  Procedures Procedures (including critical care time)  Medications Ordered in UC Medications - No data to display  Initial Impression / Assessment and Plan / UC Course  I have reviewed the triage vital signs and the nursing notes.  Pertinent labs & imaging results  that were available during my care of the patient were reviewed by me and considered in my medical decision making (see chart for details).     Ear exam normal.  Initially discussed possibility of underlying eustachian tube dysfunction contributing to disc comfort in ears.  Feels symptoms are more likely related to TMJ inflammation given grinding, increased stress, tenderness to palpation over TMJ as well as pain with opening jaw.  Will recommend anti-inflammatories.  Patient has been using ibuprofen.  Patient has history of diabetes, last A1c reported in the 9% range.  Will avoid prednisone.  Will provide Mobic and have him apply warm compresses.  Continue to monitor,Discussed strict return precautions. Patient verbalized understanding and is agreeable with plan.  Final Clinical Impressions(s) / UC Diagnoses   Final diagnoses:  Arthralgia of right temporomandibular joint     Discharge Instructions     Ear pain likely from inflammation in jaw joint. Please take mobic daily with food Warm compresses to help with swelling and discomfort May try flonase nasal spray daily if related to eustachian tube dysfunction  Follow up if not improving or worsening   ED Prescriptions    Medication Sig Dispense Auth. Provider   meloxicam (MOBIC) 7.5 MG tablet Take 1 tablet (7.5 mg total) by mouth daily for 7 days. Take in the morning, with food. 7 tablet Delayla Hoffmaster C, PA-C     Controlled Substance Prescriptions [place] Controlled Substance Registry consulted? Not Applicable   Janith Lima, [place] 08/14/18 1202

## 2018-08-20 ENCOUNTER — Encounter (HOSPITAL_COMMUNITY): Payer: Self-pay | Admitting: Emergency Medicine

## 2018-08-20 ENCOUNTER — Emergency Department (HOSPITAL_COMMUNITY)
Admission: EM | Admit: 2018-08-20 | Discharge: 2018-08-21 | Disposition: A | Discharge status: Home / Self Care | Payer: Medicare Other | Attending: Emergency Medicine | Admitting: Emergency Medicine

## 2018-08-20 ENCOUNTER — Other Ambulatory Visit: Payer: Self-pay

## 2018-08-20 DIAGNOSIS — Z794 Long term (current) use of insulin: Secondary | ICD-10-CM | POA: Insufficient documentation

## 2018-08-20 DIAGNOSIS — Z79899 Other long term (current) drug therapy: Secondary | ICD-10-CM | POA: Diagnosis not present

## 2018-08-20 DIAGNOSIS — I10 Essential (primary) hypertension: Secondary | ICD-10-CM | POA: Diagnosis not present

## 2018-08-20 DIAGNOSIS — J029 Acute pharyngitis, unspecified: Secondary | ICD-10-CM

## 2018-08-20 DIAGNOSIS — M26623 Arthralgia of bilateral temporomandibular joint: Secondary | ICD-10-CM | POA: Diagnosis not present

## 2018-08-20 DIAGNOSIS — F17210 Nicotine dependence, cigarettes, uncomplicated: Secondary | ICD-10-CM | POA: Insufficient documentation

## 2018-08-20 DIAGNOSIS — M069 Rheumatoid arthritis, unspecified: Secondary | ICD-10-CM | POA: Insufficient documentation

## 2018-08-20 DIAGNOSIS — E119 Type 2 diabetes mellitus without complications: Secondary | ICD-10-CM | POA: Diagnosis not present

## 2018-08-20 LAB — GROUP A STREP BY PCR: GROUP A STREP BY PCR: NOT DETECTED

## 2018-08-20 MED ORDER — METHOCARBAMOL 500 MG PO TABS
500.0000 mg | ORAL_TABLET | Freq: Once | ORAL | Status: AC
  Administered 2018-08-21: 500 mg via ORAL
  Filled 2018-08-20: qty 1

## 2018-08-20 MED ORDER — ACETAMINOPHEN 325 MG PO TABS
650.0000 mg | ORAL_TABLET | Freq: Once | ORAL | Status: AC | PRN
  Administered 2018-08-20: 650 mg via ORAL
  Filled 2018-08-20: qty 2

## 2018-08-20 NOTE — ED Triage Notes (Signed)
Pt states she has had throat pain and ear pain since Sunday. States she was seen on Monday at [facility] and states pain is no better. Denies other symptoms

## 2018-08-20 NOTE — ED Notes (Signed)
Pt c/o sharp pain in bil ears

## 2018-08-20 NOTE — ED Provider Notes (Signed)
[facility] EMERGENCY DEPARTMENT Provider Note   CSN: [id number] Arrival date & time: 08/20/18  2028     History   Chief Complaint Chief Complaint  Patient presents with  . Sore Throat  . Otalgia    HPI [name] is a 53 y.o. female.  The history is provided by the patient and medical records.  Sore Throat   Otalgia     53 year old female with history of anxiety, rheumatoid arthritis, chronic back pain, depression, diabetes, fibromyalgia, GERD, presenting to the ED with bilateral ear pain and sore throat.  Reports she was seen at urgent care last week, told everything looked normal and was started on meloxicam.  States she has been taking this without any significant relief.  She reports she has significant pain in the ears and down the jaw anytime she opens her mouth.  She did have a left upper molar removed about 2 weeks ago and is not had any issues with that.  She is not had any follow-up with her dentist.  She denies any changes in hearing.  No fever or chills.  Also for her throat feels very sore.  She is not had any difficulty swallowing but states it is painful when doing so.  Throat also feels very "dry".  She has been using over-the-counter Chloraseptic and throat lozenges without relief.  Past Medical History:  Diagnosis Date  . Anxiety   . Arthritis    rheumatoid , osteoarthritis  . Bronchitis   . Chronic back pain   . Depression   . Diabetes mellitus   . Fatty liver   . Fibromyalgia   . GERD (gastroesophageal reflux disease)   . Headache   . Hypertension   . Pancreatitis   . Pneumonia   . Rotator cuff tear, right     Patient Active Problem List   Diagnosis Date Noted  . Complete tear of right rotator cuff 07/22/2017    Class: Chronic  . Retained orthopedic hardware 07/22/2017    Class: Chronic  . Cervical disc herniation 07/21/2017    Class: Acute  . Status post cervical spinal fusion 07/21/2017  . Chronic back pain     Past  Surgical History:  Procedure Laterality Date  . ABDOMINAL HYSTERECTOMY    . ANTERIOR CERVICAL DECOMP/DISCECTOMY FUSION N/A 07/21/2017   Procedure: ANTERIOR CERVICAL DISCECTOMY AND FUSION C6-7, REMOVAL OF SCREW C6 PLATE, DEPUY ZERO-P IMPLANT, LOCAL BONE GRAFT, ALLOGRAFT BONE GRAFT, VIVIGEN;  Surgeon: Jessy Oto, MD;  Location: [place];  Service: Orthopedics;  Laterality: N/A;  . BACK SURGERY     laminectomy  . BONE GRAFT HIP ILIAC CREST    . BREAST BIOPSY    . CARPAL TUNNEL RELEASE    . CERVICAL DISCECTOMY  07/21/2017  . CESAREAN SECTION    . CHOLECYSTECTOMY    . COLONOSCOPY    . frontal fusion    . neck fusion     patient reported  . SHOULDER ARTHROSCOPY WITH SUBACROMIAL DECOMPRESSION, ROTATOR CUFF REPAIR AND BICEP TENDON REPAIR Right 10/31/2017   Procedure: RIGHT SHOULDER ARTHROSCOPY, MANIPULATION UNDER ANESTHESIA, BICEPS TENODESIS, AND MINI OPEN ROTATOR CUFF TEAR REPAIR;  Surgeon: Meredith Pel, MD;  Location: [place];  Service: Orthopedics;  Laterality: Right;     OB History   No obstetric history on file.      Home Medications    Prior to Admission medications   Medication Sig Start Date End Date Taking? Authorizing Provider  albuterol (PROVENTIL HFA;VENTOLIN  HFA) 108 (90 BASE) MCG/ACT inhaler Inhale 2 puffs into the lungs every 4 (four) hours as needed for wheezing.    [provider]  albuterol (PROVENTIL) (2.5 MG/3ML) 0.083% nebulizer solution Take 2.5 mg by nebulization every 6 (six) hours as needed for wheezing or shortness of breath.    [provider]  ALPRAZolam Duanne Moron) 0.5 MG tablet Take 0.5 mg by mouth daily as needed for anxiety.     [provider]  amitriptyline (ELAVIL) 10 MG tablet TAKE 1 TABLET(10 MG) BY MOUTH AT BEDTIME 08/07/18   Dwana Melena L, PA-C  AZOR 5-40 MG per tablet Take 1 tablet by mouth daily.  02/06/14   [provider]  baclofen (LIORESAL) 10 MG tablet 1 po tid prn 11/13/17   Meredith Pel, MD    cyclobenzaprine (FLEXERIL) 10 MG tablet TAKE 1 TABLET BY MOUTH THREE TIMES DAILY( EVERY EIGHT HOURS) FOR SPASMS 12/18/17   Marlou Sa Tonna Corner, MD  diazepam (VALIUM) 5 MG tablet Take one tablet po at the MRI scanner, may repeat x 1. 03/30/18   Jessy Oto, MD  diclofenac sodium (VOLTAREN) 1 % GEL Apply 2 g topically 4 (four) times daily. 04/22/18   Jessy Oto, MD  fluticasone (FLONASE) 50 MCG/ACT nasal spray Place 2 sprays into both nostrils daily as needed for allergies or rhinitis.     [provider]  gabapentin (NEURONTIN) 100 MG capsule Take 1 capsule (100 mg total) by mouth at bedtime. 07/23/17   Jessy Oto, MD  Hypromellose (ARTIFICIAL TEARS OP) Apply 1-2 drops to eye daily as needed (for dry eyes).     [provider]  insulin glargine (LANTUS) 100 unit/mL SOPN Inject 10 Units into the skin at bedtime.    [provider]  meloxicam (MOBIC) 7.5 MG tablet Take 1 tablet (7.5 mg total) by mouth daily for 7 days. Take in the morning, with food. 08/13/18 08/20/18  Wieters, Hallie C, PA-C  Menthol-Methyl Salicylate (MUSCLE RUB EX) Apply 1 application topically daily as needed (for pain).     [provider]  metFORMIN (GLUCOPHAGE) 1000 MG tablet Take 1,000 mg by mouth 2 (two) times daily. 07/29/17   [provider]  metoprolol succinate (TOPROL-XL) 50 MG 24 hr tablet Take 50 mg by mouth daily. 07/21/14   [provider]  naproxen (NAPROSYN) 500 MG tablet Take 1 tablet (500 mg total) by mouth 2 (two) times daily with a meal. 04/22/18   Jessy Oto, MD  oxycodone (OXY-IR) 5 MG capsule 1 to 2 po q 4hrs prn pnain 11/13/17   Meredith Pel, MD  oxyCODONE (ROXICODONE) 5 MG immediate release tablet 1-2 po q 6 hrs prn pain 12/11/17   Meredith Pel, MD  phentermine (ADIPEX-P) 37.5 MG tablet Take 37.5 mg by mouth daily. 12/16/17   [provider]  simvastatin (ZOCOR) 20 MG tablet Take 20 mg by mouth daily.     [provider]   TRULICITY 1.5 RJ/1.8AC SOPN INJECT SUBCUTANEOUSLY EVERY WEEK 01/20/18   [provider]  Vilazodone HCl (VIIBRYD) 10 MG TABS Take 10 mg by mouth daily.    [provider]    Family History Family History  Problem Relation Age of Onset  . Hypertension Father   . Renal Disease Father   . Diabetes Mother   . Hypertension Mother   . Edema Mother   . Diabetes Sister   . Diabetes Brother     Social History Social History  Tobacco Use  . Smoking status: Current Every Day Smoker    Packs/day: 0.50    Types: Cigarettes  . Smokeless tobacco: Never Used  Substance Use Topics  . Alcohol use: No  . Drug use: Yes    Types: Marijuana     Allergies   Patient has no known allergies.   Review of Systems Review of Systems  HENT: Positive for ear pain.   All other systems reviewed and are negative.    Physical Exam Updated Vital Signs BP (!) 155/92 (BP Location: Right Arm)   Pulse 100   Temp (!) 97.5 F (36.4 C) (Oral)   Resp 16   Ht 5\' 4"  (1.626 m)   Wt 93 kg   SpO2 98%   BMI 35.19 kg/m   Physical Exam Vitals signs and nursing note reviewed.  Constitutional:      Appearance: She is well-developed.  HENT:     Head: Normocephalic and atraumatic.     Right Ear: Tympanic membrane and ear canal normal.     Left Ear: Tympanic membrane and ear canal normal.     Nose: Nose normal.     Mouth/Throat:     Lips: Pink.     Mouth: Mucous membranes are moist.     Pharynx: Oropharynx is clear. Uvula midline. No pharyngeal swelling, oropharyngeal exudate, posterior oropharyngeal erythema or uvula swelling.     Comments: left upper second molar has been extracted but site appears clean, no signs of gingival swelling or abscess formation; there is tenderness along TMJ and masseter muscles bilaterally, right somewhat worse than left; pain elicited with opening/closing mouth; no malocclusion or trismus noted Eyes:     Conjunctiva/sclera: Conjunctivae normal.      Pupils: Pupils are equal, round, and reactive to light.  Neck:     Musculoskeletal: Normal range of motion.  Cardiovascular:     Rate and Rhythm: Normal rate and regular rhythm.     Heart sounds: Normal heart sounds.  Pulmonary:     Effort: Pulmonary effort is normal.     Breath sounds: Normal breath sounds.  Abdominal:     General: Bowel sounds are normal.     Palpations: Abdomen is soft.  Musculoskeletal: Normal range of motion.  Skin:    General: Skin is warm and dry.  Neurological:     Mental Status: She is alert and oriented to person, place, and time.      ED Treatments / Results  Labs (all labs ordered are listed, but only abnormal results are displayed) Labs Reviewed  GROUP A STREP BY PCR    EKG None  Radiology No results found.  Procedures Procedures (including critical care time)  Medications Ordered in ED Medications  acetaminophen (TYLENOL) tablet 650 mg (650 mg Oral Given 08/20/18 2047)  methocarbamol (ROBAXIN) tablet 500 mg (500 mg Oral Given 08/21/18 0013)     Initial Impression / Assessment and Plan / ED Course  I have reviewed the triage vital signs and the nursing notes.  Pertinent labs & imaging results that were available during my care of the patient were reviewed by me and considered in my medical decision making (see chart for details).  53 y.o. F here with bilateral ear pain and sore throat.  Seen for similar at urgent care last week and started on meloxicam without change.  She is afebrile, non-toxic.  Both TM's clear bilaterally, no signs of infection.  Oropharynx is clear without tonsillar edema or exudates.  Handling secretions well,  normal phonation without stridor.  Left upper molar has been extracted, site is clean without gingival swelling or signs of abscess formation.  There is tenderness along TMJ and masseter muscle.  Pain elicited with opening/closing mouth but no malocclusion or trismus.  Rapid strep is negative.  Feel her ear  symptoms are likely from TMJ.  She does report grinding her teeth at night a lot.  Will start on muscle relaxer, can use warm compress.  Rx viscous lidocaine for throat pain, likely viral etiology.  Recommended PCP follow-up, may need to follow-up with dentist if continued jaw pain/issues.  Return here for any new/acute changes.  Final Clinical Impressions(s) / ED Diagnoses   Final diagnoses:  Bilateral temporomandibular joint pain  Sore throat    ED Discharge Orders         Ordered    methocarbamol (ROBAXIN) 500 MG tablet  2 times daily     08/21/18 0015    lidocaine (XYLOCAINE) 2 % solution  As needed     08/21/18 0016           Larene Pickett, PA-C 08/21/18 Lamar Blinks, MD 08/23/18 1350

## 2018-08-21 DIAGNOSIS — M26623 Arthralgia of bilateral temporomandibular joint: Secondary | ICD-10-CM | POA: Diagnosis not present

## 2018-08-21 MED ORDER — METHOCARBAMOL 500 MG PO TABS: 500.0000 mg | ORAL_TABLET | Freq: Two times a day (BID) | ORAL | 0 refills | Status: DC

## 2018-08-21 MED ORDER — LIDOCAINE VISCOUS HCL 2 % MT SOLN: 15.0000 mL | OROMUCOSAL | 0 refills | Status: DC | PRN

## 2018-08-21 NOTE — Discharge Instructions (Addendum)
Take the prescribed medication as directed.  Can take tylenol or motrin with this.  Warm compresses can help too. Follow-up with your primary care doctor. You can also follow-up with dentist if you keep having issues with your jaw. Return to the ED for new or worsening symptoms.

## 2018-11-16 ENCOUNTER — Encounter (INDEPENDENT_AMBULATORY_CARE_PROVIDER_SITE_OTHER): Payer: Self-pay | Admitting: Surgery

## 2018-12-11 ENCOUNTER — Other Ambulatory Visit: Payer: Self-pay

## 2018-12-11 ENCOUNTER — Emergency Department (HOSPITAL_COMMUNITY)
Admission: EM | Admit: 2018-12-11 | Discharge: 2018-12-11 | Disposition: A | Discharge status: Home / Self Care | Payer: Medicare Other | Attending: Emergency Medicine | Admitting: Emergency Medicine

## 2018-12-11 ENCOUNTER — Encounter (HOSPITAL_COMMUNITY): Payer: Self-pay | Admitting: Emergency Medicine

## 2018-12-11 ENCOUNTER — Emergency Department (HOSPITAL_COMMUNITY): Payer: Medicare Other

## 2018-12-11 DIAGNOSIS — E119 Type 2 diabetes mellitus without complications: Secondary | ICD-10-CM | POA: Insufficient documentation

## 2018-12-11 DIAGNOSIS — Z79899 Other long term (current) drug therapy: Secondary | ICD-10-CM | POA: Insufficient documentation

## 2018-12-11 DIAGNOSIS — F17210 Nicotine dependence, cigarettes, uncomplicated: Secondary | ICD-10-CM | POA: Insufficient documentation

## 2018-12-11 DIAGNOSIS — R197 Diarrhea, unspecified: Secondary | ICD-10-CM | POA: Insufficient documentation

## 2018-12-11 DIAGNOSIS — R1084 Generalized abdominal pain: Secondary | ICD-10-CM | POA: Diagnosis present

## 2018-12-11 DIAGNOSIS — I10 Essential (primary) hypertension: Secondary | ICD-10-CM | POA: Diagnosis not present

## 2018-12-11 DIAGNOSIS — Z794 Long term (current) use of insulin: Secondary | ICD-10-CM | POA: Diagnosis not present

## 2018-12-11 DIAGNOSIS — R112 Nausea with vomiting, unspecified: Secondary | ICD-10-CM | POA: Insufficient documentation

## 2018-12-11 LAB — URINALYSIS, ROUTINE W REFLEX MICROSCOPIC
Bacteria, UA: NONE SEEN
Bilirubin Urine: NEGATIVE
Glucose, UA: 50 mg/dL — AB
Ketones, ur: NEGATIVE mg/dL
Leukocytes,Ua: NEGATIVE
Nitrite: NEGATIVE
Protein, ur: NEGATIVE mg/dL
Specific Gravity, Urine: >1.046 — ABNORMAL HIGH (ref 1.005–1.030)
pH: 5 (ref 5.0–8.0)

## 2018-12-11 LAB — CBC WITH DIFFERENTIAL/PLATELET
Abs Immature Granulocytes: 0.03 10*3/uL (ref 0.00–0.07)
Basophils Absolute: 0 10*3/uL (ref 0.0–0.1)
Basophils Relative: 0 %
Eosinophils Absolute: 0 10*3/uL (ref 0.0–0.5)
Eosinophils Relative: 0 %
HCT: 45 % (ref 36.0–46.0)
Hemoglobin: 15 g/dL (ref 12.0–15.0)
Immature Granulocytes: 0 %
Lymphocytes Relative: 29 %
Lymphs Abs: 3.3 10*3/uL (ref 0.7–4.0)
MCH: 28.1 pg (ref 26.0–34.0)
MCHC: 33.3 g/dL (ref 30.0–36.0)
MCV: 84.3 fL (ref 80.0–100.0)
Monocytes Absolute: 0.7 10*3/uL (ref 0.1–1.0)
Monocytes Relative: 7 %
Neutro Abs: 7.1 10*3/uL (ref 1.7–7.7)
Neutrophils Relative %: 64 %
Platelets: 337 10*3/uL (ref 150–400)
RBC: 5.34 MIL/uL — ABNORMAL HIGH (ref 3.87–5.11)
RDW: 13.2 % (ref 11.5–15.5)
WBC: 11.2 10*3/uL — ABNORMAL HIGH (ref 4.0–10.5)
nRBC: 0 % (ref 0.0–0.2)

## 2018-12-11 LAB — LIPASE, BLOOD: Lipase: 42 U/L (ref 11–51)

## 2018-12-11 LAB — COMPREHENSIVE METABOLIC PANEL
ALT: 20 U/L (ref 0–44)
AST: 20 U/L (ref 15–41)
Albumin: 4.2 g/dL (ref 3.5–5.0)
Alkaline Phosphatase: 98 U/L (ref 38–126)
Anion gap: 14 (ref 5–15)
BUN: 9 mg/dL (ref 6–20)
CO2: 23 mmol/L (ref 22–32)
Calcium: 9.4 mg/dL (ref 8.9–10.3)
Chloride: 102 mmol/L (ref 98–111)
Creatinine, Ser: 0.74 mg/dL (ref 0.44–1.00)
GFR calc Af Amer: >60 mL/min (ref >60)
GFR calc non Af Amer: >60 mL/min (ref >60)
Glucose, Bld: 256 mg/dL — ABNORMAL HIGH (ref 70–99)
Potassium: 3.8 mmol/L (ref 3.5–5.1)
Sodium: 139 mmol/L (ref 135–145)
Total Bilirubin: 0.9 mg/dL (ref 0.3–1.2)
Total Protein: 7.4 g/dL (ref 6.5–8.1)

## 2018-12-11 MED ORDER — LOPERAMIDE HCL 2 MG PO CAPS: 2.0000 mg | ORAL_CAPSULE | Freq: Four times a day (QID) | ORAL | 0 refills | Status: DC | PRN

## 2018-12-11 MED ORDER — HYDROMORPHONE HCL 1 MG/ML IJ SOLN
1.0000 mg | Freq: Once | INTRAMUSCULAR | Status: AC
  Administered 2018-12-11: 1 mg via INTRAVENOUS
  Filled 2018-12-11: qty 1

## 2018-12-11 MED ORDER — IOHEXOL 300 MG/ML  SOLN
100.0000 mL | Freq: Once | INTRAMUSCULAR | Status: AC | PRN
  Administered 2018-12-11: 100 mL via INTRAVENOUS

## 2018-12-11 MED ORDER — ONDANSETRON 4 MG PO TBDP: 4.0000 mg | ORAL_TABLET | Freq: Four times a day (QID) | ORAL | 0 refills | Status: DC | PRN

## 2018-12-11 MED ORDER — ONDANSETRON HCL 4 MG/2ML IJ SOLN
4.0000 mg | Freq: Once | INTRAMUSCULAR | Status: AC
  Administered 2018-12-11: 4 mg via INTRAVENOUS
  Filled 2018-12-11: qty 2

## 2018-12-11 MED ORDER — SODIUM CHLORIDE 0.9 % IV BOLUS (SEPSIS)
1000.0000 mL | Freq: Once | INTRAVENOUS | Status: AC
  Administered 2018-12-11: 1000 mL via INTRAVENOUS

## 2018-12-11 MED ORDER — DICYCLOMINE HCL 20 MG PO TABS: 20.0000 mg | ORAL_TABLET | Freq: Three times a day (TID) | ORAL | 0 refills | Status: DC | PRN

## 2018-12-11 NOTE — ED Notes (Signed)
Patient transported to CT 

## 2018-12-11 NOTE — ED Notes (Signed)
ED Provider at bedside. 

## 2018-12-11 NOTE — ED Notes (Signed)
Patient verbalizes understanding of discharge instructions. Opportunity for questioning and answers were provided. Pt discharged from ED. 

## 2018-12-11 NOTE — ED Triage Notes (Signed)
Pt reports gen abd pain, N/V/D X few days. States hx of pancreatitis.

## 2018-12-11 NOTE — ED Provider Notes (Signed)
TIME SEEN: 3:51 AM  CHIEF COMPLAINT: Abdominal pain, vomiting  HPI: Patient is a 53 year old female with history of previous pancreatitis, diabetes, hyperlipidemia, cholecystectomy, hysterectomy, previous C-section who presents to the emergency department with diffuse abdominal pain, nausea and vomiting.  States she has been dealing with the symptoms for 5 days.  States over the past days she has been vomiting more frequently.  Symptoms uncontrolled with promethazine at home.  She states she has had low-grade temperatures but nothing over 100.  Denies cough, chest pain or shortness of breath.  States she also has had diarrhea.  No dysuria or hematuria.  No sick contacts.  Has been self isolating.  States symptoms feel similar to her previous episodes of pancreatitis.  She does not drink alcohol.  ROS: See HPI Constitutional: no fever  Eyes: no drainage  ENT: no runny nose   Cardiovascular:  no chest pain  Resp: no SOB  GI: no vomiting GU: no dysuria Integumentary: no rash  Allergy: no hives  Musculoskeletal: no leg swelling  Neurological: no slurred speech ROS otherwise negative  PAST MEDICAL HISTORY/PAST SURGICAL HISTORY:  Past Medical History:  Diagnosis Date  . Anxiety   . Arthritis    rheumatoid , osteoarthritis  . Bronchitis   . Chronic back pain   . Depression   . Diabetes mellitus   . Fatty liver   . Fibromyalgia   . GERD (gastroesophageal reflux disease)   . Headache   . Hypertension   . Pancreatitis   . Pneumonia   . Rotator cuff tear, right     MEDICATIONS:  Prior to Admission medications   Medication Sig Start Date End Date Taking? Authorizing Provider  albuterol (PROVENTIL HFA;VENTOLIN HFA) 108 (90 BASE) MCG/ACT inhaler Inhale 2 puffs into the lungs every 4 (four) hours as needed for wheezing.    [provider]  albuterol (PROVENTIL) (2.5 MG/3ML) 0.083% nebulizer solution Take 2.5 mg by nebulization every 6 (six) hours as needed for wheezing or  shortness of breath.    [provider]  ALPRAZolam Duanne Moron) 0.5 MG tablet Take 0.5 mg by mouth daily as needed for anxiety.     [provider]  amitriptyline (ELAVIL) 10 MG tablet TAKE 1 TABLET(10 MG) BY MOUTH AT BEDTIME 08/07/18   Dwana Melena L, PA-C  AZOR 5-40 MG per tablet Take 1 tablet by mouth daily.  02/06/14   [provider]  baclofen (LIORESAL) 10 MG tablet 1 po tid prn 11/13/17   Meredith Pel, MD  cyclobenzaprine (FLEXERIL) 10 MG tablet TAKE 1 TABLET BY MOUTH THREE TIMES DAILY( EVERY EIGHT HOURS) FOR SPASMS 12/18/17   Marlou Sa Tonna Corner, MD  diazepam (VALIUM) 5 MG tablet Take one tablet po at the MRI scanner, may repeat x 1. 03/30/18   Jessy Oto, MD  diclofenac sodium (VOLTAREN) 1 % GEL Apply 2 g topically 4 (four) times daily. 04/22/18   Jessy Oto, MD  fluticasone (FLONASE) 50 MCG/ACT nasal spray Place 2 sprays into both nostrils daily as needed for allergies or rhinitis.     [provider]  gabapentin (NEURONTIN) 100 MG capsule Take 1 capsule (100 mg total) by mouth at bedtime. 07/23/17   Jessy Oto, MD  Hypromellose (ARTIFICIAL TEARS OP) Apply 1-2 drops to eye daily as needed (for dry eyes).     [provider]  insulin glargine (LANTUS) 100 unit/mL SOPN Inject 10 Units into the skin at bedtime.    [provider]  lidocaine (  XYLOCAINE) 2 % solution Use as directed 15 mLs in the mouth or throat as needed for mouth pain. 08/21/18   Larene Pickett, PA-C  Menthol-Methyl Salicylate (MUSCLE RUB EX) Apply 1 application topically daily as needed (for pain).     [provider]  metFORMIN (GLUCOPHAGE) 1000 MG tablet Take 1,000 mg by mouth 2 (two) times daily. 07/29/17   [provider]  methocarbamol (ROBAXIN) 500 MG tablet Take 1 tablet (500 mg total) by mouth 2 (two) times daily. 08/21/18   Larene Pickett, PA-C  metoprolol succinate (TOPROL-XL) 50 MG 24 hr tablet Take 50 mg by mouth daily. 07/21/14    [provider]  naproxen (NAPROSYN) 500 MG tablet Take 1 tablet (500 mg total) by mouth 2 (two) times daily with a meal. 04/22/18   Jessy Oto, MD  oxycodone (OXY-IR) 5 MG capsule 1 to 2 po q 4hrs prn pnain 11/13/17   Meredith Pel, MD  oxyCODONE (ROXICODONE) 5 MG immediate release tablet 1-2 po q 6 hrs prn pain 12/11/17   Meredith Pel, MD  phentermine (ADIPEX-P) 37.5 MG tablet Take 37.5 mg by mouth daily. 12/16/17   [provider]  simvastatin (ZOCOR) 20 MG tablet Take 20 mg by mouth daily.     [provider]  TRULICITY 1.5 DJ/4.9FW SOPN INJECT SUBCUTANEOUSLY EVERY WEEK 01/20/18   [provider]  Vilazodone HCl (VIIBRYD) 10 MG TABS Take 10 mg by mouth daily.    [provider]    ALLERGIES:  No Known Allergies  SOCIAL HISTORY:  Social History   Tobacco Use  . Smoking status: Current Every Day Smoker    Packs/day: 0.50    Types: Cigarettes  . Smokeless tobacco: Never Used  Substance Use Topics  . Alcohol use: No    FAMILY HISTORY: Family History  Problem Relation Age of Onset  . Hypertension Father   . Renal Disease Father   . Diabetes Mother   . Hypertension Mother   . Edema Mother   . Diabetes Sister   . Diabetes Brother     EXAM: BP (!) 145/83 (BP Location: Right Arm)   Pulse (!) 111   Temp 98.5 F (36.9 C) (Oral)   Resp 18   Ht 5\' 5"  (1.651 m)   Wt 104.3 kg   SpO2 95%   BMI 38.27 kg/m  CONSTITUTIONAL: Alert and oriented and responds appropriately to questions.  These, appears uncomfortable but not in distress HEAD: Normocephalic EYES: Conjunctivae clear, pupils appear equal, EOMI ENT: normal nose; dry mucous membranes NECK: Supple, no meningismus, no nuchal rigidity, no LAD  CARD: Regular and tachycardic; S1 and S2 appreciated; no murmurs, no clicks, no rubs, no gallops RESP: Normal chest excursion without splinting or tachypnea; breath sounds clear and equal bilaterally; no wheezes, no rhonchi, no  rales, no hypoxia or respiratory distress, speaking full sentences ABD/GI: Normal bowel sounds; non-distended; soft, easily tender throughout the abdomen especially in the right lower quadrant and in the epigastrium, no rebound, no guarding, no peritoneal signs, no hepatosplenomegaly BACK:  The back appears normal and is non-tender to palpation, there is no CVA tenderness EXT: Normal ROM in all joints; non-tender to palpation; no edema; normal capillary refill; no cyanosis, no calf tenderness or swelling    SKIN: Normal color for age and race; warm; no rash NEURO: Moves all extremities equally PSYCH: The patient's mood and manner are appropriate. Grooming and personal hygiene are appropriate.  MEDICAL DECISION MAKING: Patient here  with abdominal pain.  States this feels like her pancreatitis.  She has been vomiting consistently.  She does have dry mucous membranes on exam and is tachycardic.  Diffusely tender on examination.  Differential includes pancreatitis, colitis, bowel obstruction, appendicitis, gastroenteritis.  Will obtain labs, urine and a CT of the abdomen and pelvis.  Will give IV fluids, pain and nausea medicine.  ED PROGRESS: Patient's work-up has been unremarkable.  Labs, urine showed no significant abnormality.  LFTs, lipase normal.  CT scan shows normal-appearing pancreas and normal appendix.  No bowel obstruction.  No acute abnormality.  Suspect patient is having viral gastroenteritis causing her symptoms.  Will discharge with Bentyl, Zofran, Imodium.  She is comfortable with this plan.  Recommended bland diet and increase fluid intake.  Discussed return precautions.   At this time, I do not feel there is any life-threatening condition present. I have reviewed and discussed all results (EKG, imaging, lab, urine as appropriate) and exam findings with patient/family. I have reviewed nursing notes and appropriate previous records.  I feel the patient is safe to be discharged home without  further emergent workup and can continue workup as an outpatient as needed. Discussed usual and customary return precautions. Patient/family verbalize understanding and are comfortable with this plan.  Outpatient follow-up has been provided as needed. All questions have been answered.     Tay Whitwell, Delice Bison, DO 12/11/18 [phone]

## 2018-12-12 ENCOUNTER — Other Ambulatory Visit: Payer: Self-pay

## 2018-12-12 ENCOUNTER — Encounter (HOSPITAL_COMMUNITY): Payer: Self-pay | Admitting: Emergency Medicine

## 2018-12-12 ENCOUNTER — Emergency Department (HOSPITAL_COMMUNITY)
Admission: EM | Admit: 2018-12-12 | Discharge: 2018-12-13 | Disposition: A | Discharge status: Home / Self Care | Payer: Medicare Other | Attending: Emergency Medicine | Admitting: Emergency Medicine

## 2018-12-12 DIAGNOSIS — Z9049 Acquired absence of other specified parts of digestive tract: Secondary | ICD-10-CM | POA: Insufficient documentation

## 2018-12-12 DIAGNOSIS — R1084 Generalized abdominal pain: Secondary | ICD-10-CM

## 2018-12-12 DIAGNOSIS — F419 Anxiety disorder, unspecified: Secondary | ICD-10-CM | POA: Insufficient documentation

## 2018-12-12 DIAGNOSIS — F329 Major depressive disorder, single episode, unspecified: Secondary | ICD-10-CM | POA: Diagnosis not present

## 2018-12-12 DIAGNOSIS — I10 Essential (primary) hypertension: Secondary | ICD-10-CM | POA: Insufficient documentation

## 2018-12-12 DIAGNOSIS — Z79899 Other long term (current) drug therapy: Secondary | ICD-10-CM | POA: Insufficient documentation

## 2018-12-12 DIAGNOSIS — Z794 Long term (current) use of insulin: Secondary | ICD-10-CM | POA: Diagnosis not present

## 2018-12-12 DIAGNOSIS — R1013 Epigastric pain: Secondary | ICD-10-CM | POA: Diagnosis present

## 2018-12-12 DIAGNOSIS — E119 Type 2 diabetes mellitus without complications: Secondary | ICD-10-CM | POA: Diagnosis not present

## 2018-12-12 DIAGNOSIS — F17210 Nicotine dependence, cigarettes, uncomplicated: Secondary | ICD-10-CM | POA: Insufficient documentation

## 2018-12-12 LAB — URINALYSIS, ROUTINE W REFLEX MICROSCOPIC
Bacteria, UA: NONE SEEN
Bilirubin Urine: NEGATIVE
Glucose, UA: 50 mg/dL — AB
Ketones, ur: 80 mg/dL — AB
Leukocytes,Ua: NEGATIVE
Nitrite: NEGATIVE
Protein, ur: 30 mg/dL — AB
Specific Gravity, Urine: 1.021 (ref 1.005–1.030)
pH: 5 (ref 5.0–8.0)

## 2018-12-12 LAB — CBC
HCT: 45.4 % (ref 36.0–46.0)
Hemoglobin: 15.4 g/dL — ABNORMAL HIGH (ref 12.0–15.0)
MCH: 28.2 pg (ref 26.0–34.0)
MCHC: 33.9 g/dL (ref 30.0–36.0)
MCV: 83 fL (ref 80.0–100.0)
Platelets: 349 10*3/uL (ref 150–400)
RBC: 5.47 MIL/uL — ABNORMAL HIGH (ref 3.87–5.11)
RDW: 12.8 % (ref 11.5–15.5)
WBC: 12.1 10*3/uL — ABNORMAL HIGH (ref 4.0–10.5)
nRBC: 0 % (ref 0.0–0.2)

## 2018-12-12 LAB — COMPREHENSIVE METABOLIC PANEL
ALT: 20 U/L (ref 0–44)
AST: 18 U/L (ref 15–41)
Albumin: 4.4 g/dL (ref 3.5–5.0)
Alkaline Phosphatase: 97 U/L (ref 38–126)
Anion gap: 10 (ref 5–15)
BUN: 6 mg/dL (ref 6–20)
CO2: 27 mmol/L (ref 22–32)
Calcium: 9.3 mg/dL (ref 8.9–10.3)
Chloride: 95 mmol/L — ABNORMAL LOW (ref 98–111)
Creatinine, Ser: 0.66 mg/dL (ref 0.44–1.00)
GFR calc Af Amer: >60 mL/min (ref >60)
GFR calc non Af Amer: >60 mL/min (ref >60)
Glucose, Bld: 203 mg/dL — ABNORMAL HIGH (ref 70–99)
Potassium: 4 mmol/L (ref 3.5–5.1)
Sodium: 132 mmol/L — ABNORMAL LOW (ref 135–145)
Total Bilirubin: 0.7 mg/dL (ref 0.3–1.2)
Total Protein: 7.6 g/dL (ref 6.5–8.1)

## 2018-12-12 LAB — I-STAT BETA HCG BLOOD, ED (MC, WL, AP ONLY): I-stat hCG, quantitative: <5 m[IU]/mL (ref <5)

## 2018-12-12 LAB — LIPASE, BLOOD: Lipase: 33 U/L (ref 11–51)

## 2018-12-12 MED ORDER — SODIUM CHLORIDE 0.9% FLUSH
3.0000 mL | Freq: Once | INTRAVENOUS | Status: AC
  Administered 2018-12-13: 3 mL via INTRAVENOUS

## 2018-12-12 NOTE — ED Triage Notes (Signed)
Reports being seen last night for the same.  Epigastric pain with n/v.  Reports no relief of symptoms.  Taking zofran and bentyl at home with no relief.

## 2018-12-13 DIAGNOSIS — R1084 Generalized abdominal pain: Secondary | ICD-10-CM | POA: Diagnosis not present

## 2018-12-13 MED ORDER — METOCLOPRAMIDE HCL 5 MG/ML IJ SOLN
10.0000 mg | INTRAMUSCULAR | Status: AC
  Administered 2018-12-13: 10 mg via INTRAVENOUS
  Filled 2018-12-13: qty 2

## 2018-12-13 MED ORDER — METOCLOPRAMIDE HCL 10 MG PO TABS: 10.0000 mg | ORAL_TABLET | Freq: Four times a day (QID) | ORAL | 0 refills | Status: DC | PRN

## 2018-12-13 MED ORDER — SODIUM CHLORIDE 0.9 % IV BOLUS
2000.0000 mL | Freq: Once | INTRAVENOUS | Status: AC
  Administered 2018-12-13: 1000 mL via INTRAVENOUS

## 2018-12-13 MED ORDER — ALUM & MAG HYDROXIDE-SIMETH 200-200-20 MG/5ML PO SUSP
30.0000 mL | Freq: Once | ORAL | Status: AC
  Administered 2018-12-13: 30 mL via ORAL
  Filled 2018-12-13: qty 30

## 2018-12-13 MED ORDER — PANTOPRAZOLE SODIUM 20 MG PO TBEC: 20.0000 mg | DELAYED_RELEASE_TABLET | Freq: Every day | ORAL | 1 refills | Status: DC

## 2018-12-13 MED ORDER — KETOROLAC TROMETHAMINE 30 MG/ML IJ SOLN
30.0000 mg | Freq: Once | INTRAMUSCULAR | Status: AC
  Administered 2018-12-13: 30 mg via INTRAVENOUS
  Filled 2018-12-13: qty 1

## 2018-12-13 MED ORDER — PROMETHAZINE HCL 25 MG/ML IJ SOLN
12.5000 mg | Freq: Once | INTRAMUSCULAR | Status: AC
  Administered 2018-12-13: 12.5 mg via INTRAVENOUS
  Filled 2018-12-13: qty 1

## 2018-12-13 MED ORDER — LIDOCAINE VISCOUS HCL 2 % MT SOLN
15.0000 mL | Freq: Once | OROMUCOSAL | Status: AC
  Administered 2018-12-13: 15 mL via ORAL
  Filled 2018-12-13: qty 15

## 2018-12-13 MED ORDER — HYDROCODONE-ACETAMINOPHEN 5-325 MG PO TABS: 1.0000 | ORAL_TABLET | Freq: Four times a day (QID) | ORAL | 0 refills | Status: DC | PRN

## 2018-12-13 MED ORDER — PANTOPRAZOLE SODIUM 40 MG IV SOLR
40.0000 mg | Freq: Once | INTRAVENOUS | Status: AC
  Administered 2018-12-13: 40 mg via INTRAVENOUS
  Filled 2018-12-13: qty 40

## 2018-12-13 NOTE — Discharge Instructions (Addendum)
Start a clear liquid diet and continue until symptoms have improved. Avoid fried foods, fatty foods, greasy foods, and milk products until symptoms resolve. We recommend the use of Reglan as prescribed for nausea/vomiting. DISCONTINUE THE ZOFRAN YOU WERE PRESCRIBED YESTERDAY. Begin taking Protonix daily. You may continue using Bentyl for abdominal pain or cramping. Take Norco for severe pain, as needed.  Do not drive or drink alcohol after taking this medication as it may make you drowsy and impair your judgment.  Follow-up with your primary care doctor to ensure resolution of symptoms.

## 2018-12-13 NOTE — ED Provider Notes (Signed)
[facility] EMERGENCY DEPARTMENT Provider Note   CSN: [id number] Arrival date & time: 12/12/18  2041    History   Chief Complaint Chief Complaint  Patient presents with   Abdominal Pain    HPI [name] is a 53 y.o. female.     53 year old female with history of previous pancreatitis, diabetes, hyperlipidemia, cholecystectomy, hysterectomy, previous C-section who presents to the emergency department for evaluation of abdominal pain.  She describes a sharp pain in her epigastrium with associated anorexia.  Eating and drinking worsens her pain and causes associated nausea as well as vomiting and dry heaves.  Was seen for similar symptoms yesterday.  Had reassuring labs and negative CT scan.  She has taken the Zofran and Bentyl prescribed to her without relief.  No fevers, urinary symptoms, bowel changes.  The history is provided by the patient. No language interpreter was used.  Abdominal Pain    Past Medical History:  Diagnosis Date   Anxiety    Arthritis    rheumatoid , osteoarthritis   Bronchitis    Chronic back pain    Depression    Diabetes mellitus    Fatty liver    Fibromyalgia    GERD (gastroesophageal reflux disease)    Headache    Hypertension    Pancreatitis    Pneumonia    Rotator cuff tear, right     Patient Active Problem List   Diagnosis Date Noted   Complete tear of right rotator cuff 07/22/2017    Class: Chronic   Retained orthopedic hardware 07/22/2017    Class: Chronic   Cervical disc herniation 07/21/2017    Class: Acute   Status post cervical spinal fusion 07/21/2017   Chronic back pain     Past Surgical History:  Procedure Laterality Date   ABDOMINAL HYSTERECTOMY     ANTERIOR CERVICAL DECOMP/DISCECTOMY FUSION N/A 07/21/2017   Procedure: ANTERIOR CERVICAL DISCECTOMY AND FUSION C6-7, REMOVAL OF SCREW C6 PLATE, DEPUY ZERO-P IMPLANT, LOCAL BONE GRAFT, ALLOGRAFT BONE GRAFT, VIVIGEN;  Surgeon: Jessy Oto, MD;  Location: [place];  Service: Orthopedics;  Laterality: N/A;   BACK SURGERY     laminectomy   BONE GRAFT HIP ILIAC CREST     BREAST BIOPSY     CARPAL TUNNEL RELEASE     CERVICAL DISCECTOMY  07/21/2017   CESAREAN SECTION     CHOLECYSTECTOMY     COLONOSCOPY     frontal fusion     neck fusion     patient reported   SHOULDER ARTHROSCOPY WITH SUBACROMIAL DECOMPRESSION, ROTATOR CUFF REPAIR AND BICEP TENDON REPAIR Right 10/31/2017   Procedure: RIGHT SHOULDER ARTHROSCOPY, MANIPULATION UNDER ANESTHESIA, BICEPS TENODESIS, AND MINI OPEN ROTATOR CUFF TEAR REPAIR;  Surgeon: Meredith Pel, MD;  Location: [place];  Service: Orthopedics;  Laterality: Right;     OB History   No obstetric history on file.      Home Medications    Prior to Admission medications   Medication Sig Start Date End Date Taking? Authorizing Provider  albuterol (PROVENTIL HFA;VENTOLIN HFA) 108 (90 BASE) MCG/ACT inhaler Inhale 2 puffs into the lungs every 4 (four) hours as needed for wheezing.    [provider]  albuterol (PROVENTIL) (2.5 MG/3ML) 0.083% nebulizer solution Take 2.5 mg by nebulization every 6 (six) hours as needed for wheezing or shortness of breath.    [provider]  ALPRAZolam Duanne Moron) 0.5 MG tablet Take 0.5 mg by mouth daily as needed for anxiety.  [provider]  amitriptyline (ELAVIL) 10 MG tablet TAKE 1 TABLET(10 MG) BY MOUTH AT BEDTIME Patient taking differently: Take 10 mg by mouth at bedtime.  08/07/18   Aundra Dubin, PA-C  AZOR 5-40 MG per tablet Take 1 tablet by mouth daily.  02/06/14   [provider]  baclofen (LIORESAL) 10 MG tablet 1 po tid prn Patient taking differently: Take 10 mg by mouth 3 (three) times daily as needed for muscle spasms.  11/13/17   Meredith Pel, MD  cyclobenzaprine (FLEXERIL) 10 MG tablet TAKE 1 TABLET BY MOUTH THREE TIMES DAILY( EVERY EIGHT HOURS) FOR SPASMS Patient not taking: Reported on 12/11/2018  12/18/17   Meredith Pel, MD  diazepam (VALIUM) 5 MG tablet Take one tablet po at the MRI scanner, may repeat x 1. Patient not taking: Reported on 12/11/2018 03/30/18   Jessy Oto, MD  diclofenac sodium (VOLTAREN) 1 % GEL Apply 2 g topically 4 (four) times daily. 04/22/18   Jessy Oto, MD  dicyclomine (BENTYL) 20 MG tablet Take 1 tablet (20 mg total) by mouth every 8 (eight) hours as needed for spasms (Abdominal cramping). 12/11/18   Ward, Delice Bison, DO  fluticasone (FLONASE) 50 MCG/ACT nasal spray Place 2 sprays into both nostrils daily as needed for allergies or rhinitis.     [provider]  gabapentin (NEURONTIN) 100 MG capsule Take 1 capsule (100 mg total) by mouth at bedtime. 07/23/17   Jessy Oto, MD  HYDROcodone-acetaminophen (NORCO/VICODIN) 5-325 MG tablet Take 1 tablet by mouth every 6 (six) hours as needed for severe pain. 12/13/18   Antonietta Breach, PA-C  Hypromellose (ARTIFICIAL TEARS OP) Apply 1-2 drops to eye daily as needed (for dry eyes).     [provider]  ibuprofen (ADVIL) 800 MG tablet Take 800 mg by mouth 2 (two) times daily as needed for mild pain.  11/25/18   [provider]  insulin glargine (LANTUS) 100 unit/mL SOPN Inject 40 Units into the skin at bedtime.     [provider]  lidocaine (XYLOCAINE) 2 % solution Use as directed 15 mLs in the mouth or throat as needed for mouth pain. Patient not taking: Reported on 12/11/2018 08/21/18   Larene Pickett, PA-C  loperamide (IMODIUM) 2 MG capsule Take 1 capsule (2 mg total) by mouth 4 (four) times daily as needed for diarrhea or loose stools. 12/11/18   Ward, Delice Bison, DO  Menthol-Methyl Salicylate (MUSCLE RUB EX) Apply 1 application topically daily as needed (for pain).     [provider]  metFORMIN (GLUCOPHAGE) 1000 MG tablet Take 1,000 mg by mouth 2 (two) times daily. 07/29/17   [provider]  methocarbamol (ROBAXIN) 500 MG tablet Take 1 tablet (500 mg total) by mouth 2  (two) times daily. Patient not taking: Reported on 12/11/2018 08/21/18   Larene Pickett, PA-C  metoCLOPramide (REGLAN) 10 MG tablet Take 1 tablet (10 mg total) by mouth every 6 (six) hours as needed for nausea or vomiting. 12/13/18   Antonietta Breach, PA-C  metoprolol succinate (TOPROL-XL) 50 MG 24 hr tablet Take 50 mg by mouth daily. 07/21/14   [provider]  naproxen (NAPROSYN) 500 MG tablet Take 1 tablet (500 mg total) by mouth 2 (two) times daily with a meal. Patient not taking: Reported on 12/11/2018 04/22/18   Jessy Oto, MD  oxycodone (OXY-IR) 5 MG capsule 1 to 2 po q 4hrs prn pnain Patient not taking: Reported on 12/11/2018  11/13/17   Meredith Pel, MD  oxyCODONE (ROXICODONE) 5 MG immediate release tablet 1-2 po q 6 hrs prn pain Patient not taking: Reported on 12/11/2018 12/11/17   Meredith Pel, MD  pantoprazole (PROTONIX) 20 MG tablet Take 1 tablet (20 mg total) by mouth daily. 12/13/18   Antonietta Breach, PA-C  phentermine (ADIPEX-P) 37.5 MG tablet Take 37.5 mg by mouth daily. 12/16/17   [provider]  simvastatin (ZOCOR) 20 MG tablet Take 20 mg by mouth daily.     [provider]  Vilazodone HCl (VIIBRYD) 10 MG TABS Take 10 mg by mouth daily.    [provider]    Family History Family History  Problem Relation Age of Onset   Hypertension Father    Renal Disease Father    Diabetes Mother    Hypertension Mother    Edema Mother    Diabetes Sister    Diabetes Brother     Social History Social History   Tobacco Use   Smoking status: Current Every Day Smoker    Packs/day: 0.50    Types: Cigarettes   Smokeless tobacco: Never Used  Substance Use Topics   Alcohol use: No   Drug use: Yes    Types: Marijuana     Allergies   Patient has no known allergies.   Review of Systems Review of Systems  Gastrointestinal: Positive for abdominal pain.  Ten systems reviewed and are negative for acute change, except as noted in the HPI.     Physical Exam Updated Vital Signs BP 135/81    Pulse 81    Temp 98.4 F (36.9 C) (Oral)    Resp 18    Ht 5\' 5"  (1.651 m)    Wt 104.3 kg    SpO2 96%    BMI 38.26 kg/m   Physical Exam Vitals signs and nursing note reviewed.  Constitutional:      General: She is not in acute distress.    Appearance: She is well-developed. She is not diaphoretic.     Comments: Patient appears uncomfortable, but nontoxic.  She is in no distress.  HENT:     Head: Normocephalic and atraumatic.  Eyes:     General: No scleral icterus.    Conjunctiva/sclera: Conjunctivae normal.  Neck:     Musculoskeletal: Normal range of motion.  Cardiovascular:     Rate and Rhythm: Normal rate and regular rhythm.     Pulses: Normal pulses.  Pulmonary:     Effort: Pulmonary effort is normal. No respiratory distress.     Comments: Respirations even and unlabored Abdominal:     Palpations: Abdomen is soft. There is no mass.     Tenderness: There is abdominal tenderness.     Comments: Abdomen soft, obese.  Abdomen generally tender.  There is subjective focal epigastric and left upper quadrant tenderness.  No peritoneal signs.  Musculoskeletal: Normal range of motion.  Skin:    General: Skin is warm and dry.     Coloration: Skin is not pale.     Findings: No erythema or rash.  Neurological:     Mental Status: She is alert and oriented to person, place, and time.     Coordination: Coordination normal.     Comments: Moving all extremities spontaneously.  Psychiatric:        Behavior: Behavior normal.      ED Treatments / Results  Labs (all labs ordered are listed, but only abnormal results are displayed) Labs Reviewed  COMPREHENSIVE  METABOLIC PANEL - Abnormal; Notable for the following components:      Result Value   Sodium 132 (*)    Chloride 95 (*)    Glucose, Bld 203 (*)    All other components within normal limits  CBC - Abnormal; Notable for the following components:   WBC 12.1 (*)    RBC 5.47 (*)     Hemoglobin 15.4 (*)    All other components within normal limits  URINALYSIS, ROUTINE W REFLEX MICROSCOPIC - Abnormal; Notable for the following components:   APPearance HAZY (*)    Glucose, UA 50 (*)    Hgb urine dipstick MODERATE (*)    Ketones, ur 80 (*)    Protein, ur 30 (*)    All other components within normal limits  LIPASE, BLOOD  I-STAT BETA HCG BLOOD, ED ([facility], [facility], [facility] ONLY)    EKG EKG Interpretation  Date/Time:  Saturday Dec 12 2018 20:58:02 EDT Ventricular Rate:  85 PR Interval:  170 QRS Duration: 84 QT Interval:  400 QTC Calculation: 476 R Axis:   4 Text Interpretation:  Normal sinus rhythm Septal infarct , age undetermined Abnormal ECG When compared with ECG of 10/31/2017, No significant change was found Confirmed by Delora Fuel ([id number]) on 12/13/2018 12:05:53 AM   Radiology Ct Abdomen Pelvis W Contrast  Result Date: 12/11/2018 CLINICAL DATA:  Generalized abdominal pain with nausea, vomiting, and diarrhea for 3 days. EXAM: CT ABDOMEN AND PELVIS WITH CONTRAST TECHNIQUE: Multidetector CT imaging of the abdomen and pelvis was performed using the standard protocol following bolus administration of intravenous contrast. CONTRAST:  110mL OMNIPAQUE IOHEXOL 300 MG/ML  SOLN COMPARISON:  06/15/2015 FINDINGS: Lower chest:  No contributory findings. Hepatobiliary: No focal liver abnormality.Borderline hepatic steatosis. Cholecystectomy. Pancreas: Unremarkable. Spleen: Unremarkable. Adrenals/Urinary Tract: Stable 13 mm left adrenal nodule most consistent with adenoma. No hydronephrosis or stone. Unremarkable bladder. Stomach/Bowel:  No obstruction. No appendicitis. Vascular/Lymphatic: No acute vascular abnormality. No mass or adenopathy. Reproductive:Hysterectomy Other: No ascites or pneumoperitoneum. Musculoskeletal: Changes of L4-5 and L5-S1 ALIF. There is adjacent segment disc narrowing and facet osteoarthritis. IMPRESSION: No acute finding or change from 2016. Electronically Signed    By: Monte Fantasia M.D.   On: 12/11/2018 06:16    Procedures Procedures (including critical care time)  Medications Ordered in ED Medications  sodium chloride flush (NS) 0.9 % injection 3 mL (3 mLs Intravenous Given 12/13/18 0259)  sodium chloride 0.9 % bolus 2,000 mL (1,000 mLs Intravenous Bolus from Bag 12/13/18 0048)  pantoprazole (PROTONIX) injection 40 mg (40 mg Intravenous Given 12/13/18 0048)  metoCLOPramide (REGLAN) injection 10 mg (10 mg Intravenous Given 12/13/18 0049)  ketorolac (TORADOL) 30 MG/ML injection 30 mg (30 mg Intravenous Given 12/13/18 0048)  alum & mag hydroxide-simeth (MAALOX/MYLANTA) 200-200-20 MG/5ML suspension 30 mL (30 mLs Oral Given 12/13/18 0050)    And  lidocaine (XYLOCAINE) 2 % viscous mouth solution 15 mL (15 mLs Oral Given 12/13/18 0050)  promethazine (PHENERGAN) injection 12.5 mg (12.5 mg Intravenous Given 12/13/18 0258)     Initial Impression / Assessment and Plan / ED Course  I have reviewed the triage vital signs and the nursing notes.  Pertinent labs & imaging results that were available during my care of the patient were reviewed by me and considered in my medical decision making (see chart for details).        10:81 AM 53 year old female presents to the emergency department for persistent abdominal pain with nausea and vomiting.  She was seen yesterday  for similar complaints.  Underwent labs as well as a CT scan which was reassuring.  Patient reports that pain feels similar to past episodes of pancreatitis, but her lipase remains normal and imaging from yesterday did not show any changes about the pancreas.    Abdominal exam subjectively with increased tenderness in the epigastrium and left upper quadrant.  No peritoneal signs.  Labs ordered in triage.  Leukocytosis stable, chronic.  Her labs and urinalysis are suggestive of hemoconcentration.  Will give IV fluids.  Patient ordered to receive Toradol, Maalox/lidocaine, Reglan, Protonix.  Will monitor and  reassess.  2:15 AM Patient reassessed. Appears comfortable. Intermittently sleeping. Declines additional pain medication. States she is feeling better and is open to PO challenge.  2:49 AM Patient drank some water, but reports nausea with small p.o. intake.  She was ordered to receive Phenergan.  4:40 AM Repeat abdominal exam stable. Patient up and ambulatory to the bathroom.  She states that her nausea has improved.  She is hesitant to further fluid challenge secondary to concern for worsening pain.  She feels comfortable with plan for discharge and continued symptom control at home.  Will add Protonix, Norco for symptom management.  Was told to discontinue Zofran as she feels this makes her more nauseous.  Will replace with Reglan.   Final Clinical Impressions(s) / ED Diagnoses   Final diagnoses:  Generalized abdominal pain    ED Discharge Orders         Ordered    pantoprazole (PROTONIX) 20 MG tablet  Daily     12/13/18 0439    metoCLOPramide (REGLAN) 10 MG tablet  Every 6 hours PRN     12/13/18 0439    HYDROcodone-acetaminophen (NORCO/VICODIN) 5-325 MG tablet  Every 6 hours PRN     12/13/18 0439           Antonietta Breach, PA-C 58/85/02 [id number]    Glick, David, MD 28/78/67 [phone]

## 2018-12-14 LAB — POC OCCULT BLOOD, ED: Fecal Occult Bld: POSITIVE — AB

## 2018-12-15 ENCOUNTER — Other Ambulatory Visit: Payer: Self-pay

## 2018-12-15 ENCOUNTER — Encounter (HOSPITAL_COMMUNITY): Payer: Self-pay | Admitting: Emergency Medicine

## 2018-12-15 ENCOUNTER — Emergency Department (HOSPITAL_COMMUNITY)
Admission: EM | Admit: 2018-12-15 | Discharge: 2018-12-15 | Disposition: A | Discharge status: Home / Self Care | Payer: Medicare Other | Attending: Emergency Medicine | Admitting: Emergency Medicine

## 2018-12-15 DIAGNOSIS — R197 Diarrhea, unspecified: Secondary | ICD-10-CM | POA: Insufficient documentation

## 2018-12-15 DIAGNOSIS — M069 Rheumatoid arthritis, unspecified: Secondary | ICD-10-CM | POA: Insufficient documentation

## 2018-12-15 DIAGNOSIS — F17210 Nicotine dependence, cigarettes, uncomplicated: Secondary | ICD-10-CM | POA: Diagnosis not present

## 2018-12-15 DIAGNOSIS — I10 Essential (primary) hypertension: Secondary | ICD-10-CM | POA: Insufficient documentation

## 2018-12-15 DIAGNOSIS — R112 Nausea with vomiting, unspecified: Secondary | ICD-10-CM | POA: Diagnosis not present

## 2018-12-15 DIAGNOSIS — Z79899 Other long term (current) drug therapy: Secondary | ICD-10-CM | POA: Diagnosis not present

## 2018-12-15 DIAGNOSIS — R1013 Epigastric pain: Secondary | ICD-10-CM | POA: Insufficient documentation

## 2018-12-15 DIAGNOSIS — Z794 Long term (current) use of insulin: Secondary | ICD-10-CM | POA: Insufficient documentation

## 2018-12-15 DIAGNOSIS — E119 Type 2 diabetes mellitus without complications: Secondary | ICD-10-CM | POA: Diagnosis not present

## 2018-12-15 LAB — CBC WITH DIFFERENTIAL/PLATELET
Abs Immature Granulocytes: 0.06 10*3/uL (ref 0.00–0.07)
Basophils Absolute: 0 10*3/uL (ref 0.0–0.1)
Basophils Relative: 0 %
Eosinophils Absolute: 0 10*3/uL (ref 0.0–0.5)
Eosinophils Relative: 0 %
HCT: 47.2 % — ABNORMAL HIGH (ref 36.0–46.0)
Hemoglobin: 15.7 g/dL — ABNORMAL HIGH (ref 12.0–15.0)
Immature Granulocytes: 1 %
Lymphocytes Relative: 18 %
Lymphs Abs: 2.2 10*3/uL (ref 0.7–4.0)
MCH: 28.1 pg (ref 26.0–34.0)
MCHC: 33.3 g/dL (ref 30.0–36.0)
MCV: 84.6 fL (ref 80.0–100.0)
Monocytes Absolute: 0.5 10*3/uL (ref 0.1–1.0)
Monocytes Relative: 4 %
Neutro Abs: 9.4 10*3/uL — ABNORMAL HIGH (ref 1.7–7.7)
Neutrophils Relative %: 77 %
Platelets: 373 10*3/uL (ref 150–400)
RBC: 5.58 MIL/uL — ABNORMAL HIGH (ref 3.87–5.11)
RDW: 13.1 % (ref 11.5–15.5)
WBC: 12.2 10*3/uL — ABNORMAL HIGH (ref 4.0–10.5)
nRBC: 0 % (ref 0.0–0.2)

## 2018-12-15 LAB — COMPREHENSIVE METABOLIC PANEL
ALT: 24 U/L (ref 0–44)
AST: 20 U/L (ref 15–41)
Albumin: 4.5 g/dL (ref 3.5–5.0)
Alkaline Phosphatase: 107 U/L (ref 38–126)
Anion gap: 14 (ref 5–15)
BUN: 11 mg/dL (ref 6–20)
CO2: 26 mmol/L (ref 22–32)
Calcium: 9.4 mg/dL (ref 8.9–10.3)
Chloride: 97 mmol/L — ABNORMAL LOW (ref 98–111)
Creatinine, Ser: 0.77 mg/dL (ref 0.44–1.00)
GFR calc Af Amer: >60 mL/min (ref >60)
GFR calc non Af Amer: >60 mL/min (ref >60)
Glucose, Bld: 258 mg/dL — ABNORMAL HIGH (ref 70–99)
Potassium: 3.4 mmol/L — ABNORMAL LOW (ref 3.5–5.1)
Sodium: 137 mmol/L (ref 135–145)
Total Bilirubin: 0.7 mg/dL (ref 0.3–1.2)
Total Protein: 7.7 g/dL (ref 6.5–8.1)

## 2018-12-15 LAB — LIPASE, BLOOD: Lipase: 27 U/L (ref 11–51)

## 2018-12-15 MED ORDER — HYDROMORPHONE HCL 1 MG/ML IJ SOLN
0.5000 mg | Freq: Once | INTRAMUSCULAR | Status: AC
  Administered 2018-12-15: 0.5 mg via INTRAVENOUS
  Filled 2018-12-15: qty 1

## 2018-12-15 MED ORDER — ONDANSETRON 4 MG PO TBDP: 4.0000 mg | ORAL_TABLET | Freq: Three times a day (TID) | ORAL | 0 refills | Status: DC | PRN

## 2018-12-15 MED ORDER — METOCLOPRAMIDE HCL 5 MG/ML IJ SOLN
5.0000 mg | Freq: Once | INTRAMUSCULAR | Status: AC
  Administered 2018-12-15: 5 mg via INTRAVENOUS
  Filled 2018-12-15: qty 2

## 2018-12-15 MED ORDER — METOCLOPRAMIDE HCL 10 MG PO TABS: 5.0000 mg | ORAL_TABLET | Freq: Once | ORAL | Status: DC

## 2018-12-15 MED ORDER — ONDANSETRON HCL 4 MG/2ML IJ SOLN
4.0000 mg | Freq: Once | INTRAMUSCULAR | Status: AC
  Administered 2018-12-15: 4 mg via INTRAVENOUS
  Filled 2018-12-15: qty 2

## 2018-12-15 MED ORDER — SODIUM CHLORIDE 0.9 % IV BOLUS
1000.0000 mL | Freq: Once | INTRAVENOUS | Status: AC
  Administered 2018-12-15: 1000 mL via INTRAVENOUS

## 2018-12-15 MED ORDER — FAMOTIDINE 20 MG PO TABS: 20.0000 mg | ORAL_TABLET | Freq: Two times a day (BID) | ORAL | 0 refills | Status: DC

## 2018-12-15 MED ORDER — FAMOTIDINE IN NACL 20-0.9 MG/50ML-% IV SOLN
20.0000 mg | Freq: Once | INTRAVENOUS | Status: AC
  Administered 2018-12-15: 20 mg via INTRAVENOUS
  Filled 2018-12-15: qty 50

## 2018-12-15 NOTE — ED Notes (Signed)
Pt reports she feels too nauseous to try and drink any fluids at this time.

## 2018-12-15 NOTE — ED Triage Notes (Signed)
Pt. Stated, My stomach hurts so bad, Im throwing up with diarrhea. This started last Saturday.

## 2018-12-15 NOTE — Discharge Instructions (Addendum)
Please read attached information. If you experience any new or worsening signs or symptoms please return to the emergency room for evaluation. Please follow-up with your primary care provider or specialist as discussed. Please use medication prescribed only as directed and discontinue taking if you have any concerning signs or symptoms.   °

## 2018-12-15 NOTE — ED Provider Notes (Signed)
[facility] EMERGENCY DEPARTMENT Provider Note   CSN: [id number] Arrival date & time: 12/15/18  3329   History   Chief Complaint Chief Complaint  Patient presents with  . Abdominal Pain  . Nausea  . Emesis  . Diarrhea    HPI [name] is a 53 y.o. female.     HPI    53 year old female with a past medical history of pancreatitis, diabetes, hyperlipidemia, cholecystectomy, hysterectomy presents today with complaints of abdominal pain.  She notes symptoms started approximately with epigastric abdominal pain.  She notes associated nonbloody vomiting and diarrhea.  She notes the pain has stayed in the epigastric region no lower abdominal pain vaginal bleeding or discharge.  She notes yesterday she was able to tolerate p.o. morning she had soup and [employer].  She notes today she has been unable to tolerate p.o.  She notes history of the same with pancreatitis.  She denies any alcohol use.  No recent antibiotics.   Past Medical History:  Diagnosis Date  . Anxiety   . Arthritis    rheumatoid , osteoarthritis  . Bronchitis   . Chronic back pain   . Depression   . Diabetes mellitus   . Fatty liver   . Fibromyalgia   . GERD (gastroesophageal reflux disease)   . Headache   . Hypertension   . Pancreatitis   . Pneumonia   . Rotator cuff tear, right     Patient Active Problem List   Diagnosis Date Noted  . Complete tear of right rotator cuff 07/22/2017    Class: Chronic  . Retained orthopedic hardware 07/22/2017    Class: Chronic  . Cervical disc herniation 07/21/2017    Class: Acute  . Status post cervical spinal fusion 07/21/2017  . Chronic back pain     Past Surgical History:  Procedure Laterality Date  . ABDOMINAL HYSTERECTOMY    . ANTERIOR CERVICAL DECOMP/DISCECTOMY FUSION N/A 07/21/2017   Procedure: ANTERIOR CERVICAL DISCECTOMY AND FUSION C6-7, REMOVAL OF SCREW C6 PLATE, DEPUY ZERO-P IMPLANT, LOCAL BONE GRAFT, ALLOGRAFT BONE GRAFT,  VIVIGEN;  Surgeon: Jessy Oto, MD;  Location: [facility];  Service: Orthopedics;  Laterality: N/A;  . BACK SURGERY     laminectomy  . BONE GRAFT HIP ILIAC CREST    . BREAST BIOPSY    . CARPAL TUNNEL RELEASE    . CERVICAL DISCECTOMY  07/21/2017  . CESAREAN SECTION    . CHOLECYSTECTOMY    . COLONOSCOPY    . frontal fusion    . neck fusion     patient reported  . SHOULDER ARTHROSCOPY WITH SUBACROMIAL DECOMPRESSION, ROTATOR CUFF REPAIR AND BICEP TENDON REPAIR Right 10/31/2017   Procedure: RIGHT SHOULDER ARTHROSCOPY, MANIPULATION UNDER ANESTHESIA, BICEPS TENODESIS, AND MINI OPEN ROTATOR CUFF TEAR REPAIR;  Surgeon: Meredith Pel, MD;  Location: [facility];  Service: Orthopedics;  Laterality: Right;     OB History   No obstetric history on file.      Home Medications    Prior to Admission medications   Medication Sig Start Date End Date Taking? Authorizing Provider  albuterol (PROVENTIL HFA;VENTOLIN HFA) 108 (90 BASE) MCG/ACT inhaler Inhale 2 puffs into the lungs every 4 (four) hours as needed for wheezing.   Yes [provider]  albuterol (PROVENTIL) (2.5 MG/3ML) 0.083% nebulizer solution Take 2.5 mg by nebulization every 6 (six) hours as needed for wheezing or shortness of breath.   Yes [provider]  ALPRAZolam Duanne Moron) 0.5 MG tablet Take 0.5  mg by mouth daily.    Yes [provider]  amitriptyline (ELAVIL) 10 MG tablet TAKE 1 TABLET(10 MG) BY MOUTH AT BEDTIME Patient taking differently: Take 10 mg by mouth at bedtime.  08/07/18  Yes Stanbery, Mary L, PA-C  AZOR 5-40 MG per tablet Take 1 tablet by mouth daily.  02/06/14  Yes [provider]  cyclobenzaprine (FLEXERIL) 10 MG tablet TAKE 1 TABLET BY MOUTH THREE TIMES DAILY( EVERY EIGHT HOURS) FOR SPASMS Patient taking differently: Take 10 mg by mouth as needed for muscle spasms.  12/18/17  Yes Meredith Pel, MD  dicyclomine (BENTYL) 20 MG tablet Take 1 tablet (20 mg total) by mouth every 8 (eight)  hours as needed for spasms (Abdominal cramping). 12/11/18  Yes Ward, Delice Bison, DO  fluticasone (FLONASE) 50 MCG/ACT nasal spray Place 2 sprays into both nostrils daily as needed for allergies or rhinitis.    Yes [provider]  HYDROcodone-acetaminophen (NORCO/VICODIN) 5-325 MG tablet Take 1 tablet by mouth every 6 (six) hours as needed for severe pain. 12/13/18  Yes Antonietta Breach, PA-C  ibuprofen (ADVIL) 800 MG tablet Take 800 mg by mouth 2 (two) times daily as needed for mild pain.  11/25/18  Yes [provider]  insulin glargine (LANTUS) 100 unit/mL SOPN Inject 40 Units into the skin at bedtime.    Yes [provider]  loperamide (IMODIUM) 2 MG capsule Take 1 capsule (2 mg total) by mouth 4 (four) times daily as needed for diarrhea or loose stools. 12/11/18  Yes Ward, Delice Bison, DO  metFORMIN (GLUCOPHAGE) 1000 MG tablet Take 1,000 mg by mouth 2 (two) times daily. 07/29/17  Yes [provider]  metoprolol succinate (TOPROL-XL) 50 MG 24 hr tablet Take 50 mg by mouth daily. 07/21/14  Yes [provider]  simvastatin (ZOCOR) 20 MG tablet Take 20 mg by mouth daily.    Yes [provider]  Vilazodone HCl (VIIBRYD) 10 MG TABS Take 10 mg by mouth daily.   Yes [provider]  baclofen (LIORESAL) 10 MG tablet 1 po tid prn Patient not taking: Reported on 12/15/2018 11/13/17   Meredith Pel, MD  diazepam (VALIUM) 5 MG tablet Take one tablet po at the MRI scanner, may repeat x 1. Patient not taking: Reported on 12/11/2018 03/30/18   Jessy Oto, MD  diclofenac sodium (VOLTAREN) 1 % GEL Apply 2 g topically 4 (four) times daily. Patient not taking: Reported on 12/15/2018 04/22/18   Jessy Oto, MD  famotidine (PEPCID) 20 MG tablet Take 1 tablet (20 mg total) by mouth 2 (two) times daily. 12/15/18   Sherril Shipman, Dellis Filbert, PA-C  gabapentin (NEURONTIN) 100 MG capsule Take 1 capsule (100 mg total) by mouth at bedtime. Patient not taking: Reported on 12/15/2018  07/23/17   Jessy Oto, MD  lidocaine (XYLOCAINE) 2 % solution Use as directed 15 mLs in the mouth or throat as needed for mouth pain. Patient not taking: Reported on 12/11/2018 08/21/18   Larene Pickett, PA-C  methocarbamol (ROBAXIN) 500 MG tablet Take 1 tablet (500 mg total) by mouth 2 (two) times daily. Patient not taking: Reported on 12/11/2018 08/21/18   Larene Pickett, PA-C  metoCLOPramide (REGLAN) 10 MG tablet Take 1 tablet (10 mg total) by mouth every 6 (six) hours as needed for nausea or vomiting. Patient not taking: Reported on 12/15/2018 12/13/18   Antonietta Breach, PA-C  naproxen (NAPROSYN) 500 MG tablet Take 1 tablet (500 mg total) by mouth 2 (  two) times daily with a meal. Patient not taking: Reported on 12/11/2018 04/22/18   Jessy Oto, MD  ondansetron (ZOFRAN ODT) 4 MG disintegrating tablet Take 1 tablet (4 mg total) by mouth every 8 (eight) hours as needed for nausea or vomiting. 12/15/18   Aldean Pipe, Dellis Filbert, PA-C  oxycodone (OXY-IR) 5 MG capsule 1 to 2 po q 4hrs prn pnain Patient not taking: Reported on 12/11/2018 11/13/17   Meredith Pel, MD  oxyCODONE (ROXICODONE) 5 MG immediate release tablet 1-2 po q 6 hrs prn pain Patient not taking: Reported on 12/11/2018 12/11/17   Meredith Pel, MD  pantoprazole (PROTONIX) 20 MG tablet Take 1 tablet (20 mg total) by mouth daily. Patient not taking: Reported on 12/15/2018 12/13/18   Antonietta Breach, PA-C    Family History Family History  Problem Relation Age of Onset  . Hypertension Father   . Renal Disease Father   . Diabetes Mother   . Hypertension Mother   . Edema Mother   . Diabetes Sister   . Diabetes Brother     Social History Social History   Tobacco Use  . Smoking status: Current Every Day Smoker    Packs/day: 0.50    Types: Cigarettes  . Smokeless tobacco: Never Used  Substance Use Topics  . Alcohol use: No  . Drug use: Yes    Types: Marijuana     Allergies   Patient has no known allergies.   Review of Systems  Review of Systems  All other systems reviewed and are negative.   Physical Exam Updated Vital Signs BP (!) 154/75   Pulse 86   Temp 98.4 F (36.9 C) (Oral)   Resp 18   SpO2 100%   Physical Exam Vitals signs and nursing note reviewed.  Constitutional:      Appearance: She is well-developed.  HENT:     Head: Normocephalic and atraumatic.  Eyes:     General: No scleral icterus.       Right eye: No discharge.        Left eye: No discharge.     Conjunctiva/sclera: Conjunctivae normal.     Pupils: Pupils are equal, round, and reactive to light.  Neck:     Musculoskeletal: Normal range of motion.     Vascular: No JVD.     Trachea: No tracheal deviation.  Pulmonary:     Effort: Pulmonary effort is normal.     Breath sounds: No stridor.  Abdominal:     General: There is no distension.     Palpations: Abdomen is soft.     Tenderness: There is abdominal tenderness. There is no guarding.     Comments: Minimal epigastric tenderness palpation, no right upper quadrant or lower abdominal tenderness  Neurological:     Mental Status: She is alert and oriented to person, place, and time.     Coordination: Coordination normal.  Psychiatric:        Behavior: Behavior normal.        Thought Content: Thought content normal.        Judgment: Judgment normal.     ED Treatments / Results  Labs (all labs ordered are listed, but only abnormal results are displayed) Labs Reviewed  CBC WITH DIFFERENTIAL/PLATELET - Abnormal; Notable for the following components:      Result Value   WBC 12.2 (*)    RBC 5.58 (*)    Hemoglobin 15.7 (*)    HCT 47.2 (*)    Neutro Abs 9.4 (*)  All other components within normal limits  COMPREHENSIVE METABOLIC PANEL - Abnormal; Notable for the following components:   Potassium 3.4 (*)    Chloride 97 (*)    Glucose, Bld 258 (*)    All other components within normal limits  LIPASE, BLOOD    EKG None  Radiology No results found.  Procedures  Procedures (including critical care time)  Medications Ordered in ED Medications  sodium chloride 0.9 % bolus 1,000 mL (1,000 mLs Intravenous New Bag/Given 12/15/18 0901)  HYDROmorphone (DILAUDID) injection 0.5 mg (0.5 mg Intravenous Given 12/15/18 0902)  metoCLOPramide (REGLAN) injection 5 mg (5 mg Intravenous Given 12/15/18 0902)  ondansetron (ZOFRAN) injection 4 mg (4 mg Intravenous Given 12/15/18 1021)  HYDROmorphone (DILAUDID) injection 0.5 mg (0.5 mg Intravenous Given 12/15/18 1354)  famotidine (PEPCID) IVPB 20 mg premix (20 mg Intravenous New Bag/Given 12/15/18 1359)     Initial Impression / Assessment and Plan / ED Course  I have reviewed the triage vital signs and the nursing notes.  Pertinent labs & imaging results that were available during my care of the patient were reviewed by me and considered in my medical decision making (see chart for details).        Labs: CBC, CMP lipase  Imaging:  Consults:  Therapeutics: Dilaudid, Pepcid, Reglan, Zofran, normal saline  Discharge Meds: Pepcid, Zofran  Assessment/Plan: 53 year old female presents today with abdominal pain.  Patient has been seen several times for this.  She has ongoing epigastric abdominal pain.  She had CT scan with no acute findings.  Her labs have remained stable.  She is afebrile.  She has had no vomiting while here in the emergency room.  She was given initial dose of pain medicine and antinausea medicine.  Upon reassessment she does have improvement in symptoms although she is still having some epigastric discomfort and nausea.  I attempted to p.o. challenge her she is on willing to attempt p.o. at this time.  Patient will given famotidine  and pain medication and reassess.  Patient symptoms are most consistent with gastritis.  Very low suspicion for acute gallbladder pathology.  No signs of acute intra-abdominal infection.  She has had CT recently with no acute abnormalities.   Patient had improvement in symptoms  with famotidine here.  Patient still does not want to attempt p.o. challenge but feels comfortable going home.  She notes she will return immediately if she develops any new or worsening signs or symptoms.  She will follow-up with her primary care provider for repeat evaluation and further management.  She is given strict return precautions, she verbalized understanding and agreement to today's plan.   Final Clinical Impressions(s) / ED Diagnoses   Final diagnoses:  Epigastric pain    ED Discharge Orders         Ordered    famotidine (PEPCID) 20 MG tablet  2 times daily     12/15/18 1455    ondansetron (ZOFRAN ODT) 4 MG disintegrating tablet  Every 8 hours PRN     12/15/18 1455           HedgesDellis Filbert, PA-C 12/15/18 [place], Belle, DO 12/16/18 1531

## 2018-12-20 ENCOUNTER — Other Ambulatory Visit: Payer: Self-pay

## 2018-12-20 ENCOUNTER — Emergency Department (HOSPITAL_COMMUNITY)
Admission: EM | Admit: 2018-12-20 | Discharge: 2018-12-20 | Disposition: A | Discharge status: Home / Self Care | Payer: Medicare Other | Attending: Emergency Medicine | Admitting: Emergency Medicine

## 2018-12-20 DIAGNOSIS — Z7984 Long term (current) use of oral hypoglycemic drugs: Secondary | ICD-10-CM | POA: Insufficient documentation

## 2018-12-20 DIAGNOSIS — E119 Type 2 diabetes mellitus without complications: Secondary | ICD-10-CM | POA: Insufficient documentation

## 2018-12-20 DIAGNOSIS — Z79899 Other long term (current) drug therapy: Secondary | ICD-10-CM | POA: Diagnosis not present

## 2018-12-20 DIAGNOSIS — R1013 Epigastric pain: Secondary | ICD-10-CM | POA: Insufficient documentation

## 2018-12-20 DIAGNOSIS — I10 Essential (primary) hypertension: Secondary | ICD-10-CM | POA: Insufficient documentation

## 2018-12-20 DIAGNOSIS — F17210 Nicotine dependence, cigarettes, uncomplicated: Secondary | ICD-10-CM | POA: Insufficient documentation

## 2018-12-20 LAB — COMPREHENSIVE METABOLIC PANEL
ALT: 23 U/L (ref 0–44)
AST: 20 U/L (ref 15–41)
Albumin: 4.3 g/dL (ref 3.5–5.0)
Alkaline Phosphatase: 98 U/L (ref 38–126)
Anion gap: 15 (ref 5–15)
BUN: 10 mg/dL (ref 6–20)
CO2: 26 mmol/L (ref 22–32)
Calcium: 9.2 mg/dL (ref 8.9–10.3)
Chloride: 95 mmol/L — ABNORMAL LOW (ref 98–111)
Creatinine, Ser: 0.7 mg/dL (ref 0.44–1.00)
GFR calc Af Amer: >60 mL/min (ref >60)
GFR calc non Af Amer: >60 mL/min (ref >60)
Glucose, Bld: 226 mg/dL — ABNORMAL HIGH (ref 70–99)
Potassium: 3.5 mmol/L (ref 3.5–5.1)
Sodium: 136 mmol/L (ref 135–145)
Total Bilirubin: 0.8 mg/dL (ref 0.3–1.2)
Total Protein: 7.3 g/dL (ref 6.5–8.1)

## 2018-12-20 LAB — LIPASE, BLOOD: Lipase: 37 U/L (ref 11–51)

## 2018-12-20 LAB — CBC WITH DIFFERENTIAL/PLATELET
Abs Immature Granulocytes: 0.05 10*3/uL (ref 0.00–0.07)
Basophils Absolute: 0 10*3/uL (ref 0.0–0.1)
Basophils Relative: 0 %
Eosinophils Absolute: 0 10*3/uL (ref 0.0–0.5)
Eosinophils Relative: 0 %
HCT: 44.8 % (ref 36.0–46.0)
Hemoglobin: 15.2 g/dL — ABNORMAL HIGH (ref 12.0–15.0)
Immature Granulocytes: 0 %
Lymphocytes Relative: 22 %
Lymphs Abs: 3.1 10*3/uL (ref 0.7–4.0)
MCH: 28 pg (ref 26.0–34.0)
MCHC: 33.9 g/dL (ref 30.0–36.0)
MCV: 82.5 fL (ref 80.0–100.0)
Monocytes Absolute: 0.7 10*3/uL (ref 0.1–1.0)
Monocytes Relative: 5 %
Neutro Abs: 10.1 10*3/uL — ABNORMAL HIGH (ref 1.7–7.7)
Neutrophils Relative %: 73 %
Platelets: 427 10*3/uL — ABNORMAL HIGH (ref 150–400)
RBC: 5.43 MIL/uL — ABNORMAL HIGH (ref 3.87–5.11)
RDW: 13.2 % (ref 11.5–15.5)
WBC: 13.9 10*3/uL — ABNORMAL HIGH (ref 4.0–10.5)
nRBC: 0 % (ref 0.0–0.2)

## 2018-12-20 MED ORDER — ALUM & MAG HYDROXIDE-SIMETH 200-200-20 MG/5ML PO SUSP
30.0000 mL | Freq: Once | ORAL | Status: AC
  Administered 2018-12-20: 12:00:00 30 mL via ORAL
  Filled 2018-12-20: qty 30

## 2018-12-20 MED ORDER — DICYCLOMINE HCL 10 MG/ML IM SOLN
20.0000 mg | Freq: Once | INTRAMUSCULAR | Status: AC
  Administered 2018-12-20: 20 mg via INTRAMUSCULAR
  Filled 2018-12-20: qty 2

## 2018-12-20 MED ORDER — SUCRALFATE 1 GM/10ML PO SUSP: 1.0000 g | Freq: Three times a day (TID) | ORAL | 0 refills | Status: DC

## 2018-12-20 MED ORDER — MORPHINE SULFATE (PF) 4 MG/ML IV SOLN
4.0000 mg | Freq: Once | INTRAVENOUS | Status: AC
  Administered 2018-12-20: 10:00:00 4 mg via INTRAVENOUS
  Filled 2018-12-20: qty 1

## 2018-12-20 MED ORDER — ONDANSETRON HCL 4 MG/2ML IJ SOLN: 4.0000 mg | Freq: Once | INTRAMUSCULAR | Status: DC

## 2018-12-20 MED ORDER — LIDOCAINE VISCOUS HCL 2 % MT SOLN
15.0000 mL | Freq: Once | OROMUCOSAL | Status: AC
  Administered 2018-12-20: 15 mL via ORAL
  Filled 2018-12-20: qty 15

## 2018-12-20 MED ORDER — METOCLOPRAMIDE HCL 5 MG/ML IJ SOLN
10.0000 mg | Freq: Once | INTRAMUSCULAR | Status: AC
  Administered 2018-12-20: 10:00:00 10 mg via INTRAVENOUS
  Filled 2018-12-20: qty 2

## 2018-12-20 MED ORDER — SODIUM CHLORIDE 0.9 % IV BOLUS
1000.0000 mL | Freq: Once | INTRAVENOUS | Status: AC
  Administered 2018-12-20: 10:00:00 1000 mL via INTRAVENOUS

## 2018-12-20 MED ORDER — PANTOPRAZOLE SODIUM 40 MG IV SOLR
40.0000 mg | Freq: Once | INTRAVENOUS | Status: AC
  Administered 2018-12-20: 10:00:00 40 mg via INTRAVENOUS
  Filled 2018-12-20: qty 40

## 2018-12-20 MED ORDER — ONDANSETRON 4 MG PO TBDP: ORAL_TABLET | ORAL | 0 refills | Status: DC

## 2018-12-20 MED ORDER — ESOMEPRAZOLE MAGNESIUM 40 MG PO CPDR: 40.0000 mg | DELAYED_RELEASE_CAPSULE | Freq: Every day | ORAL | 0 refills | Status: DC

## 2018-12-20 NOTE — ED Triage Notes (Signed)
Pt presents to the ED with RUQ abdominal pain that has been going on for about three days. Pt reports she has a history of pancreatitis. Pt also reports n/v/d for the past two days. Pt reports she was just here this week for the same complaint.

## 2018-12-20 NOTE — Discharge Instructions (Signed)
I suspect that your recurrent abdominal pain is due to gastritis, inflammation and irritation of the wall of your stomach.  To help manage this please limit the acidic foods you are eating as well as spicy, greasy or fried foods.  Take Nexium (esomeprazole) once daily at least 30 minutes before your first meal of the day to help decrease acid production.  Use the Bentyl that you were recently prescribed as needed for pain and spasm  Take Carafate before each meal and bedtime to help coat your stomach.  Use Zofran as needed for nausea and vomiting.  Please follow-up with Dr. Collene Mares with GI who you have seen previously, if this pain persists you will likely need an endoscopy to look at the lining of her stomach.

## 2018-12-20 NOTE — ED Notes (Signed)
Patient verbalized understanding of discharge instructions and denies any further needs or questions at this time. VS stable. Patient ambulatory with steady gait. Assisted to ED entrance in wheelchair.   

## 2018-12-20 NOTE — ED Notes (Signed)
Patient provided crackers - tolerating well at this time.

## 2018-12-20 NOTE — ED Provider Notes (Signed)
[facility] EMERGENCY DEPARTMENT Provider Note   CSN: [id number] Arrival date & time: 12/20/18  9379    History   Chief Complaint Chief Complaint  Patient presents with  . Abdominal Pain    HPI [name] is a 53 y.o. female.     [name] is a 53 y.o. female with a history of pancreatitis, diabetes, hyperlipidemia, cholecystectomy, hysterectomy, fibromyalgia, GERD, and chronic back pain, who presents to the emergency department for evaluation of epigastric abdominal pain.  She reports that this is been an ongoing issue for the past 3 weeks she has been seen in the emergency department multiple times recently, most recently on 5/5, this is her fourth ED visit for the symptoms.  She reports that for the past 3 days she has had nausea vomiting and diarrhea associated with this epigastric abdominal pain.  She denies any blood in her emesis or stool.  She last vomited at 3 AM this morning.  She reports chills but has not taken her temperature at home.  Denies any lower abdominal pain, burning, pain with urination or urinary frequency, no vaginal discharge or bleeding.  She reports that she last tried to eat something yesterday when she had a pair and drink some water but then began vomiting again.  Unsure of any triggers for this pain.  Reports pain sometimes radiates into the left upper and right upper quadrants but is primarily present over the epigastrium.  No associated chest pain, shortness of breath or cough.  Denies alcohol use.  She reports similar symptoms in the past with pancreatitis and feels this may be causing her symptoms although every time she has been evaluated during her 3 previous visit she has had normal pancreatic enzymes.  She has been unable to follow-up with her PCP and has not seen a GI doctor.     Past Medical History:  Diagnosis Date  . Anxiety   . Arthritis    rheumatoid , osteoarthritis  . Bronchitis   . Chronic back pain   .  Depression   . Diabetes mellitus   . Fatty liver   . Fibromyalgia   . GERD (gastroesophageal reflux disease)   . Headache   . Hypertension   . Pancreatitis   . Pneumonia   . Rotator cuff tear, right     Patient Active Problem List   Diagnosis Date Noted  . Complete tear of right rotator cuff 07/22/2017    Class: Chronic  . Retained orthopedic hardware 07/22/2017    Class: Chronic  . Cervical disc herniation 07/21/2017    Class: Acute  . Status post cervical spinal fusion 07/21/2017  . Chronic back pain     Past Surgical History:  Procedure Laterality Date  . ABDOMINAL HYSTERECTOMY    . ANTERIOR CERVICAL DECOMP/DISCECTOMY FUSION N/A 07/21/2017   Procedure: ANTERIOR CERVICAL DISCECTOMY AND FUSION C6-7, REMOVAL OF SCREW C6 PLATE, DEPUY ZERO-P IMPLANT, LOCAL BONE GRAFT, ALLOGRAFT BONE GRAFT, VIVIGEN;  Surgeon: Jessy Oto, MD;  Location: [place];  Service: Orthopedics;  Laterality: N/A;  . BACK SURGERY     laminectomy  . BONE GRAFT HIP ILIAC CREST    . BREAST BIOPSY    . CARPAL TUNNEL RELEASE    . CERVICAL DISCECTOMY  07/21/2017  . CESAREAN SECTION    . CHOLECYSTECTOMY    . COLONOSCOPY    . frontal fusion    . neck fusion     patient reported  . SHOULDER ARTHROSCOPY WITH  SUBACROMIAL DECOMPRESSION, ROTATOR CUFF REPAIR AND BICEP TENDON REPAIR Right 10/31/2017   Procedure: RIGHT SHOULDER ARTHROSCOPY, MANIPULATION UNDER ANESTHESIA, BICEPS TENODESIS, AND MINI OPEN ROTATOR CUFF TEAR REPAIR;  Surgeon: Meredith Pel, MD;  Location: [place];  Service: Orthopedics;  Laterality: Right;     OB History   No obstetric history on file.      Home Medications    Prior to Admission medications   Medication Sig Start Date End Date Taking? Authorizing Provider  albuterol (PROVENTIL HFA;VENTOLIN HFA) 108 (90 BASE) MCG/ACT inhaler Inhale 2 puffs into the lungs every 4 (four) hours as needed for wheezing.    [provider]  albuterol (PROVENTIL) (2.5 MG/3ML) 0.083%  nebulizer solution Take 2.5 mg by nebulization every 6 (six) hours as needed for wheezing or shortness of breath.    [provider]  ALPRAZolam Duanne Moron) 0.5 MG tablet Take 0.5 mg by mouth daily.     [provider]  amitriptyline (ELAVIL) 10 MG tablet TAKE 1 TABLET(10 MG) BY MOUTH AT BEDTIME Patient taking differently: Take 10 mg by mouth at bedtime.  08/07/18   Aundra Dubin, PA-C  AZOR 5-40 MG per tablet Take 1 tablet by mouth daily.  02/06/14   [provider]  baclofen (LIORESAL) 10 MG tablet 1 po tid prn Patient not taking: Reported on 12/15/2018 11/13/17   Meredith Pel, MD  cyclobenzaprine (FLEXERIL) 10 MG tablet TAKE 1 TABLET BY MOUTH THREE TIMES DAILY( EVERY EIGHT HOURS) FOR SPASMS Patient taking differently: Take 10 mg by mouth as needed for muscle spasms.  12/18/17   Meredith Pel, MD  diazepam (VALIUM) 5 MG tablet Take one tablet po at the MRI scanner, may repeat x 1. Patient not taking: Reported on 12/11/2018 03/30/18   Jessy Oto, MD  diclofenac sodium (VOLTAREN) 1 % GEL Apply 2 g topically 4 (four) times daily. Patient not taking: Reported on 12/15/2018 04/22/18   Jessy Oto, MD  dicyclomine (BENTYL) 20 MG tablet Take 1 tablet (20 mg total) by mouth every 8 (eight) hours as needed for spasms (Abdominal cramping). 12/11/18   Ward, Delice Bison, DO  esomeprazole (NEXIUM) 40 MG capsule Take 1 capsule (40 mg total) by mouth daily. 12/20/18   Jacqlyn Larsen, PA-C  famotidine (PEPCID) 20 MG tablet Take 1 tablet (20 mg total) by mouth 2 (two) times daily. 12/15/18   Hedges, Dellis Filbert, PA-C  fluticasone (FLONASE) 50 MCG/ACT nasal spray Place 2 sprays into both nostrils daily as needed for allergies or rhinitis.     [provider]  gabapentin (NEURONTIN) 100 MG capsule Take 1 capsule (100 mg total) by mouth at bedtime. Patient not taking: Reported on 12/15/2018 07/23/17   Jessy Oto, MD  HYDROcodone-acetaminophen (NORCO/VICODIN) 5-325 MG tablet Take 1  tablet by mouth every 6 (six) hours as needed for severe pain. 12/13/18   Antonietta Breach, PA-C  ibuprofen (ADVIL) 800 MG tablet Take 800 mg by mouth 2 (two) times daily as needed for mild pain.  11/25/18   [provider]  insulin glargine (LANTUS) 100 unit/mL SOPN Inject 40 Units into the skin at bedtime.     [provider]  lidocaine (XYLOCAINE) 2 % solution Use as directed 15 mLs in the mouth or throat as needed for mouth pain. Patient not taking: Reported on 12/11/2018 08/21/18   Larene Pickett, PA-C  loperamide (IMODIUM) 2 MG capsule Take 1 capsule (2 mg total) by mouth 4 (four) times daily as needed  for diarrhea or loose stools. 12/11/18   Ward, Delice Bison, DO  metFORMIN (GLUCOPHAGE) 1000 MG tablet Take 1,000 mg by mouth 2 (two) times daily. 07/29/17   [provider]  methocarbamol (ROBAXIN) 500 MG tablet Take 1 tablet (500 mg total) by mouth 2 (two) times daily. Patient not taking: Reported on 12/11/2018 08/21/18   Larene Pickett, PA-C  metoCLOPramide (REGLAN) 10 MG tablet Take 1 tablet (10 mg total) by mouth every 6 (six) hours as needed for nausea or vomiting. Patient not taking: Reported on 12/15/2018 12/13/18   Antonietta Breach, PA-C  metoprolol succinate (TOPROL-XL) 50 MG 24 hr tablet Take 50 mg by mouth daily. 07/21/14   [provider]  naproxen (NAPROSYN) 500 MG tablet Take 1 tablet (500 mg total) by mouth 2 (two) times daily with a meal. Patient not taking: Reported on 12/11/2018 04/22/18   Jessy Oto, MD  ondansetron (ZOFRAN ODT) 4 MG disintegrating tablet Take 1 tablet (4 mg total) by mouth every 8 (eight) hours as needed for nausea or vomiting. 12/15/18   Hedges, Dellis Filbert, PA-C  ondansetron (ZOFRAN ODT) 4 MG disintegrating tablet 4mg  ODT q4 hours prn nausea/vomit 12/20/18   Jacqlyn Larsen, PA-C  oxycodone (OXY-IR) 5 MG capsule 1 to 2 po q 4hrs prn pnain Patient not taking: Reported on 12/11/2018 11/13/17   Meredith Pel, MD  oxyCODONE (ROXICODONE) 5 MG immediate  release tablet 1-2 po q 6 hrs prn pain Patient not taking: Reported on 12/11/2018 12/11/17   Meredith Pel, MD  pantoprazole (PROTONIX) 20 MG tablet Take 1 tablet (20 mg total) by mouth daily. Patient not taking: Reported on 12/15/2018 12/13/18   Antonietta Breach, PA-C  simvastatin (ZOCOR) 20 MG tablet Take 20 mg by mouth daily.     [provider]  sucralfate (CARAFATE) 1 GM/10ML suspension Take 10 mLs (1 g total) by mouth 4 (four) times daily -  with meals and at bedtime. 12/20/18   Jacqlyn Larsen, PA-C  Vilazodone HCl (VIIBRYD) 10 MG TABS Take 10 mg by mouth daily.    [provider]    Family History Family History  Problem Relation Age of Onset  . Hypertension Father   . Renal Disease Father   . Diabetes Mother   . Hypertension Mother   . Edema Mother   . Diabetes Sister   . Diabetes Brother     Social History Social History   Tobacco Use  . Smoking status: Current Every Day Smoker    Packs/day: 0.50    Types: Cigarettes  . Smokeless tobacco: Never Used  Substance Use Topics  . Alcohol use: No  . Drug use: Yes    Types: Marijuana     Allergies   Patient has no known allergies.   Review of Systems Review of Systems  Constitutional: Positive for chills. Negative for fever.  HENT: Negative.   Eyes: Negative for visual disturbance.  Respiratory: Negative for cough and shortness of breath.   Cardiovascular: Negative for chest pain.  Gastrointestinal: Positive for abdominal pain, diarrhea and vomiting. Negative for abdominal distention, blood in stool and constipation.  Genitourinary: Negative for dysuria, flank pain, frequency, hematuria and vaginal bleeding.  Musculoskeletal: Negative for arthralgias and myalgias.  Skin: Negative for color change and rash.  Neurological: Negative for dizziness, syncope and light-headedness.     Physical Exam Updated Vital Signs BP (!) 143/89   Pulse (!) 111   Temp 99.3 F (37.4 C) (Oral)   Resp 20  Ht 5\' 5"   (1.651 m)   Wt 104.3 kg   SpO2 95%   BMI 38.26 kg/m   Physical Exam Vitals signs and nursing note reviewed.  Constitutional:      General: She is not in acute distress.    Appearance: She is well-developed. She is obese. She is not diaphoretic.     Comments: Patient appears uncomfortable but is in no acute distress  HENT:     Head: Normocephalic and atraumatic.     Mouth/Throat:     Mouth: Mucous membranes are moist.     Pharynx: Oropharynx is clear.  Eyes:     General:        Right eye: No discharge.        Left eye: No discharge.  Cardiovascular:     Rate and Rhythm: Regular rhythm. Tachycardia present.     Heart sounds: Normal heart sounds. No murmur. No friction rub. No gallop.   Pulmonary:     Effort: Pulmonary effort is normal. No respiratory distress.     Breath sounds: Normal breath sounds.     Comments: Respirations equal and unlabored, patient able to speak in full sentences, lungs clear to auscultation bilaterally Abdominal:     General: Abdomen is flat. Bowel sounds are normal.     Palpations: Abdomen is soft.     Tenderness: There is abdominal tenderness in the epigastric area. There is no guarding or rebound.     Comments: Abdomen is soft and nondistended, bowel sounds present throughout, there is focal epigastric tenderness, there is not focal right upper quadrant tenderness, negative Murphy sign, no lower abdominal tenderness.  No peritoneal signs.  No CVA tenderness bilaterally.  Skin:    General: Skin is warm and dry.     Capillary Refill: Capillary refill takes less than 2 seconds.  Neurological:     Mental Status: She is alert and oriented to person, place, and time.     Coordination: Coordination normal.     Comments: Patient alert and oriented, speech is clear, following commands Moving all extremities without difficulty.  Psychiatric:        Mood and Affect: Mood normal.        Behavior: Behavior normal.      ED Treatments / Results  Labs (all  labs ordered are listed, but only abnormal results are displayed) Labs Reviewed  CBC WITH DIFFERENTIAL/PLATELET - Abnormal; Notable for the following components:      Result Value   WBC 13.9 (*)    RBC 5.43 (*)    Hemoglobin 15.2 (*)    Platelets 427 (*)    Neutro Abs 10.1 (*)    All other components within normal limits  COMPREHENSIVE METABOLIC PANEL - Abnormal; Notable for the following components:   Chloride 95 (*)    Glucose, Bld 226 (*)    All other components within normal limits  LIPASE, BLOOD    EKG None  Radiology No results found.  Procedures Procedures (including critical care time)  Medications Ordered in ED Medications  sodium chloride 0.9 % bolus 1,000 mL (0 mLs Intravenous Stopped 12/20/18 1216)  morphine 4 MG/ML injection 4 mg (4 mg Intravenous Given 12/20/18 1019)  pantoprazole (PROTONIX) injection 40 mg (40 mg Intravenous Given 12/20/18 1019)  metoCLOPramide (REGLAN) injection 10 mg (10 mg Intravenous Given 12/20/18 1019)  dicyclomine (BENTYL) injection 20 mg (20 mg Intramuscular Given 12/20/18 1027)  alum & mag hydroxide-simeth (MAALOX/MYLANTA) 200-200-20 MG/5ML suspension 30 mL (30 mLs Oral Given  12/20/18 1140)    And  lidocaine (XYLOCAINE) 2 % viscous mouth solution 15 mL (15 mLs Oral Given 12/20/18 1140)     Initial Impression / Assessment and Plan / ED Course  I have reviewed the triage vital signs and the nursing notes.  Pertinent labs & imaging results that were available during my care of the patient were reviewed by me and considered in my medical decision making (see chart for details).  Patient presents for evaluation of epigastric abdominal pain, most recent episode has been ongoing for 3 days but patient has been seen multiple times in the emergency department over the past few weeks with similar symptoms.  Had a normal CT scan on 5/1, symptoms have not changed and pain remains in the same location, and epigastrium which she continues to have  recurrent episodes of pain with vomiting causing her to present to the emergency department.  On arrival she is afebrile, appears uncomfortable but is in no acute distress, mildly hypertensive but vitals otherwise normal.  She has focal tenderness in the epigastric region, no focal right upper quadrant tenderness, negative Murphy sign and history of prior cholecystectomy, no lower abdominal tenderness, urinary or vaginal complaints.  I suspect this is gastritis, she does have a history of pancreatitis though, will check CBC, CMP and lipase and will focus on symptomatic treatment with IV fluids, morphine, Protonix, Reglan and Bentyl.  Labs overall reassuring, mild leukocytosis of 13.9, but again patient is afebrile, her hemoglobin is slightly elevated as well and I suspect there is some degree of hemoconcentration, this is a slight increase from when she was seen a few days ago, likely related to stress reaction, glucose of 226, no other significant electrolyte derangements, normal renal liver function and normal lipase.  On reevaluation patient reports she is feeling much better and is requesting something to eat and drink.  She was given a GI cocktail and then provided ginger ale and crackers which she was able to tolerate with no further vomiting and continued treatment of her pain.  I discussed at length with the patient dietary modifications for possible gastritis, will treat with PPI, Carafate, Bentyl and Zofran and have her follow-up with Dr. Collene Mares with GI who she has seen previously.  Strict return precautions discussed with the patient, she expresses understanding and agreement with this plan.  Discharged home in good condition.  Final Clinical Impressions(s) / ED Diagnoses   Final diagnoses:  Epigastric pain    ED Discharge Orders         Ordered    esomeprazole (NEXIUM) 40 MG capsule  Daily     12/20/18 1159    sucralfate (CARAFATE) 1 GM/10ML suspension  3 times daily with meals & bedtime      12/20/18 1159    ondansetron (ZOFRAN ODT) 4 MG disintegrating tablet     12/20/18 1159           Janet Berlin 12/20/18 1229    Davonna Belling, MD 12/20/18 1459

## 2018-12-24 ENCOUNTER — Other Ambulatory Visit: Payer: Self-pay | Admitting: Gastroenterology

## 2018-12-24 ENCOUNTER — Other Ambulatory Visit (HOSPITAL_COMMUNITY): Payer: Self-pay | Admitting: Gastroenterology

## 2018-12-24 DIAGNOSIS — R1013 Epigastric pain: Secondary | ICD-10-CM

## 2018-12-24 DIAGNOSIS — R112 Nausea with vomiting, unspecified: Secondary | ICD-10-CM

## 2019-01-01 ENCOUNTER — Other Ambulatory Visit: Payer: Self-pay

## 2019-01-01 ENCOUNTER — Ambulatory Visit (HOSPITAL_COMMUNITY)
Admission: RE | Admit: 2019-01-01 | Discharge: 2019-01-01 | Disposition: A | Discharge status: Home / Self Care | Payer: Medicare Other | Source: Ambulatory Visit | Attending: Gastroenterology | Admitting: Gastroenterology

## 2019-01-01 DIAGNOSIS — R1013 Epigastric pain: Secondary | ICD-10-CM | POA: Diagnosis present

## 2019-01-01 DIAGNOSIS — R112 Nausea with vomiting, unspecified: Secondary | ICD-10-CM | POA: Insufficient documentation

## 2019-01-01 MED ORDER — TECHNETIUM TC 99M SULFUR COLLOID: 2.0100 | Freq: Once | INTRAVENOUS | Status: DC | PRN

## 2019-01-01 MED ORDER — TECHNETIUM TC 99M SULFUR COLLOID
2.0100 | Freq: Once | INTRAVENOUS | Status: AC | PRN
  Administered 2019-01-01: 2.01 via ORAL

## 2019-05-25 ENCOUNTER — Other Ambulatory Visit: Payer: Self-pay | Admitting: Internal Medicine

## 2019-05-25 DIAGNOSIS — Z1231 Encounter for screening mammogram for malignant neoplasm of breast: Secondary | ICD-10-CM

## 2019-07-12 ENCOUNTER — Encounter (HOSPITAL_COMMUNITY): Payer: Self-pay | Admitting: Emergency Medicine

## 2019-07-12 ENCOUNTER — Emergency Department (HOSPITAL_COMMUNITY)
Admission: EM | Admit: 2019-07-12 | Discharge: 2019-07-13 | Disposition: A | Discharge status: Home / Self Care | Payer: Medicare Other | Attending: Emergency Medicine | Admitting: Emergency Medicine

## 2019-07-12 ENCOUNTER — Other Ambulatory Visit: Payer: Self-pay

## 2019-07-12 DIAGNOSIS — M5442 Lumbago with sciatica, left side: Secondary | ICD-10-CM | POA: Insufficient documentation

## 2019-07-12 DIAGNOSIS — R197 Diarrhea, unspecified: Secondary | ICD-10-CM | POA: Insufficient documentation

## 2019-07-12 DIAGNOSIS — Z79899 Other long term (current) drug therapy: Secondary | ICD-10-CM | POA: Insufficient documentation

## 2019-07-12 DIAGNOSIS — R1032 Left lower quadrant pain: Secondary | ICD-10-CM | POA: Diagnosis present

## 2019-07-12 DIAGNOSIS — R112 Nausea with vomiting, unspecified: Secondary | ICD-10-CM | POA: Diagnosis not present

## 2019-07-12 DIAGNOSIS — R109 Unspecified abdominal pain: Secondary | ICD-10-CM

## 2019-07-12 DIAGNOSIS — M069 Rheumatoid arthritis, unspecified: Secondary | ICD-10-CM | POA: Diagnosis not present

## 2019-07-12 DIAGNOSIS — F17210 Nicotine dependence, cigarettes, uncomplicated: Secondary | ICD-10-CM | POA: Diagnosis not present

## 2019-07-12 LAB — CBC
HCT: 44.8 % (ref 36.0–46.0)
Hemoglobin: 14.6 g/dL (ref 12.0–15.0)
MCH: 28.4 pg (ref 26.0–34.0)
MCHC: 32.6 g/dL (ref 30.0–36.0)
MCV: 87.2 fL (ref 80.0–100.0)
Platelets: 371 10*3/uL (ref 150–400)
RBC: 5.14 MIL/uL — ABNORMAL HIGH (ref 3.87–5.11)
RDW: 14 % (ref 11.5–15.5)
WBC: 12.1 10*3/uL — ABNORMAL HIGH (ref 4.0–10.5)
nRBC: 0 % (ref 0.0–0.2)

## 2019-07-12 LAB — URINALYSIS, ROUTINE W REFLEX MICROSCOPIC
Bacteria, UA: NONE SEEN
Bilirubin Urine: NEGATIVE
Glucose, UA: NEGATIVE mg/dL
Ketones, ur: NEGATIVE mg/dL
Leukocytes,Ua: NEGATIVE
Nitrite: NEGATIVE
Protein, ur: 30 mg/dL — AB
Specific Gravity, Urine: 1.025 (ref 1.005–1.030)
pH: 5 (ref 5.0–8.0)

## 2019-07-12 MED ORDER — SODIUM CHLORIDE 0.9% FLUSH
3.0000 mL | Freq: Once | INTRAVENOUS | Status: AC
  Administered 2019-07-13: 3 mL via INTRAVENOUS

## 2019-07-12 NOTE — ED Triage Notes (Signed)
Pt from home c/o of left sided flank pain X3 days.  Pt denies any other symptoms other than nausea.

## 2019-07-13 ENCOUNTER — Ambulatory Visit
Admission: RE | Admit: 2019-07-13 | Discharge: 2019-07-13 | Disposition: A | Discharge status: Home / Self Care | Payer: Medicare Other | Source: Ambulatory Visit | Attending: Internal Medicine | Admitting: Internal Medicine

## 2019-07-13 ENCOUNTER — Emergency Department (HOSPITAL_COMMUNITY): Payer: Medicare Other

## 2019-07-13 DIAGNOSIS — Z1231 Encounter for screening mammogram for malignant neoplasm of breast: Secondary | ICD-10-CM

## 2019-07-13 DIAGNOSIS — M5442 Lumbago with sciatica, left side: Secondary | ICD-10-CM | POA: Diagnosis not present

## 2019-07-13 LAB — COMPREHENSIVE METABOLIC PANEL
ALT: 17 U/L (ref 0–44)
AST: 21 U/L (ref 15–41)
Albumin: 4.1 g/dL (ref 3.5–5.0)
Alkaline Phosphatase: 86 U/L (ref 38–126)
Anion gap: 10 (ref 5–15)
BUN: 9 mg/dL (ref 6–20)
CO2: 24 mmol/L (ref 22–32)
Calcium: 9.2 mg/dL (ref 8.9–10.3)
Chloride: 106 mmol/L (ref 98–111)
Creatinine, Ser: 0.77 mg/dL (ref 0.44–1.00)
GFR calc Af Amer: >60 mL/min (ref >60)
GFR calc non Af Amer: >60 mL/min (ref >60)
Glucose, Bld: 124 mg/dL — ABNORMAL HIGH (ref 70–99)
Potassium: 3.9 mmol/L (ref 3.5–5.1)
Sodium: 140 mmol/L (ref 135–145)
Total Bilirubin: 0.6 mg/dL (ref 0.3–1.2)
Total Protein: 7.2 g/dL (ref 6.5–8.1)

## 2019-07-13 LAB — LIPASE, BLOOD: Lipase: 49 U/L (ref 11–51)

## 2019-07-13 LAB — I-STAT BETA HCG BLOOD, ED (MC, WL, AP ONLY): I-stat hCG, quantitative: <5 m[IU]/mL (ref <5)

## 2019-07-13 MED ORDER — ONDANSETRON 4 MG PO TBDP: 4.0000 mg | ORAL_TABLET | Freq: Three times a day (TID) | ORAL | 0 refills | Status: DC | PRN

## 2019-07-13 MED ORDER — CYCLOBENZAPRINE HCL 10 MG PO TABS: 10.0000 mg | ORAL_TABLET | Freq: Two times a day (BID) | ORAL | 0 refills | Status: DC | PRN

## 2019-07-13 MED ORDER — IBUPROFEN 800 MG PO TABS: 800.0000 mg | ORAL_TABLET | Freq: Three times a day (TID) | ORAL | 0 refills | Status: AC

## 2019-07-13 MED ORDER — HYDROMORPHONE HCL 1 MG/ML IJ SOLN
1.0000 mg | Freq: Once | INTRAMUSCULAR | Status: AC
  Administered 2019-07-13: 1 mg via INTRAVENOUS
  Filled 2019-07-13: qty 1

## 2019-07-13 MED ORDER — ONDANSETRON HCL 4 MG/2ML IJ SOLN
4.0000 mg | Freq: Once | INTRAMUSCULAR | Status: AC
  Administered 2019-07-13: 4 mg via INTRAVENOUS
  Filled 2019-07-13: qty 2

## 2019-07-13 MED ORDER — LIDOCAINE 5 % EX PTCH: 1.0000 | MEDICATED_PATCH | CUTANEOUS | 0 refills | Status: DC

## 2019-07-13 MED ORDER — SODIUM CHLORIDE 0.9 % IV BOLUS
1000.0000 mL | Freq: Once | INTRAVENOUS | Status: AC
  Administered 2019-07-13: 1000 mL via INTRAVENOUS

## 2019-07-13 NOTE — ED Provider Notes (Signed)
[facility] DEPARTMENT Provider Note   CSN: [id number] Arrival date & time: 07/12/19  2200     History   Chief Complaint Chief Complaint  Patient presents with   Flank Pain    HPI [name] is a 53 y.o. female.     Patient presents to the emergency department with a chief complaint of left-sided flank pain.  She states she has had the symptoms for the past 3 days.  She reports associated nausea, vomiting, and diarrhea.  She denies any fevers or chills.  She denies any dysuria or hematuria.  Denies history of kidney stones.  Denies injury to her back, but does report history of chronic back pain, and has had discectomies in the past.  The history is provided by the patient. No language interpreter was used.    Past Medical History:  Diagnosis Date   Anxiety    Arthritis    rheumatoid , osteoarthritis   Bronchitis    Chronic back pain    Depression    Diabetes mellitus    Fatty liver    Fibromyalgia    GERD (gastroesophageal reflux disease)    Headache    Hypertension    Pancreatitis    Pneumonia    Rotator cuff tear, right     Patient Active Problem List   Diagnosis Date Noted   Complete tear of right rotator cuff 07/22/2017    Class: Chronic   Retained orthopedic hardware 07/22/2017    Class: Chronic   Cervical disc herniation 07/21/2017    Class: Acute   Status post cervical spinal fusion 07/21/2017   Chronic back pain     Past Surgical History:  Procedure Laterality Date   ABDOMINAL HYSTERECTOMY     ANTERIOR CERVICAL DECOMP/DISCECTOMY FUSION N/A 07/21/2017   Procedure: ANTERIOR CERVICAL DISCECTOMY AND FUSION C6-7, REMOVAL OF SCREW C6 PLATE, DEPUY ZERO-P IMPLANT, LOCAL BONE GRAFT, ALLOGRAFT BONE GRAFT, VIVIGEN;  Surgeon: Jessy Oto, MD;  Location: [place];  Service: Orthopedics;  Laterality: N/A;   BACK SURGERY     laminectomy   BONE GRAFT HIP ILIAC CREST     BREAST BIOPSY     CARPAL TUNNEL  RELEASE     CERVICAL DISCECTOMY  07/21/2017   CESAREAN SECTION     CHOLECYSTECTOMY     COLONOSCOPY     frontal fusion     neck fusion     patient reported   SHOULDER ARTHROSCOPY WITH SUBACROMIAL DECOMPRESSION, ROTATOR CUFF REPAIR AND BICEP TENDON REPAIR Right 10/31/2017   Procedure: RIGHT SHOULDER ARTHROSCOPY, MANIPULATION UNDER ANESTHESIA, BICEPS TENODESIS, AND MINI OPEN ROTATOR CUFF TEAR REPAIR;  Surgeon: Meredith Pel, MD;  Location: [place];  Service: Orthopedics;  Laterality: Right;     OB History   No obstetric history on file.      Home Medications    Prior to Admission medications   Medication Sig Start Date End Date Taking? Authorizing Provider  albuterol (PROVENTIL HFA;VENTOLIN HFA) 108 (90 BASE) MCG/ACT inhaler Inhale 2 puffs into the lungs every 4 (four) hours as needed for wheezing.    [provider]  albuterol (PROVENTIL) (2.5 MG/3ML) 0.083% nebulizer solution Take 2.5 mg by nebulization every 6 (six) hours as needed for wheezing or shortness of breath.    [provider]  ALPRAZolam Duanne Moron) 0.5 MG tablet Take 0.5 mg by mouth daily.     [provider]  amitriptyline (ELAVIL) 10 MG tablet TAKE 1 TABLET(10 MG) BY MOUTH AT  BEDTIME Patient taking differently: Take 10 mg by mouth at bedtime.  08/07/18   Aundra Dubin, PA-C  AZOR 5-40 MG per tablet Take 1 tablet by mouth daily.  02/06/14   [provider]  baclofen (LIORESAL) 10 MG tablet 1 po tid prn Patient not taking: Reported on 12/15/2018 11/13/17   Meredith Pel, MD  cyclobenzaprine (FLEXERIL) 10 MG tablet TAKE 1 TABLET BY MOUTH THREE TIMES DAILY( EVERY EIGHT HOURS) FOR SPASMS Patient taking differently: Take 10 mg by mouth as needed for muscle spasms.  12/18/17   Meredith Pel, MD  diazepam (VALIUM) 5 MG tablet Take one tablet po at the MRI scanner, may repeat x 1. Patient not taking: Reported on 12/11/2018 03/30/18   Jessy Oto, MD  diclofenac sodium (VOLTAREN)  1 % GEL Apply 2 g topically 4 (four) times daily. Patient not taking: Reported on 12/15/2018 04/22/18   Jessy Oto, MD  dicyclomine (BENTYL) 20 MG tablet Take 1 tablet (20 mg total) by mouth every 8 (eight) hours as needed for spasms (Abdominal cramping). 12/11/18   Ward, Delice Bison, DO  esomeprazole (NEXIUM) 40 MG capsule Take 1 capsule (40 mg total) by mouth daily. 12/20/18   Jacqlyn Larsen, PA-C  famotidine (PEPCID) 20 MG tablet Take 1 tablet (20 mg total) by mouth 2 (two) times daily. 12/15/18   Hedges, Dellis Filbert, PA-C  fluticasone (FLONASE) 50 MCG/ACT nasal spray Place 2 sprays into both nostrils daily as needed for allergies or rhinitis.     [provider]  gabapentin (NEURONTIN) 100 MG capsule Take 1 capsule (100 mg total) by mouth at bedtime. Patient not taking: Reported on 12/15/2018 07/23/17   Jessy Oto, MD  HYDROcodone-acetaminophen (NORCO/VICODIN) 5-325 MG tablet Take 1 tablet by mouth every 6 (six) hours as needed for severe pain. 12/13/18   Antonietta Breach, PA-C  ibuprofen (ADVIL) 800 MG tablet Take 800 mg by mouth 2 (two) times daily as needed for mild pain.  11/25/18   [provider]  insulin glargine (LANTUS) 100 unit/mL SOPN Inject 40 Units into the skin at bedtime.     [provider]  lidocaine (XYLOCAINE) 2 % solution Use as directed 15 mLs in the mouth or throat as needed for mouth pain. Patient not taking: Reported on 12/11/2018 08/21/18   Larene Pickett, PA-C  loperamide (IMODIUM) 2 MG capsule Take 1 capsule (2 mg total) by mouth 4 (four) times daily as needed for diarrhea or loose stools. 12/11/18   Ward, Delice Bison, DO  metFORMIN (GLUCOPHAGE) 1000 MG tablet Take 1,000 mg by mouth 2 (two) times daily. 07/29/17   [provider]  methocarbamol (ROBAXIN) 500 MG tablet Take 1 tablet (500 mg total) by mouth 2 (two) times daily. Patient not taking: Reported on 12/11/2018 08/21/18   Larene Pickett, PA-C  metoCLOPramide (REGLAN) 10 MG tablet Take 1 tablet (10  mg total) by mouth every 6 (six) hours as needed for nausea or vomiting. Patient not taking: Reported on 12/15/2018 12/13/18   Antonietta Breach, PA-C  metoprolol succinate (TOPROL-XL) 50 MG 24 hr tablet Take 50 mg by mouth daily. 07/21/14   [provider]  naproxen (NAPROSYN) 500 MG tablet Take 1 tablet (500 mg total) by mouth 2 (two) times daily with a meal. Patient not taking: Reported on 12/11/2018 04/22/18   Jessy Oto, MD  ondansetron (ZOFRAN ODT) 4 MG disintegrating tablet Take 1 tablet (4 mg total) by mouth every 8 (eight) hours as  needed for nausea or vomiting. 12/15/18   Hedges, Dellis Filbert, PA-C  ondansetron (ZOFRAN ODT) 4 MG disintegrating tablet 4mg  ODT q4 hours prn nausea/vomit 12/20/18   Jacqlyn Larsen, PA-C  oxycodone (OXY-IR) 5 MG capsule 1 to 2 po q 4hrs prn pnain Patient not taking: Reported on 12/11/2018 11/13/17   Meredith Pel, MD  oxyCODONE (ROXICODONE) 5 MG immediate release tablet 1-2 po q 6 hrs prn pain Patient not taking: Reported on 12/11/2018 12/11/17   Meredith Pel, MD  pantoprazole (PROTONIX) 20 MG tablet Take 1 tablet (20 mg total) by mouth daily. Patient not taking: Reported on 12/15/2018 12/13/18   Antonietta Breach, PA-C  simvastatin (ZOCOR) 20 MG tablet Take 20 mg by mouth daily.     [provider]  sucralfate (CARAFATE) 1 GM/10ML suspension Take 10 mLs (1 g total) by mouth 4 (four) times daily -  with meals and at bedtime. 12/20/18   Jacqlyn Larsen, PA-C  Vilazodone HCl (VIIBRYD) 10 MG TABS Take 10 mg by mouth daily.    [provider]    Family History Family History  Problem Relation Age of Onset   Hypertension Father    Renal Disease Father    Diabetes Mother    Hypertension Mother    Edema Mother    Diabetes Sister    Diabetes Brother     Social History Social History   Tobacco Use   Smoking status: Current Every Day Smoker    Packs/day: 0.50    Types: Cigarettes   Smokeless tobacco: Never Used  Substance Use Topics    Alcohol use: No   Drug use: Yes    Types: Marijuana     Allergies   Patient has no known allergies.   Review of Systems Review of Systems  All other systems reviewed and are negative.    Physical Exam Updated Vital Signs BP (!) 144/84 (BP Location: Left Arm)    Pulse 86    Temp 98.8 F (37.1 C) (Oral)    Resp 16    Ht 5\' 5"  (1.651 m)    Wt 90.7 kg    SpO2 97%    BMI 33.28 kg/m   Physical Exam Vitals signs and nursing note reviewed.  Constitutional:      General: She is not in acute distress.    Appearance: She is well-developed.  HENT:     Head: Normocephalic and atraumatic.  Eyes:     Conjunctiva/sclera: Conjunctivae normal.  Neck:     Musculoskeletal: Neck supple.  Cardiovascular:     Rate and Rhythm: Normal rate and regular rhythm.     Heart sounds: No murmur.  Pulmonary:     Effort: Pulmonary effort is normal. No respiratory distress.     Breath sounds: Normal breath sounds.  Abdominal:     Palpations: Abdomen is soft.     Tenderness: There is no abdominal tenderness.     Comments: Left-sided CVA tenderness, but no anterior abdominal tenderness  Musculoskeletal:     Comments: Tenderness palpation over the lumbar paraspinal musculature and left flank  Skin:    General: Skin is warm and dry.  Neurological:     Mental Status: She is alert and oriented to person, place, and time.  Psychiatric:        Mood and Affect: Mood normal.        Behavior: Behavior normal.      ED Treatments / Results  Labs (all labs ordered are listed, but only  abnormal results are displayed) Labs Reviewed  COMPREHENSIVE METABOLIC PANEL - Abnormal; Notable for the following components:      Result Value   Glucose, Bld 124 (*)    All other components within normal limits  CBC - Abnormal; Notable for the following components:   WBC 12.1 (*)    RBC 5.14 (*)    All other components within normal limits  URINALYSIS, ROUTINE W REFLEX MICROSCOPIC - Abnormal; Notable for the  following components:   Hgb urine dipstick MODERATE (*)    Protein, ur 30 (*)    All other components within normal limits  LIPASE, BLOOD  I-STAT BETA HCG BLOOD, ED ([facility], [facility], [facility] ONLY)    EKG None  Radiology Ct Renal Stone Study  Result Date: 07/13/2019 CLINICAL DATA:  Left flank pain EXAM: CT ABDOMEN AND PELVIS WITHOUT CONTRAST TECHNIQUE: Multidetector CT imaging of the abdomen and pelvis was performed following the standard protocol without IV contrast. COMPARISON:  12/11/2018 FINDINGS: Lower chest: Lung bases are clear. No effusions. Heart is normal size. Hepatobiliary: No focal liver abnormality is seen. Status post cholecystectomy. No biliary dilatation. Pancreas: No focal abnormality or ductal dilatation. Spleen: No focal abnormality.  Normal size. Adrenals/Urinary Tract: Small nodule in the left adrenal gland is stable since prior study. No renal or ureteral stones. No hydronephrosis. Urinary bladder unremarkable. Stomach/Bowel: Normal appendix. Stomach, large and small bowel grossly unremarkable. Vascular/Lymphatic: No evidence of aneurysm or adenopathy. Scattered aortic atherosclerosis. Reproductive: Prior hysterectomy.  No adnexal masses. Other: No free fluid or free air. Musculoskeletal: No acute bony abnormality. Postoperative changes in the lumbar spine. IMPRESSION: No renal or ureteral stones.  No hydronephrosis. Prior cholecystectomy and hysterectomy. Normal appendix. No acute findings in the abdomen or pelvis. Electronically Signed   By: Rolm Baptise M.D.   On: 07/13/2019 03:59    Procedures Procedures (including critical care time)  Medications Ordered in ED Medications  sodium chloride flush (NS) 0.9 % injection 3 mL (has no administration in time range)  HYDROmorphone (DILAUDID) injection 1 mg (has no administration in time range)  ondansetron (ZOFRAN) injection 4 mg (has no administration in time range)  sodium chloride 0.9 % bolus 1,000 mL (has no administration in time  range)     Initial Impression / Assessment and Plan / ED Course  I have reviewed the triage vital signs and the nursing notes.  Pertinent labs & imaging results that were available during my care of the patient were reviewed by me and considered in my medical decision making (see chart for details).        Patient with left-sided flank pain.  She has also had nausea, vomiting, diarrhea.  She is afebrile.  Laboratory work-up was notable for mild nonspecific leukocytosis.  Her vital signs are stable.  She is nontoxic-appearing.  CT scan of the abdomen shows no evidence of kidney stones.  There is no evidence of acute intra-abdominal process.  Based on the tenderness to palpation the low back, believe her symptoms to be musculoskeletal.  She could have nerve impingement as she does report some radiating pain down the front of her left leg.  She is a poor candidate for prednisone taper due to her diabetes.  She has had lumbar discectomies in the past, she will likely need referral to neurosurgery.  At this time, unclear etiology for nausea, vomiting, diarrhea.  Will manage symptoms in the ED and anticipate discharge.  No clear source of infection.  I do not feel that any  additional emergent work-up is indicated at this time.  5:26 AM Patient feels significantly improved.  DC to home with neurosurg follow-up.  Final Clinical Impressions(s) / ED Diagnoses   Final diagnoses:  Flank pain  Acute left-sided low back pain with left-sided sciatica    ED Discharge Orders         Ordered    cyclobenzaprine (FLEXERIL) 10 MG tablet  2 times daily PRN     07/13/19 0528    ibuprofen (ADVIL) 800 MG tablet  3 times daily     07/13/19 0528    lidocaine (LIDODERM) 5 %  Every 24 hours     07/13/19 0528    ondansetron (ZOFRAN ODT) 4 MG disintegrating tablet  Every 8 hours PRN     07/13/19 0528           Montine Circle, PA-C 07/13/19 0528    Mesner, Corene Cornea, MD 07/13/19 [phone]

## 2019-07-13 NOTE — ED Notes (Signed)
Patient returned from CT

## 2019-07-14 ENCOUNTER — Other Ambulatory Visit: Payer: Self-pay

## 2019-07-14 ENCOUNTER — Encounter (HOSPITAL_COMMUNITY): Payer: Self-pay

## 2019-07-14 ENCOUNTER — Emergency Department (HOSPITAL_COMMUNITY)
Admission: EM | Admit: 2019-07-14 | Discharge: 2019-07-14 | Disposition: A | Discharge status: Home / Self Care | Payer: Medicare Other | Attending: Emergency Medicine | Admitting: Emergency Medicine

## 2019-07-14 DIAGNOSIS — I10 Essential (primary) hypertension: Secondary | ICD-10-CM | POA: Diagnosis not present

## 2019-07-14 DIAGNOSIS — F17210 Nicotine dependence, cigarettes, uncomplicated: Secondary | ICD-10-CM | POA: Insufficient documentation

## 2019-07-14 DIAGNOSIS — K529 Noninfective gastroenteritis and colitis, unspecified: Secondary | ICD-10-CM | POA: Diagnosis not present

## 2019-07-14 DIAGNOSIS — R197 Diarrhea, unspecified: Secondary | ICD-10-CM

## 2019-07-14 DIAGNOSIS — R1084 Generalized abdominal pain: Secondary | ICD-10-CM

## 2019-07-14 DIAGNOSIS — R109 Unspecified abdominal pain: Secondary | ICD-10-CM | POA: Diagnosis present

## 2019-07-14 DIAGNOSIS — R112 Nausea with vomiting, unspecified: Secondary | ICD-10-CM

## 2019-07-14 LAB — URINALYSIS, ROUTINE W REFLEX MICROSCOPIC
Bilirubin Urine: NEGATIVE
Glucose, UA: 150 mg/dL — AB
Ketones, ur: 20 mg/dL — AB
Leukocytes,Ua: NEGATIVE
Nitrite: NEGATIVE
Protein, ur: NEGATIVE mg/dL
Specific Gravity, Urine: 1.017 (ref 1.005–1.030)
pH: 6 (ref 5.0–8.0)

## 2019-07-14 LAB — CBC
HCT: 46.2 % — ABNORMAL HIGH (ref 36.0–46.0)
Hemoglobin: 15.4 g/dL — ABNORMAL HIGH (ref 12.0–15.0)
MCH: 28.8 pg (ref 26.0–34.0)
MCHC: 33.3 g/dL (ref 30.0–36.0)
MCV: 86.4 fL (ref 80.0–100.0)
Platelets: 379 10*3/uL (ref 150–400)
RBC: 5.35 MIL/uL — ABNORMAL HIGH (ref 3.87–5.11)
RDW: 13.8 % (ref 11.5–15.5)
WBC: 10.6 10*3/uL — ABNORMAL HIGH (ref 4.0–10.5)
nRBC: 0 % (ref 0.0–0.2)

## 2019-07-14 LAB — COMPREHENSIVE METABOLIC PANEL
ALT: 17 U/L (ref 0–44)
AST: 15 U/L (ref 15–41)
Albumin: 4.4 g/dL (ref 3.5–5.0)
Alkaline Phosphatase: 96 U/L (ref 38–126)
Anion gap: 12 (ref 5–15)
BUN: 5 mg/dL — ABNORMAL LOW (ref 6–20)
CO2: 25 mmol/L (ref 22–32)
Calcium: 9.2 mg/dL (ref 8.9–10.3)
Chloride: 101 mmol/L (ref 98–111)
Creatinine, Ser: 0.62 mg/dL (ref 0.44–1.00)
GFR calc Af Amer: >60 mL/min (ref >60)
GFR calc non Af Amer: >60 mL/min (ref >60)
Glucose, Bld: 175 mg/dL — ABNORMAL HIGH (ref 70–99)
Potassium: 3.8 mmol/L (ref 3.5–5.1)
Sodium: 138 mmol/L (ref 135–145)
Total Bilirubin: 0.8 mg/dL (ref 0.3–1.2)
Total Protein: 7.8 g/dL (ref 6.5–8.1)

## 2019-07-14 LAB — I-STAT BETA HCG BLOOD, ED (MC, WL, AP ONLY): I-stat hCG, quantitative: <5 m[IU]/mL (ref <5)

## 2019-07-14 LAB — LIPASE, BLOOD: Lipase: 36 U/L (ref 11–51)

## 2019-07-14 MED ORDER — HALOPERIDOL LACTATE 5 MG/ML IJ SOLN
5.0000 mg | Freq: Once | INTRAMUSCULAR | Status: AC
  Administered 2019-07-14: 5 mg via INTRAMUSCULAR
  Filled 2019-07-14: qty 1

## 2019-07-14 MED ORDER — SODIUM CHLORIDE 0.9% FLUSH: 3.0000 mL | Freq: Once | INTRAVENOUS | Status: DC

## 2019-07-14 MED ORDER — LIDOCAINE VISCOUS HCL 2 % MT SOLN
15.0000 mL | Freq: Once | OROMUCOSAL | Status: AC
  Administered 2019-07-14: 15 mL via ORAL
  Filled 2019-07-14: qty 15

## 2019-07-14 MED ORDER — ALUM & MAG HYDROXIDE-SIMETH 200-200-20 MG/5ML PO SUSP
30.0000 mL | Freq: Once | ORAL | Status: AC
  Administered 2019-07-14: 30 mL via ORAL
  Filled 2019-07-14: qty 30

## 2019-07-14 MED ORDER — PROMETHAZINE HCL 25 MG PO TABS: 25.0000 mg | ORAL_TABLET | Freq: Three times a day (TID) | ORAL | 0 refills | Status: DC | PRN

## 2019-07-14 NOTE — ED Provider Notes (Signed)
  Physical Exam  BP (!) 174/107   Pulse 93   Temp 98.3 F (36.8 C) (Oral)   Resp 16   SpO2 100%   Physical Exam  ED Course/Procedures     Procedures  MDM  Received patient in signout.  Nausea vomiting diarrhea abdominal pain.  CT scan 2 days ago reassuring.  Lab work reassuring now.  Feels somewhat better after IV fluids and treatment.  Will discharge home with symptomatic treatment.  Had previous work-up around 7 months ago without a cause for similar symptoms.  Patient does use marijuana occasionally but not heavily.  Cannabinoid hyperemesis syndrome felt less likely.       Davonna Belling, MD 07/14/19 1740

## 2019-07-14 NOTE — ED Provider Notes (Signed)
[facility] Emergency Department Provider Note MRN:  [id number]  Arrival date & time: 07/14/19     Chief Complaint   Abdominal pain History of Present Illness   [name] is a 53 y.o. year-old female with a history of diabetes, pancreatitis presenting to the ED with chief complaint of abdominal pain.  5 days of diffuse abdominal pain, nausea, vomiting, diarrhea.  Denies recent antibiotics, no blood in the vomit or the stool, no recent travel.  Pain initially in the right flank but has now become diffuse.  Seen in the ED 2 days ago with a normal work-up.  Denies fever.  No chest pain or shortness of breath.  No dysuria.  Symptoms moderate, constant, no exacerbating or alleviating factors.  Review of Systems  A complete 10 system review of systems was obtained and all systems are negative except as noted in the HPI and PMH.   Patient's Health History    Past Medical History:  Diagnosis Date  . Anxiety   . Arthritis    rheumatoid , osteoarthritis  . Bronchitis   . Chronic back pain   . Depression   . Diabetes mellitus   . Fatty liver   . Fibromyalgia   . GERD (gastroesophageal reflux disease)   . Headache   . Hypertension   . Pancreatitis   . Pneumonia   . Rotator cuff tear, right     Past Surgical History:  Procedure Laterality Date  . ABDOMINAL HYSTERECTOMY    . ANTERIOR CERVICAL DECOMP/DISCECTOMY FUSION N/A 07/21/2017   Procedure: ANTERIOR CERVICAL DISCECTOMY AND FUSION C6-7, REMOVAL OF SCREW C6 PLATE, DEPUY ZERO-P IMPLANT, LOCAL BONE GRAFT, ALLOGRAFT BONE GRAFT, VIVIGEN;  Surgeon: Jessy Oto, MD;  Location: [facility];  Service: Orthopedics;  Laterality: N/A;  . BACK SURGERY     laminectomy  . BONE GRAFT HIP ILIAC CREST    . BREAST BIOPSY    . CARPAL TUNNEL RELEASE    . CERVICAL DISCECTOMY  07/21/2017  . CESAREAN SECTION    . CHOLECYSTECTOMY    . COLONOSCOPY    . frontal fusion    . neck fusion     patient reported  . SHOULDER  ARTHROSCOPY WITH SUBACROMIAL DECOMPRESSION, ROTATOR CUFF REPAIR AND BICEP TENDON REPAIR Right 10/31/2017   Procedure: RIGHT SHOULDER ARTHROSCOPY, MANIPULATION UNDER ANESTHESIA, BICEPS TENODESIS, AND MINI OPEN ROTATOR CUFF TEAR REPAIR;  Surgeon: Meredith Pel, MD;  Location: [place];  Service: Orthopedics;  Laterality: Right;    Family History  Problem Relation Age of Onset  . Hypertension Father   . Renal Disease Father   . Diabetes Mother   . Hypertension Mother   . Edema Mother   . Diabetes Sister   . Diabetes Brother     Social History   Socioeconomic History  . Marital status: Legally Separated    Spouse name: Not on file  . Number of children: Not on file  . Years of education: Not on file  . Highest education level: Not on file  Occupational History  . Not on file  Social Needs  . Financial resource strain: Not on file  . Food insecurity    Worry: Not on file    Inability: Not on file  . Transportation needs    Medical: Not on file    Non-medical: Not on file  Tobacco Use  . Smoking status: Current Every Day Smoker    Packs/day: 0.50    Types: Cigarettes  . Smokeless tobacco:  Never Used  Substance and Sexual Activity  . Alcohol use: No  . Drug use: Yes    Types: Marijuana  . Sexual activity: Not on file  Lifestyle  . Physical activity    Days per week: Not on file    Minutes per session: Not on file  . Stress: Not on file  Relationships  . Social Herbalist on phone: Not on file    Gets together: Not on file    Attends religious service: Not on file    Active member of club or organization: Not on file    Attends meetings of clubs or organizations: Not on file    Relationship status: Not on file  . Intimate partner violence    Fear of current or ex partner: Not on file    Emotionally abused: Not on file    Physically abused: Not on file    Forced sexual activity: Not on file  Other Topics Concern  . Not on file  Social History Narrative   . Not on file     Physical Exam  Vital Signs and Nursing Notes reviewed Vitals:   07/14/19 1112  BP: (!) 156/88  Pulse: 88  Resp: 18  Temp: 98.3 F (36.8 C)  SpO2: 99%    CONSTITUTIONAL: Well-appearing, NAD NEURO:  Alert and oriented x 3, no focal deficits EYES:  eyes equal and reactive ENT/NECK:  no LAD, no JVD CARDIO: Regular rate, well-perfused, normal S1 and S2 PULM:  CTAB no wheezing or rhonchi GI/GU:  normal bowel sounds, non-distended, mild epigastric tenderness to palpation MSK/SPINE:  No gross deformities, no edema SKIN:  no rash, atraumatic PSYCH:  Appropriate speech and behavior  Diagnostic and Interventional Summary    EKG Interpretation  Date/Time:    Ventricular Rate:    PR Interval:    QRS Duration:   QT Interval:    QTC Calculation:   R Axis:     Text Interpretation:        Labs Reviewed  COMPREHENSIVE METABOLIC PANEL - Abnormal; Notable for the following components:      Result Value   Glucose, Bld 175 (*)    BUN 5 (*)    All other components within normal limits  CBC - Abnormal; Notable for the following components:   WBC 10.6 (*)    RBC 5.35 (*)    Hemoglobin 15.4 (*)    HCT 46.2 (*)    All other components within normal limits  URINALYSIS, ROUTINE W REFLEX MICROSCOPIC - Abnormal; Notable for the following components:   APPearance HAZY (*)    Glucose, UA 150 (*)    Hgb urine dipstick SMALL (*)    Ketones, ur 20 (*)    Bacteria, UA RARE (*)    All other components within normal limits  LIPASE, BLOOD  I-STAT BETA HCG BLOOD, ED ([facility], [facility], [facility] ONLY)    No orders to display    Medications  sodium chloride flush (NS) 0.9 % injection 3 mL (has no administration in time range)  haloperidol lactate (HALDOL) injection 5 mg (5 mg Intramuscular Given 07/14/19 1452)  alum & mag hydroxide-simeth (MAALOX/MYLANTA) 200-200-20 MG/5ML suspension 30 mL (30 mLs Oral Given 07/14/19 1451)    And  lidocaine (XYLOCAINE) 2 % viscous mouth solution 15 mL (15  mLs Oral Given 07/14/19 1451)     Procedures  /  Critical Care Procedures  ED Course and Medical Decision Making  I have reviewed the triage vital  signs and the nursing notes.  Pertinent labs & imaging results that were available during my care of the patient were reviewed by me and considered in my medical decision making (see below for details).     Suspect viral gastroenteritis in this 53 year old female with largely reassuring abdominal exam, 5 days of symptoms with vomiting and diarrhea.  Recent CT scan 2 days ago was unremarkable.  Labs today are reassuring.  Patient feeling too unwell to go home right now, will provide intramuscular Haldol and reassess.  Patient is declining Covid testing at this time, agrees to continue to home quarantine until she feels all the way better.  Signed out to oncoming provider at shift change.    Barth Kirks. Sedonia Small, MD [place] [email or web address]  Final Clinical Impressions(s) / ED Diagnoses     ICD-10-CM   1. Nausea vomiting and diarrhea  R11.2    R19.7   2. Generalized abdominal pain  R10.84   3. Gastroenteritis  K52.9     ED Discharge Orders    None       Discharge Instructions Discussed with and Provided to Patient:   Discharge Instructions   None       Maudie Flakes, MD 07/14/19 1455

## 2019-07-14 NOTE — ED Triage Notes (Signed)
Patient complains of ongoing abdominal pain with vomiting x 2 days. Reports being seen in ED last night for same with no relief. Patient complains of ongoing pain.

## 2019-11-21 ENCOUNTER — Emergency Department (HOSPITAL_COMMUNITY): Payer: Medicare Other

## 2019-11-21 ENCOUNTER — Inpatient Hospital Stay (HOSPITAL_COMMUNITY)
Admission: EM | Admit: 2019-11-21 | Discharge: 2019-11-24 | DRG: 392 | Disposition: A | Discharge status: Home / Self Care | Payer: Medicare Other | Attending: Internal Medicine | Admitting: Internal Medicine

## 2019-11-21 ENCOUNTER — Other Ambulatory Visit: Payer: Self-pay

## 2019-11-21 ENCOUNTER — Encounter (HOSPITAL_COMMUNITY): Payer: Self-pay | Admitting: *Deleted

## 2019-11-21 DIAGNOSIS — E785 Hyperlipidemia, unspecified: Secondary | ICD-10-CM | POA: Diagnosis present

## 2019-11-21 DIAGNOSIS — K760 Fatty (change of) liver, not elsewhere classified: Secondary | ICD-10-CM | POA: Diagnosis present

## 2019-11-21 DIAGNOSIS — K5900 Constipation, unspecified: Secondary | ICD-10-CM | POA: Diagnosis not present

## 2019-11-21 DIAGNOSIS — K861 Other chronic pancreatitis: Secondary | ICD-10-CM | POA: Diagnosis present

## 2019-11-21 DIAGNOSIS — F419 Anxiety disorder, unspecified: Secondary | ICD-10-CM | POA: Diagnosis present

## 2019-11-21 DIAGNOSIS — R1010 Upper abdominal pain, unspecified: Secondary | ICD-10-CM

## 2019-11-21 DIAGNOSIS — F17210 Nicotine dependence, cigarettes, uncomplicated: Secondary | ICD-10-CM | POA: Diagnosis present

## 2019-11-21 DIAGNOSIS — K859 Acute pancreatitis without necrosis or infection, unspecified: Secondary | ICD-10-CM | POA: Diagnosis not present

## 2019-11-21 DIAGNOSIS — E669 Obesity, unspecified: Secondary | ICD-10-CM | POA: Diagnosis present

## 2019-11-21 DIAGNOSIS — Z9049 Acquired absence of other specified parts of digestive tract: Secondary | ICD-10-CM

## 2019-11-21 DIAGNOSIS — G8929 Other chronic pain: Secondary | ICD-10-CM | POA: Diagnosis present

## 2019-11-21 DIAGNOSIS — Z833 Family history of diabetes mellitus: Secondary | ICD-10-CM

## 2019-11-21 DIAGNOSIS — M797 Fibromyalgia: Secondary | ICD-10-CM | POA: Diagnosis present

## 2019-11-21 DIAGNOSIS — R112 Nausea with vomiting, unspecified: Secondary | ICD-10-CM | POA: Diagnosis not present

## 2019-11-21 DIAGNOSIS — F329 Major depressive disorder, single episode, unspecified: Secondary | ICD-10-CM | POA: Diagnosis present

## 2019-11-21 DIAGNOSIS — M549 Dorsalgia, unspecified: Secondary | ICD-10-CM | POA: Diagnosis present

## 2019-11-21 DIAGNOSIS — A059 Bacterial foodborne intoxication, unspecified: Secondary | ICD-10-CM | POA: Diagnosis not present

## 2019-11-21 DIAGNOSIS — M1990 Unspecified osteoarthritis, unspecified site: Secondary | ICD-10-CM | POA: Diagnosis present

## 2019-11-21 DIAGNOSIS — R109 Unspecified abdominal pain: Secondary | ICD-10-CM | POA: Diagnosis present

## 2019-11-21 DIAGNOSIS — R1084 Generalized abdominal pain: Secondary | ICD-10-CM

## 2019-11-21 DIAGNOSIS — Z841 Family history of disorders of kidney and ureter: Secondary | ICD-10-CM

## 2019-11-21 DIAGNOSIS — R1013 Epigastric pain: Secondary | ICD-10-CM | POA: Diagnosis present

## 2019-11-21 DIAGNOSIS — A084 Viral intestinal infection, unspecified: Secondary | ICD-10-CM | POA: Diagnosis present

## 2019-11-21 DIAGNOSIS — Z6834 Body mass index (BMI) 34.0-34.9, adult: Secondary | ICD-10-CM

## 2019-11-21 DIAGNOSIS — E119 Type 2 diabetes mellitus without complications: Secondary | ICD-10-CM | POA: Diagnosis present

## 2019-11-21 DIAGNOSIS — F1290 Cannabis use, unspecified, uncomplicated: Secondary | ICD-10-CM | POA: Diagnosis present

## 2019-11-21 DIAGNOSIS — Z981 Arthrodesis status: Secondary | ICD-10-CM

## 2019-11-21 DIAGNOSIS — K219 Gastro-esophageal reflux disease without esophagitis: Secondary | ICD-10-CM | POA: Diagnosis present

## 2019-11-21 DIAGNOSIS — Z79899 Other long term (current) drug therapy: Secondary | ICD-10-CM

## 2019-11-21 DIAGNOSIS — Z8249 Family history of ischemic heart disease and other diseases of the circulatory system: Secondary | ICD-10-CM

## 2019-11-21 DIAGNOSIS — I10 Essential (primary) hypertension: Secondary | ICD-10-CM | POA: Diagnosis present

## 2019-11-21 DIAGNOSIS — Z794 Long term (current) use of insulin: Secondary | ICD-10-CM

## 2019-11-21 DIAGNOSIS — Z20822 Contact with and (suspected) exposure to covid-19: Secondary | ICD-10-CM | POA: Diagnosis present

## 2019-11-21 LAB — URINALYSIS, ROUTINE W REFLEX MICROSCOPIC
Bacteria, UA: NONE SEEN
Bilirubin Urine: NEGATIVE
Glucose, UA: >=500 mg/dL — AB
Hgb urine dipstick: NEGATIVE
Ketones, ur: 20 mg/dL — AB
Leukocytes,Ua: NEGATIVE
Nitrite: NEGATIVE
Protein, ur: NEGATIVE mg/dL
Specific Gravity, Urine: 1.01 (ref 1.005–1.030)
pH: 8 (ref 5.0–8.0)

## 2019-11-21 LAB — COMPREHENSIVE METABOLIC PANEL
ALT: 19 U/L (ref 0–44)
AST: 19 U/L (ref 15–41)
Albumin: 4.5 g/dL (ref 3.5–5.0)
Alkaline Phosphatase: 101 U/L (ref 38–126)
Anion gap: 10 (ref 5–15)
BUN: 13 mg/dL (ref 6–20)
CO2: 26 mmol/L (ref 22–32)
Calcium: 9.1 mg/dL (ref 8.9–10.3)
Chloride: 104 mmol/L (ref 98–111)
Creatinine, Ser: 0.56 mg/dL (ref 0.44–1.00)
GFR calc Af Amer: >60 mL/min (ref >60)
GFR calc non Af Amer: >60 mL/min (ref >60)
Glucose, Bld: 189 mg/dL — ABNORMAL HIGH (ref 70–99)
Potassium: 4 mmol/L (ref 3.5–5.1)
Sodium: 140 mmol/L (ref 135–145)
Total Bilirubin: 0.9 mg/dL (ref 0.3–1.2)
Total Protein: 8.2 g/dL — ABNORMAL HIGH (ref 6.5–8.1)

## 2019-11-21 LAB — CBC
HCT: 46.6 % — ABNORMAL HIGH (ref 36.0–46.0)
Hemoglobin: 14.9 g/dL (ref 12.0–15.0)
MCH: 27.4 pg (ref 26.0–34.0)
MCHC: 32 g/dL (ref 30.0–36.0)
MCV: 85.8 fL (ref 80.0–100.0)
Platelets: 392 10*3/uL (ref 150–400)
RBC: 5.43 MIL/uL — ABNORMAL HIGH (ref 3.87–5.11)
RDW: 14 % (ref 11.5–15.5)
WBC: 11.4 10*3/uL — ABNORMAL HIGH (ref 4.0–10.5)
nRBC: 0 % (ref 0.0–0.2)

## 2019-11-21 LAB — SARS CORONAVIRUS 2 (TAT 6-24 HRS): SARS Coronavirus 2: NEGATIVE

## 2019-11-21 LAB — LIPID PANEL
Cholesterol: 138 mg/dL (ref 0–200)
HDL: 46 mg/dL
LDL Cholesterol: 73 mg/dL (ref 0–99)
Total CHOL/HDL Ratio: 3 ratio
Triglycerides: 96 mg/dL
VLDL: 19 mg/dL (ref 0–40)

## 2019-11-21 LAB — GLUCOSE, CAPILLARY: Glucose-Capillary: 162 mg/dL — ABNORMAL HIGH (ref 70–99)

## 2019-11-21 LAB — LIPASE, BLOOD: Lipase: 108 U/L — ABNORMAL HIGH (ref 11–51)

## 2019-11-21 LAB — ETHANOL: Alcohol, Ethyl (B): <10 mg/dL

## 2019-11-21 MED ORDER — AMITRIPTYLINE HCL 10 MG PO TABS
10.0000 mg | ORAL_TABLET | Freq: Every day | ORAL | Status: DC
  Administered 2019-11-21 – 2019-11-23 (×3): 10 mg via ORAL
  Filled 2019-11-21 (×3): qty 1

## 2019-11-21 MED ORDER — ACETAMINOPHEN 650 MG RE SUPP: 650.0000 mg | Freq: Four times a day (QID) | RECTAL | Status: DC | PRN

## 2019-11-21 MED ORDER — SODIUM CHLORIDE 0.9 % IV BOLUS
1000.0000 mL | Freq: Once | INTRAVENOUS | Status: AC
  Administered 2019-11-21: 1000 mL via INTRAVENOUS

## 2019-11-21 MED ORDER — ALBUTEROL SULFATE (2.5 MG/3ML) 0.083% IN NEBU: 2.5000 mg | INHALATION_SOLUTION | Freq: Four times a day (QID) | RESPIRATORY_TRACT | Status: DC | PRN

## 2019-11-21 MED ORDER — ALBUTEROL SULFATE (2.5 MG/3ML) 0.083% IN NEBU: 3.0000 mL | INHALATION_SOLUTION | RESPIRATORY_TRACT | Status: DC | PRN

## 2019-11-21 MED ORDER — ALUM & MAG HYDROXIDE-SIMETH 200-200-20 MG/5ML PO SUSP
30.0000 mL | Freq: Once | ORAL | Status: AC
  Administered 2019-11-21: 30 mL via ORAL
  Filled 2019-11-21: qty 30

## 2019-11-21 MED ORDER — INSULIN ASPART 100 UNIT/ML ~~LOC~~ SOLN
0.0000 [IU] | SUBCUTANEOUS | Status: DC
  Administered 2019-11-21 – 2019-11-22 (×2): 3 [IU] via SUBCUTANEOUS
  Administered 2019-11-23: 2 [IU] via SUBCUTANEOUS
  Administered 2019-11-23 (×2): 3 [IU] via SUBCUTANEOUS
  Administered 2019-11-23 (×2): 2 [IU] via SUBCUTANEOUS

## 2019-11-21 MED ORDER — INSULIN GLARGINE 100 UNIT/ML ~~LOC~~ SOLN
20.0000 [IU] | Freq: Every day | SUBCUTANEOUS | Status: DC
  Administered 2019-11-21 – 2019-11-23 (×3): 20 [IU] via SUBCUTANEOUS
  Filled 2019-11-21 (×3): qty 0.2

## 2019-11-21 MED ORDER — ALPRAZOLAM 1 MG PO TABS: 1.0000 mg | ORAL_TABLET | Freq: Every day | ORAL | Status: DC | PRN

## 2019-11-21 MED ORDER — ONDANSETRON HCL 4 MG PO TABS: 4.0000 mg | ORAL_TABLET | Freq: Four times a day (QID) | ORAL | Status: DC | PRN

## 2019-11-21 MED ORDER — HALOPERIDOL LACTATE 5 MG/ML IJ SOLN
5.0000 mg | Freq: Once | INTRAMUSCULAR | Status: AC
  Administered 2019-11-21: 5 mg via INTRAVENOUS
  Filled 2019-11-21: qty 1

## 2019-11-21 MED ORDER — LORAZEPAM 2 MG/ML IJ SOLN: 0.5000 mg | INTRAMUSCULAR | Status: DC | PRN

## 2019-11-21 MED ORDER — MORPHINE SULFATE (PF) 4 MG/ML IV SOLN
4.0000 mg | Freq: Once | INTRAVENOUS | Status: AC
  Administered 2019-11-21: 4 mg via INTRAVENOUS
  Filled 2019-11-21: qty 1

## 2019-11-21 MED ORDER — SUCRALFATE 1 GM/10ML PO SUSP
1.0000 g | Freq: Three times a day (TID) | ORAL | Status: DC
  Administered 2019-11-21 – 2019-11-23 (×4): 1 g via ORAL
  Filled 2019-11-21 (×7): qty 10

## 2019-11-21 MED ORDER — IOHEXOL 300 MG/ML  SOLN
100.0000 mL | Freq: Once | INTRAMUSCULAR | Status: AC | PRN
  Administered 2019-11-21: 100 mL via INTRAVENOUS

## 2019-11-21 MED ORDER — LIDOCAINE VISCOUS HCL 2 % MT SOLN
15.0000 mL | Freq: Once | OROMUCOSAL | Status: AC
  Administered 2019-11-21: 15 mL via ORAL
  Filled 2019-11-21: qty 15

## 2019-11-21 MED ORDER — ONDANSETRON HCL 4 MG/2ML IJ SOLN
4.0000 mg | Freq: Once | INTRAMUSCULAR | Status: AC
  Administered 2019-11-21: 4 mg via INTRAVENOUS
  Filled 2019-11-21: qty 2

## 2019-11-21 MED ORDER — DICYCLOMINE HCL 20 MG PO TABS: 20.0000 mg | ORAL_TABLET | Freq: Three times a day (TID) | ORAL | Status: DC | PRN

## 2019-11-21 MED ORDER — PANTOPRAZOLE SODIUM 40 MG PO TBEC
40.0000 mg | DELAYED_RELEASE_TABLET | Freq: Two times a day (BID) | ORAL | Status: DC
  Administered 2019-11-21 – 2019-11-24 (×5): 40 mg via ORAL
  Filled 2019-11-21 (×5): qty 1

## 2019-11-21 MED ORDER — SODIUM CHLORIDE 0.9% FLUSH
3.0000 mL | Freq: Two times a day (BID) | INTRAVENOUS | Status: DC
  Administered 2019-11-21 – 2019-11-23 (×2): 3 mL via INTRAVENOUS

## 2019-11-21 MED ORDER — VILAZODONE HCL 40 MG PO TABS
40.0000 mg | ORAL_TABLET | Freq: Every day | ORAL | Status: DC
  Administered 2019-11-23 – 2019-11-24 (×2): 40 mg via ORAL
  Filled 2019-11-21 (×3): qty 1

## 2019-11-21 MED ORDER — ONDANSETRON HCL 4 MG/2ML IJ SOLN: 4.0000 mg | Freq: Four times a day (QID) | INTRAMUSCULAR | Status: DC | PRN

## 2019-11-21 MED ORDER — SIMVASTATIN 20 MG PO TABS
20.0000 mg | ORAL_TABLET | Freq: Every day | ORAL | Status: DC
  Administered 2019-11-23 – 2019-11-24 (×2): 20 mg via ORAL
  Filled 2019-11-21 (×2): qty 1

## 2019-11-21 MED ORDER — SODIUM CHLORIDE 0.9% FLUSH
3.0000 mL | Freq: Once | INTRAVENOUS | Status: AC
  Administered 2019-11-21: 3 mL via INTRAVENOUS

## 2019-11-21 MED ORDER — ACETAMINOPHEN 325 MG PO TABS: 650.0000 mg | ORAL_TABLET | Freq: Four times a day (QID) | ORAL | Status: DC | PRN

## 2019-11-21 MED ORDER — METOPROLOL SUCCINATE ER 50 MG PO TB24
50.0000 mg | ORAL_TABLET | Freq: Every day | ORAL | Status: DC
  Administered 2019-11-23 – 2019-11-24 (×2): 50 mg via ORAL
  Filled 2019-11-21 (×2): qty 1

## 2019-11-21 MED ORDER — SODIUM CHLORIDE 0.9 % IV SOLN: INTRAVENOUS | Status: DC

## 2019-11-21 MED ORDER — ENOXAPARIN SODIUM 40 MG/0.4ML ~~LOC~~ SOLN
40.0000 mg | SUBCUTANEOUS | Status: DC
  Administered 2019-11-22 – 2019-11-23 (×2): 40 mg via SUBCUTANEOUS
  Filled 2019-11-21 (×3): qty 0.4

## 2019-11-21 MED ORDER — ESCITALOPRAM OXALATE 10 MG PO TABS
10.0000 mg | ORAL_TABLET | Freq: Every day | ORAL | Status: DC
  Administered 2019-11-21 – 2019-11-24 (×3): 10 mg via ORAL
  Filled 2019-11-21 (×3): qty 1

## 2019-11-21 MED ORDER — HYDROMORPHONE HCL 1 MG/ML IJ SOLN
0.5000 mg | INTRAMUSCULAR | Status: DC | PRN
  Administered 2019-11-21 – 2019-11-23 (×6): 1 mg via INTRAVENOUS
  Filled 2019-11-21 (×6): qty 1

## 2019-11-21 MED ORDER — SODIUM CHLORIDE (PF) 0.9 % IJ SOLN
INTRAMUSCULAR | Status: AC
  Filled 2019-11-21: qty 50

## 2019-11-21 MED ORDER — METOCLOPRAMIDE HCL 10 MG PO TABS: 10.0000 mg | ORAL_TABLET | Freq: Four times a day (QID) | ORAL | Status: DC | PRN

## 2019-11-21 MED ORDER — PROMETHAZINE HCL 25 MG PO TABS: 25.0000 mg | ORAL_TABLET | Freq: Three times a day (TID) | ORAL | Status: DC | PRN

## 2019-11-21 NOTE — ED Notes (Signed)
Patient transported to CT 

## 2019-11-21 NOTE — ED Triage Notes (Signed)
Pt arrives c/o pancreatitis. Upper abdominal pain and vomiting for 2 days.

## 2019-11-21 NOTE — ED Provider Notes (Signed)
[facility] DEPT Provider Note   CSN: [id number] Arrival date & time: 11/21/19  0540     History Chief Complaint  Patient presents with  . Abdominal Pain    Burley Bartolucci is a 54 y.o. female.  54 y.o female with a PMH of Fatty liver, HTN, DM, pancreatitis presents to the ED with a chief complaint of epigastric abdominal pain x 2 days. She reports severe pain along the epigastric region, similar to her prior episodes of pancreatitis.  She reports taking ibuprofen 800 mg to help with the symptoms without improvement.  States that vomiting began today, had multiple episodes of nonbilious, nonbloody bloody emesis.  Does have a prior hospital visit for the same.  Last bowel movement was this morning, nonbloody.No fever, no story of alcohol abuse, no constipation, no recent travel or recent antibiotic treatment.   The history is provided by the patient and medical records.       Past Medical History:  Diagnosis Date  . Anxiety   . Arthritis    rheumatoid , osteoarthritis  . Bronchitis   . Chronic back pain   . Depression   . Diabetes mellitus   . Fatty liver   . Fibromyalgia   . GERD (gastroesophageal reflux disease)   . Headache   . Hypertension   . Pancreatitis   . Pneumonia   . Rotator cuff tear, right     Patient Active Problem List   Diagnosis Date Noted  . Complete tear of right rotator cuff 07/22/2017    Class: Chronic  . Retained orthopedic hardware 07/22/2017    Class: Chronic  . Cervical disc herniation 07/21/2017    Class: Acute  . Status post cervical spinal fusion 07/21/2017  . Chronic back pain     Past Surgical History:  Procedure Laterality Date  . ABDOMINAL HYSTERECTOMY    . ANTERIOR CERVICAL DECOMP/DISCECTOMY FUSION N/A 07/21/2017   Procedure: ANTERIOR CERVICAL DISCECTOMY AND FUSION C6-7, REMOVAL OF SCREW C6 PLATE, DEPUY ZERO-P IMPLANT, LOCAL BONE GRAFT, ALLOGRAFT BONE GRAFT, VIVIGEN;  Surgeon: Jessy Oto,  MD;  Location: [facility];  Service: Orthopedics;  Laterality: N/A;  . BACK SURGERY     laminectomy  . BONE GRAFT HIP ILIAC CREST    . BREAST BIOPSY    . CARPAL TUNNEL RELEASE    . CERVICAL DISCECTOMY  07/21/2017  . CESAREAN SECTION    . CHOLECYSTECTOMY    . COLONOSCOPY    . frontal fusion    . neck fusion     patient reported  . SHOULDER ARTHROSCOPY WITH SUBACROMIAL DECOMPRESSION, ROTATOR CUFF REPAIR AND BICEP TENDON REPAIR Right 10/31/2017   Procedure: RIGHT SHOULDER ARTHROSCOPY, MANIPULATION UNDER ANESTHESIA, BICEPS TENODESIS, AND MINI OPEN ROTATOR CUFF TEAR REPAIR;  Surgeon: Meredith Pel, MD;  Location: [facility];  Service: Orthopedics;  Laterality: Right;     OB History   No obstetric history on file.     Family History  Problem Relation Age of Onset  . Hypertension Father   . Renal Disease Father   . Diabetes Mother   . Hypertension Mother   . Edema Mother   . Diabetes Sister   . Diabetes Brother     Social History   Tobacco Use  . Smoking status: Current Every Day Smoker    Packs/day: 0.50    Types: Cigarettes  . Smokeless tobacco: Never Used  Substance Use Topics  . Alcohol use: No  . Drug use: Yes  Types: Marijuana    Home Medications Prior to Admission medications   Medication Sig Start Date End Date Taking? Authorizing Provider  albuterol (PROVENTIL HFA;VENTOLIN HFA) 108 (90 BASE) MCG/ACT inhaler Inhale 2 puffs into the lungs every 4 (four) hours as needed for wheezing.   Yes [provider]  albuterol (PROVENTIL) (2.5 MG/3ML) 0.083% nebulizer solution Take 2.5 mg by nebulization every 6 (six) hours as needed for wheezing or shortness of breath.   Yes [provider]  ALPRAZolam Duanne Moron) 1 MG tablet Take 1 mg by mouth daily as needed for anxiety. 10/23/19  Yes [provider]  amitriptyline (ELAVIL) 10 MG tablet TAKE 1 TABLET(10 MG) BY MOUTH AT BEDTIME Patient taking differently: Take 10 mg by mouth at bedtime.  08/07/18  Yes  Stanbery, Mary L, PA-C  AZOR 5-40 MG per tablet Take 1 tablet by mouth daily.  02/06/14  Yes [provider]  cyclobenzaprine (FLEXERIL) 10 MG tablet Take 1 tablet (10 mg total) by mouth 2 (two) times daily as needed for muscle spasms. 07/13/19  Yes Montine Circle, PA-C  DEXILANT 60 MG capsule Take 60 mg by mouth daily. 11/09/19  Yes [provider]  escitalopram (LEXAPRO) 10 MG tablet Take 10 mg by mouth daily. 11/11/19  Yes [provider]  insulin glargine (LANTUS) 100 unit/mL SOPN Inject 40 Units into the skin at bedtime.    Yes [provider]  JANUVIA 100 MG tablet Take 100 mg by mouth daily. 07/22/19  Yes [provider]  metFORMIN (GLUCOPHAGE) 1000 MG tablet Take 1,000 mg by mouth 2 (two) times daily. 07/29/17  Yes [provider]  metoprolol succinate (TOPROL-XL) 50 MG 24 hr tablet Take 50 mg by mouth daily. 07/21/14  Yes [provider]  promethazine (PHENERGAN) 25 MG tablet Take 1 tablet (25 mg total) by mouth every 8 (eight) hours as needed for nausea. 07/14/19  Yes Davonna Belling, MD  simvastatin (ZOCOR) 20 MG tablet Take 20 mg by mouth daily.    Yes [provider]  TRULICITY [id number] SOPN Inject 0.75 mg into the skin once a week. 11/06/19  Yes [provider]  VIIBRYD 40 MG TABS Take 40 mg by mouth daily. 06/03/19  Yes [provider]  Vitamin D, Ergocalciferol, (DRISDOL) 1.25 MG (50000 UNIT) CAPS capsule Take 50,000 Units by mouth once a week. 10/23/19  Yes [provider]  baclofen (LIORESAL) 10 MG tablet 1 po tid prn Patient not taking: Reported on 12/15/2018 11/13/17   Meredith Pel, MD  diazepam (VALIUM) 5 MG tablet Take one tablet po at the MRI scanner, may repeat x 1. Patient not taking: Reported on 12/11/2018 03/30/18   Jessy Oto, MD  diclofenac sodium (VOLTAREN) 1 % GEL Apply 2 g topically 4 (four) times daily. Patient not taking: Reported on 12/15/2018 04/22/18   Jessy Oto, MD  dicyclomine (BENTYL) 20 MG tablet Take 1 tablet (20 mg total) by mouth every 8 (eight) hours as needed for spasms (Abdominal cramping). Patient not taking: Reported on 11/21/2019 12/11/18   Ward, Delice Bison, DO  esomeprazole (NEXIUM) 40 MG capsule Take 1 capsule (40 mg total) by mouth daily. Patient not taking: Reported on 11/21/2019 12/20/18   Jacqlyn Larsen, PA-C  famotidine (PEPCID) 20 MG tablet Take 1 tablet (20 mg total) by mouth 2 (two) times daily. Patient not taking: Reported on 11/21/2019 12/15/18   Okey Regal, PA-C  gabapentin (NEURONTIN) 100 MG capsule Take 1 capsule (100 mg  total) by mouth at bedtime. Patient not taking: Reported on 12/15/2018 07/23/17   Jessy Oto, MD  HYDROcodone-acetaminophen (NORCO/VICODIN) 5-325 MG tablet Take 1 tablet by mouth every 6 (six) hours as needed for severe pain. Patient not taking: Reported on 11/21/2019 12/13/18   Antonietta Breach, PA-C  lidocaine (LIDODERM) 5 % Place 1 patch onto the skin daily. Remove & Discard patch within 12 hours or as directed by MD Patient not taking: Reported on 11/21/2019 07/13/19   Montine Circle, PA-C  lidocaine (XYLOCAINE) 2 % solution Use as directed 15 mLs in the mouth or throat as needed for mouth pain. Patient not taking: Reported on 12/11/2018 08/21/18   Larene Pickett, PA-C  loperamide (IMODIUM) 2 MG capsule Take 1 capsule (2 mg total) by mouth 4 (four) times daily as needed for diarrhea or loose stools. Patient not taking: Reported on 11/21/2019 12/11/18   Ward, Delice Bison, DO  methocarbamol (ROBAXIN) 500 MG tablet Take 1 tablet (500 mg total) by mouth 2 (two) times daily. Patient not taking: Reported on 12/11/2018 08/21/18   Larene Pickett, PA-C  metoCLOPramide (REGLAN) 10 MG tablet Take 1 tablet (10 mg total) by mouth every 6 (six) hours as needed for nausea or vomiting. Patient not taking: Reported on 12/15/2018 12/13/18   Antonietta Breach, PA-C  ondansetron (ZOFRAN ODT) 4 MG disintegrating tablet Take 1 tablet (4 mg  total) by mouth every 8 (eight) hours as needed. Patient not taking: Reported on 11/21/2019 07/13/19   Montine Circle, PA-C  oxycodone (OXY-IR) 5 MG capsule 1 to 2 po q 4hrs prn pnain Patient not taking: Reported on 12/11/2018 11/13/17   Meredith Pel, MD  oxyCODONE (ROXICODONE) 5 MG immediate release tablet 1-2 po q 6 hrs prn pain Patient not taking: Reported on 12/11/2018 12/11/17   Meredith Pel, MD  pantoprazole (PROTONIX) 20 MG tablet Take 1 tablet (20 mg total) by mouth daily. Patient not taking: Reported on 12/15/2018 12/13/18   Antonietta Breach, PA-C  sucralfate (CARAFATE) 1 GM/10ML suspension Take 10 mLs (1 g total) by mouth 4 (four) times daily -  with meals and at bedtime. Patient not taking: Reported on 11/21/2019 12/20/18   Jacqlyn Larsen, PA-C    Allergies    Patient has no known allergies.  Review of Systems   Review of Systems  Constitutional: Negative for fever.  HENT: Negative for sneezing and sore throat.   Respiratory: Negative for shortness of breath.   Cardiovascular: Negative for chest pain.  Gastrointestinal: Positive for abdominal pain, diarrhea, nausea and vomiting. Negative for blood in stool.  Genitourinary: Negative for flank pain.  Musculoskeletal: Negative for back pain.  Neurological: Negative for light-headedness and headaches.  All other systems reviewed and are negative.   Physical Exam Updated Vital Signs BP (!) 165/87   Pulse 73   Temp 98.1 F (36.7 C) (Oral)   Resp (!) 24   Ht 5\' 5"  (1.651 m)   Wt 90.7 kg   SpO2 90%   BMI 33.28 kg/m   Physical Exam Vitals and nursing note reviewed.  Constitutional:      Appearance: She is well-developed. She is ill-appearing.  HENT:     Head: Normocephalic and atraumatic.  Cardiovascular:     Rate and Rhythm: Normal rate.  Pulmonary:     Effort: Pulmonary effort is normal.     Breath sounds: No wheezing or rales.     Comments: No wheezing, rhonchi, or rales.  Abdominal:  General: Abdomen is  flat. Bowel sounds are decreased.     Palpations: Abdomen is soft.     Tenderness: There is abdominal tenderness in the right upper quadrant, epigastric area and left upper quadrant.     Hernia: No hernia is present.     Comments: Abdomen is diffusely distended throughout.  Significant pain along the epigastric region.  Bowel sounds are decreased.  Skin:    General: Skin is warm and dry.  Neurological:     Mental Status: She is alert and oriented to person, place, and time.     ED Results / Procedures / Treatments   Labs (all labs ordered are listed, but only abnormal results are displayed) Labs Reviewed  LIPASE, BLOOD - Abnormal; Notable for the following components:      Result Value   Lipase 108 (*)    All other components within normal limits  COMPREHENSIVE METABOLIC PANEL - Abnormal; Notable for the following components:   Glucose, Bld 189 (*)    Total Protein 8.2 (*)    All other components within normal limits  CBC - Abnormal; Notable for the following components:   WBC 11.4 (*)    RBC 5.43 (*)    HCT 46.6 (*)    All other components within normal limits  URINALYSIS, ROUTINE W REFLEX MICROSCOPIC - Abnormal; Notable for the following components:   Color, Urine STRAW (*)    Glucose, UA >=500 (*)    Ketones, ur 20 (*)    All other components within normal limits    EKG None  Radiology CT ABDOMEN PELVIS W CONTRAST  Result Date: 11/21/2019 CLINICAL DATA:  Abdominal pain, vomiting EXAM: CT ABDOMEN AND PELVIS WITH CONTRAST TECHNIQUE: Multidetector CT imaging of the abdomen and pelvis was performed using the standard protocol following bolus administration of intravenous contrast. CONTRAST:  163mL OMNIPAQUE IOHEXOL 300 MG/ML  SOLN COMPARISON:  07/13/2019 and previous FINDINGS: Lower chest: Linear scarring or subsegmental atelectasis in the lung bases left greater than right. No significant pleural or pericardial effusion. Hepatobiliary: No focal liver abnormality is seen.  Status post cholecystectomy. No biliary dilatation. Pancreas: Unremarkable. No pancreatic ductal dilatation or surrounding inflammatory changes. Spleen: Normal in size without focal abnormality. Adrenals/Urinary Tract: Right adrenal unremarkable. Bulbous left adrenal with punctate calcification, stable since 08/03/2014. Stomach/Bowel: Stomach and small bowel are nondistended. Normal appendix. Colon is nondilated, unremarkable. Vascular/Lymphatic: Minimal calcified aortic plaque. No aneurysm or stenosis. No abdominal or pelvic adenopathy. Reproductive: Status post hysterectomy. No adnexal masses. Other: No ascites. No free air. Musculoskeletal: Chronic atrophy in the inferior aspect of the left rectus musculature. Small umbilical hernia containing only mesenteric fat. Stable changes of instrumented fusion L4-S1. Mild narrowing of the L3-4 interspace. Bilateral hip DJD. No fracture or worrisome bone lesion. IMPRESSION: 1. No acute findings. 2. Stable postop and degenerative changes as above. Aortic Atherosclerosis (ICD10-I70.0). Electronically Signed   By: Lucrezia Europe M.D.   On: 11/21/2019 13:04    Procedures Procedures (including critical care time)  Medications Ordered in ED Medications  sodium chloride flush (NS) 0.9 % injection 3 mL (3 mLs Intravenous Given 11/21/19 0746)  sodium chloride 0.9 % bolus 1,000 mL (0 mLs Intravenous Stopped 11/21/19 1043)  morphine 4 MG/ML injection 4 mg (4 mg Intravenous Given 11/21/19 0738)  ondansetron (ZOFRAN) injection 4 mg (4 mg Intravenous Given 11/21/19 0738)  alum & mag hydroxide-simeth (MAALOX/MYLANTA) 200-200-20 MG/5ML suspension 30 mL (30 mLs Oral Given 11/21/19 0738)    And  lidocaine (XYLOCAINE) 2 %  viscous mouth solution 15 mL (15 mLs Oral Given 11/21/19 0738)  haloperidol lactate (HALDOL) injection 5 mg (5 mg Intravenous Given 11/21/19 1059)  iohexol (OMNIPAQUE) 300 MG/ML solution 100 mL (100 mLs Intravenous Contrast Given 11/21/19 1154)  sodium chloride (PF)  0.9 % injection (  Given by Other 11/21/19 1341)    ED Course  I have reviewed the triage vital signs and the nursing notes.  Pertinent labs & imaging results that were available during my care of the patient were reviewed by me and considered in my medical decision making (see chart for details).    MDM Rules/Calculators/A&P   Patient with a PMH of pancreatitis presents to the ED with a chief complaint of nausea, vomiting, and epigastric complaint.  Her symptoms have been ongoing for the past today, reports severe pain at home, has taken some ibuprofen without improvement in her symptoms.  Has had multiple episodes of nonbloody, nonbilious emesis.  Does have a prior history of pancreatitis, reports feeling similar as it is.  Her last bowel movement was this morning without any blood.  No alcohol abuse, no recent antibiotic treatment.  Patient received Zofran along with GI cocktail, morphine for symptomatic control.  During my evaluation patient appears very uncomfortable, teary-eyed on my exam, abdomen is soft, tender to palpation throughout, focal tenderness along the upper abdomen region.  Second round of pain medication was given this patient reports pain has not been controlled, provided with Haldol along with more nausea medication, states she has continued to vomit.  Interpretation of her labs showed a lipase of 108, this is significantly elevated from prior visits.  CBC with a mild leukocytosis of 11.4, she reports not having any fevers or chills at home.  CMP is within normal limits, LFTs are unremarkable, creatinine function is within normal limits.  Glucose is elevated, prior history of diabetes however no anion gap, ketones are present in her urine but doubt DKA at this time.  UA with ketones no nitrites or leukocytes.  CT abdomen and pelvis: without evidence of abscess, or acute pathology.   Patient had another episode of vomiting after receiving medication, reports pain has mildly  improved, she does have non-intractable vomiting at this time with pain that has not been controlled.  Will place hospitalist admission for further care.  This was discussed with patient prior to.  1:43 PM Spoke to hospitalist who is agreable of admission, patient stable for further care.   Portions of this note were generated with [employer]. Dictation errors may occur despite best attempts at proofreading.  Final Clinical Impression(s) / ED Diagnoses Final diagnoses:  Pain of upper abdomen  Non-intractable vomiting with nausea, unspecified vomiting type    Rx / DC Orders ED Discharge Orders    None       Janeece Fitting, PA-C 11/21/19 1344    Milton Ferguson, MD 11/22/19 1031

## 2019-11-21 NOTE — H&P (Signed)
History and Physical    [name] [id number] DOB: Mar 18, 1966 DOA: 11/21/2019  PCP: Nolene Ebbs, MD  Patient coming from: Home  I have personally briefly reviewed patient's old medical records in [place]  Chief Complaint: Nausea/vomiting, abdominal pain  HPI: [name] is a 54 y.o. female with medical history significant of anxiety, depression, arthritis, fibromyalgia, GERD, T2DM/IDDM, HTN, HLD, fatty liver and history of pancreatitis who presents with abdominal pain with nausea vomiting.  Patient reports she was in usual state of health up until 2 days ago when she developed sudden onset nausea/vomiting.  She reports accompanying epigastric pain and left-sided abdominal pain.  She self reports this is similar to prior episodes of pancreatitis that she has dealt with.  She reports that she initially was vomiting once every hour but it has been spaced out to every couple of hours in the last 24 hours.  She she has trialed ibuprofen for pain control.  This has not been helpful.  She drove herself in today.  She has not kept down any food the past couple of days.  Patient self reports intermittent diagnoses of pancreatitis.  It does appear this has been noted through several ER notes.  However chart review reveals patient have gallbladder out in 2012 for polyps and biliary dyskinesia possibly contributing at that time.  She had gastric emptying study done in 2010 which was normal.  She does not normally report pain with meals.  She reports only using ibuprofen when the pain began and not prior to onset.  No diarrhea, no bloody stools.  She denies any abnormal meals, doubts food poisoning.  She has not had any fevers chills sweats.  She does smoke half pack per day, denies any alcohol use, does use marijuana every other day   Review of Systems: As per HPI otherwise 10 point review of systems negative.   Past Medical History:  Diagnosis Date  . Anxiety   .  Arthritis    rheumatoid , osteoarthritis  . Bronchitis   . Chronic back pain   . Depression   . Diabetes mellitus   . Fatty liver   . Fibromyalgia   . GERD (gastroesophageal reflux disease)   . Headache   . Hypertension   . Pancreatitis   . Pneumonia   . Rotator cuff tear, right     Past Surgical History:  Procedure Laterality Date  . ABDOMINAL HYSTERECTOMY    . ANTERIOR CERVICAL DECOMP/DISCECTOMY FUSION N/A 07/21/2017   Procedure: ANTERIOR CERVICAL DISCECTOMY AND FUSION C6-7, REMOVAL OF SCREW C6 PLATE, DEPUY ZERO-P IMPLANT, LOCAL BONE GRAFT, ALLOGRAFT BONE GRAFT, VIVIGEN;  Surgeon: Jessy Oto, MD;  Location: [facility];  Service: Orthopedics;  Laterality: N/A;  . BACK SURGERY     laminectomy  . BONE GRAFT HIP ILIAC CREST    . BREAST BIOPSY    . CARPAL TUNNEL RELEASE    . CERVICAL DISCECTOMY  07/21/2017  . CESAREAN SECTION    . CHOLECYSTECTOMY    . COLONOSCOPY    . frontal fusion    . neck fusion     patient reported  . SHOULDER ARTHROSCOPY WITH SUBACROMIAL DECOMPRESSION, ROTATOR CUFF REPAIR AND BICEP TENDON REPAIR Right 10/31/2017   Procedure: RIGHT SHOULDER ARTHROSCOPY, MANIPULATION UNDER ANESTHESIA, BICEPS TENODESIS, AND MINI OPEN ROTATOR CUFF TEAR REPAIR;  Surgeon: Meredith Pel, MD;  Location: [facility];  Service: Orthopedics;  Laterality: Right;     reports that she has been smoking cigarettes. She has  been smoking about 0.50 packs per day. She has never used smokeless tobacco. She reports current drug use. Drug: Marijuana. She reports that she does not drink alcohol.  No Known Allergies  Family History  Problem Relation Age of Onset  . Hypertension Father   . Renal Disease Father   . Diabetes Mother   . Hypertension Mother   . Edema Mother   . Diabetes Sister   . Diabetes Brother      Prior to Admission medications   Medication Sig Start Date End Date Taking? Authorizing Provider  albuterol (PROVENTIL HFA;VENTOLIN HFA) 108 (90 BASE) MCG/ACT inhaler Inhale  2 puffs into the lungs every 4 (four) hours as needed for wheezing.   Yes [provider]  albuterol (PROVENTIL) (2.5 MG/3ML) 0.083% nebulizer solution Take 2.5 mg by nebulization every 6 (six) hours as needed for wheezing or shortness of breath.   Yes [provider]  ALPRAZolam Duanne Moron) 1 MG tablet Take 1 mg by mouth daily as needed for anxiety. 10/23/19  Yes [provider]  amitriptyline (ELAVIL) 10 MG tablet TAKE 1 TABLET(10 MG) BY MOUTH AT BEDTIME Patient taking differently: Take 10 mg by mouth at bedtime.  08/07/18  Yes Stanbery, Mary L, PA-C  AZOR 5-40 MG per tablet Take 1 tablet by mouth daily.  02/06/14  Yes [provider]  cyclobenzaprine (FLEXERIL) 10 MG tablet Take 1 tablet (10 mg total) by mouth 2 (two) times daily as needed for muscle spasms. 07/13/19  Yes Montine Circle, PA-C  DEXILANT 60 MG capsule Take 60 mg by mouth daily. 11/09/19  Yes [provider]  escitalopram (LEXAPRO) 10 MG tablet Take 10 mg by mouth daily. 11/11/19  Yes [provider]  insulin glargine (LANTUS) 100 unit/mL SOPN Inject 40 Units into the skin at bedtime.    Yes [provider]  JANUVIA 100 MG tablet Take 100 mg by mouth daily. 07/22/19  Yes [provider]  metFORMIN (GLUCOPHAGE) 1000 MG tablet Take 1,000 mg by mouth 2 (two) times daily. 07/29/17  Yes [provider]  metoprolol succinate (TOPROL-XL) 50 MG 24 hr tablet Take 50 mg by mouth daily. 07/21/14  Yes [provider]  promethazine (PHENERGAN) 25 MG tablet Take 1 tablet (25 mg total) by mouth every 8 (eight) hours as needed for nausea. 07/14/19  Yes Davonna Belling, MD  simvastatin (ZOCOR) 20 MG tablet Take 20 mg by mouth daily.    Yes [provider]  TRULICITY [id number] SOPN Inject 0.75 mg into the skin once a week. 11/06/19  Yes [provider]  VIIBRYD 40 MG TABS Take 40 mg by mouth daily. 06/03/19  Yes [provider]  Vitamin D,  Ergocalciferol, (DRISDOL) 1.25 MG (50000 UNIT) CAPS capsule Take 50,000 Units by mouth once a week. 10/23/19  Yes [provider]  baclofen (LIORESAL) 10 MG tablet 1 po tid prn Patient not taking: Reported on 12/15/2018 11/13/17   Meredith Pel, MD  diazepam (VALIUM) 5 MG tablet Take one tablet po at the MRI scanner, may repeat x 1. Patient not taking: Reported on 12/11/2018 03/30/18   Jessy Oto, MD  diclofenac sodium (VOLTAREN) 1 % GEL Apply 2 g topically 4 (four) times daily. Patient not taking: Reported on 12/15/2018 04/22/18   Jessy Oto, MD  dicyclomine (BENTYL) 20 MG tablet Take 1 tablet (20 mg total) by mouth every 8 (eight) hours as needed for spasms (Abdominal cramping). Patient not taking: Reported on 11/21/2019 12/11/18  Ward, Delice Bison, DO  esomeprazole (NEXIUM) 40 MG capsule Take 1 capsule (40 mg total) by mouth daily. Patient not taking: Reported on 11/21/2019 12/20/18   Jacqlyn Larsen, PA-C  famotidine (PEPCID) 20 MG tablet Take 1 tablet (20 mg total) by mouth 2 (two) times daily. Patient not taking: Reported on 11/21/2019 12/15/18   Okey Regal, PA-C  gabapentin (NEURONTIN) 100 MG capsule Take 1 capsule (100 mg total) by mouth at bedtime. Patient not taking: Reported on 12/15/2018 07/23/17   Jessy Oto, MD  HYDROcodone-acetaminophen (NORCO/VICODIN) 5-325 MG tablet Take 1 tablet by mouth every 6 (six) hours as needed for severe pain. Patient not taking: Reported on 11/21/2019 12/13/18   Antonietta Breach, PA-C  lidocaine (LIDODERM) 5 % Place 1 patch onto the skin daily. Remove & Discard patch within 12 hours or as directed by MD Patient not taking: Reported on 11/21/2019 07/13/19   Montine Circle, PA-C  lidocaine (XYLOCAINE) 2 % solution Use as directed 15 mLs in the mouth or throat as needed for mouth pain. Patient not taking: Reported on 12/11/2018 08/21/18   Larene Pickett, PA-C  loperamide (IMODIUM) 2 MG capsule Take 1 capsule (2 mg total) by mouth 4 (four) times daily as  needed for diarrhea or loose stools. Patient not taking: Reported on 11/21/2019 12/11/18   Ward, Delice Bison, DO  methocarbamol (ROBAXIN) 500 MG tablet Take 1 tablet (500 mg total) by mouth 2 (two) times daily. Patient not taking: Reported on 12/11/2018 08/21/18   Larene Pickett, PA-C  metoCLOPramide (REGLAN) 10 MG tablet Take 1 tablet (10 mg total) by mouth every 6 (six) hours as needed for nausea or vomiting. Patient not taking: Reported on 12/15/2018 12/13/18   Antonietta Breach, PA-C  ondansetron (ZOFRAN ODT) 4 MG disintegrating tablet Take 1 tablet (4 mg total) by mouth every 8 (eight) hours as needed. Patient not taking: Reported on 11/21/2019 07/13/19   Montine Circle, PA-C  oxycodone (OXY-IR) 5 MG capsule 1 to 2 po q 4hrs prn pnain Patient not taking: Reported on 12/11/2018 11/13/17   Meredith Pel, MD  oxyCODONE (ROXICODONE) 5 MG immediate release tablet 1-2 po q 6 hrs prn pain Patient not taking: Reported on 12/11/2018 12/11/17   Meredith Pel, MD  pantoprazole (PROTONIX) 20 MG tablet Take 1 tablet (20 mg total) by mouth daily. Patient not taking: Reported on 12/15/2018 12/13/18   Antonietta Breach, PA-C  sucralfate (CARAFATE) 1 GM/10ML suspension Take 10 mLs (1 g total) by mouth 4 (four) times daily -  with meals and at bedtime. Patient not taking: Reported on 11/21/2019 12/20/18   Jacqlyn Larsen, [place]    Physical Exam: Vitals:   11/21/19 0945 11/21/19 1000 11/21/19 1041 11/21/19 1241  BP:  (!) 179/95 (!) 153/79 (!) 148/72  Pulse: 71 70 71 71  Resp: (!) 26 (!) 26 (!) 24 (!) 21  Temp:      TempSrc:      SpO2: 90% (!) 89% 96% 93%  Weight:      Height:        Vitals:   11/21/19 0945 11/21/19 1000 11/21/19 1041 11/21/19 1241  BP:  (!) 179/95 (!) 153/79 (!) 148/72  Pulse: 71 70 71 71  Resp: (!) 26 (!) 26 (!) 24 (!) 21  Temp:      TempSrc:      SpO2: 90% (!) 89% 96% 93%  Weight:      Height:         Constitutional:  Lights out in room, patient looks tired, eyes closed but otherwise no acute  distress Eyes: PERRL, EOMI ENMT: Mucous membranes are moist. Posterior pharynx clear of any exudate or lesions.Normal dentition.  Neck: normal, supple, no masses, no thyromegaly Respiratory: clear to auscultation bilaterally, no wheezing, no crackles. Normal respiratory effort. No accessory muscle use.  Cardiovascular: Regular rate and rhythm, no murmurs / rubs / gallops. No extremity edema. 2+ pedal pulses. Abdomen: Tender to epigastrium and left upper quadrant, no masses, no rebound or guarding Musculoskeletal: no clubbing / cyanosis. No joint deformity upper and lower extremities. Good ROM, no contractures. Normal muscle tone.  Skin: no rashes, lesions, ulcers. No induration Neurologic: CN 2-12 grossly intact.  Strength and sensation grossly intact Psychiatric: Normal judgment and insight. Alert and oriented x 3. Normal mood.     Labs on Admission: I have personally reviewed following labs and imaging studies  CBC: Recent Labs  Lab 11/21/19 0555  WBC 11.4*  HGB 14.9  HCT 46.6*  MCV 85.8  PLT 0000000   Basic Metabolic Panel: Recent Labs  Lab 11/21/19 0555  NA 140  K 4.0  CL 104  CO2 26  GLUCOSE 189*  BUN 13  CREATININE 0.56  CALCIUM 9.1   GFR: Estimated Creatinine Clearance: 89.5 mL/min (by C-G formula based on SCr of 0.56 mg/dL). Liver Function Tests: Recent Labs  Lab 11/21/19 0555  AST 19  ALT 19  ALKPHOS 101  BILITOT 0.9  PROT 8.2*  ALBUMIN 4.5   Recent Labs  Lab 11/21/19 0555  LIPASE 108*   No results for input(s): AMMONIA in the last 168 hours. Coagulation Profile: No results for input(s): INR, PROTIME in the last 168 hours. Cardiac Enzymes: No results for input(s): CKTOTAL, CKMB, CKMBINDEX, TROPONINI in the last 168 hours. BNP (last 3 results) No results for input(s): PROBNP in the last 8760 hours. HbA1C: No results for input(s): HGBA1C in the last 72 hours. CBG: No results for input(s): GLUCAP in the last 168 hours. Lipid Profile: No results  for input(s): CHOL, HDL, LDLCALC, TRIG, CHOLHDL, LDLDIRECT in the last 72 hours. Thyroid Function Tests: No results for input(s): TSH, T4TOTAL, FREET4, T3FREE, THYROIDAB in the last 72 hours. Anemia Panel: No results for input(s): VITAMINB12, FOLATE, FERRITIN, TIBC, IRON, RETICCTPCT in the last 72 hours. Urine analysis:    Component Value Date/Time   COLORURINE STRAW (A) 11/21/2019 1039   APPEARANCEUR CLEAR 11/21/2019 1039   LABSPEC 1.010 11/21/2019 1039   PHURINE 8.0 11/21/2019 1039   GLUCOSEU >=500 (A) 11/21/2019 1039   HGBUR NEGATIVE 11/21/2019 1039   BILIRUBINUR NEGATIVE 11/21/2019 1039   KETONESUR 20 (A) 11/21/2019 1039   PROTEINUR NEGATIVE 11/21/2019 1039   UROBILINOGEN 0.2 06/26/2015 1300   NITRITE NEGATIVE 11/21/2019 1039   LEUKOCYTESUR NEGATIVE 11/21/2019 1039    Radiological Exams on Admission: CT ABDOMEN PELVIS W CONTRAST  Result Date: 11/21/2019 CLINICAL DATA:  Abdominal pain, vomiting EXAM: CT ABDOMEN AND PELVIS WITH CONTRAST TECHNIQUE: Multidetector CT imaging of the abdomen and pelvis was performed using the standard protocol following bolus administration of intravenous contrast. CONTRAST:  122mL OMNIPAQUE IOHEXOL 300 MG/ML  SOLN COMPARISON:  07/13/2019 and previous FINDINGS: Lower chest: Linear scarring or subsegmental atelectasis in the lung bases left greater than right. No significant pleural or pericardial effusion. Hepatobiliary: No focal liver abnormality is seen. Status post cholecystectomy. No biliary dilatation. Pancreas: Unremarkable. No pancreatic ductal dilatation or surrounding inflammatory changes. Spleen: Normal in size without focal abnormality. Adrenals/Urinary Tract: Right adrenal unremarkable. Bulbous  left adrenal with punctate calcification, stable since 08/03/2014. Stomach/Bowel: Stomach and small bowel are nondistended. Normal appendix. Colon is nondilated, unremarkable. Vascular/Lymphatic: Minimal calcified aortic plaque. No aneurysm or stenosis. No  abdominal or pelvic adenopathy. Reproductive: Status post hysterectomy. No adnexal masses. Other: No ascites. No free air. Musculoskeletal: Chronic atrophy in the inferior aspect of the left rectus musculature. Small umbilical hernia containing only mesenteric fat. Stable changes of instrumented fusion L4-S1. Mild narrowing of the L3-4 interspace. Bilateral hip DJD. No fracture or worrisome bone lesion. IMPRESSION: 1. No acute findings. 2. Stable postop and degenerative changes as above. Aortic Atherosclerosis (ICD10-I70.0). Electronically Signed   By: Lucrezia Europe M.D.   On: 11/21/2019 13:04    EKG: Independently reviewed.   Assessment/Plan Jayanna Every is a 54 y.o. female with medical history significant of anxiety, depression, arthritis, fibromyalgia, GERD, T2DM/IDDM, HTN, HLD, fatty liver and history of pancreatitis who presents with abdominal pain with nausea vomiting.  #Acute/refractory nausea vomiting - pancreatitis vs cannabis hyperemesis vs less likely gastroparesis, infection -Patient carries a diagnosis of pancreatitis, and her lipase is elevated though less than 3 times upper limit of normal.  While possible her CT scan is unrevealing, self-reports she does not use alcohol, no history of hypertriglyceridemia, no history of gallstones and has had a cholecystectomy in 2012 and prior to that her lipases are more convincing and consistent with pancreatitis.  On imaging the pancreas itself looks unremarkable no calcifications to suggest multiple bouts of chronic pancreatitis leading to lower than expected levels.  Doubtful of hereditary pancreatitis given lack of family history, divisum is a consideration.  Retained stone also seems unlikely given the initial cholecystectomy was not done for gallstones.  LFTs also nonobstructive.  Besides pancreatitis consideration should be given to cannabis hyperemesis syndrome as the patient does utilizes every other day.  She has never been off of it for  extended period time.  She is a diabetic but no history of gastroparesis and last gastric emptying study was done in 2010 which was normal.  She is on multiple medications denies current opiate use but some of these may be contributing.  She self-reports EGD which was normal.  - UA wnl, denies symptoms, AG wnl not in DKA -Continue IV fluids, pain control -Continue PPI, antiemetics; as needed Ativan if other agents fail -will obtain lipid panel to evaluate triglycerides  -Encouraged abstaining from cannabis -N.p.o., advance diet as tolerated  #Leukocytosis -Mild, suspect stress demargination  #IDDM/T2DM  -Continue dose reduced Lantus at 20 units -held oral agents -ISS and CBGs  #Hyperlipidemia -Continue simvastatin  #Hypertension -Continue metoprolol  #Anxiety and depression -Continue Viibryd, escitalopram and amitriptyline  #Hepatic steatosis -Encouraged weight loss  #GERD -Continue PPI  #Tobacco use disorder -Counseled, patient has not really thought to cut back  DVT prophylaxis: Lovenox Code Status: Full Admission status: Observation   Truddie Hidden MD [facility] Pager [phone]  If 7PM-7AM, please contact night-coverage [email or web address] Password [facility]  11/21/2019, 1:41 PM

## 2019-11-21 NOTE — ED Provider Notes (Signed)
ED ECG REPORT   Date: 11/21/2019  Rate: 78  Rhythm: normal sinus rhythm  QRS Axis: normal  Intervals: normal  ST/T Wave abnormalities: normal  Conduction Disutrbances:PRWP  Narrative Interpretation:   Old EKG Reviewed: none available  I have personally reviewed the EKG tracing and agree  with the computerized printout as noted.  Rolland Porter, MD, Barbette Or, MD 11/21/19 [phone]

## 2019-11-21 NOTE — ED Notes (Signed)
Back from CT

## 2019-11-22 DIAGNOSIS — R112 Nausea with vomiting, unspecified: Secondary | ICD-10-CM

## 2019-11-22 DIAGNOSIS — R1084 Generalized abdominal pain: Secondary | ICD-10-CM

## 2019-11-22 LAB — BASIC METABOLIC PANEL
Anion gap: 12 (ref 5–15)
BUN: 9 mg/dL (ref 6–20)
CO2: 25 mmol/L (ref 22–32)
Calcium: 8.9 mg/dL (ref 8.9–10.3)
Chloride: 101 mmol/L (ref 98–111)
Creatinine, Ser: 0.54 mg/dL (ref 0.44–1.00)
GFR calc Af Amer: >60 mL/min (ref >60)
GFR calc non Af Amer: >60 mL/min (ref >60)
Glucose, Bld: 132 mg/dL — ABNORMAL HIGH (ref 70–99)
Potassium: 3.7 mmol/L (ref 3.5–5.1)
Sodium: 138 mmol/L (ref 135–145)

## 2019-11-22 LAB — GLUCOSE, CAPILLARY
Glucose-Capillary: 143 mg/dL — ABNORMAL HIGH (ref 70–99)
Glucose-Capillary: 145 mg/dL — ABNORMAL HIGH (ref 70–99)
Glucose-Capillary: 124 mg/dL — ABNORMAL HIGH (ref 70–99)
Glucose-Capillary: 153 mg/dL — ABNORMAL HIGH (ref 70–99)
Glucose-Capillary: 108 mg/dL — ABNORMAL HIGH (ref 70–99)
Glucose-Capillary: 108 mg/dL — ABNORMAL HIGH (ref 70–99)
Glucose-Capillary: 156 mg/dL — ABNORMAL HIGH (ref 70–99)

## 2019-11-22 LAB — CBC
HCT: 44.8 % (ref 36.0–46.0)
Hemoglobin: 14.6 g/dL (ref 12.0–15.0)
MCH: 27.7 pg (ref 26.0–34.0)
MCHC: 32.6 g/dL (ref 30.0–36.0)
MCV: 84.8 fL (ref 80.0–100.0)
Platelets: 353 10*3/uL (ref 150–400)
RBC: 5.28 MIL/uL — ABNORMAL HIGH (ref 3.87–5.11)
RDW: 13.7 % (ref 11.5–15.5)
WBC: 13.4 10*3/uL — ABNORMAL HIGH (ref 4.0–10.5)
nRBC: 0 % (ref 0.0–0.2)

## 2019-11-22 LAB — HIV ANTIBODY (ROUTINE TESTING W REFLEX): HIV Screen 4th Generation wRfx: NONREACTIVE

## 2019-11-22 NOTE — Progress Notes (Signed)
PROGRESS NOTE    [name]  [id number] DOB: 20-Mar-1966 DOA: 11/21/2019 PCP: Nolene Ebbs, MD   Brief Narrative:  HPI per Dr. Truddie Hidden on 11/21/19 [name] is a 54 y.o. female with medical history significant of anxiety, depression, arthritis, fibromyalgia, GERD, T2DM/IDDM, HTN, HLD, fatty liver and history of pancreatitis who presents with abdominal pain with nausea vomiting.  Patient reports she was in usual state of health up until 2 days ago when she developed sudden onset nausea/vomiting.  She reports accompanying epigastric pain and left-sided abdominal pain.  She self reports this is similar to prior episodes of pancreatitis that she has dealt with.  She reports that she initially was vomiting once every hour but it has been spaced out to every couple of hours in the last 24 hours.  She she has trialed ibuprofen for pain control.  This has not been helpful.  She drove herself in today.  She has not kept down any food the past couple of days.  Patient self reports intermittent diagnoses of pancreatitis.  It does appear this has been noted through several ER notes.  However chart review reveals patient have gallbladder out in 2012 for polyps and biliary dyskinesia possibly contributing at that time.  She had gastric emptying study done in 2010 which was normal.  She does not normally report pain with meals.  She reports only using ibuprofen when the pain began and not prior to onset.  No diarrhea, no bloody stools.  She denies any abnormal meals, doubts food poisoning.  She has not had any fevers chills sweats.  She does smoke half pack per day, denies any alcohol use, does use marijuana every other day  **Interim History  Continue to have some abdominal pain but is not as nauseous and asking for clear liquid diet.  No chest pain, lightheadedness or dizziness.  No other concerns or complaints at this time.  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   -While possible her CT scan is unrevealing, self-reports she does not use alcohol, no history of hypertriglyceridemia, no history of gallstones and has had a cholecystectomy in 2012 and prior to that her lipases not convincing and consistent with pancreatitis.   -On imaging the pancreas itself looks unremarkable no calcifications to suggest multiple bouts of chronic pancreatitis leading to lower than expected levels.   -Doubtful of hereditary pancreatitis given lack of family history, divisum is a consideration.   -Retained stone also seems unlikely given the initial cholecystectomy was not done for gallstones.  LFTs also nonobstructive.   -Besides pancreatitis consideration should be given to cannabis hyperemesis syndrome as the patient does utilizes every other day.   -She has never been off of it for extended period time.   -She is a diabetic but no history of gastroparesis and last gastric emptying study was done in 2010 which was normal.   -She is on multiple medications denies current opiate use but some of these may be contributing.   -She self-reports EGD which was normal.  - UA wnl, denies symptoms, AG wnl not in DKA -Continue IV fluids, pain control -Continue PPI reduce fluid rates, antiemetics; as needed Ativan if other agents fail -will obtain lipid panel  to evaluate triglycerides; this was normal as triglyceride levels were 96 -Encouraged abstaining from cannabis -N.p.o., advance diet as tolerated and is now on a clear liquid diet -Treat with antiemetics and continue monitor response to intervention -Continues to have some abdominal pain but CT of the abdomen pelvis showed "No  acute findings. 2. Stable postop and degenerative changes as above."  Leukocytosis -Mild, suspect stress demargination slightly worsened -Reduced IV fluid rate to 75 MLS per hour Frequently monitor for signs or symptoms of infection -Repeat CBC in a.m.  IDDM/T2DM  -Continue dose reduced Lantus at 20 units -held oral agents -ISS and CBGs -Continue monitor CBGs carefully; CBGs ranging from 108-1 53  Hyperlipidemia -Continue simvastatin  Hypertension -Continue metoprolol succinate 50 mg p.o. daily  Anxiety and Depression -Continue Viibryd, escitalopram and amitriptyline -Tinea with lorazepam 0.5 mg IV every 4 as needed for refractory nausea and vomiting and continue with alprazolam 1 mg p.o. daily as needed anxiety  Hepatic Steatosis -Continue to  Encourage weight loss  GERD -Continue PPI  Tobacco use disorder -Smoking Cessastion Counseling given -Patient has not really thought to cut back  Obesity -Estimated body mass index is 34.45 kg/m as calculated from the following:   Height as of this encounter: 5\' 5"  (1.651 m).   Weight as of this encounter: 93.9 kg. -Weight Loss and Dietary Counseling given   DVT prophylaxis: Enoxaparin 40 mg sq q24h Code Status: FULL CODE  Family Communication: No family present at bedside  Disposition Plan: Pending Further improvement in Nasuea and Vomiting and Tolerance of Diet   Consultants:   None   Procedures: None   Antimicrobials:  Anti-infectives (From admission, onward)   None     Subjective: And examined at bedside states that she is feeling a little bit better and she was still mildly nauseous but not as much.  Still continues have some abdominal pain.  Felt okay.  Denies any chest pain, heaviness or dizziness.  No other concerns or complaints at this time.  Objective: Vitals:   11/22/19 0019 11/22/19 0415 11/22/19 0532 11/22/19 1415  BP: (!) 155/76 (!) 146/72 (!) 165/88 (!) 154/79  Pulse: 77 74 74 66   Resp: 16 16 16 18   Temp: 98.9 F (37.2 C) 98.6 F (37 C) 98.5 F (36.9 C) 98.5 F (36.9 C)  TempSrc: Oral Oral Oral Oral  SpO2: 96% 96% 94% 93%  Weight:      Height:        Intake/Output Summary (Last 24 hours) at 11/22/2019 1945 Last data filed at 11/22/2019 1144 Gross per 24 hour  Intake 2936.61 ml  Output --  Net 2936.61 ml   Filed Weights   11/21/19 0550 11/21/19 1528  Weight: 90.7 kg 93.9 kg   Examination: Physical Exam:  Constitutional: WN/WD obese African-American female currently in NAD and appears calm and in bed laying slightly uncomfortable Eyes: Lids and conjunctivae normal, sclerae anicteric  ENMT: External Ears, Nose appear normal. Grossly normal hearing.  Neck: Appears normal, supple, no cervical masses, normal ROM, no appreciable thyromegaly; no JVD Respiratory: Diminished to auscultation bilaterally, no wheezing, rales, rhonchi or crackles. Normal respiratory effort and patient is not tachypenic. No accessory muscle use.  Unlabored breathing Cardiovascular: RRR, no murmurs / rubs / gallops. S1 and S2 auscultated.  Trace extremity edema.  Abdomen: Soft, tender to palpate, distended second body habitus. Bowel sounds positive.  GU: Deferred. Musculoskeletal: No clubbing / cyanosis of digits/nails. No joint deformity upper and lower extremities.  Skin: No  rashes, lesions, ulcers on limited skin evaluation. No induration; Warm and dry.  Neurologic: CN 2-12 grossly intact with no focal deficits. Romberg sign and cerebellar reflexes not assessed.  Psychiatric: Normal judgment and insight. Alert and oriented x 3. Normal mood and appropriate affect.   Data Reviewed: I have personally reviewed following labs and imaging studies  CBC: Recent Labs  Lab 11/21/19 0555 11/22/19 0541  WBC 11.4* 13.4*  HGB 14.9 14.6  HCT 46.6* 44.8  MCV 85.8 84.8  PLT 392 0000000   Basic Metabolic Panel: Recent Labs  Lab 11/21/19 0555 11/22/19 0541  NA 140 138  K 4.0 3.7  CL 104  101  CO2 26 25  GLUCOSE 189* 132*  BUN 13 9  CREATININE 0.56 0.54  CALCIUM 9.1 8.9   GFR: Estimated Creatinine Clearance: 91.1 mL/min (by C-G formula based on SCr of 0.54 mg/dL). Liver Function Tests: Recent Labs  Lab 11/21/19 0555  AST 19  ALT 19  ALKPHOS 101  BILITOT 0.9  PROT 8.2*  ALBUMIN 4.5   Recent Labs  Lab 11/21/19 0555  LIPASE 108*   No results for input(s): AMMONIA in the last 168 hours. Coagulation Profile: No results for input(s): INR, PROTIME in the last 168 hours. Cardiac Enzymes: No results for input(s): CKTOTAL, CKMB, CKMBINDEX, TROPONINI in the last 168 hours. BNP (last 3 results) No results for input(s): PROBNP in the last 8760 hours. HbA1C: No results for input(s): HGBA1C in the last 72 hours. CBG: Recent Labs  Lab 11/22/19 0013 11/22/19 0423 11/22/19 0744 11/22/19 1156 11/22/19 1659  GLUCAP 143* 124* 153* 108* 108*   Lipid Profile: Recent Labs    11/21/19 0555  CHOL 138  HDL 46  LDLCALC 73  TRIG 96  CHOLHDL 3.0   Thyroid Function Tests: No results for input(s): TSH, T4TOTAL, FREET4, T3FREE, THYROIDAB in the last 72 hours. Anemia Panel: No results for input(s): VITAMINB12, FOLATE, FERRITIN, TIBC, IRON, RETICCTPCT in the last 72 hours. Sepsis Labs: No results for input(s): PROCALCITON, LATICACIDVEN in the last 168 hours.  Recent Results (from the past 240 hour(s))  SARS CORONAVIRUS 2 (TAT 6-24 HRS) Nasopharyngeal Nasopharyngeal Swab     Status: None   Collection Time: 11/21/19  3:09 PM   Specimen: Nasopharyngeal Swab  Result Value Ref Range Status   SARS Coronavirus 2 NEGATIVE NEGATIVE Final    Comment: (NOTE) SARS-CoV-2 target nucleic acids are NOT DETECTED. The SARS-CoV-2 RNA is generally detectable in upper and lower respiratory specimens during the acute phase of infection. Negative results do not preclude SARS-CoV-2 infection, do not rule out co-infections with other pathogens, and should not be used as the sole basis  for treatment or other patient management decisions. Negative results must be combined with clinical observations, patient history, and epidemiological information. The expected result is Negative. Fact Sheet for Patients: SugarRoll.be Fact Sheet for Healthcare Providers: https://www.woods-mathews.com/ This test is not yet approved or cleared by the [place] FDA and  has been authorized for detection and/or diagnosis of SARS-CoV-2 by FDA under an Emergency Use Authorization (EUA). This EUA will remain  in effect (meaning this test can be used) for the duration of the COVID-19 declaration under Section 56 4(b)(1) of the Act, 21 U.S.C. section 360bbb-3(b)(1), unless the authorization is terminated or revoked sooner. Performed at [facility], [place] [address] [place], [place] [address]      RN Pressure Injury Documentation:     Estimated body mass index is 34.45 kg/m as calculated from  the following:   Height as of this encounter: 5\' 5"  (1.651 m).   Weight as of this encounter: 93.9 kg.  Malnutrition Type:      Malnutrition Characteristics:      Nutrition Interventions:    Radiology Studies: CT ABDOMEN PELVIS W CONTRAST  Result Date: 11/21/2019 CLINICAL DATA:  Abdominal pain, vomiting EXAM: CT ABDOMEN AND PELVIS WITH CONTRAST TECHNIQUE: Multidetector CT imaging of the abdomen and pelvis was performed using the standard protocol following bolus administration of intravenous contrast. CONTRAST:  124mL OMNIPAQUE IOHEXOL 300 MG/ML  SOLN COMPARISON:  07/13/2019 and previous FINDINGS: Lower chest: Linear scarring or subsegmental atelectasis in the lung bases left greater than right. No significant pleural or pericardial effusion. Hepatobiliary: No focal liver abnormality is seen. Status post cholecystectomy. No biliary dilatation. Pancreas: Unremarkable. No pancreatic ductal dilatation or surrounding inflammatory changes. Spleen:  Normal in size without focal abnormality. Adrenals/Urinary Tract: Right adrenal unremarkable. Bulbous left adrenal with punctate calcification, stable since 08/03/2014. Stomach/Bowel: Stomach and small bowel are nondistended. Normal appendix. Colon is nondilated, unremarkable. Vascular/Lymphatic: Minimal calcified aortic plaque. No aneurysm or stenosis. No abdominal or pelvic adenopathy. Reproductive: Status post hysterectomy. No adnexal masses. Other: No ascites. No free air. Musculoskeletal: Chronic atrophy in the inferior aspect of the left rectus musculature. Small umbilical hernia containing only mesenteric fat. Stable changes of instrumented fusion L4-S1. Mild narrowing of the L3-4 interspace. Bilateral hip DJD. No fracture or worrisome bone lesion. IMPRESSION: 1. No acute findings. 2. Stable postop and degenerative changes as above. Aortic Atherosclerosis (ICD10-I70.0). Electronically Signed   By: Lucrezia Europe M.D.   On: 11/21/2019 13:04   Scheduled Meds: . amitriptyline  10 mg Oral QHS  . enoxaparin (LOVENOX) injection  40 mg Subcutaneous Q24H  . escitalopram  10 mg Oral Daily  . insulin aspart  0-15 Units Subcutaneous Q4H  . insulin glargine  20 Units Subcutaneous QHS  . metoprolol succinate  50 mg Oral Daily  . pantoprazole  40 mg Oral BID  . simvastatin  20 mg Oral Daily  . sodium chloride flush  3 mL Intravenous Q12H  . sucralfate  1 g Oral TID WC & HS  . Vilazodone HCl  40 mg Oral Daily   Continuous Infusions: . sodium chloride 75 mL/hr at 11/22/19 1214    LOS: 0 days   Kerney Elbe, DO [facility] PAGER is on AMION  If 7PM-7AM, please contact night-coverage [email or web address]

## 2019-11-22 NOTE — Care Management Obs Status (Signed)
[place] NOTIFICATION   Patient Details  Name: Nassim Klepper MRN: [id number] Date of Birth: 21-Oct-1965   Medicare Observation Status Notification Given:  Yes    Lynnell Catalan, RN 11/22/2019, 3:48 PM

## 2019-11-23 DIAGNOSIS — I10 Essential (primary) hypertension: Secondary | ICD-10-CM | POA: Diagnosis present

## 2019-11-23 DIAGNOSIS — R112 Nausea with vomiting, unspecified: Secondary | ICD-10-CM | POA: Diagnosis present

## 2019-11-23 DIAGNOSIS — E785 Hyperlipidemia, unspecified: Secondary | ICD-10-CM | POA: Diagnosis present

## 2019-11-23 DIAGNOSIS — Z841 Family history of disorders of kidney and ureter: Secondary | ICD-10-CM | POA: Diagnosis not present

## 2019-11-23 DIAGNOSIS — F419 Anxiety disorder, unspecified: Secondary | ICD-10-CM | POA: Diagnosis present

## 2019-11-23 DIAGNOSIS — R1084 Generalized abdominal pain: Secondary | ICD-10-CM | POA: Diagnosis not present

## 2019-11-23 DIAGNOSIS — G8929 Other chronic pain: Secondary | ICD-10-CM | POA: Diagnosis present

## 2019-11-23 DIAGNOSIS — R1013 Epigastric pain: Secondary | ICD-10-CM | POA: Diagnosis present

## 2019-11-23 DIAGNOSIS — K5900 Constipation, unspecified: Secondary | ICD-10-CM

## 2019-11-23 DIAGNOSIS — Z20822 Contact with and (suspected) exposure to covid-19: Secondary | ICD-10-CM | POA: Diagnosis present

## 2019-11-23 DIAGNOSIS — Z981 Arthrodesis status: Secondary | ICD-10-CM | POA: Diagnosis not present

## 2019-11-23 DIAGNOSIS — Z8249 Family history of ischemic heart disease and other diseases of the circulatory system: Secondary | ICD-10-CM | POA: Diagnosis not present

## 2019-11-23 DIAGNOSIS — Z833 Family history of diabetes mellitus: Secondary | ICD-10-CM | POA: Diagnosis not present

## 2019-11-23 DIAGNOSIS — E669 Obesity, unspecified: Secondary | ICD-10-CM | POA: Diagnosis present

## 2019-11-23 DIAGNOSIS — R109 Unspecified abdominal pain: Secondary | ICD-10-CM | POA: Diagnosis present

## 2019-11-23 DIAGNOSIS — M549 Dorsalgia, unspecified: Secondary | ICD-10-CM | POA: Diagnosis present

## 2019-11-23 DIAGNOSIS — F329 Major depressive disorder, single episode, unspecified: Secondary | ICD-10-CM | POA: Diagnosis present

## 2019-11-23 DIAGNOSIS — K760 Fatty (change of) liver, not elsewhere classified: Secondary | ICD-10-CM | POA: Diagnosis present

## 2019-11-23 DIAGNOSIS — M1990 Unspecified osteoarthritis, unspecified site: Secondary | ICD-10-CM | POA: Diagnosis present

## 2019-11-23 DIAGNOSIS — E119 Type 2 diabetes mellitus without complications: Secondary | ICD-10-CM | POA: Diagnosis present

## 2019-11-23 DIAGNOSIS — K219 Gastro-esophageal reflux disease without esophagitis: Secondary | ICD-10-CM | POA: Diagnosis present

## 2019-11-23 DIAGNOSIS — A059 Bacterial foodborne intoxication, unspecified: Secondary | ICD-10-CM | POA: Diagnosis present

## 2019-11-23 DIAGNOSIS — Z6834 Body mass index (BMI) 34.0-34.9, adult: Secondary | ICD-10-CM | POA: Diagnosis not present

## 2019-11-23 DIAGNOSIS — Z794 Long term (current) use of insulin: Secondary | ICD-10-CM | POA: Diagnosis not present

## 2019-11-23 DIAGNOSIS — A084 Viral intestinal infection, unspecified: Secondary | ICD-10-CM | POA: Diagnosis present

## 2019-11-23 DIAGNOSIS — M797 Fibromyalgia: Secondary | ICD-10-CM | POA: Diagnosis present

## 2019-11-23 DIAGNOSIS — K861 Other chronic pancreatitis: Secondary | ICD-10-CM | POA: Diagnosis present

## 2019-11-23 LAB — CBC WITH DIFFERENTIAL/PLATELET
Abs Immature Granulocytes: 0.02 10*3/uL (ref 0.00–0.07)
Basophils Absolute: 0 10*3/uL (ref 0.0–0.1)
Basophils Relative: 0 %
Eosinophils Absolute: 0.1 10*3/uL (ref 0.0–0.5)
Eosinophils Relative: 1 %
HCT: 42.3 % (ref 36.0–46.0)
Hemoglobin: 13.9 g/dL (ref 12.0–15.0)
Immature Granulocytes: 0 %
Lymphocytes Relative: 39 %
Lymphs Abs: 4.2 10*3/uL — ABNORMAL HIGH (ref 0.7–4.0)
MCH: 27.9 pg (ref 26.0–34.0)
MCHC: 32.9 g/dL (ref 30.0–36.0)
MCV: 84.8 fL (ref 80.0–100.0)
Monocytes Absolute: 0.7 10*3/uL (ref 0.1–1.0)
Monocytes Relative: 6 %
Neutro Abs: 5.8 10*3/uL (ref 1.7–7.7)
Neutrophils Relative %: 54 %
Platelets: 318 10*3/uL (ref 150–400)
RBC: 4.99 MIL/uL (ref 3.87–5.11)
RDW: 13.8 % (ref 11.5–15.5)
WBC: 10.8 10*3/uL — ABNORMAL HIGH (ref 4.0–10.5)
nRBC: 0 % (ref 0.0–0.2)

## 2019-11-23 LAB — MAGNESIUM: Magnesium: 2.2 mg/dL (ref 1.7–2.4)

## 2019-11-23 LAB — GLUCOSE, CAPILLARY
Glucose-Capillary: 132 mg/dL — ABNORMAL HIGH (ref 70–99)
Glucose-Capillary: 172 mg/dL — ABNORMAL HIGH (ref 70–99)
Glucose-Capillary: 122 mg/dL — ABNORMAL HIGH (ref 70–99)
Glucose-Capillary: 169 mg/dL — ABNORMAL HIGH (ref 70–99)
Glucose-Capillary: 143 mg/dL — ABNORMAL HIGH (ref 70–99)
Glucose-Capillary: 127 mg/dL — ABNORMAL HIGH (ref 70–99)
Glucose-Capillary: 167 mg/dL — ABNORMAL HIGH (ref 70–99)

## 2019-11-23 LAB — COMPREHENSIVE METABOLIC PANEL
ALT: 21 U/L (ref 0–44)
AST: 17 U/L (ref 15–41)
Albumin: 3.9 g/dL (ref 3.5–5.0)
Alkaline Phosphatase: 86 U/L (ref 38–126)
Anion gap: 10 (ref 5–15)
BUN: 11 mg/dL (ref 6–20)
CO2: 25 mmol/L (ref 22–32)
Calcium: 8.5 mg/dL — ABNORMAL LOW (ref 8.9–10.3)
Chloride: 103 mmol/L (ref 98–111)
Creatinine, Ser: 0.65 mg/dL (ref 0.44–1.00)
GFR calc Af Amer: >60 mL/min (ref >60)
GFR calc non Af Amer: >60 mL/min (ref >60)
Glucose, Bld: 144 mg/dL — ABNORMAL HIGH (ref 70–99)
Potassium: 3.5 mmol/L (ref 3.5–5.1)
Sodium: 138 mmol/L (ref 135–145)
Total Bilirubin: 0.6 mg/dL (ref 0.3–1.2)
Total Protein: 6.8 g/dL (ref 6.5–8.1)

## 2019-11-23 LAB — HEMOGLOBIN A1C
Hgb A1c MFr Bld: 6.8 % — ABNORMAL HIGH (ref 4.8–5.6)
Mean Plasma Glucose: 148 mg/dL

## 2019-11-23 LAB — PHOSPHORUS: Phosphorus: 2.1 mg/dL — ABNORMAL LOW (ref 2.5–4.6)

## 2019-11-23 MED ORDER — K PHOS MONO-SOD PHOS DI & MONO 155-852-130 MG PO TABS
500.0000 mg | ORAL_TABLET | Freq: Two times a day (BID) | ORAL | Status: AC
  Administered 2019-11-23 (×2): 500 mg via ORAL
  Filled 2019-11-23 (×3): qty 2

## 2019-11-23 MED ORDER — POLYETHYLENE GLYCOL 3350 17 G PO PACK
17.0000 g | PACK | Freq: Two times a day (BID) | ORAL | Status: DC
  Administered 2019-11-23: 17 g via ORAL
  Filled 2019-11-23 (×2): qty 1

## 2019-11-23 MED ORDER — SENNOSIDES-DOCUSATE SODIUM 8.6-50 MG PO TABS
1.0000 | ORAL_TABLET | Freq: Two times a day (BID) | ORAL | Status: DC
  Administered 2019-11-23 – 2019-11-24 (×3): 1 via ORAL
  Filled 2019-11-23 (×3): qty 1

## 2019-11-23 MED ORDER — BISACODYL 10 MG RE SUPP: 10.0000 mg | Freq: Every day | RECTAL | Status: DC | PRN

## 2019-11-23 MED ORDER — POTASSIUM CHLORIDE CRYS ER 20 MEQ PO TBCR
40.0000 meq | EXTENDED_RELEASE_TABLET | Freq: Once | ORAL | Status: AC
  Administered 2019-11-23: 40 meq via ORAL
  Filled 2019-11-23: qty 2

## 2019-11-23 NOTE — Progress Notes (Signed)
PROGRESS NOTE    [name]  [id number] DOB: 04/10/1966 DOA: 11/21/2019 PCP: Nolene Ebbs, MD   Brief Narrative:  HPI per Dr. Truddie Hidden on 11/21/19 [name] is a 54 y.o. female with medical history significant of anxiety, depression, arthritis, fibromyalgia, GERD, T2DM/IDDM, HTN, HLD, fatty liver and history of pancreatitis who presents with abdominal pain with nausea vomiting.  Patient reports she was in usual state of health up until 2 days ago when she developed sudden onset nausea/vomiting.  She reports accompanying epigastric pain and left-sided abdominal pain.  She self reports this is similar to prior episodes of pancreatitis that she has dealt with.  She reports that she initially was vomiting once every hour but it has been spaced out to every couple of hours in the last 24 hours.  She she has trialed ibuprofen for pain control.  This has not been helpful.  She drove herself in today.  She has not kept down any food the past couple of days.  Patient self reports intermittent diagnoses of pancreatitis.  It does appear this has been noted through several ER notes.  However chart review reveals patient have gallbladder out in 2012 for polyps and biliary dyskinesia possibly contributing at that time.  She had gastric emptying study done in 2010 which was normal.  She does not normally report pain with meals.  She reports only using ibuprofen when the pain began and not prior to onset.  No diarrhea, no bloody stools.  She denies any abnormal meals, doubts food poisoning.  She has not had any fevers chills sweats.  She does smoke half pack per day, denies any alcohol use, does use marijuana every other day  **Interim History  Continue to have some abdominal pain but is not as nauseous and asking for clear liquid diet yesterday.  Diet was advanced to a soft diet today and she is tolerating this well however continues to have significant abdominal pain and states  that she has not had a bowel movement in a while.  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   -While possible her CT scan is unrevealing, self-reports she does not use alcohol, no history of hypertriglyceridemia, no history of gallstones and has had a cholecystectomy in 2012 and prior to that her lipases not convincing and consistent with pancreatitis.   -On imaging the pancreas itself looks unremarkable no calcifications to suggest multiple bouts of chronic pancreatitis leading to lower than expected levels.   -Doubtful of hereditary pancreatitis given lack of family history, divisum is a consideration.   -Retained stone also seems unlikely given the initial cholecystectomy was not done for gallstones.  LFTs also nonobstructive and bilirubin is not elevated -May consider MRCP -Besides pancreatitis consideration should be given to cannabis hyperemesis syndrome as the patient does utilizes every other day but she is complaining of pain now not nausea or vomiting -She has never been off of it for extended period time.   -She is  a diabetic but no history of gastroparesis and last gastric emptying study was done in 2010 which was normal.   -She is on multiple medications denies current opiate use but some of these may be contributing.   -She self-reports EGD which was normal.  -UA wnl, denies symptoms, AG wnl not in DKA -Continue IV fluids with  normal saline at 75 MLS per hour, pain control -Continue PPI reduce fluid rates, antiemetics; as needed Ativan if other agents fail -will obtain lipid panel to evaluate triglycerides; this was normal as triglyceride levels were 96 -Encouraged abstaining from cannabis -Initially was n.p.o. and advance diet as tolerated now on a soft diet -Treat with antiemetics and continue monitor response to intervention; continue with metoclopramide 10 mg p.o. every 6 hours as needed nausea or vomiting along with ondansetron 4 mg p.o./IV every 6 as needed for nausea -Continues to have some abdominal pain but CT of the abdomen pelvis showed "No acute findings. 2. Stable postop and degenerative changes as above." -Because of her continued abdominal pain we have consulted gastroenterology Dr. Benson Norway who will see the patient and make further recommendations  Leukocytosis -Mild, suspect stress demargination and dehydration; slightly worsened initially but is improved -Reduced IV fluid rate to 75 MLS per hour -BC went from 11.4 up to 13.4 now is 10.8 -Continue to monitor for signs or symptoms of infection -Repeat CBC in a.m.  IDDM/T2DM  -Continue dose reduced Lantus at 20 units -held oral agents -ISS and CBGs -Continue monitor CBGs carefully; CBGs ranging from 132-172  Hyperlipidemia -Continue Simvastatin 20 mg po Daily   Hypertension -Continue Metoprolol Succinate 50 mg p.o. daily  Anxiety and Depression -Continue Viibryd, escitalopram and amitriptyline -Tinea with lorazepam 0.5 mg IV every 4 as needed for refractory nausea and vomiting and continue with alprazolam 1 mg p.o. daily as needed anxiety  Hepatic Steatosis -Continue to  Encourage weight loss -May consider an MRCP and an abdominal ultrasound if she continues to have this diffuse abdominal pain -Gastroenterology is consulted for further evaluation  GERD -Continue PPI with pantoprazole 40 mg p.o. twice daily  Tobacco Use  Disorder -Smoking Cessastion Counseling given -Patient has not really thought to cut back  Obesity -Estimated body mass index is 34.45 kg/m as calculated from the following:   Height as of this encounter: 5\' 5"  (1.651 m).   Weight as of this encounter: 93.9 kg. -Weight Loss and Dietary Counseling given   Constipation -Start a bowel regimen with senna docusate 1 tab p.o. twice daily, MiraLAX 17 g p.o. twice daily, bisacodyl 10 mg suppository RC for moderate constipation -If not improving will try an enema  DVT prophylaxis: Enoxaparin 40 mg sq q24h Code Status: FULL CODE  Family Communication: Discussed with daughter at bedside Disposition Plan: Pending Further improvement in Nasuea and Vomiting and Tolerance of Diet; nausea vomiting is improved but continues have significant abdominal pain and had just been advanced to soft diet.  Gastroenterology will be coming by to see the patient given her continued significant abdominal pain.  Consultants:   Gastroenterology Dr. Benson Norway   Procedures: None   Antimicrobials:  Anti-infectives (From admission, onward)   None     Subjective: Seen and examined at bedside and states that she is no longer nauseous or vomiting and tolerating her clear liquid diet and wanting to advance to soft diet.  Still continues to complain of significant abdominal pain and hurt worse given she is set up.  She is extremity tender on palpation and  states that she has not had a bowel movement in a few days.  We will start her on a bowel regimen.  She denies any other complaints or concerns denies any chest pain, shortness of breath or lightheadedness.  Objective: Vitals:   11/22/19 0532 11/22/19 1415 11/23/19 0707 11/23/19 1340  BP: (!) 165/88 (!) 154/79 140/75 (!) 167/96  Pulse: 74 66 64 (!) 59  Resp: 16 18 16 18   Temp: 98.5 F (36.9 C) 98.5 F (36.9 C) 98.7 F (37.1 C) 98 F (36.7 C)  TempSrc: Oral Oral Oral Oral  SpO2: 94% 93% 94% 95%  Weight:       Height:       No intake or output data in the 24 hours ending 11/23/19 1629 Filed Weights   11/21/19 0550 11/21/19 1528  Weight: 90.7 kg 93.9 kg   Examination: Physical Exam:  Constitutional: WN/WD obese African-American female currently no acute distress appears calm but demonstrates complaint of significant abdominal pain when you palpate her abdomen Eyes: Lids and conjunctivae normal, sclerae anicteric  ENMT: External Ears, Nose appear normal. Grossly normal hearing.  Neck: Appears normal, supple, no cervical masses, normal ROM, no appreciable thyromegaly; no JVD Respiratory: Diminished to auscultation bilaterally, no wheezing, rales, rhonchi or crackles. Normal respiratory effort and patient is not tachypenic. No accessory muscle use.  Unlabored breathing Cardiovascular: RRR, no murmurs / rubs / gallops. S1 and S2 auscultated.  Some mild extremity edema.  Abdomen: Soft, very tender to palpate diffusely.,  Distended secondary body habitus. Bowel sounds positive.  GU: Deferred. Musculoskeletal: No clubbing / cyanosis of digits/nails. No joint deformity upper and lower extremities.  Skin: No rashes, lesions, ulcers on limited skin evaluation. No induration; Warm and dry.  Neurologic: CN 2-12 grossly intact with no focal deficits. Romberg sign and cerebellar reflexes not assessed.  Psychiatric: Normal judgment and insight. Alert and oriented x 3. Normal mood and appropriate affect.   Data Reviewed: I have personally reviewed following labs and imaging studies  CBC: Recent Labs  Lab 11/21/19 0555 11/22/19 0541 11/23/19 0556  WBC 11.4* 13.4* 10.8*  NEUTROABS  --   --  5.8  HGB 14.9 14.6 13.9  HCT 46.6* 44.8 42.3  MCV 85.8 84.8 84.8  PLT 392 353 0000000   Basic Metabolic Panel: Recent Labs  Lab 11/21/19 0555 11/22/19 0541 11/23/19 0556  NA 140 138 138  K 4.0 3.7 3.5  CL 104 101 103  CO2 26 25 25   GLUCOSE 189* 132* 144*  BUN 13 9 11   CREATININE 0.56 0.54 0.65  CALCIUM 9.1  8.9 8.5*  MG  --   --  2.2  PHOS  --   --  2.1*   GFR: Estimated Creatinine Clearance: 91.1 mL/min (by C-G formula based on SCr of 0.65 mg/dL). Liver Function Tests: Recent Labs  Lab 11/21/19 0555 11/23/19 0556  AST 19 17  ALT 19 21  ALKPHOS 101 86  BILITOT 0.9 0.6  PROT 8.2* 6.8  ALBUMIN 4.5 3.9   Recent Labs  Lab 11/21/19 0555  LIPASE 108*   No results for input(s): AMMONIA in the last 168 hours. Coagulation Profile: No results for input(s): INR, PROTIME in the last 168 hours. Cardiac Enzymes: No results for input(s): CKTOTAL, CKMB, CKMBINDEX, TROPONINI in the last 168 hours. BNP (last 3 results) No results for input(s): PROBNP in the last 8760 hours. HbA1C: Recent Labs    11/21/19 1652  HGBA1C 6.8*   CBG: Recent Labs  Lab 11/23/19 0119  11/23/19 0408 11/23/19 0803 11/23/19 1047 11/23/19 1204  GLUCAP 132* 172* 122* 169* 143*   Lipid Profile: Recent Labs    11/21/19 0555  CHOL 138  HDL 46  LDLCALC 73  TRIG 96  CHOLHDL 3.0   Thyroid Function Tests: No results for input(s): TSH, T4TOTAL, FREET4, T3FREE, THYROIDAB in the last 72 hours. Anemia Panel: No results for input(s): VITAMINB12, FOLATE, FERRITIN, TIBC, IRON, RETICCTPCT in the last 72 hours. Sepsis Labs: No results for input(s): PROCALCITON, LATICACIDVEN in the last 168 hours.  Recent Results (from the past 240 hour(s))  SARS CORONAVIRUS 2 (TAT 6-24 HRS) Nasopharyngeal Nasopharyngeal Swab     Status: None   Collection Time: 11/21/19  3:09 PM   Specimen: Nasopharyngeal Swab  Result Value Ref Range Status   SARS Coronavirus 2 NEGATIVE NEGATIVE Final    Comment: (NOTE) SARS-CoV-2 target nucleic acids are NOT DETECTED. The SARS-CoV-2 RNA is generally detectable in upper and lower respiratory specimens during the acute phase of infection. Negative results do not preclude SARS-CoV-2 infection, do not rule out co-infections with other pathogens, and should not be used as the sole basis for  treatment or other patient management decisions. Negative results must be combined with clinical observations, patient history, and epidemiological information. The expected result is Negative. Fact Sheet for Patients: SugarRoll[employer]be Fact Sheet for Healthcare Providers: https://www.woods-mathews.com/ This test is not yet approved or cleared by the [place] FDA and  has been authorized for detection and/or diagnosis of SARS-CoV-2 by FDA under an Emergency Use Authorization (EUA). This EUA will remain  in effect (meaning this test can be used) for the duration of the COVID-19 declaration under Section 56 4(b)(1) of the Act, 21 U.S.C. section 360bbb-3(b)(1), unless the authorization is terminated or revoked sooner. Performed at [facility], [place] [address], [place], [place] [address]      RN Pressure Injury Documentation:     Estimated body mass index is 34.45 kg/m as calculated from the following:   Height as of this encounter: 5\' 5"  (1.651 m).   Weight as of this encounter: 93.9 kg.  Malnutrition Type:      Malnutrition Characteristics:      Nutrition Interventions:    Radiology Studies: No results found. Scheduled Meds: . amitriptyline  10 mg Oral QHS  . enoxaparin (LOVENOX) injection  40 mg Subcutaneous Q24H  . escitalopram  10 mg Oral Daily  . insulin aspart  0-15 Units Subcutaneous Q4H  . insulin glargine  20 Units Subcutaneous QHS  . metoprolol succinate  50 mg Oral Daily  . pantoprazole  40 mg Oral BID  . phosphorus  500 mg Oral BID  . polyethylene glycol  17 g Oral BID  . senna-docusate  1 tablet Oral BID  . simvastatin  20 mg Oral Daily  . sodium chloride flush  3 mL Intravenous Q12H  . sucralfate  1 g Oral TID WC & HS  . Vilazodone HCl  40 mg Oral Daily   Continuous Infusions: . sodium chloride 75 mL/hr at 11/23/19 1547    LOS: 0 days   Kerney Elbe, DO [facility] PAGER is on  AMION  If 7PM-7AM, please contact night-coverage [email or web address]

## 2019-11-24 DIAGNOSIS — R1084 Generalized abdominal pain: Secondary | ICD-10-CM | POA: Diagnosis not present

## 2019-11-24 LAB — GLUCOSE, CAPILLARY
Glucose-Capillary: 120 mg/dL — ABNORMAL HIGH (ref 70–99)
Glucose-Capillary: 121 mg/dL — ABNORMAL HIGH (ref 70–99)
Glucose-Capillary: 147 mg/dL — ABNORMAL HIGH (ref 70–99)

## 2019-11-24 LAB — PHOSPHORUS: Phosphorus: 4 mg/dL (ref 2.5–4.6)

## 2019-11-24 LAB — COMPREHENSIVE METABOLIC PANEL
ALT: 23 U/L (ref 0–44)
AST: 19 U/L (ref 15–41)
Albumin: 3.9 g/dL (ref 3.5–5.0)
Alkaline Phosphatase: 79 U/L (ref 38–126)
Anion gap: 10 (ref 5–15)
BUN: 10 mg/dL (ref 6–20)
CO2: 26 mmol/L (ref 22–32)
Calcium: 8.6 mg/dL — ABNORMAL LOW (ref 8.9–10.3)
Chloride: 104 mmol/L (ref 98–111)
Creatinine, Ser: 0.6 mg/dL (ref 0.44–1.00)
GFR calc Af Amer: >60 mL/min (ref >60)
GFR calc non Af Amer: >60 mL/min (ref >60)
Glucose, Bld: 154 mg/dL — ABNORMAL HIGH (ref 70–99)
Potassium: 3.7 mmol/L (ref 3.5–5.1)
Sodium: 140 mmol/L (ref 135–145)
Total Bilirubin: 0.6 mg/dL (ref 0.3–1.2)
Total Protein: 6.8 g/dL (ref 6.5–8.1)

## 2019-11-24 LAB — CBC WITH DIFFERENTIAL/PLATELET
Abs Immature Granulocytes: 0.03 10*3/uL (ref 0.00–0.07)
Basophils Absolute: 0 10*3/uL (ref 0.0–0.1)
Basophils Relative: 0 %
Eosinophils Absolute: 0.1 10*3/uL (ref 0.0–0.5)
Eosinophils Relative: 1 %
HCT: 42.8 % (ref 36.0–46.0)
Hemoglobin: 13.7 g/dL (ref 12.0–15.0)
Immature Granulocytes: 0 %
Lymphocytes Relative: 37 %
Lymphs Abs: 3.4 10*3/uL (ref 0.7–4.0)
MCH: 27.3 pg (ref 26.0–34.0)
MCHC: 32 g/dL (ref 30.0–36.0)
MCV: 85.3 fL (ref 80.0–100.0)
Monocytes Absolute: 0.7 10*3/uL (ref 0.1–1.0)
Monocytes Relative: 8 %
Neutro Abs: 5 10*3/uL (ref 1.7–7.7)
Neutrophils Relative %: 54 %
Platelets: 310 10*3/uL (ref 150–400)
RBC: 5.02 MIL/uL (ref 3.87–5.11)
RDW: 13.7 % (ref 11.5–15.5)
WBC: 9.2 10*3/uL (ref 4.0–10.5)
nRBC: 0 % (ref 0.0–0.2)

## 2019-11-24 LAB — MAGNESIUM: Magnesium: 2 mg/dL (ref 1.7–2.4)

## 2019-11-24 MED ORDER — POLYETHYLENE GLYCOL 3350 17 G PO PACK: 17.0000 g | PACK | Freq: Every day | ORAL | 0 refills | Status: DC | PRN

## 2019-11-24 MED ORDER — HYDROCODONE-ACETAMINOPHEN 5-325 MG PO TABS: 1.0000 | ORAL_TABLET | Freq: Four times a day (QID) | ORAL | 0 refills | Status: DC | PRN

## 2019-12-02 ENCOUNTER — Emergency Department (HOSPITAL_COMMUNITY)
Admission: EM | Admit: 2019-12-02 | Discharge: 2019-12-02 | Disposition: A | Discharge status: Home / Self Care | Payer: Medicare Other | Attending: Emergency Medicine | Admitting: Emergency Medicine

## 2019-12-02 ENCOUNTER — Emergency Department (HOSPITAL_COMMUNITY): Payer: Medicare Other

## 2019-12-02 ENCOUNTER — Encounter (HOSPITAL_COMMUNITY): Payer: Self-pay | Admitting: Emergency Medicine

## 2019-12-02 DIAGNOSIS — Z794 Long term (current) use of insulin: Secondary | ICD-10-CM | POA: Diagnosis not present

## 2019-12-02 DIAGNOSIS — F17210 Nicotine dependence, cigarettes, uncomplicated: Secondary | ICD-10-CM | POA: Diagnosis not present

## 2019-12-02 DIAGNOSIS — R1110 Vomiting, unspecified: Secondary | ICD-10-CM | POA: Diagnosis not present

## 2019-12-02 DIAGNOSIS — I10 Essential (primary) hypertension: Secondary | ICD-10-CM | POA: Diagnosis not present

## 2019-12-02 DIAGNOSIS — K5900 Constipation, unspecified: Secondary | ICD-10-CM

## 2019-12-02 DIAGNOSIS — Z79899 Other long term (current) drug therapy: Secondary | ICD-10-CM | POA: Diagnosis not present

## 2019-12-02 DIAGNOSIS — E119 Type 2 diabetes mellitus without complications: Secondary | ICD-10-CM | POA: Insufficient documentation

## 2019-12-02 DIAGNOSIS — R1084 Generalized abdominal pain: Secondary | ICD-10-CM | POA: Diagnosis not present

## 2019-12-02 LAB — CBC
HCT: 47.7 % — ABNORMAL HIGH (ref 36.0–46.0)
Hemoglobin: 15.5 g/dL — ABNORMAL HIGH (ref 12.0–15.0)
MCH: 28 pg (ref 26.0–34.0)
MCHC: 32.5 g/dL (ref 30.0–36.0)
MCV: 86.3 fL (ref 80.0–100.0)
Platelets: 350 10*3/uL (ref 150–400)
RBC: 5.53 MIL/uL — ABNORMAL HIGH (ref 3.87–5.11)
RDW: 14.3 % (ref 11.5–15.5)
WBC: 14.4 10*3/uL — ABNORMAL HIGH (ref 4.0–10.5)
nRBC: 0 % (ref 0.0–0.2)

## 2019-12-02 LAB — COMPREHENSIVE METABOLIC PANEL
ALT: 33 U/L (ref 0–44)
AST: 23 U/L (ref 15–41)
Albumin: 4.8 g/dL (ref 3.5–5.0)
Alkaline Phosphatase: 114 U/L (ref 38–126)
Anion gap: 11 (ref 5–15)
BUN: 9 mg/dL (ref 6–20)
CO2: 28 mmol/L (ref 22–32)
Calcium: 9.3 mg/dL (ref 8.9–10.3)
Chloride: 97 mmol/L — ABNORMAL LOW (ref 98–111)
Creatinine, Ser: 0.57 mg/dL (ref 0.44–1.00)
GFR calc Af Amer: >60 mL/min (ref >60)
GFR calc non Af Amer: >60 mL/min (ref >60)
Glucose, Bld: 257 mg/dL — ABNORMAL HIGH (ref 70–99)
Potassium: 4.2 mmol/L (ref 3.5–5.1)
Sodium: 136 mmol/L (ref 135–145)
Total Bilirubin: 0.6 mg/dL (ref 0.3–1.2)
Total Protein: 8.7 g/dL — ABNORMAL HIGH (ref 6.5–8.1)

## 2019-12-02 LAB — URINALYSIS, ROUTINE W REFLEX MICROSCOPIC
Bilirubin Urine: NEGATIVE
Glucose, UA: 150 mg/dL — AB
Hgb urine dipstick: NEGATIVE
Ketones, ur: 5 mg/dL — AB
Leukocytes,Ua: NEGATIVE
Nitrite: NEGATIVE
Protein, ur: NEGATIVE mg/dL
Specific Gravity, Urine: 1.015 (ref 1.005–1.030)
pH: 8 (ref 5.0–8.0)

## 2019-12-02 LAB — I-STAT BETA HCG BLOOD, ED (MC, WL, AP ONLY): I-stat hCG, quantitative: <5 m[IU]/mL (ref <5)

## 2019-12-02 LAB — LIPASE, BLOOD: Lipase: 32 U/L (ref 11–51)

## 2019-12-02 MED ORDER — MORPHINE SULFATE (PF) 4 MG/ML IV SOLN
4.0000 mg | Freq: Once | INTRAVENOUS | Status: AC
  Administered 2019-12-02: 4 mg via INTRAVENOUS
  Filled 2019-12-02: qty 1

## 2019-12-02 MED ORDER — MAGNESIUM CITRATE PO SOLN: 296.0000 mL | Freq: Once | ORAL | 0 refills | Status: AC

## 2019-12-02 MED ORDER — PROMETHAZINE HCL 25 MG PO TABS: 25.0000 mg | ORAL_TABLET | Freq: Four times a day (QID) | ORAL | 0 refills | Status: DC | PRN

## 2019-12-02 MED ORDER — DICYCLOMINE HCL 20 MG PO TABS: 20.0000 mg | ORAL_TABLET | Freq: Two times a day (BID) | ORAL | 0 refills | Status: DC

## 2019-12-02 MED ORDER — SODIUM CHLORIDE 0.9% FLUSH
3.0000 mL | Freq: Once | INTRAVENOUS | Status: AC
  Administered 2019-12-02: 3 mL via INTRAVENOUS

## 2019-12-02 MED ORDER — SODIUM CHLORIDE 0.9 % IV BOLUS
1000.0000 mL | Freq: Once | INTRAVENOUS | Status: AC
  Administered 2019-12-02: 12:00:00 1000 mL via INTRAVENOUS

## 2019-12-02 MED ORDER — HYDROMORPHONE HCL 1 MG/ML IJ SOLN
0.5000 mg | Freq: Once | INTRAMUSCULAR | Status: AC
  Administered 2019-12-02: 0.5 mg via INTRAVENOUS
  Filled 2019-12-02: qty 1

## 2019-12-02 MED ORDER — ONDANSETRON HCL 4 MG/2ML IJ SOLN
4.0000 mg | Freq: Once | INTRAMUSCULAR | Status: AC
  Administered 2019-12-02: 4 mg via INTRAVENOUS
  Filled 2019-12-02: qty 2

## 2019-12-02 NOTE — ED Provider Notes (Signed)
[facility] DEPT Provider Note   CSN: [id number] Arrival date & time: 12/02/19  0732     History Chief Complaint  Patient presents with  . Abdominal Pain  . Emesis    [name] is a 54 y.o. female who presents emergency department with a chief complaint of abdominal pain.  Patient states "my pancreatitis is acting up again."  She states that she began having epigastric abdominal pain radiating across the upper quadrants of her abdomen and occasionally to her back last night.  She rates the pain as severe.  She has multiple episodes of nonbloody nonbilious vomitus.  Patient states that she began having pancreatitis after she had her gallbladder removed.  She does not drink alcohol.  go        Past Medical History:  Diagnosis Date  . Anxiety   . Arthritis    rheumatoid , osteoarthritis  . Bronchitis   . Chronic back pain   . Depression   . Diabetes mellitus   . Fatty liver   . Fibromyalgia   . GERD (gastroesophageal reflux disease)   . Headache   . Hypertension   . Pancreatitis   . Pneumonia   . Rotator cuff tear, right     Patient Active Problem List   Diagnosis Date Noted  . Abdominal pain 11/23/2019  . Acute pancreatitis 11/21/2019  . Complete tear of right rotator cuff 07/22/2017    Class: Chronic  . Retained orthopedic hardware 07/22/2017    Class: Chronic  . Cervical disc herniation 07/21/2017    Class: Acute  . Status post cervical spinal fusion 07/21/2017  . Chronic back pain     Past Surgical History:  Procedure Laterality Date  . ABDOMINAL HYSTERECTOMY    . ANTERIOR CERVICAL DECOMP/DISCECTOMY FUSION N/A 07/21/2017   Procedure: ANTERIOR CERVICAL DISCECTOMY AND FUSION C6-7, REMOVAL OF SCREW C6 PLATE, DEPUY ZERO-P IMPLANT, LOCAL BONE GRAFT, ALLOGRAFT BONE GRAFT, VIVIGEN;  Surgeon: Jessy Oto, MD;  Location: [facility];  Service: Orthopedics;  Laterality: N/A;  . BACK SURGERY     laminectomy  . BONE GRAFT HIP  ILIAC CREST    . BREAST BIOPSY    . CARPAL TUNNEL RELEASE    . CERVICAL DISCECTOMY  07/21/2017  . CESAREAN SECTION    . CHOLECYSTECTOMY    . COLONOSCOPY    . frontal fusion    . neck fusion     patient reported  . SHOULDER ARTHROSCOPY WITH SUBACROMIAL DECOMPRESSION, ROTATOR CUFF REPAIR AND BICEP TENDON REPAIR Right 10/31/2017   Procedure: RIGHT SHOULDER ARTHROSCOPY, MANIPULATION UNDER ANESTHESIA, BICEPS TENODESIS, AND MINI OPEN ROTATOR CUFF TEAR REPAIR;  Surgeon: Meredith Pel, MD;  Location: [place];  Service: Orthopedics;  Laterality: Right;     OB History   No obstetric history on file.     Family History  Problem Relation Age of Onset  . Hypertension Father   . Renal Disease Father   . Diabetes Mother   . Hypertension Mother   . Edema Mother   . Diabetes Sister   . Diabetes Brother     Social History   Tobacco Use  . Smoking status: Current Every Day Smoker    Packs/day: 0.50    Types: Cigarettes  . Smokeless tobacco: Never Used  Substance Use Topics  . Alcohol use: No  . Drug use: Yes    Types: Marijuana    Home Medications Prior to Admission medications   Medication Sig Start Date End Date  Taking? Authorizing Provider  albuterol (PROVENTIL HFA;VENTOLIN HFA) 108 (90 BASE) MCG/ACT inhaler Inhale 2 puffs into the lungs every 4 (four) hours as needed for wheezing.    [provider]  albuterol (PROVENTIL) (2.5 MG/3ML) 0.083% nebulizer solution Take 2.5 mg by nebulization every 6 (six) hours as needed for wheezing or shortness of breath.    [provider]  ALPRAZolam Duanne Moron) 1 MG tablet Take 1 mg by mouth daily as needed for anxiety. 10/23/19   [provider]  amitriptyline (ELAVIL) 10 MG tablet TAKE 1 TABLET(10 MG) BY MOUTH AT BEDTIME Patient taking differently: Take 10 mg by mouth at bedtime.  08/07/18   Aundra Dubin, PA-C  AZOR 5-40 MG per tablet Take 1 tablet by mouth daily.  02/06/14   [provider]  cetirizine  (ZYRTEC) 10 MG tablet Take 10 mg by mouth daily as needed for allergies. 11/17/19   [provider]  cyclobenzaprine (FLEXERIL) 10 MG tablet Take 1 tablet (10 mg total) by mouth 2 (two) times daily as needed for muscle spasms. 07/13/19   Montine Circle, PA-C  DEXILANT 60 MG capsule Take 60 mg by mouth daily. 11/09/19   [provider]  diclofenac sodium (VOLTAREN) 1 % GEL Apply 2 g topically 4 (four) times daily. Patient not taking: Reported on 12/15/2018 04/22/18   Jessy Oto, MD  dicyclomine (BENTYL) 20 MG tablet Take 1 tablet (20 mg total) by mouth 2 (two) times daily. 12/02/19   Nadeem Romanoski, Vernie Shanks, PA-C  escitalopram (LEXAPRO) 10 MG tablet Take 10 mg by mouth daily. 11/11/19   [provider]  HYDROcodone-acetaminophen (NORCO/VICODIN) 5-325 MG tablet Take 1 tablet by mouth every 6 (six) hours as needed for moderate pain or severe pain. 11/24/19   Domenic Polite, MD  insulin glargine (LANTUS) 100 unit/mL SOPN Inject 40 Units into the skin at bedtime.     [provider]  JANUVIA 100 MG tablet Take 100 mg by mouth daily. 07/22/19   [provider]  metFORMIN (GLUCOPHAGE) 1000 MG tablet Take 1,000 mg by mouth 2 (two) times daily. 07/29/17   [provider]  metoprolol succinate (TOPROL-XL) 50 MG 24 hr tablet Take 50 mg by mouth daily. 07/21/14   [provider]  ondansetron (ZOFRAN ODT) 4 MG disintegrating tablet Take 1 tablet (4 mg total) by mouth every 8 (eight) hours as needed. Patient not taking: Reported on 11/21/2019 07/13/19   Montine Circle, PA-C  polyethylene glycol (MIRALAX / GLYCOLAX) 17 g packet Take 17 g by mouth daily as needed for mild constipation. 11/24/19   Domenic Polite, MD  promethazine (PHENERGAN) 25 MG tablet Take 1 tablet (25 mg total) by mouth every 6 (six) hours as needed for nausea or vomiting. 12/02/19   [name], PA-C  simvastatin (ZOCOR) 20 MG tablet Take 20 mg by mouth daily.     [provider]    TRULICITY [id number] SOPN Inject 0.75 mg into the skin once a week. 11/06/19   [provider]  VIIBRYD 40 MG TABS Take 40 mg by mouth daily. 06/03/19   [provider]  Vitamin D, Ergocalciferol, (DRISDOL) 1.25 MG (50000 UNIT) CAPS capsule Take 50,000 Units by mouth once a week. 10/23/19   [provider]    Allergies    Patient has no known allergies.  Review of Systems   Review of Systems Ten systems reviewed and are negative for acute change, except as noted in the HPI.   Physical Exam Updated  Vital Signs BP (!) 159/94   Pulse 88   Temp 98 F (36.7 C) (Oral)   Resp 18   SpO2 98%   Physical Exam Vitals and nursing note reviewed.  Constitutional:      General: She is not in acute distress.    Appearance: She is well-developed. She is not diaphoretic.  HENT:     Head: Normocephalic and atraumatic.  Eyes:     General: No scleral icterus.    Conjunctiva/sclera: Conjunctivae normal.  Cardiovascular:     Rate and Rhythm: Normal rate and regular rhythm.     Heart sounds: Normal heart sounds. No murmur. No friction rub. No gallop.   Pulmonary:     Effort: Pulmonary effort is normal. No respiratory distress.     Breath sounds: Normal breath sounds.  Abdominal:     General: Bowel sounds are normal. There is no distension.     Palpations: Abdomen is soft. There is no mass.     Tenderness: There is abdominal tenderness in the right upper quadrant, epigastric area and left upper quadrant. There is guarding. There is no right CVA tenderness or left CVA tenderness.  Musculoskeletal:     Cervical back: Normal range of motion.  Skin:    General: Skin is warm and dry.  Neurological:     Mental Status: She is alert and oriented to person, place, and time.  Psychiatric:        Behavior: Behavior normal.     ED Results / Procedures / Treatments   Labs (all labs ordered are listed, but only abnormal results are displayed) Labs Reviewed  COMPREHENSIVE  METABOLIC PANEL - Abnormal; Notable for the following components:      Result Value   Chloride 97 (*)    Glucose, Bld 257 (*)    Total Protein 8.7 (*)    All other components within normal limits  CBC - Abnormal; Notable for the following components:   WBC 14.4 (*)    RBC 5.53 (*)    Hemoglobin 15.5 (*)    HCT 47.7 (*)    All other components within normal limits  URINALYSIS, ROUTINE W REFLEX MICROSCOPIC - Abnormal; Notable for the following components:   Color, Urine STRAW (*)    Glucose, UA 150 (*)    Ketones, ur 5 (*)    All other components within normal limits  LIPASE, BLOOD  I-STAT BETA HCG BLOOD, ED ([facility], [facility], [facility] ONLY)    EKG None  Radiology DG ABD ACUTE 2+V W 1V CHEST  Result Date: 12/02/2019 CLINICAL DATA:  Left-sided abdominal pain for 2 days, pancreatitis EXAM: DG ABDOMEN ACUTE W/ 1V CHEST COMPARISON:  11/21/2019 FINDINGS: Supine and upright frontal views of the abdomen as well as an upright frontal view of the chest are obtained. The cardiac silhouette is unremarkable. No airspace disease, effusion, or pneumothorax. Bowel gas pattern is unremarkable. No obstruction or ileus. There are no masses or abnormal calcifications. No free gas in the greater peritoneal sac. Postsurgical changes are seen within the cervical and lumbar spine. Cholecystectomy clips right upper quadrant. IMPRESSION: 1. Unremarkable abdominal series. Electronically Signed   By: Randa Ngo M.D.   On: 12/02/2019 15:46    Procedures Procedures (including critical care time)  Medications Ordered in ED Medications  sodium chloride flush (NS) 0.9 % injection 3 mL (3 mLs Intravenous Given 12/02/19 1217)  sodium chloride 0.9 % bolus 1,000 mL (0 mLs Intravenous Stopped 12/02/19 1426)  morphine 4 MG/ML injection 4  mg (4 mg Intravenous Given 12/02/19 1216)  ondansetron (ZOFRAN) injection 4 mg (4 mg Intravenous Given 12/02/19 1216)  HYDROmorphone (DILAUDID) injection 0.5 mg (0.5 mg Intravenous Given 12/02/19  1459)    ED Course  I have reviewed the triage vital signs and the nursing notes.  Pertinent labs & imaging results that were available during my care of the patient were reviewed by me and considered in my medical decision making (see chart for details).  Clinical Course as of Dec 02 836  Thu Dec 02, 2019  1305 Glucose(!): 257 [AH]    Clinical Course User Index [AH] [name], PA-C   MDM Rules/Calculators/A&P                      [id number] pain  VS: BP (!) 159/94   Pulse 88   Temp 98 F (36.7 C) (Oral)   Resp 18   SpO2 98%   [id number] is gathered by patient and emr. Previous records obtained and reviewed. DDX:The patient's complaint of Epigastric Abdominal pain involves an extensive number of diagnostic and treatment options, and is a complaint that carries with it a high risk of complications, morbidity, and potential mortality. Given the large differential diagnosis, medical decision making is of high complexity. Differential diagnosis of epigastric pain includes: Functional or nonulcer dyspepsia, PUD, GERD, Gastritis, (NSAIDs, alcohol, stress, H. pylori, pernicious anemia), pancreatitis or pancreatic cancer, overeating indigestion (high-fat foods, coffee), drugs (aspirin, antibiotics (eg, macrolides, metronidazole), corticosteroids, digoxin, narcotics, theophylline), gastroparesis, lactose intolerance, malabsorption gastric cancer, parasitic infection, (Giardia, Strongyloides, Ascaris) cholelithiasis, choledocholithiasis, or cholangitis, ACS, pericarditis, pneumonia, abdominal hernia, pregnancy, intestinal ischemia, esophageal rupture, gastric volvulus, hepatitis. Labs: I ordered reviewed and interpreted labs which include urinalysis which shows no evidence of infection.  hCG which is negative.  Lipase which is within normal limits.  CMP with elevated blood glucose.  No other significant abnormalities.  CBC does show elevated white blood cell count and elevated hemoglobin  this is likely contraction and acute phase reaction. Imaging: I ordered and reviewed images which included acute abdominal film with chest. I independently visualized and interpreted all imaging. Significant findings include large stool burden seen on plain film.  Consults: MDM: This is a 54 year old female with a past medical history of recurrent abdominal pain.  Her pain today is the same as it is at previous visits.  Her review of EMR shows that hospitalist state that she has not had any bouts of pancreatitis however review of the imaging shows that in 2016 she did have extensive peripancreatic fat stranding suggestive of a diagnosis of pancreatitis at that time.  Patient has had extensive work-up by both her PCP and by Dr. Collene Mares and saw her earlier this year.  Today she does not appear to have evidence of pancreatitis.  I did review the chart and it looks like she has had scan just 11 days ago and I do not think that it is worth repeating a CT scan at this time although the patient does have an increase in her white blood cell count from previous.  Reexamined the patient after pain medications and her abdominal exam has improved significantly.  The patient will be discharged home on antiemetics and given Bentyl.  I have also ordered mag citrate.  I have discussed return precautions with the patient to include any worsening or severe pain, intractable vomiting, fever or confusion.  Patient appears otherwise appropriate for discharge at this time and should follow closely with her PCP  or gastroenterologist. Patient disposition: The patient appears reasonably screened and/or stabilized for discharge and I doubt any other medical condition or other [facility] requiring further screening, evaluation, or treatment in the ED at this time prior to discharge. I have discussed lab and/or imaging findings with the patient and answered all questions/concerns to the best of my ability.I have discussed return precautions and OP  follow up.    Final Clinical Impression(s) / ED Diagnoses Final diagnoses:  Generalized abdominal pain  Constipation, unspecified constipation type    Rx / DC Orders ED Discharge Orders         Ordered    dicyclomine (BENTYL) 20 MG tablet  2 times daily     12/02/19 1542    magnesium citrate solution   Once     12/02/19 1542    promethazine (PHENERGAN) 25 MG tablet  Every 6 hours PRN     12/02/19 1542           [name], PA-C 12/03/19 [id number]    Sherwood Gambler, MD 12/03/19 1021

## 2019-12-02 NOTE — Discharge Instructions (Addendum)

## 2019-12-02 NOTE — ED Triage Notes (Signed)
Per [facility] pt from home for abd pains with n/v. Hx pancreatitis.

## 2019-12-02 NOTE — ED Notes (Signed)
An After Visit Summary was printed and given to the patient. Discharge instructions given and no further questions at this time.  

## 2019-12-12 ENCOUNTER — Ambulatory Visit (INDEPENDENT_AMBULATORY_CARE_PROVIDER_SITE_OTHER): Payer: Medicare Other

## 2019-12-12 ENCOUNTER — Other Ambulatory Visit: Payer: Self-pay

## 2019-12-12 ENCOUNTER — Ambulatory Visit
Admission: EM | Admit: 2019-12-12 | Discharge: 2019-12-12 | Disposition: A | Discharge status: Home / Self Care | Payer: Medicare Other | Attending: Physician Assistant | Admitting: Physician Assistant

## 2019-12-12 DIAGNOSIS — R0602 Shortness of breath: Secondary | ICD-10-CM

## 2019-12-12 DIAGNOSIS — E119 Type 2 diabetes mellitus without complications: Secondary | ICD-10-CM

## 2019-12-12 DIAGNOSIS — R05 Cough: Secondary | ICD-10-CM

## 2019-12-12 DIAGNOSIS — R6883 Chills (without fever): Secondary | ICD-10-CM

## 2019-12-12 DIAGNOSIS — R059 Cough, unspecified: Secondary | ICD-10-CM

## 2019-12-12 DIAGNOSIS — J45909 Unspecified asthma, uncomplicated: Secondary | ICD-10-CM

## 2019-12-12 DIAGNOSIS — R5383 Other fatigue: Secondary | ICD-10-CM | POA: Diagnosis not present

## 2019-12-12 DIAGNOSIS — R0781 Pleurodynia: Secondary | ICD-10-CM

## 2019-12-12 DIAGNOSIS — I10 Essential (primary) hypertension: Secondary | ICD-10-CM

## 2019-12-12 DIAGNOSIS — F17200 Nicotine dependence, unspecified, uncomplicated: Secondary | ICD-10-CM

## 2019-12-12 DIAGNOSIS — R630 Anorexia: Secondary | ICD-10-CM

## 2019-12-12 DIAGNOSIS — R0981 Nasal congestion: Secondary | ICD-10-CM

## 2019-12-12 LAB — POC SARS CORONAVIRUS 2 AG -  ED: SARS Coronavirus 2 Ag: NEGATIVE

## 2019-12-12 MED ORDER — BENZONATATE 200 MG PO CAPS: 200.0000 mg | ORAL_CAPSULE | Freq: Three times a day (TID) | ORAL | 0 refills | Status: DC

## 2019-12-12 MED ORDER — ALBUTEROL SULFATE (2.5 MG/3ML) 0.083% IN NEBU: 2.5000 mg | INHALATION_SOLUTION | Freq: Four times a day (QID) | RESPIRATORY_TRACT | 0 refills | Status: AC | PRN

## 2019-12-12 MED ORDER — DOXYCYCLINE HYCLATE 100 MG PO CAPS: 100.0000 mg | ORAL_CAPSULE | Freq: Two times a day (BID) | ORAL | 0 refills | Status: DC

## 2019-12-12 MED ORDER — DEXAMETHASONE SODIUM PHOSPHATE 10 MG/ML IJ SOLN
10.0000 mg | Freq: Once | INTRAMUSCULAR | Status: AC
  Administered 2019-12-12: 10:00:00 10 mg via INTRAMUSCULAR

## 2019-12-12 NOTE — ED Triage Notes (Addendum)
Pt presents with a productive cough (green) accompanied by chills, fatigue and poor appetite.  She does not have a thermometer at home. She reports having rib pain from persistent cough.  Symptoms started x 3 days ago. Home interventions with minimal relief.

## 2019-12-12 NOTE — ED Provider Notes (Signed)
[facility]    CSN: [id number] Arrival date & time: 12/12/19  [id number]      History   Chief Complaint Chief Complaint  Patient presents with  . Cough    rib pain from couging  . Chills  . Fatigue    HPI [name] is a 54 y.o. female.   54 year old female with history of insulin dependant DM, bronchitis, HTN comes in  for 3 day of URI symptoms. Chills, productive cough, subjective fever, decreased appetite, rhinorrhea, nasal congestion, body aches. Pain to abdomen with cough. Nausea at times without vomiting.  Denies diarrhea. Denies loss of taste/smell. Short of breath where she cannot take deep breaths and therefore taking shallow breaths. Albuterol with some relief. Current every day smoker. Finished pfizer 11/25/2019.     Past Medical History:  Diagnosis Date  . Anxiety   . Arthritis    rheumatoid , osteoarthritis  . Bronchitis   . Chronic back pain   . Depression   . Diabetes mellitus   . Fatty liver   . Fibromyalgia   . GERD (gastroesophageal reflux disease)   . Headache   . Hypertension   . Pancreatitis   . Pneumonia   . Rotator cuff tear, right     Patient Active Problem List   Diagnosis Date Noted  . Abdominal pain 11/23/2019  . Acute pancreatitis 11/21/2019  . Complete tear of right rotator cuff 07/22/2017    Class: Chronic  . Retained orthopedic hardware 07/22/2017    Class: Chronic  . Cervical disc herniation 07/21/2017    Class: Acute  . Status post cervical spinal fusion 07/21/2017  . Chronic back pain     Past Surgical History:  Procedure Laterality Date  . ABDOMINAL HYSTERECTOMY    . ANTERIOR CERVICAL DECOMP/DISCECTOMY FUSION N/A 07/21/2017   Procedure: ANTERIOR CERVICAL DISCECTOMY AND FUSION C6-7, REMOVAL OF SCREW C6 PLATE, DEPUY ZERO-P IMPLANT, LOCAL BONE GRAFT, ALLOGRAFT BONE GRAFT, VIVIGEN;  Surgeon: Jessy Oto, MD;  Location: [place];  Service: Orthopedics;  Laterality: N/A;  . BACK SURGERY     laminectomy  .  BONE GRAFT HIP ILIAC CREST    . BREAST BIOPSY    . CARPAL TUNNEL RELEASE    . CERVICAL DISCECTOMY  07/21/2017  . CESAREAN SECTION    . CHOLECYSTECTOMY    . COLONOSCOPY    . frontal fusion    . neck fusion     patient reported  . SHOULDER ARTHROSCOPY WITH SUBACROMIAL DECOMPRESSION, ROTATOR CUFF REPAIR AND BICEP TENDON REPAIR Right 10/31/2017   Procedure: RIGHT SHOULDER ARTHROSCOPY, MANIPULATION UNDER ANESTHESIA, BICEPS TENODESIS, AND MINI OPEN ROTATOR CUFF TEAR REPAIR;  Surgeon: Meredith Pel, MD;  Location: [place];  Service: Orthopedics;  Laterality: Right;    OB History   No obstetric history on file.      Home Medications    Prior to Admission medications   Medication Sig Start Date End Date Taking? Authorizing Provider  albuterol (PROVENTIL HFA;VENTOLIN HFA) 108 (90 BASE) MCG/ACT inhaler Inhale 2 puffs into the lungs every 4 (four) hours as needed for wheezing.    [provider]  albuterol (PROVENTIL) (2.5 MG/3ML) 0.083% nebulizer solution Take 3 mLs (2.5 mg total) by nebulization every 6 (six) hours as needed for wheezing or shortness of breath. 12/12/19   Tasia Catchings, Jaevon Paras V, PA-C  ALPRAZolam Duanne Moron) 1 MG tablet Take 1 mg by mouth daily as needed for anxiety. 10/23/19   [provider]  amitriptyline Madelin Headings)  10 MG tablet TAKE 1 TABLET(10 MG) BY MOUTH AT BEDTIME Patient taking differently: Take 10 mg by mouth at bedtime.  08/07/18   Aundra Dubin, PA-C  AZOR 5-40 MG per tablet Take 1 tablet by mouth daily.  02/06/14   [provider]  benzonatate (TESSALON) 200 MG capsule Take 1 capsule (200 mg total) by mouth every 8 (eight) hours. 12/12/19   Tasia Catchings, Tae Robak V, PA-C  cetirizine (ZYRTEC) 10 MG tablet Take 10 mg by mouth daily as needed for allergies. 11/17/19   [provider]  cyclobenzaprine (FLEXERIL) 10 MG tablet Take 1 tablet (10 mg total) by mouth 2 (two) times daily as needed for muscle spasms. 07/13/19   Montine Circle, PA-C  DEXILANT 60 MG capsule  Take 60 mg by mouth daily. 11/09/19   [provider]  diclofenac sodium (VOLTAREN) 1 % GEL Apply 2 g topically 4 (four) times daily. Patient not taking: Reported on 12/15/2018 04/22/18   Jessy Oto, MD  dicyclomine (BENTYL) 20 MG tablet Take 1 tablet (20 mg total) by mouth 2 (two) times daily. 12/02/19   [name], PA-C  doxycycline (VIBRAMYCIN) 100 MG capsule Take 1 capsule (100 mg total) by mouth 2 (two) times daily. 12/12/19   Tasia Catchings, Ryann Pauli V, PA-C  escitalopram (LEXAPRO) 10 MG tablet Take 10 mg by mouth daily. 11/11/19   [provider]  HYDROcodone-acetaminophen (NORCO/VICODIN) 5-325 MG tablet Take 1 tablet by mouth every 6 (six) hours as needed for moderate pain or severe pain. 11/24/19   Domenic Polite, MD  insulin glargine (LANTUS) 100 unit/mL SOPN Inject 40 Units into the skin at bedtime.     [provider]  JANUVIA 100 MG tablet Take 100 mg by mouth daily. 07/22/19   [provider]  metFORMIN (GLUCOPHAGE) 1000 MG tablet Take 1,000 mg by mouth 2 (two) times daily. 07/29/17   [provider]  metoprolol succinate (TOPROL-XL) 50 MG 24 hr tablet Take 50 mg by mouth daily. 07/21/14   [provider]  ondansetron (ZOFRAN ODT) 4 MG disintegrating tablet Take 1 tablet (4 mg total) by mouth every 8 (eight) hours as needed. Patient not taking: Reported on 11/21/2019 07/13/19   Montine Circle, PA-C  polyethylene glycol (MIRALAX / GLYCOLAX) 17 g packet Take 17 g by mouth daily as needed for mild constipation. 11/24/19   Domenic Polite, MD  promethazine (PHENERGAN) 25 MG tablet Take 1 tablet (25 mg total) by mouth every 6 (six) hours as needed for nausea or vomiting. 12/02/19   [name], PA-C  simvastatin (ZOCOR) 20 MG tablet Take 20 mg by mouth daily.     [provider]  TRULICITY [id number] [id number] SOPN Inject 0.75 mg into the skin once a week. 11/06/19   [provider]  VIIBRYD 40 MG TABS Take 40 mg by mouth daily. 06/03/19    [provider]  Vitamin D, Ergocalciferol, (DRISDOL) 1.25 MG (50000 UNIT) CAPS capsule Take 50,000 Units by mouth once a week. 10/23/19   [provider]    Family History Family History  Problem Relation Age of Onset  . Hypertension Father   . Renal Disease Father   . Diabetes Mother   . Hypertension Mother   . Edema Mother   . Diabetes Sister   . Diabetes Brother     Social History Social History   Tobacco Use  . Smoking status: Current Every Day Smoker    Packs/day: 0.50    Types: Cigarettes  . Smokeless  tobacco: Never Used  Substance Use Topics  . Alcohol use: No  . Drug use: Yes    Types: Marijuana     Allergies   Patient has no known allergies.   Review of Systems Review of Systems  Reason unable to perform ROS: See HPI as above.     Physical Exam Triage Vital Signs ED Triage Vitals  Enc Vitals Group     BP 12/12/19 0835 (!) 153/91     Pulse Rate 12/12/19 0835 (!) 117     Resp 12/12/19 0835 (!) 22     Temp 12/12/19 0835 99.1 F (37.3 C)     Temp Source 12/12/19 0835 Oral     SpO2 12/12/19 0835 92 %     Weight --      Height --      Head Circumference --      Peak Flow --      Pain Score 12/12/19 0858 10     Pain Loc --      Pain Edu? --      Excl. in [place]? --    No data found.  Updated Vital Signs BP (!) 153/91 (BP Location: Left Arm)   Pulse (!) 117   Temp 99.1 F (37.3 C) (Oral)   Resp (!) 22   SpO2 92%   Physical Exam Constitutional:      General: She is not in acute distress.    Appearance: Normal appearance. She is not ill-appearing, toxic-appearing or diaphoretic.  HENT:     Head: Normocephalic and atraumatic.     Mouth/Throat:     Mouth: Mucous membranes are moist.     Pharynx: Oropharynx is clear. Uvula midline.  Cardiovascular:     Rate and Rhythm: Regular rhythm. Tachycardia present.     Heart sounds: Normal heart sounds. No murmur. No friction rub. No gallop.      Comments: Recheck O2 96% Pulmonary:      Effort: Pulmonary effort is normal. No accessory muscle usage, prolonged expiration, respiratory distress or retractions.     Comments: Exam limited as patient coughing throughout. No obvious adventitious lung sounds Musculoskeletal:     Cervical back: Normal range of motion and neck supple.  Neurological:     General: No focal deficit present.     Mental Status: She is alert and oriented to person, place, and time.      UC Treatments / Results  Labs (all labs ordered are listed, but only abnormal results are displayed) Labs Reviewed  NOVEL CORONAVIRUS, NAA  POC SARS CORONAVIRUS 2 AG -  ED    EKG   Radiology DG Chest 2 View  Result Date: 12/12/2019 CLINICAL DATA:  Productive cough, shortness of breath, chills, fatigue, anorexia and rib pain for 3 days; history pneumonia, hypertension, asthma, bronchitis, diabetes mellitus, smoker EXAM: CHEST - 2 VIEW COMPARISON:  06/22/2015 FINDINGS: Upper-normal size of cardiac silhouette. Mediastinal contours and pulmonary vascularity normal. Minimal linear atelectasis versus scarring at lingula. Lungs otherwise clear. No acute infiltrate, pleural effusion or pneumothorax. Prior cervical spine fusion. IMPRESSION: Minimal atelectasis or scarring at lingula. Electronically Signed   By: Lavonia Dana M.D.   On: 12/12/2019 09:53    Procedures Procedures (including critical care time)  Medications Ordered in UC Medications  dexamethasone (DECADRON) injection 10 mg (10 mg Intramuscular Given 12/12/19 1008)    Initial Impression / Assessment and Plan / UC Course  I have reviewed the triage vital signs and the nursing notes.  Pertinent labs &  imaging results that were available during my care of the patient were reviewed by me and considered in my medical decision making (see chart for details).    Rapid Covid positive Covid PCR testing ordered, patient to quarantine until testing results return.  Chest x-ray negative for pneumonia, does show  minimal atelectasis or scarring at the lingula.  Given increased production with cough, history of smoking, will cover for COPD exacerbation with doxycycline.  Given history of diabetes, patient stating CBG has been higher since symptom onset, will provide Decadron injection in office today.  Albuterol inhaler/nebulizer as needed.  Return precautions given.  Patient expresses understanding and agrees to plan.  Final Clinical Impressions(s) / UC Diagnoses   Final diagnoses:  Cough  Nasal congestion  Chills   ED Prescriptions    Medication Sig Dispense Auth. Provider   benzonatate (TESSALON) 200 MG capsule Take 1 capsule (200 mg total) by mouth every 8 (eight) hours. 21 capsule Jaelin Fackler V, PA-C   albuterol (PROVENTIL) (2.5 MG/3ML) 0.083% nebulizer solution Take 3 mLs (2.5 mg total) by nebulization every 6 (six) hours as needed for wheezing or shortness of breath. 75 mL Tamra Koos V, PA-C   doxycycline (VIBRAMYCIN) 100 MG capsule Take 1 capsule (100 mg total) by mouth 2 (two) times daily. 14 capsule Tasia Catchings, Ericia Moxley V, PA-C     I have reviewed the PDMP during this encounter.   Ok Edwards, PA-C 12/12/19 1024

## 2019-12-12 NOTE — Discharge Instructions (Addendum)
Rapid COVID negative. COVID PCR testing ordered. I would like you to quarantine until testing results. Decadron injection in office today. Tessalon for cough. Doxycycline to cover for bronchitis. Albuterol neb as needed. Tylenol/motrin for pain and fever. Keep hydrated, urine should be clear to pale yellow in color. If experiencing shortness of breath, trouble breathing, go to the emergency department for further evaluation needed.

## 2019-12-13 LAB — SARS-COV-2, NAA 2 DAY TAT

## 2019-12-13 LAB — NOVEL CORONAVIRUS, NAA: SARS-CoV-2, NAA: NOT DETECTED

## 2019-12-14 NOTE — Discharge Summary (Signed)
Physician Discharge Summary  [name] [id number] DOB: July 15, 1966 DOA: 11/21/2019  PCP: Nolene Ebbs, MD  Admit date: 11/21/2019 Discharge date: 11/24/2019  Time spent: 35 minutes  Recommendations for Outpatient Follow-up:  Gastroenterology Dr. Collene Mares in 1 month  PCP Dr.Avbuere in 1 week  Discharge Diagnoses:  Active Problems: Nausea vomiting abdominal pain Leukocytosis Obesity Type 2 diabetes mellitus Hypertension Dyslipidemia Anxiety / depression  GERD Tobacco abuse   Discharge Condition:  stable  Diet recommendation: diabetic, heart  Filed Weights   11/21/19 0550 11/21/19 1528  Weight: 90.7 kg 93.9 kg    History of present illness:  [name] a 54 y.o.femalewith medical history significant ofanxiety, depression, arthritis, fibromyalgia, GERD, T2DM/IDDM, HTN, HLD, fatty liver and history of pancreatitis who presents with abdominal pain with nausea vomiting. Patient reports she was in usual state of health up until 2 days ago when she developed sudden onset nausea/vomiting. She reports accompanying epigastric pain and left-sided abdominal pain.  Hospital Course:    Acute/Refractory nausea vomiting, - likely food poisoning, this started after eating at [facility], viral gastroenteritis is also possibility - CT abdomen pelvis was unremarkable, she was treated with bowel rest, IV fluids, antiemetics - clinically improved with supportive care - tolerating diet today when  I picked up her care from my partner and anxious to go home - advised to follow-up with her gastroenterologist Dr. Collene Mares in 1 month - counseled about polypharmacy, patient is on numerous sedating medications, discussed with patient to take her medication list her PCP and sort through all her meds  Leukocytosis - reactive, improved  IDDM/T2DM - continue Lantus, resume oral hypoglycemics at [facility]  Hyperlipidemia -Continue Simvastatin 20 mg po Daily    Hypertension -Continue Metoprolol Succinate 50 mg p.o. daily  Anxiety and Depression -Continue Viibryd, escitalopram and amitriptyline - continue low-dose alprazolam  Hepatic Steatosis -Continue to  Encourage weight loss  GERD -Continue PPI with pantoprazole 40 mg p.o. twice daily  Tobacco Use Disorder -Smoking Cessastion Counseling given  Obesity -Estimated body mass index is 34.45 kg/    Discharge Exam: Vitals:   11/23/19 2234 11/24/19 0551  BP: (!) 165/89 139/68  Pulse: (!) 55 (!) 59  Resp: 16 17  Temp: 98 F (36.7 C) 98.6 F (37 C)  SpO2: 100% 96%    General: AAOx3 Cardiovascular:  S1-S2, regular rate r Respiratory: clear Discharge Instructions   Discharge Instructions    Diet - low sodium heart healthy   Complete by: As directed    Diet Carb Modified   Complete by: As directed    Increase activity slowly   Complete by: As directed      Allergies as of 11/24/2019   No Known Allergies     Medication List    STOP taking these medications   baclofen 10 MG tablet Commonly known as: LIORESAL   diazepam 5 MG tablet Commonly known as: VALIUM   esomeprazole 40 MG capsule Commonly known as: NEXIUM   famotidine 20 MG tablet Commonly known as: PEPCID   gabapentin 100 MG capsule Commonly known as: NEURONTIN   lidocaine 2 % solution Commonly known as: XYLOCAINE   lidocaine 5 % Commonly known as: Lidoderm   loperamide 2 MG capsule Commonly known as: IMODIUM   methocarbamol 500 MG tablet Commonly known as: ROBAXIN   metoCLOPramide 10 MG tablet Commonly known as: REGLAN   oxycodone 5 MG capsule Commonly known as: OXY-IR   oxyCODONE 5 MG immediate release tablet Commonly known as: Roxicodone  pantoprazole 20 MG tablet Commonly known as: PROTONIX   sucralfate 1 GM/10ML suspension Commonly known as: Carafate     TAKE these medications   albuterol 108 (90 Base) MCG/ACT inhaler Commonly known as: VENTOLIN HFA Inhale 2 puffs  into the lungs every 4 (four) hours as needed for wheezing.   ALPRAZolam 1 MG tablet Commonly known as: XANAX Take 1 mg by mouth daily as needed for anxiety.   amitriptyline 10 MG tablet Commonly known as: ELAVIL TAKE 1 TABLET(10 MG) BY MOUTH AT BEDTIME What changed: See the new instructions.   Azor 5-40 MG tablet Generic drug: amLODipine-olmesartan Take 1 tablet by mouth daily.   cyclobenzaprine 10 MG tablet Commonly known as: FLEXERIL Take 1 tablet (10 mg total) by mouth 2 (two) times daily as needed for muscle spasms.   Dexilant 60 MG capsule Generic drug: dexlansoprazole Take 60 mg by mouth daily.   diclofenac sodium 1 % Gel Commonly known as: Voltaren Apply 2 g topically 4 (four) times daily.   escitalopram 10 MG tablet Commonly known as: LEXAPRO Take 10 mg by mouth daily.   HYDROcodone-acetaminophen 5-325 MG tablet Commonly known as: NORCO/VICODIN Take 1 tablet by mouth every 6 (six) hours as needed for moderate pain or severe pain. What changed: reasons to take this   insulin glargine 100 unit/mL Sopn Commonly known as: LANTUS Inject 40 Units into the skin at bedtime.   Januvia 100 MG tablet Generic drug: sitaGLIPtin Take 100 mg by mouth daily.   metFORMIN 1000 MG tablet Commonly known as: GLUCOPHAGE Take 1,000 mg by mouth 2 (two) times daily.   metoprolol succinate 50 MG 24 hr tablet Commonly known as: TOPROL-XL Take 50 mg by mouth daily.   ondansetron 4 MG disintegrating tablet Commonly known as: Zofran ODT Take 1 tablet (4 mg total) by mouth every 8 (eight) hours as needed.   polyethylene glycol 17 g packet Commonly known as: MIRALAX / GLYCOLAX Take 17 g by mouth daily as needed for mild constipation.   simvastatin 20 MG tablet Commonly known as: ZOCOR Take 20 mg by mouth daily.   Trulicity [id number] [id number] Sopn Generic drug: Dulaglutide Inject 0.75 mg into the skin once a week.   Viibryd 40 MG Tabs Generic drug: Vilazodone HCl Take 40 mg  by mouth daily.   Vitamin D (Ergocalciferol) 1.25 MG (50000 UNIT) Caps capsule Commonly known as: DRISDOL Take 50,000 Units by mouth once a week.      No Known Allergies Follow-up Information    Nolene Ebbs, MD. Schedule an appointment as soon as possible for a visit in 1 week(s).   Specialty: Internal Medicine Contact information: [address] [place] [address] [phone]        Juanita Craver, MD Follow up in 1 month(s).   Specialty: Gastroenterology Contact information: [address] [place] [address] [phone]            The results of significant diagnostics from this hospitalization (including imaging, microbiology, ancillary and laboratory) are listed below for reference.    Significant Diagnostic Studies: DG Chest 2 View  Result Date: 12/12/2019 CLINICAL DATA:  Productive cough, shortness of breath, chills, fatigue, anorexia and rib pain for 3 days; history pneumonia, hypertension, asthma, bronchitis, diabetes mellitus, smoker EXAM: CHEST - 2 VIEW COMPARISON:  06/22/2015 FINDINGS: Upper-normal size of cardiac silhouette. Mediastinal contours and pulmonary vascularity normal. Minimal linear atelectasis versus scarring at lingula. Lungs otherwise clear. No acute infiltrate, pleural effusion or pneumothorax. Prior cervical  spine fusion. IMPRESSION: Minimal atelectasis or scarring at lingula. Electronically Signed   By: Lavonia Dana M.D.   On: 12/12/2019 09:53   CT ABDOMEN PELVIS W CONTRAST  Result Date: 11/21/2019 CLINICAL DATA:  Abdominal pain, vomiting EXAM: CT ABDOMEN AND PELVIS WITH CONTRAST TECHNIQUE: Multidetector CT imaging of the abdomen and pelvis was performed using the standard protocol following bolus administration of intravenous contrast. CONTRAST:  133mL OMNIPAQUE IOHEXOL 300 MG/ML  SOLN COMPARISON:  07/13/2019 and previous FINDINGS: Lower chest: Linear scarring or subsegmental atelectasis in the lung bases left greater  than right. No significant pleural or pericardial effusion. Hepatobiliary: No focal liver abnormality is seen. Status post cholecystectomy. No biliary dilatation. Pancreas: Unremarkable. No pancreatic ductal dilatation or surrounding inflammatory changes. Spleen: Normal in size without focal abnormality. Adrenals/Urinary Tract: Right adrenal unremarkable. Bulbous left adrenal with punctate calcification, stable since 08/03/2014. Stomach/Bowel: Stomach and small bowel are nondistended. Normal appendix. Colon is nondilated, unremarkable. Vascular/Lymphatic: Minimal calcified aortic plaque. No aneurysm or stenosis. No abdominal or pelvic adenopathy. Reproductive: Status post hysterectomy. No adnexal masses. Other: No ascites. No free air. Musculoskeletal: Chronic atrophy in the inferior aspect of the left rectus musculature. Small umbilical hernia containing only mesenteric fat. Stable changes of instrumented fusion L4-S1. Mild narrowing of the L3-4 interspace. Bilateral hip DJD. No fracture or worrisome bone lesion. IMPRESSION: 1. No acute findings. 2. Stable postop and degenerative changes as above. Aortic Atherosclerosis (ICD10-I70.0). Electronically Signed   By: Lucrezia Europe M.D.   On: 11/21/2019 13:04   DG ABD ACUTE 2+V W 1V CHEST  Result Date: 12/02/2019 CLINICAL DATA:  Left-sided abdominal pain for 2 days, pancreatitis EXAM: DG ABDOMEN ACUTE W/ 1V CHEST COMPARISON:  11/21/2019 FINDINGS: Supine and upright frontal views of the abdomen as well as an upright frontal view of the chest are obtained. The cardiac silhouette is unremarkable. No airspace disease, effusion, or pneumothorax. Bowel gas pattern is unremarkable. No obstruction or ileus. There are no masses or abnormal calcifications. No free gas in the greater peritoneal sac. Postsurgical changes are seen within the cervical and lumbar spine. Cholecystectomy clips right upper quadrant. IMPRESSION: 1. Unremarkable abdominal series. Electronically Signed    By: Randa Ngo M.D.   On: 12/02/2019 15:46    Microbiology: Recent Results (from the past 240 hour(s))  Novel Coronavirus, NAA ([facility])     Status: None   Collection Time: 12/12/19 10:05 AM   Specimen: Nasopharyngeal(NP) swabs in vial transport medium   NASOPHARYNGE  RELEASE  Result Value Ref Range Status   SARS-CoV-2, NAA Not Detected Not Detected Final    Comment: This nucleic acid amplification test was developed and its performance characteristics determined by [employer]. Nucleic acid amplification tests include RT-PCR and TMA. This test has not been FDA cleared or approved. This test has been authorized by FDA under an Emergency Use Authorization (EUA). This test is only authorized for the duration of time the declaration that circumstances exist justifying the authorization of the emergency use of in vitro diagnostic tests for detection of SARS-CoV-2 virus and/or diagnosis of COVID-19 infection under section 564(b)(1) of the Act, 21 U.S.C. [id number]) (1), unless the authorization is terminated or revoked sooner. When diagnostic testing is negative, the possibility of a false negative result should be considered in the context of a patient's recent exposures and the presence of clinical signs and symptoms consistent with COVID-19. An individual without symptoms of COVID-19 and who is not shedding SARS-CoV-2 virus wo uld expect to have a negative (not  detected) result in this assay.   SARS-COV-2, NAA 2 DAY TAT     Status: None   Collection Time: 12/12/19 10:05 AM   NASOPHARYNGE  RELEASE  Result Value Ref Range Status   SARS-CoV-2, NAA 2 DAY TAT Performed  Final     Labs: Basic Metabolic Panel: No results for input(s): NA, K, CL, CO2, GLUCOSE, BUN, CREATININE, CALCIUM, MG, PHOS in the last 168 hours. Liver Function Tests: No results for input(s): AST, ALT, ALKPHOS, BILITOT, PROT, ALBUMIN in the last 168 hours. No results for input(s): LIPASE, AMYLASE in  the last 168 hours. No results for input(s): AMMONIA in the last 168 hours. CBC: No results for input(s): WBC, NEUTROABS, HGB, HCT, MCV, PLT in the last 168 hours. Cardiac Enzymes: No results for input(s): CKTOTAL, CKMB, CKMBINDEX, TROPONINI in the last 168 hours. BNP: BNP (last 3 results) No results for input(s): BNP in the last 8760 hours.  ProBNP (last 3 results) No results for input(s): PROBNP in the last 8760 hours.  CBG: No results for input(s): GLUCAP in the last 168 hours.     Signed:  Domenic Polite MD.  [facility] 12/14/2019, 3:05 PM

## 2020-01-25 ENCOUNTER — Encounter (HOSPITAL_COMMUNITY): Payer: Self-pay | Admitting: Emergency Medicine

## 2020-01-25 ENCOUNTER — Emergency Department (HOSPITAL_COMMUNITY): Payer: Medicare Other

## 2020-01-25 ENCOUNTER — Emergency Department (HOSPITAL_COMMUNITY)
Admission: EM | Admit: 2020-01-25 | Discharge: 2020-01-25 | Disposition: A | Discharge status: Home / Self Care | Payer: Medicare Other | Attending: Emergency Medicine | Admitting: Emergency Medicine

## 2020-01-25 DIAGNOSIS — F17210 Nicotine dependence, cigarettes, uncomplicated: Secondary | ICD-10-CM | POA: Diagnosis not present

## 2020-01-25 DIAGNOSIS — I10 Essential (primary) hypertension: Secondary | ICD-10-CM | POA: Diagnosis not present

## 2020-01-25 DIAGNOSIS — Z79899 Other long term (current) drug therapy: Secondary | ICD-10-CM | POA: Diagnosis not present

## 2020-01-25 DIAGNOSIS — R1010 Upper abdominal pain, unspecified: Secondary | ICD-10-CM | POA: Diagnosis not present

## 2020-01-25 DIAGNOSIS — R112 Nausea with vomiting, unspecified: Secondary | ICD-10-CM | POA: Diagnosis not present

## 2020-01-25 DIAGNOSIS — R109 Unspecified abdominal pain: Secondary | ICD-10-CM | POA: Diagnosis present

## 2020-01-25 LAB — URINALYSIS, ROUTINE W REFLEX MICROSCOPIC
Bacteria, UA: NONE SEEN
Bilirubin Urine: NEGATIVE
Glucose, UA: NEGATIVE mg/dL
Ketones, ur: 20 mg/dL — AB
Leukocytes,Ua: NEGATIVE
Nitrite: NEGATIVE
Protein, ur: NEGATIVE mg/dL
Specific Gravity, Urine: 1.017 (ref 1.005–1.030)
pH: 5 (ref 5.0–8.0)

## 2020-01-25 LAB — COMPREHENSIVE METABOLIC PANEL
ALT: 17 U/L (ref 0–44)
AST: 21 U/L (ref 15–41)
Albumin: 4.6 g/dL (ref 3.5–5.0)
Alkaline Phosphatase: 103 U/L (ref 38–126)
Anion gap: 10 (ref 5–15)
BUN: 9 mg/dL (ref 6–20)
CO2: 25 mmol/L (ref 22–32)
Calcium: 9.2 mg/dL (ref 8.9–10.3)
Chloride: 104 mmol/L (ref 98–111)
Creatinine, Ser: 0.54 mg/dL (ref 0.44–1.00)
GFR calc Af Amer: >60 mL/min (ref >60)
GFR calc non Af Amer: >60 mL/min (ref >60)
Glucose, Bld: 157 mg/dL — ABNORMAL HIGH (ref 70–99)
Potassium: 4.3 mmol/L (ref 3.5–5.1)
Sodium: 139 mmol/L (ref 135–145)
Total Bilirubin: 1 mg/dL (ref 0.3–1.2)
Total Protein: 7.9 g/dL (ref 6.5–8.1)

## 2020-01-25 LAB — CBC
HCT: 45.2 % (ref 36.0–46.0)
Hemoglobin: 14.6 g/dL (ref 12.0–15.0)
MCH: 27.4 pg (ref 26.0–34.0)
MCHC: 32.3 g/dL (ref 30.0–36.0)
MCV: 84.8 fL (ref 80.0–100.0)
Platelets: 363 10*3/uL (ref 150–400)
RBC: 5.33 MIL/uL — ABNORMAL HIGH (ref 3.87–5.11)
RDW: 14.6 % (ref 11.5–15.5)
WBC: 12.6 10*3/uL — ABNORMAL HIGH (ref 4.0–10.5)
nRBC: 0 % (ref 0.0–0.2)

## 2020-01-25 LAB — LIPASE, BLOOD: Lipase: 51 U/L (ref 11–51)

## 2020-01-25 MED ORDER — DEXILANT 60 MG PO CPDR: 60.0000 mg | DELAYED_RELEASE_CAPSULE | Freq: Every day | ORAL | 0 refills | Status: DC

## 2020-01-25 MED ORDER — ALUM & MAG HYDROXIDE-SIMETH 200-200-20 MG/5ML PO SUSP
30.0000 mL | Freq: Once | ORAL | Status: AC
  Administered 2020-01-25: 30 mL via ORAL
  Filled 2020-01-25: qty 30

## 2020-01-25 MED ORDER — SODIUM CHLORIDE (PF) 0.9 % IJ SOLN
INTRAMUSCULAR | Status: AC
  Filled 2020-01-25: qty 50

## 2020-01-25 MED ORDER — LIDOCAINE VISCOUS HCL 2 % MT SOLN
15.0000 mL | Freq: Once | OROMUCOSAL | Status: AC
  Administered 2020-01-25: 15 mL via ORAL
  Filled 2020-01-25: qty 15

## 2020-01-25 MED ORDER — PANTOPRAZOLE SODIUM 40 MG IV SOLR
40.0000 mg | Freq: Once | INTRAVENOUS | Status: AC
  Administered 2020-01-25: 40 mg via INTRAVENOUS
  Filled 2020-01-25: qty 40

## 2020-01-25 MED ORDER — ONDANSETRON 4 MG PO TBDP
ORAL_TABLET | ORAL | 0 refills | Status: DC
Start: 2020-01-25 — End: 2020-04-24

## 2020-01-25 MED ORDER — SODIUM CHLORIDE 0.9 % IV BOLUS
1000.0000 mL | Freq: Once | INTRAVENOUS | Status: AC
  Administered 2020-01-25: 1000 mL via INTRAVENOUS

## 2020-01-25 MED ORDER — SUCRALFATE 1 GM/10ML PO SUSP
1.0000 g | Freq: Three times a day (TID) | ORAL | 0 refills | Status: DC
Start: 2020-01-25 — End: 2020-04-24

## 2020-01-25 MED ORDER — IOHEXOL 300 MG/ML  SOLN
100.0000 mL | Freq: Once | INTRAMUSCULAR | Status: AC | PRN
  Administered 2020-01-25: 100 mL via INTRAVENOUS

## 2020-01-25 MED ORDER — ONDANSETRON HCL 4 MG/2ML IJ SOLN
4.0000 mg | Freq: Once | INTRAMUSCULAR | Status: AC
  Administered 2020-01-25: 4 mg via INTRAVENOUS
  Filled 2020-01-25: qty 2

## 2020-01-25 MED ORDER — HYDROMORPHONE HCL 1 MG/ML IJ SOLN
1.0000 mg | Freq: Once | INTRAMUSCULAR | Status: AC
  Administered 2020-01-25: 1 mg via INTRAVENOUS
  Filled 2020-01-25: qty 1

## 2020-01-25 MED ORDER — MORPHINE SULFATE (PF) 4 MG/ML IV SOLN
4.0000 mg | Freq: Once | INTRAVENOUS | Status: AC
  Administered 2020-01-25: 4 mg via INTRAVENOUS
  Filled 2020-01-25: qty 1

## 2020-01-25 NOTE — ED Provider Notes (Signed)
[facility] DEPT Provider Note   CSN: [id number] Arrival date & time: 01/25/20  0737     History Chief Complaint  Patient presents with  . Abdominal Pain    [name] is a 54 y.o. female.  [name] is a 54 y.o. female with a history of hypertension, GERD, fibromyalgia, diabetes, cryptitis, chronic back pain, previous cholecystectomy, who presents to the ED for evaluation of upper abdominal pain that has been going on for about 4 days now.  She reports constant and worsening upper abdominal pain with nausea and vomiting starting yesterday.  She states she is not been able to keep anything down.  She reports this feels similar to the episode of pancreatitis she had after they took out her gallbladder.  She denies any fevers or chills.  Denies lower abdominal pain.  Does report some diarrhea, has not noted any blood in her stool.  Has not taken any medication prior to arrival.  Reports that the upper pain radiates up into her chest but she has not noticed separate chest pain, shortness of breath, cough.  Denies any urinary symptoms.  Denies alcohol use.  No other aggravating or alleviating factors.        Past Medical History:  Diagnosis Date  . Anxiety   . Arthritis    rheumatoid , osteoarthritis  . Bronchitis   . Chronic back pain   . Depression   . Diabetes mellitus   . Fatty liver   . Fibromyalgia   . GERD (gastroesophageal reflux disease)   . Headache   . Hypertension   . Pancreatitis   . Pneumonia   . Rotator cuff tear, right     Patient Active Problem List   Diagnosis Date Noted  . Abdominal pain 11/23/2019  . Acute pancreatitis 11/21/2019  . Complete tear of right rotator cuff 07/22/2017    Class: Chronic  . Retained orthopedic hardware 07/22/2017    Class: Chronic  . Cervical disc herniation 07/21/2017    Class: Acute  . Status post cervical spinal fusion 07/21/2017  . Chronic back pain     Past  Surgical History:  Procedure Laterality Date  . ABDOMINAL HYSTERECTOMY    . ANTERIOR CERVICAL DECOMP/DISCECTOMY FUSION N/A 07/21/2017   Procedure: ANTERIOR CERVICAL DISCECTOMY AND FUSION C6-7, REMOVAL OF SCREW C6 PLATE, DEPUY ZERO-P IMPLANT, LOCAL BONE GRAFT, ALLOGRAFT BONE GRAFT, VIVIGEN;  Surgeon: Jessy Oto, MD;  Location: [place];  Service: Orthopedics;  Laterality: N/A;  . BACK SURGERY     laminectomy  . BONE GRAFT HIP ILIAC CREST    . BREAST BIOPSY    . CARPAL TUNNEL RELEASE    . CERVICAL DISCECTOMY  07/21/2017  . CESAREAN SECTION    . CHOLECYSTECTOMY    . COLONOSCOPY    . frontal fusion    . neck fusion     patient reported  . SHOULDER ARTHROSCOPY WITH SUBACROMIAL DECOMPRESSION, ROTATOR CUFF REPAIR AND BICEP TENDON REPAIR Right 10/31/2017   Procedure: RIGHT SHOULDER ARTHROSCOPY, MANIPULATION UNDER ANESTHESIA, BICEPS TENODESIS, AND MINI OPEN ROTATOR CUFF TEAR REPAIR;  Surgeon: Meredith Pel, MD;  Location: [place];  Service: Orthopedics;  Laterality: Right;     OB History   No obstetric history on file.     Family History  Problem Relation Age of Onset  . Hypertension Father   . Renal Disease Father   . Diabetes Mother   . Hypertension Mother   . Edema Mother   .  Diabetes Sister   . Diabetes Brother     Social History   Tobacco Use  . Smoking status: Current Every Day Smoker    Packs/day: 0.50    Types: Cigarettes  . Smokeless tobacco: Never Used  Vaping Use  . Vaping Use: Some days  Substance Use Topics  . Alcohol use: No  . Drug use: Yes    Types: Marijuana    Home Medications Prior to Admission medications   Medication Sig Start Date End Date Taking? Authorizing Provider  albuterol (PROVENTIL HFA;VENTOLIN HFA) 108 (90 BASE) MCG/ACT inhaler Inhale 2 puffs into the lungs every 4 (four) hours as needed for wheezing.   Yes [provider]  albuterol (PROVENTIL) (2.5 MG/3ML) 0.083% nebulizer solution Take 3 mLs (2.5 mg total) by nebulization  every 6 (six) hours as needed for wheezing or shortness of breath. 12/12/19  Yes Yu, Amy V, PA-C  ALPRAZolam Duanne Moron) 1 MG tablet Take 1 mg by mouth daily as needed for anxiety. 10/23/19  Yes [provider]  AZOR 5-40 MG per tablet Take 1 tablet by mouth daily.  02/06/14  Yes [provider]  cyclobenzaprine (FLEXERIL) 10 MG tablet Take 1 tablet (10 mg total) by mouth 2 (two) times daily as needed for muscle spasms. 07/13/19  Yes Montine Circle, PA-C  insulin glargine (LANTUS) 100 unit/mL SOPN Inject 40 Units into the skin at bedtime.    Yes [provider]  metFORMIN (GLUCOPHAGE) 1000 MG tablet Take 1,000 mg by mouth 2 (two) times daily. 07/29/17  Yes [provider]  metoprolol succinate (TOPROL-XL) 50 MG 24 hr tablet Take 50 mg by mouth daily. 07/21/14  Yes [provider]  phentermine (ADIPEX-P) 37.5 MG tablet Take 37.5 mg by mouth daily. 01/19/20  Yes [provider]  promethazine (PHENERGAN) 25 MG tablet Take 1 tablet (25 mg total) by mouth every 6 (six) hours as needed for nausea or vomiting. 12/02/19  Yes Harris, Abigail, PA-C  simvastatin (ZOCOR) 20 MG tablet Take 20 mg by mouth daily.    Yes [provider]  TRULICITY 2.70 WC/3.7SE SOPN Inject 0.75 mg into the skin once a week. 11/06/19  Yes [provider]  VIIBRYD 40 MG TABS Take 40 mg by mouth daily. 06/03/19  Yes [provider]  DEXILANT 60 MG capsule Take 1 capsule (60 mg total) by mouth daily. 01/25/20   Jacqlyn Larsen, PA-C  ondansetron (ZOFRAN ODT) 4 MG disintegrating tablet 4mg  ODT q4 hours prn nausea/vomit 01/25/20   Jacqlyn Larsen, PA-C  sucralfate (CARAFATE) 1 GM/10ML suspension Take 10 mLs (1 g total) by mouth 4 (four) times daily -  with meals and at bedtime. 01/25/20   Jacqlyn Larsen, PA-C    Allergies    Patient has no known allergies.  Review of Systems   Review of Systems  Constitutional: Negative for chills and fever.  HENT: Negative.     Respiratory: Negative for cough and shortness of breath.   Cardiovascular: Negative for chest pain.  Gastrointestinal: Positive for abdominal pain, nausea and vomiting. Negative for blood in stool, constipation and diarrhea.  Genitourinary: Negative for dysuria, frequency, vaginal bleeding and vaginal discharge.  Musculoskeletal: Negative for arthralgias and myalgias.  Neurological: Negative for dizziness, syncope and light-headedness.  All other systems reviewed and are negative.   Physical Exam Updated Vital Signs BP (!) 150/95 (BP Location: Right Arm)   Pulse (!) 101   Temp 98.7 F (37.1 C) (Oral)   Resp 16  SpO2 97%   Physical Exam Vitals and nursing note reviewed.  Constitutional:      General: She is not in acute distress.    Appearance: She is well-developed. She is not diaphoretic.     Comments: Patient appears uncomfortable but is in no acute distress  HENT:     Head: Normocephalic and atraumatic.     Mouth/Throat:     Mouth: Mucous membranes are moist.     Pharynx: Oropharynx is clear.  Eyes:     General:        Right eye: No discharge.        Left eye: No discharge.     Pupils: Pupils are equal, round, and reactive to light.  Cardiovascular:     Rate and Rhythm: Normal rate and regular rhythm.     Heart sounds: Normal heart sounds.  Pulmonary:     Effort: Pulmonary effort is normal. No respiratory distress.     Breath sounds: Normal breath sounds. No wheezing or rales.     Comments: Respirations equal and unlabored, patient able to speak in full sentences, lungs clear to auscultation bilaterally Abdominal:     General: Bowel sounds are normal. There is no distension.     Palpations: Abdomen is soft. There is no mass.     Tenderness: There is abdominal tenderness in the right upper quadrant, epigastric area, periumbilical area and left upper quadrant. There is no guarding.     Comments: Abdomen is soft, nondistended, bowel sounds present in all quadrants,  there is diffuse tenderness across the upper abdomen and periumbilical region without guarding, no lower abdominal tenderness or CVA tenderness  Musculoskeletal:        General: No deformity.     Cervical back: Neck supple.  Skin:    General: Skin is warm and dry.     Capillary Refill: Capillary refill takes less than 2 seconds.  Neurological:     Mental Status: She is alert.     Coordination: Coordination normal.     Comments: Speech is clear, able to follow commands Moves extremities without ataxia, coordination intact  Psychiatric:        Mood and Affect: Mood normal.        Behavior: Behavior normal.     ED Results / Procedures / Treatments   Labs (all labs ordered are listed, but only abnormal results are displayed) Labs Reviewed  COMPREHENSIVE METABOLIC PANEL - Abnormal; Notable for the following components:      Result Value   Glucose, Bld 157 (*)    All other components within normal limits  CBC - Abnormal; Notable for the following components:   WBC 12.6 (*)    RBC 5.33 (*)    All other components within normal limits  URINALYSIS, ROUTINE W REFLEX MICROSCOPIC - Abnormal; Notable for the following components:   Hgb urine dipstick SMALL (*)    Ketones, ur 20 (*)    All other components within normal limits  LIPASE, BLOOD    EKG EKG Interpretation  Date/Time:  Tuesday January 25 2020 10:23:05 EDT Ventricular Rate:  94 PR Interval:    QRS Duration: 96 QT Interval:  394 QTC Calculation: 493 R Axis:   -2 Text Interpretation: Sinus rhythm Low voltage, precordial leads Anteroseptal infarct, age indeterminate Confirmed by Virgel Manifold [phone]) on 01/25/2020 1:11:32 PM   Radiology CT ABDOMEN PELVIS W CONTRAST  Result Date: 01/25/2020 CLINICAL DATA:  Acute epigastric pain. EXAM: CT ABDOMEN AND PELVIS WITH CONTRAST TECHNIQUE:  Multidetector CT imaging of the abdomen and pelvis was performed using the standard protocol following bolus administration of intravenous  contrast. CONTRAST:  179mL OMNIPAQUE IOHEXOL 300 MG/ML  SOLN COMPARISON:  CT abdomen and pelvis with contrast 11/21/2019 FINDINGS: Lower chest: Mild dependent atelectasis is present bilaterally. Heart size is normal. No significant pleural or pericardial effusion is present. Hepatobiliary: No focal liver abnormality is seen. Status post cholecystectomy. No biliary dilatation. Pancreas: Unremarkable. No pancreatic ductal dilatation or surrounding inflammatory changes. Spleen: Normal in size without focal abnormality. Adrenals/Urinary Tract: Adrenal glands are normal bilaterally. Kidneys and ureters are unremarkable. No stone or mass lesion is present. The urinary bladder is within normal limits. Stomach/Bowel: The stomach and duodenum are within normal limits. Small bowel is unremarkable. Terminal ileum is within normal limits. The appendix is visualized and normal. The ascending and transverse colon within normal limits. Descending and sigmoid colon are normal. Vascular/Lymphatic: Atherosclerotic calcifications are present aorta and branch vessels without aneurysm. Significant adenopathy is present. Reproductive: Status post hysterectomy. No adnexal masses. Other: Left paramidline laparotomy is present. Asymmetric atrophy of the rectus muscle is again noted. Significant ventral hernia is present. No free fluid or free air is present. Musculoskeletal: Lumbar fusion at L4-5 and L5-S1 is solid. Adjacent level disease is suspected at L3-4. Vertebral body heights are maintained. No focal lytic or blastic lesions are present. Degenerative changes are present both hips. IMPRESSION: 1. No acute or focal lesion to explain the patient's symptoms. 2. Cholecystectomy and hysterectomy. 3. Lumbar fusion at L4-5 and L5-S1. 4. 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 5. Aortic Atherosclerosis (ICD10-I70.0). Electronically Signed   By: San Morelle M.D.   On: 01/25/2020 11:56    Procedures Procedures (including critical care  time)  Medications Ordered in ED Medications  sodium chloride (PF) 0.9 % injection (has no administration in time range)  sodium chloride 0.9 % bolus 1,000 mL (0 mLs Intravenous Stopped 01/25/20 1239)  ondansetron (ZOFRAN) injection 4 mg (4 mg Intravenous Given 01/25/20 1030)  morphine 4 MG/ML injection 4 mg (4 mg Intravenous Given 01/25/20 1030)  iohexol (OMNIPAQUE) 300 MG/ML solution 100 mL (100 mLs Intravenous Contrast Given 01/25/20 1131)  HYDROmorphone (DILAUDID) injection 1 mg (1 mg Intravenous Given 01/25/20 1238)  pantoprazole (PROTONIX) injection 40 mg (40 mg Intravenous Given 01/25/20 1237)  alum & mag hydroxide-simeth (MAALOX/MYLANTA) 200-200-20 MG/5ML suspension 30 mL (30 mLs Oral Given 01/25/20 1236)    And  lidocaine (XYLOCAINE) 2 % viscous mouth solution 15 mL (15 mLs Oral Given 01/25/20 1237)    ED Course  I have reviewed the triage vital signs and the nursing notes.  Pertinent labs & imaging results that were available during my care of the patient were reviewed by me and considered in my medical decision making (see chart for details).    MDM Rules/Calculators/A&P                         Patient presents to the ED with complaints of abdominal pain. Patient nontoxic appearing, in no apparent distress, patient mildly tachycardic and hypertensive on arrival but vital stable, afebrile. On exam patient tender to palpation across the upper abdomen and periumbilical region, no peritoneal signs. Will evaluate with labs and CT abdomen pelvis. Analgesics, anti-emetics, and fluids administered.   ER work-up reviewed:  CBC: Mild leukocytosis of 12.5, normal hemoglobin CMP: Glucose 157, no other electrolyte derangements, normal renal and liver function Lipase: WNL UA: No signs of infection or hematuria  CT:  No acute or focal lesion to explain patient's symptoms, history of cholecystectomy and hysterectomy, lumbar fusion and degenerative disc disease.  No evidence of peripancreatic  inflammation.  On repeat abdominal exam patient remains without peritoneal signs, doubt cholecystitis, pancreatitis, diverticulitis, appendicitis, bowel obstruction/perforation.  Suspect pain may be due to GERD and gastritis, she got significant relief with GI cocktail.  She was previously on a PPI but does not think she is taking this anymore, will restart patient's Dexilant and prescribed Carafate I have encouraged her to follow-up with GI.  Patient tolerating PO in the emergency department. Will discharge home with supportive measures. I discussed results, treatment plan, need for PCP follow-up, and return precautions with the patient. Provided opportunity for questions, patient confirmed understanding and is in agreement with plan.    Final Clinical Impression(s) / ED Diagnoses Final diagnoses:  Pain of upper abdomen  Non-intractable vomiting with nausea, unspecified vomiting type    Rx / DC Orders ED Discharge Orders         Ordered    DEXILANT 60 MG capsule  Daily     Discontinue  Reprint     01/25/20 1353    sucralfate (CARAFATE) 1 GM/10ML suspension  3 times daily with meals & bedtime     Discontinue  Reprint     01/25/20 1353    ondansetron (ZOFRAN ODT) 4 MG disintegrating tablet     Discontinue  Reprint     01/25/20 1404           Jacqlyn Larsen, [place] 01/25/20 1433    Virgel Manifold, MD 01/28/20 1201

## 2020-01-25 NOTE — Discharge Instructions (Signed)
Take Dexilant and acid reducer daily, you can also use Carafate before meals and at bedtime to help coat your esophagus and stomach you can use over-the-counter Maalox for breakthrough pain.  Please follow-up with GI for further evaluation of these episodes.  You can use Zofran at home as needed for nausea.  Your lab work and CT scan today are reassuring.  Return for new or worsening symptoms.

## 2020-01-25 NOTE — ED Notes (Signed)
Patient refused fluid/po challenge.

## 2020-01-25 NOTE — ED Notes (Signed)
Patient unable to sign signature pad not working.

## 2020-01-25 NOTE — ED Triage Notes (Signed)
Per EMS-pancreatitis flare up since Sunday-N/V started last night

## 2020-02-09 ENCOUNTER — Other Ambulatory Visit: Payer: Self-pay

## 2020-02-09 ENCOUNTER — Encounter: Payer: Self-pay | Admitting: Emergency Medicine

## 2020-02-09 ENCOUNTER — Ambulatory Visit
Admission: EM | Admit: 2020-02-09 | Discharge: 2020-02-09 | Disposition: A | Discharge status: Home / Self Care | Payer: Medicare Other | Attending: Physician Assistant | Admitting: Physician Assistant

## 2020-02-09 DIAGNOSIS — M5441 Lumbago with sciatica, right side: Secondary | ICD-10-CM

## 2020-02-09 MED ORDER — MELOXICAM 7.5 MG PO TABS
7.5000 mg | ORAL_TABLET | Freq: Every day | ORAL | 0 refills | Status: DC
Start: 2020-02-09 — End: 2020-04-24

## 2020-02-09 MED ORDER — TIZANIDINE HCL 2 MG PO TABS
2.0000 mg | ORAL_TABLET | Freq: Three times a day (TID) | ORAL | 0 refills | Status: DC | PRN
Start: 2020-02-09 — End: 2020-10-19

## 2020-02-09 MED ORDER — KETOROLAC TROMETHAMINE 15 MG/ML IJ SOLN
15.0000 mg | Freq: Once | INTRAMUSCULAR | Status: AC
  Administered 2020-02-09: 15 mg via INTRAMUSCULAR

## 2020-02-09 NOTE — ED Triage Notes (Signed)
Pt presents to [facility] for assessment of right thigh pain with some associated lower right back pain starting 1 week ago.  Denies known injury.  States she takes her insulin shots in this thigh, and the thigh was hard and warm to touch.  Has improved some since she stopped using it for shots.

## 2020-02-09 NOTE — ED Provider Notes (Signed)
[facility]    CSN: [id number] Arrival date & time: 02/09/20  0804      History   Chief Complaint Chief Complaint  Patient presents with  . Leg Pain    HPI [name] is a 54 y.o. female.   54 year old female with history of IDDM, HTN, chronic back pain comes in for 1 week history of right thigh pain. Denies injury/trauma. Pain first started to the right back that radiate to the anterior hip. Now pain to the right thigh she describes as shooting pains. Certain positions can cause numbness to the anterior and lateral thigh. States used right thigh for insulin injections, and did notice some improvement. Denies fever, urinary symptoms. Denies saddle anesthesia, loss of bladder or bowel control.     Past Medical History:  Diagnosis Date  . Anxiety   . Arthritis    rheumatoid , osteoarthritis  . Bronchitis   . Chronic back pain   . Depression   . Diabetes mellitus   . Fatty liver   . Fibromyalgia   . GERD (gastroesophageal reflux disease)   . Headache   . Hypertension   . Pancreatitis   . Pneumonia   . Rotator cuff tear, right     Patient Active Problem List   Diagnosis Date Noted  . Abdominal pain 11/23/2019  . Acute pancreatitis 11/21/2019  . Complete tear of right rotator cuff 07/22/2017    Class: Chronic  . Retained orthopedic hardware 07/22/2017    Class: Chronic  . Cervical disc herniation 07/21/2017    Class: Acute  . Status post cervical spinal fusion 07/21/2017  . Chronic back pain     Past Surgical History:  Procedure Laterality Date  . ABDOMINAL HYSTERECTOMY    . ANTERIOR CERVICAL DECOMP/DISCECTOMY FUSION N/A 07/21/2017   Procedure: ANTERIOR CERVICAL DISCECTOMY AND FUSION C6-7, REMOVAL OF SCREW C6 PLATE, DEPUY ZERO-P IMPLANT, LOCAL BONE GRAFT, ALLOGRAFT BONE GRAFT, VIVIGEN;  Surgeon: Jessy Oto, MD;  Location: [place];  Service: Orthopedics;  Laterality: N/A;  . BACK SURGERY     laminectomy  . BONE GRAFT HIP ILIAC CREST     . BREAST BIOPSY    . CARPAL TUNNEL RELEASE    . CERVICAL DISCECTOMY  07/21/2017  . CESAREAN SECTION    . CHOLECYSTECTOMY    . COLONOSCOPY    . frontal fusion    . neck fusion     patient reported  . SHOULDER ARTHROSCOPY WITH SUBACROMIAL DECOMPRESSION, ROTATOR CUFF REPAIR AND BICEP TENDON REPAIR Right 10/31/2017   Procedure: RIGHT SHOULDER ARTHROSCOPY, MANIPULATION UNDER ANESTHESIA, BICEPS TENODESIS, AND MINI OPEN ROTATOR CUFF TEAR REPAIR;  Surgeon: Meredith Pel, MD;  Location: [place];  Service: Orthopedics;  Laterality: Right;    OB History   No obstetric history on file.      Home Medications    Prior to Admission medications   Medication Sig Start Date End Date Taking? Authorizing Provider  albuterol (PROVENTIL HFA;VENTOLIN HFA) 108 (90 BASE) MCG/ACT inhaler Inhale 2 puffs into the lungs every 4 (four) hours as needed for wheezing.    [provider]  albuterol (PROVENTIL) (2.5 MG/3ML) 0.083% nebulizer solution Take 3 mLs (2.5 mg total) by nebulization every 6 (six) hours as needed for wheezing or shortness of breath. 12/12/19   Tasia Catchings, Saki Legore V, PA-C  ALPRAZolam Duanne Moron) 1 MG tablet Take 1 mg by mouth daily as needed for anxiety. 10/23/19   [provider]  AZOR 5-40 MG per tablet  Take 1 tablet by mouth daily.  02/06/14   [provider]  DEXILANT 60 MG capsule Take 1 capsule (60 mg total) by mouth daily. 01/25/20   Jacqlyn Larsen, PA-C  insulin glargine (LANTUS) 100 unit/mL SOPN Inject 40 Units into the skin at bedtime.     [provider]  meloxicam (MOBIC) 7.5 MG tablet Take 1 tablet (7.5 mg total) by mouth daily. 02/09/20   Tasia Catchings, Jency Schnieders V, PA-C  metFORMIN (GLUCOPHAGE) 1000 MG tablet Take 1,000 mg by mouth 2 (two) times daily. 07/29/17   [provider]  metoprolol succinate (TOPROL-XL) 50 MG 24 hr tablet Take 50 mg by mouth daily. 07/21/14   [provider]  ondansetron (ZOFRAN ODT) 4 MG disintegrating tablet 4mg  ODT q4 hours prn  nausea/vomit 01/25/20   Jacqlyn Larsen, PA-C  phentermine (ADIPEX-P) 37.5 MG tablet Take 37.5 mg by mouth daily. 01/19/20   [provider]  promethazine (PHENERGAN) 25 MG tablet Take 1 tablet (25 mg total) by mouth every 6 (six) hours as needed for nausea or vomiting. 12/02/19   [name], PA-C  simvastatin (ZOCOR) 20 MG tablet Take 20 mg by mouth daily.     [provider]  sucralfate (CARAFATE) 1 GM/10ML suspension Take 10 mLs (1 g total) by mouth 4 (four) times daily -  with meals and at bedtime. 01/25/20   Jacqlyn Larsen, PA-C  tiZANidine (ZANAFLEX) 2 MG tablet Take 1 tablet (2 mg total) by mouth every 8 (eight) hours as needed for muscle spasms. 02/09/20   Eleah Lahaie V, PA-C  TRULICITY 8.11 BJ/4.7WG SOPN Inject 0.75 mg into the skin once a week. 11/06/19   [provider]  VIIBRYD 40 MG TABS Take 40 mg by mouth daily. 06/03/19   [provider]    Family History Family History  Problem Relation Age of Onset  . Hypertension Father   . Renal Disease Father   . Diabetes Mother   . Hypertension Mother   . Edema Mother   . Diabetes Sister   . Diabetes Brother     Social History Social History   Tobacco Use  . Smoking status: Current Every Day Smoker    Packs/day: 0.50    Types: Cigarettes  . Smokeless tobacco: Never Used  Vaping Use  . Vaping Use: Some days  Substance Use Topics  . Alcohol use: No  . Drug use: Yes    Types: Marijuana     Allergies   Patient has no known allergies.   Review of Systems Review of Systems  Reason unable to perform ROS: See HPI as above.     Physical Exam Triage Vital Signs ED Triage Vitals  Enc Vitals Group     BP      Pulse      Resp      Temp      Temp src      SpO2      Weight      Height      Head Circumference      Peak Flow      Pain Score      Pain Loc      Pain Edu?      Excl. in [place]?    No data found.  Updated Vital Signs BP (!) 169/99 (BP Location: Left Arm)   Pulse 62    Temp 98.2 F (36.8 C) (Oral)   Resp 18   SpO2 95%   Physical Exam Constitutional:  General: She is not in acute distress.    Appearance: She is well-developed. She is not diaphoretic.  HENT:     Head: Normocephalic and atraumatic.  Eyes:     Conjunctiva/sclera: Conjunctivae normal.     Pupils: Pupils are equal, round, and reactive to light.  Cardiovascular:     Rate and Rhythm: Normal rate and regular rhythm.     Heart sounds: Normal heart sounds. No murmur heard.  No friction rub. No gallop.   Pulmonary:     Effort: Pulmonary effort is normal. No accessory muscle usage or respiratory distress.     Breath sounds: Normal breath sounds. No stridor. No decreased breath sounds, wheezing, rhonchi or rales.  Musculoskeletal:     Comments: No tenderness on palpation of the spinous processes. Tenderness to palpation of right lumbar back/hip diffusely. Decreased ROM of back and hips due to pain. decreased passive ROM of hip due to pain. Sensation intact and equal bilaterally. Negative straight leg raise.   Right thigh without obvious swelling, erythema, warmth  Skin:    General: Skin is warm and dry.  Neurological:     Mental Status: She is alert and oriented to person, place, and time.     UC Treatments / Results  Labs (all labs ordered are listed, but only abnormal results are displayed) Labs Reviewed - No data to display  EKG   Radiology No results found.  Procedures Procedures (including critical care time)  Medications Ordered in UC Medications  ketorolac (TORADOL) 15 MG/ML injection 15 mg (has no administration in time range)    Initial Impression / Assessment and Plan / UC Course  I have reviewed the triage vital signs and the nursing notes.  Pertinent labs & imaging results that were available during my care of the patient were reviewed by me and considered in my medical decision making (see chart for details).    toradol injection in office today.  Patient's last a1c 6.8, but will trial NSAIDs prior to prednisone use. Mobic, tizanidine as directed. To follow up with orthopedics for further evaluation given patient history of chronic back pain. Return precautions given.  Final Clinical Impressions(s) / UC Diagnoses   Final diagnoses:  Acute right-sided low back pain with right-sided sciatica   ED Prescriptions    Medication Sig Dispense Auth. Provider   tiZANidine (ZANAFLEX) 2 MG tablet Take 1 tablet (2 mg total) by mouth every 8 (eight) hours as needed for muscle spasms. 15 tablet Glendon Fiser V, PA-C   meloxicam (MOBIC) 7.5 MG tablet Take 1 tablet (7.5 mg total) by mouth daily. 15 tablet Ok Edwards, PA-C     I have reviewed the PDMP during this encounter.   Ok Edwards, PA-C 02/09/20 [phone]

## 2020-02-09 NOTE — Discharge Instructions (Signed)
Toradol injection in office today. Start Mobic. Do not take ibuprofen (motrin/advil)/ naproxen (aleve) while on mobic. Tizanidine as needed, this can make you drowsy, so do not take if you are going to drive, operate heavy machinery, or make important decisions. Ice/heat compresses as needed. Follow up with PCP/orthopedics if symptoms worsen, changes for reevaluation. If experience numbness/tingling of the inner thighs, loss of bladder or bowel control, go to the emergency department for evaluation.

## 2020-02-17 ENCOUNTER — Ambulatory Visit (INDEPENDENT_AMBULATORY_CARE_PROVIDER_SITE_OTHER): Payer: Medicare Other | Admitting: Surgery

## 2020-02-17 ENCOUNTER — Ambulatory Visit: Payer: Self-pay

## 2020-02-17 ENCOUNTER — Encounter: Payer: Self-pay | Admitting: Surgery

## 2020-02-17 DIAGNOSIS — Z4889 Encounter for other specified surgical aftercare: Secondary | ICD-10-CM | POA: Diagnosis not present

## 2020-02-17 DIAGNOSIS — M5412 Radiculopathy, cervical region: Secondary | ICD-10-CM

## 2020-02-17 DIAGNOSIS — M542 Cervicalgia: Secondary | ICD-10-CM | POA: Diagnosis not present

## 2020-02-17 DIAGNOSIS — M4807 Spinal stenosis, lumbosacral region: Secondary | ICD-10-CM | POA: Diagnosis not present

## 2020-02-17 DIAGNOSIS — M545 Low back pain, unspecified: Secondary | ICD-10-CM

## 2020-02-17 DIAGNOSIS — M4316 Spondylolisthesis, lumbar region: Secondary | ICD-10-CM

## 2020-02-17 NOTE — Progress Notes (Signed)
Office Visit Note   [name]           Date of Birth: 10/30/1965           MRN: [id number] Visit Date: 02/17/2020              Requested by: Nolene Ebbs, MD [address] [place],  [place] [address] PCP: Nolene Ebbs, MD   Assessment & Plan: Visit Diagnoses:  1. Neck pain   2. Low back pain, unspecified back pain laterality, unspecified chronicity, unspecified whether sciatica present   3. Encounter for other specified surgical aftercare   4. Spinal stenosis of lumbosacral region   5. Spondylolisthesis of lumbar region   6. Radiculopathy, cervical region     Plan: With patient's worsening complaint of neck pain and bilateral upper extremity radiculopathy and low back pain with lower extremity radiculopathy that has failed conservative treatment I recommend getting cervical and lumbar spine MRIs and follow-up with Dr. Louanne Skye after completion to discuss results and further treatment options.  I again recommended getting NCV/EMG studies of the upper extremities as I did back in 2019 but again patient adamantly refused.  States that she did not want needles stuck in her arm.  I advised her the importance of getting the studies so that way we can better figure out exactly what is going on and Dr. Louanne Skye can make the best treatment recommendations.  He can discuss with her at next appointment as to whether or not NCV/EMG studies are indicated.  Patient asked about getting medication for her pain but I told her that I absolutely would not be prescribing any medication today since she has had multiple visits to emergency room for abdominal pain over the last several months and has not followed up with gastroenterologist as previously recommended.  Follow-Up Instructions: Return in about 3 weeks (around 03/09/2020) for With Dr. Louanne Skye to review cervical and lumbar MRI scans.   Orders:  Orders Placed This Encounter  Procedures  . XR Cervical Spine With Flex & Extend  . XR  Lumbar Spine Complete  . MR Cervical Spine w/o contrast  . MR Lumbar Spine W Wo Contrast   No orders of the defined types were placed in this encounter.     Procedures: No procedures performed   Clinical Data: No additional findings.   Subjective: Chief Complaint  Patient presents with  . Neck - Pain  . Lower Back - Pain    HPI 54 year old black female comes in today with multiple complaints.  She is complaining of neck pain with bilateral upper extremity radiculopathy and low back pain with right lower extremity radiculopathy.  Patient is status post C5-6 and C6-7 fusions with most recent neck surgery July 21, 2017 ANTERIOR CERVICAL DISCECTOMY AND FUSION C6-7, REMOVAL OF SCREW C6 PLATE, DEPUY ZERO-P IMPLANT, LOCAL BONE GRAFT, ALLOGRAFT BONE GRAFT, VIVIGEN.  Patient is also status post L4-5 and L5-S1 lumbar fusions by Dr. Louanne Skye several years ago.  She is complaining of ongoing neck pain with radiation down to both hands.  Persistent numbness and tingling in both arms that is worse when she goes to bed at night.  Patient was last seen by me February 17, 2018 and I made the diagnosis of: Visit Diagnoses:  1. Fusion of spine, cervical region   2. Upper extremity neuropathy, left   3. Upper extremity neuropathy, right   4. Chronic right shoulder pain   5. S/P rotator cuff repair    At that  visit I recommended and scheduled NCV/EMG studies of bilateral upper extremities to rule out cervical radiculopathy and bilateral cubital/carpal tunnel syndrome.  Patient elected not to have that study done because she did not want needles placed in her arm.  Her current complaint of the neck and upper extremities are similar to July 2019.  States that she has trouble holding items in the right hand and has a electrical feeling down her arm.  Dr. Louanne Skye ordered cervical spine MRI September 2019 and that report showed:  EXAM: MRI CERVICAL SPINE WITHOUT CONTRAST  TECHNIQUE: Multiplanar, multisequence  MR imaging of the cervical spine was performed. No intravenous contrast was administered.  COMPARISON:  Cervical spine radiograph 02/17/2018  Cervical spine MRI 06/26/2017  FINDINGS: Alignment: Normal  Vertebrae: C5-C7 ACDF  Cord: Normal signal and morphology.  Posterior Fossa, vertebral arteries, paraspinal tissues: Visualized posterior fossa is normal. Vertebral artery flow voids are preserved. No prevertebral soft tissue swelling.  Disc levels:  C1-C2 articulation is normal.  C2-C3: Normal.  C3-C4: Normal.  C4-C5: Normal.  C5-C6: Postfusion changes. No spinal canal or neural foraminal stenosis.  C6-C7: Postfusion changes with mild disc bulge. Mild left uncovertebral spurring. Mild left neural foraminal stenosis. No spinal canal stenosis.  C7-T1: Normal.  T1-T3 levels are imaged only in the sagittal plane and are normal.  IMPRESSION: 1. Since the prior MRI of 06/26/2017, the patient has undergone anterior fusion at the C6-7 level. There is no residual spinal canal stenosis. Mild left neural foraminal stenosis at C6-7 is unchanged. 2. C5-6 ACDF without spinal canal stenosis.  Patient states that she is also been having worsening low back problems.  Was seen in the emergency room February 09, 2020 due to increased low back pain and right lower extremity radiculopathy.  Was given Toradol IM injection and prescribed oral NSAID and muscle relaxer.  I reviewed patient's chart and she is also had multiple emergency department visits for abdominal pain over the last year.  States that she has not followed up with a gastroenterologist for her GI issues.   Review of Systems No current cardiac pulmonary issues  Objective: Vital Signs: There were no vitals taken for this visit.  Physical Exam HENT:     Head: Normocephalic and atraumatic.  Eyes:     Extraocular Movements: Extraocular movements intact.     Pupils: Pupils are equal, round, and reactive to  light.  Pulmonary:     Effort: No respiratory distress.  Musculoskeletal:     Comments: Sore spine she has some decreased range of motion due to stiffness.  Moderate to marked bilateral brachial plexus trapezius and scapular border tenderness.  Bilateral shoulder exam unremarkable.  Neurologically intact throughout.  Bilateral lumbar paraspinal tenderness.  Negative logroll bilateral hips.  Positive right greater than left straight leg raise.  Bilateral calves nontender.  No focal motor deficits.  Neurological:     General: No focal deficit present.     Mental Status: She is oriented to person, place, and time.  Psychiatric:        Mood and Affect: Mood normal.     Ortho Exam  Specialty Comments:  No specialty comments available.  Imaging: XR Cervical Spine With Flex & Extend  Result Date: 02/17/2020 Cervical spine her hardware is intact.  She has adjacent segment disease at C4-5 with the space narrowing and vertebral spurs.  XR Lumbar Spine Complete  Result Date: 02/17/2020 Lumbar spine cages intact at L4-5 and L5-S1.  She does have a few millimeters  of L3 anterolisthesis.  No acute finding.    PMFS History: Patient Active Problem List   Diagnosis Date Noted  . Abdominal pain 11/23/2019  . Acute pancreatitis 11/21/2019  . Complete tear of right rotator cuff 07/22/2017    Class: Chronic  . Retained orthopedic hardware 07/22/2017    Class: Chronic  . Cervical disc herniation 07/21/2017    Class: Acute  . Status post cervical spinal fusion 07/21/2017  . Chronic back pain    Past Medical History:  Diagnosis Date  . Anxiety   . Arthritis    rheumatoid , osteoarthritis  . Bronchitis   . Chronic back pain   . Depression   . Diabetes mellitus   . Fatty liver   . Fibromyalgia   . GERD (gastroesophageal reflux disease)   . Headache   . Hypertension   . Pancreatitis   . Pneumonia   . Rotator cuff tear, right     Family History  Problem Relation Age of Onset  .  Hypertension Father   . Renal Disease Father   . Diabetes Mother   . Hypertension Mother   . Edema Mother   . Diabetes Sister   . Diabetes Brother     Past Surgical History:  Procedure Laterality Date  . ABDOMINAL HYSTERECTOMY    . ANTERIOR CERVICAL DECOMP/DISCECTOMY FUSION N/A 07/21/2017   Procedure: ANTERIOR CERVICAL DISCECTOMY AND FUSION C6-7, REMOVAL OF SCREW C6 PLATE, DEPUY ZERO-P IMPLANT, LOCAL BONE GRAFT, ALLOGRAFT BONE GRAFT, VIVIGEN;  Surgeon: Jessy Oto, MD;  Location: [facility];  Service: Orthopedics;  Laterality: N/A;  . BACK SURGERY     laminectomy  . BONE GRAFT HIP ILIAC CREST    . BREAST BIOPSY    . CARPAL TUNNEL RELEASE    . CERVICAL DISCECTOMY  07/21/2017  . CESAREAN SECTION    . CHOLECYSTECTOMY    . COLONOSCOPY    . frontal fusion    . neck fusion     patient reported  . SHOULDER ARTHROSCOPY WITH SUBACROMIAL DECOMPRESSION, ROTATOR CUFF REPAIR AND BICEP TENDON REPAIR Right 10/31/2017   Procedure: RIGHT SHOULDER ARTHROSCOPY, MANIPULATION UNDER ANESTHESIA, BICEPS TENODESIS, AND MINI OPEN ROTATOR CUFF TEAR REPAIR;  Surgeon: Meredith Pel, MD;  Location: [facility];  Service: Orthopedics;  Laterality: Right;   Social History   Occupational History  . Not on file  Tobacco Use  . Smoking status: Current Every Day Smoker    Packs/day: 0.50    Types: Cigarettes  . Smokeless tobacco: Never Used  Vaping Use  . Vaping Use: Some days  Substance and Sexual Activity  . Alcohol use: No  . Drug use: Yes    Types: Marijuana  . Sexual activity: Not on file

## 2020-04-12 ENCOUNTER — Emergency Department (HOSPITAL_COMMUNITY)
Admission: EM | Admit: 2020-04-12 | Discharge: 2020-04-13 | Disposition: A | Discharge status: Home / Self Care | Payer: Medicare Other | Attending: Emergency Medicine | Admitting: Emergency Medicine

## 2020-04-12 ENCOUNTER — Encounter (HOSPITAL_COMMUNITY): Payer: Self-pay

## 2020-04-12 DIAGNOSIS — Z5321 Procedure and treatment not carried out due to patient leaving prior to being seen by health care provider: Secondary | ICD-10-CM | POA: Insufficient documentation

## 2020-04-12 DIAGNOSIS — R1110 Vomiting, unspecified: Secondary | ICD-10-CM | POA: Diagnosis present

## 2020-04-12 DIAGNOSIS — K859 Acute pancreatitis without necrosis or infection, unspecified: Secondary | ICD-10-CM | POA: Insufficient documentation

## 2020-04-12 NOTE — ED Triage Notes (Signed)
Pt states that her pancreatitis is flaring up She also complains of vomiting and diarrhea since yesterday

## 2020-04-13 ENCOUNTER — Encounter (HOSPITAL_COMMUNITY): Payer: Self-pay | Admitting: *Deleted

## 2020-04-13 ENCOUNTER — Emergency Department (HOSPITAL_COMMUNITY)
Admission: EM | Admit: 2020-04-13 | Discharge: 2020-04-13 | Disposition: A | Discharge status: Home / Self Care | Payer: Medicare Other | Source: Home / Self Care

## 2020-04-13 ENCOUNTER — Other Ambulatory Visit: Payer: Self-pay

## 2020-04-13 DIAGNOSIS — K859 Acute pancreatitis without necrosis or infection, unspecified: Secondary | ICD-10-CM | POA: Diagnosis not present

## 2020-04-13 DIAGNOSIS — R197 Diarrhea, unspecified: Secondary | ICD-10-CM | POA: Insufficient documentation

## 2020-04-13 DIAGNOSIS — R109 Unspecified abdominal pain: Secondary | ICD-10-CM | POA: Insufficient documentation

## 2020-04-13 DIAGNOSIS — Z5321 Procedure and treatment not carried out due to patient leaving prior to being seen by health care provider: Secondary | ICD-10-CM | POA: Insufficient documentation

## 2020-04-13 DIAGNOSIS — R112 Nausea with vomiting, unspecified: Secondary | ICD-10-CM | POA: Insufficient documentation

## 2020-04-13 LAB — COMPREHENSIVE METABOLIC PANEL
ALT: 17 U/L (ref 0–44)
ALT: 18 U/L (ref 0–44)
AST: 15 U/L (ref 15–41)
AST: 17 U/L (ref 15–41)
Albumin: 4.3 g/dL (ref 3.5–5.0)
Albumin: 4.6 g/dL (ref 3.5–5.0)
Alkaline Phosphatase: 106 U/L (ref 38–126)
Alkaline Phosphatase: 98 U/L (ref 38–126)
Anion gap: 12 (ref 5–15)
Anion gap: 12 (ref 5–15)
BUN: 10 mg/dL (ref 6–20)
BUN: 11 mg/dL (ref 6–20)
CO2: 25 mmol/L (ref 22–32)
CO2: 25 mmol/L (ref 22–32)
Calcium: 9.3 mg/dL (ref 8.9–10.3)
Calcium: 9.3 mg/dL (ref 8.9–10.3)
Chloride: 100 mmol/L (ref 98–111)
Chloride: 99 mmol/L (ref 98–111)
Creatinine, Ser: 0.59 mg/dL (ref 0.44–1.00)
Creatinine, Ser: 0.74 mg/dL (ref 0.44–1.00)
GFR calc Af Amer: >60 mL/min (ref >60)
GFR calc Af Amer: >60 mL/min (ref >60)
GFR calc non Af Amer: >60 mL/min (ref >60)
GFR calc non Af Amer: >60 mL/min (ref >60)
Glucose, Bld: 160 mg/dL — ABNORMAL HIGH (ref 70–99)
Glucose, Bld: 201 mg/dL — ABNORMAL HIGH (ref 70–99)
Potassium: 4.3 mmol/L (ref 3.5–5.1)
Potassium: 4.4 mmol/L (ref 3.5–5.1)
Sodium: 136 mmol/L (ref 135–145)
Sodium: 137 mmol/L (ref 135–145)
Total Bilirubin: 0.6 mg/dL (ref 0.3–1.2)
Total Bilirubin: 1.2 mg/dL (ref 0.3–1.2)
Total Protein: 7.4 g/dL (ref 6.5–8.1)
Total Protein: 8.3 g/dL — ABNORMAL HIGH (ref 6.5–8.1)

## 2020-04-13 LAB — CBG MONITORING, ED
Glucose-Capillary: 164 mg/dL — ABNORMAL HIGH (ref 70–99)
Glucose-Capillary: 141 mg/dL — ABNORMAL HIGH (ref 70–99)

## 2020-04-13 LAB — CBC
HCT: 45.9 % (ref 36.0–46.0)
HCT: 48.3 % — ABNORMAL HIGH (ref 36.0–46.0)
Hemoglobin: 15 g/dL (ref 12.0–15.0)
Hemoglobin: 16 g/dL — ABNORMAL HIGH (ref 12.0–15.0)
MCH: 27.4 pg (ref 26.0–34.0)
MCH: 28 pg (ref 26.0–34.0)
MCHC: 32.7 g/dL (ref 30.0–36.0)
MCHC: 33.1 g/dL (ref 30.0–36.0)
MCV: 83.8 fL (ref 80.0–100.0)
MCV: 84.6 fL (ref 80.0–100.0)
Platelets: 354 10*3/uL (ref 150–400)
Platelets: 394 10*3/uL (ref 150–400)
RBC: 5.48 MIL/uL — ABNORMAL HIGH (ref 3.87–5.11)
RBC: 5.71 MIL/uL — ABNORMAL HIGH (ref 3.87–5.11)
RDW: 13.6 % (ref 11.5–15.5)
RDW: 13.8 % (ref 11.5–15.5)
WBC: 10.1 10*3/uL (ref 4.0–10.5)
WBC: 11.6 10*3/uL — ABNORMAL HIGH (ref 4.0–10.5)
nRBC: 0 % (ref 0.0–0.2)
nRBC: 0 % (ref 0.0–0.2)

## 2020-04-13 LAB — I-STAT BETA HCG BLOOD, ED (MC, WL, AP ONLY): I-stat hCG, quantitative: <5 m[IU]/mL (ref <5)

## 2020-04-13 LAB — LIPASE, BLOOD
Lipase: 54 U/L — ABNORMAL HIGH (ref 11–51)
Lipase: 72 U/L — ABNORMAL HIGH (ref 11–51)

## 2020-04-13 NOTE — ED Notes (Signed)
Called x3 with no response 

## 2020-04-13 NOTE — ED Triage Notes (Signed)
Pt arrived by [facility] for abd pain. Hx of pancreatitis. Pt was at [facility] last night but lwbs. Having n/v/d. No acute distress is noted at triage.

## 2020-04-13 NOTE — ED Notes (Signed)
Called patient x1 for vitals recheck with no response

## 2020-04-23 ENCOUNTER — Emergency Department (HOSPITAL_COMMUNITY)
Admission: EM | Admit: 2020-04-23 | Discharge: 2020-04-24 | Disposition: A | Discharge status: Home / Self Care | Payer: Medicare Other | Attending: Emergency Medicine | Admitting: Emergency Medicine

## 2020-04-23 ENCOUNTER — Encounter (HOSPITAL_COMMUNITY): Payer: Self-pay

## 2020-04-23 ENCOUNTER — Other Ambulatory Visit: Payer: Self-pay

## 2020-04-23 DIAGNOSIS — R109 Unspecified abdominal pain: Secondary | ICD-10-CM | POA: Diagnosis present

## 2020-04-23 DIAGNOSIS — Z794 Long term (current) use of insulin: Secondary | ICD-10-CM | POA: Insufficient documentation

## 2020-04-23 DIAGNOSIS — F17210 Nicotine dependence, cigarettes, uncomplicated: Secondary | ICD-10-CM | POA: Diagnosis not present

## 2020-04-23 DIAGNOSIS — R630 Anorexia: Secondary | ICD-10-CM | POA: Diagnosis not present

## 2020-04-23 DIAGNOSIS — R112 Nausea with vomiting, unspecified: Secondary | ICD-10-CM | POA: Insufficient documentation

## 2020-04-23 DIAGNOSIS — E119 Type 2 diabetes mellitus without complications: Secondary | ICD-10-CM | POA: Diagnosis not present

## 2020-04-23 DIAGNOSIS — K279 Peptic ulcer, site unspecified, unspecified as acute or chronic, without hemorrhage or perforation: Secondary | ICD-10-CM | POA: Diagnosis not present

## 2020-04-23 DIAGNOSIS — Z7951 Long term (current) use of inhaled steroids: Secondary | ICD-10-CM | POA: Diagnosis not present

## 2020-04-23 DIAGNOSIS — R197 Diarrhea, unspecified: Secondary | ICD-10-CM | POA: Insufficient documentation

## 2020-04-23 DIAGNOSIS — R1013 Epigastric pain: Secondary | ICD-10-CM | POA: Insufficient documentation

## 2020-04-23 DIAGNOSIS — I10 Essential (primary) hypertension: Secondary | ICD-10-CM | POA: Insufficient documentation

## 2020-04-23 LAB — I-STAT BETA HCG BLOOD, ED (MC, WL, AP ONLY): I-stat hCG, quantitative: <5 m[IU]/mL

## 2020-04-23 LAB — COMPREHENSIVE METABOLIC PANEL WITH GFR
ALT: 18 U/L (ref 0–44)
AST: 17 U/L (ref 15–41)
Albumin: 4.5 g/dL (ref 3.5–5.0)
Alkaline Phosphatase: 108 U/L (ref 38–126)
Anion gap: 11 (ref 5–15)
BUN: 10 mg/dL (ref 6–20)
CO2: 27 mmol/L (ref 22–32)
Calcium: 9.5 mg/dL (ref 8.9–10.3)
Chloride: 101 mmol/L (ref 98–111)
Creatinine, Ser: 0.64 mg/dL (ref 0.44–1.00)
GFR calc Af Amer: >60 mL/min
GFR calc non Af Amer: >60 mL/min
Glucose, Bld: 134 mg/dL — ABNORMAL HIGH (ref 70–99)
Potassium: 3.9 mmol/L (ref 3.5–5.1)
Sodium: 139 mmol/L (ref 135–145)
Total Bilirubin: 0.4 mg/dL (ref 0.3–1.2)
Total Protein: 7.9 g/dL (ref 6.5–8.1)

## 2020-04-23 LAB — CBC
HCT: 44.9 % (ref 36.0–46.0)
Hemoglobin: 14.9 g/dL (ref 12.0–15.0)
MCH: 28 pg (ref 26.0–34.0)
MCHC: 33.2 g/dL (ref 30.0–36.0)
MCV: 84.2 fL (ref 80.0–100.0)
Platelets: 334 K/uL (ref 150–400)
RBC: 5.33 MIL/uL — ABNORMAL HIGH (ref 3.87–5.11)
RDW: 14 % (ref 11.5–15.5)
WBC: 9.6 K/uL (ref 4.0–10.5)
nRBC: 0 % (ref 0.0–0.2)

## 2020-04-23 LAB — LIPASE, BLOOD: Lipase: 81 U/L — ABNORMAL HIGH (ref 11–51)

## 2020-04-23 MED ORDER — ONDANSETRON 4 MG PO TBDP: 4.0000 mg | ORAL_TABLET | Freq: Once | ORAL | Status: DC | PRN

## 2020-04-23 NOTE — ED Triage Notes (Signed)
Patient arrived with complaints of mid upper abdominal pain, NVD. Reports having a history of pancreatitis.

## 2020-04-24 ENCOUNTER — Emergency Department (HOSPITAL_COMMUNITY): Payer: Medicare Other

## 2020-04-24 ENCOUNTER — Encounter (HOSPITAL_COMMUNITY): Payer: Self-pay

## 2020-04-24 DIAGNOSIS — K279 Peptic ulcer, site unspecified, unspecified as acute or chronic, without hemorrhage or perforation: Secondary | ICD-10-CM | POA: Diagnosis not present

## 2020-04-24 LAB — URINALYSIS, ROUTINE W REFLEX MICROSCOPIC
Bilirubin Urine: NEGATIVE
Glucose, UA: NEGATIVE mg/dL
Hgb urine dipstick: NEGATIVE
Ketones, ur: NEGATIVE mg/dL
Leukocytes,Ua: NEGATIVE
Nitrite: NEGATIVE
Protein, ur: NEGATIVE mg/dL
Specific Gravity, Urine: 1.02 (ref 1.005–1.030)
pH: 5 (ref 5.0–8.0)

## 2020-04-24 LAB — LACTATE DEHYDROGENASE: LDH: 199 U/L — ABNORMAL HIGH (ref 98–192)

## 2020-04-24 MED ORDER — ONDANSETRON HCL 4 MG/2ML IJ SOLN
4.0000 mg | Freq: Once | INTRAMUSCULAR | Status: AC
  Administered 2020-04-24: 4 mg via INTRAVENOUS
  Filled 2020-04-24: qty 2

## 2020-04-24 MED ORDER — OMEPRAZOLE 40 MG PO CPDR: DELAYED_RELEASE_CAPSULE | ORAL | 0 refills | Status: DC

## 2020-04-24 MED ORDER — ONDANSETRON 8 MG PO TBDP: 8.0000 mg | ORAL_TABLET | Freq: Three times a day (TID) | ORAL | 1 refills | Status: DC | PRN

## 2020-04-24 MED ORDER — HYDROMORPHONE HCL 1 MG/ML IJ SOLN
1.0000 mg | Freq: Once | INTRAMUSCULAR | Status: AC
  Administered 2020-04-24: 1 mg via INTRAVENOUS
  Filled 2020-04-24: qty 1

## 2020-04-24 MED ORDER — SUCRALFATE 1 G PO TABS: 1.0000 g | ORAL_TABLET | Freq: Three times a day (TID) | ORAL | 0 refills | Status: DC

## 2020-04-24 MED ORDER — PANTOPRAZOLE SODIUM 40 MG IV SOLR
40.0000 mg | Freq: Once | INTRAVENOUS | Status: AC
  Administered 2020-04-24: 40 mg via INTRAVENOUS
  Filled 2020-04-24: qty 40

## 2020-04-24 MED ORDER — SUCRALFATE 1 GM/10ML PO SUSP
1.0000 g | Freq: Once | ORAL | Status: AC
  Administered 2020-04-24: 1 g via ORAL
  Filled 2020-04-24: qty 10

## 2020-04-24 MED ORDER — LACTATED RINGERS IV BOLUS
1000.0000 mL | Freq: Once | INTRAVENOUS | Status: AC
  Administered 2020-04-24: 1000 mL via INTRAVENOUS

## 2020-04-24 MED ORDER — IOHEXOL 300 MG/ML  SOLN
100.0000 mL | Freq: Once | INTRAMUSCULAR | Status: AC | PRN
  Administered 2020-04-24: 100 mL via INTRAVENOUS

## 2020-04-24 NOTE — ED Notes (Signed)
Pt ambulatory to RR w/out assistance.

## 2020-04-24 NOTE — ED Provider Notes (Signed)
[facility] DEPT Provider Note: Georgena Spurling, MD, FACEP  CSN: [id number] MRN: [id number] ARRIVAL: 04/23/20 at 2032 ROOM: WA09/WA09   CHIEF COMPLAINT  Abdominal Pain   HISTORY OF PRESENT ILLNESS  04/24/20 12:17 AM [name] is a 54 y.o. female with a history of pancreatitis status post cholecystectomy.  She is here with 2 weeks of epigastric pain associated with nausea, vomiting diarrhea.  She has had a decreased appetite and little oral intake during that time.  She rates the pain is a 10 out of 10, aching in nature and like previous pancreatitis.  It is worse with movement or even light palpation.  She she has a dry mouth which she attributes to decreased ability to drink.    Past Medical History:  Diagnosis Date  . Anxiety   . Arthritis    rheumatoid , osteoarthritis  . Bronchitis   . Chronic back pain   . Depression   . Diabetes mellitus   . Fatty liver   . Fibromyalgia   . GERD (gastroesophageal reflux disease)   . Headache   . Hypertension   . Pancreatitis   . Pneumonia   . Rotator cuff tear, right     Past Surgical History:  Procedure Laterality Date  . ABDOMINAL HYSTERECTOMY    . ANTERIOR CERVICAL DECOMP/DISCECTOMY FUSION N/A 07/21/2017   Procedure: ANTERIOR CERVICAL DISCECTOMY AND FUSION C6-7, REMOVAL OF SCREW C6 PLATE, DEPUY ZERO-P IMPLANT, LOCAL BONE GRAFT, ALLOGRAFT BONE GRAFT, VIVIGEN;  Surgeon: Jessy Oto, MD;  Location: [facility];  Service: Orthopedics;  Laterality: N/A;  . BACK SURGERY     laminectomy  . BONE GRAFT HIP ILIAC CREST    . BREAST BIOPSY    . CARPAL TUNNEL RELEASE    . CERVICAL DISCECTOMY  07/21/2017  . CESAREAN SECTION    . CHOLECYSTECTOMY    . COLONOSCOPY    . frontal fusion    . neck fusion     patient reported  . SHOULDER ARTHROSCOPY WITH SUBACROMIAL DECOMPRESSION, ROTATOR CUFF REPAIR AND BICEP TENDON REPAIR Right 10/31/2017   Procedure: RIGHT SHOULDER ARTHROSCOPY, MANIPULATION UNDER ANESTHESIA, BICEPS TENODESIS, AND  MINI OPEN ROTATOR CUFF TEAR REPAIR;  Surgeon: Meredith Pel, MD;  Location: [facility];  Service: Orthopedics;  Laterality: Right;    Family History  Problem Relation Age of Onset  . Hypertension Father   . Renal Disease Father   . Diabetes Mother   . Hypertension Mother   . Edema Mother   . Diabetes Sister   . Diabetes Brother     Social History   Tobacco Use  . Smoking status: Current Every Day Smoker    Packs/day: 0.50    Types: Cigarettes  . Smokeless tobacco: Never Used  Vaping Use  . Vaping Use: Some days  Substance Use Topics  . Alcohol use: No  . Drug use: Yes    Types: Marijuana    Prior to Admission medications   Medication Sig Start Date End Date Taking? Authorizing Provider  albuterol (PROVENTIL HFA;VENTOLIN HFA) 108 (90 BASE) MCG/ACT inhaler Inhale 2 puffs into the lungs every 4 (four) hours as needed for wheezing.   Yes [provider]  albuterol (PROVENTIL) (2.5 MG/3ML) 0.083% nebulizer solution Take 3 mLs (2.5 mg total) by nebulization every 6 (six) hours as needed for wheezing or shortness of breath. 12/12/19  Yes Yu, Amy V, PA-C  ALPRAZolam Duanne Moron) 1 MG tablet Take 1 mg by mouth daily as needed for anxiety. 10/23/19  Yes  [provider]  AZOR 5-40 MG per tablet Take 1 tablet by mouth daily.  02/06/14  Yes [provider]  insulin glargine (LANTUS) 100 unit/mL SOPN Inject 40 Units into the skin at bedtime.    Yes [provider]  metFORMIN (GLUCOPHAGE) 1000 MG tablet Take 1,000 mg by mouth 2 (two) times daily. 07/29/17  Yes [provider]  metoprolol succinate (TOPROL-XL) 50 MG 24 hr tablet Take 50 mg by mouth daily. 07/21/14  Yes [provider]  phentermine (ADIPEX-P) 37.5 MG tablet Take 37.5 mg by mouth daily. 01/19/20  Yes [provider]  simvastatin (ZOCOR) 20 MG tablet Take 20 mg by mouth daily.    Yes [provider]  tiZANidine (ZANAFLEX) 2 MG tablet Take 1 tablet (2 mg total) by  mouth every 8 (eight) hours as needed for muscle spasms. 02/09/20  Yes Yu, Amy V, PA-C  TRULICITY 8.93 YB/0.1BP SOPN Inject 0.75 mg into the skin once a week. 11/06/19  Yes [provider]  VIIBRYD 40 MG TABS Take 40 mg by mouth daily. 06/03/19  Yes [provider]  DEXILANT 60 MG capsule Take 1 capsule (60 mg total) by mouth daily. 01/25/20 04/24/20 Yes Jacqlyn Larsen, PA-C  omeprazole (PRILOSEC) 40 MG capsule Take 1 capsule every morning at least 30 minutes before first dose of Carafate. 04/24/20   Cammi Consalvo, Jenny Reichmann, MD  ondansetron (ZOFRAN ODT) 8 MG disintegrating tablet Take 1 tablet (8 mg total) by mouth every 8 (eight) hours as needed for nausea or vomiting. 04/24/20   Teffany Blaszczyk, Jenny Reichmann, MD  sucralfate (CARAFATE) 1 g tablet Take 1 tablet (1 g total) by mouth 4 (four) times daily -  with meals and at bedtime. 04/24/20   Kedarius Aloisi, Jenny Reichmann, MD  promethazine (PHENERGAN) 25 MG tablet Take 1 tablet (25 mg total) by mouth every 6 (six) hours as needed for nausea or vomiting. 12/02/19 04/24/20  [name], PA-C    Allergies Patient has no known allergies.   REVIEW OF SYSTEMS  Negative except as noted here or in the History of Present Illness.   PHYSICAL EXAMINATION  Initial Vital Signs Blood pressure (!) 159/99, pulse 91, temperature 98.4 F (36.9 C), temperature source Oral, resp. rate 16, height 5\' 5"  (1.651 m), weight 97.5 kg, SpO2 98 %.  Examination General: Well-developed, well-nourished female in no acute distress; appearance consistent with age of record HENT: normocephalic; atraumatic; dry mucous membranes Eyes: pupils equal, round and reactive to light; extraocular muscles intact Neck: supple Heart: regular rate and rhythm Lungs: clear to auscultation bilaterally Abdomen: soft; nondistended; profound epigastric tenderness; bowel sounds present Extremities: No deformity; full range of motion; pulses normal Neurologic: Awake, alert and oriented; motor function intact in all  extremities and symmetric; no facial droop Skin: Warm and dry Psychiatric: Grimacing   RESULTS  Summary of this visit's results, reviewed and interpreted by myself:   EKG Interpretation  Date/Time:    Ventricular Rate:    PR Interval:    QRS Duration:   QT Interval:    QTC Calculation:   R Axis:     Text Interpretation:        Laboratory Studies: Results for orders placed or performed during the hospital encounter of 04/23/20 (from the past 24 hour(s))  Lipase, blood     Status: Abnormal   Collection Time: 04/23/20  9:22 PM  Result Value Ref Range   Lipase 81 (H) 11 - 51 U/L  Comprehensive metabolic panel     Status:  Abnormal   Collection Time: 04/23/20  9:22 PM  Result Value Ref Range   Sodium 139 135 - 145 mmol/L   Potassium 3.9 3.5 - 5.1 mmol/L   Chloride 101 98 - 111 mmol/L   CO2 27 22 - 32 mmol/L   Glucose, Bld 134 (H) 70 - 99 mg/dL   BUN 10 6 - 20 mg/dL   Creatinine, Ser 0.64 0.44 - 1.00 mg/dL   Calcium 9.5 8.9 - 10.3 mg/dL   Total Protein 7.9 6.5 - 8.1 g/dL   Albumin 4.5 3.5 - 5.0 g/dL   AST 17 15 - 41 U/L   ALT 18 0 - 44 U/L   Alkaline Phosphatase 108 38 - 126 U/L   Total Bilirubin 0.4 0.3 - 1.2 mg/dL   GFR calc non Af Amer >60 >60 mL/min   GFR calc Af Amer >60 >60 mL/min   Anion gap 11 5 - 15  CBC     Status: Abnormal   Collection Time: 04/23/20  9:22 PM  Result Value Ref Range   WBC 9.6 4.0 - 10.5 K/uL   RBC 5.33 (H) 3.87 - 5.11 MIL/uL   Hemoglobin 14.9 12.0 - 15.0 g/dL   HCT 44.9 36 - 46 %   MCV 84.2 80.0 - 100.0 fL   MCH 28.0 26.0 - 34.0 pg   MCHC 33.2 30.0 - 36.0 g/dL   RDW 14.0 11.5 - 15.5 %   Platelets 334 150 - 400 K/uL   nRBC 0.0 0.0 - 0.2 %  Lactate dehydrogenase     Status: Abnormal   Collection Time: 04/23/20  9:22 PM  Result Value Ref Range   LDH 199 (H) 98 - 192 U/L  I-Stat beta hCG blood, ED     Status: None   Collection Time: 04/23/20  9:27 PM  Result Value Ref Range   I-stat hCG, quantitative <5.0 <5 mIU/mL   Comment 3           Urinalysis, Routine w reflex microscopic     Status: None   Collection Time: 04/24/20  2:43 AM  Result Value Ref Range   Color, Urine YELLOW YELLOW   APPearance CLEAR CLEAR   Specific Gravity, Urine 1.020 1.005 - 1.030   pH 5.0 5.0 - 8.0   Glucose, UA NEGATIVE NEGATIVE mg/dL   Hgb urine dipstick NEGATIVE NEGATIVE   Bilirubin Urine NEGATIVE NEGATIVE   Ketones, ur NEGATIVE NEGATIVE mg/dL   Protein, ur NEGATIVE NEGATIVE mg/dL   Nitrite NEGATIVE NEGATIVE   Leukocytes,Ua NEGATIVE NEGATIVE   Imaging Studies: CT ABDOMEN PELVIS W CONTRAST  Result Date: 04/24/2020 CLINICAL DATA:  Upper abdominal pain EXAM: CT ABDOMEN AND PELVIS WITH CONTRAST TECHNIQUE: Multidetector CT imaging of the abdomen and pelvis was performed using the standard protocol following bolus administration of intravenous contrast. CONTRAST:  127mL OMNIPAQUE IOHEXOL 300 MG/ML  SOLN COMPARISON:  CT 01/25/2020, 11/21/2019, 06/15/2015 FINDINGS: Lower chest: No acute abnormality. Hepatobiliary: Status post cholecystectomy. No focal hepatic abnormality. Stable prominence of extrahepatic common bile duct. Pancreas: Unremarkable. No pancreatic ductal dilatation or surrounding inflammatory changes. Spleen: Normal in size without focal abnormality. Adrenals/Urinary Tract: Stable left adrenal gland nodules. Right adrenal gland is normal. Kidneys show no hydronephrosis. The bladder is unremarkable. Stomach/Bowel: Possible mild wall thickening of the gastric antrum. Appendix appears normal. No evidence of bowel wall thickening, distention, or inflammatory changes. Vascular/Lymphatic: Mild aortic atherosclerosis. No aneurysm. No suspicious nodes. Reproductive: Status post hysterectomy. No adnexal masses. Other: Negative for free air or free fluid.  Ventral scarring. Small fat containing supraumbilical ventral hernia. Musculoskeletal: Postsurgical changes at L4-L5 and L5-S1. IMPRESSION: 1. No CT evidence for acute intra-abdominal or pelvic  abnormality. 2. Possible mild wall thickening of the gastric antrum, could reflect gastritis/peptic ulcer disease. Aortic Atherosclerosis (ICD10-I70.0). Electronically Signed   By: Donavan Foil M.D.   On: 04/24/2020 03:22    ED COURSE and MDM  Nursing notes, initial and subsequent vitals signs, including pulse oximetry, reviewed and interpreted by myself.  Vitals:   04/24/20 0100 04/24/20 0319 04/24/20 0400 04/24/20 0557  BP: (!) 147/87 129/86 (!) 143/86 128/88  Pulse: 92 92 85 91  Resp: 16 16  15   Temp:  97.9 F (36.6 C)    TempSrc:  Oral    SpO2: 96% 98% 97% 99%  Weight:      Height:       Medications  lactated ringers bolus 1,000 mL (0 mLs Intravenous Stopped 04/24/20 0158)  ondansetron (ZOFRAN) injection 4 mg (4 mg Intravenous Given 04/24/20 0048)  HYDROmorphone (DILAUDID) injection 1 mg (1 mg Intravenous Given 04/24/20 0048)  iohexol (OMNIPAQUE) 300 MG/ML solution 100 mL (100 mLs Intravenous Contrast Given 04/24/20 0256)  HYDROmorphone (DILAUDID) injection 1 mg (1 mg Intravenous Given 04/24/20 0326)  ondansetron (ZOFRAN) injection 4 mg (4 mg Intravenous Given 04/24/20 0326)  pantoprazole (PROTONIX) injection 40 mg (40 mg Intravenous Given 04/24/20 0334)  sucralfate (CARAFATE) 1 GM/10ML suspension 1 g (1 g Oral Given 04/24/20 0336)   5:51 AM Although the patient's lipase is slightly elevated there is no evidence of pancreatitis on her CT scan.  She did get relief with the sucralfate and the CT does have changes suggestive of gastritis and/or peptic ulcer disease.  We will start her on a PPI and Carafate and have her see her gastroenterologist, Dr. Collene Mares.    PROCEDURES  Procedures   ED DIAGNOSES     ICD-10-CM   1. Peptic ulcer disease  K27.9        Chesley Valls, MD 04/24/20 [phone]

## 2020-04-26 ENCOUNTER — Encounter (HOSPITAL_COMMUNITY): Payer: Self-pay

## 2020-04-26 ENCOUNTER — Emergency Department (HOSPITAL_COMMUNITY)
Admission: EM | Admit: 2020-04-26 | Discharge: 2020-04-26 | Disposition: A | Discharge status: Home / Self Care | Payer: Medicare Other | Attending: Emergency Medicine | Admitting: Emergency Medicine

## 2020-04-26 ENCOUNTER — Other Ambulatory Visit: Payer: Self-pay

## 2020-04-26 DIAGNOSIS — Z79899 Other long term (current) drug therapy: Secondary | ICD-10-CM | POA: Diagnosis not present

## 2020-04-26 DIAGNOSIS — R112 Nausea with vomiting, unspecified: Secondary | ICD-10-CM | POA: Diagnosis not present

## 2020-04-26 DIAGNOSIS — F17210 Nicotine dependence, cigarettes, uncomplicated: Secondary | ICD-10-CM | POA: Insufficient documentation

## 2020-04-26 DIAGNOSIS — R197 Diarrhea, unspecified: Secondary | ICD-10-CM | POA: Insufficient documentation

## 2020-04-26 DIAGNOSIS — E119 Type 2 diabetes mellitus without complications: Secondary | ICD-10-CM | POA: Diagnosis not present

## 2020-04-26 DIAGNOSIS — R1013 Epigastric pain: Secondary | ICD-10-CM | POA: Diagnosis not present

## 2020-04-26 DIAGNOSIS — I10 Essential (primary) hypertension: Secondary | ICD-10-CM | POA: Diagnosis not present

## 2020-04-26 DIAGNOSIS — Z794 Long term (current) use of insulin: Secondary | ICD-10-CM | POA: Insufficient documentation

## 2020-04-26 DIAGNOSIS — R109 Unspecified abdominal pain: Secondary | ICD-10-CM | POA: Diagnosis present

## 2020-04-26 DIAGNOSIS — Z90710 Acquired absence of both cervix and uterus: Secondary | ICD-10-CM | POA: Insufficient documentation

## 2020-04-26 DIAGNOSIS — R638 Other symptoms and signs concerning food and fluid intake: Secondary | ICD-10-CM | POA: Diagnosis not present

## 2020-04-26 LAB — COMPREHENSIVE METABOLIC PANEL
ALT: 16 U/L (ref 0–44)
AST: 15 U/L (ref 15–41)
Albumin: 4.5 g/dL (ref 3.5–5.0)
Alkaline Phosphatase: 99 U/L (ref 38–126)
Anion gap: 13 (ref 5–15)
BUN: 12 mg/dL (ref 6–20)
CO2: 25 mmol/L (ref 22–32)
Calcium: 9.5 mg/dL (ref 8.9–10.3)
Chloride: 98 mmol/L (ref 98–111)
Creatinine, Ser: 0.57 mg/dL (ref 0.44–1.00)
GFR calc Af Amer: >60 mL/min (ref >60)
GFR calc non Af Amer: >60 mL/min (ref >60)
Glucose, Bld: 187 mg/dL — ABNORMAL HIGH (ref 70–99)
Potassium: 4 mmol/L (ref 3.5–5.1)
Sodium: 136 mmol/L (ref 135–145)
Total Bilirubin: 0.7 mg/dL (ref 0.3–1.2)
Total Protein: 7.9 g/dL (ref 6.5–8.1)

## 2020-04-26 LAB — CBC
HCT: 46.1 % — ABNORMAL HIGH (ref 36.0–46.0)
Hemoglobin: 15.5 g/dL — ABNORMAL HIGH (ref 12.0–15.0)
MCH: 28 pg (ref 26.0–34.0)
MCHC: 33.6 g/dL (ref 30.0–36.0)
MCV: 83.2 fL (ref 80.0–100.0)
Platelets: 380 10*3/uL (ref 150–400)
RBC: 5.54 MIL/uL — ABNORMAL HIGH (ref 3.87–5.11)
RDW: 13.4 % (ref 11.5–15.5)
WBC: 10.7 10*3/uL — ABNORMAL HIGH (ref 4.0–10.5)
nRBC: 0 % (ref 0.0–0.2)

## 2020-04-26 LAB — LIPASE, BLOOD: Lipase: 36 U/L (ref 11–51)

## 2020-04-26 MED ORDER — SODIUM CHLORIDE 0.9 % IV BOLUS
500.0000 mL | Freq: Once | INTRAVENOUS | Status: AC
  Administered 2020-04-26: 500 mL via INTRAVENOUS

## 2020-04-26 MED ORDER — HALOPERIDOL LACTATE 5 MG/ML IJ SOLN
2.5000 mg | Freq: Once | INTRAMUSCULAR | Status: AC
  Administered 2020-04-26: 2.5 mg via INTRAVENOUS
  Filled 2020-04-26: qty 1

## 2020-04-26 MED ORDER — METOCLOPRAMIDE HCL 10 MG PO TABS: 10.0000 mg | ORAL_TABLET | Freq: Three times a day (TID) | ORAL | 0 refills | Status: DC | PRN

## 2020-04-26 NOTE — ED Provider Notes (Signed)
[facility] DEPT Provider Note   CSN: [id number] Arrival date & time: 04/26/20  1148     History Chief Complaint  Patient presents with  . Abdominal Pain    [name] is a 54 y.o. female.  HPI Patient presents acute on chronic abdominal pain.  Has had for the last around 6 days.  Seen 3 days ago in the ER and had lipase was mildly elevated at that time but negative CT scan.  Has had a history of recurrent pancreatitis and chronic abdominal pain.  There was potentially some gastritis or ulcer on CT scan but no pancreatitis.  Reviewing records multiple recent CT scans have not shown pancreatitis.  States she is continued to vomit.  States the pain is returned.  States she was feeling better while she was in the ER but had returned.  Sees Dr. Collene Mares for her gastroenterology.    Past Medical History:  Diagnosis Date  . Anxiety   . Arthritis    rheumatoid , osteoarthritis  . Bronchitis   . Chronic back pain   . Depression   . Diabetes mellitus   . Fatty liver   . Fibromyalgia   . GERD (gastroesophageal reflux disease)   . Headache   . Hypertension   . Pancreatitis   . Pneumonia   . Rotator cuff tear, right     Patient Active Problem List   Diagnosis Date Noted  . Abdominal pain 11/23/2019  . Acute pancreatitis 11/21/2019  . Complete tear of right rotator cuff 07/22/2017    Class: Chronic  . Retained orthopedic hardware 07/22/2017    Class: Chronic  . Cervical disc herniation 07/21/2017    Class: Acute  . Status post cervical spinal fusion 07/21/2017  . Chronic back pain     Past Surgical History:  Procedure Laterality Date  . ABDOMINAL HYSTERECTOMY    . ANTERIOR CERVICAL DECOMP/DISCECTOMY FUSION N/A 07/21/2017   Procedure: ANTERIOR CERVICAL DISCECTOMY AND FUSION C6-7, REMOVAL OF SCREW C6 PLATE, DEPUY ZERO-P IMPLANT, LOCAL BONE GRAFT, ALLOGRAFT BONE GRAFT, VIVIGEN;  Surgeon: Jessy Oto, MD;  Location: [facility];  Service:  Orthopedics;  Laterality: N/A;  . BACK SURGERY     laminectomy  . BONE GRAFT HIP ILIAC CREST    . BREAST BIOPSY    . CARPAL TUNNEL RELEASE    . CERVICAL DISCECTOMY  07/21/2017  . CESAREAN SECTION    . CHOLECYSTECTOMY    . COLONOSCOPY    . frontal fusion    . neck fusion     patient reported  . SHOULDER ARTHROSCOPY WITH SUBACROMIAL DECOMPRESSION, ROTATOR CUFF REPAIR AND BICEP TENDON REPAIR Right 10/31/2017   Procedure: RIGHT SHOULDER ARTHROSCOPY, MANIPULATION UNDER ANESTHESIA, BICEPS TENODESIS, AND MINI OPEN ROTATOR CUFF TEAR REPAIR;  Surgeon: Meredith Pel, MD;  Location: [facility];  Service: Orthopedics;  Laterality: Right;     OB History   No obstetric history on file.     Family History  Problem Relation Age of Onset  . Hypertension Father   . Renal Disease Father   . Diabetes Mother   . Hypertension Mother   . Edema Mother   . Diabetes Sister   . Diabetes Brother     Social History   Tobacco Use  . Smoking status: Current Every Day Smoker    Packs/day: 0.50    Types: Cigarettes  . Smokeless tobacco: Never Used  Vaping Use  . Vaping Use: Some days  Substance Use Topics  .  Alcohol use: No  . Drug use: Yes    Types: Marijuana    Home Medications Prior to Admission medications   Medication Sig Start Date End Date Taking? Authorizing Provider  albuterol (PROVENTIL HFA;VENTOLIN HFA) 108 (90 BASE) MCG/ACT inhaler Inhale 2 puffs into the lungs every 4 (four) hours as needed for wheezing.    [provider]  albuterol (PROVENTIL) (2.5 MG/3ML) 0.083% nebulizer solution Take 3 mLs (2.5 mg total) by nebulization every 6 (six) hours as needed for wheezing or shortness of breath. 12/12/19   Tasia Catchings, Amy V, PA-C  ALPRAZolam Duanne Moron) 1 MG tablet Take 1 mg by mouth daily as needed for anxiety. 10/23/19   [provider]  AZOR 5-40 MG per tablet Take 1 tablet by mouth daily.  02/06/14   [provider]  insulin glargine (LANTUS) 100 unit/mL SOPN Inject 40  Units into the skin at bedtime.     [provider]  metFORMIN (GLUCOPHAGE) 1000 MG tablet Take 1,000 mg by mouth 2 (two) times daily. 07/29/17   [provider]  metoCLOPramide (REGLAN) 10 MG tablet Take 1 tablet (10 mg total) by mouth every 8 (eight) hours as needed for nausea. 04/26/20   Davonna Belling, MD  metoprolol succinate (TOPROL-XL) 50 MG 24 hr tablet Take 50 mg by mouth daily. 07/21/14   [provider]  omeprazole (PRILOSEC) 40 MG capsule Take 1 capsule every morning at least 30 minutes before first dose of Carafate. 04/24/20   Molpus, Jenny Reichmann, MD  ondansetron (ZOFRAN ODT) 8 MG disintegrating tablet Take 1 tablet (8 mg total) by mouth every 8 (eight) hours as needed for nausea or vomiting. 04/24/20   Molpus, John, MD  phentermine (ADIPEX-P) 37.5 MG tablet Take 37.5 mg by mouth daily. 01/19/20   [provider]  simvastatin (ZOCOR) 20 MG tablet Take 20 mg by mouth daily.     [provider]  sucralfate (CARAFATE) 1 g tablet Take 1 tablet (1 g total) by mouth 4 (four) times daily -  with meals and at bedtime. 04/24/20   Molpus, John, MD  tiZANidine (ZANAFLEX) 2 MG tablet Take 1 tablet (2 mg total) by mouth every 8 (eight) hours as needed for muscle spasms. 02/09/20   Yu, Amy V, PA-C  TRULICITY 5.03 TW/6.5KC SOPN Inject 0.75 mg into the skin once a week. 11/06/19   [provider]  VIIBRYD 40 MG TABS Take 40 mg by mouth daily. 06/03/19   [provider]  DEXILANT 60 MG capsule Take 1 capsule (60 mg total) by mouth daily. 01/25/20 04/24/20  Jacqlyn Larsen, PA-C  promethazine (PHENERGAN) 25 MG tablet Take 1 tablet (25 mg total) by mouth every 6 (six) hours as needed for nausea or vomiting. 12/02/19 04/24/20  [name], PA-C    Allergies    Patient has no known allergies.  Review of Systems   Review of Systems  Constitutional: Positive for appetite change.  HENT: Negative for congestion.   Respiratory: Negative for shortness of  breath.   Cardiovascular: Negative for chest pain.  Gastrointestinal: Positive for abdominal pain, diarrhea, nausea and vomiting.  Genitourinary: Negative for flank pain.  Musculoskeletal: Negative for arthralgias.  Skin: Negative for rash.  Neurological: Negative for weakness.  Psychiatric/Behavioral: Negative for confusion.    Physical Exam Updated Vital Signs BP (!) 132/100   Pulse 78   Temp 98.9 F (37.2 C) (Oral)   Resp 18   Ht 5\' 5"  (1.651 m)   Wt 97.5 kg  SpO2 97%   BMI 35.77 kg/m   Physical Exam Vitals and nursing note reviewed.  HENT:     Head: Normocephalic.  Eyes:     Pupils: Pupils are equal, round, and reactive to light.  Cardiovascular:     Rate and Rhythm: Regular rhythm.  Pulmonary:     Breath sounds: Normal breath sounds.  Abdominal:     Tenderness: There is abdominal tenderness.     Comments: Upper abdominal tenderness without rebound or guarding.  Skin:    General: Skin is warm.     Capillary Refill: Capillary refill takes less than 2 seconds.  Neurological:     Mental Status: She is alert and oriented to person, place, and time.     ED Results / Procedures / Treatments   Labs (all labs ordered are listed, but only abnormal results are displayed) Labs Reviewed  COMPREHENSIVE METABOLIC PANEL - Abnormal; Notable for the following components:      Result Value   Glucose, Bld 187 (*)    All other components within normal limits  CBC - Abnormal; Notable for the following components:   WBC 10.7 (*)    RBC 5.54 (*)    Hemoglobin 15.5 (*)    HCT 46.1 (*)    All other components within normal limits  LIPASE, BLOOD  URINALYSIS, ROUTINE W REFLEX MICROSCOPIC    EKG None  Radiology No results found.  Procedures Procedures (including critical care time)  Medications Ordered in ED Medications  haloperidol lactate (HALDOL) injection 2.5 mg (2.5 mg Intravenous Given 04/26/20 2156)  sodium chloride 0.9 % bolus 500 mL (500 mLs Intravenous New  Bag/Given 04/26/20 2158)    ED Course  I have reviewed the triage vital signs and the nursing notes.  Pertinent labs & imaging results that were available during my care of the patient were reviewed by me and considered in my medical decision making (see chart for details).    MDM Rules/Calculators/A&P                          Patient with acute on chronic abdominal pain.  Recently seen for same.  Reportedly has been doing worse since.  Has been vomiting.  However lab work is reassuring.  Tolerated some orals here.  Has not been vomiting in the almost 12 hours she has been here.  Lipase was mildly elevated is now normalized.  Given fluid and Haldol.  Will discharge home.  States she has Phenergan and has had Zofran they do not work.  Will give a short trial of Reglan.  Discharge home with outpatient follow-up with her primary gastroenterologist, Dr. Collene Mares. Final Clinical Impression(s) / ED Diagnoses Final diagnoses:  Epigastric pain  Non-intractable vomiting with nausea, unspecified vomiting type    Rx / DC Orders ED Discharge Orders         Ordered    metoCLOPramide (REGLAN) 10 MG tablet  Every 8 hours PRN        04/26/20 2320           Davonna Belling, MD 04/26/20 2328

## 2020-04-26 NOTE — ED Triage Notes (Addendum)
Pt arrived via EMS from home, generalized abd pain, was seen 9/12 for same, dx with peptic ulcer disease, felt better, now generalized abd pain worsening.

## 2020-05-17 ENCOUNTER — Emergency Department (HOSPITAL_COMMUNITY)
Admission: EM | Admit: 2020-05-17 | Discharge: 2020-05-17 | Disposition: A | Discharge status: Home / Self Care | Payer: Medicare Other | Attending: Emergency Medicine | Admitting: Emergency Medicine

## 2020-05-17 ENCOUNTER — Other Ambulatory Visit: Payer: Self-pay

## 2020-05-17 DIAGNOSIS — K279 Peptic ulcer, site unspecified, unspecified as acute or chronic, without hemorrhage or perforation: Secondary | ICD-10-CM

## 2020-05-17 DIAGNOSIS — E119 Type 2 diabetes mellitus without complications: Secondary | ICD-10-CM | POA: Insufficient documentation

## 2020-05-17 DIAGNOSIS — Z794 Long term (current) use of insulin: Secondary | ICD-10-CM | POA: Diagnosis not present

## 2020-05-17 DIAGNOSIS — F17210 Nicotine dependence, cigarettes, uncomplicated: Secondary | ICD-10-CM | POA: Insufficient documentation

## 2020-05-17 DIAGNOSIS — Z79899 Other long term (current) drug therapy: Secondary | ICD-10-CM | POA: Insufficient documentation

## 2020-05-17 DIAGNOSIS — K219 Gastro-esophageal reflux disease without esophagitis: Secondary | ICD-10-CM | POA: Diagnosis not present

## 2020-05-17 DIAGNOSIS — I10 Essential (primary) hypertension: Secondary | ICD-10-CM | POA: Insufficient documentation

## 2020-05-17 DIAGNOSIS — Z7984 Long term (current) use of oral hypoglycemic drugs: Secondary | ICD-10-CM | POA: Insufficient documentation

## 2020-05-17 DIAGNOSIS — R1013 Epigastric pain: Secondary | ICD-10-CM | POA: Diagnosis present

## 2020-05-17 LAB — CBC
HCT: 44.1 % (ref 36.0–46.0)
Hemoglobin: 14.6 g/dL (ref 12.0–15.0)
MCH: 27.9 pg (ref 26.0–34.0)
MCHC: 33.1 g/dL (ref 30.0–36.0)
MCV: 84.3 fL (ref 80.0–100.0)
Platelets: 408 10*3/uL — ABNORMAL HIGH (ref 150–400)
RBC: 5.23 MIL/uL — ABNORMAL HIGH (ref 3.87–5.11)
RDW: 14 % (ref 11.5–15.5)
WBC: 10.6 10*3/uL — ABNORMAL HIGH (ref 4.0–10.5)
nRBC: 0 % (ref 0.0–0.2)

## 2020-05-17 LAB — COMPREHENSIVE METABOLIC PANEL
ALT: 16 U/L (ref 0–44)
AST: 17 U/L (ref 15–41)
Albumin: 4.3 g/dL (ref 3.5–5.0)
Alkaline Phosphatase: 104 U/L (ref 38–126)
Anion gap: 12 (ref 5–15)
BUN: 12 mg/dL (ref 6–20)
CO2: 22 mmol/L (ref 22–32)
Calcium: 9.5 mg/dL (ref 8.9–10.3)
Chloride: 103 mmol/L (ref 98–111)
Creatinine, Ser: 0.68 mg/dL (ref 0.44–1.00)
GFR calc non Af Amer: >60 mL/min (ref >60)
Glucose, Bld: 250 mg/dL — ABNORMAL HIGH (ref 70–99)
Potassium: 4.2 mmol/L (ref 3.5–5.1)
Sodium: 137 mmol/L (ref 135–145)
Total Bilirubin: 0.7 mg/dL (ref 0.3–1.2)
Total Protein: 7.6 g/dL (ref 6.5–8.1)

## 2020-05-17 LAB — I-STAT BETA HCG BLOOD, ED (MC, WL, AP ONLY): I-stat hCG, quantitative: <5 m[IU]/mL (ref <5)

## 2020-05-17 LAB — URINALYSIS, ROUTINE W REFLEX MICROSCOPIC
Bilirubin Urine: NEGATIVE
Glucose, UA: 50 mg/dL — AB
Hgb urine dipstick: NEGATIVE
Ketones, ur: NEGATIVE mg/dL
Leukocytes,Ua: NEGATIVE
Nitrite: NEGATIVE
Protein, ur: NEGATIVE mg/dL
Specific Gravity, Urine: 1.018 (ref 1.005–1.030)
pH: 5 (ref 5.0–8.0)

## 2020-05-17 LAB — LIPASE, BLOOD: Lipase: 53 U/L — ABNORMAL HIGH (ref 11–51)

## 2020-05-17 MED ORDER — LIDOCAINE VISCOUS HCL 2 % MT SOLN
15.0000 mL | Freq: Once | OROMUCOSAL | Status: AC
  Administered 2020-05-17: 15 mL via ORAL
  Filled 2020-05-17: qty 15

## 2020-05-17 MED ORDER — METOCLOPRAMIDE HCL 10 MG PO TABS: 10.0000 mg | ORAL_TABLET | Freq: Four times a day (QID) | ORAL | 0 refills | Status: DC

## 2020-05-17 MED ORDER — SUCRALFATE 1 G PO TABS
1.0000 g | ORAL_TABLET | Freq: Once | ORAL | Status: AC
  Administered 2020-05-17: 1 g via ORAL
  Filled 2020-05-17: qty 1

## 2020-05-17 MED ORDER — HALOPERIDOL LACTATE 5 MG/ML IJ SOLN
1.0000 mg | Freq: Once | INTRAMUSCULAR | Status: AC
  Administered 2020-05-17: 1 mg via INTRAVENOUS
  Filled 2020-05-17: qty 1

## 2020-05-17 MED ORDER — ALUM & MAG HYDROXIDE-SIMETH 200-200-20 MG/5ML PO SUSP
30.0000 mL | Freq: Once | ORAL | Status: AC
  Administered 2020-05-17: 30 mL via ORAL
  Filled 2020-05-17: qty 30

## 2020-05-17 MED ORDER — DICYCLOMINE HCL 10 MG PO CAPS
10.0000 mg | ORAL_CAPSULE | Freq: Once | ORAL | Status: AC
  Administered 2020-05-17: 10 mg via ORAL
  Filled 2020-05-17: qty 1

## 2020-05-17 MED ORDER — METOCLOPRAMIDE HCL 5 MG/ML IJ SOLN
10.0000 mg | Freq: Once | INTRAMUSCULAR | Status: AC
  Administered 2020-05-17: 10 mg via INTRAVENOUS
  Filled 2020-05-17: qty 2

## 2020-05-17 MED ORDER — SODIUM CHLORIDE 0.9 % IV BOLUS
1000.0000 mL | Freq: Once | INTRAVENOUS | Status: AC
  Administered 2020-05-17: 1000 mL via INTRAVENOUS

## 2020-05-17 NOTE — ED Notes (Signed)
Pt able to tolerate some water. Pain 6/10.

## 2020-05-17 NOTE — ED Provider Notes (Signed)
[facility] EMERGENCY DEPARTMENT Provider Note   CSN: [id number] Arrival date & time: 05/17/20  1962     History Chief Complaint  Patient presents with  . Abdominal Pain    [name] is a 54 y.o. female.  HPI 54 year old female with a chief anxiety, chronic back pain, depression, DM type II, fibromyalgia, GERD, chronic pancreatitis, s/p cholecystectomy presents to the ER with complaints of epigastric pain which began on Sunday.  Patient has history of bouts of epigastric pain, with this one starting on Sunday.  Chart review, patient has frequent visits to the ED with similar complaints, most recently seen in the ER 9/13 and 9/15 with similar complaints.  Patient reports abdominal pain, nausea, nonbloody nonbilious vomiting, watery diarrhea with no blood, poor p.o. intake and poor appetite. Abdominal pain is worse with movement and light palpation .  Patient states that she has been taking Phenergan for nausea which has not been working.  Per chart review she is following with Dr. Collene Mares, but is scheduled to see a specialist with [facility] on 10/18.  Pain is largely.,  Does not travel anywhere.  She reports that she has been told that she has pancreatitis and peptic ulcer disease.  Patient has a CT scan as recently as 9/13 which did not show any pancreatitis, though her lipase was elevated on both of her visits.  Nuys any chest pain, shortness of breath, fevers, chills, dysuria    Past Medical History:  Diagnosis Date  . Anxiety   . Arthritis    rheumatoid , osteoarthritis  . Bronchitis   . Chronic back pain   . Depression   . Diabetes mellitus   . Fatty liver   . Fibromyalgia   . GERD (gastroesophageal reflux disease)   . Headache   . Hypertension   . Pancreatitis   . Pneumonia   . Rotator cuff tear, right     Patient Active Problem List   Diagnosis Date Noted  . Abdominal pain 11/23/2019  . Acute pancreatitis 11/21/2019  . Complete tear of right  rotator cuff 07/22/2017    Class: Chronic  . Retained orthopedic hardware 07/22/2017    Class: Chronic  . Cervical disc herniation 07/21/2017    Class: Acute  . Status post cervical spinal fusion 07/21/2017  . Chronic back pain     Past Surgical History:  Procedure Laterality Date  . ABDOMINAL HYSTERECTOMY    . ANTERIOR CERVICAL DECOMP/DISCECTOMY FUSION N/A 07/21/2017   Procedure: ANTERIOR CERVICAL DISCECTOMY AND FUSION C6-7, REMOVAL OF SCREW C6 PLATE, DEPUY ZERO-P IMPLANT, LOCAL BONE GRAFT, ALLOGRAFT BONE GRAFT, VIVIGEN;  Surgeon: Jessy Oto, MD;  Location: [facility];  Service: Orthopedics;  Laterality: N/A;  . BACK SURGERY     laminectomy  . BONE GRAFT HIP ILIAC CREST    . BREAST BIOPSY    . CARPAL TUNNEL RELEASE    . CERVICAL DISCECTOMY  07/21/2017  . CESAREAN SECTION    . CHOLECYSTECTOMY    . COLONOSCOPY    . frontal fusion    . neck fusion     patient reported  . SHOULDER ARTHROSCOPY WITH SUBACROMIAL DECOMPRESSION, ROTATOR CUFF REPAIR AND BICEP TENDON REPAIR Right 10/31/2017   Procedure: RIGHT SHOULDER ARTHROSCOPY, MANIPULATION UNDER ANESTHESIA, BICEPS TENODESIS, AND MINI OPEN ROTATOR CUFF TEAR REPAIR;  Surgeon: Meredith Pel, MD;  Location: [facility];  Service: Orthopedics;  Laterality: Right;     OB History   No obstetric history on file.  Family History  Problem Relation Age of Onset  . Hypertension Father   . Renal Disease Father   . Diabetes Mother   . Hypertension Mother   . Edema Mother   . Diabetes Sister   . Diabetes Brother     Social History   Tobacco Use  . Smoking status: Current Every Day Smoker    Packs/day: 0.50    Types: Cigarettes  . Smokeless tobacco: Never Used  Vaping Use  . Vaping Use: Some days  Substance Use Topics  . Alcohol use: No  . Drug use: Yes    Types: Marijuana    Home Medications Prior to Admission medications   Medication Sig Start Date End Date Taking? Authorizing Provider  albuterol (PROVENTIL HFA;VENTOLIN  HFA) 108 (90 BASE) MCG/ACT inhaler Inhale 2 puffs into the lungs every 4 (four) hours as needed for wheezing.   Yes [provider]  albuterol (PROVENTIL) (2.5 MG/3ML) 0.083% nebulizer solution Take 3 mLs (2.5 mg total) by nebulization every 6 (six) hours as needed for wheezing or shortness of breath. 12/12/19  Yes Yu, Amy V, PA-C  ALPRAZolam Duanne Moron) 1 MG tablet Take 1 mg by mouth daily as needed for anxiety. 10/23/19  Yes [provider]  AZOR 5-40 MG per tablet Take 1 tablet by mouth daily.  02/06/14  Yes [provider]  cetirizine (ZYRTEC) 10 MG tablet Take 10 mg by mouth as needed for allergies.  04/18/20  Yes [provider]  cyclobenzaprine (FLEXERIL) 10 MG tablet Take 10 mg by mouth 2 (two) times daily as needed. 02/10/20  Yes [provider]  dicyclomine (BENTYL) 10 MG capsule Take 10 mg by mouth 3 (three) times daily as needed for spasms.  05/05/20  Yes [provider]  insulin glargine (LANTUS) 100 unit/mL SOPN Inject 40 Units into the skin at bedtime.    Yes [provider]  JANUVIA 100 MG tablet Take 100 mg by mouth daily. 03/28/20  Yes [provider]  metFORMIN (GLUCOPHAGE) 1000 MG tablet Take 1,000 mg by mouth 2 (two) times daily. 07/29/17  Yes [provider]  metoprolol succinate (TOPROL-XL) 50 MG 24 hr tablet Take 50 mg by mouth daily. 07/21/14  Yes [provider]  omeprazole (PRILOSEC) 40 MG capsule Take 1 capsule every morning at least 30 minutes before first dose of Carafate. 04/24/20  Yes Molpus, Jenny Reichmann, MD  ondansetron (ZOFRAN ODT) 8 MG disintegrating tablet Take 1 tablet (8 mg total) by mouth every 8 (eight) hours as needed for nausea or vomiting. 04/24/20  Yes Molpus, John, MD  phentermine (ADIPEX-P) 37.5 MG tablet Take 37.5 mg by mouth daily. 01/19/20  Yes [provider]  simvastatin (ZOCOR) 20 MG tablet Take 20 mg by mouth daily.    Yes [provider]  sucralfate (CARAFATE) 1 g  tablet Take 1 tablet (1 g total) by mouth 4 (four) times daily -  with meals and at bedtime. 04/24/20  Yes Molpus, John, MD  TRULICITY 0.27 XA/1.2IN SOPN Inject 0.75 mg into the skin once a week. Mondays 11/06/19  Yes [provider]  VIIBRYD 40 MG TABS Take 40 mg by mouth daily. 06/03/19  Yes [provider]  metoCLOPramide (REGLAN) 10 MG tablet Take 1 tablet (10 mg total) by mouth every 6 (six) hours. 05/17/20   Garald Balding, PA-C  tiZANidine (ZANAFLEX) 2 MG tablet Take 1 tablet (2 mg total) by mouth every 8 (eight) hours as needed for muscle spasms. Patient not taking:  Reported on 05/17/2020 02/09/20   Ok Edwards, PA-C  DEXILANT 60 MG capsule Take 1 capsule (60 mg total) by mouth daily. 01/25/20 04/24/20  Jacqlyn Larsen, PA-C  promethazine (PHENERGAN) 25 MG tablet Take 1 tablet (25 mg total) by mouth every 6 (six) hours as needed for nausea or vomiting. 12/02/19 04/24/20  [name], PA-C    Allergies    Patient has no known allergies.  Review of Systems   Review of Systems  Constitutional: Negative for chills and fever.  HENT: Negative for ear pain and sore throat.   Eyes: Negative for pain and visual disturbance.  Respiratory: Negative for cough and shortness of breath.   Cardiovascular: Negative for chest pain and palpitations.  Gastrointestinal: Positive for abdominal pain, diarrhea, nausea and vomiting. Negative for blood in stool.  Genitourinary: Negative for dysuria and hematuria.  Musculoskeletal: Negative for arthralgias and back pain.  Skin: Negative for color change and rash.  Neurological: Negative for seizures and syncope.  All other systems reviewed and are negative.   Physical Exam Updated Vital Signs BP (!) 150/85   Pulse (!) 106   Temp 98.9 F (37.2 C) (Oral)   Resp (!) 21   SpO2 95%   Physical Exam Vitals and nursing note reviewed.  Constitutional:      General: She is not in acute distress.    Appearance: She is well-developed. She is not  ill-appearing, toxic-appearing or diaphoretic.  HENT:     Head: Normocephalic and atraumatic.     Comments: Mildly dry mucous membranes Eyes:     Conjunctiva/sclera: Conjunctivae normal.  Cardiovascular:     Rate and Rhythm: Normal rate and regular rhythm.     Heart sounds: Normal heart sounds. No murmur heard.   Pulmonary:     Effort: Pulmonary effort is normal. No respiratory distress.     Breath sounds: Normal breath sounds.  Abdominal:     General: Abdomen is flat. Bowel sounds are normal.     Palpations: Abdomen is soft.     Tenderness: There is abdominal tenderness in the right upper quadrant, epigastric area and left upper quadrant. Negative signs include Murphy's sign and McBurney's sign.     Hernia: No hernia is present.  Musculoskeletal:     Cervical back: Neck supple.  Skin:    General: Skin is warm and dry.  Neurological:     Mental Status: She is alert.     ED Results / Procedures / Treatments   Labs (all labs ordered are listed, but only abnormal results are displayed) Labs Reviewed  LIPASE, BLOOD - Abnormal; Notable for the following components:      Result Value   Lipase 53 (*)    All other components within normal limits  COMPREHENSIVE METABOLIC PANEL - Abnormal; Notable for the following components:   Glucose, Bld 250 (*)    All other components within normal limits  CBC - Abnormal; Notable for the following components:   WBC 10.6 (*)    RBC 5.23 (*)    Platelets 408 (*)    All other components within normal limits  URINALYSIS, ROUTINE W REFLEX MICROSCOPIC - Abnormal; Notable for the following components:   APPearance HAZY (*)    Glucose, UA 50 (*)    All other components within normal limits  I-STAT BETA HCG BLOOD, ED ([facility], [facility], [facility] ONLY)    EKG None  Radiology No results found.  Procedures Procedures (including critical care time)  Medications Ordered in ED  Medications  haloperidol lactate (HALDOL) injection 1 mg (1 mg Intravenous Given  05/17/20 0900)  sodium chloride 0.9 % bolus 1,000 mL (0 mLs Intravenous Stopped 05/17/20 1042)  dicyclomine (BENTYL) capsule 10 mg (10 mg Oral Given 05/17/20 0847)  sucralfate (CARAFATE) tablet 1 g (1 g Oral Given 05/17/20 0847)  alum & mag hydroxide-simeth (MAALOX/MYLANTA) 200-200-20 MG/5ML suspension 30 mL (30 mLs Oral Given 05/17/20 0848)    And  lidocaine (XYLOCAINE) 2 % viscous mouth solution 15 mL (15 mLs Oral Given 05/17/20 0848)  metoCLOPramide (REGLAN) injection 10 mg (10 mg Intravenous Given 05/17/20 1043)    ED Course  I have reviewed the triage vital signs and the nursing notes.  Pertinent labs & imaging results that were available during my care of the patient were reviewed by me and considered in my medical decision making (see chart for details).    MDM Rules/Calculators/A&P                         54 year old female with a history of acute on chronic abdominal pain On presentation, she is alert, oriented, nontoxic-appearing, though she does look uncomfortable.  She is slightly tachycardic on arrival, however other vitals are reassuring.  I attribute the tachycardia secondary to pain.  Physical exam with pain with light palpation to the epigastric, right upper quadrant and left upper quadrant, no rebound or guarding.  Seems to be consistent with her prior exams per chart review.  Her CBC shows a mild leukocytosis of 10.6, which she has had in prior presentations.  Her CMP is without any significant abnormalities, glucose of 250.  UA without evidence of UTI.  Her lipase is 53 which appears to be stable.  Currently, low suspicion for perforated ulcer, cystitis (patient postcholecystectomy), prior CT scans without evidence of lipase and quantity has not changed.  Will attempt to control nausea with Haldol, will also give Bentyl, Carafate and GI cocktail with some fluids.  Patient received fluids and Haldol, reports improvement in abdominal pain.  She was still feeling little bit nauseous,  gave additional Reglan.  She tolerated p.o. fluids well.  Low concern for acute abdomen given the patient's symptoms are consistent with prior presentations and her lab work has remained stable.  Pending follow-up with [facility] on the 18th of this month.  She is already on Carafate and omeprazole.  I encouraged her to continue taking this.  Return precautions discussed.  At this stage in ED course, the patient medically screened and stable for discharge.  Final Clinical Impression(s) / ED Diagnoses Final diagnoses:  Peptic ulcer disease    Rx / DC Orders ED Discharge Orders         Ordered    metoCLOPramide (REGLAN) 10 MG tablet  Every 6 hours        05/17/20 1245           Garald Balding, PA-C 05/17/20 1247    Little, Wenda Overland, MD 05/17/20 1614

## 2020-05-17 NOTE — ED Notes (Signed)
Pt ambulated to and from the restroom without assistance

## 2020-05-17 NOTE — ED Notes (Signed)
Pt given ice water for PO challenge.

## 2020-05-17 NOTE — ED Notes (Signed)
Pt given discharge instructions. All questions answered during discharge.

## 2020-05-17 NOTE — ED Triage Notes (Signed)
Pt presents to ED POV. Pt c/o upper mid abd pain. Pt reports that she has had n/v/d since Sunday. Pt reports seen multiple times for same.

## 2020-05-17 NOTE — Discharge Instructions (Addendum)
Please make sure to take your omeprazole and Carafate.  I have prescribed a new nausea medication called Reglan.  Please try this at home.  Return to the ER for any new or worsening symptoms.  Please make sure to keep your appointment with [facility].

## 2020-05-29 DIAGNOSIS — I10 Essential (primary) hypertension: Secondary | ICD-10-CM | POA: Insufficient documentation

## 2020-06-08 ENCOUNTER — Other Ambulatory Visit: Payer: Self-pay | Admitting: Internal Medicine

## 2020-06-08 DIAGNOSIS — Z1231 Encounter for screening mammogram for malignant neoplasm of breast: Secondary | ICD-10-CM

## 2020-06-17 ENCOUNTER — Emergency Department (HOSPITAL_COMMUNITY)
Admission: EM | Admit: 2020-06-17 | Discharge: 2020-06-17 | Disposition: A | Discharge status: Home / Self Care | Payer: Medicare Other | Attending: Emergency Medicine | Admitting: Emergency Medicine

## 2020-06-17 ENCOUNTER — Emergency Department (HOSPITAL_COMMUNITY): Payer: Medicare Other

## 2020-06-17 ENCOUNTER — Encounter (HOSPITAL_COMMUNITY): Payer: Self-pay

## 2020-06-17 ENCOUNTER — Other Ambulatory Visit: Payer: Self-pay

## 2020-06-17 DIAGNOSIS — R112 Nausea with vomiting, unspecified: Secondary | ICD-10-CM | POA: Insufficient documentation

## 2020-06-17 DIAGNOSIS — Z79899 Other long term (current) drug therapy: Secondary | ICD-10-CM | POA: Diagnosis not present

## 2020-06-17 DIAGNOSIS — I10 Essential (primary) hypertension: Secondary | ICD-10-CM | POA: Insufficient documentation

## 2020-06-17 DIAGNOSIS — R1013 Epigastric pain: Secondary | ICD-10-CM | POA: Diagnosis present

## 2020-06-17 DIAGNOSIS — Z794 Long term (current) use of insulin: Secondary | ICD-10-CM | POA: Diagnosis not present

## 2020-06-17 DIAGNOSIS — Z7984 Long term (current) use of oral hypoglycemic drugs: Secondary | ICD-10-CM | POA: Diagnosis not present

## 2020-06-17 DIAGNOSIS — F17210 Nicotine dependence, cigarettes, uncomplicated: Secondary | ICD-10-CM | POA: Insufficient documentation

## 2020-06-17 DIAGNOSIS — E119 Type 2 diabetes mellitus without complications: Secondary | ICD-10-CM | POA: Diagnosis not present

## 2020-06-17 LAB — CBC WITH DIFFERENTIAL/PLATELET
Abs Immature Granulocytes: 0.04 10*3/uL (ref 0.00–0.07)
Basophils Absolute: 0 10*3/uL (ref 0.0–0.1)
Basophils Relative: 0 %
Eosinophils Absolute: 0 10*3/uL (ref 0.0–0.5)
Eosinophils Relative: 0 %
HCT: 44.9 % (ref 36.0–46.0)
Hemoglobin: 15 g/dL (ref 12.0–15.0)
Immature Granulocytes: 0 %
Lymphocytes Relative: 13 %
Lymphs Abs: 1.5 10*3/uL (ref 0.7–4.0)
MCH: 28.5 pg (ref 26.0–34.0)
MCHC: 33.4 g/dL (ref 30.0–36.0)
MCV: 85.2 fL (ref 80.0–100.0)
Monocytes Absolute: 0.3 10*3/uL (ref 0.1–1.0)
Monocytes Relative: 2 %
Neutro Abs: 9.9 10*3/uL — ABNORMAL HIGH (ref 1.7–7.7)
Neutrophils Relative %: 85 %
Platelets: 343 10*3/uL (ref 150–400)
RBC: 5.27 MIL/uL — ABNORMAL HIGH (ref 3.87–5.11)
RDW: 13.6 % (ref 11.5–15.5)
WBC: 11.7 10*3/uL — ABNORMAL HIGH (ref 4.0–10.5)
nRBC: 0 % (ref 0.0–0.2)

## 2020-06-17 LAB — COMPREHENSIVE METABOLIC PANEL
ALT: 21 U/L (ref 0–44)
AST: 16 U/L (ref 15–41)
Albumin: 4.5 g/dL (ref 3.5–5.0)
Alkaline Phosphatase: 91 U/L (ref 38–126)
Anion gap: 13 (ref 5–15)
BUN: 9 mg/dL (ref 6–20)
CO2: 22 mmol/L (ref 22–32)
Calcium: 9.1 mg/dL (ref 8.9–10.3)
Chloride: 102 mmol/L (ref 98–111)
Creatinine, Ser: 0.56 mg/dL (ref 0.44–1.00)
GFR, Estimated: >60 mL/min (ref >60)
Glucose, Bld: 225 mg/dL — ABNORMAL HIGH (ref 70–99)
Potassium: 3.7 mmol/L (ref 3.5–5.1)
Sodium: 137 mmol/L (ref 135–145)
Total Bilirubin: 0.8 mg/dL (ref 0.3–1.2)
Total Protein: 7.5 g/dL (ref 6.5–8.1)

## 2020-06-17 LAB — URINALYSIS, ROUTINE W REFLEX MICROSCOPIC
Bacteria, UA: NONE SEEN
Bilirubin Urine: NEGATIVE
Glucose, UA: >=500 mg/dL — AB
Ketones, ur: 80 mg/dL — AB
Leukocytes,Ua: NEGATIVE
Nitrite: NEGATIVE
Protein, ur: NEGATIVE mg/dL
Specific Gravity, Urine: 1.017 (ref 1.005–1.030)
pH: 6 (ref 5.0–8.0)

## 2020-06-17 LAB — LIPASE, BLOOD: Lipase: 38 U/L (ref 11–51)

## 2020-06-17 MED ORDER — PROMETHAZINE HCL 25 MG RE SUPP: 25.0000 mg | Freq: Four times a day (QID) | RECTAL | 0 refills | Status: DC | PRN

## 2020-06-17 MED ORDER — MORPHINE SULFATE (PF) 4 MG/ML IV SOLN
4.0000 mg | Freq: Once | INTRAVENOUS | Status: AC
  Administered 2020-06-17: 4 mg via INTRAVENOUS
  Filled 2020-06-17: qty 1

## 2020-06-17 MED ORDER — ONDANSETRON HCL 4 MG PO TABS: 4.0000 mg | ORAL_TABLET | Freq: Four times a day (QID) | ORAL | 0 refills | Status: DC

## 2020-06-17 MED ORDER — LORAZEPAM 2 MG/ML IJ SOLN
0.5000 mg | Freq: Once | INTRAMUSCULAR | Status: AC
  Administered 2020-06-17: 0.5 mg via INTRAVENOUS
  Filled 2020-06-17: qty 1

## 2020-06-17 MED ORDER — ONDANSETRON HCL 4 MG/2ML IJ SOLN
4.0000 mg | Freq: Once | INTRAMUSCULAR | Status: AC
  Administered 2020-06-17: 4 mg via INTRAVENOUS
  Filled 2020-06-17: qty 2

## 2020-06-17 MED ORDER — SODIUM CHLORIDE 0.9 % IV BOLUS
500.0000 mL | Freq: Once | INTRAVENOUS | Status: AC
  Administered 2020-06-17: 500 mL via INTRAVENOUS

## 2020-06-17 MED ORDER — IOHEXOL 300 MG/ML  SOLN
100.0000 mL | Freq: Once | INTRAMUSCULAR | Status: AC | PRN
  Administered 2020-06-17: 100 mL via INTRAVENOUS

## 2020-06-17 MED ORDER — FAMOTIDINE IN NACL 20-0.9 MG/50ML-% IV SOLN
20.0000 mg | Freq: Once | INTRAVENOUS | Status: AC
  Administered 2020-06-17: 20 mg via INTRAVENOUS
  Filled 2020-06-17: qty 50

## 2020-06-17 NOTE — ED Notes (Signed)
Patient attached to [facility]

## 2020-06-17 NOTE — Discharge Instructions (Signed)
Please return for any problem.  °

## 2020-06-17 NOTE — ED Provider Notes (Signed)
[facility] DEPT Provider Note   CSN: [id number] Arrival date & time: 06/17/20  1058     History Chief Complaint  Patient presents with  . Abdominal Pain    [name] is a 54 y.o. female.  54 year old female with prior medical history as detailed below presents for evaluation.  Patient complains of epigastric abdominal pain.  This is associated with significant nausea.  Symptoms began early this morning.  They awoke her from sleep.  She reports multiple prior episodes of similar complaints in the past.  She reports that her work-up previously has not demonstrated clear pathology causing her symptoms.  Patient denies fever.  She denies vomiting of blood.  She denies bowel movement change.  She denies urinary symptoms.  She denies vaginal discharge or bleeding.  The history is provided by the patient and medical records.  Abdominal Pain Pain location:  Epigastric Pain quality: aching and cramping   Pain radiates to:  Does not radiate Pain severity:  Moderate Onset quality:  Gradual Duration:  8 hours Timing:  Constant Progression:  Waxing and waning Chronicity:  Recurrent Relieved by:  Nothing Worsened by:  Nothing      Past Medical History:  Diagnosis Date  . Anxiety   . Arthritis    rheumatoid , osteoarthritis  . Bronchitis   . Chronic back pain   . Depression   . Diabetes mellitus   . Fatty liver   . Fibromyalgia   . GERD (gastroesophageal reflux disease)   . Headache   . Hypertension   . Pancreatitis   . Pneumonia   . Rotator cuff tear, right     Patient Active Problem List   Diagnosis Date Noted  . Abdominal pain 11/23/2019  . Acute pancreatitis 11/21/2019  . Complete tear of right rotator cuff 07/22/2017    Class: Chronic  . Retained orthopedic hardware 07/22/2017    Class: Chronic  . Cervical disc herniation 07/21/2017    Class: Acute  . Status post cervical spinal fusion 07/21/2017  . Chronic back pain       Past Surgical History:  Procedure Laterality Date  . ABDOMINAL HYSTERECTOMY    . ANTERIOR CERVICAL DECOMP/DISCECTOMY FUSION N/A 07/21/2017   Procedure: ANTERIOR CERVICAL DISCECTOMY AND FUSION C6-7, REMOVAL OF SCREW C6 PLATE, DEPUY ZERO-P IMPLANT, LOCAL BONE GRAFT, ALLOGRAFT BONE GRAFT, VIVIGEN;  Surgeon: Jessy Oto, MD;  Location: [facility];  Service: Orthopedics;  Laterality: N/A;  . BACK SURGERY     laminectomy  . BONE GRAFT HIP ILIAC CREST    . BREAST BIOPSY    . CARPAL TUNNEL RELEASE    . CERVICAL DISCECTOMY  07/21/2017  . CESAREAN SECTION    . CHOLECYSTECTOMY    . COLONOSCOPY    . frontal fusion    . neck fusion     patient reported  . SHOULDER ARTHROSCOPY WITH SUBACROMIAL DECOMPRESSION, ROTATOR CUFF REPAIR AND BICEP TENDON REPAIR Right 10/31/2017   Procedure: RIGHT SHOULDER ARTHROSCOPY, MANIPULATION UNDER ANESTHESIA, BICEPS TENODESIS, AND MINI OPEN ROTATOR CUFF TEAR REPAIR;  Surgeon: Meredith Pel, MD;  Location: [facility];  Service: Orthopedics;  Laterality: Right;     OB History   No obstetric history on file.     Family History  Problem Relation Age of Onset  . Hypertension Father   . Renal Disease Father   . Diabetes Mother   . Hypertension Mother   . Edema Mother   . Diabetes Sister   . Diabetes Brother  Social History   Tobacco Use  . Smoking status: Current Every Day Smoker    Packs/day: 0.50    Types: Cigarettes  . Smokeless tobacco: Never Used  Vaping Use  . Vaping Use: Some days  Substance Use Topics  . Alcohol use: No  . Drug use: Yes    Types: Marijuana    Home Medications Prior to Admission medications   Medication Sig Start Date End Date Taking? Authorizing Provider  albuterol (PROVENTIL HFA;VENTOLIN HFA) 108 (90 BASE) MCG/ACT inhaler Inhale 2 puffs into the lungs every 4 (four) hours as needed for wheezing.    [provider]  albuterol (PROVENTIL) (2.5 MG/3ML) 0.083% nebulizer solution Take 3 mLs (2.5 mg total) by  nebulization every 6 (six) hours as needed for wheezing or shortness of breath. 12/12/19   Tasia Catchings, Amy V, PA-C  ALPRAZolam Duanne Moron) 1 MG tablet Take 1 mg by mouth daily as needed for anxiety. 10/23/19   [provider]  AZOR 5-40 MG per tablet Take 1 tablet by mouth daily.  02/06/14   [provider]  cetirizine (ZYRTEC) 10 MG tablet Take 10 mg by mouth as needed for allergies.  04/18/20   [provider]  cyclobenzaprine (FLEXERIL) 10 MG tablet Take 10 mg by mouth 2 (two) times daily as needed. 02/10/20   [provider]  dicyclomine (BENTYL) 10 MG capsule Take 10 mg by mouth 3 (three) times daily as needed for spasms.  05/05/20   [provider]  escitalopram (LEXAPRO) 10 MG tablet Take 10 mg by mouth daily. 05/30/20   [provider]  insulin glargine (LANTUS) 100 unit/mL SOPN Inject 40 Units into the skin at bedtime.     [provider]  JANUVIA 100 MG tablet Take 100 mg by mouth daily. 03/28/20   [provider]  metFORMIN (GLUCOPHAGE) 1000 MG tablet Take 1,000 mg by mouth 2 (two) times daily. 07/29/17   [provider]  metoCLOPramide (REGLAN) 10 MG tablet Take 1 tablet (10 mg total) by mouth every 6 (six) hours. 05/17/20   Garald Balding, PA-C  metoprolol succinate (TOPROL-XL) 50 MG 24 hr tablet Take 50 mg by mouth daily. 07/21/14   [provider]  omeprazole (PRILOSEC) 40 MG capsule Take 1 capsule every morning at least 30 minutes before first dose of Carafate. 04/24/20   Molpus, Jenny Reichmann, MD  ondansetron (ZOFRAN ODT) 8 MG disintegrating tablet Take 1 tablet (8 mg total) by mouth every 8 (eight) hours as needed for nausea or vomiting. 04/24/20   Molpus, Jenny Reichmann, MD  ondansetron (ZOFRAN-ODT) 4 MG disintegrating tablet Take 4 mg by mouth every 8 (eight) hours as needed for nausea/vomiting. 05/30/20   [provider]  pantoprazole (PROTONIX) 40 MG tablet Take 40 mg by mouth daily. 06/13/20   [provider]    phentermine (ADIPEX-P) 37.5 MG tablet Take 37.5 mg by mouth daily. 01/19/20   [provider]  simvastatin (ZOCOR) 20 MG tablet Take 20 mg by mouth daily.     [provider]  sucralfate (CARAFATE) 1 g tablet Take 1 tablet (1 g total) by mouth 4 (four) times daily -  with meals and at bedtime. 04/24/20   Molpus, John, MD  tiZANidine (ZANAFLEX) 2 MG tablet Take 1 tablet (2 mg total) by mouth every 8 (eight) hours as needed for muscle spasms. Patient not taking: Reported on 05/17/2020 02/09/20   Ok Edwards, PA-C  traMADol (ULTRAM) 50 MG tablet Take 25-50 mg by mouth 2 (  two) times daily as needed for pain. 06/13/20   [provider]  TRULICITY 9.50 DT/2.6ZT SOPN Inject 0.75 mg into the skin once a week. Mondays 11/06/19   [provider]  VIIBRYD 40 MG TABS Take 40 mg by mouth daily. 06/03/19   [provider]  DEXILANT 60 MG capsule Take 1 capsule (60 mg total) by mouth daily. 01/25/20 04/24/20  Jacqlyn Larsen, PA-C  promethazine (PHENERGAN) 25 MG tablet Take 1 tablet (25 mg total) by mouth every 6 (six) hours as needed for nausea or vomiting. 12/02/19 04/24/20  [name], PA-C    Allergies    Patient has no known allergies.  Review of Systems   Review of Systems  Gastrointestinal: Positive for abdominal pain.  All other systems reviewed and are negative.   Physical Exam Updated Vital Signs BP (!) 172/96 (BP Location: Left Arm)   Pulse (!) 104   Temp 99.1 F (37.3 C) (Oral)   Resp (!) 34   SpO2 95%   Physical Exam Vitals and nursing note reviewed.  Constitutional:      General: She is not in acute distress.    Appearance: She is well-developed.  HENT:     Head: Normocephalic and atraumatic.  Eyes:     Conjunctiva/sclera: Conjunctivae normal.     Pupils: Pupils are equal, round, and reactive to light.  Cardiovascular:     Rate and Rhythm: Normal rate and regular rhythm.     Heart sounds: Normal heart sounds.  Pulmonary:     Effort:  Pulmonary effort is normal. No respiratory distress.     Breath sounds: Normal breath sounds.  Abdominal:     General: There is no distension.     Palpations: Abdomen is soft.     Tenderness: There is abdominal tenderness in the epigastric area.  Musculoskeletal:        General: No deformity. Normal range of motion.     Cervical back: Normal range of motion and neck supple.  Skin:    General: Skin is warm and dry.  Neurological:     Mental Status: She is alert and oriented to person, place, and time.     ED Results / Procedures / Treatments   Labs (all labs ordered are listed, but only abnormal results are displayed) Labs Reviewed  COMPREHENSIVE METABOLIC PANEL - Abnormal; Notable for the following components:      Result Value   Glucose, Bld 225 (*)    All other components within normal limits  CBC WITH DIFFERENTIAL/PLATELET - Abnormal; Notable for the following components:   WBC 11.7 (*)    RBC 5.27 (*)    Neutro Abs 9.9 (*)    All other components within normal limits  URINALYSIS, ROUTINE W REFLEX MICROSCOPIC - Abnormal; Notable for the following components:   Glucose, UA >=500 (*)    Hgb urine dipstick MODERATE (*)    Ketones, ur 80 (*)    All other components within normal limits  LIPASE, BLOOD    EKG EKG Interpretation  Date/Time:  Saturday June 17 2020 11:55:30 EDT Ventricular Rate:  93 PR Interval:    QRS Duration: 100 QT Interval:  404 QTC Calculation: 503 R Axis:   58 Text Interpretation: Sinus rhythm Probable anteroseptal infarct, old Confirmed by Dene Gentry [phone]) on 06/17/2020 12:03:52 PM   Radiology CT ABDOMEN PELVIS W CONTRAST  Result Date: 06/17/2020 CLINICAL DATA:  Mid abdomen pain worsening today no fever. EXAM: CT ABDOMEN AND PELVIS WITH CONTRAST  TECHNIQUE: Multidetector CT imaging of the abdomen and pelvis was performed using the standard protocol following bolus administration of intravenous contrast. CONTRAST:  151mL OMNIPAQUE IOHEXOL  300 MG/ML  SOLN COMPARISON:  CT abdomen pelvis 04/24/2020 FINDINGS: Lower chest: No acute abnormality. Hepatobiliary: No focal liver abnormality is seen. Status post cholecystectomy. No biliary dilatation. Pancreas: Unremarkable. No pancreatic ductal dilatation or surrounding inflammatory changes. Spleen: Normal in size without focal abnormality. Adrenals/Urinary Tract: Normal right adrenal gland. Stable left adrenal gland nodules. Kidneys are normal, without renal calculi, focal lesion, or hydronephrosis. Bladder is unremarkable. Stomach/Bowel: Stomach is within normal limits. Appendix appears normal. No evidence of bowel wall thickening, distention, or inflammatory changes. Vascular/Lymphatic: Mild aortic atherosclerosis. No enlarged abdominal or pelvic lymph nodes. Reproductive: Status post hysterectomy. No adnexal masses. Other: Small fat containing ventral hernia just above the umbilicus. Musculoskeletal: No acute or significant osseous findings. Lower lumbar postsurgical changes. IMPRESSION: 1. No acute findings in the abdomen or pelvis. 2. Small fat containing ventral hernia just above the umbilicus. 3. Aortic atherosclerosis. Aortic Atherosclerosis (ICD10-I70.0). Electronically Signed   By: Audie Pinto M.D.   On: 06/17/2020 13:33    Procedures Procedures (including critical care time)  Medications Ordered in ED Medications  LORazepam (ATIVAN) injection 0.5 mg (has no administration in time range)  sodium chloride 0.9 % bolus 500 mL (500 mLs Intravenous New Bag/Given 06/17/20 1232)  morphine 4 MG/ML injection 4 mg (4 mg Intravenous Given 06/17/20 1231)  ondansetron (ZOFRAN) injection 4 mg (4 mg Intravenous Given 06/17/20 1231)  famotidine (PEPCID) IVPB 20 mg premix (0 mg Intravenous Stopped 06/17/20 1245)  iohexol (OMNIPAQUE) 300 MG/ML solution 100 mL (100 mLs Intravenous Contrast Given 06/17/20 1248)    ED Course  I have reviewed the triage vital signs and the nursing notes.  Pertinent  labs & imaging results that were available during my care of the patient were reviewed by me and considered in my medical decision making (see chart for details).    MDM Rules/Calculators/A&P                          MDM  Screen complete  [name] was evaluated in Emergency Department on 06/17/2020 for the symptoms described in the history of present illness. She was evaluated in the context of the global COVID-19 pandemic, which necessitated consideration that the patient might be at risk for infection with the SARS-CoV-2 virus that causes COVID-19. Institutional protocols and algorithms that pertain to the evaluation of patients at risk for COVID-19 are in a state of rapid change based on information released by regulatory bodies including the CDC and federal and state organizations. These policies and algorithms were followed during the patient's care in the ED.  Is presenting for evaluation of reported nausea and vomiting.  Patient's symptoms have been ongoing issue for quite some time.  Patient is already known to [facility].    Patient's symptoms may be related to marijuana use per GI notes.  Patient's work-up today does not demonstrate acute abnormality.  Labs and CT are without significant findings.    Patient does seem to be improved after IV fluids and antinausea medications.  Patient is appropriate for discharge.  Importance of close follow-up is stressed.  Strict return precautions given understood.   Final Clinical Impression(s) / ED Diagnoses Final diagnoses:  Nausea and vomiting, intractability of vomiting not specified, unspecified vomiting type    Rx / DC Orders ED Discharge Orders  None       Valarie Merino, MD 06/17/20 1439

## 2020-06-17 NOTE — ED Triage Notes (Signed)
Pt arrived via walk in, c/o diffuse abd pain and vomiting since this morning. States she has had ongoing issues with this for "awhile". Denies any diarrhea. Denies any other sx at this time.

## 2020-07-12 ENCOUNTER — Emergency Department (HOSPITAL_COMMUNITY): Payer: Medicare Other

## 2020-07-12 ENCOUNTER — Emergency Department (HOSPITAL_COMMUNITY)
Admission: EM | Admit: 2020-07-12 | Discharge: 2020-07-12 | Disposition: A | Discharge status: Home / Self Care | Payer: Medicare Other | Attending: Emergency Medicine | Admitting: Emergency Medicine

## 2020-07-12 ENCOUNTER — Encounter (HOSPITAL_COMMUNITY): Payer: Self-pay | Admitting: Emergency Medicine

## 2020-07-12 ENCOUNTER — Other Ambulatory Visit: Payer: Self-pay

## 2020-07-12 DIAGNOSIS — R1013 Epigastric pain: Secondary | ICD-10-CM | POA: Diagnosis not present

## 2020-07-12 DIAGNOSIS — I10 Essential (primary) hypertension: Secondary | ICD-10-CM | POA: Diagnosis not present

## 2020-07-12 DIAGNOSIS — K219 Gastro-esophageal reflux disease without esophagitis: Secondary | ICD-10-CM | POA: Insufficient documentation

## 2020-07-12 DIAGNOSIS — Z7984 Long term (current) use of oral hypoglycemic drugs: Secondary | ICD-10-CM | POA: Diagnosis not present

## 2020-07-12 DIAGNOSIS — E119 Type 2 diabetes mellitus without complications: Secondary | ICD-10-CM | POA: Diagnosis not present

## 2020-07-12 DIAGNOSIS — R112 Nausea with vomiting, unspecified: Secondary | ICD-10-CM | POA: Diagnosis not present

## 2020-07-12 DIAGNOSIS — Z794 Long term (current) use of insulin: Secondary | ICD-10-CM | POA: Diagnosis not present

## 2020-07-12 DIAGNOSIS — F17210 Nicotine dependence, cigarettes, uncomplicated: Secondary | ICD-10-CM | POA: Insufficient documentation

## 2020-07-12 DIAGNOSIS — F1590 Other stimulant use, unspecified, uncomplicated: Secondary | ICD-10-CM | POA: Insufficient documentation

## 2020-07-12 DIAGNOSIS — Z79899 Other long term (current) drug therapy: Secondary | ICD-10-CM | POA: Insufficient documentation

## 2020-07-12 LAB — I-STAT CHEM 8, ED
BUN: 8 mg/dL (ref 6–20)
Calcium, Ion: 1.17 mmol/L (ref 1.15–1.40)
Chloride: 98 mmol/L (ref 98–111)
Creatinine, Ser: 0.5 mg/dL (ref 0.44–1.00)
Glucose, Bld: 169 mg/dL — ABNORMAL HIGH (ref 70–99)
HCT: 48 % — ABNORMAL HIGH (ref 36.0–46.0)
Hemoglobin: 16.3 g/dL — ABNORMAL HIGH (ref 12.0–15.0)
Potassium: 3.9 mmol/L (ref 3.5–5.1)
Sodium: 137 mmol/L (ref 135–145)
TCO2: 27 mmol/L (ref 22–32)

## 2020-07-12 LAB — CBC
HCT: 45.9 % (ref 36.0–46.0)
Hemoglobin: 15.3 g/dL — ABNORMAL HIGH (ref 12.0–15.0)
MCH: 28.3 pg (ref 26.0–34.0)
MCHC: 33.3 g/dL (ref 30.0–36.0)
MCV: 84.8 fL (ref 80.0–100.0)
Platelets: 378 10*3/uL (ref 150–400)
RBC: 5.41 MIL/uL — ABNORMAL HIGH (ref 3.87–5.11)
RDW: 13.7 % (ref 11.5–15.5)
WBC: 9.6 10*3/uL (ref 4.0–10.5)
nRBC: 0 % (ref 0.0–0.2)

## 2020-07-12 LAB — URINALYSIS, ROUTINE W REFLEX MICROSCOPIC
Bilirubin Urine: NEGATIVE
Glucose, UA: 50 mg/dL — AB
Ketones, ur: 80 mg/dL — AB
Leukocytes,Ua: NEGATIVE
Nitrite: NEGATIVE
Protein, ur: NEGATIVE mg/dL
Specific Gravity, Urine: 1.02 (ref 1.005–1.030)
pH: 5 (ref 5.0–8.0)

## 2020-07-12 LAB — COMPREHENSIVE METABOLIC PANEL
ALT: 14 U/L (ref 0–44)
AST: 13 U/L — ABNORMAL LOW (ref 15–41)
Albumin: 4.7 g/dL (ref 3.5–5.0)
Alkaline Phosphatase: 94 U/L (ref 38–126)
Anion gap: 13 (ref 5–15)
BUN: 9 mg/dL (ref 6–20)
CO2: 27 mmol/L (ref 22–32)
Calcium: 9.5 mg/dL (ref 8.9–10.3)
Chloride: 97 mmol/L — ABNORMAL LOW (ref 98–111)
Creatinine, Ser: 0.69 mg/dL (ref 0.44–1.00)
GFR, Estimated: >60 mL/min (ref >60)
Glucose, Bld: 174 mg/dL — ABNORMAL HIGH (ref 70–99)
Potassium: 3.9 mmol/L (ref 3.5–5.1)
Sodium: 137 mmol/L (ref 135–145)
Total Bilirubin: 0.7 mg/dL (ref 0.3–1.2)
Total Protein: 8 g/dL (ref 6.5–8.1)

## 2020-07-12 LAB — LIPASE, BLOOD: Lipase: 36 U/L (ref 11–51)

## 2020-07-12 LAB — I-STAT BETA HCG BLOOD, ED (MC, WL, AP ONLY): I-stat hCG, quantitative: <5 m[IU]/mL (ref <5)

## 2020-07-12 MED ORDER — DIPHENHYDRAMINE HCL 50 MG/ML IJ SOLN
25.0000 mg | Freq: Once | INTRAMUSCULAR | Status: AC
  Administered 2020-07-12: 25 mg via INTRAVENOUS
  Filled 2020-07-12: qty 1

## 2020-07-12 MED ORDER — MORPHINE SULFATE (PF) 4 MG/ML IV SOLN
4.0000 mg | Freq: Once | INTRAVENOUS | Status: AC
  Administered 2020-07-12: 4 mg via INTRAVENOUS
  Filled 2020-07-12: qty 1

## 2020-07-12 MED ORDER — ALUM & MAG HYDROXIDE-SIMETH 200-200-20 MG/5ML PO SUSP
30.0000 mL | Freq: Once | ORAL | Status: AC
  Administered 2020-07-12: 30 mL via ORAL
  Filled 2020-07-12: qty 30

## 2020-07-12 MED ORDER — SODIUM CHLORIDE 0.9 % IV BOLUS
1000.0000 mL | Freq: Once | INTRAVENOUS | Status: AC
  Administered 2020-07-12: 1000 mL via INTRAVENOUS

## 2020-07-12 MED ORDER — SODIUM CHLORIDE (PF) 0.9 % IJ SOLN
INTRAMUSCULAR | Status: AC
  Filled 2020-07-12: qty 50

## 2020-07-12 MED ORDER — PROCHLORPERAZINE EDISYLATE 10 MG/2ML IJ SOLN
10.0000 mg | Freq: Once | INTRAMUSCULAR | Status: AC
  Administered 2020-07-12: 10 mg via INTRAVENOUS
  Filled 2020-07-12: qty 2

## 2020-07-12 MED ORDER — IOHEXOL 300 MG/ML  SOLN
100.0000 mL | Freq: Once | INTRAMUSCULAR | Status: AC | PRN
  Administered 2020-07-12: 100 mL via INTRAVENOUS

## 2020-07-12 NOTE — ED Triage Notes (Signed)
Patient c/o abdominal pain with N/V/D since endoscopy with biopsy on 11/23.

## 2020-07-12 NOTE — Discharge Instructions (Signed)
Try to avoid things that may make this worse, most commonly these are spicy foods tomato based products fatty foods chocolate and peppermint.  Alcohol and tobacco can also make this worse.  Return to the emergency department for sudden worsening pain fever or inability to eat or drink.

## 2020-07-12 NOTE — ED Notes (Signed)
Patient transported to CT 

## 2020-07-12 NOTE — ED Provider Notes (Signed)
[facility] DEPT Provider Note   CSN: [id number] Arrival date & time: 07/12/20  1123     History Chief Complaint  Patient presents with  . Abdominal Pain    [name] is a 54 y.o. female.  54 yo F with a cc of epigastric abdominal pain nausea and vomiting.  This is been going on for about a week now.  The patient had an upper endoscopy done 2 days prior to the onset of her symptoms.  She had some biopsies taken as well.  Has had difficulty tolerating anything by mouth including water.  No overt fevers.  No diarrhea.  The history is provided by the patient.  Abdominal Pain Pain location:  Epigastric Pain quality: sharp and shooting   Pain radiates to:  Does not radiate Pain severity:  Moderate Onset quality:  Gradual Duration:  7 days Timing:  Constant Progression:  Worsening Chronicity:  Recurrent Relieved by:  Nothing Worsened by:  Nothing Ineffective treatments:  None tried Associated symptoms: nausea and vomiting   Associated symptoms: no chest pain, no chills, no dysuria, no fever and no shortness of breath        Past Medical History:  Diagnosis Date  . Anxiety   . Arthritis    rheumatoid , osteoarthritis  . Bronchitis   . Chronic back pain   . Depression   . Diabetes mellitus   . Fatty liver   . Fibromyalgia   . GERD (gastroesophageal reflux disease)   . Headache   . Hypertension   . Pancreatitis   . Pneumonia   . Rotator cuff tear, right     Patient Active Problem List   Diagnosis Date Noted  . Abdominal pain 11/23/2019  . Acute pancreatitis 11/21/2019  . Complete tear of right rotator cuff 07/22/2017    Class: Chronic  . Retained orthopedic hardware 07/22/2017    Class: Chronic  . Cervical disc herniation 07/21/2017    Class: Acute  . Status post cervical spinal fusion 07/21/2017  . Chronic back pain     Past Surgical History:  Procedure Laterality Date  . ABDOMINAL HYSTERECTOMY    . ANTERIOR  CERVICAL DECOMP/DISCECTOMY FUSION N/A 07/21/2017   Procedure: ANTERIOR CERVICAL DISCECTOMY AND FUSION C6-7, REMOVAL OF SCREW C6 PLATE, DEPUY ZERO-P IMPLANT, LOCAL BONE GRAFT, ALLOGRAFT BONE GRAFT, VIVIGEN;  Surgeon: Jessy Oto, MD;  Location: [facility];  Service: Orthopedics;  Laterality: N/A;  . BACK SURGERY     laminectomy  . BONE GRAFT HIP ILIAC CREST    . BREAST BIOPSY    . CARPAL TUNNEL RELEASE    . CERVICAL DISCECTOMY  07/21/2017  . CESAREAN SECTION    . CHOLECYSTECTOMY    . COLONOSCOPY    . frontal fusion    . neck fusion     patient reported  . SHOULDER ARTHROSCOPY WITH SUBACROMIAL DECOMPRESSION, ROTATOR CUFF REPAIR AND BICEP TENDON REPAIR Right 10/31/2017   Procedure: RIGHT SHOULDER ARTHROSCOPY, MANIPULATION UNDER ANESTHESIA, BICEPS TENODESIS, AND MINI OPEN ROTATOR CUFF TEAR REPAIR;  Surgeon: Meredith Pel, MD;  Location: [facility];  Service: Orthopedics;  Laterality: Right;     OB History   No obstetric history on file.     Family History  Problem Relation Age of Onset  . Hypertension Father   . Renal Disease Father   . Diabetes Mother   . Hypertension Mother   . Edema Mother   . Diabetes Sister   . Diabetes Brother  Social History   Tobacco Use  . Smoking status: Current Every Day Smoker    Packs/day: 0.50    Types: Cigarettes  . Smokeless tobacco: Never Used  Vaping Use  . Vaping Use: Some days  Substance Use Topics  . Alcohol use: No  . Drug use: Yes    Types: Marijuana    Home Medications Prior to Admission medications   Medication Sig Start Date End Date Taking? Authorizing Provider  albuterol (PROVENTIL HFA;VENTOLIN HFA) 108 (90 BASE) MCG/ACT inhaler Inhale 2 puffs into the lungs every 4 (four) hours as needed for wheezing.    [provider]  albuterol (PROVENTIL) (2.5 MG/3ML) 0.083% nebulizer solution Take 3 mLs (2.5 mg total) by nebulization every 6 (six) hours as needed for wheezing or shortness of breath. 12/12/19   Tasia Catchings, Amy V, PA-C    ALPRAZolam Duanne Moron) 1 MG tablet Take 1 mg by mouth daily as needed for anxiety. 10/23/19   [provider]  AZOR 5-40 MG per tablet Take 1 tablet by mouth daily.  02/06/14   [provider]  cetirizine (ZYRTEC) 10 MG tablet Take 10 mg by mouth as needed for allergies.  04/18/20   [provider]  cyclobenzaprine (FLEXERIL) 10 MG tablet Take 10 mg by mouth 2 (two) times daily as needed. 02/10/20   [provider]  dicyclomine (BENTYL) 10 MG capsule Take 10 mg by mouth 3 (three) times daily as needed for spasms.  05/05/20   [provider]  escitalopram (LEXAPRO) 10 MG tablet Take 10 mg by mouth daily. 05/30/20   [provider]  insulin glargine (LANTUS) 100 unit/mL SOPN Inject 40 Units into the skin at bedtime.     [provider]  JANUVIA 100 MG tablet Take 100 mg by mouth daily. 03/28/20   [provider]  metFORMIN (GLUCOPHAGE) 1000 MG tablet Take 1,000 mg by mouth 2 (two) times daily. 07/29/17   [provider]  metoCLOPramide (REGLAN) 10 MG tablet Take 1 tablet (10 mg total) by mouth every 6 (six) hours. 05/17/20   Garald Balding, PA-C  metoprolol succinate (TOPROL-XL) 50 MG 24 hr tablet Take 50 mg by mouth daily. 07/21/14   [provider]  omeprazole (PRILOSEC) 40 MG capsule Take 1 capsule every morning at least 30 minutes before first dose of Carafate. 04/24/20   Molpus, Jenny Reichmann, MD  ondansetron (ZOFRAN ODT) 8 MG disintegrating tablet Take 1 tablet (8 mg total) by mouth every 8 (eight) hours as needed for nausea or vomiting. 04/24/20   Molpus, Jenny Reichmann, MD  ondansetron (ZOFRAN) 4 MG tablet Take 1 tablet (4 mg total) by mouth every 6 (six) hours. 06/17/20   Valarie Merino, MD  ondansetron (ZOFRAN-ODT) 4 MG disintegrating tablet Take 4 mg by mouth every 8 (eight) hours as needed for nausea/vomiting. 05/30/20   [provider]  pantoprazole (PROTONIX) 40 MG tablet Take 40 mg by mouth daily. 06/13/20   [provider]  phentermine (ADIPEX-P) 37.5 MG tablet Take 37.5 mg by mouth daily. 01/19/20   [provider]  promethazine (PHENERGAN) 25 MG suppository Place 1 suppository (25 mg total) rectally every 6 (six) hours as needed for nausea or vomiting. 06/17/20   Valarie Merino, MD  simvastatin (ZOCOR) 20 MG tablet Take 20 mg by mouth daily.     [provider]  sucralfate (CARAFATE) 1 g tablet Take 1 tablet (1 g total) by mouth 4 (four) times daily -  with meals and at  bedtime. 04/24/20   Molpus, John, MD  tiZANidine (ZANAFLEX) 2 MG tablet Take 1 tablet (2 mg total) by mouth every 8 (eight) hours as needed for muscle spasms. Patient not taking: Reported on 05/17/2020 02/09/20   Ok Edwards, PA-C  traMADol (ULTRAM) 50 MG tablet Take 25-50 mg by mouth 2 (two) times daily as needed for pain. 06/13/20   [provider]  TRULICITY 1.82 XH/3.7JI SOPN Inject 0.75 mg into the skin once a week. Mondays 11/06/19   [provider]  VIIBRYD 40 MG TABS Take 40 mg by mouth daily. 06/03/19   [provider]  DEXILANT 60 MG capsule Take 1 capsule (60 mg total) by mouth daily. 01/25/20 04/24/20  Jacqlyn Larsen, PA-C    Allergies    Patient has no known allergies.  Review of Systems   Review of Systems  Constitutional: Negative for chills and fever.  HENT: Negative for congestion and rhinorrhea.   Eyes: Negative for redness and visual disturbance.  Respiratory: Negative for shortness of breath and wheezing.   Cardiovascular: Negative for chest pain and palpitations.  Gastrointestinal: Positive for abdominal pain, nausea and vomiting.  Genitourinary: Negative for dysuria and urgency.  Musculoskeletal: Negative for arthralgias and myalgias.  Skin: Negative for pallor and wound.  Neurological: Negative for dizziness and headaches.    Physical Exam Updated Vital Signs BP 136/82   Pulse 77   Temp 98.6 F (37 C) (Oral)   Resp 15   SpO2 91%   Physical Exam Vitals and  nursing note reviewed.  Constitutional:      General: She is not in acute distress.    Appearance: She is well-developed. She is not diaphoretic.  HENT:     Head: Normocephalic and atraumatic.  Eyes:     Pupils: Pupils are equal, round, and reactive to light.  Cardiovascular:     Rate and Rhythm: Normal rate and regular rhythm.     Heart sounds: No murmur heard.  No friction rub. No gallop.   Pulmonary:     Effort: Pulmonary effort is normal.     Breath sounds: No wheezing or rales.  Abdominal:     General: There is no distension.     Palpations: Abdomen is soft.     Tenderness: There is abdominal tenderness (diffuse, worst to the epigastrium and ruq).  Musculoskeletal:        General: No tenderness.     Cervical back: Normal range of motion and neck supple.  Skin:    General: Skin is warm and dry.  Neurological:     Mental Status: She is alert and oriented to person, place, and time.  Psychiatric:        Behavior: Behavior normal.     ED Results / Procedures / Treatments   Labs (all labs ordered are listed, but only abnormal results are displayed) Labs Reviewed  COMPREHENSIVE METABOLIC PANEL - Abnormal; Notable for the following components:      Result Value   Chloride 97 (*)    Glucose, Bld 174 (*)    AST 13 (*)    All other components within normal limits  CBC - Abnormal; Notable for the following components:   RBC 5.41 (*)    Hemoglobin 15.3 (*)    All other components within normal limits  URINALYSIS, ROUTINE W REFLEX MICROSCOPIC - Abnormal; Notable for the following components:   APPearance HAZY (*)    Glucose, UA 50 (*)    Hgb urine dipstick MODERATE (*)  Ketones, ur 80 (*)    Bacteria, UA RARE (*)    All other components within normal limits  I-STAT CHEM 8, ED - Abnormal; Notable for the following components:   Glucose, Bld 169 (*)    Hemoglobin 16.3 (*)    HCT 48.0 (*)    All other components within normal limits  LIPASE, BLOOD  I-STAT BETA HCG  BLOOD, ED ([facility], [facility], [facility] ONLY)    EKG None  Radiology CT ABDOMEN PELVIS W CONTRAST  Result Date: 07/12/2020 CLINICAL DATA:  Nonlocalized abdominal pain with nausea and vomiting. History of endoscopy and biopsy on 11/23. EXAM: CT ABDOMEN AND PELVIS WITH CONTRAST TECHNIQUE: Multidetector CT imaging of the abdomen and pelvis was performed using the standard protocol following bolus administration of intravenous contrast. CONTRAST:  125mL OMNIPAQUE IOHEXOL 300 MG/ML  SOLN COMPARISON:  CT abdomen and pelvis June 17, 2020. FINDINGS: Lower chest: Bibasilar atelectasis for scarring. Hepatobiliary: No focal liver abnormality is seen. Status post cholecystectomy. No biliary dilatation. Pancreas: Unremarkable. No pancreatic ductal dilatation or surrounding inflammatory changes. Spleen: Normal in size without focal abnormality. Adrenals/Urinary Tract: Stable 1.4 cm left adrenal nodule with central area of calcification. Normal right adrenal gland. No hydronephrosis. No suspicious renal masses. No renal stones. The urinary bladder limiting evaluation Stomach/Bowel: The stomach is decompressed limiting evaluation for wall thickening. Appendix appears normal. No evidence of bowel wall thickening, distention or inflammatory changes. Vascular/Lymphatic: Scattered atherosclerotic calcifications of the aorta and iliac arteries. No pathologically enlarged lymph nodes. Reproductive: Status post hysterectomy. No adnexal masses. Other: Small fat containing ventral hernia. Musculoskeletal: No acute osseous abnormality. No suspicious lytic or blastic lesions of bone. Lower lumbar postsurgical changes with L4-L5 and L5-S1 disc spacers, similar prior. IMPRESSION: 1. No acute findings in the abdomen or pelvis. 2. Small fat containing ventral hernia. 3. Aortic atherosclerosis. Aortic Atherosclerosis (ICD10-I70.0). Electronically Signed   By: Dahlia Bailiff MD   On: 07/12/2020 13:52    Procedures Procedures (including critical care  time)  Medications Ordered in ED Medications  sodium chloride (PF) 0.9 % injection (has no administration in time range)  sodium chloride 0.9 % bolus 1,000 mL (1,000 mLs Intravenous New Bag/Given 07/12/20 1309)  morphine 4 MG/ML injection 4 mg (4 mg Intravenous Given 07/12/20 1309)  prochlorperazine (COMPAZINE) injection 10 mg (10 mg Intravenous Given 07/12/20 1310)  diphenhydrAMINE (BENADRYL) injection 25 mg (25 mg Intravenous Given 07/12/20 1309)  iohexol (OMNIPAQUE) 300 MG/ML solution 100 mL (100 mLs Intravenous Contrast Given 07/12/20 1330)  alum & mag hydroxide-simeth (MAALOX/MYLANTA) 200-200-20 MG/5ML suspension 30 mL (30 mLs Oral Given 07/12/20 1439)    ED Course  I have reviewed the triage vital signs and the nursing notes.  Pertinent labs & imaging results that were available during my care of the patient were reviewed by me and considered in my medical decision making (see chart for details).    MDM Rules/Calculators/A&P                          54 yo F with a cc of upper abdominal pain post endoscopy.  Hx of recurrent epigastric abdominal pain, likely same, but with recent procedure will obtain CT to eval for perf, pain and nausea meds.  Reassess.   CT scan abdomen pelvis negative for acute pathology. Patient tolerating by mouth. Feeling mildly better on reassessment. Will discharge home. GI follow-up.  3:12 PM:  I have discussed the diagnosis/risks/treatment options with the patient and believe the  pt to be eligible for discharge home to follow-up with GI. We also discussed returning to the ED immediately if new or worsening sx occur. We discussed the sx which are most concerning (e.g., sudden worsening pain, fever, inability to tolerate by mouth) that necessitate immediate return. Medications administered to the patient during their visit and any new prescriptions provided to the patient are listed below.  Medications given during this visit Medications  sodium chloride (PF) 0.9  % injection (has no administration in time range)  sodium chloride 0.9 % bolus 1,000 mL (1,000 mLs Intravenous New Bag/Given 07/12/20 1309)  morphine 4 MG/ML injection 4 mg (4 mg Intravenous Given 07/12/20 1309)  prochlorperazine (COMPAZINE) injection 10 mg (10 mg Intravenous Given 07/12/20 1310)  diphenhydrAMINE (BENADRYL) injection 25 mg (25 mg Intravenous Given 07/12/20 1309)  iohexol (OMNIPAQUE) 300 MG/ML solution 100 mL (100 mLs Intravenous Contrast Given 07/12/20 1330)  alum & mag hydroxide-simeth (MAALOX/MYLANTA) 200-200-20 MG/5ML suspension 30 mL (30 mLs Oral Given 07/12/20 1439)     The patient appears reasonably screen and/or stabilized for discharge and I doubt any other medical condition or other [facility] requiring further screening, evaluation, or treatment in the ED at this time prior to discharge.   Final Clinical Impression(s) / ED Diagnoses Final diagnoses:  Epigastric abdominal pain    Rx / DC Orders ED Discharge Orders    None       Deno Etienne, DO 07/12/20 1512

## 2020-07-14 ENCOUNTER — Ambulatory Visit
Admission: RE | Admit: 2020-07-14 | Discharge: 2020-07-14 | Disposition: A | Discharge status: Home / Self Care | Payer: Medicare Other | Source: Ambulatory Visit | Attending: Internal Medicine | Admitting: Internal Medicine

## 2020-07-14 ENCOUNTER — Other Ambulatory Visit: Payer: Self-pay

## 2020-07-14 DIAGNOSIS — Z1231 Encounter for screening mammogram for malignant neoplasm of breast: Secondary | ICD-10-CM

## 2020-07-16 ENCOUNTER — Encounter (HOSPITAL_COMMUNITY): Payer: Self-pay

## 2020-07-16 ENCOUNTER — Other Ambulatory Visit: Payer: Self-pay

## 2020-07-16 ENCOUNTER — Emergency Department (HOSPITAL_COMMUNITY)
Admission: EM | Admit: 2020-07-16 | Discharge: 2020-07-17 | Disposition: A | Discharge status: Home / Self Care | Payer: Medicare Other | Attending: Emergency Medicine | Admitting: Emergency Medicine

## 2020-07-16 DIAGNOSIS — K859 Acute pancreatitis without necrosis or infection, unspecified: Secondary | ICD-10-CM | POA: Insufficient documentation

## 2020-07-16 DIAGNOSIS — Z7984 Long term (current) use of oral hypoglycemic drugs: Secondary | ICD-10-CM | POA: Insufficient documentation

## 2020-07-16 DIAGNOSIS — F17210 Nicotine dependence, cigarettes, uncomplicated: Secondary | ICD-10-CM | POA: Diagnosis not present

## 2020-07-16 DIAGNOSIS — Z794 Long term (current) use of insulin: Secondary | ICD-10-CM | POA: Diagnosis not present

## 2020-07-16 DIAGNOSIS — E119 Type 2 diabetes mellitus without complications: Secondary | ICD-10-CM | POA: Diagnosis not present

## 2020-07-16 DIAGNOSIS — R1013 Epigastric pain: Secondary | ICD-10-CM | POA: Diagnosis not present

## 2020-07-16 DIAGNOSIS — R1010 Upper abdominal pain, unspecified: Secondary | ICD-10-CM

## 2020-07-16 DIAGNOSIS — Z79899 Other long term (current) drug therapy: Secondary | ICD-10-CM | POA: Diagnosis not present

## 2020-07-16 DIAGNOSIS — E860 Dehydration: Secondary | ICD-10-CM

## 2020-07-16 DIAGNOSIS — K219 Gastro-esophageal reflux disease without esophagitis: Secondary | ICD-10-CM | POA: Insufficient documentation

## 2020-07-16 DIAGNOSIS — I10 Essential (primary) hypertension: Secondary | ICD-10-CM | POA: Insufficient documentation

## 2020-07-16 DIAGNOSIS — R112 Nausea with vomiting, unspecified: Secondary | ICD-10-CM

## 2020-07-16 DIAGNOSIS — F1290 Cannabis use, unspecified, uncomplicated: Secondary | ICD-10-CM

## 2020-07-16 DIAGNOSIS — R3129 Other microscopic hematuria: Secondary | ICD-10-CM

## 2020-07-16 LAB — URINALYSIS, ROUTINE W REFLEX MICROSCOPIC
Bacteria, UA: NONE SEEN
Bilirubin Urine: NEGATIVE
Glucose, UA: 50 mg/dL — AB
Ketones, ur: 80 mg/dL — AB
Nitrite: NEGATIVE
Protein, ur: NEGATIVE mg/dL
Specific Gravity, Urine: 1.02 (ref 1.005–1.030)
pH: 7 (ref 5.0–8.0)

## 2020-07-16 LAB — COMPREHENSIVE METABOLIC PANEL
ALT: 13 U/L (ref 0–44)
AST: 30 U/L (ref 15–41)
Albumin: 4.7 g/dL (ref 3.5–5.0)
Alkaline Phosphatase: 88 U/L (ref 38–126)
Anion gap: 13 (ref 5–15)
BUN: 9 mg/dL (ref 6–20)
CO2: 22 mmol/L (ref 22–32)
Calcium: 9.1 mg/dL (ref 8.9–10.3)
Chloride: 100 mmol/L (ref 98–111)
Creatinine, Ser: 0.67 mg/dL (ref 0.44–1.00)
GFR, Estimated: >60 mL/min (ref >60)
Glucose, Bld: 170 mg/dL — ABNORMAL HIGH (ref 70–99)
Potassium: 5 mmol/L (ref 3.5–5.1)
Sodium: 135 mmol/L (ref 135–145)
Total Bilirubin: 1.4 mg/dL — ABNORMAL HIGH (ref 0.3–1.2)
Total Protein: 8.1 g/dL (ref 6.5–8.1)

## 2020-07-16 LAB — RAPID URINE DRUG SCREEN, HOSP PERFORMED
Amphetamines: NOT DETECTED
Barbiturates: NOT DETECTED
Benzodiazepines: POSITIVE — AB
Cocaine: NOT DETECTED
Opiates: NOT DETECTED
Tetrahydrocannabinol: POSITIVE — AB

## 2020-07-16 LAB — CBC
HCT: 45 % (ref 36.0–46.0)
Hemoglobin: 14.9 g/dL (ref 12.0–15.0)
MCH: 28.1 pg (ref 26.0–34.0)
MCHC: 33.1 g/dL (ref 30.0–36.0)
MCV: 84.7 fL (ref 80.0–100.0)
Platelets: 359 10*3/uL (ref 150–400)
RBC: 5.31 MIL/uL — ABNORMAL HIGH (ref 3.87–5.11)
RDW: 14 % (ref 11.5–15.5)
WBC: 13.3 10*3/uL — ABNORMAL HIGH (ref 4.0–10.5)
nRBC: 0 % (ref 0.0–0.2)

## 2020-07-16 LAB — LIPASE, BLOOD: Lipase: 31 U/L (ref 11–51)

## 2020-07-16 MED ORDER — FAMOTIDINE IN NACL 20-0.9 MG/50ML-% IV SOLN
20.0000 mg | Freq: Once | INTRAVENOUS | Status: AC
  Administered 2020-07-16: 20 mg via INTRAVENOUS
  Filled 2020-07-16: qty 50

## 2020-07-16 MED ORDER — LABETALOL HCL 5 MG/ML IV SOLN
10.0000 mg | Freq: Once | INTRAVENOUS | Status: AC
  Administered 2020-07-16: 10 mg via INTRAVENOUS
  Filled 2020-07-16: qty 4

## 2020-07-16 MED ORDER — ONDANSETRON 8 MG PO TBDP: 8.0000 mg | ORAL_TABLET | Freq: Three times a day (TID) | ORAL | 0 refills | Status: DC | PRN

## 2020-07-16 MED ORDER — ONDANSETRON HCL 4 MG/2ML IJ SOLN
4.0000 mg | Freq: Once | INTRAMUSCULAR | Status: AC
  Administered 2020-07-16: 4 mg via INTRAVENOUS
  Filled 2020-07-16: qty 2

## 2020-07-16 MED ORDER — LORAZEPAM 2 MG/ML IJ SOLN
0.5000 mg | Freq: Once | INTRAMUSCULAR | Status: AC
  Administered 2020-07-16: 0.5 mg via INTRAVENOUS
  Filled 2020-07-16: qty 1

## 2020-07-16 MED ORDER — HYDROMORPHONE HCL 1 MG/ML IJ SOLN
0.5000 mg | Freq: Once | INTRAMUSCULAR | Status: AC
  Administered 2020-07-16: 0.5 mg via INTRAVENOUS
  Filled 2020-07-16: qty 1

## 2020-07-16 MED ORDER — SODIUM CHLORIDE 0.9 % IV BOLUS
1000.0000 mL | Freq: Once | INTRAVENOUS | Status: AC
  Administered 2020-07-16: 1000 mL via INTRAVENOUS

## 2020-07-16 NOTE — Discharge Instructions (Addendum)
It was our pleasure to provide your ER care today - we hope that you feel better.  Rest. Drink plenty of fluids. Take zofran as need for nausea.  Note that increasingly we are seeing marijuana/thc as the cause of a recurrent upper abdominal pain and recurrent vomiting syndrome called 'Cannabinoid Hyperemesis Syndrome' - see attached information. In these cases, avoiding THC use leads to prevention of symptoms from recurring.   Follow up with your doctor in 1-2 days if symptoms fail to improve/resolve. Also, your blood pressure is high - continue your meds, limit salt intake, and follow up with your doctor for recheck in the coming week. There is also microscopic blood in urine - follow up with your doctor in 1-2 weeks.   Return to ER if worse, new symptoms, fevers, worsening or severe pain, persistent vomiting, or other concern.

## 2020-07-16 NOTE — ED Triage Notes (Signed)
Patient arrived stating that she is having mid upper abdominal pain that started around 4am today. Reports N/V. Took pepto bismol and Prilosec with no relief. States this is a reoccurring problem, seen here on the 1st for same.

## 2020-07-16 NOTE — ED Provider Notes (Signed)
[facility] DEPT Provider Note   CSN: [id number] Arrival date & time: 07/16/20  2139     History Chief Complaint  Patient presents with  . Abdominal Pain    [name] is a 54 y.o. female.  Patient c/o epigastric abd pain and nausea/vomiting since this AM. Symptoms acute onset, moderate, constant, persistent, non radiating. +hx same pain previously, several times. Emesis clear or color ingested fluids, not bilious or bloody. No abd distension. Is having normal bms. No back or flank pain. No fever or chills. No chest pain or sob. +hx thc use. Pt reports recent EGD for same, and indicates was told it was fine.   The history is provided by the patient.  Abdominal Pain Associated symptoms: nausea and vomiting   Associated symptoms: no chest pain, no chills, no constipation, no diarrhea, no dysuria, no fever, no shortness of breath and no sore throat        Past Medical History:  Diagnosis Date  . Anxiety   . Arthritis    rheumatoid , osteoarthritis  . Bronchitis   . Chronic back pain   . Depression   . Diabetes mellitus   . Fatty liver   . Fibromyalgia   . GERD (gastroesophageal reflux disease)   . Headache   . Hypertension   . Pancreatitis   . Pneumonia   . Rotator cuff tear, right     Patient Active Problem List   Diagnosis Date Noted  . Abdominal pain 11/23/2019  . Acute pancreatitis 11/21/2019  . Complete tear of right rotator cuff 07/22/2017    Class: Chronic  . Retained orthopedic hardware 07/22/2017    Class: Chronic  . Cervical disc herniation 07/21/2017    Class: Acute  . Status post cervical spinal fusion 07/21/2017  . Chronic back pain     Past Surgical History:  Procedure Laterality Date  . ABDOMINAL HYSTERECTOMY    . ANTERIOR CERVICAL DECOMP/DISCECTOMY FUSION N/A 07/21/2017   Procedure: ANTERIOR CERVICAL DISCECTOMY AND FUSION C6-7, REMOVAL OF SCREW C6 PLATE, DEPUY ZERO-P IMPLANT, LOCAL BONE GRAFT,  ALLOGRAFT BONE GRAFT, VIVIGEN;  Surgeon: Jessy Oto, MD;  Location: [facility];  Service: Orthopedics;  Laterality: N/A;  . BACK SURGERY     laminectomy  . BONE GRAFT HIP ILIAC CREST    . BREAST BIOPSY    . CARPAL TUNNEL RELEASE    . CERVICAL DISCECTOMY  07/21/2017  . CESAREAN SECTION    . CHOLECYSTECTOMY    . COLONOSCOPY    . frontal fusion    . neck fusion     patient reported  . SHOULDER ARTHROSCOPY WITH SUBACROMIAL DECOMPRESSION, ROTATOR CUFF REPAIR AND BICEP TENDON REPAIR Right 10/31/2017   Procedure: RIGHT SHOULDER ARTHROSCOPY, MANIPULATION UNDER ANESTHESIA, BICEPS TENODESIS, AND MINI OPEN ROTATOR CUFF TEAR REPAIR;  Surgeon: Meredith Pel, MD;  Location: [facility];  Service: Orthopedics;  Laterality: Right;     OB History   No obstetric history on file.     Family History  Problem Relation Age of Onset  . Hypertension Father   . Renal Disease Father   . Diabetes Mother   . Hypertension Mother   . Edema Mother   . Diabetes Sister   . Diabetes Brother     Social History   Tobacco Use  . Smoking status: Current Every Day Smoker    Packs/day: 0.50    Types: Cigarettes  . Smokeless tobacco: Never Used  Vaping Use  . Vaping Use: Some  days  Substance Use Topics  . Alcohol use: No  . Drug use: Yes    Types: Marijuana    Home Medications Prior to Admission medications   Medication Sig Start Date End Date Taking? Authorizing Provider  albuterol (PROVENTIL HFA;VENTOLIN HFA) 108 (90 BASE) MCG/ACT inhaler Inhale 2 puffs into the lungs every 4 (four) hours as needed for wheezing.    [provider]  albuterol (PROVENTIL) (2.5 MG/3ML) 0.083% nebulizer solution Take 3 mLs (2.5 mg total) by nebulization every 6 (six) hours as needed for wheezing or shortness of breath. 12/12/19   Tasia Catchings, Amy V, PA-C  ALPRAZolam Duanne Moron) 1 MG tablet Take 1 mg by mouth daily as needed for anxiety. 10/23/19   [provider]  AZOR 5-40 MG per tablet Take 1 tablet by mouth daily.   02/06/14   [provider]  cetirizine (ZYRTEC) 10 MG tablet Take 10 mg by mouth as needed for allergies.  04/18/20   [provider]  cyclobenzaprine (FLEXERIL) 10 MG tablet Take 10 mg by mouth 2 (two) times daily as needed. 02/10/20   [provider]  dicyclomine (BENTYL) 10 MG capsule Take 10 mg by mouth 3 (three) times daily as needed for spasms.  05/05/20   [provider]  escitalopram (LEXAPRO) 10 MG tablet Take 10 mg by mouth daily. 05/30/20   [provider]  insulin glargine (LANTUS) 100 unit/mL SOPN Inject 40 Units into the skin at bedtime.     [provider]  JANUVIA 100 MG tablet Take 100 mg by mouth daily. 03/28/20   [provider]  metFORMIN (GLUCOPHAGE) 1000 MG tablet Take 1,000 mg by mouth 2 (two) times daily. 07/29/17   [provider]  metoCLOPramide (REGLAN) 10 MG tablet Take 1 tablet (10 mg total) by mouth every 6 (six) hours. 05/17/20   Garald Balding, PA-C  metoprolol succinate (TOPROL-XL) 50 MG 24 hr tablet Take 50 mg by mouth daily. 07/21/14   [provider]  omeprazole (PRILOSEC) 40 MG capsule Take 1 capsule every morning at least 30 minutes before first dose of Carafate. 04/24/20   Molpus, Jenny Reichmann, MD  ondansetron (ZOFRAN ODT) 8 MG disintegrating tablet Take 1 tablet (8 mg total) by mouth every 8 (eight) hours as needed for nausea or vomiting. 04/24/20   Molpus, Jenny Reichmann, MD  ondansetron (ZOFRAN) 4 MG tablet Take 1 tablet (4 mg total) by mouth every 6 (six) hours. 06/17/20   Valarie Merino, MD  ondansetron (ZOFRAN-ODT) 4 MG disintegrating tablet Take 4 mg by mouth every 8 (eight) hours as needed for nausea/vomiting. 05/30/20   [provider]  pantoprazole (PROTONIX) 40 MG tablet Take 40 mg by mouth daily. 06/13/20   [provider]  phentermine (ADIPEX-P) 37.5 MG tablet Take 37.5 mg by mouth daily. 01/19/20   [provider]  promethazine (PHENERGAN) 25 MG suppository Place 1  suppository (25 mg total) rectally every 6 (six) hours as needed for nausea or vomiting. 06/17/20   Valarie Merino, MD  simvastatin (ZOCOR) 20 MG tablet Take 20 mg by mouth daily.     [provider]  sucralfate (CARAFATE) 1 g tablet Take 1 tablet (1 g total) by mouth 4 (four) times daily -  with meals and at bedtime. 04/24/20   Molpus, John, MD  tiZANidine (ZANAFLEX) 2 MG tablet Take 1 tablet (2 mg total) by mouth every 8 (eight) hours as needed for muscle spasms. Patient not taking: Reported on 05/17/2020 02/09/20  Tasia Catchings, Amy V, PA-C  traMADol (ULTRAM) 50 MG tablet Take 25-50 mg by mouth 2 (two) times daily as needed for pain. 06/13/20   [provider]  TRULICITY 1.49 FW/2.6VZ SOPN Inject 0.75 mg into the skin once a week. Mondays 11/06/19   [provider]  VIIBRYD 40 MG TABS Take 40 mg by mouth daily. 06/03/19   [provider]  DEXILANT 60 MG capsule Take 1 capsule (60 mg total) by mouth daily. 01/25/20 04/24/20  Jacqlyn Larsen, PA-C    Allergies    Patient has no known allergies.  Review of Systems   Review of Systems  Constitutional: Negative for chills and fever.  HENT: Negative for sore throat.   Eyes: Negative for redness.  Respiratory: Negative for shortness of breath.   Cardiovascular: Negative for chest pain.  Gastrointestinal: Positive for abdominal pain, nausea and vomiting. Negative for constipation and diarrhea.  Genitourinary: Negative for dysuria and flank pain.  Musculoskeletal: Negative for back pain and neck pain.  Skin: Negative for rash.  Neurological: Negative for headaches.  Hematological: Does not bruise/bleed easily.  Psychiatric/Behavioral: Negative for confusion.    Physical Exam Updated Vital Signs BP (!) 173/117 (BP Location: Left Arm)   Pulse 85   Temp 99 F (37.2 C) (Oral)   Resp 16   SpO2 99%   Physical Exam Vitals and nursing note reviewed.  Constitutional:      Appearance: Normal appearance. She is  well-developed.  HENT:     Head: Atraumatic.     Nose: Nose normal.     Mouth/Throat:     Mouth: Mucous membranes are moist.  Eyes:     General: No scleral icterus.    Conjunctiva/sclera: Conjunctivae normal.  Neck:     Trachea: No tracheal deviation.  Cardiovascular:     Rate and Rhythm: Normal rate and regular rhythm.     Pulses: Normal pulses.     Heart sounds: Normal heart sounds. No murmur heard.  No friction rub. No gallop.   Pulmonary:     Effort: Pulmonary effort is normal. No respiratory distress.     Breath sounds: Normal breath sounds.  Abdominal:     General: Bowel sounds are normal. There is no distension.     Palpations: Abdomen is soft. There is no mass.     Tenderness: There is no abdominal tenderness. There is no guarding or rebound.     Hernia: No hernia is present.  Genitourinary:    Comments: No cva tenderness.  Musculoskeletal:        General: No swelling.     Cervical back: Normal range of motion and neck supple. No rigidity. No muscular tenderness.  Skin:    General: Skin is warm and dry.     Findings: No rash.  Neurological:     Mental Status: She is alert.     Comments: Alert, speech normal.   Psychiatric:     Comments: Mildly anxious.      ED Results / Procedures / Treatments   Labs (all labs ordered are listed, but only abnormal results are displayed) Results for orders placed or performed during the hospital encounter of 07/16/20  Urinalysis, Routine w reflex microscopic Urine, Clean Catch  Result Value Ref Range   Color, Urine YELLOW YELLOW   APPearance HAZY (A) CLEAR   Specific Gravity, Urine 1.020 1.005 - 1.030   pH 7.0 5.0 - 8.0   Glucose, UA 50 (A) NEGATIVE mg/dL   Hgb urine dipstick  MODERATE (A) NEGATIVE   Bilirubin Urine NEGATIVE NEGATIVE   Ketones, ur 80 (A) NEGATIVE mg/dL   Protein, ur NEGATIVE NEGATIVE mg/dL   Nitrite NEGATIVE NEGATIVE   Leukocytes,Ua TRACE (A) NEGATIVE   RBC / HPF 21-50 0 - 5 RBC/hpf   WBC, UA 0-5 0 - 5  WBC/hpf   Bacteria, UA NONE SEEN NONE SEEN   Squamous Epithelial / LPF 6-10 0 - 5   Mucus PRESENT   Rapid urine drug screen (hospital performed)  Result Value Ref Range   Opiates NONE DETECTED NONE DETECTED   Cocaine NONE DETECTED NONE DETECTED   Benzodiazepines POSITIVE (A) NONE DETECTED   Amphetamines NONE DETECTED NONE DETECTED   Tetrahydrocannabinol POSITIVE (A) NONE DETECTED   Barbiturates NONE DETECTED NONE DETECTED   CT ABDOMEN PELVIS W CONTRAST  Result Date: 07/12/2020 CLINICAL DATA:  Nonlocalized abdominal pain with nausea and vomiting. History of endoscopy and biopsy on 11/23. EXAM: CT ABDOMEN AND PELVIS WITH CONTRAST TECHNIQUE: Multidetector CT imaging of the abdomen and pelvis was performed using the standard protocol following bolus administration of intravenous contrast. CONTRAST:  148mL OMNIPAQUE IOHEXOL 300 MG/ML  SOLN COMPARISON:  CT abdomen and pelvis June 17, 2020. FINDINGS: Lower chest: Bibasilar atelectasis for scarring. Hepatobiliary: No focal liver abnormality is seen. Status post cholecystectomy. No biliary dilatation. Pancreas: Unremarkable. No pancreatic ductal dilatation or surrounding inflammatory changes. Spleen: Normal in size without focal abnormality. Adrenals/Urinary Tract: Stable 1.4 cm left adrenal nodule with central area of calcification. Normal right adrenal gland. No hydronephrosis. No suspicious renal masses. No renal stones. The urinary bladder limiting evaluation Stomach/Bowel: The stomach is decompressed limiting evaluation for wall thickening. Appendix appears normal. No evidence of bowel wall thickening, distention or inflammatory changes. Vascular/Lymphatic: Scattered atherosclerotic calcifications of the aorta and iliac arteries. No pathologically enlarged lymph nodes. Reproductive: Status post hysterectomy. No adnexal masses. Other: Small fat containing ventral hernia. Musculoskeletal: No acute osseous abnormality. No suspicious lytic or blastic  lesions of bone. Lower lumbar postsurgical changes with L4-L5 and L5-S1 disc spacers, similar prior. IMPRESSION: 1. No acute findings in the abdomen or pelvis. 2. Small fat containing ventral hernia. 3. Aortic atherosclerosis. Aortic Atherosclerosis (ICD10-I70.0). Electronically Signed   By: Dahlia Bailiff MD   On: 07/12/2020 13:52   CT ABDOMEN PELVIS W CONTRAST  Result Date: 06/17/2020 CLINICAL DATA:  Mid abdomen pain worsening today no fever. EXAM: CT ABDOMEN AND PELVIS WITH CONTRAST TECHNIQUE: Multidetector CT imaging of the abdomen and pelvis was performed using the standard protocol following bolus administration of intravenous contrast. CONTRAST:  115mL OMNIPAQUE IOHEXOL 300 MG/ML  SOLN COMPARISON:  CT abdomen pelvis 04/24/2020 FINDINGS: Lower chest: No acute abnormality. Hepatobiliary: No focal liver abnormality is seen. Status post cholecystectomy. No biliary dilatation. Pancreas: Unremarkable. No pancreatic ductal dilatation or surrounding inflammatory changes. Spleen: Normal in size without focal abnormality. Adrenals/Urinary Tract: Normal right adrenal gland. Stable left adrenal gland nodules. Kidneys are normal, without renal calculi, focal lesion, or hydronephrosis. Bladder is unremarkable. Stomach/Bowel: Stomach is within normal limits. Appendix appears normal. No evidence of bowel wall thickening, distention, or inflammatory changes. Vascular/Lymphatic: Mild aortic atherosclerosis. No enlarged abdominal or pelvic lymph nodes. Reproductive: Status post hysterectomy. No adnexal masses. Other: Small fat containing ventral hernia just above the umbilicus. Musculoskeletal: No acute or significant osseous findings. Lower lumbar postsurgical changes. IMPRESSION: 1. No acute findings in the abdomen or pelvis. 2. Small fat containing ventral hernia just above the umbilicus. 3. Aortic atherosclerosis. Aortic Atherosclerosis (ICD10-I70.0). Electronically Signed   By:  Audie Pinto M.D.   On: 06/17/2020  13:33    EKG None  Radiology No results found.  Procedures Procedures (including critical care time)  Medications Ordered in ED Medications  LORazepam (ATIVAN) injection 0.5 mg (has no administration in time range)  HYDROmorphone (DILAUDID) injection 0.5 mg (has no administration in time range)  famotidine (PEPCID) IVPB 20 mg premix (has no administration in time range)  ondansetron (ZOFRAN) injection 4 mg (has no administration in time range)  sodium chloride 0.9 % bolus 1,000 mL (has no administration in time range)    ED Course  I have reviewed the triage vital signs and the nursing notes.  Pertinent labs & imaging results that were available during my care of the patient were reviewed by me and considered in my medical decision making (see chart for details).    MDM Rules/Calculators/A&P                         Iv ns bolus. zofran iv.  Ativan iv. Dilaudid .5 mg iv. Labs sent.  Reviewed nursing notes and prior charts for additional history.  Multiple prior evals for same including several cts neg for acute process, and recent egd neg for acute process.   Initial abs reviewed/interpreted by me - ua w ketones c/w dehydration. Ns bolus. UDS +THC.  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 CMet and lipase pending.   Trial of po fluids.   2330, chemistries/lipase, ivf, pending. Signed out to Dr Leonette Monarch to check labs, recheck patient, and disposition appropriately.    Final Clinical Impression(s) / ED Diagnoses Final diagnoses:  None    Rx / DC Orders ED Discharge Orders    None       Lajean Saver, MD 07/16/20 2333

## 2020-08-14 ENCOUNTER — Other Ambulatory Visit: Payer: Self-pay

## 2020-08-14 ENCOUNTER — Encounter (HOSPITAL_COMMUNITY): Payer: Self-pay

## 2020-08-14 DIAGNOSIS — Z5321 Procedure and treatment not carried out due to patient leaving prior to being seen by health care provider: Secondary | ICD-10-CM | POA: Diagnosis not present

## 2020-08-14 DIAGNOSIS — R1010 Upper abdominal pain, unspecified: Secondary | ICD-10-CM | POA: Diagnosis not present

## 2020-08-14 LAB — CBG MONITORING, ED: Glucose-Capillary: 256 mg/dL — ABNORMAL HIGH (ref 70–99)

## 2020-08-14 NOTE — ED Triage Notes (Signed)
Pt BIB EMS from home. Upper abdominal pain, N/V since Saturday. Pt has hx of pancreatitis.   BP 142/82 HR 80 95% RA

## 2020-08-15 ENCOUNTER — Ambulatory Visit
Admission: EM | Admit: 2020-08-15 | Discharge: 2020-08-15 | Disposition: A | Discharge status: Home / Self Care | Payer: Medicare Other | Attending: Emergency Medicine | Admitting: Emergency Medicine

## 2020-08-15 ENCOUNTER — Emergency Department (HOSPITAL_COMMUNITY)
Admission: EM | Admit: 2020-08-15 | Discharge: 2020-08-15 | Disposition: A | Discharge status: Home / Self Care | Payer: Medicare Other | Attending: Emergency Medicine | Admitting: Emergency Medicine

## 2020-08-15 DIAGNOSIS — R112 Nausea with vomiting, unspecified: Secondary | ICD-10-CM

## 2020-08-15 DIAGNOSIS — R1010 Upper abdominal pain, unspecified: Secondary | ICD-10-CM | POA: Diagnosis not present

## 2020-08-15 LAB — CBC
Hematocrit: 44.9 % (ref 34.0–46.6)
Hemoglobin: 15.2 g/dL (ref 11.1–15.9)
MCH: 28.3 pg (ref 26.6–33.0)
MCHC: 33.9 g/dL (ref 31.5–35.7)
MCV: 84 fL (ref 79–97)
Platelets: 360 10*3/uL (ref 150–450)
RBC: 5.37 x10E6/uL — ABNORMAL HIGH (ref 3.77–5.28)
RDW: 15.1 % (ref 11.7–15.4)
WBC: 10.4 10*3/uL (ref 3.4–10.8)

## 2020-08-15 LAB — COMPREHENSIVE METABOLIC PANEL
ALT: 10 IU/L (ref 0–32)
AST: 9 IU/L (ref 0–40)
Albumin/Globulin Ratio: 2 (ref 1.2–2.2)
Albumin: 5.1 g/dL — ABNORMAL HIGH (ref 3.8–4.9)
Alkaline Phosphatase: 132 IU/L — ABNORMAL HIGH (ref 44–121)
BUN/Creatinine Ratio: 19 (ref 9–23)
BUN: 8 mg/dL (ref 6–24)
Bilirubin Total: 0.5 mg/dL (ref 0.0–1.2)
CO2: 24 mmol/L (ref 20–29)
Calcium: 9.9 mg/dL (ref 8.7–10.2)
Chloride: 102 mmol/L (ref 96–106)
Creatinine, Ser: 0.42 mg/dL — ABNORMAL LOW (ref 0.57–1.00)
GFR calc Af Amer: 133 mL/min/{1.73_m2} (ref >59)
GFR calc non Af Amer: 116 mL/min/{1.73_m2} (ref >59)
Globulin, Total: 2.5 g/dL (ref 1.5–4.5)
Glucose: 268 mg/dL — ABNORMAL HIGH (ref 65–99)
Potassium: 3.8 mmol/L (ref 3.5–5.2)
Sodium: 139 mmol/L (ref 134–144)
Total Protein: 7.6 g/dL (ref 6.0–8.5)

## 2020-08-15 LAB — LIPASE: Lipase: 79 U/L — ABNORMAL HIGH (ref 14–72)

## 2020-08-15 MED ORDER — METOCLOPRAMIDE HCL 5 MG/ML IJ SOLN
10.0000 mg | Freq: Once | INTRAMUSCULAR | Status: AC
  Administered 2020-08-15: 10 mg via INTRAMUSCULAR

## 2020-08-15 MED ORDER — ONDANSETRON 4 MG PO TBDP
4.0000 mg | ORAL_TABLET | Freq: Once | ORAL | Status: AC
  Administered 2020-08-15: 4 mg via ORAL

## 2020-08-15 MED ORDER — PROMETHAZINE HCL 25 MG PO TABS: 25.0000 mg | ORAL_TABLET | Freq: Four times a day (QID) | ORAL | 0 refills | Status: DC | PRN

## 2020-08-15 MED ORDER — PROMETHAZINE HCL 25 MG RE SUPP: 25.0000 mg | Freq: Four times a day (QID) | RECTAL | 0 refills | Status: DC | PRN

## 2020-08-15 NOTE — ED Provider Notes (Signed)
[facility]    CSN: [id number] Arrival date & time: 08/15/20  [id number]      History   Chief Complaint Chief Complaint  Patient presents with  . Emesis  . Abdominal Pain    HPI Ryin Quinones is a 55 y.o. female history of hypertension, DM type II, presenting today for evaluation of abdominal pain.  Patient reports that she has had upper abdominal pain nausea and vomiting since Saturday.  Has a history of pancreatitis.  Over the past few months she has had recurrent episodes of abdominal pain nausea and vomiting where she has been seen in the emergency room.  Has had prior CTs which have been unremarkable, also recently following up with [facility] gastroenterology with endoscopy that also was unremarkable.  Per chart review some of patient's symptoms have been thought to possibly be related to hyperemesis from cannabis use.  HPI  Past Medical History:  Diagnosis Date  . Anxiety   . Arthritis    rheumatoid , osteoarthritis  . Bronchitis   . Chronic back pain   . Depression   . Diabetes mellitus   . Fatty liver   . Fibromyalgia   . GERD (gastroesophageal reflux disease)   . Headache   . Hypertension   . Pancreatitis   . Pneumonia   . Rotator cuff tear, right     Patient Active Problem List   Diagnosis Date Noted  . Abdominal pain 11/23/2019  . Acute pancreatitis 11/21/2019  . Complete tear of right rotator cuff 07/22/2017    Class: Chronic  . Retained orthopedic hardware 07/22/2017    Class: Chronic  . Cervical disc herniation 07/21/2017    Class: Acute  . Status post cervical spinal fusion 07/21/2017  . Chronic back pain     Past Surgical History:  Procedure Laterality Date  . ABDOMINAL HYSTERECTOMY    . ANTERIOR CERVICAL DECOMP/DISCECTOMY FUSION N/A 07/21/2017   Procedure: ANTERIOR CERVICAL DISCECTOMY AND FUSION C6-7, REMOVAL OF SCREW C6 PLATE, DEPUY ZERO-P IMPLANT, LOCAL BONE GRAFT, ALLOGRAFT BONE GRAFT, VIVIGEN;  Surgeon: Jessy Oto, MD;   Location: [facility];  Service: Orthopedics;  Laterality: N/A;  . BACK SURGERY     laminectomy  . BONE GRAFT HIP ILIAC CREST    . BREAST BIOPSY    . CARPAL TUNNEL RELEASE    . CERVICAL DISCECTOMY  07/21/2017  . CESAREAN SECTION    . CHOLECYSTECTOMY    . COLONOSCOPY    . frontal fusion    . neck fusion     patient reported  . SHOULDER ARTHROSCOPY WITH SUBACROMIAL DECOMPRESSION, ROTATOR CUFF REPAIR AND BICEP TENDON REPAIR Right 10/31/2017   Procedure: RIGHT SHOULDER ARTHROSCOPY, MANIPULATION UNDER ANESTHESIA, BICEPS TENODESIS, AND MINI OPEN ROTATOR CUFF TEAR REPAIR;  Surgeon: Meredith Pel, MD;  Location: [facility];  Service: Orthopedics;  Laterality: Right;    OB History   No obstetric history on file.      Home Medications    Prior to Admission medications   Medication Sig Start Date End Date Taking? Authorizing Provider  promethazine (PHENERGAN) 25 MG suppository Place 1 suppository (25 mg total) rectally every 6 (six) hours as needed for nausea or vomiting. 08/15/20  Yes Rajveer Handler C, PA-C  promethazine (PHENERGAN) 25 MG tablet Take 1 tablet (25 mg total) by mouth every 6 (six) hours as needed for nausea or vomiting. 08/15/20  Yes Lynnda Wiersma C, PA-C  albuterol (PROVENTIL HFA;VENTOLIN HFA) 108 (90 BASE) MCG/ACT inhaler Inhale 2 puffs into  the lungs every 4 (four) hours as needed for wheezing.    [provider]  albuterol (PROVENTIL) (2.5 MG/3ML) 0.083% nebulizer solution Take 3 mLs (2.5 mg total) by nebulization every 6 (six) hours as needed for wheezing or shortness of breath. 12/12/19   Tasia Catchings, Amy V, PA-C  ALPRAZolam Duanne Moron) 1 MG tablet Take 1 mg by mouth daily as needed for anxiety. 10/23/19   [provider]  AZOR 5-40 MG per tablet Take 1 tablet by mouth daily.  02/06/14   [provider]  cetirizine (ZYRTEC) 10 MG tablet Take 10 mg by mouth as needed for allergies.  04/18/20   [provider]  cyclobenzaprine (FLEXERIL) 10 MG tablet Take 10 mg by  mouth 2 (two) times daily as needed. 02/10/20   [provider]  dicyclomine (BENTYL) 10 MG capsule Take 10 mg by mouth 3 (three) times daily as needed for spasms.  05/05/20   [provider]  escitalopram (LEXAPRO) 10 MG tablet Take 10 mg by mouth daily. 05/30/20   [provider]  insulin glargine (LANTUS) 100 unit/mL SOPN Inject 40 Units into the skin at bedtime.     [provider]  JANUVIA 100 MG tablet Take 100 mg by mouth daily. 03/28/20   [provider]  metFORMIN (GLUCOPHAGE) 1000 MG tablet Take 1,000 mg by mouth 2 (two) times daily. 07/29/17   [provider]  metoprolol succinate (TOPROL-XL) 50 MG 24 hr tablet Take 50 mg by mouth daily. 07/21/14   [provider]  omeprazole (PRILOSEC) 40 MG capsule Take 1 capsule every morning at least 30 minutes before first dose of Carafate. 04/24/20   Molpus, John, MD  pantoprazole (PROTONIX) 40 MG tablet Take 40 mg by mouth daily. 06/13/20   [provider]  sucralfate (CARAFATE) 1 g tablet Take 1 tablet (1 g total) by mouth 4 (four) times daily -  with meals and at bedtime. 04/24/20   Molpus, John, MD  tiZANidine (ZANAFLEX) 2 MG tablet Take 1 tablet (2 mg total) by mouth every 8 (eight) hours as needed for muscle spasms. Patient not taking: Reported on 05/17/2020 02/09/20   Ok Edwards, PA-C  traMADol (ULTRAM) 50 MG tablet Take 25-50 mg by mouth 2 (two) times daily as needed for pain. 06/13/20   [provider]  TRULICITY [id number] SOPN Inject 0.75 mg into the skin once a week. Mondays 11/06/19   [provider]  VIIBRYD 40 MG TABS Take 40 mg by mouth daily. 06/03/19   [provider]  DEXILANT 60 MG capsule Take 1 capsule (60 mg total) by mouth daily. 01/25/20 04/24/20  Jacqlyn Larsen, PA-C    Family History Family History  Problem Relation Age of Onset  . Hypertension Father   . Renal Disease Father   . Diabetes Mother   . Hypertension Mother   . Edema  Mother   . Diabetes Sister   . Diabetes Brother     Social History Social History   Tobacco Use  . Smoking status: Current Every Day Smoker    Packs/day: 0.50    Types: Cigarettes  . Smokeless tobacco: Never Used  Vaping Use  . Vaping Use: Some days  Substance Use Topics  . Alcohol use: No  . Drug use: Yes    Types: Marijuana     Allergies   Patient has no known allergies.   Review of Systems Review of Systems  Constitutional: Negative for fever.  Respiratory: Negative for  shortness of breath.   Cardiovascular: Negative for chest pain.  Gastrointestinal: Positive for abdominal pain, nausea and vomiting. Negative for diarrhea.  Genitourinary: Negative for dysuria, flank pain, genital sores, hematuria, menstrual problem, vaginal bleeding, vaginal discharge and vaginal pain.  Musculoskeletal: Negative for back pain.  Skin: Negative for rash.  Neurological: Negative for dizziness, light-headedness and headaches.     Physical Exam Triage Vital Signs ED Triage Vitals  Enc Vitals Group     BP      Pulse      Resp      Temp      Temp src      SpO2      Weight      Height      Head Circumference      Peak Flow      Pain Score      Pain Loc      Pain Edu?      Excl. in GC?    No data found.  Updated Vital Signs BP (!) 187/117 (BP Location: Left Arm)   Pulse (!) 107   Temp 98.6 F (37 C) (Oral)   Resp 18   SpO2 97%   Visual Acuity Right Eye Distance:   Left Eye Distance:   Bilateral Distance:    Right Eye Near:   Left Eye Near:    Bilateral Near:     Physical Exam Vitals and nursing note reviewed.  Constitutional:      Appearance: She is well-developed and well-nourished.     Comments: Appears uncomfortable, lying on exam table, slow to move  HENT:     Head: Normocephalic and atraumatic.     Ears:     Comments: Bilateral ears without tenderness to palpation of external auricle, tragus and mastoid, EAC's without erythema or swelling, TM's with  good bony landmarks and cone of light. Non erythematous.     Nose: Nose normal.     Mouth/Throat:     Comments: Oral mucosa pink and moist, no tonsillar enlargement or exudate. Posterior pharynx patent and nonerythematous, no uvula deviation or swelling. Normal phonation. Eyes:     Conjunctiva/sclera: Conjunctivae normal.  Cardiovascular:     Rate and Rhythm: Normal rate and regular rhythm.  Pulmonary:     Effort: Pulmonary effort is normal. No respiratory distress.     Comments: Breathing comfortably at rest, CTABL, no wheezing, rales or other adventitious sounds auscultated Abdominal:     General: There is no distension.     Comments: Abdomen soft, nondistended, nontender to palpation of right and left lower quadrants, diffuse tenderness to palpation to bilateral upper quadrants, mid upper abdomen and epigastrium  Musculoskeletal:        General: Normal range of motion.     Cervical back: Neck supple.  Skin:    General: Skin is warm and dry.  Neurological:     Mental Status: She is alert and oriented to person, place, and time.  Psychiatric:        Mood and Affect: Mood and affect normal.      UC Treatments / Results  Labs (all labs ordered are listed, but only abnormal results are displayed) Labs Reviewed  CBC  COMPREHENSIVE METABOLIC PANEL  LIPASE    EKG   Radiology No results found.  Procedures Procedures (including critical care time)  Medications Ordered in UC Medications  ondansetron (ZOFRAN-ODT) disintegrating tablet 4 mg (4 mg Oral Given 08/15/20 1000)  metoCLOPramide (REGLAN) injection 10 mg (10 mg  Intramuscular Given 08/15/20 1056)    Initial Impression / Assessment and Plan / UC Course  I have reviewed the triage vital signs and the nursing notes.  Pertinent labs & imaging results that were available during my care of the patient were reviewed by me and considered in my medical decision making (see chart for details).     Patient initially provided  with Zofran ODT, no improvement in nausea after 30 minutes.  On evaluation due to intensity of pain, did recommend that patient likely would be better treated in the emergency room, but she was there last night and left before being seen this morning.  Will do trial of Reglan IM, stat blood work.   Minimal improvement with Reglan as well, has had improvement with Phenergan in the past, will send this into the pharmacy as we do not have this available to give to patient in clinic.  Sent in tablets as well as suppositories.  Advised patient to hold off a few hours before starting this given recent Zofran and Reglan use.  Push fluids, slowly transition diet.  We will call with results of blood work if abnormal/changing plan.  Patient to go back to emergency room if developing any worsening pain or persistent vomiting despite use of Phenergan.   Discussed strict return precautions. Patient verbalized understanding and is agreeable with plan.    Final Clinical Impressions(s) / UC Diagnoses   Final diagnoses:  Pain of upper abdomen  Intractable vomiting with nausea, unspecified vomiting type     Discharge Instructions     We gave you Zofran 4 mg and Reglan 10 mg today I have sent in oral Phenergan and suppositories, please use either or to further help with nausea and vomiting Please wait 2 to 3 hours before taking Blood work pending-I will call if abnormal, I will not call if normal Please stick to clear liquids, bland diet after tolerating liquids for 24 hours-rice toast crackers potatoes soups Please go to emergency room if developing worsening pain, persistent vomiting    ED Prescriptions    Medication Sig Dispense Auth. Provider   promethazine (PHENERGAN) 25 MG suppository Place 1 suppository (25 mg total) rectally every 6 (six) hours as needed for nausea or vomiting. 16 each Charlena Haub C, PA-C   promethazine (PHENERGAN) 25 MG tablet Take 1 tablet (25 mg total) by mouth every 6  (six) hours as needed for nausea or vomiting. 30 tablet Elby Blackwelder, Chance C, PA-C     PDMP not reviewed this encounter.   Janith Lima, [place] 08/15/20 1134

## 2020-08-15 NOTE — ED Notes (Signed)
Error in discharge documentation. Pt d/c'd home.

## 2020-08-15 NOTE — ED Triage Notes (Signed)
Pt c/o vomiting and abdominal pain since Saturday. States hx of pancreatitis. States went to ED last night but left before being seen.

## 2020-08-15 NOTE — Discharge Instructions (Addendum)
We gave you Zofran 4 mg and Reglan 10 mg today I have sent in oral Phenergan and suppositories, please use either or to further help with nausea and vomiting Please wait 2 to 3 hours before taking Blood work pending-I will call if abnormal, I will not call if normal Please stick to clear liquids, bland diet after tolerating liquids for 24 hours-rice toast crackers potatoes soups Please go to emergency room if developing worsening pain, persistent vomiting

## 2020-08-16 ENCOUNTER — Other Ambulatory Visit: Payer: Self-pay

## 2020-08-16 ENCOUNTER — Emergency Department (HOSPITAL_COMMUNITY)
Admission: EM | Admit: 2020-08-16 | Discharge: 2020-08-16 | Disposition: A | Discharge status: Home / Self Care | Payer: Medicare Other | Attending: Emergency Medicine | Admitting: Emergency Medicine

## 2020-08-16 DIAGNOSIS — R1013 Epigastric pain: Secondary | ICD-10-CM

## 2020-08-16 DIAGNOSIS — Z79899 Other long term (current) drug therapy: Secondary | ICD-10-CM | POA: Insufficient documentation

## 2020-08-16 DIAGNOSIS — Z794 Long term (current) use of insulin: Secondary | ICD-10-CM | POA: Diagnosis not present

## 2020-08-16 DIAGNOSIS — I10 Essential (primary) hypertension: Secondary | ICD-10-CM | POA: Diagnosis not present

## 2020-08-16 DIAGNOSIS — Z7984 Long term (current) use of oral hypoglycemic drugs: Secondary | ICD-10-CM | POA: Insufficient documentation

## 2020-08-16 DIAGNOSIS — E119 Type 2 diabetes mellitus without complications: Secondary | ICD-10-CM | POA: Insufficient documentation

## 2020-08-16 DIAGNOSIS — F17210 Nicotine dependence, cigarettes, uncomplicated: Secondary | ICD-10-CM | POA: Diagnosis not present

## 2020-08-16 DIAGNOSIS — K219 Gastro-esophageal reflux disease without esophagitis: Secondary | ICD-10-CM | POA: Insufficient documentation

## 2020-08-16 LAB — COMPREHENSIVE METABOLIC PANEL
ALT: 15 U/L (ref 0–44)
AST: 13 U/L — ABNORMAL LOW (ref 15–41)
Albumin: 5 g/dL (ref 3.5–5.0)
Alkaline Phosphatase: 87 U/L (ref 38–126)
Anion gap: 15 (ref 5–15)
BUN: 16 mg/dL (ref 6–20)
CO2: 24 mmol/L (ref 22–32)
Calcium: 9.6 mg/dL (ref 8.9–10.3)
Chloride: 100 mmol/L (ref 98–111)
Creatinine, Ser: 0.67 mg/dL (ref 0.44–1.00)
GFR, Estimated: >60 mL/min (ref >60)
Glucose, Bld: 241 mg/dL — ABNORMAL HIGH (ref 70–99)
Potassium: 4 mmol/L (ref 3.5–5.1)
Sodium: 139 mmol/L (ref 135–145)
Total Bilirubin: 0.8 mg/dL (ref 0.3–1.2)
Total Protein: 8.6 g/dL — ABNORMAL HIGH (ref 6.5–8.1)

## 2020-08-16 LAB — URINALYSIS, ROUTINE W REFLEX MICROSCOPIC
Bilirubin Urine: NEGATIVE
Glucose, UA: 150 mg/dL — AB
Ketones, ur: 80 mg/dL — AB
Leukocytes,Ua: NEGATIVE
Nitrite: NEGATIVE
Protein, ur: 100 mg/dL — AB
Specific Gravity, Urine: 1.028 (ref 1.005–1.030)
pH: 5 (ref 5.0–8.0)

## 2020-08-16 LAB — CBC
HCT: 48.8 % — ABNORMAL HIGH (ref 36.0–46.0)
Hemoglobin: 16 g/dL — ABNORMAL HIGH (ref 12.0–15.0)
MCH: 28.2 pg (ref 26.0–34.0)
MCHC: 32.8 g/dL (ref 30.0–36.0)
MCV: 86.1 fL (ref 80.0–100.0)
Platelets: 404 10*3/uL — ABNORMAL HIGH (ref 150–400)
RBC: 5.67 MIL/uL — ABNORMAL HIGH (ref 3.87–5.11)
RDW: 14.4 % (ref 11.5–15.5)
WBC: 13.8 10*3/uL — ABNORMAL HIGH (ref 4.0–10.5)
nRBC: 0 % (ref 0.0–0.2)

## 2020-08-16 LAB — LIPASE, BLOOD: Lipase: 27 U/L (ref 11–51)

## 2020-08-16 MED ORDER — HYDROMORPHONE HCL 1 MG/ML IJ SOLN
0.5000 mg | Freq: Once | INTRAMUSCULAR | Status: AC
Start: 2020-08-16 — End: 2020-08-16
  Administered 2020-08-16: 0.5 mg via INTRAVENOUS
  Filled 2020-08-16: qty 1

## 2020-08-16 MED ORDER — ONDANSETRON HCL 4 MG/2ML IJ SOLN
4.0000 mg | Freq: Once | INTRAMUSCULAR | Status: AC
  Administered 2020-08-16: 4 mg via INTRAVENOUS
  Filled 2020-08-16: qty 2

## 2020-08-16 MED ORDER — SODIUM CHLORIDE 0.9 % IV BOLUS
1000.0000 mL | Freq: Once | INTRAVENOUS | Status: AC
  Administered 2020-08-16: 1000 mL via INTRAVENOUS

## 2020-08-16 MED ORDER — HYDROMORPHONE HCL 1 MG/ML IJ SOLN
1.0000 mg | Freq: Once | INTRAMUSCULAR | Status: AC
Start: 2020-08-16 — End: 2020-08-16
  Administered 2020-08-16: 1 mg via INTRAVENOUS
  Filled 2020-08-16: qty 1

## 2020-08-16 MED ORDER — OMEPRAZOLE 20 MG PO CPDR
20.0000 mg | DELAYED_RELEASE_CAPSULE | Freq: Every day | ORAL | 0 refills | Status: DC
Start: 2020-08-16 — End: 2021-06-22

## 2020-08-16 NOTE — ED Triage Notes (Signed)
Pt arrived via walk in, c/o vomiting and diffuse abd pain, x4 days, saw PCP this morning, told to go to ER for possible pancreatitis. Hx of same.

## 2020-08-16 NOTE — Discharge Instructions (Addendum)
Call your primary care doctor or specialist as discussed in the next 2-3 days.   Return immediately back to the ER if:  Your symptoms worsen within the next 12-24 hours. You develop new symptoms such as new fevers, persistent vomiting, new pain, shortness of breath, or new weakness or numbness, or if you have any other concerns.  

## 2020-08-16 NOTE — ED Notes (Signed)
Discharged no concerns at this time.  °

## 2020-08-16 NOTE — ED Provider Notes (Signed)
[facility] DEPT Provider Note   CSN: [id number] Arrival date & time: 08/16/20  1141     History Chief Complaint  Patient presents with  . Abdominal Pain    [name] is a 55 y.o. female.  Patient presents with chief complaint of recurrent abdominal pain.  She had this pain multiple times over the course the last 6 months.  She has had GI evaluation that was reportedly unremarkable.  She had recurrence of this pain again within the last 2 days.  Describes as achy pain in the epigastric region similar to prior episodes.  She saw her primary care doctor today who sent her to the ER to evaluate for possible pancreatitis.  Patient denies any alcohol use.        Past Medical History:  Diagnosis Date  . Anxiety   . Arthritis    rheumatoid , osteoarthritis  . Bronchitis   . Chronic back pain   . Depression   . Diabetes mellitus   . Fatty liver   . Fibromyalgia   . GERD (gastroesophageal reflux disease)   . Headache   . Hypertension   . Pancreatitis   . Pneumonia   . Rotator cuff tear, right     Patient Active Problem List   Diagnosis Date Noted  . Abdominal pain 11/23/2019  . Acute pancreatitis 11/21/2019  . Complete tear of right rotator cuff 07/22/2017    Class: Chronic  . Retained orthopedic hardware 07/22/2017    Class: Chronic  . Cervical disc herniation 07/21/2017    Class: Acute  . Status post cervical spinal fusion 07/21/2017  . Chronic back pain     Past Surgical History:  Procedure Laterality Date  . ABDOMINAL HYSTERECTOMY    . ANTERIOR CERVICAL DECOMP/DISCECTOMY FUSION N/A 07/21/2017   Procedure: ANTERIOR CERVICAL DISCECTOMY AND FUSION C6-7, REMOVAL OF SCREW C6 PLATE, DEPUY ZERO-P IMPLANT, LOCAL BONE GRAFT, ALLOGRAFT BONE GRAFT, VIVIGEN;  Surgeon: Jessy Oto, MD;  Location: [facility];  Service: Orthopedics;  Laterality: N/A;  . BACK SURGERY     laminectomy  . BONE GRAFT HIP ILIAC CREST    . BREAST BIOPSY    .  CARPAL TUNNEL RELEASE    . CERVICAL DISCECTOMY  07/21/2017  . CESAREAN SECTION    . CHOLECYSTECTOMY    . COLONOSCOPY    . frontal fusion    . neck fusion     patient reported  . SHOULDER ARTHROSCOPY WITH SUBACROMIAL DECOMPRESSION, ROTATOR CUFF REPAIR AND BICEP TENDON REPAIR Right 10/31/2017   Procedure: RIGHT SHOULDER ARTHROSCOPY, MANIPULATION UNDER ANESTHESIA, BICEPS TENODESIS, AND MINI OPEN ROTATOR CUFF TEAR REPAIR;  Surgeon: Meredith Pel, MD;  Location: [facility];  Service: Orthopedics;  Laterality: Right;     OB History   No obstetric history on file.     Family History  Problem Relation Age of Onset  . Hypertension Father   . Renal Disease Father   . Diabetes Mother   . Hypertension Mother   . Edema Mother   . Diabetes Sister   . Diabetes Brother     Social History   Tobacco Use  . Smoking status: Current Every Day Smoker    Packs/day: 0.50    Types: Cigarettes  . Smokeless tobacco: Never Used  Vaping Use  . Vaping Use: Some days  Substance Use Topics  . Alcohol use: No  . Drug use: Yes    Types: Marijuana    Home Medications Prior to Admission medications  Medication Sig Start Date End Date Taking? Authorizing Provider  albuterol (PROVENTIL HFA;VENTOLIN HFA) 108 (90 BASE) MCG/ACT inhaler Inhale 2 puffs into the lungs every 4 (four) hours as needed for wheezing.   Yes [provider]  albuterol (PROVENTIL) (2.5 MG/3ML) 0.083% nebulizer solution Take 3 mLs (2.5 mg total) by nebulization every 6 (six) hours as needed for wheezing or shortness of breath. 12/12/19  Yes Yu, Amy V, PA-C  ALPRAZolam Prudy Feeler) 1 MG tablet Take 1 mg by mouth daily as needed for anxiety. 10/23/19  Yes [provider]  AZOR 5-40 MG per tablet Take 1 tablet by mouth daily.  02/06/14  Yes [provider]  cetirizine (ZYRTEC) 10 MG tablet Take 10 mg by mouth daily as needed for allergies. 04/18/20  Yes [provider]  cyclobenzaprine (FLEXERIL) 10 MG tablet  Take 10 mg by mouth 2 (two) times daily as needed for muscle spasms. 02/10/20  Yes [provider]  dicyclomine (BENTYL) 10 MG capsule Take 10 mg by mouth 3 (three) times daily as needed for spasms.  05/05/20  Yes [provider]  escitalopram (LEXAPRO) 10 MG tablet Take 10 mg by mouth daily as needed (depression). 05/30/20  Yes [provider]  fluticasone (FLONASE) 50 MCG/ACT nasal spray Place 1 spray into both nostrils daily.   Yes [provider]  insulin glargine (LANTUS) 100 unit/mL SOPN Inject 50 Units into the skin at bedtime.   Yes [provider]  metFORMIN (GLUCOPHAGE) 1000 MG tablet Take 1,000 mg by mouth 2 (two) times daily. 07/29/17  Yes [provider]  metoprolol succinate (TOPROL-XL) 50 MG 24 hr tablet Take 50 mg by mouth daily. 07/21/14  Yes [provider]  pantoprazole (PROTONIX) 40 MG tablet Take 40 mg by mouth daily.   Yes [provider]  promethazine (PHENERGAN) 25 MG suppository Place 1 suppository (25 mg total) rectally every 6 (six) hours as needed for nausea or vomiting. 08/15/20  Yes Wieters, Hallie C, PA-C  traMADol (ULTRAM) 50 MG tablet Take 25-50 mg by mouth 2 (two) times daily as needed for pain. 06/13/20  Yes [provider]  VIIBRYD 40 MG TABS Take 40 mg by mouth daily. 06/03/19  Yes [provider]  omeprazole (PRILOSEC) 40 MG capsule Take 1 capsule every morning at least 30 minutes before first dose of Carafate. Patient not taking: No sig reported 04/24/20   Molpus, Jonny Ruiz, MD  promethazine (PHENERGAN) 25 MG tablet Take 1 tablet (25 mg total) by mouth every 6 (six) hours as needed for nausea or vomiting. Patient not taking: No sig reported 08/15/20   Wieters, Hallie C, PA-C  sucralfate (CARAFATE) 1 g tablet Take 1 tablet (1 g total) by mouth 4 (four) times daily -  with meals and at bedtime. Patient not taking: Reported on 08/16/2020 04/24/20   Molpus, Jonny Ruiz, MD  tiZANidine (ZANAFLEX) 2 MG  tablet Take 1 tablet (2 mg total) by mouth every 8 (eight) hours as needed for muscle spasms. Patient not taking: No sig reported 02/09/20   Linward Headland V, PA-C  DEXILANT 60 MG capsule Take 1 capsule (60 mg total) by mouth daily. 01/25/20 04/24/20  Dartha Lodge, PA-C    Allergies    Patient has no known allergies.  Review of Systems   Review of Systems  Constitutional: Negative for fever.  HENT: Negative for ear pain.   Eyes: Negative for pain.  Respiratory: Negative for cough.   Cardiovascular: Negative for chest pain.  Gastrointestinal:  Positive for abdominal pain.  Genitourinary: Negative for flank pain.  Musculoskeletal: Negative for back pain.  Skin: Negative for rash.  Neurological: Negative for headaches.    Physical Exam Updated Vital Signs BP (!) 187/105   Pulse 98   Temp 98.5 F (36.9 C)   Resp 18   SpO2 93%   Physical Exam Constitutional:      General: She is not in acute distress.    Appearance: Normal appearance.  HENT:     Head: Normocephalic.     Nose: Nose normal.  Eyes:     Extraocular Movements: Extraocular movements intact.  Cardiovascular:     Rate and Rhythm: Normal rate.  Pulmonary:     Effort: Pulmonary effort is normal.  Abdominal:     Tenderness: There is abdominal tenderness in the epigastric area.  Musculoskeletal:        General: Normal range of motion.     Cervical back: Normal range of motion.  Neurological:     General: No focal deficit present.     Mental Status: She is alert. Mental status is at baseline.     ED Results / Procedures / Treatments   Labs (all labs ordered are listed, but only abnormal results are displayed) Labs Reviewed  COMPREHENSIVE METABOLIC PANEL - Abnormal; Notable for the following components:      Result Value   Glucose, Bld 241 (*)    Total Protein 8.6 (*)    AST 13 (*)    All other components within normal limits  CBC - Abnormal; Notable for the following components:   WBC 13.8 (*)    RBC 5.67 (*)     Hemoglobin 16.0 (*)    HCT 48.8 (*)    Platelets 404 (*)    All other components within normal limits  URINALYSIS, ROUTINE W REFLEX MICROSCOPIC - Abnormal; Notable for the following components:   Glucose, UA 150 (*)    Hgb urine dipstick MODERATE (*)    Ketones, ur 80 (*)    Protein, ur 100 (*)    Bacteria, UA RARE (*)    All other components within normal limits  LIPASE, BLOOD    EKG None  Radiology No results found.  Procedures Procedures (including critical care time)  Medications Ordered in ED Medications  HYDROmorphone (DILAUDID) injection 1 mg (has no administration in time range)  ondansetron (ZOFRAN) injection 4 mg (4 mg Intravenous Given 08/16/20 2110)  sodium chloride 0.9 % bolus 1,000 mL (1,000 mLs Intravenous New Bag/Given 08/16/20 2109)  HYDROmorphone (DILAUDID) injection 0.5 mg (0.5 mg Intravenous Given 08/16/20 2110)    ED Course  I have reviewed the triage vital signs and the nursing notes.  Pertinent labs & imaging results that were available during my care of the patient were reviewed by me and considered in my medical decision making (see chart for details).    MDM Rules/Calculators/A&P                          Labs unremarkable white count normal chemistry normal.  Lipase normal as well.  Review of her chart shows multiple visits for similar complaints.  CT abdomen pelvis performed multiple times in the last 3 months all are unremarkable.  No evidence of acute pancreatitis and lipase or other lab work.  I will recommend outpatient follow-up with GI in 2 or 3 days.  Advised immediate return for worsening symptoms fevers or any additional concerns.   Final  Clinical Impression(s) / ED Diagnoses Final diagnoses:  Epigastric pain    Rx / DC Orders ED Discharge Orders    None       Luna Fuse, MD 08/16/20 2224

## 2020-09-08 ENCOUNTER — Encounter (HOSPITAL_COMMUNITY): Payer: Self-pay

## 2020-09-08 ENCOUNTER — Emergency Department (HOSPITAL_COMMUNITY)
Admission: EM | Admit: 2020-09-08 | Discharge: 2020-09-08 | Disposition: A | Discharge status: Home / Self Care | Payer: Medicare Other | Attending: Emergency Medicine | Admitting: Emergency Medicine

## 2020-09-08 ENCOUNTER — Other Ambulatory Visit: Payer: Self-pay

## 2020-09-08 ENCOUNTER — Emergency Department (HOSPITAL_COMMUNITY): Payer: Medicare Other

## 2020-09-08 DIAGNOSIS — K219 Gastro-esophageal reflux disease without esophagitis: Secondary | ICD-10-CM | POA: Diagnosis not present

## 2020-09-08 DIAGNOSIS — E119 Type 2 diabetes mellitus without complications: Secondary | ICD-10-CM | POA: Insufficient documentation

## 2020-09-08 DIAGNOSIS — F17210 Nicotine dependence, cigarettes, uncomplicated: Secondary | ICD-10-CM | POA: Insufficient documentation

## 2020-09-08 DIAGNOSIS — R1013 Epigastric pain: Secondary | ICD-10-CM | POA: Diagnosis not present

## 2020-09-08 DIAGNOSIS — R109 Unspecified abdominal pain: Secondary | ICD-10-CM

## 2020-09-08 DIAGNOSIS — Z7984 Long term (current) use of oral hypoglycemic drugs: Secondary | ICD-10-CM | POA: Diagnosis not present

## 2020-09-08 DIAGNOSIS — Z9049 Acquired absence of other specified parts of digestive tract: Secondary | ICD-10-CM | POA: Diagnosis not present

## 2020-09-08 DIAGNOSIS — R197 Diarrhea, unspecified: Secondary | ICD-10-CM | POA: Diagnosis not present

## 2020-09-08 DIAGNOSIS — I10 Essential (primary) hypertension: Secondary | ICD-10-CM | POA: Diagnosis not present

## 2020-09-08 DIAGNOSIS — R1110 Vomiting, unspecified: Secondary | ICD-10-CM | POA: Insufficient documentation

## 2020-09-08 DIAGNOSIS — Z79899 Other long term (current) drug therapy: Secondary | ICD-10-CM | POA: Insufficient documentation

## 2020-09-08 LAB — COMPREHENSIVE METABOLIC PANEL
ALT: 16 U/L (ref 0–44)
AST: 16 U/L (ref 15–41)
Albumin: 4.8 g/dL (ref 3.5–5.0)
Alkaline Phosphatase: 112 U/L (ref 38–126)
Anion gap: 14 (ref 5–15)
BUN: 9 mg/dL (ref 6–20)
CO2: 24 mmol/L (ref 22–32)
Calcium: 9.8 mg/dL (ref 8.9–10.3)
Chloride: 101 mmol/L (ref 98–111)
Creatinine, Ser: 0.62 mg/dL (ref 0.44–1.00)
GFR, Estimated: >60 mL/min (ref >60)
Glucose, Bld: 271 mg/dL — ABNORMAL HIGH (ref 70–99)
Potassium: 3.6 mmol/L (ref 3.5–5.1)
Sodium: 139 mmol/L (ref 135–145)
Total Bilirubin: 0.9 mg/dL (ref 0.3–1.2)
Total Protein: 8.4 g/dL — ABNORMAL HIGH (ref 6.5–8.1)

## 2020-09-08 LAB — CBC
HCT: 48.4 % — ABNORMAL HIGH (ref 36.0–46.0)
Hemoglobin: 16.2 g/dL — ABNORMAL HIGH (ref 12.0–15.0)
MCH: 28.1 pg (ref 26.0–34.0)
MCHC: 33.5 g/dL (ref 30.0–36.0)
MCV: 84 fL (ref 80.0–100.0)
Platelets: 339 10*3/uL (ref 150–400)
RBC: 5.76 MIL/uL — ABNORMAL HIGH (ref 3.87–5.11)
RDW: 13.5 % (ref 11.5–15.5)
WBC: 10.9 10*3/uL — ABNORMAL HIGH (ref 4.0–10.5)
nRBC: 0 % (ref 0.0–0.2)

## 2020-09-08 LAB — URINALYSIS, ROUTINE W REFLEX MICROSCOPIC
Bilirubin Urine: NEGATIVE
Glucose, UA: >=500 mg/dL — AB
Hgb urine dipstick: NEGATIVE
Ketones, ur: 80 mg/dL — AB
Leukocytes,Ua: NEGATIVE
Nitrite: NEGATIVE
Protein, ur: NEGATIVE mg/dL
Specific Gravity, Urine: 1.014 (ref 1.005–1.030)
pH: 8 (ref 5.0–8.0)

## 2020-09-08 LAB — LIPASE, BLOOD: Lipase: 41 U/L (ref 11–51)

## 2020-09-08 MED ORDER — SODIUM CHLORIDE 0.9 % IV SOLN: INTRAVENOUS | Status: DC

## 2020-09-08 MED ORDER — ONDANSETRON HCL 4 MG/2ML IJ SOLN
4.0000 mg | Freq: Once | INTRAMUSCULAR | Status: AC
  Administered 2020-09-08: 4 mg via INTRAVENOUS
  Filled 2020-09-08: qty 2

## 2020-09-08 MED ORDER — HYDROMORPHONE HCL 1 MG/ML IJ SOLN
1.0000 mg | INTRAMUSCULAR | Status: AC | PRN
  Administered 2020-09-08 (×2): 1 mg via INTRAVENOUS
  Filled 2020-09-08 (×2): qty 1

## 2020-09-08 MED ORDER — SODIUM CHLORIDE 0.9 % IV BOLUS
1000.0000 mL | Freq: Once | INTRAVENOUS | Status: AC
  Administered 2020-09-08: 1000 mL via INTRAVENOUS

## 2020-09-08 NOTE — ED Provider Notes (Signed)
[facility] DEPT Provider Note   CSN: [id number] Arrival date & time: 09/08/20  0850     History Chief Complaint  Patient presents with  . Abdominal Pain  . Emesis  . Diarrhea    [name] is a 55 y.o. female.  HPI   Pt presents with upper abd pain and vomiting.  Pt states she has history of pancreatitis, this episode is very similar.  Pt is not sure what her pancreatitis is from.  She has had her gallbladder removed.  She does not drink alcohol.  The pain started yesterday.  The pain is in the upper abdomen and severe.  She has been vomiting, multiple times, 8-9 times.  She also started having a few loose stools.  No trouble with urinating.  No fevers.  Pt has had prior abdominal surgery.  Prior records reviewed.  Multiple previous visits with abd pain.  11 episodes last 6 months.  Last ed visit jan 5th.  Last CT scan December normal.  Past Medical History:  Diagnosis Date  . Anxiety   . Arthritis    rheumatoid , osteoarthritis  . Bronchitis   . Chronic back pain   . Depression   . Diabetes mellitus   . Fatty liver   . Fibromyalgia   . GERD (gastroesophageal reflux disease)   . Headache   . Hypertension   . Pancreatitis   . Pneumonia   . Rotator cuff tear, right     Patient Active Problem List   Diagnosis Date Noted  . Abdominal pain 11/23/2019  . Acute pancreatitis 11/21/2019  . Complete tear of right rotator cuff 07/22/2017    Class: Chronic  . Retained orthopedic hardware 07/22/2017    Class: Chronic  . Cervical disc herniation 07/21/2017    Class: Acute  . Status post cervical spinal fusion 07/21/2017  . Chronic back pain     Past Surgical History:  Procedure Laterality Date  . ABDOMINAL HYSTERECTOMY    . ANTERIOR CERVICAL DECOMP/DISCECTOMY FUSION N/A 07/21/2017   Procedure: ANTERIOR CERVICAL DISCECTOMY AND FUSION C6-7, REMOVAL OF SCREW C6 PLATE, DEPUY ZERO-P IMPLANT, LOCAL BONE GRAFT, ALLOGRAFT BONE GRAFT,  VIVIGEN;  Surgeon: Jessy Oto, MD;  Location: [facility];  Service: Orthopedics;  Laterality: N/A;  . BACK SURGERY     laminectomy  . BONE GRAFT HIP ILIAC CREST    . BREAST BIOPSY    . CARPAL TUNNEL RELEASE    . CERVICAL DISCECTOMY  07/21/2017  . CESAREAN SECTION    . CHOLECYSTECTOMY    . COLONOSCOPY    . frontal fusion    . neck fusion     patient reported  . SHOULDER ARTHROSCOPY WITH SUBACROMIAL DECOMPRESSION, ROTATOR CUFF REPAIR AND BICEP TENDON REPAIR Right 10/31/2017   Procedure: RIGHT SHOULDER ARTHROSCOPY, MANIPULATION UNDER ANESTHESIA, BICEPS TENODESIS, AND MINI OPEN ROTATOR CUFF TEAR REPAIR;  Surgeon: Meredith Pel, MD;  Location: [facility];  Service: Orthopedics;  Laterality: Right;     OB History   No obstetric history on file.     Family History  Problem Relation Age of Onset  . Hypertension Father   . Renal Disease Father   . Diabetes Mother   . Hypertension Mother   . Edema Mother   . Diabetes Sister   . Diabetes Brother     Social History   Tobacco Use  . Smoking status: Current Every Day Smoker    Packs/day: 0.50    Types: Cigarettes  . Smokeless  tobacco: Never Used  Vaping Use  . Vaping Use: Never used  Substance Use Topics  . Alcohol use: No  . Drug use: Yes    Types: Marijuana    Home Medications Prior to Admission medications   Medication Sig Start Date End Date Taking? Authorizing Provider  escitalopram (LEXAPRO) 10 MG tablet Take 10 mg by mouth daily as needed (depression). 05/30/20  Yes [provider]  metFORMIN (GLUCOPHAGE) 500 MG tablet Take 500 mg by mouth 2 (two) times daily. 07/29/17  Yes [provider]  metoprolol succinate (TOPROL-XL) 50 MG 24 hr tablet Take 50 mg by mouth daily. 07/21/14  Yes [provider]  omeprazole (PRILOSEC) 20 MG capsule Take 1 capsule (20 mg total) by mouth daily. 08/16/20  Yes Luna Fuse, MD  promethazine (PHENERGAN) 25 MG suppository Place 1 suppository (25 mg total) rectally  every 6 (six) hours as needed for nausea or vomiting. 08/15/20  Yes Wieters, Hallie C, PA-C  traMADol (ULTRAM) 50 MG tablet Take 25-50 mg by mouth 2 (two) times daily as needed for pain. 06/13/20  Yes [provider]  VIIBRYD 40 MG TABS Take 40 mg by mouth daily. 06/03/19  Yes [provider]  albuterol (PROVENTIL HFA;VENTOLIN HFA) 108 (90 BASE) MCG/ACT inhaler Inhale 2 puffs into the lungs every 4 (four) hours as needed for wheezing.    [provider]  albuterol (PROVENTIL) (2.5 MG/3ML) 0.083% nebulizer solution Take 3 mLs (2.5 mg total) by nebulization every 6 (six) hours as needed for wheezing or shortness of breath. 12/12/19   Tasia Catchings, Amy V, PA-C  ALPRAZolam Duanne Moron) 1 MG tablet Take 1 mg by mouth daily as needed for anxiety. 10/23/19   [provider]  AZOR 5-40 MG per tablet Take 1 tablet by mouth daily.  02/06/14   [provider]  cetirizine (ZYRTEC) 10 MG tablet Take 10 mg by mouth daily as needed for allergies. Patient not taking: Reported on 09/08/2020 04/18/20   [provider]  cyclobenzaprine (FLEXERIL) 10 MG tablet Take 10 mg by mouth 2 (two) times daily as needed for muscle spasms. 02/10/20   [provider]  dicyclomine (BENTYL) 10 MG capsule Take 10 mg by mouth 3 (three) times daily as needed for spasms.  05/05/20   [provider]  fluticasone (FLONASE) 50 MCG/ACT nasal spray Place 1 spray into both nostrils daily.    [provider]  insulin glargine (LANTUS) 100 unit/mL SOPN Inject 50 Units into the skin daily after lunch.    [provider]  pantoprazole (PROTONIX) 40 MG tablet Take 40 mg by mouth daily.    [provider]  promethazine (PHENERGAN) 25 MG tablet Take 1 tablet (25 mg total) by mouth every 6 (six) hours as needed for nausea or vomiting. Patient not taking: No sig reported 08/15/20   Wieters, Hallie C, PA-C  sucralfate (CARAFATE) 1 g tablet Take 1 tablet (1 g total) by mouth 4 (four)  times daily -  with meals and at bedtime. Patient not taking: No sig reported 04/24/20   Molpus, Jenny Reichmann, MD  tiZANidine (ZANAFLEX) 2 MG tablet Take 1 tablet (2 mg total) by mouth every 8 (eight) hours as needed for muscle spasms. Patient not taking: No sig reported 02/09/20   Cathlean Sauer V, PA-C  DEXILANT 60 MG capsule Take 1 capsule (60 mg total) by mouth daily. 01/25/20 04/24/20  Jacqlyn Larsen, PA-C    Allergies    Patient has no known allergies.  Review of Systems   Review of Systems  All other systems reviewed and are negative.   Physical Exam Updated Vital Signs BP (!) 181/100   Pulse 77   Temp (!) 97.5 F (36.4 C) (Oral)   Resp 18   Ht 1.626 m (5\' 4" )   Wt 89.8 kg   SpO2 99%   BMI 33.99 kg/m   Physical Exam Vitals and nursing note reviewed.  Constitutional:      Appearance: She is well-developed and well-nourished. She is ill-appearing.  HENT:     Head: Normocephalic and atraumatic.     Right Ear: External ear normal.     Left Ear: External ear normal.  Eyes:     General: No scleral icterus.       Right eye: No discharge.        Left eye: No discharge.     Conjunctiva/sclera: Conjunctivae normal.  Neck:     Trachea: No tracheal deviation.  Cardiovascular:     Rate and Rhythm: Normal rate and regular rhythm.     Pulses: Intact distal pulses.  Pulmonary:     Effort: Pulmonary effort is normal. No respiratory distress.     Breath sounds: Normal breath sounds. No stridor. No wheezing or rales.  Abdominal:     General: Bowel sounds are normal. There is no distension.     Palpations: Abdomen is soft.     Tenderness: There is abdominal tenderness in the epigastric area. There is guarding. There is no rebound.  Musculoskeletal:        General: No tenderness or edema.     Cervical back: Neck supple.  Skin:    General: Skin is warm and dry.     Findings: No rash.  Neurological:     Mental Status: She is alert.     Cranial Nerves: No cranial nerve deficit (no facial  droop, extraocular movements intact, no slurred speech).     Sensory: No sensory deficit.     Motor: No abnormal muscle tone or seizure activity.     Coordination: Coordination normal.     Deep Tendon Reflexes: Strength normal.  Psychiatric:        Mood and Affect: Mood and affect normal.     ED Results / Procedures / Treatments   Labs (all labs ordered are listed, but only abnormal results are displayed) Labs Reviewed  COMPREHENSIVE METABOLIC PANEL - Abnormal; Notable for the following components:      Result Value   Glucose, Bld 271 (*)    Total Protein 8.4 (*)    All other components within normal limits  CBC - Abnormal; Notable for the following components:   WBC 10.9 (*)    RBC 5.76 (*)    Hemoglobin 16.2 (*)    HCT 48.4 (*)    All other components within normal limits  URINALYSIS, ROUTINE W REFLEX MICROSCOPIC - Abnormal; Notable for the following components:   APPearance HAZY (*)    Glucose, UA >=500 (*)    Ketones, ur 80 (*)    Bacteria, UA RARE (*)    All other components within normal limits  LIPASE, BLOOD    EKG None  Radiology DG Abdomen Acute W/Chest  Result Date: 09/08/2020 CLINICAL DATA:  55 year old female with abdominal pain, nausea vomiting and diarrhea since yesterday. EXAM: DG ABDOMEN ACUTE WITH 1 VIEW CHEST COMPARISON:  CT Abdomen and Pelvis 07/12/2020 and earlier. FINDINGS: Stable lung volumes and mediastinal contours. Visualized tracheal air column is within normal  limits. No pneumothorax or pneumoperitoneum. Mildly increased pulmonary interstitial markings are stable since last year. Non obstructed bowel gas pattern. Stable abdominal visceral contours. Cholecystectomy and lumbar surgery related surgical clips again noted. Lumbar interbody implants. Prior cervical ACDF. No acute osseous abnormality identified. IMPRESSION: 1. Normal bowel gas pattern, no free air. 2. No acute cardiopulmonary abnormality. Electronically Signed   By: Genevie Ann M.D.   On:  09/08/2020 09:59    Procedures Procedures   Medications Ordered in ED Medications  sodium chloride 0.9 % bolus 1,000 mL (0 mLs Intravenous Stopped 09/08/20 1152)    And  0.9 %  sodium chloride infusion (has no administration in time range)  HYDROmorphone (DILAUDID) injection 1 mg (1 mg Intravenous Given 09/08/20 0955)  ondansetron (ZOFRAN) injection 4 mg (has no administration in time range)  ondansetron (ZOFRAN) injection 4 mg (4 mg Intravenous Given 09/08/20 0955)    ED Course  I have reviewed the triage vital signs and the nursing notes.  Pertinent labs & imaging results that were available during my care of the patient were reviewed by me and considered in my medical decision making (see chart for details).  Clinical Course as of 09/08/20 1216  Fri Sep 08, 2020  1139 Labs reviewed.  Lipase normal.  Metabolic panel normal [JK]  1140 Urinalysis without signs of infection [JK]  1140 Acute abdominal series without signs of obstruction [JK]    Clinical Course User Index [JK] Dorie Rank, MD   MDM Rules/Calculators/A&P                          Patient presented to ED for evaluation abdominal pain.  Patient states she has history of pancreatitis and was concerned about recurrent bouts.  Patient's medical records reviewed.  Patient sound recurrent episodes of abdominal pain bring her to the ED.  Most recent visits no clear etiology.  She did have a CT scan recently but did not show evidence of pancreatitis or other acute abnormality.  Patient states she had been seeing Dr. Collene Mares gastroenterology but then was referred to [facility].  Patient states she can no longer see Dr. Lemar Livings because she is seeing the doctors at [facility].  She is waiting for an appointment.  ED work-up reassuring.  No signs of acute obstruction, pancreatitis, infection today.  Stable for outpt management. Final Clinical Impression(s) / ED Diagnoses Final diagnoses:  Abdominal pain, unspecified abdominal location    Rx / DC  Orders ED Discharge Orders    None       Dorie Rank, MD 09/09/20 0700

## 2020-09-08 NOTE — ED Notes (Signed)
Pt informed that she has received IV narcotics and will be unable to drive for at least 4 hours.  Pt verbalized understanding and reports she will call someone to come pick her up.

## 2020-09-08 NOTE — Discharge Instructions (Addendum)
Follow-up with your GI doctor as planned.  Let them know you were seen in the emergency room to see if you could possibly be in sooner than originally scheduled.  Continue your current medications.  Return as needed for worsening symptoms

## 2020-09-08 NOTE — ED Notes (Signed)
Pt transported to xray 

## 2020-09-08 NOTE — ED Triage Notes (Signed)
Patient c/o abdominal pain, N/v/D  Since yesterday. Patient reports a history of pancreatitis.

## 2020-09-08 NOTE — ED Notes (Signed)
Pt ambulatory to bathroom, standby assistance.  

## 2020-10-19 ENCOUNTER — Emergency Department (HOSPITAL_COMMUNITY): Payer: Medicare Other

## 2020-10-19 ENCOUNTER — Emergency Department (HOSPITAL_COMMUNITY)
Admission: EM | Admit: 2020-10-19 | Discharge: 2020-10-19 | Disposition: A | Discharge status: Home / Self Care | Payer: Medicare Other | Attending: Emergency Medicine | Admitting: Emergency Medicine

## 2020-10-19 ENCOUNTER — Other Ambulatory Visit: Payer: Self-pay

## 2020-10-19 ENCOUNTER — Encounter (HOSPITAL_COMMUNITY): Payer: Self-pay

## 2020-10-19 DIAGNOSIS — Z7984 Long term (current) use of oral hypoglycemic drugs: Secondary | ICD-10-CM | POA: Insufficient documentation

## 2020-10-19 DIAGNOSIS — G8929 Other chronic pain: Secondary | ICD-10-CM | POA: Insufficient documentation

## 2020-10-19 DIAGNOSIS — R0602 Shortness of breath: Secondary | ICD-10-CM | POA: Insufficient documentation

## 2020-10-19 DIAGNOSIS — R112 Nausea with vomiting, unspecified: Secondary | ICD-10-CM

## 2020-10-19 DIAGNOSIS — Z794 Long term (current) use of insulin: Secondary | ICD-10-CM | POA: Diagnosis not present

## 2020-10-19 DIAGNOSIS — R1010 Upper abdominal pain, unspecified: Secondary | ICD-10-CM | POA: Diagnosis not present

## 2020-10-19 DIAGNOSIS — F17210 Nicotine dependence, cigarettes, uncomplicated: Secondary | ICD-10-CM | POA: Insufficient documentation

## 2020-10-19 DIAGNOSIS — K219 Gastro-esophageal reflux disease without esophagitis: Secondary | ICD-10-CM | POA: Insufficient documentation

## 2020-10-19 DIAGNOSIS — Z79899 Other long term (current) drug therapy: Secondary | ICD-10-CM | POA: Diagnosis not present

## 2020-10-19 DIAGNOSIS — E119 Type 2 diabetes mellitus without complications: Secondary | ICD-10-CM | POA: Insufficient documentation

## 2020-10-19 DIAGNOSIS — I10 Essential (primary) hypertension: Secondary | ICD-10-CM | POA: Diagnosis not present

## 2020-10-19 LAB — URINALYSIS, ROUTINE W REFLEX MICROSCOPIC
Bilirubin Urine: NEGATIVE
Glucose, UA: >=500 mg/dL — AB
Ketones, ur: 80 mg/dL — AB
Leukocytes,Ua: NEGATIVE
Nitrite: NEGATIVE
Protein, ur: NEGATIVE mg/dL
Specific Gravity, Urine: 1.025 (ref 1.005–1.030)
pH: 6 (ref 5.0–8.0)

## 2020-10-19 LAB — CBC
HCT: 48.2 % — ABNORMAL HIGH (ref 36.0–46.0)
Hemoglobin: 15.9 g/dL — ABNORMAL HIGH (ref 12.0–15.0)
MCH: 28.2 pg (ref 26.0–34.0)
MCHC: 33 g/dL (ref 30.0–36.0)
MCV: 85.5 fL (ref 80.0–100.0)
Platelets: 352 10*3/uL (ref 150–400)
RBC: 5.64 MIL/uL — ABNORMAL HIGH (ref 3.87–5.11)
RDW: 14 % (ref 11.5–15.5)
WBC: 11.8 10*3/uL — ABNORMAL HIGH (ref 4.0–10.5)
nRBC: 0 % (ref 0.0–0.2)

## 2020-10-19 LAB — COMPREHENSIVE METABOLIC PANEL
ALT: 19 U/L (ref 0–44)
AST: 18 U/L (ref 15–41)
Albumin: 4.6 g/dL (ref 3.5–5.0)
Alkaline Phosphatase: 115 U/L (ref 38–126)
Anion gap: 13 (ref 5–15)
BUN: 10 mg/dL (ref 6–20)
CO2: 23 mmol/L (ref 22–32)
Calcium: 9.7 mg/dL (ref 8.9–10.3)
Chloride: 101 mmol/L (ref 98–111)
Creatinine, Ser: 0.64 mg/dL (ref 0.44–1.00)
GFR, Estimated: >60 mL/min (ref >60)
Glucose, Bld: 297 mg/dL — ABNORMAL HIGH (ref 70–99)
Potassium: 4 mmol/L (ref 3.5–5.1)
Sodium: 137 mmol/L (ref 135–145)
Total Bilirubin: 0.8 mg/dL (ref 0.3–1.2)
Total Protein: 8 g/dL (ref 6.5–8.1)

## 2020-10-19 LAB — TROPONIN I (HIGH SENSITIVITY): Troponin I (High Sensitivity): 5 ng/L (ref <18)

## 2020-10-19 LAB — I-STAT BETA HCG BLOOD, ED (MC, WL, AP ONLY): I-stat hCG, quantitative: <5 m[IU]/mL (ref <5)

## 2020-10-19 LAB — LIPASE, BLOOD: Lipase: 34 U/L (ref 11–51)

## 2020-10-19 MED ORDER — LIDOCAINE VISCOUS HCL 2 % MT SOLN
15.0000 mL | Freq: Once | OROMUCOSAL | Status: AC
  Administered 2020-10-19: 15 mL via ORAL
  Filled 2020-10-19: qty 15

## 2020-10-19 MED ORDER — ONDANSETRON 4 MG PO TBDP: 4.0000 mg | ORAL_TABLET | Freq: Three times a day (TID) | ORAL | 0 refills | Status: DC | PRN

## 2020-10-19 MED ORDER — ALUM & MAG HYDROXIDE-SIMETH 200-200-20 MG/5ML PO SUSP
30.0000 mL | Freq: Once | ORAL | Status: AC
  Administered 2020-10-19: 30 mL via ORAL
  Filled 2020-10-19: qty 30

## 2020-10-19 MED ORDER — METOPROLOL TARTRATE 5 MG/5ML IV SOLN
5.0000 mg | Freq: Once | INTRAVENOUS | Status: AC
  Administered 2020-10-19: 5 mg via INTRAVENOUS
  Filled 2020-10-19: qty 5

## 2020-10-19 MED ORDER — ONDANSETRON HCL 4 MG/2ML IJ SOLN
4.0000 mg | Freq: Once | INTRAMUSCULAR | Status: AC
  Administered 2020-10-19: 4 mg via INTRAVENOUS
  Filled 2020-10-19: qty 2

## 2020-10-19 MED ORDER — SODIUM CHLORIDE 0.9 % IV BOLUS
1000.0000 mL | Freq: Once | INTRAVENOUS | Status: AC
  Administered 2020-10-19: 1000 mL via INTRAVENOUS

## 2020-10-19 MED ORDER — METOCLOPRAMIDE HCL 5 MG/ML IJ SOLN
10.0000 mg | Freq: Once | INTRAMUSCULAR | Status: AC
  Administered 2020-10-19: 10 mg via INTRAVENOUS
  Filled 2020-10-19: qty 2

## 2020-10-19 MED ORDER — IOHEXOL 350 MG/ML SOLN
100.0000 mL | Freq: Once | INTRAVENOUS | Status: AC | PRN
  Administered 2020-10-19: 100 mL via INTRAVENOUS

## 2020-10-19 MED ORDER — HYDROMORPHONE HCL 1 MG/ML IJ SOLN
0.5000 mg | Freq: Once | INTRAMUSCULAR | Status: AC
Start: 2020-10-19 — End: 2020-10-19
  Administered 2020-10-19: 0.5 mg via INTRAVENOUS
  Filled 2020-10-19: qty 1

## 2020-10-19 MED ORDER — HYDROMORPHONE HCL 1 MG/ML IJ SOLN
0.5000 mg | Freq: Once | INTRAMUSCULAR | Status: AC
  Administered 2020-10-19: 0.5 mg via INTRAVENOUS
  Filled 2020-10-19: qty 1

## 2020-10-19 MED ORDER — HALOPERIDOL LACTATE 5 MG/ML IJ SOLN
3.0000 mg | Freq: Once | INTRAMUSCULAR | Status: AC
  Administered 2020-10-19: 3 mg via INTRAVENOUS
  Filled 2020-10-19: qty 1

## 2020-10-19 MED ORDER — SODIUM CHLORIDE 0.9 % IV BOLUS
500.0000 mL | Freq: Once | INTRAVENOUS | Status: AC
  Administered 2020-10-19: 500 mL via INTRAVENOUS

## 2020-10-19 NOTE — ED Provider Notes (Signed)
Care assumed at shift change from [place], [place], pending trop and CT imaging. See their note for full HPI and workup. Briefly, pt presenting with acute on chronic abdominal pain.  Was noted to be tachycardic and hypertensive today thought to be due to missed dose of metoprolol she is not tolerating p.o. on arrival.  Plan to follow CT imaging and pontine level, if negative symptoms most likely acute on chronic abdominal pain, anticipate discharge to home.  Physical Exam  BP (!) 147/81   Pulse (!) 124   Temp 98.7 F (37.1 C) (Oral)   Resp (!) 24   SpO2 99%   Physical Exam Vitals and nursing note reviewed.  Constitutional:      General: She is not in acute distress.    Appearance: She is well-developed.  HENT:     Head: Normocephalic and atraumatic.  Eyes:     Conjunctiva/sclera: Conjunctivae normal.  Cardiovascular:     Rate and Rhythm: Normal rate.  Pulmonary:     Effort: Pulmonary effort is normal.  Neurological:     Mental Status: She is alert.  Psychiatric:        Mood and Affect: Mood normal.        Behavior: Behavior normal.    Results for orders placed or performed during the hospital encounter of 10/19/20  Lipase, blood  Result Value Ref Range   Lipase 34 11 - 51 U/L  Comprehensive metabolic panel  Result Value Ref Range   Sodium 137 135 - 145 mmol/L   Potassium 4.0 3.5 - 5.1 mmol/L   Chloride 101 98 - 111 mmol/L   CO2 23 22 - 32 mmol/L   Glucose, Bld 297 (H) 70 - 99 mg/dL   BUN 10 6 - 20 mg/dL   Creatinine, Ser 0.64 0.44 - 1.00 mg/dL   Calcium 9.7 8.9 - 10.3 mg/dL   Total Protein 8.0 6.5 - 8.1 g/dL   Albumin 4.6 3.5 - 5.0 g/dL   AST 18 15 - 41 U/L   ALT 19 0 - 44 U/L   Alkaline Phosphatase 115 38 - 126 U/L   Total Bilirubin 0.8 0.3 - 1.2 mg/dL   GFR, Estimated >60 >60 mL/min   Anion gap 13 5 - 15  CBC  Result Value Ref Range   WBC 11.8 (H) 4.0 - 10.5 K/uL   RBC 5.64 (H) 3.87 - 5.11 MIL/uL   Hemoglobin 15.9 (H) 12.0 - 15.0 g/dL   HCT 48.2 (H) 36.0 - 46.0  %   MCV 85.5 80.0 - 100.0 fL   MCH 28.2 26.0 - 34.0 pg   MCHC 33.0 30.0 - 36.0 g/dL   RDW 14.0 11.5 - 15.5 %   Platelets 352 150 - 400 K/uL   nRBC 0.0 0.0 - 0.2 %  Urinalysis, Routine w reflex microscopic Urine, Clean Catch  Result Value Ref Range   Color, Urine YELLOW YELLOW   APPearance HAZY (A) CLEAR   Specific Gravity, Urine 1.025 1.005 - 1.030   pH 6.0 5.0 - 8.0   Glucose, UA >=500 (A) NEGATIVE mg/dL   Hgb urine dipstick MODERATE (A) NEGATIVE   Bilirubin Urine NEGATIVE NEGATIVE   Ketones, ur 80 (A) NEGATIVE mg/dL   Protein, ur NEGATIVE NEGATIVE mg/dL   Nitrite NEGATIVE NEGATIVE   Leukocytes,Ua NEGATIVE NEGATIVE   RBC / HPF 11-20 0 - 5 RBC/hpf   WBC, UA 0-5 0 - 5 WBC/hpf   Bacteria, UA RARE (A) NONE SEEN   Squamous Epithelial / LPF  6-10 0 - 5   Mucus PRESENT   I-Stat beta hCG blood, ED  Result Value Ref Range   I-stat hCG, quantitative <5.0 <5 mIU/mL   Comment 3          Troponin I (High Sensitivity)  Result Value Ref Range   Troponin I (High Sensitivity) 5 <18 ng/L   CT Angio Chest PE W and/or Wo Contrast  Result Date: 10/19/2020 CLINICAL DATA:  Left upper quadrant abdominal pain. Shortness of breath. EXAM: CT ANGIOGRAPHY CHEST CT ABDOMEN AND PELVIS WITH CONTRAST TECHNIQUE: Multidetector CT imaging of the chest was performed using the standard protocol during bolus administration of intravenous contrast. Multiplanar CT image reconstructions and MIPs were obtained to evaluate the vascular anatomy. Multidetector CT imaging of the abdomen and pelvis was performed using the standard protocol during bolus administration of intravenous contrast. CONTRAST:  151mL OMNIPAQUE IOHEXOL 350 MG/ML SOLN COMPARISON:  July 12, 2020 FINDINGS: CTA CHEST FINDINGS Cardiovascular: Contrast injection is sufficient to demonstrate satisfactory opacification of the pulmonary arteries to the segmental level. There is no pulmonary embolus or evidence of right heart strain. The size of the main  pulmonary artery is normal. Moderate cardiomegaly. The course and caliber of the aorta are normal. There is mild atherosclerotic calcification. Opacification decreased due to pulmonary arterial phase contrast bolus timing. Mediastinum/Nodes: -- No mediastinal lymphadenopathy. -- No hilar lymphadenopathy. -- No axillary lymphadenopathy. -- No supraclavicular lymphadenopathy. -- Normal thyroid gland where visualized. -  Unremarkable esophagus. Lungs/Pleura: Atelectasis is noted at the lung bases. There is no pneumothorax or large pleural effusion. Musculoskeletal: No chest wall abnormality. No bony spinal canal stenosis. Review of the MIP images confirms the above findings. CT ABDOMEN and PELVIS FINDINGS Hepatobiliary: The liver is normal. Status post cholecystectomy.There is no biliary ductal dilation. Pancreas: Normal contours without ductal dilatation. No peripancreatic fluid collection. Spleen: Unremarkable. Adrenals/Urinary Tract: --Adrenal glands: There is a stable partially calcified left adrenal nodule. --Right kidney/ureter: No hydronephrosis or radiopaque kidney stones. --Left kidney/ureter: No hydronephrosis or radiopaque kidney stones. --Urinary bladder: Unremarkable. Stomach/Bowel: --Stomach/Duodenum: No hiatal hernia or other gastric abnormality. Normal duodenal course and caliber. --Small bowel: Unremarkable. --Colon: Unremarkable. --Appendix: Normal. Vascular/Lymphatic: Normal course and caliber of the major abdominal vessels. --No retroperitoneal lymphadenopathy. --No mesenteric lymphadenopathy. --No pelvic or inguinal lymphadenopathy. Reproductive: Unremarkable Other: No ascites or free air. Again noted is a small fat containing ventral wall hernia. Musculoskeletal. No acute displaced fractures. Review of the MIP images confirms the above findings. IMPRESSION: 1. No acute findings of the chest, abdomen or pelvis. 2. Moderate cardiomegaly. 3. Atelectasis at the lung bases. Aortic Atherosclerosis  (ICD10-I70.0). Electronically Signed   By: Constance Holster M.D.   On: 10/19/2020 18:59   CT ABDOMEN PELVIS W CONTRAST  Result Date: 10/19/2020 CLINICAL DATA:  Left upper quadrant abdominal pain. Shortness of breath. EXAM: CT ANGIOGRAPHY CHEST CT ABDOMEN AND PELVIS WITH CONTRAST TECHNIQUE: Multidetector CT imaging of the chest was performed using the standard protocol during bolus administration of intravenous contrast. Multiplanar CT image reconstructions and MIPs were obtained to evaluate the vascular anatomy. Multidetector CT imaging of the abdomen and pelvis was performed using the standard protocol during bolus administration of intravenous contrast. CONTRAST:  169mL OMNIPAQUE IOHEXOL 350 MG/ML SOLN COMPARISON:  July 12, 2020 FINDINGS: CTA CHEST FINDINGS Cardiovascular: Contrast injection is sufficient to demonstrate satisfactory opacification of the pulmonary arteries to the segmental level. There is no pulmonary embolus or evidence of right heart strain. The size of the main pulmonary artery is  normal. Moderate cardiomegaly. The course and caliber of the aorta are normal. There is mild atherosclerotic calcification. Opacification decreased due to pulmonary arterial phase contrast bolus timing. Mediastinum/Nodes: -- No mediastinal lymphadenopathy. -- No hilar lymphadenopathy. -- No axillary lymphadenopathy. -- No supraclavicular lymphadenopathy. -- Normal thyroid gland where visualized. -  Unremarkable esophagus. Lungs/Pleura: Atelectasis is noted at the lung bases. There is no pneumothorax or large pleural effusion. Musculoskeletal: No chest wall abnormality. No bony spinal canal stenosis. Review of the MIP images confirms the above findings. CT ABDOMEN and PELVIS FINDINGS Hepatobiliary: The liver is normal. Status post cholecystectomy.There is no biliary ductal dilation. Pancreas: Normal contours without ductal dilatation. No peripancreatic fluid collection. Spleen: Unremarkable. Adrenals/Urinary  Tract: --Adrenal glands: There is a stable partially calcified left adrenal nodule. --Right kidney/ureter: No hydronephrosis or radiopaque kidney stones. --Left kidney/ureter: No hydronephrosis or radiopaque kidney stones. --Urinary bladder: Unremarkable. Stomach/Bowel: --Stomach/Duodenum: No hiatal hernia or other gastric abnormality. Normal duodenal course and caliber. --Small bowel: Unremarkable. --Colon: Unremarkable. --Appendix: Normal. Vascular/Lymphatic: Normal course and caliber of the major abdominal vessels. --No retroperitoneal lymphadenopathy. --No mesenteric lymphadenopathy. --No pelvic or inguinal lymphadenopathy. Reproductive: Unremarkable Other: No ascites or free air. Again noted is a small fat containing ventral wall hernia. Musculoskeletal. No acute displaced fractures. Review of the MIP images confirms the above findings. IMPRESSION: 1. No acute findings of the chest, abdomen or pelvis. 2. Moderate cardiomegaly. 3. Atelectasis at the lung bases. Aortic Atherosclerosis (ICD10-I70.0). Electronically Signed   By: Constance Holster M.D.   On: 10/19/2020 18:59    ED Course/Procedures     Procedures  MDM  CT scans are negative, heart rate is improving. Trop is negative. On reevaluation, she reports improvement in symptoms.  Requests refill for Zofran, provided.  Patient is discharged in no distress.       Latoyia Tecson, [place] N, PA-C 10/19/20 1943    Lacretia Leigh, MD 10/23/20 1406

## 2020-10-19 NOTE — ED Notes (Signed)
Pt still having abd pain rated 10/10.

## 2020-10-19 NOTE — ED Triage Notes (Signed)
Pt BIB [facility] from home d/t abd pain rated 10/10 in her ULQ. Pt has Hx of pancreatitis, upon arrival to ED pt A/Ox4, verbal-able to make needs known & nauseated.

## 2020-10-19 NOTE — Discharge Instructions (Addendum)
Please read and follow all provided instructions.  Your diagnoses today include:  1. Chronic abdominal pain   2. Non-intractable vomiting with nausea, unspecified vomiting type     Tests performed today include: Blood cell counts and platelets Kidney and liver function tests - high blood sugar  Pancreas function test (called lipase) - no signs of pancreatitis Urine test to look for infection - shows some signs of dehydration Cardiac enzyme to check for stress on the heart Vital signs. See below for your results today.  CTA chest, CT abdomen/pelvis  Home care instructions:  Follow any educational materials contained in this packet.  Follow-up instructions: Please follow-up with your primary care provider in the next week for further evaluation of your symptoms.    Return instructions:  SEEK IMMEDIATE MEDICAL ATTENTION IF: The pain does not go away or becomes severe  A temperature above 101F develops  Repeated vomiting occurs (multiple episodes)  The pain becomes localized to portions of the abdomen. The right side could possibly be appendicitis. In an adult, the left lower portion of the abdomen could be colitis or diverticulitis.  Blood is being passed in stools or vomit (bright red or black tarry stools)  You develop chest pain, difficulty breathing, dizziness or fainting, or become confused, poorly responsive, or inconsolable (young children) If you have any other emergent concerns regarding your health  Additional Information: Abdominal (belly) pain can be caused by many things. Your caregiver performed an examination and possibly ordered blood/urine tests and imaging (CT scan, x-rays, ultrasound). Many cases can be observed and treated at home after initial evaluation in the emergency department. Even though you are being discharged home, abdominal pain can be unpredictable. Therefore, you need a repeated exam if your pain does not resolve, returns, or worsens. Most patients with  abdominal pain don't have to be admitted to the hospital or have surgery, but serious problems like appendicitis and gallbladder attacks can start out as nonspecific pain. Many abdominal conditions cannot be diagnosed in one visit, so follow-up evaluations are very important.  Your vital signs today were: BP (!) 132/91   Pulse (!) 103   Temp 98.7 F (37.1 C) (Oral)   Resp 20   SpO2 100%  If your blood pressure (bp) was elevated above 135/85 this visit, please have this repeated by your doctor within one month. --------------

## 2020-10-19 NOTE — ED Provider Notes (Signed)
[place] EMERGENCY DEPARTMENT Provider Note   CSN: [id number] Arrival date & time: 10/19/20  9937     History Chief Complaint  Patient presents with  . Abdominal Pain    [name] is a 55 y.o. female.  Patient with history of chronic abdominal pain, reported history of pancreatitis, endoscopy at [facility] in November 2021 which was negative --presents to the emergency department today for upper abdominal pain, worse on the left side, over the past 2 days.  She reports multiple episodes of vomiting, most recently just prior to arrival.  She reports soft, nonbloody and nonwatery stool.  No fevers, chest pain, shortness of breath.  Patient was transported by EMS.  She is requesting pain medication.  She denies alcohol use.  Previous cholecystectomy and hysterectomy.  Onset of symptoms acute.  Course is constant.  Nothing make symptoms better or worse.  She tried a suppository for nausea prior to arrival without improvement.  Of note, pt received 6 CT abdomen/pelvis between [place] in 2021 all without compelling explanation for the patient's pain.         Past Medical History:  Diagnosis Date  . Anxiety   . Arthritis    rheumatoid , osteoarthritis  . Bronchitis   . Chronic back pain   . Depression   . Diabetes mellitus   . Fatty liver   . Fibromyalgia   . GERD (gastroesophageal reflux disease)   . Headache   . Hypertension   . Pancreatitis   . Pneumonia   . Rotator cuff tear, right     Patient Active Problem List   Diagnosis Date Noted  . Abdominal pain 11/23/2019  . Acute pancreatitis 11/21/2019  . Complete tear of right rotator cuff 07/22/2017    Class: Chronic  . Retained orthopedic hardware 07/22/2017    Class: Chronic  . Cervical disc herniation 07/21/2017    Class: Acute  . Status post cervical spinal fusion 07/21/2017  . Chronic back pain     Past Surgical History:  Procedure Laterality Date  . ABDOMINAL  HYSTERECTOMY    . ANTERIOR CERVICAL DECOMP/DISCECTOMY FUSION N/A 07/21/2017   Procedure: ANTERIOR CERVICAL DISCECTOMY AND FUSION C6-7, REMOVAL OF SCREW C6 PLATE, DEPUY ZERO-P IMPLANT, LOCAL BONE GRAFT, ALLOGRAFT BONE GRAFT, VIVIGEN;  Surgeon: Jessy Oto, MD;  Location: [place];  Service: Orthopedics;  Laterality: N/A;  . BACK SURGERY     laminectomy  . BONE GRAFT HIP ILIAC CREST    . BREAST BIOPSY    . CARPAL TUNNEL RELEASE    . CERVICAL DISCECTOMY  07/21/2017  . CESAREAN SECTION    . CHOLECYSTECTOMY    . COLONOSCOPY    . frontal fusion    . neck fusion     patient reported  . SHOULDER ARTHROSCOPY WITH SUBACROMIAL DECOMPRESSION, ROTATOR CUFF REPAIR AND BICEP TENDON REPAIR Right 10/31/2017   Procedure: RIGHT SHOULDER ARTHROSCOPY, MANIPULATION UNDER ANESTHESIA, BICEPS TENODESIS, AND MINI OPEN ROTATOR CUFF TEAR REPAIR;  Surgeon: Meredith Pel, MD;  Location: [facility];  Service: Orthopedics;  Laterality: Right;     OB History   No obstetric history on file.     Family History  Problem Relation Age of Onset  . Hypertension Father   . Renal Disease Father   . Diabetes Mother   . Hypertension Mother   . Edema Mother   . Diabetes Sister   . Diabetes Brother     Social History   Tobacco Use  .  Smoking status: Current Every Day Smoker    Packs/day: 0.50    Types: Cigarettes  . Smokeless tobacco: Never Used  Vaping Use  . Vaping Use: Never used  Substance Use Topics  . Alcohol use: No  . Drug use: Yes    Types: Marijuana    Home Medications Prior to Admission medications   Medication Sig Start Date End Date Taking? Authorizing Provider  acetaminophen (TYLENOL) 500 MG tablet Take 500 mg by mouth 2 (two) times daily as needed for moderate pain.   Yes [provider]  albuterol (PROVENTIL HFA;VENTOLIN HFA) 108 (90 BASE) MCG/ACT inhaler Inhale 2 puffs into the lungs every 4 (four) hours as needed for wheezing.   Yes [provider]  albuterol (PROVENTIL)  (2.5 MG/3ML) 0.083% nebulizer solution Take 3 mLs (2.5 mg total) by nebulization every 6 (six) hours as needed for wheezing or shortness of breath. 12/12/19  Yes Yu, Amy V, PA-C  ALPRAZolam Duanne Moron) 1 MG tablet Take 1 mg by mouth daily as needed for anxiety. 10/23/19  Yes [provider]  cetirizine (ZYRTEC) 10 MG tablet Take 10 mg by mouth every evening. 09/27/20  Yes [provider]  cyclobenzaprine (FLEXERIL) 10 MG tablet Take 10 mg by mouth 2 (two) times daily as needed for muscle spasms. 02/10/20  Yes [provider]  DEXILANT 60 MG capsule Take 60 mg by mouth daily. 08/16/20  Yes [provider]  dicyclomine (BENTYL) 10 MG capsule Take 10 mg by mouth 3 (three) times daily as needed for spasms. 09/27/20  Yes [provider]  escitalopram (LEXAPRO) 20 MG tablet Take 20 mg by mouth daily as needed (depression). 08/10/20  Yes [provider]  fluticasone (FLONASE) 50 MCG/ACT nasal spray Place 1 spray into both nostrils daily as needed for allergies.   Yes [provider]  insulin glargine (LANTUS) 100 unit/mL SOPN Inject 50 Units into the skin daily at 2 PM.   Yes [provider]  JANUVIA 100 MG tablet Take 100 mg by mouth daily. 08/23/20  Yes [provider]  metFORMIN (GLUCOPHAGE) 500 MG tablet Take 500 mg by mouth 2 (two) times daily. 07/29/17  Yes [provider]  metoprolol succinate (TOPROL-XL) 50 MG 24 hr tablet Take 50 mg by mouth daily. 07/21/14  Yes [provider]  Multiple Vitamins-Minerals (CENTRUM PO) Take 1 tablet by mouth daily.   Yes [provider]  omeprazole (PRILOSEC) 20 MG capsule Take 1 capsule (20 mg total) by mouth daily. 08/16/20  Yes Luna Fuse, MD  ondansetron (ZOFRAN-ODT) 4 MG disintegrating tablet Take 4 mg by mouth every 8 (eight) hours as needed for nausea. 08/16/20  Yes [provider]  promethazine (PHENERGAN) 25 MG suppository Place 1 suppository (25 mg total)  rectally every 6 (six) hours as needed for nausea or vomiting. 08/15/20  Yes Wieters, Hallie C, PA-C  simvastatin (ZOCOR) 20 MG tablet Take 20 mg by mouth daily. 08/22/20  Yes [provider]  traMADol (ULTRAM) 50 MG tablet Take 25-50 mg by mouth 2 (two) times daily as needed for pain. 06/13/20  Yes [provider]  VIIBRYD 40 MG TABS Take 40 mg by mouth daily. 06/03/19  Yes [provider]  promethazine (PHENERGAN) 25 MG tablet Take 1 tablet (25 mg total) by mouth every 6 (six) hours as needed for nausea or vomiting. Patient not taking: No sig reported 08/15/20   Wieters, Hallie C, PA-C  sucralfate (CARAFATE) 1 g tablet Take 1 tablet (  1 g total) by mouth 4 (four) times daily -  with meals and at bedtime. Patient not taking: No sig reported 04/24/20   Molpus, Jenny Reichmann, MD  tiZANidine (ZANAFLEX) 2 MG tablet Take 1 tablet (2 mg total) by mouth every 8 (eight) hours as needed for muscle spasms. Patient not taking: No sig reported 02/09/20   Ok Edwards, PA-C    Allergies    Patient has no known allergies.  Review of Systems   Review of Systems  Constitutional: Negative for fever.  HENT: Negative for rhinorrhea and sore throat.   Eyes: Negative for redness.  Respiratory: Negative for cough and shortness of breath.   Cardiovascular: Negative for chest pain.  Gastrointestinal: Positive for abdominal pain, diarrhea, nausea and vomiting.  Genitourinary: Negative for dysuria, frequency, hematuria and urgency.  Musculoskeletal: Negative for myalgias.  Skin: Negative for rash.  Neurological: Negative for headaches.    Physical Exam Updated Vital Signs BP (!) 162/108 (BP Location: Right Arm)   Pulse (!) 103   Temp 98.7 F (37.1 C) (Oral)   Resp (!) 26   SpO2 96%   Physical Exam Vitals and nursing note reviewed.  Constitutional:      General: She is not in acute distress.    Appearance: She is well-developed.  HENT:     Head: Normocephalic and atraumatic.     Right Ear:  External ear normal.     Left Ear: External ear normal.     Nose: Nose normal.  Eyes:     Conjunctiva/sclera: Conjunctivae normal.  Cardiovascular:     Rate and Rhythm: Normal rate and regular rhythm.     Heart sounds: No murmur heard.   Pulmonary:     Effort: No respiratory distress.     Breath sounds: No wheezing, rhonchi or rales.  Abdominal:     Palpations: Abdomen is soft.     Tenderness: There is generalized abdominal tenderness. There is no guarding or rebound.     Comments: Patient yells and winces in pain when I push over any area of the abdomen.  She does report more severe pain over the left upper quadrant.  Musculoskeletal:     Cervical back: Normal range of motion and neck supple.     Right lower leg: No edema.     Left lower leg: No edema.  Skin:    General: Skin is warm and dry.     Findings: No rash.  Neurological:     General: No focal deficit present.     Mental Status: She is alert. Mental status is at baseline.     Motor: No weakness.  Psychiatric:        Mood and Affect: Mood normal.     ED Results / Procedures / Treatments   Labs (all labs ordered are listed, but only abnormal results are displayed) Labs Reviewed  COMPREHENSIVE METABOLIC PANEL - Abnormal; Notable for the following components:      Result Value   Glucose, Bld 297 (*)    All other components within normal limits  CBC - Abnormal; Notable for the following components:   WBC 11.8 (*)    RBC 5.64 (*)    Hemoglobin 15.9 (*)    HCT 48.2 (*)    All other components within normal limits  URINALYSIS, ROUTINE W REFLEX MICROSCOPIC - Abnormal; Notable for the following components:   APPearance HAZY (*)    Glucose, UA >=500 (*)    Hgb urine dipstick MODERATE (*)  Ketones, ur 80 (*)    Bacteria, UA RARE (*)    All other components within normal limits  LIPASE, BLOOD  I-STAT BETA HCG BLOOD, ED ([facility], [facility], [facility] ONLY)  TROPONIN I (HIGH SENSITIVITY)    EKG EKG  Interpretation  Date/Time:  Thursday October 19 2020 09:38:15 EST Ventricular Rate:  101 PR Interval:    QRS Duration: 88 QT Interval:  392 QTC Calculation: 509 R Axis:   40 Text Interpretation: Sinus tachycardia Consider right atrial enlargement Borderline prolonged QT interval No significant change since last tracing Confirmed by Gareth Morgan [phone]) on 10/19/2020 10:56:50 AM    Radiology No results found.  Procedures Procedures   Medications Ordered in ED Medications  metoprolol tartrate (LOPRESSOR) injection 5 mg (has no administration in time range)  HYDROmorphone (DILAUDID) injection 0.5 mg (0.5 mg Intravenous Given 10/19/20 1037)  ondansetron (ZOFRAN) injection 4 mg (4 mg Intravenous Given 10/19/20 1037)  sodium chloride 0.9 % bolus 500 mL (0 mLs Intravenous Stopped 10/19/20 1223)  sodium chloride 0.9 % bolus 1,000 mL (0 mLs Intravenous Stopped 10/19/20 1411)  metoCLOPramide (REGLAN) injection 10 mg (10 mg Intravenous Given 10/19/20 1404)  HYDROmorphone (DILAUDID) injection 0.5 mg (0.5 mg Intravenous Given 10/19/20 1407)  alum & mag hydroxide-simeth (MAALOX/MYLANTA) 200-200-20 MG/5ML suspension 30 mL (30 mLs Oral Given 10/19/20 1359)    And  lidocaine (XYLOCAINE) 2 % viscous mouth solution 15 mL (15 mLs Oral Given 10/19/20 1359)    ED Course  I have reviewed the triage vital signs and the nursing notes.  Pertinent labs & imaging results that were available during my care of the patient were reviewed by me and considered in my medical decision making (see chart for details).  Patient seen and examined. Work-up initiated. Medications ordered. Reviewed endoscopy from [place]. Her last CT was negative. She states next follow-up in early April.   Vital signs reviewed and are as follows: BP (!) 167/78   Pulse (!) 104   Temp 98.7 F (37.1 C) (Oral)   Resp (!) 24   SpO2 96%   12:16 PM Labs reviewed, overall unremarkable. Some signs of dehydration with 80 ketones in the urine and  elevated hgb. Anion gap and bicarbonate are normal with hyperglycemia -- doubt DKA for this reason. Will give additional hydration and PO challenge.    1:41 PM Pt rechecked.  Discussed reassuring lab work-up, signs of dehydration.  Pulse rate in the lower 110's.  She is receiving additional IV fluids.  Now c/o CP, she asks about her "heart rhythm".  I think this is likely related to her chronic abdominal pain but will check a troponin and repeat EKG.  Additional pain medications, Reglan, GI cocktail ordered.  Will reassess.  3:32 PM Troponin that was ordered earlier was unable to be run by lab, will need to be redrawn.  Patient is increasingly tachycardic.  She is on metoprolol chronically and has not had this since yesterday due to vomiting.  This is likely the cause.  Dose of IV metoprolol ordered.  Discussed with Dr. [name] who will see.  Signout to [employer] at shift change.   BP (!) 188/102   Pulse (!) 115   Temp 98.7 F (37.1 C) (Oral)   Resp (!) 21   SpO2 99%       MDM Rules/Calculators/A&P                          Pending completion of work-up.  So far, evaluation is reassuring and c/w acute on chronic abd pain.   Final Clinical Impression(s) / ED Diagnoses Final diagnoses:  Chronic abdominal pain  Non-intractable vomiting with nausea, unspecified vomiting type    Rx / DC Orders ED Discharge Orders    None       Carlisle Cater, PA-C 10/19/20 1537    Gareth Morgan, MD 10/20/20 [id number]    Gareth Morgan, MD 10/20/20 2235

## 2020-11-02 ENCOUNTER — Other Ambulatory Visit: Payer: Self-pay

## 2020-11-02 ENCOUNTER — Ambulatory Visit (INDEPENDENT_AMBULATORY_CARE_PROVIDER_SITE_OTHER): Payer: Medicare Other | Admitting: Podiatry

## 2020-11-02 ENCOUNTER — Ambulatory Visit (INDEPENDENT_AMBULATORY_CARE_PROVIDER_SITE_OTHER): Payer: Medicare Other

## 2020-11-02 DIAGNOSIS — M2041 Other hammer toe(s) (acquired), right foot: Secondary | ICD-10-CM

## 2020-11-02 DIAGNOSIS — M2042 Other hammer toe(s) (acquired), left foot: Secondary | ICD-10-CM | POA: Diagnosis not present

## 2020-11-02 DIAGNOSIS — M79671 Pain in right foot: Secondary | ICD-10-CM | POA: Diagnosis not present

## 2020-11-02 DIAGNOSIS — IMO0002 Reserved for concepts with insufficient information to code with codable children: Secondary | ICD-10-CM

## 2020-11-02 DIAGNOSIS — M79672 Pain in left foot: Secondary | ICD-10-CM

## 2020-11-02 DIAGNOSIS — E1142 Type 2 diabetes mellitus with diabetic polyneuropathy: Secondary | ICD-10-CM

## 2020-11-02 DIAGNOSIS — E1165 Type 2 diabetes mellitus with hyperglycemia: Secondary | ICD-10-CM

## 2020-11-02 NOTE — Progress Notes (Signed)
  Subjective:  Patient ID: [name], female    DOB: August 09, 1966,  MRN: [id number]  Chief Complaint  Patient presents with  . Foot Pain    Diabetic   PT stated that she is having pain in her feet it is a sharp pain     55 y.o. female presents with the above complaint. History confirmed with patient.  Complains of significant amounts of pins-and-needles feeling in her feet  Objective:  Physical Exam:  warm, good capillary refill, no trophic changes or ulcerative lesions and normal DP and PT pulses.  She has loss of protective sensation.  Her nails are elongated.   Radiographs: X-ray of both feet: no fracture, dislocation, swelling or degenerative changes noted Assessment:   1. Pain in both feet   2. 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  Patient educated on diabetes. Discussed proper diabetic foot care and discussed risks and complications of disease. Educated patient in depth on reasons to return to the office immediately should he/she discover anything concerning or new on the feet. All questions answered. Discussed proper shoes as well.   I discussed with her that the majority of the pain she is feeling is from diabetic peripheral neuropathy.  She should discuss with her primary care doctor regarding medical therapy and management of this.  This is not something that I treat primarily as it is not a primary podiatric issue it is a metabolic problem that she should work on lowering her A1c which is about 10% currently.  Return if symptoms worsen or fail to improve.

## 2020-11-02 NOTE — Patient Instructions (Signed)
Diabetic Neuropathy Diabetic neuropathy refers to nerve damage that is caused by diabetes. Over time, people with diabetes can develop nerve damage throughout the body. There are several types of diabetic neuropathy:  Peripheral neuropathy. This is the most common type of diabetic neuropathy. It damages the nerves that carry signals between the spinal cord and other parts of the body (peripheral nerves). This usually affects nerves in the feet, legs, hands, and arms.  Autonomic neuropathy. This type causes damage to nerves that control involuntary functions (autonomic nerves). Involuntary functions are functions of the body that you do not control. They include heartbeat, body temperature, blood pressure, urination, digestion, sweating, sexual function, or response to changes in blood glucose.  Focal neuropathy. This type of nerve damage affects one area of the body, such as an arm, a leg, or the face. The injury may involve one nerve or a small group of nerves. Focal neuropathy can be painful and unpredictable. It occurs most often in older adults with diabetes. This often develops suddenly, but usually improves over time and does not cause long-term problems.  Proximal neuropathy. This type of nerve damage affects the nerves of the thighs, hips, buttocks, or legs. It causes severe pain, weakness, and muscle death (atrophy), usually in the thigh muscles. It is more common among older men and people who have type 2 diabetes. The length of recovery time may vary. What are the causes? Peripheral, autonomic, and focal neuropathies are caused by diabetes that is not well controlled with treatment. The cause of proximal neuropathy is not known, but it may be caused by inflammation related to uncontrolled blood glucose levels. What are the signs or symptoms? Peripheral neuropathy Peripheral neuropathy develops slowly over time. When the nerves of the feet and legs no longer work, you may  experience:  Burning, stabbing, or aching pain in the legs or feet.  Pain or cramping in the legs or feet.  Loss of feeling (numbness) and inability to feel pressure or pain in the feet. This can lead to: ? Thick calluses or sores on areas of constant pressure. ? Ulcers. ? Reduced ability to feel temperature changes.  Foot deformities.  Muscle weakness.  Loss of balance or coordination. Autonomic neuropathy The symptoms of autonomic neuropathy vary depending on which nerves are affected. Symptoms may include:  Problems with digestion, such as: ? Nausea or vomiting. ? Poor appetite. ? Bloating. ? Diarrhea or constipation. ? Trouble swallowing. ? Losing weight without trying to.  Problems with the heart, blood, and lungs, such as: ? Dizziness, especially when standing up. ? Fainting. ? Shortness of breath. ? Irregular heartbeat.  Bladder problems, such as: ? Trouble starting or stopping urination. ? Leaking urine. ? Trouble emptying the bladder. ? Urinary tract infections (UTIs).  Problems with other body functions, such as: ? Sweat. You may sweat too much or too little. ? Temperature. You might get hot easily. Or, you might feel cold more than usual. ? Sexual function. Men may not be able to get or maintain an erection. Women may have vaginal dryness and difficulty with arousal. Focal neuropathy Symptoms affect only one area of the body. Common symptoms include:  Numbness.  Tingling.  Burning pain.  Prickling feeling.  Very sensitive skin.  Weakness.  Inability to move (paralysis).  Muscle twitching.  Muscles getting smaller (wasting).  Poor coordination.  Double or blurred vision. Proximal neuropathy  Sudden, severe pain in the hip, thigh, or buttocks. Pain may spread from the back into the legs (  sciatica).  Pain and numbness in the arms and legs.  Tingling.  Loss of bladder control or bowel control.  Weakness and wasting of thigh  muscles.  Difficulty getting up from a seated position.  Abdominal swelling.  Unexplained weight loss. How is this diagnosed? Diagnosis varies depending on the type of neuropathy your health care provider suspects. Peripheral neuropathy Your health care provider will do a neurologic exam. This exam checks your reflexes, how you move, and what you can feel. You may have other tests, such as:  Blood tests.  Tests of the fluid that surrounds the spinal cord (lumbar puncture).  CT scan.  MRI.  Checking the nerves that control muscles (electromyogram, or EMG).  Checking how quickly signals pass through your nerves (nerve conduction study).  Checking a small piece of a nerve using a microscope (biopsy). Autonomic neuropathy You may have tests, such as:  Tests to measure your blood pressure and heart rate. You may be secured to an exam table that moves you from a lying position to an upright position (table tilt test).  Breathing tests to check your lungs.  Tests to check how food moves through the digestive system (gastric emptying tests).  Blood, sweat, or urine tests.  Ultrasound of your bladder.  Spinal fluid tests. Focal neuropathy This condition may be diagnosed with:  A neurologic exam.  CT scan.  MRI.  EMG.  Nerve conduction study. Proximal neuropathy There is no test to diagnose this type of neuropathy. You may have tests to rule out other possible causes of this type of neuropathy. Tests may include:  X-rays of your spine and lumbar region.  Lumbar puncture.  MRI. How is this treated? The goal of treatment is to keep nerve damage from getting worse. Treatment may include:  Following your diabetes management plan. This will help keep your blood glucose level and your A1C level within your target range. This is the most important treatment.  Using prescription pain medicine. Follow these instructions at home: Diabetes management Follow your diabetes  management plan as told by your health care provider.  Check your blood glucose levels.  Keep your blood glucose in your target range.  Have your A1C level checked at least two times a year, or as often as told.  Take over the counter and prescription medicines only as told by your health care provider. This includes insulin and diabetes medicine.   Lifestyle  Do not use any products that contain nicotine or tobacco, such as cigarettes, e-cigarettes, and chewing tobacco. If you need help quitting, ask your health care provider.  Be physically active every day. Include strength training and balance exercises.  Follow a healthy meal plan.  Work with your health care provider to manage your blood pressure.   General instructions  Ask your health care provider if the medicine prescribed to you requires you to avoid driving or using machinery.  Check your skin and feet every day for cuts, bruises, redness, blisters, or sores.  Keep all follow-up visits. This is important. Contact a health care provider if:  You have burning, stabbing, or aching pain in your legs or feet.  You are unable to feel pressure or pain in your feet.  You develop problems with digestion, such as: ? Nausea. ? Vomiting. ? Bloating. ? Constipation. ? Diarrhea. ? Abdominal pain.  You have difficulty with urination, such as: ? Inability to control when you urinate (incontinence). ? Inability to completely empty the bladder (retention).  You feel   as if your heart is racing (palpitations).  You feel dizzy, weak, or faint when you stand up. Get help right away if:  You cannot urinate.  You have sudden weakness or loss of coordination.  You have trouble speaking.  You have pain or pressure in your chest.  You have an irregular heartbeat.  You have sudden inability to move a part of your body. These symptoms may represent a serious problem that is an emergency. Do not wait to see if the symptoms  will go away. Get medical help right away. Call your local emergency services (911 in the [place] Do not drive yourself to the hospital. Summary  Diabetic neuropathy is nerve damage that is caused by diabetes. It can cause numbness and pain in the arms, legs, digestive tract, heart, and other body systems.  This condition is treated by keeping your blood glucose level and your A1C level within your target range. This can help prevent neuropathy from getting worse.  Check your skin and feet every day for cuts, bruises, redness, blisters, or sores.  Do not use any products that contain nicotine or tobacco, such as cigarettes, e-cigarettes, and chewing tobacco. This information is not intended to replace advice given to you by your health care provider. Make sure you discuss any questions you have with your health care provider. Document Revised: 12/09/2019 Document Reviewed: 12/09/2019 Elsevier Patient Education  2021 [place].  Diabetes Mellitus and [place] care is an important part of your health, especially when you have diabetes. Diabetes may cause you to have problems because of poor blood flow (circulation) to your feet and legs, which can cause your skin to:  Become thinner and drier.  Break more easily.  Heal more slowly.  Peel and crack. You may also have nerve damage (neuropathy) in your legs and feet, causing decreased feeling in them. This means that you may not notice minor injuries to your feet that could lead to more serious problems. Noticing and addressing any potential problems early is the best way to prevent future foot problems. How to care for your feet Foot hygiene  Wash your feet daily with warm water and mild soap. Do not use hot water. Then, pat your feet and the areas between your toes until they are completely dry. Do not soak your feet as this can dry your skin.  Trim your toenails straight across. Do not dig under them or around the cuticle. File  the edges of your nails with an emery board or nail file.  Apply a moisturizing lotion or petroleum jelly to the skin on your feet and to dry, brittle toenails. Use lotion that does not contain alcohol and is unscented. Do not apply lotion between your toes.   Shoes and socks  Wear clean socks or stockings every day. Make sure they are not too tight. Do not wear knee-high stockings since they may decrease blood flow to your legs.  Wear shoes that fit properly and have enough cushioning. Always look in your shoes before you put them on to be sure there are no objects inside.  To break in new shoes, wear them for just a few hours a day. This prevents injuries on your feet. Wounds, scrapes, corns, and calluses  Check your feet daily for blisters, cuts, bruises, sores, and redness. If you cannot see the bottom of your feet, use a mirror or ask someone for help.  Do not cut corns or calluses or try to remove them  with medicine.  If you find a minor scrape, cut, or break in the skin on your feet, keep it and the skin around it clean and dry. You may clean these areas with mild soap and water. Do not clean the area with peroxide, alcohol, or iodine.  If you have a wound, scrape, corn, or callus on your foot, look at it several times a day to make sure it is healing and not infected. Check for: ? Redness, swelling, or pain. ? Fluid or blood. ? Warmth. ? Pus or a bad smell.   General tips  Do not cross your legs. This may decrease blood flow to your feet.  Do not use heating pads or hot water bottles on your feet. They may burn your skin. If you have lost feeling in your feet or legs, you may not know this is happening until it is too late.  Protect your feet from hot and cold by wearing shoes, such as at the beach or on hot pavement.  Schedule a complete foot exam at least once a year (annually) or more often if you have foot problems. Report any cuts, sores, or bruises to your health care  provider immediately. Where to find more information  American Diabetes Association: www.diabetes.org  Association of Diabetes Care & Education Specialists: www.diabeteseducator.org Contact a health care provider if:  You have a medical condition that increases your risk of infection and you have any cuts, sores, or bruises on your feet.  You have an injury that is not healing.  You have redness on your legs or feet.  You feel burning or tingling in your legs or feet.  You have pain or cramps in your legs and feet.  Your legs or feet are numb.  Your feet always feel cold.  You have pain around any toenails. Get help right away if:  You have a wound, scrape, corn, or callus on your foot and: ? You have pain, swelling, or redness that gets worse. ? You have fluid or blood coming from the wound, scrape, corn, or callus. ? Your wound, scrape, corn, or callus feels warm to the touch. ? You have pus or a bad smell coming from the wound, scrape, corn, or callus. ? You have a fever. ? You have a red line going up your leg. Summary  Check your feet every day for blisters, cuts, bruises, sores, and redness.  Apply a moisturizing lotion or petroleum jelly to the skin on your feet and to dry, brittle toenails.  Wear shoes that fit properly and have enough cushioning.  If you have foot problems, report any cuts, sores, or bruises to your health care provider immediately.  Schedule a complete foot exam at least once a year (annually) or more often if you have foot problems. This information is not intended to replace advice given to you by your health care provider. Make sure you discuss any questions you have with your health care provider. Document Revised: 02/17/2020 Document Reviewed: 02/17/2020 Elsevier Patient Education  [place].

## 2020-11-27 ENCOUNTER — Other Ambulatory Visit: Payer: Self-pay

## 2020-11-27 ENCOUNTER — Encounter: Payer: Self-pay | Admitting: Specialist

## 2020-11-27 ENCOUNTER — Ambulatory Visit: Payer: Self-pay

## 2020-11-27 ENCOUNTER — Ambulatory Visit (INDEPENDENT_AMBULATORY_CARE_PROVIDER_SITE_OTHER): Payer: Medicare Other | Admitting: Specialist

## 2020-11-27 VITALS — BP 177/79 | HR 92 | Ht 65.0 in | Wt 210.0 lb

## 2020-11-27 DIAGNOSIS — R202 Paresthesia of skin: Secondary | ICD-10-CM

## 2020-11-27 DIAGNOSIS — M4316 Spondylolisthesis, lumbar region: Secondary | ICD-10-CM

## 2020-11-27 DIAGNOSIS — M545 Low back pain, unspecified: Secondary | ICD-10-CM | POA: Diagnosis not present

## 2020-11-27 DIAGNOSIS — M4807 Spinal stenosis, lumbosacral region: Secondary | ICD-10-CM | POA: Diagnosis not present

## 2020-11-27 DIAGNOSIS — R200 Anesthesia of skin: Secondary | ICD-10-CM

## 2020-11-27 MED ORDER — TRAMADOL HCL 50 MG PO TABS: 25.0000 mg | ORAL_TABLET | Freq: Two times a day (BID) | ORAL | 0 refills | Status: DC | PRN

## 2020-11-27 MED ORDER — ALPRAZOLAM 0.25 MG PO TABS: ORAL_TABLET | ORAL | 0 refills | Status: DC

## 2020-11-27 MED ORDER — CYCLOBENZAPRINE HCL 10 MG PO TABS: 10.0000 mg | ORAL_TABLET | Freq: Two times a day (BID) | ORAL | 1 refills | Status: DC | PRN

## 2020-11-27 NOTE — Patient Instructions (Addendum)
  Plan:Avoid overhead lifting and overhead use of the arms. Do not lift greater than 5 lbs. Adjust head rest in vehicle to prevent hyperextension if rear ended. Take extra precautions to avoid falling. Schedule appointment with neurology for an evaluation and assessment of the left UE numbness and weakness, C7 distribution with normal reflexes. Avoid frequent bending and stooping  No lifting greater than 10 lbs. May use ice or moist heat for pain. Weight loss is of benefit. Best medication for lumbar disc disease is arthritis medications like meloxicam but you are intolerant of this.  Patients that have diabetes are at risk of developing kidney irritation or injury with prolong use or over use of arthritis medications. Exercise is important to improve your indurance and does allow people to function better inspite of back pain.  Cervical and lumbar myelogram and post myelogram CT scans.

## 2020-11-27 NOTE — Progress Notes (Signed)
Office Visit Note   [name]           Date of Birth: 04/25/1966           MRN: [id number] Visit Date: 11/27/2020              Requested by: Nolene Ebbs, MD [address] [place],  [place] [address] PCP: Nolene Ebbs, MD   Assessment & Plan: Visit Diagnoses:  1. Spinal stenosis of lumbosacral region   2. Low back pain, unspecified back pain laterality, unspecified chronicity, unspecified whether sciatica present     Plan:Avoid overhead lifting and overhead use of the arms. Do not lift greater than 5 lbs. Adjust head rest in vehicle to prevent hyperextension if rear ended. Take extra precautions to avoid falling. Schedule appointment with neurology for an evaluation and assessment of the left UE numbness and weakness, C7 distribution with normal reflexes. Avoid frequent bending and stooping  No lifting greater than 10 lbs. May use ice or moist heat for pain. Weight loss is of benefit. Best medication for lumbar disc disease is arthritis medications like meloxicam but you are intolerant of this.  Patients that have diabetes are at risk of developing kidney irritation or injury with prolong use or over use of arthritis medications. Exercise is important to improve your indurance and does allow people to function better inspite of back pain.  Cervical and lumbar myelogram and post myelogram CT scans.     Follow-Up Instructions: No follow-ups on file.   Orders:  Orders Placed This Encounter  Procedures  . XR Lumb Spine Flex&Ext Only   No orders of the defined types were placed in this encounter.     Procedures: No procedures performed   Clinical Data: No additional findings.   Subjective: Chief Complaint  Patient presents with  . Left Hand - Numbness    During the day and at night has numbness left middle and ring fingers   . Neck - Pain  . Right Shoulder - Pain  . Medication Refill    Tramadol and Flexeril     55 year old right  handed female post ACDF at the  C5-6 and C6-7, last surgery was extension of the fusion to the C6-7 level in 07/2017. She relates the neck and shoulders are painful and she is having difficulty with sleeping at night. Pain is into the shoulders and radiates down the left arm into the left long and ring fingers. No bowel or bladder difficulty. She is able to grocery shop. She has fallen twice and was not able to explain why she fell. Coming into the apartment and the other she was in the apartment and when she realized it she was on the floor. Last fell 4-5 months ago. Her arms feel weak but she isn't sure she is dropping items. She is also has low back pain and pain into the left leg. She reports she does more with the left side and thinks some may related to overcompensation. Pain in the back is sharp, dull or aching, today it is about a 7 of 10.  The pain is associated with numbness and tingling and she relates that her toes are numb. She has seen the foot specialist and has been told there is no neuropathy and the feet checked out good.She has pain with bending stooping and lifting. Pain with sitting for a while.     Review of Systems  Constitutional: Negative.   HENT: Negative.   Eyes:  Negative.   Respiratory: Positive for wheezing.   Cardiovascular: Negative.   Gastrointestinal: Negative.   Endocrine: Negative.   Genitourinary: Negative.   Musculoskeletal: Positive for back pain, neck pain and neck stiffness. Negative for gait problem.  Skin: Negative.   Allergic/Immunologic: Positive for environmental allergies.  Neurological: Positive for weakness and numbness. Negative for dizziness, tremors, seizures, syncope, facial asymmetry, speech difficulty, light-headedness and headaches.  Hematological: Negative.   Psychiatric/Behavioral: Negative.  Negative for agitation, behavioral problems, confusion, decreased concentration, dysphoric mood, hallucinations, self-injury, sleep disturbance and  suicidal ideas. The patient is not nervous/anxious and is not hyperactive.      Objective: Vital Signs: BP (!) 177/79   Pulse 92   Ht 5\' 5"  (1.651 m)   Wt 210 lb (95.3 kg)   BMI 34.95 kg/m   Physical Exam Constitutional:      Appearance: She is well-developed.  HENT:     Head: Normocephalic and atraumatic.  Eyes:     Pupils: Pupils are equal, round, and reactive to light.  Pulmonary:     Effort: Pulmonary effort is normal.     Breath sounds: Normal breath sounds.  Abdominal:     General: Bowel sounds are normal.     Palpations: Abdomen is soft.  Musculoskeletal:     Cervical back: Normal range of motion and neck supple.  Skin:    General: Skin is warm and dry.  Neurological:     Mental Status: She is alert and oriented to person, place, and time.  Psychiatric:        Behavior: Behavior normal.        Thought Content: Thought content normal.        Judgment: Judgment normal.     Back Exam   Tenderness  The patient is experiencing tenderness in the cervical.  Range of Motion  Extension: abnormal  Flexion: abnormal  Lateral bend right: abnormal  Lateral bend left: abnormal  Rotation right: abnormal  Rotation left: abnormal   Muscle Strength  Right Quadriceps:  5/5  Left Quadriceps:  4/5  Right Hamstrings:  4/5   Reflexes  Patellar: 2/4 Achilles: 0/4 Biceps: 2/4  Comments:  Weak left triceps, left wrist VF and finger extension.      Specialty Comments:  No specialty comments available.  Imaging: No results found.   PMFS History: Patient Active Problem List   Diagnosis Date Noted  . Complete tear of right rotator cuff 07/22/2017    Priority: High    Class: Chronic  . Cervical disc herniation 07/21/2017    Priority: High    Class: Acute  . Retained orthopedic hardware 07/22/2017    Priority: Medium    Class: Chronic  . Hypertension, essential 05/29/2020  . Abdominal pain 11/23/2019  . Acute pancreatitis 11/21/2019  . De Quervain's  syndrome (tenosynovitis) 07/06/2018  . 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 Status post cervical spinal fusion 07/21/17 07/21/2017  . Chronic back pain    Past Medical History:  Diagnosis Date  . Anxiety   . Arthritis    rheumatoid , osteoarthritis  . Bronchitis   . Chronic back pain   . Depression   . Diabetes mellitus   . Fatty liver   . Fibromyalgia   . GERD (gastroesophageal reflux disease)   . Headache   . Hypertension   . Pancreatitis   . Pneumonia   . Rotator cuff tear, right     Family History  Problem Relation Age of Onset  . Hypertension Father   .  Renal Disease Father   . Diabetes Mother   . Hypertension Mother   . Edema Mother   . Diabetes Sister   . Diabetes Brother     Past Surgical History:  Procedure Laterality Date  . ABDOMINAL HYSTERECTOMY    . ANTERIOR CERVICAL DECOMP/DISCECTOMY FUSION N/A 07/21/2017   Procedure: ANTERIOR CERVICAL DISCECTOMY AND FUSION C6-7, REMOVAL OF SCREW C6 PLATE, DEPUY ZERO-P IMPLANT, LOCAL BONE GRAFT, ALLOGRAFT BONE GRAFT, VIVIGEN;  Surgeon: Jessy Oto, MD;  Location: [facility];  Service: Orthopedics;  Laterality: N/A;  . BACK SURGERY     laminectomy  . BONE GRAFT HIP ILIAC CREST    . BREAST BIOPSY    . CARPAL TUNNEL RELEASE    . CERVICAL DISCECTOMY  07/21/2017  . CESAREAN SECTION    . CHOLECYSTECTOMY    . COLONOSCOPY    . frontal fusion    . neck fusion     patient reported  . SHOULDER ARTHROSCOPY WITH SUBACROMIAL DECOMPRESSION, ROTATOR CUFF REPAIR AND BICEP TENDON REPAIR Right 10/31/2017   Procedure: RIGHT SHOULDER ARTHROSCOPY, MANIPULATION UNDER ANESTHESIA, BICEPS TENODESIS, AND MINI OPEN ROTATOR CUFF TEAR REPAIR;  Surgeon: Meredith Pel, MD;  Location: [facility];  Service: Orthopedics;  Laterality: Right;   Social History   Occupational History  . Not on file  Tobacco Use  . Smoking status: Current Every Day Smoker    Packs/day: 0.50    Types: Cigarettes  . Smokeless tobacco: Never Used  Vaping Use  . Vaping Use:  Never used  Substance and Sexual Activity  . Alcohol use: No  . Drug use: Yes    Types: Marijuana  . Sexual activity: Not on file

## 2020-11-28 ENCOUNTER — Other Ambulatory Visit: Payer: Self-pay | Admitting: Specialist

## 2020-11-28 DIAGNOSIS — R202 Paresthesia of skin: Secondary | ICD-10-CM

## 2020-11-28 DIAGNOSIS — M4807 Spinal stenosis, lumbosacral region: Secondary | ICD-10-CM

## 2020-11-28 DIAGNOSIS — M4316 Spondylolisthesis, lumbar region: Secondary | ICD-10-CM

## 2020-11-28 DIAGNOSIS — M545 Low back pain, unspecified: Secondary | ICD-10-CM

## 2020-11-28 DIAGNOSIS — R200 Anesthesia of skin: Secondary | ICD-10-CM

## 2020-12-01 ENCOUNTER — Telehealth: Payer: Self-pay

## 2020-12-01 NOTE — Telephone Encounter (Signed)
Phone call to patient to verify medication list and allergies for myelogram procedure. Pt instructed to hold Lexapro, Tramadol, and Viibryd for 48hrs prior to myelogram appointment time and 24 hours after appointment. Pt also instructed to have a driver the day of the procedure, the procedure would take around 2 hours, and discharge instructions discussed. Pt verbalized understanding.

## 2020-12-05 ENCOUNTER — Telehealth: Payer: Self-pay

## 2020-12-05 NOTE — Telephone Encounter (Signed)
Jennifer from GI called regarding orders that was sent in from pt.  She would like to know if we were doing both lumbar and cervical Images.  If so we need the cervical to be sent in so they can do both at the same time.   [place] # [phone]

## 2020-12-06 ENCOUNTER — Other Ambulatory Visit: Payer: Self-pay | Admitting: Specialist

## 2020-12-06 ENCOUNTER — Ambulatory Visit
Admission: RE | Admit: 2020-12-06 | Discharge: 2020-12-06 | Disposition: A | Discharge status: Home / Self Care | Payer: Medicare Other | Source: Ambulatory Visit | Attending: Specialist | Admitting: Specialist

## 2020-12-06 DIAGNOSIS — M4316 Spondylolisthesis, lumbar region: Secondary | ICD-10-CM

## 2020-12-06 DIAGNOSIS — R200 Anesthesia of skin: Secondary | ICD-10-CM

## 2020-12-06 DIAGNOSIS — M542 Cervicalgia: Secondary | ICD-10-CM

## 2020-12-06 DIAGNOSIS — M4807 Spinal stenosis, lumbosacral region: Secondary | ICD-10-CM

## 2020-12-06 DIAGNOSIS — M545 Low back pain, unspecified: Secondary | ICD-10-CM

## 2020-12-06 MED ORDER — ONDANSETRON HCL 4 MG/2ML IJ SOLN
4.0000 mg | Freq: Once | INTRAMUSCULAR | Status: AC
  Administered 2020-12-06: 4 mg via INTRAMUSCULAR

## 2020-12-06 MED ORDER — MEPERIDINE HCL 50 MG/ML IJ SOLN
50.0000 mg | Freq: Once | INTRAMUSCULAR | Status: AC
  Administered 2020-12-06: 50 mg via INTRAMUSCULAR

## 2020-12-06 MED ORDER — IOPAMIDOL (ISOVUE-M 300) INJECTION 61%: 10.0000 mL | Freq: Once | INTRAMUSCULAR | Status: DC | PRN

## 2020-12-06 MED ORDER — DIAZEPAM 5 MG PO TABS
10.0000 mg | ORAL_TABLET | Freq: Once | ORAL | Status: AC
  Administered 2020-12-06: 10 mg via ORAL

## 2020-12-06 NOTE — Telephone Encounter (Signed)
Sabrina took care of this 

## 2020-12-06 NOTE — Discharge Instr - Other Orders (Addendum)
1330: pt c/o BLE and lower back pain from the myelogram procedure 10/10. Pt still having images taken in xray room. See MAR.  5809: Pt in nursing recovery area after myelogram procedure. Pt reports pain is a little better at 6/10. Offered repositioning and pillows to aid in comfort.

## 2020-12-06 NOTE — Progress Notes (Signed)
Pt reports she has been off of her Lexapro, Tramadol, and Viibryd for at least 48 hours.

## 2020-12-06 NOTE — Discharge Instructions (Signed)
Myelogram Discharge Instructions  1. Go home and rest quietly as needed. You may resume normal activities; however, do not exert yourself strongly or do any heavy lifting today and tomorrow.   2. DO NOT drive today.    3. You may resume your normal diet and medications unless otherwise indicated. Drink lots of extra fluids today and tomorrow.   4. The incidence of headache, nausea, or vomiting is about 5% (one in 20 patients).  If you develop a headache, lie flat for 24 hours and drink plenty of fluids until the headache goes away.  Caffeinated beverages may be helpful. If when you get up you still have a headache when standing, go back to bed and force fluids for another 24 hours.   5. If you develop severe nausea and vomiting or a headache that does not go away with the flat bedrest after 48 hours, please call [phone].   6. Call your physician for a follow-up appointment.  The results of your myelogram will be sent directly to your physician by the following day.  7. If you have any questions or if complications develop after you arrive home, please call [phone].  Discharge instructions have been explained to the patient.  The patient, or the person responsible for the patient, fully understands these instructions.   Thank you for visiting our office today.  YOU MAY RESUME YOUR LEXAPRO, TRAMADOL, AND VIIBRYD TOMORROW 12/07/20 AT 1:00 PM

## 2020-12-21 ENCOUNTER — Other Ambulatory Visit: Payer: Self-pay | Admitting: Internal Medicine

## 2020-12-22 LAB — COMPLETE METABOLIC PANEL WITH GFR
AG Ratio: 1.7 (calc) (ref 1.0–2.5)
ALT: 14 U/L (ref 6–29)
AST: 14 U/L (ref 10–35)
Albumin: 4.7 g/dL (ref 3.6–5.1)
Alkaline phosphatase (APISO): 122 U/L (ref 37–153)
BUN: 15 mg/dL (ref 7–25)
CO2: 23 mmol/L (ref 20–32)
Calcium: 9.7 mg/dL (ref 8.6–10.4)
Chloride: 101 mmol/L (ref 98–110)
Creat: 0.76 mg/dL (ref 0.50–1.05)
GFR, Est African American: 102 mL/min/{1.73_m2} (ref >=60)
GFR, Est Non African American: 88 mL/min/{1.73_m2} (ref >=60)
Globulin: 2.7 g/dL (calc) (ref 1.9–3.7)
Glucose, Bld: 172 mg/dL — ABNORMAL HIGH (ref 65–99)
Potassium: 4.3 mmol/L (ref 3.5–5.3)
Sodium: 139 mmol/L (ref 135–146)
Total Bilirubin: 0.3 mg/dL (ref 0.2–1.2)
Total Protein: 7.4 g/dL (ref 6.1–8.1)

## 2020-12-22 LAB — CBC
HCT: 45.6 % — ABNORMAL HIGH (ref 35.0–45.0)
Hemoglobin: 15 g/dL (ref 11.7–15.5)
MCH: 27.6 pg (ref 27.0–33.0)
MCHC: 32.9 g/dL (ref 32.0–36.0)
MCV: 83.8 fL (ref 80.0–100.0)
MPV: 10.5 fL (ref 7.5–12.5)
Platelets: 338 10*3/uL (ref 140–400)
RBC: 5.44 10*6/uL — ABNORMAL HIGH (ref 3.80–5.10)
RDW: 13.4 % (ref 11.0–15.0)
WBC: 9.4 10*3/uL (ref 3.8–10.8)

## 2020-12-22 LAB — LIPID PANEL
Cholesterol: 169 mg/dL (ref <200)
HDL: 51 mg/dL (ref >=50)
LDL Cholesterol (Calc): 87 mg/dL (calc)
Non-HDL Cholesterol (Calc): 118 mg/dL (calc) (ref <130)
Total CHOL/HDL Ratio: 3.3 (calc) (ref <5.0)
Triglycerides: 213 mg/dL — ABNORMAL HIGH (ref <150)

## 2020-12-22 LAB — TSH: TSH: 0.65 mIU/L

## 2020-12-22 LAB — VITAMIN D 25 HYDROXY (VIT D DEFICIENCY, FRACTURES): Vit D, 25-Hydroxy: 21 ng/mL — ABNORMAL LOW (ref 30–100)

## 2021-02-20 ENCOUNTER — Encounter: Payer: Self-pay | Admitting: Neurology

## 2021-02-20 ENCOUNTER — Ambulatory Visit (INDEPENDENT_AMBULATORY_CARE_PROVIDER_SITE_OTHER): Payer: Medicare Other | Admitting: Neurology

## 2021-02-20 ENCOUNTER — Other Ambulatory Visit: Payer: Self-pay

## 2021-02-20 VITALS — BP 147/93 | HR 106 | Ht 64.0 in | Wt 207.0 lb

## 2021-02-20 DIAGNOSIS — G5601 Carpal tunnel syndrome, right upper limb: Secondary | ICD-10-CM | POA: Diagnosis not present

## 2021-02-20 DIAGNOSIS — R200 Anesthesia of skin: Secondary | ICD-10-CM

## 2021-02-20 DIAGNOSIS — Z981 Arthrodesis status: Secondary | ICD-10-CM

## 2021-02-20 DIAGNOSIS — M79602 Pain in left arm: Secondary | ICD-10-CM | POA: Diagnosis not present

## 2021-02-20 MED ORDER — PREGABALIN 75 MG PO CAPS: 75.0000 mg | ORAL_CAPSULE | Freq: Two times a day (BID) | ORAL | 3 refills | Status: DC

## 2021-02-20 NOTE — Progress Notes (Signed)
[facility]  [name] DOB: 1966-07-26  REFERRING DOCTOR OR PCP: Dr. Louanne Skye (Ortho); Nolene Ebbs, MD (PCP) SOURCE: Patient, notes from primary care, CT/MRI imaging reports and images personally reviewed   _________________________________   HISTORICAL  CHIEF COMPLAINT:  Chief Complaint  Patient presents with   New Patient (Initial Visit)    RM 1, presents today with numbness and tingling in the left hand specifically middle and ring finger that is consistent. She states that she has struggled a long time with DJD and has had neck and back surgeries. She complains of pain generalized that has been present for some time. She states she had completed nerve conduction study and it was ordered by Dr Louanne Skye.     HISTORY OF PRESENT ILLNESS:  I had the pleasure seeing your patient, [name], at [facility] for neurologic consultation regarding her numbness and tingling in the left hand and other issues.  She is a 55 year old woman with numbness in her left arm going into the third and fourth fingers x many years.   She has pain in the same distribution as the numbness.  She notes a reduced grip and arm strength.  She had  a cervical fusion at C6-C7 in 2019 (Dr. Louanne Skye).  She had previously had an ACDF at C5-C6 many years ago.   She did not feel the surgery helped her numbness any.   Recent CT myelogram did not show any significant foraminal narrowing.  There is apparent pseudoarthrosis at C6-C7.  She reports her gait is poor but she also has significant degenerative changes in her lumbar spine and has had interbody fusion at L4-L5 and L5-S1.  She has significant degenerative changes at L3-L4.   Bladder is fine.    She is on cyclobenzaprine but does not feel it is helping her any.  She is also on tramadol.  Imaging studies personally reviewed today: MRI of the cervical spine 06/26/2017 showed remote C5-C6 ACDF.  There was a disc  extrusion at C6-C7 with inferior migration causing spinal stenosis.  She had milder degenerative changes at other levels.  CT myelogram 12/06/2020 CERVICAL SPINE:   Prior C5-C7 ACDF with pseudoarthrosis and screw loosening at C6-C7.  Unchanged mild neuroforaminal stenosis at C4-C5 and C6-C7.   LUMBAR SPINE: Prior L4-L5 and L5-S1 interbody fusion with solid arthrodesis.  New moderate left neuroforaminal stenosis at L4-L5 due to progressive facet hypertrophy.   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   REVIEW OF SYSTEMS: Constitutional: No fevers, chills, sweats, or change in appetite Eyes: No visual changes, double vision, eye pain Ear, nose and throat: No hearing loss, ear pain, nasal congestion, sore throat Cardiovascular: No chest pain, palpitations Respiratory:  No shortness of breath at rest or with exertion.   No wheezes GastrointestinaI: No nausea, vomiting, diarrhea, abdominal pain, fecal incontinence Genitourinary:  No dysuria, urinary retention or frequency.  No nocturia. Musculoskeletal:  No neck pain, back pain Integumentary: No rash, pruritus, skin lesions Neurological: as above Psychiatric: No depression at this time.  No anxiety Endocrine: No palpitations, diaphoresis, change in appetite, change in weigh or increased thirst Hematologic/Lymphatic:  No anemia, purpura, petechiae. Allergic/Immunologic: No itchy/runny eyes, nasal congestion, recent allergic reactions, rashes  ALLERGIES: No Known Allergies  HOME MEDICATIONS:  Current Outpatient Medications:    acetaminophen (TYLENOL) 500 MG tablet, Take 500 mg by mouth 2 (two) times daily as needed for moderate  pain., Disp: , Rfl:    albuterol (PROVENTIL HFA;VENTOLIN HFA) 108 (90 BASE) MCG/ACT inhaler, Inhale 2 puffs into the lungs every 4 (four) hours as needed for wheezing., Disp: , Rfl:    albuterol (PROVENTIL) (2.5 MG/3ML) 0.083%  nebulizer solution, Take 3 mLs (2.5 mg total) by nebulization every 6 (six) hours as needed for wheezing or shortness of breath., Disp: 75 mL, Rfl: 0   ALPRAZolam (XANAX) 1 MG tablet, Take 1 mg by mouth daily as needed for anxiety., Disp: , Rfl:    cetirizine (ZYRTEC) 10 MG tablet, Take 10 mg by mouth every evening., Disp: , Rfl:    cyclobenzaprine (FLEXERIL) 10 MG tablet, Take 1 tablet (10 mg total) by mouth 2 (two) times daily as needed for muscle spasms., Disp: 30 tablet, Rfl: 1   DEXILANT 60 MG capsule, Take 60 mg by mouth daily., Disp: , Rfl:    dicyclomine (BENTYL) 10 MG capsule, Take 10 mg by mouth 3 (three) times daily as needed for spasms., Disp: , Rfl:    escitalopram (LEXAPRO) 20 MG tablet, Take 20 mg by mouth daily as needed (depression)., Disp: , Rfl:    fluticasone (FLONASE) 50 MCG/ACT nasal spray, Place 1 spray into both nostrils daily as needed for allergies., Disp: , Rfl:    insulin glargine (LANTUS) 100 unit/mL SOPN, Inject 50 Units into the skin daily at 2 PM., Disp: , Rfl:    JANUVIA 100 MG tablet, Take 100 mg by mouth daily., Disp: , Rfl:    metFORMIN (GLUCOPHAGE) 500 MG tablet, Take 500 mg by mouth 2 (two) times daily., Disp: , Rfl: 0   metoprolol succinate (TOPROL-XL) 50 MG 24 hr tablet, Take 50 mg by mouth daily., Disp: , Rfl: 0   Multiple Vitamins-Minerals (CENTRUM PO), Take 1 tablet by mouth daily., Disp: , Rfl:    omeprazole (PRILOSEC) 20 MG capsule, Take 1 capsule (20 mg total) by mouth daily., Disp: 20 capsule, Rfl: 0   ondansetron (ZOFRAN-ODT) 4 MG disintegrating tablet, Take 1 tablet (4 mg total) by mouth every 8 (eight) hours as needed for nausea., Disp: 20 tablet, Rfl: 0   simvastatin (ZOCOR) 20 MG tablet, Take 20 mg by mouth daily., Disp: , Rfl:    traMADol (ULTRAM) 50 MG tablet, Take 0.5-1 tablets (25-50 mg total) by mouth 2 (two) times daily as needed., Disp: 30 tablet, Rfl: 0   VIIBRYD 40 MG TABS, Take 40 mg by mouth daily., Disp: , Rfl:   PAST MEDICAL  HISTORY: Past Medical History:  Diagnosis Date   Anxiety    Arthritis    rheumatoid , osteoarthritis   Bronchitis    Chronic back pain    Depression    Diabetes mellitus    Fatty liver    Fibromyalgia    GERD (gastroesophageal reflux disease)    Headache    Hypertension    Pancreatitis    Pneumonia    Rotator cuff tear, right     PAST SURGICAL HISTORY: Past Surgical History:  Procedure Laterality Date   ABDOMINAL HYSTERECTOMY     ANTERIOR CERVICAL DECOMP/DISCECTOMY FUSION N/A 07/21/2017   Procedure: ANTERIOR CERVICAL DISCECTOMY AND FUSION C6-7, REMOVAL OF SCREW C6 PLATE, DEPUY ZERO-P IMPLANT, LOCAL BONE GRAFT, ALLOGRAFT BONE GRAFT, VIVIGEN;  Surgeon: Jessy Oto, MD;  Location: [facility] [place];  Service: Orthopedics;  Laterality: N/A;   BACK SURGERY     laminectomy   BONE GRAFT HIP ILIAC CREST     BREAST BIOPSY     CARPAL TUNNEL  RELEASE     CERVICAL DISCECTOMY  07/21/2017   CESAREAN SECTION     CHOLECYSTECTOMY     COLONOSCOPY     frontal fusion     neck fusion     patient reported   SHOULDER ARTHROSCOPY WITH SUBACROMIAL DECOMPRESSION, ROTATOR CUFF REPAIR AND BICEP TENDON REPAIR Right 10/31/2017   Procedure: RIGHT SHOULDER ARTHROSCOPY, MANIPULATION UNDER ANESTHESIA, BICEPS TENODESIS, AND MINI OPEN ROTATOR CUFF TEAR REPAIR;  Surgeon: Meredith Pel, MD;  Location: [place];  Service: Orthopedics;  Laterality: Right;    FAMILY HISTORY: Family History  Problem Relation Age of Onset   Hypertension Father    Renal Disease Father    Diabetes Mother    Hypertension Mother    Edema Mother    Diabetes Sister    Diabetes Brother     SOCIAL HISTORY:  Social History   Socioeconomic History   Marital status: Legally Separated    Spouse name: Not on file   Number of children: Not on file   Years of education: Not on file   Highest education level: Not on file  Occupational History   Not on file  Tobacco Use   Smoking status: Every Day    Packs/day: 0.50    Pack years:  0.00    Types: Cigarettes   Smokeless tobacco: Never  Vaping Use   Vaping Use: Never used  Substance and Sexual Activity   Alcohol use: No   Drug use: Yes    Types: Marijuana   Sexual activity: Not on file  Other Topics Concern   Not on file  Social History Narrative   Not on file   Social Determinants of Health   Financial Resource Strain: Not on file  Food Insecurity: Not on file  Transportation Needs: Not on file  Physical Activity: Not on file  Stress: Not on file  Social Connections: Not on file  Intimate Partner Violence: Not on file     PHYSICAL EXAM  Vitals:   02/20/21 1045  BP: (!) 147/93  Pulse: (!) 106  Weight: 207 lb (93.9 kg)  Height: 5\' 4"  (1.626 m)    Body mass index is 35.53 kg/m.   General: The patient is well-developed and well-nourished and in no acute distress  HEENT:  Head is [place]/AT.  Sclera are anicteric.    Neck: No carotid bruits are noted.  The neck is nontender.  Cardiovascular: The heart has a regular rate and rhythm with a normal S1 and S2. There were no murmurs, gallops or rubs.    Skin: Extremities are without rash or  edema.  Musculoskeletal:  Back is nontender  Neurologic Exam  Mental status: The patient is alert and oriented x 3 at the time of the examination. The patient has apparent normal recent and remote memory, with an apparently normal attention span and concentration ability.   Speech is normal.  Cranial nerves: Extraocular movements are full.  Facial symmetry is present. There is good facial sensation to soft touch bilaterally.Facial strength is normal.  Trapezius and sternocleidomastoid strength is normal. No dysarthria is noted.  No obvious hearing deficits are noted.  Motor:  Muscle bulk is normal.   Tone is normal. Strength is 4+/5 in the right APB muscle, 4+/5 in the right triceps and right pronator muscles and 5 / 5 elsewhere.   Sensory: She reported reduced sensation to touch and temperature over the thenar  eminence on the right.  Normal vibration sensation in the right arm.  She  reported reduced sensation to pinprick and temperature sensation in the third and fourth fingers and adjacent palm on the left.  Sensation was normal elsewhere in the arms.  She had reduced sensation to vibration at the toes  Coordination: Cerebellar testing reveals good finger-nose-finger and heel-to-shin bilaterally.  Gait and station: Station is normal.   Gait is arthritic. Tandem gait is wide. Romberg is negative.   Reflexes: Deep tendon reflexes are symmetric and normal bilaterally.       DIAGNOSTIC DATA (LABS, IMAGING, TESTING) - I reviewed patient records, labs, notes, testing and imaging myself where available.  Lab Results  Component Value Date   WBC 9.4 12/21/2020   HGB 15.0 12/21/2020   HCT 45.6 (H) 12/21/2020   MCV 83.8 12/21/2020   PLT 338 12/21/2020      Component Value Date/Time   NA 139 12/21/2020 1423   NA 139 08/15/2020 1053   K 4.3 12/21/2020 1423   CL 101 12/21/2020 1423   CO2 23 12/21/2020 1423   GLUCOSE 172 (H) 12/21/2020 1423   BUN 15 12/21/2020 1423   BUN 8 08/15/2020 1053   CREATININE 0.76 12/21/2020 1423   CALCIUM 9.7 12/21/2020 1423   PROT 7.4 12/21/2020 1423   PROT 7.6 08/15/2020 1053   ALBUMIN 4.6 10/19/2020 1000   ALBUMIN 5.1 (H) 08/15/2020 1053   AST 14 12/21/2020 1423   ALT 14 12/21/2020 1423   ALKPHOS 115 10/19/2020 1000   BILITOT 0.3 12/21/2020 1423   BILITOT 0.5 08/15/2020 1053   GFRNONAA 88 12/21/2020 1423   GFRAA 102 12/21/2020 1423   Lab Results  Component Value Date   CHOL 169 12/21/2020   HDL 51 12/21/2020   LDLCALC 87 12/21/2020   TRIG 213 (H) 12/21/2020   CHOLHDL 3.3 12/21/2020   Lab Results  Component Value Date   HGBA1C 6.8 (H) 11/21/2019   No results found for: VITAMINB12 Lab Results  Component Value Date   TSH 0.65 12/21/2020       ASSESSMENT AND PLAN  Left arm pain - Plan: NCV with EMG(electromyography)  Left arm numbness -  Plan: NCV with EMG(electromyography)  Right carpal tunnel syndrome - Plan: NCV with EMG(electromyography)  History of fusion of cervical spine - Plan: NCV with EMG(electromyography)   In summary, [name] is a 55 year old woman with continued left arm numbness and pain and milder numbness in the right hand.  The distribution of the numbness could be consistent with a C7 radiculopathy and it is probable that it represents sequela of the disc herniation at C6-C7.  She also had minimal weakness in the C7 distribution on the left.  The symptoms in the right hand more likely to be due to a carpal tunnel syndrome.  We will check an NCV/EMG study to further characterize her symptoms.  A recent CT myelogram of the cervical spine did not show any moderate or severe foraminal narrowing.  I will see her when she returns for the NCV/EMG study.  In the interim, I have started pregabalin 75 mg twice a day.  That dose may be adjusted based on her response.  I did not schedule a follow-up but will see her when she comes in for the NCV/EMG study.  Thank you for asking me to see [name].  Please let me know if I can be of further assistance with her or other patients in the future.  Juanita Streight A. Felecia Shelling, MD, [facility] 12/06/621, 76:28 AM Certified in Neurology, Clinical Neurophysiology, Sleep Medicine and Neuroimaging  [facility] [address] [place], [place] [address] [phone]

## 2021-03-21 ENCOUNTER — Encounter: Payer: Medicare Other | Admitting: Neurology

## 2021-03-21 ENCOUNTER — Encounter: Payer: Self-pay | Admitting: Neurology

## 2021-04-20 ENCOUNTER — Emergency Department (HOSPITAL_COMMUNITY): Payer: Medicare Other

## 2021-04-20 ENCOUNTER — Emergency Department (HOSPITAL_COMMUNITY)
Admission: EM | Admit: 2021-04-20 | Discharge: 2021-04-20 | Disposition: A | Discharge status: Home / Self Care | Payer: Medicare Other | Attending: Emergency Medicine | Admitting: Emergency Medicine

## 2021-04-20 ENCOUNTER — Other Ambulatory Visit: Payer: Self-pay

## 2021-04-20 DIAGNOSIS — R1013 Epigastric pain: Secondary | ICD-10-CM | POA: Insufficient documentation

## 2021-04-20 DIAGNOSIS — Z794 Long term (current) use of insulin: Secondary | ICD-10-CM | POA: Insufficient documentation

## 2021-04-20 DIAGNOSIS — E119 Type 2 diabetes mellitus without complications: Secondary | ICD-10-CM | POA: Insufficient documentation

## 2021-04-20 DIAGNOSIS — Z79899 Other long term (current) drug therapy: Secondary | ICD-10-CM | POA: Insufficient documentation

## 2021-04-20 DIAGNOSIS — Z7984 Long term (current) use of oral hypoglycemic drugs: Secondary | ICD-10-CM | POA: Insufficient documentation

## 2021-04-20 DIAGNOSIS — I10 Essential (primary) hypertension: Secondary | ICD-10-CM | POA: Insufficient documentation

## 2021-04-20 DIAGNOSIS — R197 Diarrhea, unspecified: Secondary | ICD-10-CM | POA: Insufficient documentation

## 2021-04-20 DIAGNOSIS — F17210 Nicotine dependence, cigarettes, uncomplicated: Secondary | ICD-10-CM | POA: Insufficient documentation

## 2021-04-20 DIAGNOSIS — R1084 Generalized abdominal pain: Secondary | ICD-10-CM | POA: Diagnosis not present

## 2021-04-20 DIAGNOSIS — R112 Nausea with vomiting, unspecified: Secondary | ICD-10-CM | POA: Diagnosis not present

## 2021-04-20 LAB — COMPREHENSIVE METABOLIC PANEL
ALT: 25 U/L (ref 0–44)
AST: 21 U/L (ref 15–41)
Albumin: 4.6 g/dL (ref 3.5–5.0)
Alkaline Phosphatase: 97 U/L (ref 38–126)
Anion gap: 13 (ref 5–15)
BUN: 9 mg/dL (ref 6–20)
CO2: 21 mmol/L — ABNORMAL LOW (ref 22–32)
Calcium: 9.5 mg/dL (ref 8.9–10.3)
Chloride: 101 mmol/L (ref 98–111)
Creatinine, Ser: 0.64 mg/dL (ref 0.44–1.00)
GFR, Estimated: >60 mL/min (ref >60)
Glucose, Bld: 225 mg/dL — ABNORMAL HIGH (ref 70–99)
Potassium: 3.8 mmol/L (ref 3.5–5.1)
Sodium: 135 mmol/L (ref 135–145)
Total Bilirubin: 0.7 mg/dL (ref 0.3–1.2)
Total Protein: 8.6 g/dL — ABNORMAL HIGH (ref 6.5–8.1)

## 2021-04-20 LAB — CBC
HCT: 45.5 % (ref 36.0–46.0)
Hemoglobin: 14.9 g/dL (ref 12.0–15.0)
MCH: 28.2 pg (ref 26.0–34.0)
MCHC: 32.7 g/dL (ref 30.0–36.0)
MCV: 86 fL (ref 80.0–100.0)
Platelets: 353 10*3/uL (ref 150–400)
RBC: 5.29 MIL/uL — ABNORMAL HIGH (ref 3.87–5.11)
RDW: 14 % (ref 11.5–15.5)
WBC: 11.6 10*3/uL — ABNORMAL HIGH (ref 4.0–10.5)
nRBC: 0 % (ref 0.0–0.2)

## 2021-04-20 LAB — URINALYSIS, MICROSCOPIC (REFLEX)

## 2021-04-20 LAB — RAPID URINE DRUG SCREEN, HOSP PERFORMED
Amphetamines: NOT DETECTED
Barbiturates: NOT DETECTED
Benzodiazepines: POSITIVE — AB
Cocaine: NOT DETECTED
Opiates: NOT DETECTED
Tetrahydrocannabinol: POSITIVE — AB

## 2021-04-20 LAB — URINALYSIS, ROUTINE W REFLEX MICROSCOPIC
Glucose, UA: >=500 mg/dL — AB
Ketones, ur: >80 mg/dL — AB
Leukocytes,Ua: NEGATIVE
Nitrite: NEGATIVE
Protein, ur: NEGATIVE mg/dL
Specific Gravity, Urine: >1.03 — ABNORMAL HIGH (ref 1.005–1.030)
pH: 6 (ref 5.0–8.0)

## 2021-04-20 LAB — LIPASE, BLOOD: Lipase: 30 U/L (ref 11–51)

## 2021-04-20 MED ORDER — ONDANSETRON 4 MG PO TBDP: 4.0000 mg | ORAL_TABLET | Freq: Three times a day (TID) | ORAL | 0 refills | Status: DC | PRN

## 2021-04-20 MED ORDER — IOHEXOL 350 MG/ML SOLN
75.0000 mL | Freq: Once | INTRAVENOUS | Status: AC | PRN
  Administered 2021-04-20: 75 mL via INTRAVENOUS

## 2021-04-20 MED ORDER — CAPSAICIN 0.025 % EX CREA
TOPICAL_CREAM | Freq: Once | CUTANEOUS | Status: DC
  Filled 2021-04-20: qty 60

## 2021-04-20 MED ORDER — ONDANSETRON HCL 4 MG/2ML IJ SOLN
4.0000 mg | Freq: Once | INTRAMUSCULAR | Status: AC
  Administered 2021-04-20: 4 mg via INTRAVENOUS
  Filled 2021-04-20: qty 2

## 2021-04-20 MED ORDER — DICYCLOMINE HCL 20 MG PO TABS: 20.0000 mg | ORAL_TABLET | Freq: Two times a day (BID) | ORAL | 0 refills | Status: DC

## 2021-04-20 MED ORDER — METOPROLOL TARTRATE 5 MG/5ML IV SOLN
5.0000 mg | Freq: Once | INTRAVENOUS | Status: AC
  Administered 2021-04-20: 5 mg via INTRAVENOUS
  Filled 2021-04-20: qty 5

## 2021-04-20 MED ORDER — MORPHINE SULFATE (PF) 4 MG/ML IV SOLN
4.0000 mg | Freq: Once | INTRAVENOUS | Status: AC
  Administered 2021-04-20: 4 mg via INTRAVENOUS
  Filled 2021-04-20: qty 1

## 2021-04-20 MED ORDER — METOCLOPRAMIDE HCL 10 MG PO TABS: 10.0000 mg | ORAL_TABLET | Freq: Four times a day (QID) | ORAL | 0 refills | Status: DC

## 2021-04-20 MED ORDER — SODIUM CHLORIDE 0.9 % IV BOLUS
1000.0000 mL | Freq: Once | INTRAVENOUS | Status: AC
  Administered 2021-04-20: 1000 mL via INTRAVENOUS

## 2021-04-20 MED ORDER — HYDROMORPHONE HCL 1 MG/ML IJ SOLN
1.0000 mg | Freq: Once | INTRAMUSCULAR | Status: AC
  Administered 2021-04-20: 1 mg via INTRAVENOUS
  Filled 2021-04-20: qty 1

## 2021-04-20 NOTE — ED Notes (Signed)
Patient transported to CT 

## 2021-04-20 NOTE — ED Triage Notes (Signed)
Pt c/o abd pain x2 days, endorses NV, no diarrhea. Pt states pain is epigastric. Pt states food comes right back up, unable to tolerate even water.  10/10 burning, stabbing pain

## 2021-04-20 NOTE — ED Notes (Signed)
IV, IVF, meds, to CT

## 2021-04-20 NOTE — ED Provider Notes (Signed)
[facility] EMERGENCY DEPARTMENT Provider Note   CSN: [id number] Arrival date & time: 04/20/21  1537    History Chief Complaint  Patient presents with   Abdominal Pain    [name] is a 55 y.o. female here for evaluation of abd pain. States has had chronic abd pain since her back surgery in the 90's. Worsening over the last week to few days ago. Hx of similar. Told she likely had pancreatitis with unclear cause. Pain located to epigastric region. No radiation to back. Multiple episodes of NBNB emesis. No melena, BRBPR, no chronic NSAID use, Etoh use. Taking Phenergan intermittently at home without relief. Unable to keep down home meds. Uses Marijuana>Last use today. No fever, chills, CP, SOB, back pain, dysuria, flank pain, diarrhea, constipation. Unsure of last BM. States she is on home meds for chronic lower back pain, unchanged from baseline. Denies additional aggravating or alleviating factors.  Rates pain a 8/11.   Seen previous by [facility] who thought likely CVS vs Cannabinoid hyperemesis.   2020 gastric emptying neg, Endo neg Hx of non specific lipase elevated with normal CT scan per [facility] notes  Prior cholecystectomy   History obtained from patient and past medical records.  No interpreter used.    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   Complete tear of right rotator cuff 07/22/2017    Class: Chronic   Retained  orthopedic hardware 07/22/2017    Class: Chronic   Cervical disc herniation 07/21/2017    Class: Acute   Status post cervical spinal fusion 07/21/17 07/21/2017   Chronic back pain     Past Surgical History:  Procedure Laterality Date   ABDOMINAL HYSTERECTOMY     ANTERIOR CERVICAL DECOMP/DISCECTOMY FUSION N/A 07/21/2017   Procedure: ANTERIOR CERVICAL DISCECTOMY AND FUSION C6-7, REMOVAL OF SCREW C6 PLATE, DEPUY ZERO-P IMPLANT, LOCAL BONE GRAFT, ALLOGRAFT BONE GRAFT, VIVIGEN;  Surgeon: Jessy Oto, MD;  Location: [place];  Service: Orthopedics;  Laterality: N/A;   BACK SURGERY     laminectomy   BONE GRAFT HIP ILIAC CREST     BREAST BIOPSY     CARPAL TUNNEL RELEASE     CERVICAL DISCECTOMY  07/21/2017   CESAREAN SECTION     CHOLECYSTECTOMY     COLONOSCOPY     frontal fusion     neck fusion     patient reported   SHOULDER ARTHROSCOPY WITH SUBACROMIAL DECOMPRESSION, ROTATOR CUFF REPAIR AND BICEP TENDON REPAIR Right 10/31/2017   Procedure: RIGHT SHOULDER ARTHROSCOPY, MANIPULATION UNDER ANESTHESIA, BICEPS TENODESIS, AND MINI OPEN ROTATOR CUFF TEAR REPAIR;  Surgeon: Meredith Pel, MD;  Location: [place];  Service: Orthopedics;  Laterality: Right;     OB History   No obstetric history on file.     Family History  Problem Relation Age of Onset  Hypertension Father    Renal Disease Father    Diabetes Mother    Hypertension Mother    Edema Mother    Diabetes Sister    Diabetes Brother     Social History   Tobacco Use   Smoking status: Every Day    Packs/day: 0.50    Types: Cigarettes   Smokeless tobacco: Never  Vaping Use   Vaping Use: Never used  Substance Use Topics   Alcohol use: No   Drug use: Yes    Types: Marijuana    Home Medications Prior to Admission medications   Medication Sig Start Date End Date Taking? Authorizing Provider  dicyclomine (BENTYL) 20 MG tablet Take 1 tablet (20 mg total) by mouth 2 (two) times daily. 04/20/21  Yes Kyngston Pickelsimer A, PA-C   metoCLOPramide (REGLAN) 10 MG tablet Take 1 tablet (10 mg total) by mouth every 6 (six) hours. 04/20/21  Yes Gracynn Rajewski A, PA-C  ondansetron (ZOFRAN ODT) 4 MG disintegrating tablet Take 1 tablet (4 mg total) by mouth every 8 (eight) hours as needed for nausea or vomiting. 04/20/21  Yes Sonjia Wilcoxson A, PA-C  acetaminophen (TYLENOL) 500 MG tablet Take 500 mg by mouth 2 (two) times daily as needed for moderate pain.    [provider]  albuterol (PROVENTIL HFA;VENTOLIN HFA) 108 (90 BASE) MCG/ACT inhaler Inhale 2 puffs into the lungs every 4 (four) hours as needed for wheezing.    [provider]  albuterol (PROVENTIL) (2.5 MG/3ML) 0.083% nebulizer solution Take 3 mLs (2.5 mg total) by nebulization every 6 (six) hours as needed for wheezing or shortness of breath. 12/12/19   Tasia Catchings, Amy V, PA-C  ALPRAZolam Duanne Moron) 1 MG tablet Take 1 mg by mouth daily as needed for anxiety. 10/23/19   [provider]  cetirizine (ZYRTEC) 10 MG tablet Take 10 mg by mouth every evening. 09/27/20   [provider]  cyclobenzaprine (FLEXERIL) 10 MG tablet Take 1 tablet (10 mg total) by mouth 2 (two) times daily as needed for muscle spasms. 11/27/20   Jessy Oto, MD  DEXILANT 60 MG capsule Take 60 mg by mouth daily. 08/16/20   [provider]  escitalopram (LEXAPRO) 20 MG tablet Take 20 mg by mouth daily as needed (depression). 08/10/20   [provider]  fluticasone (FLONASE) 50 MCG/ACT nasal spray Place 1 spray into both nostrils daily as needed for allergies.    [provider]  insulin glargine (LANTUS) 100 unit/mL SOPN Inject 50 Units into the skin daily at 2 PM.    [provider]  JANUVIA 100 MG tablet Take 100 mg by mouth daily. 08/23/20   [provider]  metFORMIN (GLUCOPHAGE) 500 MG tablet Take 500 mg by mouth 2 (two) times daily. 07/29/17   [provider]  metoprolol succinate (TOPROL-XL) 50 MG 24 hr tablet Take 50 mg by mouth  daily. 07/21/14   [provider]  Multiple Vitamins-Minerals (CENTRUM PO) Take 1 tablet by mouth daily.    [provider]  omeprazole (PRILOSEC) 20 MG capsule Take 1 capsule (20 mg total) by mouth daily. 08/16/20   Luna Fuse, MD  pregabalin (LYRICA) 75 MG capsule Take 1 capsule (75 mg total) by mouth 2 (two) times daily. 02/20/21   Sater, Nanine Means, MD  simvastatin (ZOCOR) 20 MG tablet Take 20 mg by mouth daily. 08/22/20   [provider]  traMADol (ULTRAM) 50 MG tablet Take 0.5-1 tablets (25-50 mg total) by mouth 2 (  two) times daily as needed. 11/27/20   Jessy Oto, MD  VIIBRYD 40 MG TABS Take 40 mg by mouth daily. 06/03/19   [provider]  promethazine (PHENERGAN) 25 MG tablet Take 1 tablet (25 mg total) by mouth every 6 (six) hours as needed for nausea or vomiting. Patient not taking: No sig reported 08/15/20 10/19/20  Wieters, Hallie C, PA-C  sucralfate (CARAFATE) 1 g tablet Take 1 tablet (1 g total) by mouth 4 (four) times daily -  with meals and at bedtime. Patient not taking: No sig reported 04/24/20 10/19/20  Molpus, Jenny Reichmann, MD    Allergies    Patient has no known allergies.  Review of Systems   Review of Systems  Constitutional: Negative.   HENT: Negative.    Respiratory: Negative.    Cardiovascular: Negative.   Gastrointestinal:  Positive for abdominal pain, nausea and vomiting. Negative for abdominal distention, anal bleeding, blood in stool, constipation, diarrhea and rectal pain.  Genitourinary: Negative.   Musculoskeletal: Negative.   Skin: Negative.   Neurological: Negative.   All other systems reviewed and are negative.  Physical Exam Updated Vital Signs BP 137/77 (BP Location: Right Arm)   Pulse (!) 117   Temp 97.8 F (36.6 C)   Resp 16   SpO2 94%   Physical Exam Vitals and nursing note reviewed.  Constitutional:      General: She is not in acute distress.    Appearance: She is well-developed. She is not ill-appearing,  toxic-appearing or diaphoretic.  HENT:     Head: Normocephalic and atraumatic.     Mouth/Throat:     Mouth: Mucous membranes are moist.  Eyes:     Pupils: Pupils are equal, round, and reactive to light.  Cardiovascular:     Rate and Rhythm: Normal rate.     Pulses:          Dorsalis pedis pulses are 2+ on the right side and 2+ on the left side.     Heart sounds: Normal heart sounds.  Pulmonary:     Effort: Pulmonary effort is normal. No respiratory distress.     Breath sounds: Normal breath sounds.     Comments: Clear bilaterally, Speaks in full sentences without difficulty Abdominal:     General: Bowel sounds are normal. There is no distension.     Palpations: Abdomen is soft.     Tenderness: There is generalized abdominal tenderness. There is no right CVA tenderness, left CVA tenderness, guarding or rebound. Negative signs include Murphy's sign and McBurney's sign.     Hernia: No hernia is present.  Musculoskeletal:        General: Normal range of motion.     Cervical back: Normal range of motion.     Comments: Moves all 4 extremities without difficulty. Compartments soft.  Feet:     Comments: No edema Skin:    General: Skin is warm and dry.     Capillary Refill: Capillary refill takes less than 2 seconds.  Neurological:     General: No focal deficit present.     Mental Status: She is alert and oriented to person, place, and time.  Psychiatric:        Mood and Affect: Mood normal.   ED Results / Procedures / Treatments   Labs (all labs ordered are listed, but only abnormal results are displayed) Labs Reviewed  COMPREHENSIVE METABOLIC PANEL - Abnormal; Notable for the following components:      Result Value   CO2  21 (*)    Glucose, Bld 225 (*)    Total Protein 8.6 (*)    All other components within normal limits  CBC - Abnormal; Notable for the following components:   WBC 11.6 (*)    RBC 5.29 (*)    All other components within normal limits  URINALYSIS, ROUTINE W  REFLEX MICROSCOPIC - Abnormal; Notable for the following components:   Specific Gravity, Urine >1.030 (*)    Glucose, UA >=500 (*)    Hgb urine dipstick MODERATE (*)    Bilirubin Urine SMALL (*)    Ketones, ur >80 (*)    All other components within normal limits  RAPID URINE DRUG SCREEN, HOSP PERFORMED - Abnormal; Notable for the following components:   Benzodiazepines POSITIVE (*)    Tetrahydrocannabinol POSITIVE (*)    All other components within normal limits  URINALYSIS, MICROSCOPIC (REFLEX) - Abnormal; Notable for the following components:   Bacteria, UA RARE (*)    All other components within normal limits  LIPASE, BLOOD    EKG None  Radiology CT Abdomen Pelvis W Contrast  Result Date: 04/20/2021 CLINICAL DATA:  Epigastric pain. EXAM: CT ABDOMEN AND PELVIS WITH CONTRAST TECHNIQUE: Multidetector CT imaging of the abdomen and pelvis was performed using the standard protocol following bolus administration of intravenous contrast. CONTRAST:  56m OMNIPAQUE IOHEXOL 350 MG/ML SOLN COMPARISON:  CT abdomen and pelvis 10/19/2020. CT abdomen and pelvis 06/15/2015. FINDINGS: Lower chest: There are atelectatic changes in the right middle lobe and right lower lobe. Hepatobiliary: No focal liver abnormality is seen. Status post cholecystectomy. No biliary dilatation. Pancreas: Unremarkable. No pancreatic ductal dilatation or surrounding inflammatory changes. Spleen: Normal in size without focal abnormality. Adrenals/Urinary Tract: There are 2 left adrenal nodules measuring up to 15 mm which are unchanged from the prior examination dating back to 2016. A few punctate calcifications are seen in the left adrenal gland. The right adrenal gland is within normal limits. Bilateral kidneys and bladder are within normal limits. Stomach/Bowel: Stomach is within normal limits. Appendix appears normal. No evidence of bowel wall thickening, distention, or inflammatory changes. Vascular/Lymphatic: No significant  vascular findings are present. No enlarged abdominal or pelvic lymph nodes. Reproductive: The uterus is small or absent. Adnexa are unremarkable. Other: There is no abdominopelvic ascites. There is a small fat containing umbilical hernia. There is scarring in the lower anterior abdominal wall. Musculoskeletal: There are degenerative changes of both hips. Postsurgical changes are seen at the lower L4-L5 and L5-S1. IMPRESSION: 1. No acute localizing process in the abdomen or pelvis. Electronically Signed   By: ARonney AstersM.D.   On: 04/20/2021 20:17    Procedures Procedures   Medications Ordered in ED Medications  capsaicin (ZOSTRIX) 0.025 % cream ( Topical Not Given 04/20/21 2052)  ondansetron (ZOFRAN) injection 4 mg (4 mg Intravenous Given 04/20/21 1922)  sodium chloride 0.9 % bolus 1,000 mL (0 mLs Intravenous Stopped 04/20/21 2053)  morphine 4 MG/ML injection 4 mg (4 mg Intravenous Given 04/20/21 1922)  iohexol (OMNIPAQUE) 350 MG/ML injection 75 mL (75 mLs Intravenous Contrast Given 04/20/21 1937)  HYDROmorphone (DILAUDID) injection 1 mg (1 mg Intravenous Given 04/20/21 2052)  metoprolol tartrate (LOPRESSOR) injection 5 mg (5 mg Intravenous Given 04/20/21 2149)   ED Course  I have reviewed the triage vital signs and the nursing notes.  Pertinent labs & imaging results that were available during my care of the patient were reviewed by me and considered in my medical decision making (see chart for  details).  Here for evaluation of abdominal pain, nausea and vomiting.  Afebrile, nonseptic, not ill-appearing. Has been seen previously multiple times for similar.  She uses marijuana weekly, last use today.  She is neurovascularly intact.  No chest pain, shortness of breath to suggest atypical cardiac etiology of her symptoms.  She has chronic back pain which is not changed.  No bowel or bladder incontinence, saddle paresthesia.  No clinical evidence of VTE, or fluid overload on exam.  No melena, bright red per  rectum.  Plan on labs, imaging and reassess  Labs and imaging first reviewed and interpreted:  CBC leukocytosis at 11.6 CMP with CO2 21, glucose 225 Lipase 30 UA neg for infection, does have ketones UDS positive for benzo and THC CT abdomen and pelvis without acute abnormality  Patient reassessed. Feels improved however with some pain still. States feels similar to previous episodes. She is tachycardic and hypertensive however I feel this is likely from unable to keep down her home meds, including Metoprolol.  Patient reassessed. Given home meds with improvement in BP and HR. Low suspicion for acute intrathoracic etiology such as infection, dissection, PE as cause of her elevated blood pressure and tachycardia. Patient able to tolerate PO here without difficulty. Likely has acute on chronic abd pain. Discussed trial of Reglan and PRN Zofran for sx and close FU with her Gastro at [facility].  She is agreeable.  I did stress cessation of marijuana given history of cannabinoid induced vomiting.   Patient is nontoxic, nonseptic appearing, in no apparent distress.  Patient's pain and other symptoms adequately managed in emergency department.  Fluid bolus given.  Labs, imaging and vitals reviewed.  Patient does not meet the SIRS or Sepsis criteria.  On repeat exam patient does not have a surgical abdomin and there are no peritoneal signs.  No indication of appendicitis, bowel obstruction, bowel perforation, cholecystitis, diverticulitis, PID or ectopic pregnancy.  P  The patient has been appropriately medically screened and/or stabilized in the ED. I have low suspicion for any other emergent medical condition which would require further screening, evaluation or treatment in the ED or require inpatient management.  Patient is hemodynamically stable and in no acute distress.  Patient able to ambulate in department prior to ED.  Evaluation does not show acute pathology that would require ongoing or additional  emergent interventions while in the emergency department or further inpatient treatment.  I have discussed the diagnosis with the patient and answered all questions.  Pain is been managed while in the emergency department and patient has no further complaints prior to discharge.  Patient is comfortable with plan discussed in room and is stable for discharge at this time.  I have discussed strict return precautions for returning to the emergency department.  Patient was encouraged to follow-up with PCP/specialist refer to at discharge.      MDM Rules/Calculators/A&P                            Final Clinical Impression(s) / ED Diagnoses Final diagnoses:  Epigastric pain  Nausea vomiting and diarrhea    Rx / DC Orders ED Discharge Orders          Ordered    metoCLOPramide (REGLAN) 10 MG tablet  Every 6 hours        04/20/21 2313    ondansetron (ZOFRAN ODT) 4 MG disintegrating tablet  Every 8 hours PRN  04/20/21 2313    dicyclomine (BENTYL) 20 MG tablet  2 times daily        04/20/21 2313             Rasul Decola A, PA-C 04/20/21 2332    Tegeler, Gwenyth Allegra, MD 04/21/21 [phone]

## 2021-04-20 NOTE — Discharge Instructions (Signed)
Take the Bentyl for abdominal spasms  Take the Zofran for nausea and vomiting  Take the Reglan for motility issues with your stomach  Follow up with your Gastroenterologist  Return for new or worsening symptoms

## 2021-05-17 IMAGING — CT CT ABD-PELV W/ CM
2 of 5 series · 16 of 46 positions shown, 18 images · IV contrast (omnipaque)
Comparison: July 12, 2020

CLINICAL DATA: Left upper quadrant abdominal pain. Shortness of
breath.

EXAM:
CT ANGIOGRAPHY CHEST
CT ABDOMEN AND PELVIS WITH CONTRAST
TECHNIQUE: Multidetector CT imaging of the chest was performed using the
standard protocol during bolus administration of intravenous
contrast. Multiplanar CT image reconstructions and MIPs were
obtained to evaluate the vascular anatomy. Multidetector CT imaging
of the abdomen and pelvis was performed using the standard protocol
during bolus administration of intravenous contrast.
CONTRAST:  100mL OMNIPAQUE IOHEXOL 350 MG/ML SOLN

[Series 5: abdomen 5.0 (person_name) · axial · 0.83mm/px · z∈[+922,+1312]mm · 13 of 92 slices shown, 15 images]
[im 7/92  soft-tissue]
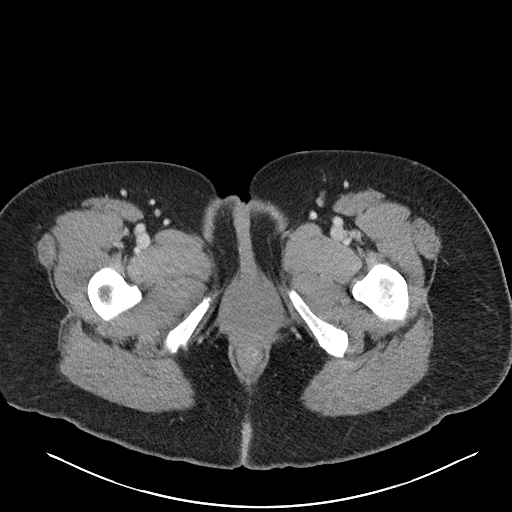
[im 7/92  bone]
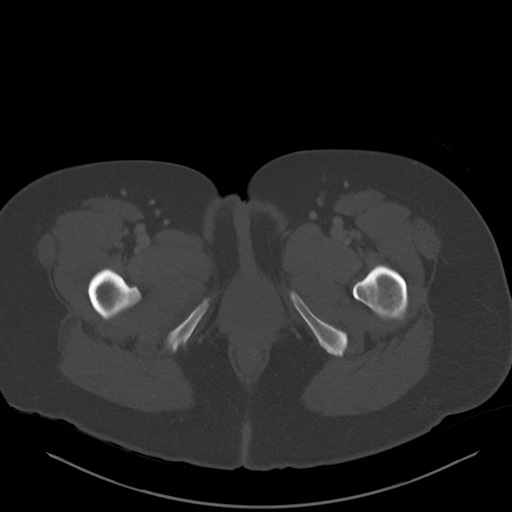
[im 14/92  soft-tissue]
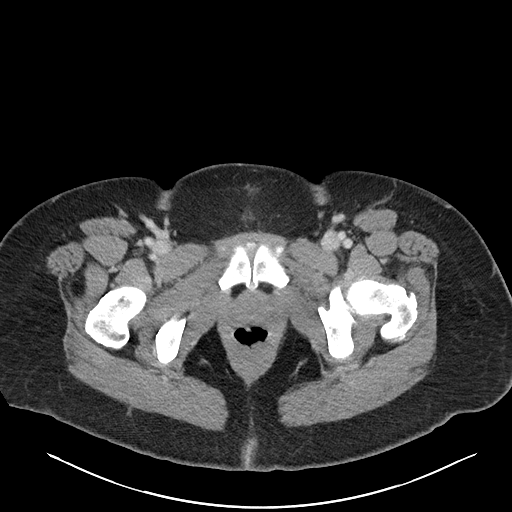
[im 20/92  soft-tissue]
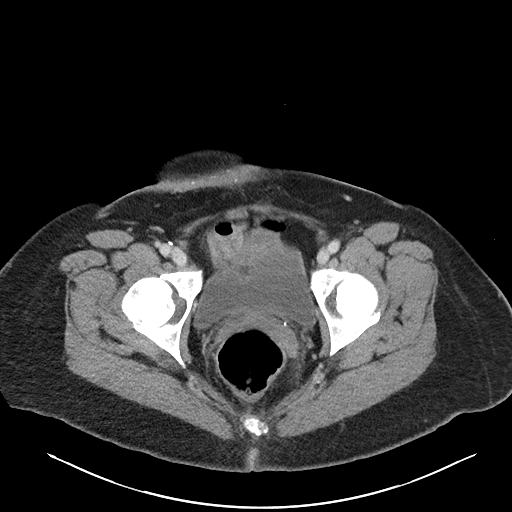
[im 27/92  soft-tissue]
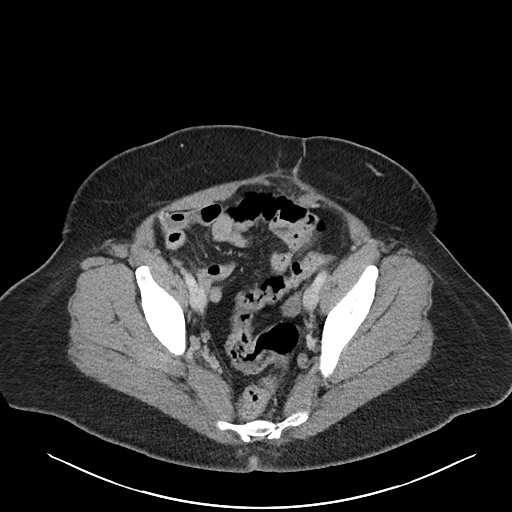
[im 33/92  soft-tissue]
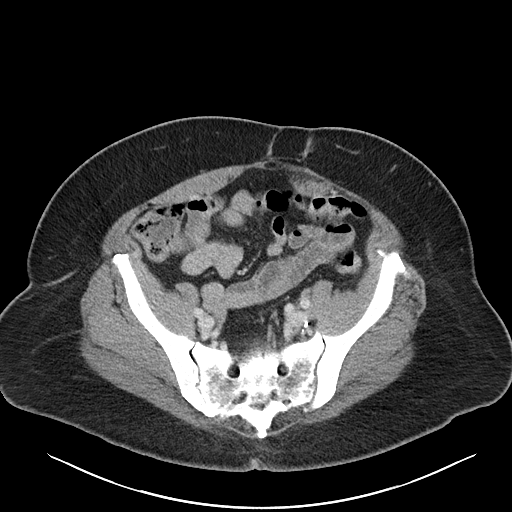
[im 40/92  soft-tissue]
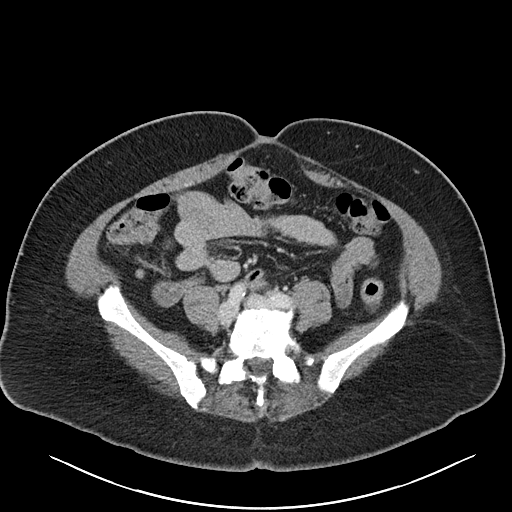
[im 46/92  soft-tissue]
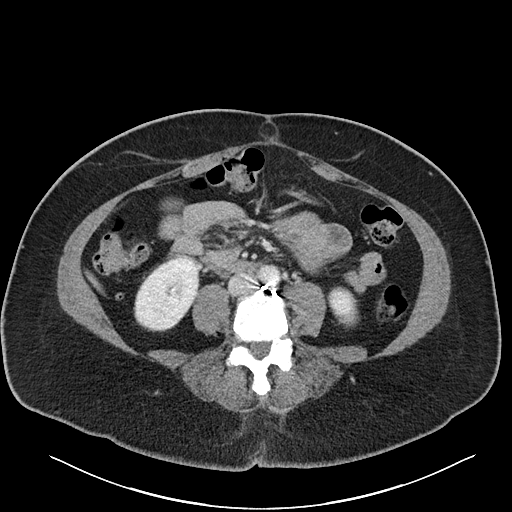
[im 53/92  soft-tissue]
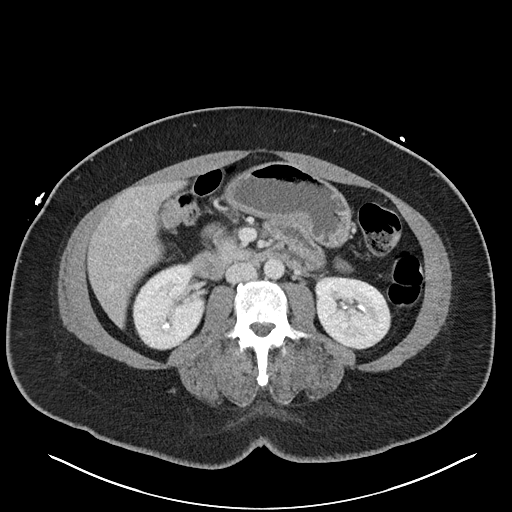
[im 59/92  soft-tissue]
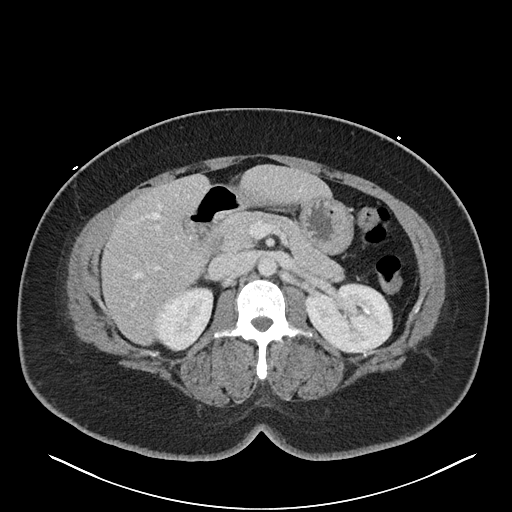
[im 59/92  bone]
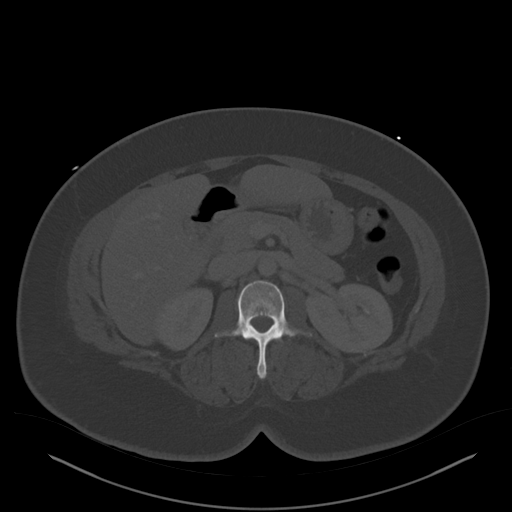
[im 66/92  soft-tissue]
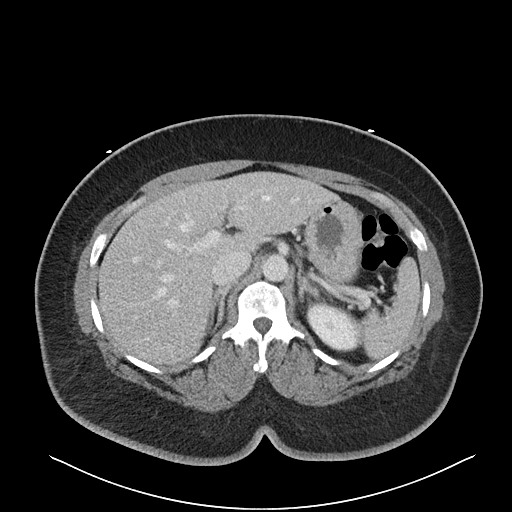
[im 72/92  soft-tissue]
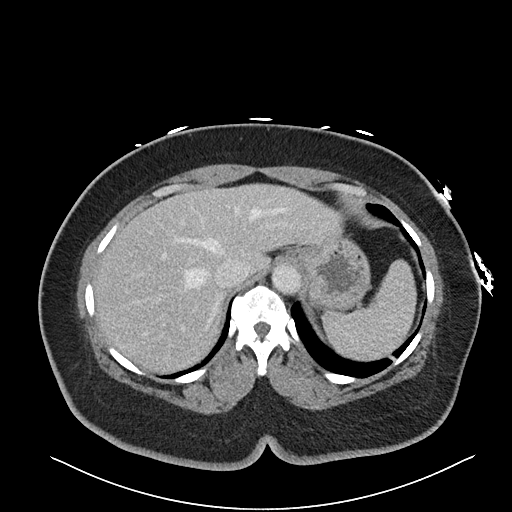
[im 79/92  soft-tissue]
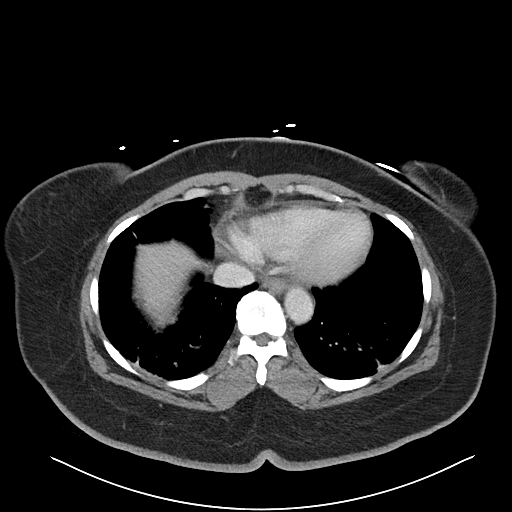
[im 85/92  soft-tissue]
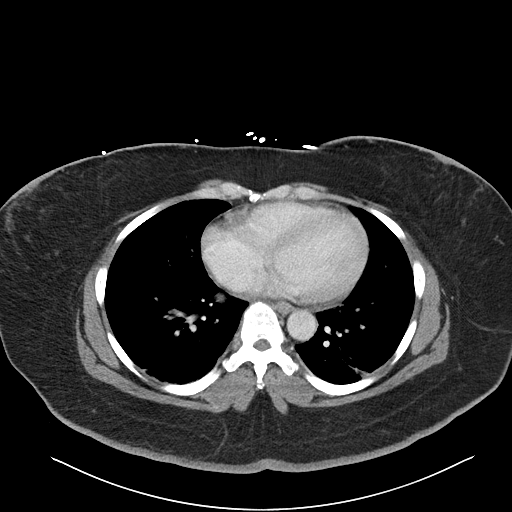

[Series 8: abdomen 3.0 (person_name) · coronal · 0.91mm/px · 3 of 103 slices shown]
[im 35/103  soft-tissue]
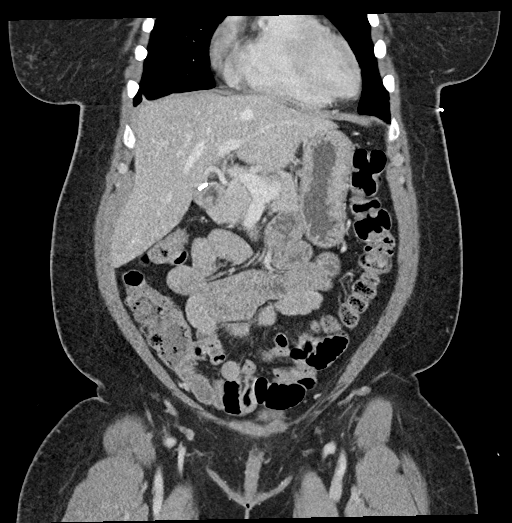
[im 46/103  soft-tissue]
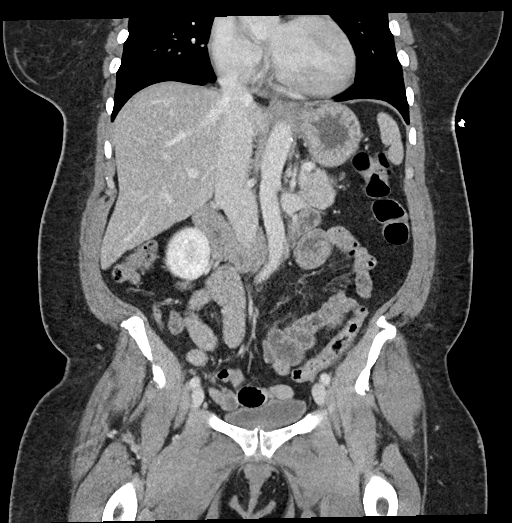
[im 57/103  soft-tissue]
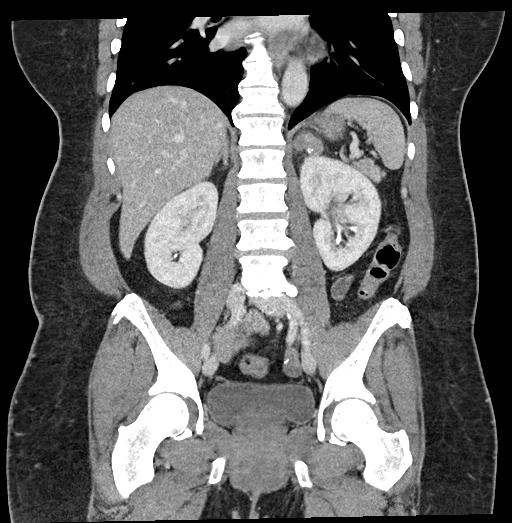

[16 of 46 positions shown; findings below may reference images not displayed]

FINDINGS: CTA CHEST FINDINGS

Cardiovascular: Contrast injection is sufficient to demonstrate
satisfactory opacification of the pulmonary arteries to the
segmental level. There is no pulmonary embolus or evidence of right
heart strain. The size of the main pulmonary artery is normal.
Moderate cardiomegaly. The course and caliber of the aorta are
normal. There is mild atherosclerotic calcification. Opacification
decreased due to pulmonary arterial phase contrast bolus timing.

Mediastinum/Nodes:

-- No mediastinal lymphadenopathy.

-- No hilar lymphadenopathy.

-- No axillary lymphadenopathy.

-- No supraclavicular lymphadenopathy.

-- Normal thyroid gland where visualized.

-  Unremarkable esophagus.

Lungs/Pleura: Atelectasis is noted at the lung bases. There is no
pneumothorax or large pleural effusion.

Musculoskeletal: No chest wall abnormality. No bony spinal canal
stenosis.

Review of the MIP images confirms the above findings.

CT ABDOMEN and PELVIS FINDINGS

Hepatobiliary: The liver is normal. Status post
cholecystectomy.There is no biliary ductal dilation.

Pancreas: Normal contours without ductal dilatation. No
peripancreatic fluid collection.

Spleen: Unremarkable.

Adrenals/Urinary Tract:

--Adrenal glands: There is a stable partially calcified left adrenal
nodule.

--Right kidney/ureter: No hydronephrosis or radiopaque kidney
stones.

--Left kidney/ureter: No hydronephrosis or radiopaque kidney stones.

--Urinary bladder: Unremarkable.

Stomach/Bowel:

--Stomach/Duodenum: No hiatal hernia or other gastric abnormality.
Normal duodenal course and caliber.

--Small bowel: Unremarkable.

--Colon: Unremarkable.

--Appendix: Normal.

Vascular/Lymphatic: Normal course and caliber of the major abdominal
vessels.

--No retroperitoneal lymphadenopathy.

--No mesenteric lymphadenopathy.

--No pelvic or inguinal lymphadenopathy.

Reproductive: Unremarkable

Other: No ascites or free air. Again noted is a small fat containing
ventral wall hernia.

Musculoskeletal. No acute displaced fractures.

Review of the MIP images confirms the above findings.
IMPRESSION: 1. No acute findings of the chest, abdomen or pelvis.
2. Moderate cardiomegaly.
3. Atelectasis at the lung bases.

Aortic Atherosclerosis ([id number]).

## 2021-06-22 ENCOUNTER — Emergency Department (HOSPITAL_COMMUNITY): Payer: Medicare Other

## 2021-06-22 ENCOUNTER — Encounter (HOSPITAL_COMMUNITY): Payer: Self-pay | Admitting: Emergency Medicine

## 2021-06-22 ENCOUNTER — Other Ambulatory Visit: Payer: Self-pay

## 2021-06-22 ENCOUNTER — Emergency Department (HOSPITAL_COMMUNITY)
Admission: EM | Admit: 2021-06-22 | Discharge: 2021-06-22 | Disposition: A | Discharge status: Home / Self Care | Payer: Medicare Other | Attending: Emergency Medicine | Admitting: Emergency Medicine

## 2021-06-22 DIAGNOSIS — I10 Essential (primary) hypertension: Secondary | ICD-10-CM | POA: Diagnosis not present

## 2021-06-22 DIAGNOSIS — K219 Gastro-esophageal reflux disease without esophagitis: Secondary | ICD-10-CM | POA: Insufficient documentation

## 2021-06-22 DIAGNOSIS — E1165 Type 2 diabetes mellitus with hyperglycemia: Secondary | ICD-10-CM | POA: Insufficient documentation

## 2021-06-22 DIAGNOSIS — R1013 Epigastric pain: Secondary | ICD-10-CM | POA: Insufficient documentation

## 2021-06-22 DIAGNOSIS — R197 Diarrhea, unspecified: Secondary | ICD-10-CM | POA: Diagnosis not present

## 2021-06-22 DIAGNOSIS — Z79899 Other long term (current) drug therapy: Secondary | ICD-10-CM | POA: Insufficient documentation

## 2021-06-22 DIAGNOSIS — D72829 Elevated white blood cell count, unspecified: Secondary | ICD-10-CM | POA: Diagnosis not present

## 2021-06-22 DIAGNOSIS — Z794 Long term (current) use of insulin: Secondary | ICD-10-CM | POA: Insufficient documentation

## 2021-06-22 DIAGNOSIS — R1010 Upper abdominal pain, unspecified: Secondary | ICD-10-CM | POA: Insufficient documentation

## 2021-06-22 DIAGNOSIS — R6883 Chills (without fever): Secondary | ICD-10-CM | POA: Diagnosis not present

## 2021-06-22 DIAGNOSIS — F1290 Cannabis use, unspecified, uncomplicated: Secondary | ICD-10-CM | POA: Diagnosis not present

## 2021-06-22 DIAGNOSIS — Z7984 Long term (current) use of oral hypoglycemic drugs: Secondary | ICD-10-CM | POA: Diagnosis not present

## 2021-06-22 DIAGNOSIS — Z20822 Contact with and (suspected) exposure to covid-19: Secondary | ICD-10-CM | POA: Insufficient documentation

## 2021-06-22 DIAGNOSIS — R112 Nausea with vomiting, unspecified: Secondary | ICD-10-CM | POA: Insufficient documentation

## 2021-06-22 DIAGNOSIS — F17210 Nicotine dependence, cigarettes, uncomplicated: Secondary | ICD-10-CM | POA: Insufficient documentation

## 2021-06-22 LAB — URINALYSIS, ROUTINE W REFLEX MICROSCOPIC
Bilirubin Urine: NEGATIVE
Glucose, UA: >=500 mg/dL — AB
Hgb urine dipstick: NEGATIVE
Ketones, ur: 80 mg/dL — AB
Leukocytes,Ua: NEGATIVE
Nitrite: NEGATIVE
Protein, ur: NEGATIVE mg/dL
Specific Gravity, Urine: 1.038 — ABNORMAL HIGH (ref 1.005–1.030)
pH: 7 (ref 5.0–8.0)

## 2021-06-22 LAB — COMPREHENSIVE METABOLIC PANEL
ALT: 19 U/L (ref 0–44)
AST: 20 U/L (ref 15–41)
Albumin: 4.7 g/dL (ref 3.5–5.0)
Alkaline Phosphatase: 101 U/L (ref 38–126)
Anion gap: 11 (ref 5–15)
BUN: 14 mg/dL (ref 6–20)
CO2: 22 mmol/L (ref 22–32)
Calcium: 9 mg/dL (ref 8.9–10.3)
Chloride: 101 mmol/L (ref 98–111)
Creatinine, Ser: 0.64 mg/dL (ref 0.44–1.00)
GFR, Estimated: >60 mL/min (ref >60)
Glucose, Bld: 285 mg/dL — ABNORMAL HIGH (ref 70–99)
Potassium: 3.9 mmol/L (ref 3.5–5.1)
Sodium: 134 mmol/L — ABNORMAL LOW (ref 135–145)
Total Bilirubin: 0.8 mg/dL (ref 0.3–1.2)
Total Protein: 8.2 g/dL — ABNORMAL HIGH (ref 6.5–8.1)

## 2021-06-22 LAB — CBC
HCT: 43.5 % (ref 36.0–46.0)
Hemoglobin: 14.9 g/dL (ref 12.0–15.0)
MCH: 28.3 pg (ref 26.0–34.0)
MCHC: 34.3 g/dL (ref 30.0–36.0)
MCV: 82.5 fL (ref 80.0–100.0)
Platelets: 359 10*3/uL (ref 150–400)
RBC: 5.27 MIL/uL — ABNORMAL HIGH (ref 3.87–5.11)
RDW: 13.1 % (ref 11.5–15.5)
WBC: 10.6 10*3/uL — ABNORMAL HIGH (ref 4.0–10.5)
nRBC: 0 % (ref 0.0–0.2)

## 2021-06-22 LAB — LIPASE, BLOOD: Lipase: 55 U/L — ABNORMAL HIGH (ref 11–51)

## 2021-06-22 LAB — RESP PANEL BY RT-PCR (FLU A&B, COVID) ARPGX2
Influenza A by PCR: NEGATIVE
Influenza B by PCR: NEGATIVE
SARS Coronavirus 2 by RT PCR: NEGATIVE

## 2021-06-22 LAB — I-STAT BETA HCG BLOOD, ED (MC, WL, AP ONLY): I-stat hCG, quantitative: <5 m[IU]/mL (ref <5)

## 2021-06-22 MED ORDER — PANTOPRAZOLE SODIUM 20 MG PO TBEC: 20.0000 mg | DELAYED_RELEASE_TABLET | Freq: Every day | ORAL | 0 refills | Status: DC

## 2021-06-22 MED ORDER — SUCRALFATE 1 G PO TABS: 1.0000 g | ORAL_TABLET | Freq: Three times a day (TID) | ORAL | 0 refills | Status: DC

## 2021-06-22 MED ORDER — HYDROMORPHONE HCL 1 MG/ML IJ SOLN
1.0000 mg | Freq: Once | INTRAMUSCULAR | Status: AC
  Administered 2021-06-22: 1 mg via INTRAVENOUS
  Filled 2021-06-22: qty 1

## 2021-06-22 MED ORDER — PANTOPRAZOLE SODIUM 40 MG IV SOLR
40.0000 mg | Freq: Once | INTRAVENOUS | Status: AC
  Administered 2021-06-22: 40 mg via INTRAVENOUS
  Filled 2021-06-22: qty 40

## 2021-06-22 MED ORDER — LIDOCAINE VISCOUS HCL 2 % MT SOLN: 15.0000 mL | Freq: Once | OROMUCOSAL | Status: DC

## 2021-06-22 MED ORDER — ALUM & MAG HYDROXIDE-SIMETH 200-200-20 MG/5ML PO SUSP: 30.0000 mL | Freq: Once | ORAL | Status: DC

## 2021-06-22 MED ORDER — ONDANSETRON HCL 4 MG/2ML IJ SOLN
4.0000 mg | Freq: Once | INTRAMUSCULAR | Status: AC
  Administered 2021-06-22: 4 mg via INTRAVENOUS
  Filled 2021-06-22: qty 2

## 2021-06-22 MED ORDER — IOHEXOL 350 MG/ML SOLN
100.0000 mL | Freq: Once | INTRAVENOUS | Status: AC | PRN
  Administered 2021-06-22: 75 mL via INTRAVENOUS

## 2021-06-22 MED ORDER — KETOROLAC TROMETHAMINE 15 MG/ML IJ SOLN
15.0000 mg | Freq: Once | INTRAMUSCULAR | Status: AC
  Administered 2021-06-22: 15 mg via INTRAVENOUS
  Filled 2021-06-22: qty 1

## 2021-06-22 MED ORDER — MORPHINE SULFATE (PF) 4 MG/ML IV SOLN
4.0000 mg | Freq: Once | INTRAVENOUS | Status: AC
  Administered 2021-06-22: 4 mg via INTRAVENOUS
  Filled 2021-06-22: qty 1

## 2021-06-22 NOTE — Discharge Instructions (Addendum)
You came to the emergency department today to be evaluated for your epigastric abdominal pain.  Your CT scan showed a possible gastritis.  No other acute abnormalities were seen on your CT scan.  Your lab work was reassuring.  Your symptoms improved after receiving pain management in the ED.  You were able to tolerate liquids.  I have given you prescription for Protonix and Carafate.  Please take these medications as prescribed.  Please follow-up with your gastroenterologist.  If you cannot follow-up with your previous provider I have given you information to follow-up with [facility].  Get help right away if: Your pain does not go away as soon as your health care provider told you to expect. You cannot stop vomiting. Your pain is only in areas of the abdomen, such as the right side or the left lower portion of the abdomen. Pain on the right side could be caused by appendicitis. You have bloody or black stools, or stools that look like tar. You have severe pain, cramping, or bloating in your abdomen. You have signs of dehydration, such as: Dark urine, very little urine, or no urine. Cracked lips. Dry mouth. Sunken eyes. Sleepiness. Weakness. You have trouble breathing or chest pain.

## 2021-06-22 NOTE — ED Notes (Signed)
Pt states she cannot tolerate PO at this time.

## 2021-06-22 NOTE — ED Triage Notes (Signed)
Patient BIB [facility] from home. Abdominal pain and vomiting past 3 days. Has not been able to eat/drink anything. Pain is epigastric.

## 2021-06-22 NOTE — ED Provider Notes (Signed)
[facility] DEPT Provider Note   CSN: [id number] Arrival date & time: 06/22/21  6237     History Chief Complaint  Patient presents with   Abdominal Pain    [name] is a 56 y.o. female with a history of hypertension, diabetes mellitus, GERD, pancreatitis.  Per chart review patient has been seen multiple times in the emergency department for complaints of epigastric pain and upper abdominal pain.  Most recently patient was seen 04/20/2021.  Patient had CT abdomen pelvis without acute abnormality.  Patient is followed by [facility] gastroenterology.  Patient presents emergency department with a chief complaint of epigastric abdominal pain, nausea, and vomiting.  Patient reports her symptoms started on Tuesday 11/8.  Patient reports that abdominal pain has been intermittent since then.  Patient states that pain comes on randomly.  Pain is worse with eating.  Patient has not tried any modalities to alleviate her symptoms.  Pain sometimes radiates to right upper quadrant.  At present patient rates pain 10/10 on the pain scale.  Patient states that this pain is similar to previous episodes she has had in the past.  Patient states she has vomited approximately 7-8 times in the last 24 hours.  Patient describes emesis as "foamy and sometimes yellow."  Patient denies any hematemesis or coffee-ground emesis.  Patient also endorses diarrhea and chills.  Patient denies any fevers, constipation, abdominal distention, blood in stool, melena, dysuria, hematuria, urinary urgency, vaginal pain, vaginal bleeding, vaginal discharge, chest pain, shortness of breath.  Patient denies any EtOH use.  Patient endorses occasional marijuana use.   Abdominal Pain Associated symptoms: diarrhea and nausea   Associated symptoms: no chest pain, no chills, no constipation, no dysuria, no fever, no hematuria, no shortness of breath, no vaginal bleeding, no vaginal discharge and no vomiting        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   Complete tear of right rotator cuff 07/22/2017    Class: Chronic   Retained orthopedic hardware 07/22/2017    Class: Chronic   Cervical disc herniation 07/21/2017    Class: Acute   Status post cervical spinal fusion 07/21/17 07/21/2017   Chronic back pain     Past Surgical History:  Procedure Laterality Date   ABDOMINAL HYSTERECTOMY     ANTERIOR CERVICAL DECOMP/DISCECTOMY FUSION N/A 07/21/2017   Procedure: ANTERIOR CERVICAL DISCECTOMY AND FUSION C6-7, REMOVAL OF SCREW C6 PLATE, DEPUY ZERO-P IMPLANT, LOCAL BONE GRAFT, ALLOGRAFT BONE GRAFT, VIVIGEN;  Surgeon: Jessy Oto, MD;  Location: [facility];  Service: Orthopedics;  Laterality: N/A;   BACK SURGERY     laminectomy   BONE GRAFT HIP ILIAC CREST     BREAST BIOPSY     CARPAL TUNNEL RELEASE     CERVICAL DISCECTOMY  07/21/2017   CESAREAN SECTION     CHOLECYSTECTOMY  COLONOSCOPY     frontal fusion     neck fusion     patient reported   SHOULDER ARTHROSCOPY WITH SUBACROMIAL DECOMPRESSION, ROTATOR CUFF REPAIR AND BICEP TENDON REPAIR Right 10/31/2017   Procedure: RIGHT SHOULDER ARTHROSCOPY, MANIPULATION UNDER ANESTHESIA, BICEPS TENODESIS, AND MINI OPEN ROTATOR CUFF TEAR REPAIR;  Surgeon: Meredith Pel, MD;  Location: [facility];  Service: Orthopedics;  Laterality: Right;     OB History   No obstetric history on file.     Family History  Problem Relation Age  of Onset   Hypertension Father    Renal Disease Father    Diabetes Mother    Hypertension Mother    Edema Mother    Diabetes Sister    Diabetes Brother     Social History   Tobacco Use   Smoking status: Every Day    Packs/day: 0.50    Types: Cigarettes   Smokeless tobacco: Never  Vaping Use   Vaping Use: Never used  Substance Use Topics   Alcohol use: No   Drug use: Not Currently    Types: Marijuana    Home Medications Prior to Admission medications   Medication Sig Start Date End Date Taking? Authorizing Provider  acetaminophen (TYLENOL) 500 MG tablet Take 500 mg by mouth 2 (two) times daily as needed for moderate pain.    [provider]  albuterol (PROVENTIL HFA;VENTOLIN HFA) 108 (90 BASE) MCG/ACT inhaler Inhale 2 puffs into the lungs every 4 (four) hours as needed for wheezing.    [provider]  albuterol (PROVENTIL) (2.5 MG/3ML) 0.083% nebulizer solution Take 3 mLs (2.5 mg total) by nebulization every 6 (six) hours as needed for wheezing or shortness of breath. 12/12/19   Tasia Catchings, Amy V, PA-C  ALPRAZolam Duanne Moron) 1 MG tablet Take 1 mg by mouth daily as needed for anxiety. 10/23/19   [provider]  cetirizine (ZYRTEC) 10 MG tablet Take 10 mg by mouth every evening. 09/27/20   [provider]  cyclobenzaprine (FLEXERIL) 10 MG tablet Take 1 tablet (10 mg total) by mouth 2 (two) times daily as needed for muscle spasms. 11/27/20   Jessy Oto, MD  DEXILANT 60 MG capsule Take 60 mg by mouth daily. 08/16/20   [provider]  dicyclomine (BENTYL) 20 MG tablet Take 1 tablet (20 mg total) by mouth 2 (two) times daily. 04/20/21   Henderly, Britni A, PA-C  escitalopram (LEXAPRO) 20 MG tablet Take 20 mg by mouth daily as needed (depression). 08/10/20   [provider]  fluticasone (FLONASE) 50 MCG/ACT nasal spray Place 1 spray into both nostrils daily as needed for allergies.    [provider]  insulin glargine (LANTUS) 100  unit/mL SOPN Inject 50 Units into the skin daily at 2 PM.    [provider]  JANUVIA 100 MG tablet Take 100 mg by mouth daily. 08/23/20   [provider]  metFORMIN (GLUCOPHAGE) 500 MG tablet Take 500 mg by mouth 2 (two) times daily. 07/29/17   [provider]  metoCLOPramide (REGLAN) 10 MG tablet Take 1 tablet (10 mg total) by mouth every 6 (six) hours. 04/20/21   Henderly, Britni A, PA-C  metoprolol succinate (TOPROL-XL) 50 MG 24 hr tablet Take 50 mg by mouth daily. 07/21/14   [provider]  Multiple Vitamins-Minerals (CENTRUM PO) Take 1 tablet by mouth daily.    [provider]  omeprazole (PRILOSEC) 20 MG capsule Take 1 capsule (20 mg total) by mouth daily. 08/16/20  Luna Fuse, MD  ondansetron (ZOFRAN ODT) 4 MG disintegrating tablet Take 1 tablet (4 mg total) by mouth every 8 (eight) hours as needed for nausea or vomiting. 04/20/21   Henderly, Britni A, PA-C  pregabalin (LYRICA) 75 MG capsule Take 1 capsule (75 mg total) by mouth 2 (two) times daily. 02/20/21   Sater, Nanine Means, MD  simvastatin (ZOCOR) 20 MG tablet Take 20 mg by mouth daily. 08/22/20   [provider]  traMADol (ULTRAM) 50 MG tablet Take 0.5-1 tablets (25-50 mg total) by mouth 2 (two) times daily as needed. 11/27/20   Jessy Oto, MD  VIIBRYD 40 MG TABS Take 40 mg by mouth daily. 06/03/19   [provider]  promethazine (PHENERGAN) 25 MG tablet Take 1 tablet (25 mg total) by mouth every 6 (six) hours as needed for nausea or vomiting. Patient not taking: No sig reported 08/15/20 10/19/20  Wieters, Hallie C, PA-C  sucralfate (CARAFATE) 1 g tablet Take 1 tablet (1 g total) by mouth 4 (four) times daily -  with meals and at bedtime. Patient not taking: No sig reported 04/24/20 10/19/20  Molpus, Jenny Reichmann, MD    Allergies    Patient has no known allergies.  Review of Systems   Review of Systems  Constitutional:  Negative for chills and fever.  Eyes:  Negative for visual  disturbance.  Respiratory:  Negative for shortness of breath.   Cardiovascular:  Negative for chest pain.  Gastrointestinal:  Positive for abdominal pain, diarrhea and nausea. Negative for abdominal distention, anal bleeding, blood in stool, constipation, rectal pain and vomiting.  Genitourinary:  Negative for difficulty urinating, dysuria, flank pain, frequency, genital sores, hematuria, pelvic pain, vaginal bleeding, vaginal discharge and vaginal pain.  Musculoskeletal:  Negative for back pain and neck pain.  Skin:  Negative for color change and rash.  Neurological:  Negative for dizziness, syncope, light-headedness and headaches.  Psychiatric/Behavioral:  Negative for confusion.    Physical Exam Updated Vital Signs BP (!) 148/94 (BP Location: Right Arm)   Pulse 95   Temp 98.3 F (36.8 C) (Oral)   Resp 19   Ht 5\' 4"  (1.626 m)   Wt 94.8 kg   SpO2 100%   BMI 35.87 kg/m   Physical Exam Vitals and nursing note reviewed.  Constitutional:      General: She is not in acute distress.    Appearance: She is not ill-appearing, toxic-appearing or diaphoretic.     Comments: Appears uncomfortable with tearful affect due to complaints of abdominal pain  HENT:     Head: Normocephalic.  Eyes:     General: No scleral icterus.       Right eye: No discharge.        Left eye: No discharge.  Cardiovascular:     Rate and Rhythm: Normal rate.  Pulmonary:     Effort: Pulmonary effort is normal. No tachypnea, bradypnea or respiratory distress.     Breath sounds: Normal breath sounds. No stridor.  Abdominal:     General: Bowel sounds are normal. There is no distension. There are no signs of injury.     Palpations: Abdomen is soft. There is no mass or pulsatile mass.     Tenderness: There is abdominal tenderness in the epigastric area. There is no right CVA tenderness, left CVA tenderness, guarding or rebound.     Hernia: There is no hernia in the umbilical area or ventral area.  Skin:     General: Skin is warm  and dry.  Neurological:     General: No focal deficit present.     Mental Status: She is alert.     GCS: GCS eye subscore is 4. GCS verbal subscore is 5. GCS motor subscore is 6.  Psychiatric:        Behavior: Behavior is cooperative.    ED Results / Procedures / Treatments   Labs (all labs ordered are listed, but only abnormal results are displayed) Labs Reviewed  LIPASE, BLOOD - Abnormal; Notable for the following components:      Result Value   Lipase 55 (*)    All other components within normal limits  COMPREHENSIVE METABOLIC PANEL - Abnormal; Notable for the following components:   Sodium 134 (*)    Glucose, Bld 285 (*)    Total Protein 8.2 (*)    All other components within normal limits  CBC - Abnormal; Notable for the following components:   WBC 10.6 (*)    RBC 5.27 (*)    All other components within normal limits  URINALYSIS, ROUTINE W REFLEX MICROSCOPIC - Abnormal; Notable for the following components:   Specific Gravity, Urine 1.038 (*)    Glucose, UA >=500 (*)    Ketones, ur 80 (*)    Bacteria, UA RARE (*)    All other components within normal limits  RESP PANEL BY RT-PCR (FLU A&B, COVID) [id number]  I-STAT BETA HCG BLOOD, ED ([facility], [facility], [facility] ONLY)    EKG None  Radiology CT ABDOMEN PELVIS W CONTRAST  Result Date: 06/22/2021 CLINICAL DATA:  Epigastric pain, vomiting EXAM: CT ABDOMEN AND PELVIS WITH CONTRAST TECHNIQUE: Multidetector CT imaging of the abdomen and pelvis was performed using the standard protocol following bolus administration of intravenous contrast. CONTRAST:  67mL OMNIPAQUE IOHEXOL 350 MG/ML SOLN COMPARISON:  04/20/2021 FINDINGS: Lower chest: Minimal dependent atelectasis. Heart size within normal limits. Hepatobiliary: No focal liver abnormality is seen. Status post cholecystectomy. No biliary dilatation. Pancreas: Unremarkable. No pancreatic ductal dilatation or surrounding inflammatory changes. Spleen: Normal in size without focal  abnormality. Adrenals/Urinary Tract: Stable small left adrenal gland nodules. Unremarkable right adrenal gland. Kidneys enhance symmetrically without focal lesion, stone, or hydronephrosis. Ureters are nondilated. Urinary bladder appears unremarkable. Stomach/Bowel: Mild wall thickening in the region of the gastric antrum and pylorus. No dilated loops of bowel. Normal appendix is present in the right lower quadrant (series 2, image 46). No focal colonic wall thickening or inflammatory changes. Vascular/Lymphatic: Scattered aortoiliac atherosclerotic calcifications without aneurysm. No abdominopelvic lymphadenopathy. Reproductive: Status post hysterectomy. No adnexal masses. Other: Rectus diastasis with small fat containing periumbilical hernia. No ascites. No abdominopelvic fluid collection. No pneumoperitoneum. Musculoskeletal: Prior L4-5 and L5-S1 interbody fusion. No acute bony findings. Degenerative changes of both hips. A IMPRESSION: 1. Mild wall thickening in the region of the gastric antrum and pylorus may represent gastritis. 2. Otherwise, no acute abdominopelvic findings. Aortic Atherosclerosis (ICD10-I70.0). Electronically Signed   By: Davina Poke D.O.   On: 06/22/2021 09:14    Procedures Procedures   Medications Ordered in ED Medications  alum & mag hydroxide-simeth (MAALOX/MYLANTA) 200-200-20 MG/5ML suspension 30 mL (30 mLs Oral Patient Refused/Not Given 06/22/21 0925)    And  lidocaine (XYLOCAINE) 2 % viscous mouth solution 15 mL (15 mLs Oral Patient Refused/Not Given 06/22/21 0925)  morphine 4 MG/ML injection 4 mg (4 mg Intravenous Given 06/22/21 0652)  ondansetron (ZOFRAN) injection 4 mg (4 mg Intravenous Given 06/22/21 0651)  pantoprazole (PROTONIX) injection 40 mg (40 mg Intravenous Given 06/22/21 0756)  HYDROmorphone (DILAUDID) injection 1 mg (1 mg Intravenous Given 06/22/21 0758)  iohexol (OMNIPAQUE) 350 MG/ML injection 100 mL (75 mLs Intravenous Contrast Given 06/22/21 0849)   ketorolac (TORADOL) 15 MG/ML injection 15 mg (15 mg Intravenous Given 06/22/21 1119)    ED Course  I have reviewed the triage vital signs and the nursing notes.  Pertinent labs & imaging results that were available during my care of the patient were reviewed by me and considered in my medical decision making (see chart for details).    MDM Rules/Calculators/A&P                           Alert 55 year old female no acute distress, nontoxic-appearing.  Patient is uncomfortable with tearful affect due to complaints of abdominal pain.  Patient endorses epigastric abdominal pain, nausea, and vomiting.  Symptoms intermittent since Tuesday.  Per chart review patient has been seen multiple times in emergency department for similar complaints.  Most recently patient seen 04/20/2021 with reassuring work-up and CT scan of abdomen pelvis which showed no acute abnormalities.  On exam abdomen soft, nondistended, mild epigastric tenderness.  No guarding or rebound tenderness.  No CVA tenderness.  Will obtain CBC, CMP, lipase, urinalysis, lab work and respiratory panel.  Plan to give patient morphine, Zofran, fluid bolus, and Protonix.  CBC shows mild leukocytosis at 10.6; suspect this is due to acute phase reactant from patient's abdominal pain, nausea, and vomiting. Lipase slightly elevated at 55. CMP shows hyperglycemia at 285; bicarb and anion gap within normal limits, low suspicion for DKA at this time. Pregnancy test negative. Urinalysis shows no signs of infection.  The patient is slightly elevated lipase and epigastric tenderness will obtain CT abdomen pelvis.  CT abdomen pelvis shows mild wall thickening in the region of the gastric antrum and pylorus may represent gastritis.  No other acute findings in abdomen or pelvis.  Patient reports improvement in symptoms after receiving pain management.  Patient able to tolerate p.o. intake at this time.  Abdomen soft, nondistended, improved tenderness  to epigastric area.  No guarding or rebound tenderness.  Prescribed patient with Carafate and Protonix.  Patient to follow-up with her gastroenterologist.  Patient reports that she has prescription for antiemetics.  Discussed results, findings, treatment and follow up. Patient advised of return precautions. Patient verbalized understanding and agreed with plan.  Patient care discussed with attending physician Dr. Langston Masker.  Final Clinical Impression(s) / ED Diagnoses Final diagnoses:  None    Rx / DC Orders ED Discharge Orders     None        Loni Beckwith, PA-C 06/22/21 1147    Wyvonnia Dusky, MD 06/22/21 1345

## 2021-07-17 ENCOUNTER — Encounter (HOSPITAL_COMMUNITY): Payer: Self-pay

## 2021-07-17 ENCOUNTER — Other Ambulatory Visit: Payer: Self-pay

## 2021-07-17 ENCOUNTER — Emergency Department (HOSPITAL_COMMUNITY)
Admission: EM | Admit: 2021-07-17 | Discharge: 2021-07-17 | Disposition: A | Discharge status: Home / Self Care | Payer: Medicare Other | Attending: Emergency Medicine | Admitting: Emergency Medicine

## 2021-07-17 DIAGNOSIS — F17210 Nicotine dependence, cigarettes, uncomplicated: Secondary | ICD-10-CM | POA: Insufficient documentation

## 2021-07-17 DIAGNOSIS — R1012 Left upper quadrant pain: Secondary | ICD-10-CM | POA: Diagnosis not present

## 2021-07-17 DIAGNOSIS — Z7984 Long term (current) use of oral hypoglycemic drugs: Secondary | ICD-10-CM | POA: Insufficient documentation

## 2021-07-17 DIAGNOSIS — I10 Essential (primary) hypertension: Secondary | ICD-10-CM | POA: Insufficient documentation

## 2021-07-17 DIAGNOSIS — R000 Tachycardia, unspecified: Secondary | ICD-10-CM | POA: Insufficient documentation

## 2021-07-17 DIAGNOSIS — R1013 Epigastric pain: Secondary | ICD-10-CM | POA: Diagnosis not present

## 2021-07-17 DIAGNOSIS — E119 Type 2 diabetes mellitus without complications: Secondary | ICD-10-CM | POA: Insufficient documentation

## 2021-07-17 DIAGNOSIS — Z79899 Other long term (current) drug therapy: Secondary | ICD-10-CM | POA: Insufficient documentation

## 2021-07-17 DIAGNOSIS — Z794 Long term (current) use of insulin: Secondary | ICD-10-CM | POA: Insufficient documentation

## 2021-07-17 DIAGNOSIS — R112 Nausea with vomiting, unspecified: Secondary | ICD-10-CM

## 2021-07-17 LAB — CBC WITH DIFFERENTIAL/PLATELET
Abs Immature Granulocytes: 0.03 10*3/uL (ref 0.00–0.07)
Basophils Absolute: 0.1 10*3/uL (ref 0.0–0.1)
Basophils Relative: 1 %
Eosinophils Absolute: 0 10*3/uL (ref 0.0–0.5)
Eosinophils Relative: 0 %
HCT: 46 % (ref 36.0–46.0)
Hemoglobin: 15.1 g/dL — ABNORMAL HIGH (ref 12.0–15.0)
Immature Granulocytes: 0 %
Lymphocytes Relative: 15 %
Lymphs Abs: 1.6 10*3/uL (ref 0.7–4.0)
MCH: 28.1 pg (ref 26.0–34.0)
MCHC: 32.8 g/dL (ref 30.0–36.0)
MCV: 85.5 fL (ref 80.0–100.0)
Monocytes Absolute: 0.3 10*3/uL (ref 0.1–1.0)
Monocytes Relative: 3 %
Neutro Abs: 8.8 10*3/uL — ABNORMAL HIGH (ref 1.7–7.7)
Neutrophils Relative %: 81 %
Platelets: 342 10*3/uL (ref 150–400)
RBC: 5.38 MIL/uL — ABNORMAL HIGH (ref 3.87–5.11)
RDW: 13.5 % (ref 11.5–15.5)
WBC: 10.9 10*3/uL — ABNORMAL HIGH (ref 4.0–10.5)
nRBC: 0 % (ref 0.0–0.2)

## 2021-07-17 LAB — COMPREHENSIVE METABOLIC PANEL
ALT: 19 U/L (ref 0–44)
AST: 16 U/L (ref 15–41)
Albumin: 4.6 g/dL (ref 3.5–5.0)
Alkaline Phosphatase: 112 U/L (ref 38–126)
Anion gap: 13 (ref 5–15)
BUN: 7 mg/dL (ref 6–20)
CO2: 25 mmol/L (ref 22–32)
Calcium: 9.6 mg/dL (ref 8.9–10.3)
Chloride: 99 mmol/L (ref 98–111)
Creatinine, Ser: 0.67 mg/dL (ref 0.44–1.00)
GFR, Estimated: >60 mL/min (ref >60)
Glucose, Bld: 275 mg/dL — ABNORMAL HIGH (ref 70–99)
Potassium: 4.1 mmol/L (ref 3.5–5.1)
Sodium: 137 mmol/L (ref 135–145)
Total Bilirubin: 0.7 mg/dL (ref 0.3–1.2)
Total Protein: 8 g/dL (ref 6.5–8.1)

## 2021-07-17 LAB — CBG MONITORING, ED: Glucose-Capillary: 264 mg/dL — ABNORMAL HIGH (ref 70–99)

## 2021-07-17 LAB — LIPASE, BLOOD: Lipase: 55 U/L — ABNORMAL HIGH (ref 11–51)

## 2021-07-17 MED ORDER — METOCLOPRAMIDE HCL 5 MG/ML IJ SOLN
10.0000 mg | Freq: Once | INTRAMUSCULAR | Status: AC
  Administered 2021-07-17: 10 mg via INTRAVENOUS
  Filled 2021-07-17: qty 2

## 2021-07-17 MED ORDER — LACTATED RINGERS IV BOLUS
1000.0000 mL | Freq: Once | INTRAVENOUS | Status: AC
  Administered 2021-07-17: 1000 mL via INTRAVENOUS

## 2021-07-17 MED ORDER — HYDROMORPHONE HCL 1 MG/ML IJ SOLN
1.0000 mg | Freq: Once | INTRAMUSCULAR | Status: AC
  Administered 2021-07-17: 1 mg via INTRAVENOUS
  Filled 2021-07-17: qty 1

## 2021-07-17 NOTE — ED Notes (Signed)
RN gave pt Reglan in right deltoid and Dilaudid in left deltoid IM per Maryan Rued, MD request

## 2021-07-17 NOTE — Discharge Instructions (Signed)
Your lab work looks ok today you were dehydrated but you got fluids.  Stick with broth over the next few days and continue to take your current medications.  It is important that you have more testing on your stomach to find the cause of the pain and vomiting.

## 2021-07-17 NOTE — ED Triage Notes (Signed)
Patient arrived by [facility] for ongoing abdominal pain with vomiting. States that she is having this regular and has been worked up for same with no diagnosis. Complains of diarrhea occasional with same

## 2021-07-17 NOTE — ED Provider Notes (Signed)
[facility] Provider Note   CSN: [id number] Arrival date & time: 07/17/21  1523     History No chief complaint on file.   [name] is a 55 y.o. female.  Pt is a 55y/o female with hx of hypertension, diabetes mellitus, GERD, pancreatitis, recurrent abd pain who is presenting today with complaints of upper abdominal pain, nausea and vomiting.  Patient was seen here last on 06/19/2021 and she reports since that time the pain never really went away but it was tolerable until yesterday when it became more severe with worsening nausea and vomiting.  She has been taking all of her medications at home or at least trying to but it is not helping.  She denies any blood in her emesis.  She occasionally has diarrhea but denies any currently.  She denies any constipation.  She reports any thing she tries to eat or drink makes the pain worse even water bothers her.  Food smells makes her nauseated and makes her vomit.  She reports today's episode feels exactly like all of her prior episodes.  She is planning on going back and seeing [facility] for further evaluation but reports she has had endoscopies in the past and she has never had ulcer disease.  She does not take any aspirin or ibuprofen products.  She does not drink alcohol.  She denies any cough, fever or shortness of breath.       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    Complete tear of right rotator cuff 07/22/2017    Class: Chronic   Retained orthopedic hardware 07/22/2017    Class: Chronic   Cervical disc herniation 07/21/2017    Class: Acute   Status post cervical spinal fusion 07/21/17 07/21/2017   Chronic back pain     Past Surgical History:  Procedure Laterality Date   ABDOMINAL HYSTERECTOMY     ANTERIOR CERVICAL DECOMP/DISCECTOMY FUSION N/A 07/21/2017   Procedure: ANTERIOR CERVICAL DISCECTOMY AND FUSION C6-7, REMOVAL OF SCREW C6 PLATE, DEPUY ZERO-P IMPLANT, LOCAL BONE GRAFT, ALLOGRAFT BONE GRAFT, VIVIGEN;  Surgeon: Jessy Oto, MD;  Location: [facility];  Service: Orthopedics;  Laterality: N/A;   BACK SURGERY     laminectomy   BONE GRAFT HIP ILIAC CREST     BREAST BIOPSY     CARPAL TUNNEL RELEASE     CERVICAL DISCECTOMY  07/21/2017   CESAREAN SECTION     CHOLECYSTECTOMY     COLONOSCOPY     frontal fusion     neck fusion     patient reported   SHOULDER ARTHROSCOPY WITH SUBACROMIAL DECOMPRESSION, ROTATOR CUFF REPAIR AND BICEP TENDON REPAIR Right 10/31/2017   Procedure: RIGHT SHOULDER ARTHROSCOPY, MANIPULATION UNDER ANESTHESIA, BICEPS TENODESIS, AND MINI OPEN ROTATOR CUFF TEAR REPAIR;  Surgeon: Meredith Pel, MD;  Location: [facility];  Service: Orthopedics;  Laterality: Right;     OB History   No obstetric history on file.     Family History  Problem Relation Age of Onset   Hypertension Father    Renal Disease Father    Diabetes Mother    Hypertension Mother    Edema Mother    Diabetes Sister    Diabetes Brother     Social History   Tobacco Use   Smoking status: Every Day    Packs/day: 0.50    Types: Cigarettes   Smokeless tobacco: Never  Vaping Use   Vaping Use: Never used  Substance Use Topics   Alcohol use: No   Drug use: Not Currently    Types: Marijuana    Home Medications Prior to Admission medications   Medication Sig Start Date End Date Taking? Authorizing Provider  acetaminophen (TYLENOL) 500 MG tablet  Take 500 mg by mouth 2 (two) times daily as needed for moderate pain.    [provider]  albuterol (PROVENTIL HFA;VENTOLIN HFA) 108 (90 BASE) MCG/ACT inhaler Inhale 2 puffs into the lungs every 4 (four) hours as needed for wheezing.    [provider]  albuterol (PROVENTIL) (2.5 MG/3ML) 0.083% nebulizer solution Take 3 mLs (2.5 mg total) by nebulization every 6 (six) hours as needed for wheezing or shortness of breath. 12/12/19   Tasia Catchings, Amy V, PA-C  ALPRAZolam Duanne Moron) 1 MG tablet Take 1 mg by mouth daily as needed for anxiety. 10/23/19   [provider]  amLODipine-olmesartan (AZOR) 5-40 MG tablet Take 1 tablet by mouth daily. 06/05/21   [provider]  cetirizine (ZYRTEC) 10 MG tablet Take 10 mg by mouth daily as needed for allergies. 09/27/20   [provider]  cyclobenzaprine (FLEXERIL) 10 MG tablet Take 1 tablet (10 mg total) by mouth 2 (two) times daily as needed for muscle spasms. 11/27/20   Jessy Oto, MD  dicyclomine (BENTYL) 20 MG tablet Take 1 tablet (20 mg total) by mouth 2 (two) times daily. 04/20/21   Henderly, Britni A, PA-C  escitalopram (LEXAPRO) 20 MG tablet Take 20 mg by mouth daily as needed (depression). 08/10/20   [provider]  fluticasone (FLONASE) 50 MCG/ACT nasal spray Place 1 spray into both nostrils daily as needed for allergies.    [provider]  insulin glargine (LANTUS) 100 unit/mL SOPN Inject 50 Units into the skin daily at 2 PM.    [provider]  JANUVIA 100 MG tablet Take 100 mg by mouth daily. 08/23/20   [provider]  metFORMIN (GLUCOPHAGE) 1000 MG tablet Take 1,000 mg by mouth 2 (two) times daily. 03/26/21   [provider]  metoCLOPramide (REGLAN) 10 MG tablet Take 1 tablet (10 mg total) by mouth every 6 (six) hours. Patient not taking: Reported on 06/22/2021 04/20/21   Henderly, Britni A, PA-C  metoprolol succinate (TOPROL-XL) 50 MG 24 hr tablet Take 50 mg by mouth daily.  07/21/14   [provider]  Multiple Vitamins-Minerals (CENTRUM PO) Take 1 tablet by mouth daily.    [provider]  ondansetron (ZOFRAN ODT) 4 MG disintegrating tablet Take 1 tablet (4 mg total) by mouth every 8 (eight) hours as needed for nausea or vomiting. 04/20/21   Henderly, Britni A, PA-C  pantoprazole (PROTONIX) 20 MG tablet Take 1 tablet (20 mg total) by mouth daily. 06/22/21 07/22/21  Loni Beckwith, PA-C  pregabalin (LYRICA) 75 MG capsule Take 1 capsule (75 mg total) by mouth 2 (two) times daily.  Patient taking differently: Take 75 mg by mouth 2 (two) times daily as needed (neuropathy). 02/20/21   Sater, Nanine Means, MD  simvastatin (ZOCOR) 20 MG tablet Take 20 mg by mouth daily. 08/22/20   [provider]  sucralfate (CARAFATE) 1 g tablet Take 1 tablet (1 g total) by mouth 4 (four) times daily -  with meals and at bedtime. 06/22/21 07/22/21  Loni Beckwith, PA-C  traMADol (ULTRAM) 50 MG tablet Take 0.5-1 tablets (25-50 mg total) by mouth 2 (two) times daily as needed. Patient taking differently: Take 25-50 mg by mouth 2 (two) times daily as needed for moderate pain. 11/27/20   Jessy Oto, MD  promethazine (PHENERGAN) 25 MG tablet Take 1 tablet (25 mg total) by mouth every 6 (six) hours as needed for nausea or vomiting. Patient not taking: No sig reported 08/15/20 10/19/20  Wieters, Madelynn Done C, PA-C    Allergies    Patient has no known allergies.  Review of Systems   Review of Systems  All other systems reviewed and are negative.  Physical Exam Updated Vital Signs BP (!) 150/83 (BP Location: Left Arm)   Pulse 99   Temp 98.5 F (36.9 C) (Oral)   Resp 17   SpO2 96%   Physical Exam Vitals and nursing note reviewed.  Constitutional:      General: She is not in acute distress.    Appearance: She is well-developed.  HENT:     Head: Normocephalic and atraumatic.     Mouth/Throat:     Mouth: Mucous membranes are dry.  Eyes:      Conjunctiva/sclera: Conjunctivae normal.     Pupils: Pupils are equal, round, and reactive to light.  Cardiovascular:     Rate and Rhythm: Regular rhythm. Tachycardia present.     Heart sounds: No murmur heard. Pulmonary:     Effort: Pulmonary effort is normal. No respiratory distress.     Breath sounds: Normal breath sounds. No wheezing or rales.  Abdominal:     General: There is no distension.     Palpations: Abdomen is soft.     Tenderness: There is abdominal tenderness in the epigastric area, periumbilical area and left upper quadrant. There is guarding. There is no rebound.  Musculoskeletal:        General: No tenderness. Normal range of motion.     Cervical back: Normal range of motion and neck supple.     Right lower leg: No edema.     Left lower leg: No edema.  Skin:    General: Skin is warm and dry.     Findings: No erythema or rash.  Neurological:     Mental Status: She is alert and oriented to person, place, and time.  Psychiatric:        Behavior: Behavior normal.    ED Results / Procedures / Treatments   Labs (all labs ordered are listed, but only abnormal results are displayed) Labs Reviewed  CBC WITH DIFFERENTIAL/PLATELET - Abnormal; Notable for the following components:      Result Value   WBC 10.9 (*)    RBC 5.38 (*)    Hemoglobin 15.1 (*)    Neutro Abs 8.8 (*)    All other components within normal limits  COMPREHENSIVE METABOLIC PANEL - Abnormal; Notable for the following components:   Glucose, Bld 275 (*)    All other components within normal limits  LIPASE, BLOOD - Abnormal; Notable for the following components:   Lipase 55 (*)  All other components within normal limits  CBG MONITORING, ED - Abnormal; Notable for the following components:   Glucose-Capillary 264 (*)    All other components within normal limits  URINALYSIS, ROUTINE W REFLEX MICROSCOPIC    EKG None  Radiology No results found.  Procedures Procedures   Medications Ordered  in ED Medications  lactated ringers bolus 1,000 mL (has no administration in time range)  HYDROmorphone (DILAUDID) injection 1 mg (has no administration in time range)  metoCLOPramide (REGLAN) injection 10 mg (has no administration in time range)    ED Course  I have reviewed the triage vital signs and the nursing notes.  Pertinent labs & imaging results that were available during my care of the patient were reviewed by me and considered in my medical decision making (see chart for details).    MDM Rules/Calculators/A&P                           Patient is a 55 year old female who has been to the emergency room multiple times in the past for for similar symptoms who is presenting today with epigastric discomfort.  She is mildly tachycardic here and appears dehydrated.  She does have significant upper abdominal pain.  She has had prior cholecystectomy and does have a history of recurrent pancreatitis but lipase today is only 55.  No evidence of DKA today.  BC was stable hemoglobin and white count of 10.  Renal function is normal.  She had a CT done approximately 1 month ago for similar complaints and it showed mild wall thickening in the region of the gastric antrum and pylorus which might be gastritis.  She has been taking Protonix and sucralfate.  She will be treated supportively and symptomatically today.  She does not appear to need a new scan and does need to follow-up with GI in the future.  10:26 PM Pt tolerating po's and feeling better.  D/ced home for gi f/u.  MDM   Amount and/or Complexity of Data Reviewed Clinical lab tests: ordered and reviewed Tests in the medicine section of CPT: ordered and reviewed Independent visualization of images, tracings, or specimens: yes     Final Clinical Impression(s) / ED Diagnoses Final diagnoses:  Epigastric pain  Nausea and vomiting, unspecified vomiting type    Rx / DC Orders ED Discharge Orders     None        Blanchie Dessert, MD 07/17/21 2227

## 2021-07-17 NOTE — ED Notes (Signed)
RN gave pt diet sprite and crackers

## 2021-07-17 NOTE — ED Provider Notes (Signed)
Emergency Medicine Provider Triage Evaluation Note  [name] , a 55 y.o. female  was evaluated in triage.  Pt complains of acute on chronic upper abdominal pain that started 3 days ago.  Abdominal pain associate with nausea, vomiting, and diarrhea.  Patient evaluated few times in the ED for the same complaint with reassuring work-up.  CT abdomen scan performed on 11/11 which demonstrated mild wall thickening in the region of the gastric antrum and pylorus which may represent gastritis.  No fever or chills.  No urinary or vaginal symptoms.  Review of Systems  Positive: Abdominal pain, N/V/D Negative: fever  Physical Exam  BP (!) 157/85 (BP Location: Left Arm)   Pulse (!) 102   Temp 98.5 F (36.9 C) (Oral)   Resp 16   SpO2 95%  Gen:   Awake, no distress   Resp:  Normal effort  MSK:   Moves extremities without difficulty  Other:  TTP in upper quadrants, but difficult to assess fully because patient full my hand away during my exam  Medical Decision Making  Medically screening exam initiated at 4:22 PM.  Appropriate orders placed.  [name] was informed that the remainder of the evaluation will be completed by another provider, this initial triage assessment does not replace that evaluation, and the importance of remaining in the ED until their evaluation is complete.  Abdominal pain with recent CT abdomen on 11/11. Labs ordered. Will defer imaging to provider in back given recent CT scan.    Karie Kirks 07/17/21 1624    Blanchie Dessert, MD 07/17/21 2139

## 2021-07-18 ENCOUNTER — Other Ambulatory Visit: Payer: Self-pay | Admitting: Internal Medicine

## 2021-07-18 DIAGNOSIS — Z1231 Encounter for screening mammogram for malignant neoplasm of breast: Secondary | ICD-10-CM

## 2021-08-22 ENCOUNTER — Ambulatory Visit
Admission: RE | Admit: 2021-08-22 | Discharge: 2021-08-22 | Disposition: A | Discharge status: Home / Self Care | Payer: Commercial Managed Care - HMO | Source: Ambulatory Visit | Attending: Internal Medicine | Admitting: Internal Medicine

## 2021-08-22 DIAGNOSIS — Z1231 Encounter for screening mammogram for malignant neoplasm of breast: Secondary | ICD-10-CM

## 2021-09-03 ENCOUNTER — Other Ambulatory Visit: Payer: Self-pay

## 2021-09-03 ENCOUNTER — Encounter: Payer: Self-pay | Admitting: Specialist

## 2021-09-03 ENCOUNTER — Ambulatory Visit (INDEPENDENT_AMBULATORY_CARE_PROVIDER_SITE_OTHER): Payer: Medicare Other

## 2021-09-03 ENCOUNTER — Ambulatory Visit (INDEPENDENT_AMBULATORY_CARE_PROVIDER_SITE_OTHER): Payer: Medicare Other | Admitting: Specialist

## 2021-09-03 VITALS — BP 163/83 | HR 61 | Ht 64.0 in | Wt 212.0 lb

## 2021-09-03 DIAGNOSIS — M4316 Spondylolisthesis, lumbar region: Secondary | ICD-10-CM | POA: Diagnosis not present

## 2021-09-03 DIAGNOSIS — M4807 Spinal stenosis, lumbosacral region: Secondary | ICD-10-CM

## 2021-09-03 DIAGNOSIS — G5602 Carpal tunnel syndrome, left upper limb: Secondary | ICD-10-CM

## 2021-09-03 DIAGNOSIS — M545 Low back pain, unspecified: Secondary | ICD-10-CM

## 2021-09-03 DIAGNOSIS — M5412 Radiculopathy, cervical region: Secondary | ICD-10-CM

## 2021-09-03 DIAGNOSIS — Z981 Arthrodesis status: Secondary | ICD-10-CM

## 2021-09-03 DIAGNOSIS — M5030 Other cervical disc degeneration, unspecified cervical region: Secondary | ICD-10-CM

## 2021-09-03 MED ORDER — GABAPENTIN 100 MG PO CAPS
100.0000 mg | ORAL_CAPSULE | Freq: Every day | ORAL | 1 refills | Status: DC
Start: 2021-09-03 — End: 2021-09-14

## 2021-09-03 MED ORDER — ALPRAZOLAM 0.5 MG PO TABS: ORAL_TABLET | ORAL | 0 refills | Status: DC

## 2021-09-03 MED ORDER — CELECOXIB 200 MG PO CAPS
200.0000 mg | ORAL_CAPSULE | Freq: Two times a day (BID) | ORAL | 2 refills | Status: DC
Start: 2021-09-03 — End: 2021-10-20

## 2021-09-03 NOTE — Patient Instructions (Signed)
Avoid overhead lifting and overhead use of the arms. Do not lift greater than 5 lbs. Adjust head rest in vehicle to prevent hyperextension if rear ended. Take extra precautions to avoid falling. Avoid bending, stooping and avoid lifting weights greater than 10 lbs. Avoid prolong standing and walking. Avoid frequent bending and stooping  No lifting greater than 10 lbs. May use ice or moist heat for pain. Weight loss is of benefit. Handicap license is approved.

## 2021-09-03 NOTE — Progress Notes (Signed)
Office Visit Note   [name]           Date of Birth: 08/15/1965           MRN: [id number] Visit Date: 09/03/2021              Requested by: Nolene Ebbs, MD [address] [place],  [place] [address] PCP: Nolene Ebbs, MD   Assessment & Plan: Visit Diagnoses:  1. Spinal stenosis of lumbosacral region   2. Low back pain, unspecified back pain laterality, unspecified chronicity, unspecified whether sciatica present   3. Radiculopathy, cervical region   4. Spondylolisthesis of lumbar region   5. History of fusion of cervical spine   6. History of fusion of lumbar spine     Plan: Avoid overhead lifting and overhead use of the arms. Do not lift greater than 5 lbs. Adjust head rest in vehicle to prevent hyperextension if rear ended. Take extra precautions to avoid falling. Avoid bending, stooping and avoid lifting weights greater than 10 lbs. Avoid prolong standing and walking. Avoid frequent bending and stooping  No lifting greater than 10 lbs. May use ice or moist heat for pain. Weight loss is of benefit. Handicap license is approved.  Follow-Up Instructions: Return in about 4 weeks (around 10/01/2021).   Orders:  Orders Placed This Encounter  Procedures   XR Cervical Spine 2 or 3 views   XR Lumbar Spine 2-3 Views   No orders of the defined types were placed in this encounter.     Procedures: No procedures performed   Clinical Data: No additional findings.   Subjective: Chief Complaint  Patient presents with   Neck - Pain   Lower Back - Pain    56 year old right handed female with history of C5-6 and C6-7 ACDF, last surgery 2018 zero implant at the C6-7 level. She has been experiencing pain into the neck and numbness and tingling into the left hand and on the right side it goes down into the right elbow. No bowel or bladder difficulty. She is able to walk okay at times but she has more pain with sitting and has to adjust when she  gets up. There in both the neck and low back. There is a pain like a rubberband at the neck. The lower back is a constant pain and throbbing. Pain with sitting and bending and stooping. In the past has had anterior lumbar fusion done for area that had previous surgery By Dr. Laverta Baltimore years ago. Has weakness in her arms and legs. She is on disability, since this happened, older she gets the worse she feels and feel like her situation is getting worse. History of previous right CTR and has seen a neurologist, Dr. Mariea Stable earlier this year and he ordered EMG/NCV. She did not have these done and has not rescheduled do to avoiding needle poke.   Review of Systems  Constitutional:  Positive for activity change and unexpected weight change.  HENT:  Negative for congestion, dental problem, drooling, ear discharge, ear pain, facial swelling, hearing loss, mouth sores, nosebleeds, postnasal drip, rhinorrhea, sinus pressure, sinus pain, sneezing, sore throat, tinnitus, trouble swallowing and voice change.   Eyes:  Negative for visual disturbance.  Respiratory: Negative.  Negative for apnea, choking, chest tightness and shortness of breath.   Cardiovascular:  Positive for chest pain. Negative for palpitations and leg swelling.  Gastrointestinal:  Positive for abdominal pain, diarrhea, nausea and vomiting. Negative for abdominal distention, anal bleeding,  blood in stool, constipation and rectal pain.  Endocrine: Negative.  Negative for cold intolerance, heat intolerance, polydipsia, polyphagia and polyuria.  Genitourinary: Negative.  Negative for decreased urine volume, difficulty urinating, dyspareunia, dysuria, enuresis, flank pain, frequency, genital sores, hematuria, menstrual problem, pelvic pain, urgency, vaginal bleeding, vaginal discharge and vaginal pain.  Musculoskeletal:  Positive for back pain, gait problem, neck pain and neck stiffness.  Skin: Negative.  Negative for color change, pallor, rash and wound.   Allergic/Immunologic: Negative.  Negative for environmental allergies, food allergies and immunocompromised state.  Neurological:  Positive for weakness and numbness. Negative for dizziness, tremors, seizures, syncope, facial asymmetry, speech difficulty, light-headedness and headaches.  Hematological: Negative.  Negative for adenopathy. Does not bruise/bleed easily.  Psychiatric/Behavioral: Negative.      Objective: Vital Signs: BP (!) 163/83 (BP Location: Right Arm, Patient Position: Sitting)    Pulse 61    Ht 5\' 4"  (1.626 m)    Wt 212 lb (96.2 kg)    BMI 36.39 kg/m   Physical Exam Constitutional:      Appearance: She is well-developed.  HENT:     Head: Normocephalic and atraumatic.  Eyes:     Pupils: Pupils are equal, round, and reactive to light.  Pulmonary:     Effort: Pulmonary effort is normal.     Breath sounds: Normal breath sounds.  Abdominal:     General: Bowel sounds are normal.     Palpations: Abdomen is soft.  Musculoskeletal:     Cervical back: Normal range of motion and neck supple.     Lumbar back: Negative right straight leg raise test and negative left straight leg raise test.  Skin:    General: Skin is warm and dry.  Neurological:     Mental Status: She is alert and oriented to person, place, and time.  Psychiatric:        Behavior: Behavior normal.        Thought Content: Thought content normal.        Judgment: Judgment normal.   Back Exam   Tenderness  The patient is experiencing tenderness in the cervical and lumbar.  Range of Motion  Extension:  abnormal  Flexion:  abnormal  Lateral bend right:  abnormal  Lateral bend left:  abnormal  Rotation right:  abnormal  Rotation left:  abnormal   Muscle Strength  Right Quadriceps:  5/5  Left Quadriceps:  5/5  Right Hamstrings:  5/5  Left Hamstrings:  5/5   Tests  Straight leg raise right: negative Straight leg raise left: negative  Reflexes  Patellar:  2/4 Achilles:  2/4 Biceps:   2/4 Babinski's sign: normal   Other  Toe walk: normal Heel walk: normal Sensation: decreased Gait: normal   Comments:  Painful ROM Pain with abduction against resistance.    Left Hand Exam   Tenderness  The patient is experiencing tenderness in the palmar area.   Range of Motion  Wrist  Extension:  normal  Flexion:  normal  Pronation:  normal   Muscle Strength  The patient has normal left wrist strength. Wrist flexion: 5/5  Grip:  5/5   Tests  Phalens sign: positive Tinel's sign (median nerve): positive  Other  Erythema: absent Scars: present Sensation: decreased Pulse: present  Comments:  Left hand CTS symptoms     Specialty Comments:  No specialty comments available.  Imaging: XR Cervical Spine 2 or 3 views  Result Date: 09/03/2021 AP and lateral flexion and extension radiographs show previous ACDF  C5-6 with plate and screws and C6-7 with Zero P implant no motion noted across these segments. There is minimal narrowing of the disc at C4-5 with anterior syndesmosises at the remaining 3 levels above the fusion site. No acute findings.   XR Lumbar Spine 2-3 Views  Result Date: 09/03/2021 AP and lateral flexion and extension radiographs show ray anterior cage fusions at the L4-5 and L5-S1 levels With small clips from previous anterior approach to fusion. With flexion and extension there is no motion across these Levels with anterior sentinel sign ov bone bridging. The L3-4 level has a spondylolisthesis that is grade 1 and increases from 3-4 mm to nearly 6-7 mm. There is disc height narrowing at this level and endplate sclerosis.     PMFS History: Patient Active Problem List   Diagnosis Date Noted   Complete tear of right rotator cuff 07/22/2017    Priority: High    Class: Chronic   Cervical disc herniation 07/21/2017    Priority: High    Class: Acute   Retained orthopedic hardware 07/22/2017    Priority: Medium     Class: Chronic   Hypertension,  essential 05/29/2020   Abdominal pain 11/23/2019   Acute pancreatitis 11/21/2019   Tennis Must Quervain's syndrome (tenosynovitis) 07/06/2018   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   Status post cervical spinal fusion 07/21/17 07/21/2017   Chronic back pain    Past Medical History:  Diagnosis Date   Anxiety    Arthritis    rheumatoid , osteoarthritis   Bronchitis    Chronic back pain    Depression    Diabetes mellitus    Fatty liver    Fibromyalgia    GERD (gastroesophageal reflux disease)    Headache    Hypertension    Pancreatitis    Pneumonia    Rotator cuff tear, right     Family History  Problem Relation Age of Onset   Hypertension Father    Renal Disease Father    Diabetes Mother    Hypertension Mother    Edema Mother    Diabetes Sister    Diabetes Brother     Past Surgical History:  Procedure Laterality Date   ABDOMINAL HYSTERECTOMY     ANTERIOR CERVICAL DECOMP/DISCECTOMY FUSION N/A 07/21/2017   Procedure: ANTERIOR CERVICAL DISCECTOMY AND FUSION C6-7, REMOVAL OF SCREW C6 PLATE, DEPUY ZERO-P IMPLANT, LOCAL BONE GRAFT, ALLOGRAFT BONE GRAFT, VIVIGEN;  Surgeon: Jessy Oto, MD;  Location: [facility];  Service: Orthopedics;  Laterality: N/A;   BACK SURGERY     laminectomy   BONE GRAFT HIP ILIAC CREST     BREAST BIOPSY     CARPAL TUNNEL RELEASE     CERVICAL DISCECTOMY  07/21/2017   CESAREAN SECTION     CHOLECYSTECTOMY     COLONOSCOPY     frontal fusion     neck fusion     patient reported   SHOULDER ARTHROSCOPY WITH SUBACROMIAL DECOMPRESSION, ROTATOR CUFF REPAIR AND BICEP TENDON REPAIR Right 10/31/2017   Procedure: RIGHT SHOULDER ARTHROSCOPY, MANIPULATION UNDER ANESTHESIA, BICEPS TENODESIS, AND MINI OPEN ROTATOR CUFF TEAR REPAIR;  Surgeon: Meredith Pel, MD;  Location: [facility];  Service: Orthopedics;  Laterality: Right;   Social History   Occupational History   Not on file  Tobacco Use   Smoking status: Every Day    Packs/day: 0.50    Types: Cigarettes   Smokeless  tobacco: Never  Vaping Use   Vaping Use: Never used  Substance and Sexual Activity  Alcohol use: No   Drug use: Not Currently    Types: Marijuana   Sexual activity: Not on file

## 2021-09-09 ENCOUNTER — Encounter (HOSPITAL_COMMUNITY): Payer: Self-pay

## 2021-09-09 ENCOUNTER — Other Ambulatory Visit: Payer: Self-pay

## 2021-09-09 ENCOUNTER — Ambulatory Visit
Admission: EM | Admit: 2021-09-09 | Discharge: 2021-09-09 | Disposition: A | Discharge status: Home / Self Care | Payer: Medicare Other | Attending: Internal Medicine | Admitting: Internal Medicine

## 2021-09-09 ENCOUNTER — Emergency Department (HOSPITAL_COMMUNITY): Payer: Medicare Other

## 2021-09-09 ENCOUNTER — Emergency Department (HOSPITAL_COMMUNITY)
Admission: EM | Admit: 2021-09-09 | Discharge: 2021-09-09 | Disposition: A | Discharge status: Home / Self Care | Payer: Medicare Other | Attending: Emergency Medicine | Admitting: Emergency Medicine

## 2021-09-09 DIAGNOSIS — R42 Dizziness and giddiness: Secondary | ICD-10-CM | POA: Diagnosis not present

## 2021-09-09 DIAGNOSIS — E1169 Type 2 diabetes mellitus with other specified complication: Secondary | ICD-10-CM | POA: Diagnosis present

## 2021-09-09 DIAGNOSIS — Z20822 Contact with and (suspected) exposure to covid-19: Secondary | ICD-10-CM | POA: Insufficient documentation

## 2021-09-09 DIAGNOSIS — A084 Viral intestinal infection, unspecified: Secondary | ICD-10-CM

## 2021-09-09 DIAGNOSIS — F17210 Nicotine dependence, cigarettes, uncomplicated: Secondary | ICD-10-CM | POA: Diagnosis not present

## 2021-09-09 DIAGNOSIS — Z6836 Body mass index (BMI) 36.0-36.9, adult: Secondary | ICD-10-CM

## 2021-09-09 DIAGNOSIS — R112 Nausea with vomiting, unspecified: Secondary | ICD-10-CM | POA: Insufficient documentation

## 2021-09-09 DIAGNOSIS — E669 Obesity, unspecified: Secondary | ICD-10-CM | POA: Diagnosis present

## 2021-09-09 DIAGNOSIS — D72829 Elevated white blood cell count, unspecified: Secondary | ICD-10-CM | POA: Insufficient documentation

## 2021-09-09 DIAGNOSIS — R1084 Generalized abdominal pain: Secondary | ICD-10-CM | POA: Diagnosis not present

## 2021-09-09 DIAGNOSIS — R197 Diarrhea, unspecified: Secondary | ICD-10-CM

## 2021-09-09 DIAGNOSIS — I10 Essential (primary) hypertension: Secondary | ICD-10-CM | POA: Diagnosis present

## 2021-09-09 DIAGNOSIS — G8929 Other chronic pain: Secondary | ICD-10-CM | POA: Diagnosis present

## 2021-09-09 DIAGNOSIS — K529 Noninfective gastroenteritis and colitis, unspecified: Secondary | ICD-10-CM

## 2021-09-09 DIAGNOSIS — E6609 Other obesity due to excess calories: Secondary | ICD-10-CM

## 2021-09-09 DIAGNOSIS — M549 Dorsalgia, unspecified: Secondary | ICD-10-CM | POA: Diagnosis present

## 2021-09-09 DIAGNOSIS — F1210 Cannabis abuse, uncomplicated: Secondary | ICD-10-CM | POA: Diagnosis present

## 2021-09-09 DIAGNOSIS — F17200 Nicotine dependence, unspecified, uncomplicated: Secondary | ICD-10-CM | POA: Diagnosis present

## 2021-09-09 LAB — CBC WITH DIFFERENTIAL/PLATELET
Abs Immature Granulocytes: 0.05 10*3/uL (ref 0.00–0.07)
Basophils Absolute: 0 10*3/uL (ref 0.0–0.1)
Basophils Relative: 0 %
Eosinophils Absolute: 0 10*3/uL (ref 0.0–0.5)
Eosinophils Relative: 0 %
HCT: 42.5 % (ref 36.0–46.0)
Hemoglobin: 14.6 g/dL (ref 12.0–15.0)
Immature Granulocytes: 0 %
Lymphocytes Relative: 16 %
Lymphs Abs: 1.8 10*3/uL (ref 0.7–4.0)
MCH: 28.8 pg (ref 26.0–34.0)
MCHC: 34.4 g/dL (ref 30.0–36.0)
MCV: 83.8 fL (ref 80.0–100.0)
Monocytes Absolute: 0.3 10*3/uL (ref 0.1–1.0)
Monocytes Relative: 3 %
Neutro Abs: 9.5 10*3/uL — ABNORMAL HIGH (ref 1.7–7.7)
Neutrophils Relative %: 81 %
Platelets: 329 10*3/uL (ref 150–400)
RBC: 5.07 MIL/uL (ref 3.87–5.11)
RDW: 13.6 % (ref 11.5–15.5)
WBC: 11.7 10*3/uL — ABNORMAL HIGH (ref 4.0–10.5)
nRBC: 0 % (ref 0.0–0.2)

## 2021-09-09 LAB — URINALYSIS, ROUTINE W REFLEX MICROSCOPIC
Bacteria, UA: NONE SEEN
Bilirubin Urine: NEGATIVE
Glucose, UA: >=500 mg/dL — AB
Hgb urine dipstick: NEGATIVE
Ketones, ur: 20 mg/dL — AB
Leukocytes,Ua: NEGATIVE
Nitrite: NEGATIVE
Protein, ur: NEGATIVE mg/dL
Specific Gravity, Urine: 1.029 (ref 1.005–1.030)
pH: 5 (ref 5.0–8.0)

## 2021-09-09 LAB — COMPREHENSIVE METABOLIC PANEL
ALT: 17 U/L (ref 0–44)
AST: 17 U/L (ref 15–41)
Albumin: 4.2 g/dL (ref 3.5–5.0)
Alkaline Phosphatase: 95 U/L (ref 38–126)
Anion gap: 9 (ref 5–15)
BUN: 9 mg/dL (ref 6–20)
CO2: 25 mmol/L (ref 22–32)
Calcium: 9 mg/dL (ref 8.9–10.3)
Chloride: 103 mmol/L (ref 98–111)
Creatinine, Ser: 0.62 mg/dL (ref 0.44–1.00)
GFR, Estimated: >60 mL/min (ref >60)
Glucose, Bld: 244 mg/dL — ABNORMAL HIGH (ref 70–99)
Potassium: 3.9 mmol/L (ref 3.5–5.1)
Sodium: 137 mmol/L (ref 135–145)
Total Bilirubin: 0.5 mg/dL (ref 0.3–1.2)
Total Protein: 7.3 g/dL (ref 6.5–8.1)

## 2021-09-09 LAB — TROPONIN I (HIGH SENSITIVITY): Troponin I (High Sensitivity): 4 ng/L (ref <18)

## 2021-09-09 LAB — RESP PANEL BY RT-PCR (FLU A&B, COVID) ARPGX2
Influenza A by PCR: NEGATIVE
Influenza B by PCR: NEGATIVE
SARS Coronavirus 2 by RT PCR: NEGATIVE

## 2021-09-09 LAB — MAGNESIUM: Magnesium: 1.9 mg/dL (ref 1.7–2.4)

## 2021-09-09 LAB — LIPASE, BLOOD: Lipase: 165 U/L — ABNORMAL HIGH (ref 11–51)

## 2021-09-09 MED ORDER — IOHEXOL 300 MG/ML  SOLN
100.0000 mL | Freq: Once | INTRAMUSCULAR | Status: AC | PRN
  Administered 2021-09-09: 100 mL via INTRAVENOUS

## 2021-09-09 MED ORDER — ONDANSETRON HCL 4 MG/2ML IJ SOLN
4.0000 mg | Freq: Once | INTRAMUSCULAR | Status: AC
  Administered 2021-09-09: 4 mg via INTRAVENOUS
  Filled 2021-09-09: qty 2

## 2021-09-09 MED ORDER — MORPHINE SULFATE (PF) 4 MG/ML IV SOLN
4.0000 mg | Freq: Once | INTRAVENOUS | Status: AC
  Administered 2021-09-09: 4 mg via INTRAVENOUS
  Filled 2021-09-09: qty 1

## 2021-09-09 MED ORDER — ONDANSETRON 4 MG PO TBDP: 4.0000 mg | ORAL_TABLET | Freq: Three times a day (TID) | ORAL | 0 refills | Status: DC | PRN

## 2021-09-09 MED ORDER — SODIUM CHLORIDE 0.9 % IV BOLUS
1000.0000 mL | Freq: Once | INTRAVENOUS | Status: AC
  Administered 2021-09-09: 1000 mL via INTRAVENOUS

## 2021-09-09 MED ORDER — ONDANSETRON HCL 4 MG/2ML IJ SOLN
4.0000 mg | Freq: Once | INTRAMUSCULAR | Status: AC
  Administered 2021-09-09: 4 mg via INTRAMUSCULAR

## 2021-09-09 MED ORDER — LOPERAMIDE HCL 2 MG PO CAPS: 2.0000 mg | ORAL_CAPSULE | Freq: Four times a day (QID) | ORAL | 0 refills | Status: DC | PRN

## 2021-09-09 NOTE — ED Notes (Signed)
Ems at bedside  

## 2021-09-09 NOTE — Assessment & Plan Note (Addendum)
-  Patient with chronic GI issues (IBS vs. Cannabinoid hyperemesis vs. Other) presenting with n/v/d -Labs reassuring -Negative CT -Patient was able to tolerate PO in the ER and has not had further n/v/d since this AM -Based on clinical stability, she appears appropriate for dc to home at this time -She did c/o chest pain and troponin was checked and negative, no current concern for ACS -No change to home meds otherwise

## 2021-09-09 NOTE — Discharge Instructions (Addendum)
Patient sent to hospital via EMS.  

## 2021-09-09 NOTE — ED Triage Notes (Signed)
Pt c/o nausea, vomiting, diarrhea, "feeling clammy." Onset yesterday. Fatigue and malaise.   Not best historian of current meds and allergies.

## 2021-09-09 NOTE — Assessment & Plan Note (Signed)
-  Encourage cessation.   -This was discussed with the patient and should be reviewed on an ongoing basis.   ->3 minutes were spent on tobacco cessation counseling

## 2021-09-09 NOTE — Discharge Instructions (Addendum)
You are seen in the emergency department for abdominal pain nausea vomiting diarrhea.  You had lab work that showed your lipase to be elevated but your CAT scan did not show any evidence of pancreatitis.  We are prescribing you some nausea medication and diarrhea medication.  Please keep well-hydrated and follow-up with your primary care doctor.  Return to the emergency department if any worsening or concerning symptoms

## 2021-09-09 NOTE — ED Notes (Signed)
Pt given ice water for PO challenge. Pt reports nausea is doing much better and reports no feelings of nausea when sipping on water. No episodes of vomiting after multiple sips of water.

## 2021-09-09 NOTE — ED Provider Notes (Signed)
[facility]    CSN: [id number] Arrival date & time: 09/09/21  0855      History   Chief Complaint Chief Complaint  Patient presents with   Diarrhea    HPI [name] is a 56 y.o. female.   Patient presents with nausea, vomiting, diarrhea that started yesterday.  Denies any known fevers.  She reports that she has been exposed to COVID-19 from her family member.  She has been able to keep fluids down but not any foods.  Denies any blood in emesis or stool.  Denies any upper respiratory symptoms.  Patient does have history of epigastric pain which she is being followed by gastrointestinal specialist but reports that this "feels different".   Diarrhea  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   Complete tear of right rotator cuff 07/22/2017    Class: Chronic   Retained orthopedic hardware 07/22/2017    Class: Chronic   Cervical disc herniation 07/21/2017    Class: Acute   Status post cervical spinal fusion 07/21/17 07/21/2017   Chronic back pain     Past Surgical History:  Procedure Laterality Date   ABDOMINAL HYSTERECTOMY     ANTERIOR CERVICAL DECOMP/DISCECTOMY FUSION N/A 07/21/2017   Procedure: ANTERIOR CERVICAL DISCECTOMY AND FUSION C6-7, REMOVAL OF SCREW C6 PLATE, DEPUY ZERO-P IMPLANT, LOCAL BONE GRAFT, ALLOGRAFT BONE GRAFT, VIVIGEN;  Surgeon: Jessy Oto, MD;  Location: [place];  Service: Orthopedics;  Laterality: N/A;   BACK  SURGERY     laminectomy   BONE GRAFT HIP ILIAC CREST     BREAST BIOPSY     CARPAL TUNNEL RELEASE     CERVICAL DISCECTOMY  07/21/2017   CESAREAN SECTION     CHOLECYSTECTOMY     COLONOSCOPY     frontal fusion     neck fusion     patient reported   SHOULDER ARTHROSCOPY WITH SUBACROMIAL DECOMPRESSION, ROTATOR CUFF REPAIR AND BICEP TENDON REPAIR Right 10/31/2017   Procedure: RIGHT SHOULDER ARTHROSCOPY, MANIPULATION UNDER ANESTHESIA, BICEPS TENODESIS, AND MINI OPEN ROTATOR CUFF TEAR REPAIR;  Surgeon: Meredith Pel, MD;  Location: [place];  Service: Orthopedics;  Laterality: Right;    OB History   No obstetric history on file.      Home Medications    Prior to Admission medications   Medication Sig Start Date End Date Taking? Authorizing Provider  acetaminophen (TYLENOL) 500 MG tablet Take 500 mg by mouth 2 (two) times daily as needed for moderate pain.    [provider]  albuterol (PROVENTIL HFA;VENTOLIN HFA) 108 (90 BASE) MCG/ACT inhaler Inhale 2 puffs into the lungs every 4 (four) hours as needed for wheezing.    [provider]  albuterol (PROVENTIL) (2.5 MG/3ML) 0.083% nebulizer solution Take 3 mLs (2.5 mg total) by nebulization every 6 (six) hours as needed for wheezing or shortness of  breath. 12/12/19   Ok Edwards, PA-C  ALPRAZolam Duanne Moron) 0.5 MG tablet Take one one hour prior to having the nerve tests on the left arm. 09/03/21   Jessy Oto, MD  ALPRAZolam Duanne Moron) 1 MG tablet Take 1 mg by mouth daily as needed for anxiety. 10/23/19   [provider]  amLODipine-olmesartan (AZOR) 5-40 MG tablet Take 1 tablet by mouth daily. 06/05/21   [provider]  celecoxib (CELEBREX) 200 MG capsule Take 1 capsule (200 mg total) by mouth 2 (two) times daily. 09/03/21   Jessy Oto, MD  cetirizine (ZYRTEC) 10 MG tablet Take 10 mg by mouth daily as needed for allergies. 09/27/20   [provider]  cyclobenzaprine (FLEXERIL) 10 MG tablet Take 1 tablet  (10 mg total) by mouth 2 (two) times daily as needed for muscle spasms. 11/27/20   Jessy Oto, MD  dicyclomine (BENTYL) 20 MG tablet Take 1 tablet (20 mg total) by mouth 2 (two) times daily. 04/20/21   Henderly, Britni A, PA-C  escitalopram (LEXAPRO) 20 MG tablet Take 20 mg by mouth daily as needed (depression). 08/10/20   [provider]  fluticasone (FLONASE) 50 MCG/ACT nasal spray Place 1 spray into both nostrils daily as needed for allergies.    [provider]  gabapentin (NEURONTIN) 100 MG capsule Take 1 capsule (100 mg total) by mouth at bedtime. 09/03/21   Jessy Oto, MD  insulin glargine (LANTUS) 100 unit/mL SOPN Inject 50 Units into the skin daily at 2 PM.    [provider]  JANUVIA 100 MG tablet Take 100 mg by mouth daily. 08/23/20   [provider]  metFORMIN (GLUCOPHAGE) 1000 MG tablet Take 1,000 mg by mouth 2 (two) times daily. 03/26/21   [provider]  metoCLOPramide (REGLAN) 10 MG tablet Take 1 tablet (10 mg total) by mouth every 6 (six) hours. Patient not taking: Reported on 06/22/2021 04/20/21   Henderly, Britni A, PA-C  metoprolol succinate (TOPROL-XL) 50 MG 24 hr tablet Take 50 mg by mouth daily. 07/21/14   [provider]  Multiple Vitamins-Minerals (CENTRUM PO) Take 1 tablet by mouth daily.    [provider]  ondansetron (ZOFRAN ODT) 4 MG disintegrating tablet Take 1 tablet (4 mg total) by mouth every 8 (eight) hours as needed for nausea or vomiting. 04/20/21   Henderly, Britni A, PA-C  pantoprazole (PROTONIX) 20 MG tablet Take 1 tablet (20 mg total) by mouth daily. 06/22/21 07/22/21  Loni Beckwith, PA-C  pregabalin (LYRICA) 75 MG capsule Take 1 capsule (75 mg total) by mouth 2 (two) times daily. Patient taking differently: Take 75 mg by mouth 2 (two) times daily as needed (neuropathy). 02/20/21   Sater, Nanine Means, MD  simvastatin (ZOCOR) 20 MG tablet Take 20 mg by mouth daily. 08/22/20   [provider]  sucralfate (CARAFATE) 1 g tablet Take 1 tablet (1 g total) by mouth 4 (four) times daily -  with meals and at bedtime. 06/22/21 07/22/21  Loni Beckwith, PA-C  traMADol (ULTRAM) 50 MG tablet Take 0.5-1 tablets (25-50 mg total) by mouth 2 (two) times daily as needed. Patient taking differently: Take 25-50 mg by mouth 2 (two) times daily as needed for moderate pain. 11/27/20   Jessy Oto, MD  promethazine (PHENERGAN) 25 MG tablet Take 1 tablet (25 mg total) by mouth every 6 (six) hours as needed for nausea or vomiting. Patient not taking: No sig reported 08/15/20 10/19/20  Wieters,  Elesa Hacker, PA-C    Family History Family History  Problem Relation Age of Onset   Hypertension Father    Renal Disease Father    Diabetes Mother    Hypertension Mother    Edema Mother    Diabetes Sister    Diabetes Brother     Social History Social History   Tobacco Use   Smoking status: Every Day    Packs/day: 0.50    Types: Cigarettes   Smokeless tobacco: Never  Vaping Use   Vaping Use: Never used  Substance Use Topics   Alcohol use: No   Drug use: Not Currently    Types: Marijuana     Allergies   Patient has no known allergies.   Review of Systems Review of Systems Per HPI  Physical Exam Triage Vital Signs ED Triage Vitals [09/09/21 0910]  Enc Vitals Group     BP (!) 142/85     Pulse Rate 94     Resp 18     Temp 98.1 F (36.7 C)     Temp Source Oral     SpO2 97 %     Weight      Height      Head Circumference      Peak Flow      Pain Score 0     Pain Loc      Pain Edu?      Excl. in [place]?    No data found.  Updated Vital Signs BP (!) 142/85 (BP Location: Left Arm)    Pulse 94    Temp 98.1 F (36.7 C) (Oral)    Resp 18    SpO2 97%   Visual Acuity Right Eye Distance:   Left Eye Distance:   Bilateral Distance:    Right Eye Near:   Left Eye Near:    Bilateral Near:     Physical Exam Constitutional:      General: She is not in acute distress.     Appearance: Normal appearance. She is not toxic-appearing or diaphoretic.  HENT:     Head: Normocephalic and atraumatic.  Eyes:     Extraocular Movements: Extraocular movements intact.     Conjunctiva/sclera: Conjunctivae normal.  Cardiovascular:     Rate and Rhythm: Normal rate and regular rhythm.     Pulses: Normal pulses.     Heart sounds: Normal heart sounds.  Pulmonary:     Effort: Pulmonary effort is normal. No respiratory distress.     Breath sounds: Normal breath sounds.  Abdominal:     General: Bowel sounds are normal. There is no distension.     Palpations: Abdomen is soft.     Tenderness: There is no abdominal tenderness.  Neurological:     General: No focal deficit present.     Mental Status: She is alert and oriented to person, place, and time. Mental status is at baseline.  Psychiatric:        Mood and Affect: Mood normal.        Behavior: Behavior normal.        Thought Content: Thought content normal.        Judgment: Judgment normal.     UC Treatments / Results  Labs (all labs ordered are listed, but only abnormal results are displayed) Labs Reviewed  NOVEL CORONAVIRUS, NAA    EKG   Radiology No results found.  Procedures Procedures (including critical care time)  Medications Ordered in UC Medications  ondansetron (ZOFRAN) injection 4 mg (  4 mg Intramuscular Given 09/09/21 0931)    Initial Impression / Assessment and Plan / UC Course  I have reviewed the triage vital signs and the nursing notes.  Pertinent labs & imaging results that were available during my care of the patient were reviewed by me and considered in my medical decision making (see chart for details).     It appears that patient may have a viral illness.  Differential diagnoses include viral gastroenteritis or COVID-19 given patient's close exposure.  She was given IM Zofran in urgent care.  She appeared stable for discharge but then reported that she felt she was going to pass  out, so EMS was called to transport her to the hospital for further evaluation and management.  Patient was agreeable with plan and verbalized understanding. Final Clinical Impressions(s) / UC Diagnoses   Final diagnoses:  Viral gastroenteritis  Nausea vomiting and diarrhea  Encounter for laboratory testing for COVID-19 virus     Discharge Instructions      Patient sent to hospital via EMS.     ED Prescriptions   None    PDMP not reviewed this encounter.   Teodora Medici, [place] 09/09/21 1049

## 2021-09-09 NOTE — ED Notes (Signed)
Pt given ginger ale and saltine crackers.  

## 2021-09-09 NOTE — Assessment & Plan Note (Signed)
-  Reports that she has not used in the last 6 weeks but has not had improvement in symptoms without -Ongoing efforts at cessation were encouraged

## 2021-09-09 NOTE — ED Notes (Signed)
On discharge pt states felt like she is going to pass out. Ems contacted

## 2021-09-09 NOTE — Consult Note (Signed)
ER Consult   [name] KXF:[id number] DOB: 1965-09-25 DOA: 09/09/2021 DOS: the patient was seen and examined on 09/09/2021 PCP: Nolene Ebbs, MD  Patient coming from: Home - lives alone; NOK: Daughter, [phone]   Chief Complaint: Abdominal pain  HPI: [name] is a 56 y.o. female with medical history significant of RA; depression; chronic back pain; DM; and HTN presenting with abdominal pain. She was seen in urgent care today and felt pre-syncopal and so was transported to the ER.   She reports that since Friday she has had n/v/d and sweating.  She is having pain and distention in her abdomen, difficulty eating.  Her grandson was diagnosed yesterday with strep and COVID and she has been around him.  She does feel very congested with some SOB and chest discomfort.   Her chest feels tight since yesterday.  She had back surgery, neck fusion, rotator cuff repair and her left hand has been numb as a result with radicular symptoms along her L arm; she is planned for a nerve conduction test.  She thinks her neck and rotator cuff symptoms are related to this.  Last emesis and diarrhea was about 0900.  Still with nausea.  She was able to eat a cracker and tolerate a couple of sips of ginger ale in the ER.      ER Course:  Persistent n/v and abdominal pain.  Acute vs. Chronic.  Dizzy spell at [facility] today.  +cough, covid exposure - COVID negative.  Sees GI at [facility], IBS.  Given IVF, Zofran.  Negative CT.  Wants admission.     Review of Systems: As mentioned in the history of present illness. All other systems reviewed and are negative. Past Medical History:  Diagnosis Date   Anxiety    Arthritis    rheumatoid , osteoarthritis   Bronchitis    Chronic back pain    Depression    Diabetes mellitus    Fatty liver    Fibromyalgia    GERD (gastroesophageal reflux disease)    Headache    Hypertension    Pancreatitis    Pneumonia    Rotator cuff tear, right    Past  Surgical History:  Procedure Laterality Date   ABDOMINAL HYSTERECTOMY     ANTERIOR CERVICAL DECOMP/DISCECTOMY FUSION N/A 07/21/2017   Procedure: ANTERIOR CERVICAL DISCECTOMY AND FUSION C6-7, REMOVAL OF SCREW C6 PLATE, DEPUY ZERO-P IMPLANT, LOCAL BONE GRAFT, ALLOGRAFT BONE GRAFT, VIVIGEN;  Surgeon: Jessy Oto, MD;  Location: [place];  Service: Orthopedics;  Laterality: N/A;   BACK SURGERY     laminectomy   BONE GRAFT HIP ILIAC CREST     BREAST BIOPSY     CARPAL TUNNEL RELEASE     CERVICAL DISCECTOMY  07/21/2017   CESAREAN SECTION     CHOLECYSTECTOMY     COLONOSCOPY     frontal fusion     neck fusion     patient reported   SHOULDER ARTHROSCOPY WITH SUBACROMIAL DECOMPRESSION, ROTATOR CUFF REPAIR AND BICEP TENDON REPAIR Right 10/31/2017   Procedure: RIGHT SHOULDER ARTHROSCOPY, MANIPULATION UNDER ANESTHESIA, BICEPS TENODESIS, AND MINI OPEN ROTATOR CUFF TEAR REPAIR;  Surgeon: Meredith Pel, MD;  Location: [place];  Service: Orthopedics;  Laterality: Right;   Social History:  reports that she has been smoking cigarettes. She has a 40.00 pack-year smoking history. She has never used smokeless tobacco. She reports current drug use. Drug: Marijuana. She reports that she does not drink alcohol.  No Known Allergies  Family History  Problem Relation Age of Onset   Hypertension Father    Renal Disease Father    Diabetes Mother    Hypertension Mother    Edema Mother    Diabetes Sister    Diabetes Brother     Prior to Admission medications   Medication Sig Start Date End Date Taking? Authorizing Provider  acetaminophen (TYLENOL) 500 MG tablet Take 500 mg by mouth 2 (two) times daily as needed for moderate pain.    [provider]  albuterol (PROVENTIL HFA;VENTOLIN HFA) 108 (90 BASE) MCG/ACT inhaler Inhale 2 puffs into the lungs every 4 (four) hours as needed for wheezing.    [provider]  albuterol (PROVENTIL) (2.5 MG/3ML) 0.083% nebulizer solution Take 3 mLs (2.5 mg  total) by nebulization every 6 (six) hours as needed for wheezing or shortness of breath. 12/12/19   Ok Edwards, PA-C  ALPRAZolam Duanne Moron) 0.5 MG tablet Take one one hour prior to having the nerve tests on the left arm. 09/03/21   Jessy Oto, MD  ALPRAZolam Duanne Moron) 1 MG tablet Take 1 mg by mouth daily as needed for anxiety. 10/23/19   [provider]  amLODipine-olmesartan (AZOR) 5-40 MG tablet Take 1 tablet by mouth daily. 06/05/21   [provider]  celecoxib (CELEBREX) 200 MG capsule Take 1 capsule (200 mg total) by mouth 2 (two) times daily. 09/03/21   Jessy Oto, MD  cetirizine (ZYRTEC) 10 MG tablet Take 10 mg by mouth daily as needed for allergies. 09/27/20   [provider]  cyclobenzaprine (FLEXERIL) 10 MG tablet Take 1 tablet (10 mg total) by mouth 2 (two) times daily as needed for muscle spasms. 11/27/20   Jessy Oto, MD  dicyclomine (BENTYL) 20 MG tablet Take 1 tablet (20 mg total) by mouth 2 (two) times daily. 04/20/21   Henderly, Britni A, PA-C  escitalopram (LEXAPRO) 20 MG tablet Take 20 mg by mouth daily as needed (depression). 08/10/20   [provider]  fluticasone (FLONASE) 50 MCG/ACT nasal spray Place 1 spray into both nostrils daily as needed for allergies.    [provider]  gabapentin (NEURONTIN) 100 MG capsule Take 1 capsule (100 mg total) by mouth at bedtime. 09/03/21   Jessy Oto, MD  insulin glargine (LANTUS) 100 unit/mL SOPN Inject 50 Units into the skin daily at 2 PM.    [provider]  JANUVIA 100 MG tablet Take 100 mg by mouth daily. 08/23/20   [provider]  metFORMIN (GLUCOPHAGE) 1000 MG tablet Take 1,000 mg by mouth 2 (two) times daily. 03/26/21   [provider]  metoCLOPramide (REGLAN) 10 MG tablet Take 1 tablet (10 mg total) by mouth every 6 (six) hours. Patient not taking: Reported on 06/22/2021 04/20/21   Henderly, Britni A, PA-C  metoprolol succinate (TOPROL-XL) 50 MG 24 hr tablet Take  50 mg by mouth daily. 07/21/14   [provider]  Multiple Vitamins-Minerals (CENTRUM PO) Take 1 tablet by mouth daily.    [provider]  ondansetron (ZOFRAN ODT) 4 MG disintegrating tablet Take 1 tablet (4 mg total) by mouth every 8 (eight) hours as needed for nausea or vomiting. 04/20/21   Henderly, Britni A, PA-C  pantoprazole (PROTONIX) 20 MG tablet Take 1 tablet (20 mg total) by mouth daily. 06/22/21 07/22/21  Loni Beckwith, PA-C  pregabalin (LYRICA) 75 MG capsule Take 1 capsule (75 mg total) by mouth 2 (two) times daily. Patient taking differently: Take 75  mg by mouth 2 (two) times daily as needed (neuropathy). 02/20/21   Sater, Nanine Means, MD  simvastatin (ZOCOR) 20 MG tablet Take 20 mg by mouth daily. 08/22/20   [provider]  sucralfate (CARAFATE) 1 g tablet Take 1 tablet (1 g total) by mouth 4 (four) times daily -  with meals and at bedtime. 06/22/21 07/22/21  Loni Beckwith, PA-C  traMADol (ULTRAM) 50 MG tablet Take 0.5-1 tablets (25-50 mg total) by mouth 2 (two) times daily as needed. Patient taking differently: Take 25-50 mg by mouth 2 (two) times daily as needed for moderate pain. 11/27/20   Jessy Oto, MD  promethazine (PHENERGAN) 25 MG tablet Take 1 tablet (25 mg total) by mouth every 6 (six) hours as needed for nausea or vomiting. Patient not taking: No sig reported 08/15/20 10/19/20  Janith Lima, PA-C    Physical Exam: Vitals:   09/09/21 1515 09/09/21 1530 09/09/21 1615 09/09/21 1743  BP: 135/87 (!) 115/58 122/62 118/68  Pulse:  85 88 81  Resp: 16 15  14   Temp:    98 F (36.7 C)  SpO2:  96% 99% 99%   General:  Appears calm and comfortable and is in NAD Eyes:  PERRL, EOMI, normal lids, iris ENT:  grossly normal hearing, lips & tongue, mmm Neck:  no LAD, masses or thyromegaly Cardiovascular:  RRR, no m/r/g. No LE edema.  Respiratory:   CTA bilaterally with no wheezes/rales/rhonchi.  Normal respiratory effort. Abdomen:  soft,  mildly diffusely tender, ND Skin:  no rash or induration seen on limited exam Musculoskeletal:  grossly normal tone BUE/BLE, good ROM, no bony abnormality Psychiatric:  grossly normal mood and affect, speech fluent and appropriate, AOx3 Neurologic:  CN 2-12 grossly intact, moves all extremities in coordinated fashion   Radiological Exams on Admission: Independently reviewed - see discussion in A/P where applicable  CT Abdomen Pelvis W Contrast  Result Date: 09/09/2021 CLINICAL DATA:  Abdominal pain EXAM: CT ABDOMEN AND PELVIS WITH CONTRAST TECHNIQUE: Multidetector CT imaging of the abdomen and pelvis was performed using the standard protocol following bolus administration of intravenous contrast. RADIATION DOSE REDUCTION: This exam was performed according to the departmental dose-optimization program which includes automated exposure control, adjustment of the mA and/or kV according to patient size and/or use of iterative reconstruction technique. CONTRAST:  133mL OMNIPAQUE IOHEXOL 300 MG/ML  SOLN COMPARISON:  June 22, 2021 FINDINGS: Lower chest: No acute abnormality. Hepatobiliary: No suspicious hepatic lesion. Hepatic steatosis. Gallbladder surgically absent. No biliary ductal dilation. Pancreas: No pancreatic ductal dilation or evidence of acute inflammation. Spleen: Within normal limits. Adrenals/Urinary Tract: Stable small left adrenal nodules consistent with adenomas. Right adrenal glands unremarkable. No hydronephrosis. Kidneys demonstrate symmetric enhancement and excretion of contrast material. No solid enhancing renal mass. Urinary bladder is unremarkable for degree of distension. Stomach/Bowel: No enteric contrast was administered. Stomach is unremarkable for degree of distension. No pathologic dilation of small or large bowel. The appendix and terminal ileum appear normal. No evidence of acute bowel inflammation. Vascular/Lymphatic: Scattered aortic atherosclerosis without abdominal  aortic aneurysm. No pathologically enlarged abdominal or pelvic lymph nodes. Reproductive: Status post hysterectomy. No suspicious adnexal masses. Other: Diastasis rectus. Small fat containing ventral hernia. No significant abdominopelvic free fluid. Musculoskeletal: L4-L5 and L5-S1 interbody disc spacers. Mild thoracolumbar spondylosis. Mild degenerative change of the hips. No acute osseous abnormality. IMPRESSION: 1. No acute abnormality in the abdomen or pelvis. 2. Hepatic steatosis. 3. Stable small left adrenal adenomas. 4. Small fat containing  ventral hernia. 5. Aortic Atherosclerosis (ICD10-I70.0). Electronically Signed   By: Dahlia Bailiff M.D.   On: 09/09/2021 13:40   DG Chest Port 1 View  Result Date: 09/09/2021 CLINICAL DATA:  Cough. EXAM: PORTABLE CHEST 1 VIEW COMPARISON:  Dec 12, 2019. FINDINGS: Stable cardiomediastinal silhouette. Both lungs are clear. The visualized skeletal structures are unremarkable. IMPRESSION: No active disease. Electronically Signed   By: Marijo Conception M.D.   On: 09/09/2021 12:00    EKG: Independently reviewed.  NSR with rate 90; NSCSLT   Labs on Admission: I have personally reviewed the available labs and imaging studies at the time of the admission.  Pertinent labs:    Glucose 244 Lipase 165 WBC 11.7 COVID/flu negative UA: >500 glucose, 20 ketones   Assessment/Plan * Gastroenteritis- (present on admission) -Patient with chronic GI issues (IBS vs. Cannabinoid hyperemesis vs. Other) presenting with n/v/d -Labs reassuring -Negative CT -Patient was able to tolerate PO in the ER and has not had further n/v/d since this AM -Based on clinical stability, she appears appropriate for dc to home at this time -She did c/o chest pain and troponin was checked and negative, no current concern for ACS -No change to home meds otherwise  Tobacco dependence- (present on admission) -Encourage cessation.   -This was discussed with the patient and should be reviewed  on an ongoing basis.   ->3 minutes were spent on tobacco cessation counseling  Marijuana abuse- (present on admission) -Reports that she has not used in the last 6 weeks but has not had improvement in symptoms without -Ongoing efforts at cessation were encouraged    Thank you for this interesting consult.  Based on overall clinical stability, the patient appears to be appropriate for d/c to home at this time.  Author: Karmen Bongo, MD 09/09/2021 6:03 PM  For on call review [email or web address].

## 2021-09-09 NOTE — ED Notes (Signed)
Yates, MD at bedside 

## 2021-09-09 NOTE — ED Triage Notes (Signed)
Pt BIB EMS from Urgent Care for c/o ABD pain and NVD x 3 days. Pt has a long history of GI issues and has been seen by a GI doctor who hasn't dx with anything specific. Pt reported some dizziness while a urgent care. Urgent care administered 4mg  Zofran IM; EMS administered 314mL NS IV in route. CBG 298; hx of DM. A/Ox4.

## 2021-09-09 NOTE — ED Provider Notes (Signed)
[facility] DEPART[facility] Provider Note   CSN: [id number] Arrival date & time: 09/09/21  1114     History  No chief complaint on file.   [name] is a 56 y.o. female.  She is complaining of 4 days of nausea vomiting diarrhea.  Multiple episodes of yellow vomitus.  Nonbloody diarrhea.  She went to urgent care today because of same and felt dizzy lightheaded there and they called the ambulance to send her here.  She has had a cough and had a close exposure to a COVID with her grandson.  She does have chronic abdominal pain nausea vomiting diarrhea its been going on for years.  She attributes this to multiple abdominal surgeries.  No fever.  Feels generally weak.  No abdominal pain at this time.  The history is provided by the patient.  Emesis Severity:  Moderate Duration:  4 days Timing:  Intermittent Quality:  Bilious material Progression:  Unchanged Chronicity:  Recurrent Recent urination:  Normal Relieved by:  None tried Worsened by:  Nothing Ineffective treatments:  None tried Associated symptoms: cough and diarrhea   Associated symptoms: no abdominal pain, no fever, no headaches and no sore throat   Risk factors: prior abdominal surgery and sick contacts       Home Medications Prior to Admission medications   Medication Sig Start Date End Date Taking? Authorizing Provider  acetaminophen (TYLENOL) 500 MG tablet Take 500 mg by mouth 2 (two) times daily as needed for moderate pain.    [provider]  albuterol (PROVENTIL HFA;VENTOLIN HFA) 108 (90 BASE) MCG/ACT inhaler Inhale 2 puffs into the lungs every 4 (four) hours as needed for wheezing.    [provider]  albuterol (PROVENTIL) (2.5 MG/3ML) 0.083% nebulizer solution Take 3 mLs (2.5 mg total) by nebulization every 6 (six) hours as needed for wheezing or shortness of breath. 12/12/19   Ok Edwards, PA-C  ALPRAZolam Duanne Moron) 0.5 MG tablet Take one one hour prior to having the  nerve tests on the left arm. 09/03/21   Jessy Oto, MD  ALPRAZolam Duanne Moron) 1 MG tablet Take 1 mg by mouth daily as needed for anxiety. 10/23/19   [provider]  amLODipine-olmesartan (AZOR) 5-40 MG tablet Take 1 tablet by mouth daily. 06/05/21   [provider]  celecoxib (CELEBREX) 200 MG capsule Take 1 capsule (200 mg total) by mouth 2 (two) times daily. 09/03/21   Jessy Oto, MD  cetirizine (ZYRTEC) 10 MG tablet Take 10 mg by mouth daily as needed for allergies. 09/27/20   [provider]  cyclobenzaprine (FLEXERIL) 10 MG tablet Take 1 tablet (10 mg total) by mouth 2 (two) times daily as needed for muscle spasms. 11/27/20   Jessy Oto, MD  dicyclomine (BENTYL) 20 MG tablet Take 1 tablet (20 mg total) by mouth 2 (two) times daily. 04/20/21   Henderly, Britni A, PA-C  escitalopram (LEXAPRO) 20 MG tablet Take 20 mg by mouth daily as needed (depression). 08/10/20   [provider]  fluticasone (FLONASE) 50 MCG/ACT nasal spray Place 1 spray into both nostrils daily as needed for allergies.    [provider]  gabapentin (NEURONTIN) 100 MG capsule Take 1 capsule (100 mg total) by mouth at bedtime. 09/03/21   Jessy Oto, MD  insulin glargine (LANTUS) 100 unit/mL SOPN Inject 50 Units into the skin daily at 2 PM.    [provider]  JANUVIA 100 MG tablet Take 100 mg  by mouth daily. 08/23/20   [provider]  metFORMIN (GLUCOPHAGE) 1000 MG tablet Take 1,000 mg by mouth 2 (two) times daily. 03/26/21   [provider]  metoCLOPramide (REGLAN) 10 MG tablet Take 1 tablet (10 mg total) by mouth every 6 (six) hours. Patient not taking: Reported on 06/22/2021 04/20/21   Henderly, Britni A, PA-C  metoprolol succinate (TOPROL-XL) 50 MG 24 hr tablet Take 50 mg by mouth daily. 07/21/14   [provider]  Multiple Vitamins-Minerals (CENTRUM PO) Take 1 tablet by mouth daily.    [provider]  ondansetron (ZOFRAN ODT) 4 MG  disintegrating tablet Take 1 tablet (4 mg total) by mouth every 8 (eight) hours as needed for nausea or vomiting. 04/20/21   Henderly, Britni A, PA-C  pantoprazole (PROTONIX) 20 MG tablet Take 1 tablet (20 mg total) by mouth daily. 06/22/21 07/22/21  Loni Beckwith, PA-C  pregabalin (LYRICA) 75 MG capsule Take 1 capsule (75 mg total) by mouth 2 (two) times daily. Patient taking differently: Take 75 mg by mouth 2 (two) times daily as needed (neuropathy). 02/20/21   Sater, Nanine Means, MD  simvastatin (ZOCOR) 20 MG tablet Take 20 mg by mouth daily. 08/22/20   [provider]  sucralfate (CARAFATE) 1 g tablet Take 1 tablet (1 g total) by mouth 4 (four) times daily -  with meals and at bedtime. 06/22/21 07/22/21  Loni Beckwith, PA-C  traMADol (ULTRAM) 50 MG tablet Take 0.5-1 tablets (25-50 mg total) by mouth 2 (two) times daily as needed. Patient taking differently: Take 25-50 mg by mouth 2 (two) times daily as needed for moderate pain. 11/27/20   Jessy Oto, MD  promethazine (PHENERGAN) 25 MG tablet Take 1 tablet (25 mg total) by mouth every 6 (six) hours as needed for nausea or vomiting. Patient not taking: No sig reported 08/15/20 10/19/20  Wieters, Madelynn Done C, PA-C      Allergies    Patient has no known allergies.    Review of Systems   Review of Systems  Constitutional:  Positive for fatigue. Negative for fever.  HENT:  Negative for sore throat.   Eyes:  Negative for visual disturbance.  Respiratory:  Positive for cough. Negative for shortness of breath.   Cardiovascular:  Negative for chest pain.  Gastrointestinal:  Positive for diarrhea, nausea and vomiting. Negative for abdominal pain.  Genitourinary:  Negative for dysuria.  Musculoskeletal:  Negative for neck pain.  Skin:  Negative for rash.  Neurological:  Negative for headaches.   Physical Exam Updated Vital Signs BP 118/68    Pulse 81    Temp 98 F (36.7 C)    Resp 14    SpO2 99%  Physical Exam Vitals and  nursing note reviewed.  Constitutional:      General: She is not in acute distress.    Appearance: Normal appearance. She is well-developed.  HENT:     Head: Normocephalic and atraumatic.  Eyes:     Conjunctiva/sclera: Conjunctivae normal.  Cardiovascular:     Rate and Rhythm: Normal rate and regular rhythm.     Heart sounds: No murmur heard. Pulmonary:     Effort: Pulmonary effort is normal. No respiratory distress.     Breath sounds: Normal breath sounds. No stridor. No wheezing.  Abdominal:     Palpations: Abdomen is soft.     Tenderness: There is no abdominal tenderness. There is no guarding or rebound.  Musculoskeletal:        General:  No tenderness or deformity. Normal range of motion.     Cervical back: Neck supple.  Skin:    General: Skin is warm and dry.  Neurological:     General: No focal deficit present.     Mental Status: She is alert.     GCS: GCS eye subscore is 4. GCS verbal subscore is 5. GCS motor subscore is 6.    ED Results / Procedures / Treatments   Labs (all labs ordered are listed, but only abnormal results are displayed) Labs Reviewed  COMPREHENSIVE METABOLIC PANEL - Abnormal; Notable for the following components:      Result Value   Glucose, Bld 244 (*)    All other components within normal limits  LIPASE, BLOOD - Abnormal; Notable for the following components:   Lipase 165 (*)    All other components within normal limits  CBC WITH DIFFERENTIAL/PLATELET - Abnormal; Notable for the following components:   WBC 11.7 (*)    Neutro Abs 9.5 (*)    All other components within normal limits  URINALYSIS, ROUTINE W REFLEX MICROSCOPIC - Abnormal; Notable for the following components:   Glucose, UA >=500 (*)    Ketones, ur 20 (*)    All other components within normal limits  RESP PANEL BY RT-PCR (FLU A&B, COVID) ARPGX2  MAGNESIUM  TROPONIN I (HIGH SENSITIVITY)    EKG EKG Interpretation  Date/Time:  Sunday September 09 2021 11:30:15 EST Ventricular  Rate:  90 PR Interval:  181 QRS Duration: 96 QT Interval:  405 QTC Calculation: 496 R Axis:   3 Text Interpretation: Sinus rhythm Low voltage, precordial leads Probable anteroseptal infarct, old No significant change since prior 12/22 Confirmed by Aletta Edouard [phone]) on 09/09/2021 11:38:16 AM  Radiology CT Abdomen Pelvis W Contrast  Result Date: 09/09/2021 CLINICAL DATA:  Abdominal pain EXAM: CT ABDOMEN AND PELVIS WITH CONTRAST TECHNIQUE: Multidetector CT imaging of the abdomen and pelvis was performed using the standard protocol following bolus administration of intravenous contrast. RADIATION DOSE REDUCTION: This exam was performed according to the departmental dose-optimization program which includes automated exposure control, adjustment of the mA and/or kV according to patient size and/or use of iterative reconstruction technique. CONTRAST:  149mL OMNIPAQUE IOHEXOL 300 MG/ML  SOLN COMPARISON:  June 22, 2021 FINDINGS: Lower chest: No acute abnormality. Hepatobiliary: No suspicious hepatic lesion. Hepatic steatosis. Gallbladder surgically absent. No biliary ductal dilation. Pancreas: No pancreatic ductal dilation or evidence of acute inflammation. Spleen: Within normal limits. Adrenals/Urinary Tract: Stable small left adrenal nodules consistent with adenomas. Right adrenal glands unremarkable. No hydronephrosis. Kidneys demonstrate symmetric enhancement and excretion of contrast material. No solid enhancing renal mass. Urinary bladder is unremarkable for degree of distension. Stomach/Bowel: No enteric contrast was administered. Stomach is unremarkable for degree of distension. No pathologic dilation of small or large bowel. The appendix and terminal ileum appear normal. No evidence of acute bowel inflammation. Vascular/Lymphatic: Scattered aortic atherosclerosis without abdominal aortic aneurysm. No pathologically enlarged abdominal or pelvic lymph nodes. Reproductive: Status post hysterectomy.  No suspicious adnexal masses. Other: Diastasis rectus. Small fat containing ventral hernia. No significant abdominopelvic free fluid. Musculoskeletal: L4-L5 and L5-S1 interbody disc spacers. Mild thoracolumbar spondylosis. Mild degenerative change of the hips. No acute osseous abnormality. IMPRESSION: 1. No acute abnormality in the abdomen or pelvis. 2. Hepatic steatosis. 3. Stable small left adrenal adenomas. 4. Small fat containing ventral hernia. 5. Aortic Atherosclerosis (ICD10-I70.0). Electronically Signed   By: Dahlia Bailiff M.D.   On: 09/09/2021 13:40   DG  Chest Port 1 View  Result Date: 09/09/2021 CLINICAL DATA:  Cough. EXAM: PORTABLE CHEST 1 VIEW COMPARISON:  Dec 12, 2019. FINDINGS: Stable cardiomediastinal silhouette. Both lungs are clear. The visualized skeletal structures are unremarkable. IMPRESSION: No active disease. Electronically Signed   By: Marijo Conception M.D.   On: 09/09/2021 12:00    Procedures Procedures    Medications Ordered in ED Medications  sodium chloride 0.9 % bolus 1,000 mL (0 mLs Intravenous Stopped 09/09/21 1345)  iohexol (OMNIPAQUE) 300 MG/ML solution 100 mL (100 mLs Intravenous Contrast Given 09/09/21 1306)  morphine 4 MG/ML injection 4 mg (4 mg Intravenous Given 09/09/21 1326)  ondansetron (ZOFRAN) injection 4 mg (4 mg Intravenous Given 09/09/21 1414)    ED Course/ Medical Decision Making/ A&P Clinical Course as of 09/09/21 2210  Sun Sep 09, 2021  1151 Chest x-ray interpreted by me as no acute infiltrates no pneumothorax.  Awaiting radiology reading. [MB]  1440 She still states she is nauseous and having pain.  She does not feel comfortable with discharge.  She has had a cholecystectomy and denies any alcohol intake. [MB]  1696 Discussed with Dr. Lorin Mercy who will evaluate the patient for possible admission. [MB]  7893 Patient was seen by Dr. Lorin Mercy.  She asked if we can get a troponin.  Otherwise the patient can be discharged and follow-up outpatient.  Patient in  agreement with plan. [MB]    Clinical Course User Index [MB] Hayden Rasmussen, MD                           Medical Decision Making Amount and/or Complexity of Data Reviewed Labs: ordered. Radiology: ordered.  Risk Prescription drug management.  [name] was evaluated in Emergency Department on 09/09/2021 for the symptoms described in the history of present illness. She was evaluated in the context of the global COVID-19 pandemic, which necessitated consideration that the patient might be at risk for infection with the SARS-CoV-2 virus that causes COVID-19. Institutional protocols and algorithms that pertain to the evaluation of patients at risk for COVID-19 are in a state of rapid change based on information released by regulatory bodies including the CDC and federal and state organizations. These policies and algorithms were followed during the patient's care in the ED.  This patient complains of vomiting diarrhea, initially had no abdominal pain abdominal pain worsening while in department; this involves an extensive number of treatment Options and is a complaint that carries with it a high risk of complications and Morbidity. The differential includes diverticulitis, colitis, stone, irritable bowel dehydration, UTI, COVID, flu  I ordered, reviewed and interpreted labs, which included CBC with mildly elevated white count stable hemoglobin, chemistries normal elevated glucose COVID and flu negative, troponin unremarkable, urinalysis without clear signs of infection, lipase elevated I ordered medication IV fluids IV pain and nausea medication with improvement patient's symptoms I ordered imaging studies which included pelvis and chest x-ray and I independently    visualized and interpreted imaging which showed no acute findings Previous records obtained and reviewed in epic, recently seen in December for epigastric pain I consulted Dr. Lorin Mercy [facility] hospitalist and discussed lab  and imaging findings  Critical Interventions: None  After the interventions stated above, I reevaluated the patient and found patient still be symptomatic.  Discussed with [facility] hospitalist Dr. Lorin Mercy.  She evaluated patient with a note saying the patient does not need to be admitted at this time.  Single  troponin requested and unremarkable.  Patient states her symptoms are improved and she feels appropriate for discharge.  Return instructions discussed          Final Clinical Impression(s) / ED Diagnoses Final diagnoses:  Generalized abdominal pain  Nausea vomiting and diarrhea    Rx / DC Orders ED Discharge Orders          Ordered    ondansetron (ZOFRAN ODT) 4 MG disintegrating tablet  Every 8 hours PRN        09/09/21 1734    loperamide (IMODIUM) 2 MG capsule  4 times daily PRN        09/09/21 1734              Hayden Rasmussen, MD 09/09/21 2213

## 2021-09-10 LAB — SARS-COV-2, NAA 2 DAY TAT

## 2021-09-10 LAB — NOVEL CORONAVIRUS, NAA: SARS-CoV-2, NAA: NOT DETECTED

## 2021-09-12 ENCOUNTER — Inpatient Hospital Stay (HOSPITAL_COMMUNITY)
Admission: EM | Admit: 2021-09-12 | Discharge: 2021-09-14 | DRG: 440 | Disposition: A | Discharge status: Home / Self Care | Payer: Medicare Other | Attending: Family Medicine | Admitting: Family Medicine

## 2021-09-12 ENCOUNTER — Encounter (HOSPITAL_COMMUNITY): Payer: Self-pay | Admitting: Emergency Medicine

## 2021-09-12 ENCOUNTER — Other Ambulatory Visit: Payer: Self-pay

## 2021-09-12 ENCOUNTER — Observation Stay (HOSPITAL_COMMUNITY): Payer: Medicare Other

## 2021-09-12 DIAGNOSIS — D3502 Benign neoplasm of left adrenal gland: Secondary | ICD-10-CM

## 2021-09-12 DIAGNOSIS — M069 Rheumatoid arthritis, unspecified: Secondary | ICD-10-CM | POA: Diagnosis present

## 2021-09-12 DIAGNOSIS — Z791 Long term (current) use of non-steroidal anti-inflammatories (NSAID): Secondary | ICD-10-CM

## 2021-09-12 DIAGNOSIS — I10 Essential (primary) hypertension: Secondary | ICD-10-CM | POA: Diagnosis present

## 2021-09-12 DIAGNOSIS — R1084 Generalized abdominal pain: Secondary | ICD-10-CM

## 2021-09-12 DIAGNOSIS — M797 Fibromyalgia: Secondary | ICD-10-CM | POA: Diagnosis present

## 2021-09-12 DIAGNOSIS — E1169 Type 2 diabetes mellitus with other specified complication: Secondary | ICD-10-CM | POA: Diagnosis present

## 2021-09-12 DIAGNOSIS — Z9049 Acquired absence of other specified parts of digestive tract: Secondary | ICD-10-CM

## 2021-09-12 DIAGNOSIS — F17210 Nicotine dependence, cigarettes, uncomplicated: Secondary | ICD-10-CM | POA: Diagnosis present

## 2021-09-12 DIAGNOSIS — Z7984 Long term (current) use of oral hypoglycemic drugs: Secondary | ICD-10-CM

## 2021-09-12 DIAGNOSIS — Z90710 Acquired absence of both cervix and uterus: Secondary | ICD-10-CM

## 2021-09-12 DIAGNOSIS — E119 Type 2 diabetes mellitus without complications: Secondary | ICD-10-CM | POA: Diagnosis present

## 2021-09-12 DIAGNOSIS — Z833 Family history of diabetes mellitus: Secondary | ICD-10-CM

## 2021-09-12 DIAGNOSIS — E6609 Other obesity due to excess calories: Secondary | ICD-10-CM

## 2021-09-12 DIAGNOSIS — G8929 Other chronic pain: Secondary | ICD-10-CM | POA: Diagnosis present

## 2021-09-12 DIAGNOSIS — F32A Depression, unspecified: Secondary | ICD-10-CM | POA: Diagnosis present

## 2021-09-12 DIAGNOSIS — Z79899 Other long term (current) drug therapy: Secondary | ICD-10-CM

## 2021-09-12 DIAGNOSIS — E669 Obesity, unspecified: Secondary | ICD-10-CM | POA: Diagnosis present

## 2021-09-12 DIAGNOSIS — E876 Hypokalemia: Secondary | ICD-10-CM

## 2021-09-12 DIAGNOSIS — R112 Nausea with vomiting, unspecified: Secondary | ICD-10-CM | POA: Diagnosis present

## 2021-09-12 DIAGNOSIS — Z8249 Family history of ischemic heart disease and other diseases of the circulatory system: Secondary | ICD-10-CM

## 2021-09-12 DIAGNOSIS — R197 Diarrhea, unspecified: Secondary | ICD-10-CM

## 2021-09-12 DIAGNOSIS — K219 Gastro-esophageal reflux disease without esophagitis: Secondary | ICD-10-CM | POA: Diagnosis present

## 2021-09-12 DIAGNOSIS — K760 Fatty (change of) liver, not elsewhere classified: Secondary | ICD-10-CM | POA: Diagnosis present

## 2021-09-12 DIAGNOSIS — Z20822 Contact with and (suspected) exposure to covid-19: Secondary | ICD-10-CM | POA: Diagnosis present

## 2021-09-12 DIAGNOSIS — Z841 Family history of disorders of kidney and ureter: Secondary | ICD-10-CM

## 2021-09-12 DIAGNOSIS — F419 Anxiety disorder, unspecified: Secondary | ICD-10-CM

## 2021-09-12 DIAGNOSIS — M549 Dorsalgia, unspecified: Secondary | ICD-10-CM | POA: Diagnosis present

## 2021-09-12 DIAGNOSIS — Z981 Arthrodesis status: Secondary | ICD-10-CM

## 2021-09-12 DIAGNOSIS — K859 Acute pancreatitis without necrosis or infection, unspecified: Secondary | ICD-10-CM | POA: Diagnosis not present

## 2021-09-12 DIAGNOSIS — Z6836 Body mass index (BMI) 36.0-36.9, adult: Secondary | ICD-10-CM

## 2021-09-12 DIAGNOSIS — M1990 Unspecified osteoarthritis, unspecified site: Secondary | ICD-10-CM | POA: Diagnosis present

## 2021-09-12 DIAGNOSIS — Z794 Long term (current) use of insulin: Secondary | ICD-10-CM

## 2021-09-12 LAB — CBC WITH DIFFERENTIAL/PLATELET
Abs Immature Granulocytes: 0.04 10*3/uL (ref 0.00–0.07)
Basophils Absolute: 0.1 10*3/uL (ref 0.0–0.1)
Basophils Relative: 0 %
Eosinophils Absolute: 0 10*3/uL (ref 0.0–0.5)
Eosinophils Relative: 0 %
HCT: 42.6 % (ref 36.0–46.0)
Hemoglobin: 14.7 g/dL (ref 12.0–15.0)
Immature Granulocytes: 0 %
Lymphocytes Relative: 27 %
Lymphs Abs: 3.3 10*3/uL (ref 0.7–4.0)
MCH: 28.7 pg (ref 26.0–34.0)
MCHC: 34.5 g/dL (ref 30.0–36.0)
MCV: 83 fL (ref 80.0–100.0)
Monocytes Absolute: 0.6 10*3/uL (ref 0.1–1.0)
Monocytes Relative: 5 %
Neutro Abs: 8.2 10*3/uL — ABNORMAL HIGH (ref 1.7–7.7)
Neutrophils Relative %: 68 %
Platelets: 402 10*3/uL — ABNORMAL HIGH (ref 150–400)
RBC: 5.13 MIL/uL — ABNORMAL HIGH (ref 3.87–5.11)
RDW: 13.7 % (ref 11.5–15.5)
WBC: 12.2 10*3/uL — ABNORMAL HIGH (ref 4.0–10.5)
nRBC: 0 % (ref 0.0–0.2)

## 2021-09-12 LAB — URINALYSIS, ROUTINE W REFLEX MICROSCOPIC
Bilirubin Urine: NEGATIVE
Glucose, UA: 50 mg/dL — AB
Hgb urine dipstick: NEGATIVE
Ketones, ur: 80 mg/dL — AB
Leukocytes,Ua: NEGATIVE
Nitrite: NEGATIVE
Protein, ur: NEGATIVE mg/dL
Specific Gravity, Urine: 1.016 (ref 1.005–1.030)
pH: 6 (ref 5.0–8.0)

## 2021-09-12 LAB — RESP PANEL BY RT-PCR (FLU A&B, COVID) ARPGX2
Influenza A by PCR: NEGATIVE
Influenza B by PCR: NEGATIVE
SARS Coronavirus 2 by RT PCR: NEGATIVE

## 2021-09-12 LAB — COMPREHENSIVE METABOLIC PANEL
ALT: 20 U/L (ref 0–44)
AST: 20 U/L (ref 15–41)
Albumin: 4.6 g/dL (ref 3.5–5.0)
Alkaline Phosphatase: 95 U/L (ref 38–126)
Anion gap: 13 (ref 5–15)
BUN: 11 mg/dL (ref 6–20)
CO2: 23 mmol/L (ref 22–32)
Calcium: 9.4 mg/dL (ref 8.9–10.3)
Chloride: 102 mmol/L (ref 98–111)
Creatinine, Ser: 0.62 mg/dL (ref 0.44–1.00)
GFR, Estimated: >60 mL/min (ref >60)
Glucose, Bld: 166 mg/dL — ABNORMAL HIGH (ref 70–99)
Potassium: 3.2 mmol/L — ABNORMAL LOW (ref 3.5–5.1)
Sodium: 138 mmol/L (ref 135–145)
Total Bilirubin: 0.9 mg/dL (ref 0.3–1.2)
Total Protein: 8 g/dL (ref 6.5–8.1)

## 2021-09-12 LAB — CBG MONITORING, ED: Glucose-Capillary: 162 mg/dL — ABNORMAL HIGH (ref 70–99)

## 2021-09-12 LAB — LIPASE, BLOOD: Lipase: 63 U/L — ABNORMAL HIGH (ref 11–51)

## 2021-09-12 LAB — GLUCOSE, CAPILLARY
Glucose-Capillary: 176 mg/dL — ABNORMAL HIGH (ref 70–99)
Glucose-Capillary: 133 mg/dL — ABNORMAL HIGH (ref 70–99)

## 2021-09-12 LAB — MAGNESIUM: Magnesium: 1.9 mg/dL (ref 1.7–2.4)

## 2021-09-12 MED ORDER — SODIUM CHLORIDE 0.9 % IV SOLN
12.5000 mg | Freq: Four times a day (QID) | INTRAVENOUS | Status: DC | PRN
  Administered 2021-09-12 – 2021-09-13 (×4): 12.5 mg via INTRAVENOUS
  Filled 2021-09-12 (×4): qty 12.5

## 2021-09-12 MED ORDER — POTASSIUM CHLORIDE 10 MEQ/100ML IV SOLN
10.0000 meq | INTRAVENOUS | Status: AC
  Administered 2021-09-12 (×3): 10 meq via INTRAVENOUS
  Filled 2021-09-12 (×3): qty 100

## 2021-09-12 MED ORDER — DICYCLOMINE HCL 10 MG PO CAPS
10.0000 mg | ORAL_CAPSULE | Freq: Three times a day (TID) | ORAL | Status: DC
  Administered 2021-09-12 – 2021-09-14 (×6): 10 mg via ORAL
  Filled 2021-09-12 (×6): qty 1

## 2021-09-12 MED ORDER — SODIUM CHLORIDE 0.9 % IV SOLN: INTRAVENOUS | Status: DC

## 2021-09-12 MED ORDER — MORPHINE SULFATE (PF) 4 MG/ML IV SOLN
4.0000 mg | Freq: Once | INTRAVENOUS | Status: AC
  Administered 2021-09-12: 4 mg via INTRAVENOUS
  Filled 2021-09-12: qty 1

## 2021-09-12 MED ORDER — INSULIN ASPART 100 UNIT/ML IJ SOLN
0.0000 [IU] | Freq: Three times a day (TID) | INTRAMUSCULAR | Status: DC
  Administered 2021-09-12: 3 [IU] via SUBCUTANEOUS
  Administered 2021-09-13: 5 [IU] via SUBCUTANEOUS
  Administered 2021-09-14 (×2): 3 [IU] via SUBCUTANEOUS
  Filled 2021-09-12: qty 0.15

## 2021-09-12 MED ORDER — PANTOPRAZOLE SODIUM 40 MG IV SOLR
40.0000 mg | Freq: Every day | INTRAVENOUS | Status: DC
  Administered 2021-09-12 – 2021-09-14 (×3): 40 mg via INTRAVENOUS
  Filled 2021-09-12 (×3): qty 40

## 2021-09-12 MED ORDER — LACTATED RINGERS IV BOLUS
1000.0000 mL | Freq: Once | INTRAVENOUS | Status: AC
  Administered 2021-09-12: 1000 mL via INTRAVENOUS

## 2021-09-12 MED ORDER — MORPHINE SULFATE (PF) 4 MG/ML IV SOLN
4.0000 mg | Freq: Once | INTRAVENOUS | Status: AC
Start: 2021-09-12 — End: 2021-09-12
  Administered 2021-09-12: 4 mg via INTRAVENOUS
  Filled 2021-09-12: qty 1

## 2021-09-12 MED ORDER — MORPHINE SULFATE (PF) 4 MG/ML IV SOLN
4.0000 mg | INTRAVENOUS | Status: DC | PRN
  Administered 2021-09-12 – 2021-09-13 (×2): 4 mg via INTRAVENOUS
  Filled 2021-09-12 (×2): qty 1

## 2021-09-12 MED ORDER — PROCHLORPERAZINE EDISYLATE 10 MG/2ML IJ SOLN
10.0000 mg | Freq: Four times a day (QID) | INTRAMUSCULAR | Status: DC | PRN
  Administered 2021-09-12: 10 mg via INTRAVENOUS
  Filled 2021-09-12: qty 2

## 2021-09-12 MED ORDER — ONDANSETRON HCL 4 MG/2ML IJ SOLN
4.0000 mg | Freq: Once | INTRAMUSCULAR | Status: AC
  Administered 2021-09-12: 4 mg via INTRAVENOUS
  Filled 2021-09-12: qty 2

## 2021-09-12 MED ORDER — MORPHINE SULFATE (PF) 2 MG/ML IV SOLN
1.0000 mg | Freq: Once | INTRAVENOUS | Status: AC
  Administered 2021-09-13: 1 mg via INTRAVENOUS
  Filled 2021-09-12: qty 1

## 2021-09-12 NOTE — ED Triage Notes (Signed)
Per pt, states abdominal pain and vomiting since Friday-states she was seen in ED on 1/29

## 2021-09-12 NOTE — H&P (Signed)
History and Physical    [name] PRF:[id number] DOB: 21-Aug-1965 DOA: 09/12/2021 DOS: the patient was seen and examined on 09/12/2021 PCP: Nolene Ebbs, MD  Patient coming from: Home  Chief Complaint:  Chief Complaint  Patient presents with   Abdominal Pain    HPI: [name] is a 56 y.o. female with medical history significant of anxiety/depression, RA, DM2, HTN, chronic back pain. Presenting with LUQ ab pain, N/V. She was previously seen in ED on 1/29. W/u at that time showed elevated lipase, but no imaging suggestive of pancreatitis. She was able to tolerate PO at the time and N/V had improved. She was sent home. She states that her symptoms continued when she was home. She reports that this morning she woke with "excruciating" LUQ abdominal pain. She had N/V and has not been able to keep anything down since. She became concerned and came to the ED for help. She denies any other aggravating or alleviating factors.    Review of Systems: As mentioned in the history of present illness. All other systems reviewed and are negative. Past Medical History:  Diagnosis Date   Anxiety    Arthritis    rheumatoid , osteoarthritis   Bronchitis    Chronic back pain    Depression    Diabetes mellitus    Fatty liver    Fibromyalgia    GERD (gastroesophageal reflux disease)    Headache    Hypertension    Pancreatitis    Pneumonia    Rotator cuff tear, right    Past Surgical History:  Procedure Laterality Date   ABDOMINAL HYSTERECTOMY     ANTERIOR CERVICAL DECOMP/DISCECTOMY FUSION N/A 07/21/2017   Procedure: ANTERIOR CERVICAL DISCECTOMY AND FUSION C6-7, REMOVAL OF SCREW C6 PLATE, DEPUY ZERO-P IMPLANT, LOCAL BONE GRAFT, ALLOGRAFT BONE GRAFT, VIVIGEN;  Surgeon: Jessy Oto, MD;  Location: [facility];  Service: Orthopedics;  Laterality: N/A;   BACK SURGERY     laminectomy   BONE GRAFT HIP ILIAC CREST     BREAST BIOPSY     CARPAL TUNNEL RELEASE     CERVICAL  DISCECTOMY  07/21/2017   CESAREAN SECTION     CHOLECYSTECTOMY     COLONOSCOPY     frontal fusion     neck fusion     patient reported   SHOULDER ARTHROSCOPY WITH SUBACROMIAL DECOMPRESSION, ROTATOR CUFF REPAIR AND BICEP TENDON REPAIR Right 10/31/2017   Procedure: RIGHT SHOULDER ARTHROSCOPY, MANIPULATION UNDER ANESTHESIA, BICEPS TENODESIS, AND MINI OPEN ROTATOR CUFF TEAR REPAIR;  Surgeon: Meredith Pel, MD;  Location: [facility];  Service: Orthopedics;  Laterality: Right;   Social History:  reports that she has been smoking cigarettes. She has a 40.00 pack-year smoking history. She has never used smokeless tobacco. She reports current drug use. Drug: Marijuana. She reports that she does not drink alcohol.  No Known Allergies  Family History  Problem Relation Age of Onset   Hypertension Father    Renal Disease Father    Diabetes Mother    Hypertension Mother    Edema Mother    Diabetes Sister    Diabetes Brother     Prior to Admission medications   Medication Sig Start Date End Date Taking? Authorizing Provider  acetaminophen (TYLENOL) 500 MG tablet Take 500 mg by mouth 2 (two) times daily as needed for moderate pain.    [provider]  albuterol (PROVENTIL HFA;VENTOLIN HFA) 108 (90 BASE) MCG/ACT inhaler Inhale 2 puffs into the lungs every 4 (  four) hours as needed for wheezing.    [provider]  albuterol (PROVENTIL) (2.5 MG/3ML) 0.083% nebulizer solution Take 3 mLs (2.5 mg total) by nebulization every 6 (six) hours as needed for wheezing or shortness of breath. 12/12/19   Ok Edwards, PA-C  ALPRAZolam Duanne Moron) 0.5 MG tablet Take one one hour prior to having the nerve tests on the left arm. 09/03/21   Jessy Oto, MD  ALPRAZolam Duanne Moron) 1 MG tablet Take 1 mg by mouth daily as needed for anxiety. 10/23/19   [provider]  amLODipine-olmesartan (AZOR) 5-40 MG tablet Take 1 tablet by mouth daily. 06/05/21   [provider]  celecoxib (CELEBREX) 200 MG  capsule Take 1 capsule (200 mg total) by mouth 2 (two) times daily. 09/03/21   Jessy Oto, MD  cetirizine (ZYRTEC) 10 MG tablet Take 10 mg by mouth daily as needed for allergies. 09/27/20   [provider]  cyclobenzaprine (FLEXERIL) 10 MG tablet Take 1 tablet (10 mg total) by mouth 2 (two) times daily as needed for muscle spasms. 11/27/20   Jessy Oto, MD  dicyclomine (BENTYL) 20 MG tablet Take 1 tablet (20 mg total) by mouth 2 (two) times daily. 04/20/21   Henderly, Britni A, PA-C  escitalopram (LEXAPRO) 20 MG tablet Take 20 mg by mouth daily as needed (depression). 08/10/20   [provider]  fluticasone (FLONASE) 50 MCG/ACT nasal spray Place 1 spray into both nostrils daily as needed for allergies.    [provider]  gabapentin (NEURONTIN) 100 MG capsule Take 1 capsule (100 mg total) by mouth at bedtime. 09/03/21   Jessy Oto, MD  insulin glargine (LANTUS) 100 unit/mL SOPN Inject 50 Units into the skin daily at 2 PM.    [provider]  JANUVIA 100 MG tablet Take 100 mg by mouth daily. 08/23/20   [provider]  loperamide (IMODIUM) 2 MG capsule Take 1 capsule (2 mg total) by mouth 4 (four) times daily as needed for diarrhea or loose stools. 09/09/21   Hayden Rasmussen, MD  metFORMIN (GLUCOPHAGE) 1000 MG tablet Take 1,000 mg by mouth 2 (two) times daily. 03/26/21   [provider]  metoCLOPramide (REGLAN) 10 MG tablet Take 1 tablet (10 mg total) by mouth every 6 (six) hours. Patient not taking: Reported on 06/22/2021 04/20/21   Henderly, Britni A, PA-C  metoprolol succinate (TOPROL-XL) 50 MG 24 hr tablet Take 50 mg by mouth daily. 07/21/14   [provider]  Multiple Vitamins-Minerals (CENTRUM PO) Take 1 tablet by mouth daily.    [provider]  ondansetron (ZOFRAN ODT) 4 MG disintegrating tablet Take 1 tablet (4 mg total) by mouth every 8 (eight) hours as needed for nausea or vomiting. 09/09/21   Hayden Rasmussen, MD   pantoprazole (PROTONIX) 20 MG tablet Take 1 tablet (20 mg total) by mouth daily. 06/22/21 07/22/21  Loni Beckwith, PA-C  pregabalin (LYRICA) 75 MG capsule Take 1 capsule (75 mg total) by mouth 2 (two) times daily. Patient taking differently: Take 75 mg by mouth 2 (two) times daily as needed (neuropathy). 02/20/21   Sater, Nanine Means, MD  simvastatin (ZOCOR) 20 MG tablet Take 20 mg by mouth daily. 08/22/20   [provider]  sucralfate (CARAFATE) 1 g tablet Take 1 tablet (1 g total) by mouth 4 (four) times daily -  with meals and at bedtime. 06/22/21 07/22/21  Loni Beckwith, PA-C  traMADol (ULTRAM) 50 MG tablet Take  0.5-1 tablets (25-50 mg total) by mouth 2 (two) times daily as needed. Patient taking differently: Take 25-50 mg by mouth 2 (two) times daily as needed for moderate pain. 11/27/20   Jessy Oto, MD  promethazine (PHENERGAN) 25 MG tablet Take 1 tablet (25 mg total) by mouth every 6 (six) hours as needed for nausea or vomiting. Patient not taking: No sig reported 08/15/20 10/19/20  Janith Lima, PA-C    Physical Exam: Vitals:   09/12/21 0713 09/12/21 0922  BP: (!) 124/91 (!) 170/89  Pulse: (!) 102 88  Resp: 16 16  Temp: 98.5 F (36.9 C)   TempSrc: Oral   SpO2: 100% 96%   General: 56 y.o. female resting in bed in NAD Eyes: PERRL, normal sclera ENMT: Nares patent w/o discharge, orophaynx clear, dentition normal, ears w/o discharge/lesions/ulcers Neck: Supple, trachea midline Cardiovascular: RRR, +S1, S2, no m/g/r, equal pulses throughout Respiratory: CTABL, no w/r/r, normal WOB GI: BS hypoactive, ND, palpation during ascultation illicit minor TPP in LUQ - this is increased with palpation with free hand, no masses noted, no organomegaly noted MSK: No e/c/c Neuro: A&O x 3, no focal deficits Psyc: Appropriate interaction and affect, calm/cooperative   Data Reviewed:  Glucose 166  Lipase 63 (down from 165 on 09/09/21) WBC 12.2 (stable from 11.7 on  09/09/21)  Assessment and Plan: No notes have been filed under this hospital service. Service: Hospitalist  Intractable N/V     - place in obs, med-surg     - fluids, anti-emetics, pain control, bentyl     - ?etiology: imaging was negative 09/09/21 for pancreatitis although lipase was elevated; labs now are improved from that time     - keep her NPO except ice chips, sip for now; check KUB  DM2     - she says her A1c was 10 at her PCP recently, but no record here     - check A1c, start SSI, glucose checks  HTN     - resume home regimen as tolerated; will have PRNs available  Hypokalemia     - check Mg2+; replace K+  Anxiety     - resume home regimen as tolerated  Advance Care Planning: FULL  Consults: None  Family Communication: None at bedside  Severity of Illness: The appropriate patient status for this patient is OBSERVATION. Observation status is judged to be reasonable and necessary in order to provide the required intensity of service to ensure the patient's safety. The patient's presenting symptoms, physical exam findings, and initial radiographic and laboratory data in the context of their medical condition is felt to place them at decreased risk for further clinical deterioration. Furthermore, it is anticipated that the patient will be medically stable for discharge from the hospital within 2 midnights of admission.   Author: Jonnie Finner, DO 09/12/2021 9:52 AM  For on call review [email or web address].

## 2021-09-12 NOTE — ED Notes (Signed)
Admitting Provider at bedside. 

## 2021-09-12 NOTE — ED Provider Notes (Signed)
[facility] DEPT Provider Note   CSN: [id number] Arrival date & time: 09/12/21  0707     History  Chief Complaint  Patient presents with   Abdominal Pain    Linzie Criss is a 56 y.o. female.      56 year old female presents today for evaluation of abdominal pain, nausea, vomiting onset Friday.  Patient was evaluated in the emergency room Sunday.  Patient reports her symptoms have been persistent since then and significantly worsened around 1 AM last night when she woke up with severe abdominal pain.  Patient on exam is crying and restless because of pain.  Patient endorses lack of p.o. intake and states her last intake may have been breakfast time yesterday.  The history is provided by the patient. No language interpreter was used.  Abdominal Pain Associated symptoms: diarrhea, nausea and vomiting   Associated symptoms: no chills, no fever and no shortness of breath       Home Medications Prior to Admission medications   Medication Sig Start Date End Date Taking? Authorizing Provider  acetaminophen (TYLENOL) 500 MG tablet Take 500 mg by mouth 2 (two) times daily as needed for moderate pain.    [provider]  albuterol (PROVENTIL HFA;VENTOLIN HFA) 108 (90 BASE) MCG/ACT inhaler Inhale 2 puffs into the lungs every 4 (four) hours as needed for wheezing.    [provider]  albuterol (PROVENTIL) (2.5 MG/3ML) 0.083% nebulizer solution Take 3 mLs (2.5 mg total) by nebulization every 6 (six) hours as needed for wheezing or shortness of breath. 12/12/19   Ok Edwards, PA-C  ALPRAZolam Duanne Moron) 0.5 MG tablet Take one one hour prior to having the nerve tests on the left arm. 09/03/21   Jessy Oto, MD  ALPRAZolam Duanne Moron) 1 MG tablet Take 1 mg by mouth daily as needed for anxiety. 10/23/19   [provider]  amLODipine-olmesartan (AZOR) 5-40 MG tablet Take 1 tablet by mouth daily. 06/05/21   [provider]  celecoxib  (CELEBREX) 200 MG capsule Take 1 capsule (200 mg total) by mouth 2 (two) times daily. 09/03/21   Jessy Oto, MD  cetirizine (ZYRTEC) 10 MG tablet Take 10 mg by mouth daily as needed for allergies. 09/27/20   [provider]  cyclobenzaprine (FLEXERIL) 10 MG tablet Take 1 tablet (10 mg total) by mouth 2 (two) times daily as needed for muscle spasms. 11/27/20   Jessy Oto, MD  dicyclomine (BENTYL) 20 MG tablet Take 1 tablet (20 mg total) by mouth 2 (two) times daily. 04/20/21   Henderly, Britni A, PA-C  escitalopram (LEXAPRO) 20 MG tablet Take 20 mg by mouth daily as needed (depression). 08/10/20   [provider]  fluticasone (FLONASE) 50 MCG/ACT nasal spray Place 1 spray into both nostrils daily as needed for allergies.    [provider]  gabapentin (NEURONTIN) 100 MG capsule Take 1 capsule (100 mg total) by mouth at bedtime. 09/03/21   Jessy Oto, MD  insulin glargine (LANTUS) 100 unit/mL SOPN Inject 50 Units into the skin daily at 2 PM.    [provider]  JANUVIA 100 MG tablet Take 100 mg by mouth daily. 08/23/20   [provider]  loperamide (IMODIUM) 2 MG capsule Take 1 capsule (2 mg total) by mouth 4 (four) times daily as needed for diarrhea or loose stools. 09/09/21   Hayden Rasmussen, MD  metFORMIN (GLUCOPHAGE) 1000 MG tablet Take 1,000 mg by mouth 2 (two) times  daily. 03/26/21   [provider]  metoCLOPramide (REGLAN) 10 MG tablet Take 1 tablet (10 mg total) by mouth every 6 (six) hours. Patient not taking: Reported on 06/22/2021 04/20/21   Henderly, Britni A, PA-C  metoprolol succinate (TOPROL-XL) 50 MG 24 hr tablet Take 50 mg by mouth daily. 07/21/14   [provider]  Multiple Vitamins-Minerals (CENTRUM PO) Take 1 tablet by mouth daily.    [provider]  ondansetron (ZOFRAN ODT) 4 MG disintegrating tablet Take 1 tablet (4 mg total) by mouth every 8 (eight) hours as needed for nausea or vomiting. 09/09/21   Hayden Rasmussen, MD  pantoprazole (PROTONIX) 20 MG tablet Take 1 tablet (20 mg total) by mouth daily. 06/22/21 07/22/21  Loni Beckwith, PA-C  pregabalin (LYRICA) 75 MG capsule Take 1 capsule (75 mg total) by mouth 2 (two) times daily. Patient taking differently: Take 75 mg by mouth 2 (two) times daily as needed (neuropathy). 02/20/21   Sater, Nanine Means, MD  simvastatin (ZOCOR) 20 MG tablet Take 20 mg by mouth daily. 08/22/20   [provider]  sucralfate (CARAFATE) 1 g tablet Take 1 tablet (1 g total) by mouth 4 (four) times daily -  with meals and at bedtime. 06/22/21 07/22/21  Loni Beckwith, PA-C  traMADol (ULTRAM) 50 MG tablet Take 0.5-1 tablets (25-50 mg total) by mouth 2 (two) times daily as needed. Patient taking differently: Take 25-50 mg by mouth 2 (two) times daily as needed for moderate pain. 11/27/20   Jessy Oto, MD  promethazine (PHENERGAN) 25 MG tablet Take 1 tablet (25 mg total) by mouth every 6 (six) hours as needed for nausea or vomiting. Patient not taking: No sig reported 08/15/20 10/19/20  Wieters, Madelynn Done C, PA-C      Allergies    Patient has no known allergies.    Review of Systems   Review of Systems  Constitutional:  Negative for chills and fever.  Respiratory:  Negative for shortness of breath.   Gastrointestinal:  Positive for abdominal pain, diarrhea, nausea and vomiting.  All other systems reviewed and are negative.  Physical Exam Updated Vital Signs BP (!) 124/91 (BP Location: Left Arm)    Pulse (!) 102    Temp 98.5 F (36.9 C) (Oral)    Resp 16    SpO2 100%  Physical Exam Vitals and nursing note reviewed.  Constitutional:      General: She is not in acute distress.    Appearance: Normal appearance. She is not ill-appearing.  HENT:     Head: Normocephalic and atraumatic.     Nose: Nose normal.  Eyes:     Conjunctiva/sclera: Conjunctivae normal.  Cardiovascular:     Rate and Rhythm: Regular rhythm. Tachycardia present.  Pulmonary:      Effort: Pulmonary effort is normal. No respiratory distress.     Breath sounds: Normal breath sounds. No wheezing.  Abdominal:     General: There is no distension.     Tenderness: There is abdominal tenderness. There is guarding (Voluntary guarding). There is no right CVA tenderness or left CVA tenderness.  Musculoskeletal:        General: No deformity.     Cervical back: Normal range of motion.  Skin:    Findings: No rash.  Neurological:     Mental Status: She is alert.    ED Results / Procedures / Treatments   Labs (all labs ordered are listed, but only abnormal results are displayed) Labs Reviewed  LIPASE, BLOOD  COMPREHENSIVE METABOLIC PANEL  URINALYSIS, ROUTINE W REFLEX MICROSCOPIC  CBC WITH DIFFERENTIAL/PLATELET    EKG None  Radiology No results found.  Procedures Procedures    Medications Ordered in ED Medications  morphine (PF) 4 MG/ML injection 4 mg (has no administration in time range)  ondansetron (ZOFRAN) injection 4 mg (has no administration in time range)  lactated ringers bolus 1,000 mL (has no administration in time range)    ED Course/ Medical Decision Making/ A&P                           Medical Decision Making Amount and/or Complexity of Data Reviewed Labs: ordered.  Risk Prescription drug management.   Medical Decision Making / ED Course   This patient presents to the ED for concern of abdominal pain, nausea, vomiting, this involves an extensive number of treatment options, and is a complaint that carries with it a high risk of complications and morbidity.  The differential diagnosis includes acute intra-abdominal process, colitis, gastroenteritis  MDM: 56 year old female presents today for evaluation of abdominal pain, nausea, vomiting of 6-day duration.  Patient was evaluated Sunday in the emergency room with a negative CT scan at that time, she was afebrile.  At that time she was evaluated by hospitalist and was deemed appropriate for  discharge.  Patient reports she has persistent and worsening symptoms since she was discharged.  On exam patient is in tears, crying, and writhing in pain and screaming "help me".  CBC, CMP, lipase ordered.  Morphine, Zofran, IV hydration ordered. 4431: Mild improvement following morphine and Zofran on reevaluation. 0900: Patient remains symptomatic with an episode of emesis while in the emergency room.  Remains tender to palpation of her abdomen.  Refusing to have repeat CT scan done at this time.  We will provide additional morphine for symptom control.  CBC with mild leukocytosis slight uptrend since recent visit.  CMP with mild hypokalemia at 3.2.  Lipase of 63 which is downtrending from recent visit.  Patient likely has worsening of her gastroenteritis that she was diagnosed with recent visit.  Given recent unremarkable CT scan we will hold off for right now given patient is afebrile and without any new symptoms.  Patient concerned about going home.  States she wanted to be admitted last time and was hesitant to go home.  Will discuss with hospitalist.  Discussed with hospitalist will evaluate patient for admission.   Lab Tests: -I ordered, reviewed, and interpreted labs.   The pertinent results include:   Labs Reviewed  LIPASE, BLOOD  COMPREHENSIVE METABOLIC PANEL  URINALYSIS, ROUTINE W REFLEX MICROSCOPIC  CBC WITH DIFFERENTIAL/PLATELET      EKG  EKG Interpretation  Date/Time:    Ventricular Rate:    PR Interval:    QRS Duration:   QT Interval:    QTC Calculation:   R Axis:     Text Interpretation:           Medicines ordered and prescription drug management: Meds ordered this encounter  Medications   morphine (PF) 4 MG/ML injection 4 mg   ondansetron (ZOFRAN) injection 4 mg   lactated ringers bolus 1,000 mL    -I have reviewed the patients home medicines and have made adjustments as needed  Cardiac Monitoring: The patient was maintained on a cardiac monitor.  I  personally viewed and interpreted the cardiac monitored which showed an underlying rhythm of: sinus tachycardia improved to NSR after  IV hydration.   Reevaluation: After the interventions noted above, I reevaluated the patient and found that they have :improved Mildly improved abdominal pain following pain medication.  However remains tender to palpation and reports pain and is requesting additional pain medicine.  Co morbidities that complicate the patient evaluation  Past Medical History:  Diagnosis Date   Anxiety    Arthritis    rheumatoid , osteoarthritis   Bronchitis    Chronic back pain    Depression    Diabetes mellitus    Fatty liver    Fibromyalgia    GERD (gastroesophageal reflux disease)    Headache    Hypertension    Pancreatitis    Pneumonia    Rotator cuff tear, right       Dispostion: Patient discussed with hospitalist for potential admission given ongoing and persistent symptoms since discharge 1/29.   Final Clinical Impression(s) / ED Diagnoses Final diagnoses:  Generalized abdominal pain  Nausea vomiting and diarrhea    Rx / DC Orders ED Discharge Orders     None         Evlyn Courier, PA-C 09/12/21 Amenia, Ankit, MD 09/12/21 1028

## 2021-09-13 DIAGNOSIS — F32A Depression, unspecified: Secondary | ICD-10-CM | POA: Diagnosis present

## 2021-09-13 DIAGNOSIS — Z841 Family history of disorders of kidney and ureter: Secondary | ICD-10-CM | POA: Diagnosis not present

## 2021-09-13 DIAGNOSIS — D3502 Benign neoplasm of left adrenal gland: Secondary | ICD-10-CM

## 2021-09-13 DIAGNOSIS — E6609 Other obesity due to excess calories: Secondary | ICD-10-CM | POA: Diagnosis present

## 2021-09-13 DIAGNOSIS — M069 Rheumatoid arthritis, unspecified: Secondary | ICD-10-CM | POA: Diagnosis present

## 2021-09-13 DIAGNOSIS — E876 Hypokalemia: Secondary | ICD-10-CM | POA: Diagnosis present

## 2021-09-13 DIAGNOSIS — R112 Nausea with vomiting, unspecified: Secondary | ICD-10-CM | POA: Diagnosis present

## 2021-09-13 DIAGNOSIS — Z6836 Body mass index (BMI) 36.0-36.9, adult: Secondary | ICD-10-CM | POA: Diagnosis not present

## 2021-09-13 DIAGNOSIS — K859 Acute pancreatitis without necrosis or infection, unspecified: Secondary | ICD-10-CM | POA: Diagnosis present

## 2021-09-13 DIAGNOSIS — Z833 Family history of diabetes mellitus: Secondary | ICD-10-CM | POA: Diagnosis not present

## 2021-09-13 DIAGNOSIS — E119 Type 2 diabetes mellitus without complications: Secondary | ICD-10-CM | POA: Diagnosis present

## 2021-09-13 DIAGNOSIS — M797 Fibromyalgia: Secondary | ICD-10-CM | POA: Diagnosis present

## 2021-09-13 DIAGNOSIS — Z90710 Acquired absence of both cervix and uterus: Secondary | ICD-10-CM | POA: Diagnosis not present

## 2021-09-13 DIAGNOSIS — M1990 Unspecified osteoarthritis, unspecified site: Secondary | ICD-10-CM | POA: Diagnosis present

## 2021-09-13 DIAGNOSIS — I10 Essential (primary) hypertension: Secondary | ICD-10-CM | POA: Diagnosis present

## 2021-09-13 DIAGNOSIS — K219 Gastro-esophageal reflux disease without esophagitis: Secondary | ICD-10-CM | POA: Diagnosis present

## 2021-09-13 DIAGNOSIS — F419 Anxiety disorder, unspecified: Secondary | ICD-10-CM | POA: Diagnosis present

## 2021-09-13 DIAGNOSIS — G8929 Other chronic pain: Secondary | ICD-10-CM | POA: Diagnosis present

## 2021-09-13 DIAGNOSIS — M549 Dorsalgia, unspecified: Secondary | ICD-10-CM | POA: Diagnosis present

## 2021-09-13 DIAGNOSIS — Z8249 Family history of ischemic heart disease and other diseases of the circulatory system: Secondary | ICD-10-CM | POA: Diagnosis not present

## 2021-09-13 DIAGNOSIS — Z981 Arthrodesis status: Secondary | ICD-10-CM | POA: Diagnosis not present

## 2021-09-13 DIAGNOSIS — Z9049 Acquired absence of other specified parts of digestive tract: Secondary | ICD-10-CM | POA: Diagnosis not present

## 2021-09-13 DIAGNOSIS — F17210 Nicotine dependence, cigarettes, uncomplicated: Secondary | ICD-10-CM | POA: Diagnosis present

## 2021-09-13 DIAGNOSIS — Z20822 Contact with and (suspected) exposure to covid-19: Secondary | ICD-10-CM | POA: Diagnosis present

## 2021-09-13 DIAGNOSIS — K760 Fatty (change of) liver, not elsewhere classified: Secondary | ICD-10-CM | POA: Diagnosis present

## 2021-09-13 LAB — COMPREHENSIVE METABOLIC PANEL
ALT: 17 U/L (ref 0–44)
AST: 16 U/L (ref 15–41)
Albumin: 3.7 g/dL (ref 3.5–5.0)
Alkaline Phosphatase: 74 U/L (ref 38–126)
Anion gap: 7 (ref 5–15)
BUN: 9 mg/dL (ref 6–20)
CO2: 25 mmol/L (ref 22–32)
Calcium: 8.3 mg/dL — ABNORMAL LOW (ref 8.9–10.3)
Chloride: 105 mmol/L (ref 98–111)
Creatinine, Ser: 0.64 mg/dL (ref 0.44–1.00)
GFR, Estimated: >60 mL/min (ref >60)
Glucose, Bld: 100 mg/dL — ABNORMAL HIGH (ref 70–99)
Potassium: 3 mmol/L — ABNORMAL LOW (ref 3.5–5.1)
Sodium: 137 mmol/L (ref 135–145)
Total Bilirubin: 1 mg/dL (ref 0.3–1.2)
Total Protein: 6.5 g/dL (ref 6.5–8.1)

## 2021-09-13 LAB — CBC
HCT: 40 % (ref 36.0–46.0)
Hemoglobin: 13.5 g/dL (ref 12.0–15.0)
MCH: 28.8 pg (ref 26.0–34.0)
MCHC: 33.8 g/dL (ref 30.0–36.0)
MCV: 85.3 fL (ref 80.0–100.0)
Platelets: 328 10*3/uL (ref 150–400)
RBC: 4.69 MIL/uL (ref 3.87–5.11)
RDW: 13.9 % (ref 11.5–15.5)
WBC: 11.7 10*3/uL — ABNORMAL HIGH (ref 4.0–10.5)
nRBC: 0 % (ref 0.0–0.2)

## 2021-09-13 LAB — GLUCOSE, CAPILLARY
Glucose-Capillary: 105 mg/dL — ABNORMAL HIGH (ref 70–99)
Glucose-Capillary: 210 mg/dL — ABNORMAL HIGH (ref 70–99)
Glucose-Capillary: 127 mg/dL — ABNORMAL HIGH (ref 70–99)
Glucose-Capillary: 273 mg/dL — ABNORMAL HIGH (ref 70–99)

## 2021-09-13 LAB — HEMOGLOBIN A1C
Hgb A1c MFr Bld: 9.9 % — ABNORMAL HIGH (ref 4.8–5.6)
Mean Plasma Glucose: 237 mg/dL

## 2021-09-13 LAB — HIV ANTIBODY (ROUTINE TESTING W REFLEX): HIV Screen 4th Generation wRfx: NONREACTIVE

## 2021-09-13 MED ORDER — ASPIRIN EC 81 MG PO TBEC
81.0000 mg | DELAYED_RELEASE_TABLET | Freq: Every day | ORAL | Status: DC
  Administered 2021-09-13 – 2021-09-14 (×2): 81 mg via ORAL
  Filled 2021-09-13 (×2): qty 1

## 2021-09-13 MED ORDER — SIMVASTATIN 20 MG PO TABS
20.0000 mg | ORAL_TABLET | Freq: Every day | ORAL | Status: DC
  Administered 2021-09-13 – 2021-09-14 (×2): 20 mg via ORAL
  Filled 2021-09-13 (×2): qty 1

## 2021-09-13 MED ORDER — INSULIN GLARGINE-YFGN 100 UNIT/ML ~~LOC~~ SOLN
30.0000 [IU] | Freq: Every day | SUBCUTANEOUS | Status: DC
  Administered 2021-09-13 – 2021-09-14 (×2): 30 [IU] via SUBCUTANEOUS
  Filled 2021-09-13 (×2): qty 0.3

## 2021-09-13 MED ORDER — METOPROLOL SUCCINATE ER 50 MG PO TB24
50.0000 mg | ORAL_TABLET | Freq: Every day | ORAL | Status: DC
  Administered 2021-09-13 – 2021-09-14 (×2): 50 mg via ORAL
  Filled 2021-09-13 (×2): qty 1

## 2021-09-13 MED ORDER — HYDRALAZINE HCL 20 MG/ML IJ SOLN: 10.0000 mg | Freq: Three times a day (TID) | INTRAMUSCULAR | Status: DC | PRN

## 2021-09-13 MED ORDER — ESCITALOPRAM OXALATE 20 MG PO TABS
20.0000 mg | ORAL_TABLET | Freq: Every day | ORAL | Status: DC
  Administered 2021-09-13 – 2021-09-14 (×2): 20 mg via ORAL
  Filled 2021-09-13 (×2): qty 1

## 2021-09-13 MED ORDER — ENOXAPARIN SODIUM 60 MG/0.6ML IJ SOSY
50.0000 mg | PREFILLED_SYRINGE | INTRAMUSCULAR | Status: DC
  Administered 2021-09-13 – 2021-09-14 (×2): 50 mg via SUBCUTANEOUS
  Filled 2021-09-13 (×2): qty 0.6

## 2021-09-13 MED ORDER — HYDROCODONE-ACETAMINOPHEN 5-325 MG PO TABS
1.0000 | ORAL_TABLET | ORAL | Status: DC | PRN
  Administered 2021-09-14: 2 via ORAL
  Filled 2021-09-13 (×2): qty 1

## 2021-09-13 MED ORDER — POTASSIUM CHLORIDE CRYS ER 20 MEQ PO TBCR
40.0000 meq | EXTENDED_RELEASE_TABLET | ORAL | Status: AC
  Administered 2021-09-13: 40 meq via ORAL
  Filled 2021-09-13 (×2): qty 2

## 2021-09-13 MED ORDER — ALBUTEROL SULFATE (2.5 MG/3ML) 0.083% IN NEBU: 2.5000 mg | INHALATION_SOLUTION | Freq: Four times a day (QID) | RESPIRATORY_TRACT | Status: DC | PRN

## 2021-09-13 MED ORDER — INSULIN GLARGINE 100 UNITS/ML SOLOSTAR PEN: 30.0000 [IU] | PEN_INJECTOR | Freq: Every day | SUBCUTANEOUS | Status: DC

## 2021-09-13 MED ORDER — ALPRAZOLAM 1 MG PO TABS: 1.0000 mg | ORAL_TABLET | Freq: Every day | ORAL | Status: DC | PRN

## 2021-09-13 MED ORDER — POTASSIUM CHLORIDE CRYS ER 20 MEQ PO TBCR
40.0000 meq | EXTENDED_RELEASE_TABLET | Freq: Once | ORAL | Status: AC
  Administered 2021-09-13: 40 meq via ORAL
  Filled 2021-09-13: qty 2

## 2021-09-13 NOTE — Assessment & Plan Note (Addendum)
Potassium supplementation given. Resolved. °

## 2021-09-13 NOTE — Assessment & Plan Note (Addendum)
Patient is on Toprol XL, amlodipine-olmesartan as an outpatient. Continue on discharge.

## 2021-09-13 NOTE — Assessment & Plan Note (Signed)
Small and stable. Outpatient follow-up.

## 2021-09-13 NOTE — Hospital Course (Addendum)
[name] is a 56 y.o. female with a history of anxiety, depression, rheumatoid arthritis, diabetes mellitus type 2, chronic back pain. Patient presented secondary to recurrent abdominal pain, nausea and vomiting of uncertain etiology. Imaging unremarkable. Initial bowel rest with improvement of symptoms. Advanced diet successfully.

## 2021-09-13 NOTE — Progress Notes (Signed)
PROGRESS NOTE    [name]  XLK:[id number] DOB: 31-Aug-1965 DOA: 09/12/2021 PCP: Nolene Ebbs, MD   Brief Narrative: [name] is a 56 y.o. female with a history of anxiety, depression, rheumatoid arthritis, diabetes mellitus type 2, chronic back pain. Patient presented secondary to recurrent abdominal pain, nausea and vomiting of uncertain etiology. Imaging unremarkable. Initial bowel rest with improvement of symptoms. Advancing diet.   Assessment and Plan: * Intractable nausea and vomiting- (present on admission) Associated abdominal pain. Uncertain etiology but possibly pancreatitis related. History of gastric emptying study in 2020 which was normal. Symptoms improving at this time. -Continue Phenergan/compazine prn -Continue IV fluids pending continued toleration of oral intake  Adrenal adenoma, left Small and stable. Outpatient follow-up.  Anxiety -Resume home Xanax prn  Hypokalemia -Potassium supplementation  Class 2 obesity due to excess calories with body mass index (BMI) of 36.0 to 36.9 in adult Body mass index is 36.71 kg/m.  Diabetes mellitus type 2 in obese [facility])- (present on admission) Hemoglobin A1C of 9.9%. Patient is on metformin, Januvia and Lantus as an outpatient -Semglee 30 units daily -Continue SSI  Essential hypertension- (present on admission) Patient is on Toprol XL, amlodipine-olmesartan as an outpatient -Resume home Toprol XL and hold amlodipine-olmesartan for now     DVT prophylaxis: Lovenox Code Status:   Code Status: Full Code Family Communication: None at bedside Disposition Plan: Discharge home likely in 24 hours if remains able to tolerate oral intake   Consultants:  None  Procedures:  None  Antimicrobials: None    Subjective: Abdominal pain is improved from prior. No nausea or vomiting overnight.  Objective: Vitals:   09/12/21 2131 09/13/21 0059 09/13/21 0423 09/13/21 1318  BP: 122/63 131/76  139/78 (!) 145/82  Pulse: 87 81 85 85  Resp: 16 19 20 18   Temp: 98.8 F (37.1 C) 98.9 F (37.2 C) 98.8 F (37.1 C) 98.5 F (36.9 C)  TempSrc:  Oral  Oral  SpO2: 96% 94% 96% 95%  Weight:      Height:       No intake or output data in the 24 hours ending 09/13/21 1516 Filed Weights   09/12/21 1532  Weight: 97 kg    Examination:  General exam: Appears calm and comfortable Respiratory system: Clear to auscultation. Respiratory effort normal. Cardiovascular system: S1 & S2 heard, RRR. No murmurs, rubs, gallops or clicks. Gastrointestinal system: Abdomen is nondistended, soft and extremely tender to light palpation in epigastric region. Normal bowel sounds heard. Central nervous system: Alert and oriented. No focal neurological deficits. Musculoskeletal: No edema. No calf tenderness Skin: No cyanosis. No rashes Psychiatry: Judgement and insight appear normal. Mood & affect appropriate.     Data Reviewed: I have personally reviewed following labs and imaging studies  CBC Lab Results  Component Value Date   WBC 11.7 (H) 09/13/2021   RBC 4.69 09/13/2021   HGB 13.5 09/13/2021   HCT 40.0 09/13/2021   MCV 85.3 09/13/2021   MCH 28.8 09/13/2021   PLT 328 09/13/2021   MCHC 33.8 09/13/2021   RDW 13.9 09/13/2021   LYMPHSABS 3.3 09/12/2021   MONOABS 0.6 09/12/2021   EOSABS 0.0 09/12/2021   BASOSABS 0.1 36/64/4034     Last metabolic panel Lab Results  Component Value Date   NA 137 09/13/2021   K 3.0 (L) 09/13/2021   CL 105 09/13/2021   CO2 25 09/13/2021   BUN 9 09/13/2021   CREATININE 0.64 09/13/2021   GLUCOSE 100 (H) 09/13/2021  GFRNONAA >60 09/13/2021   GFRAA 102 12/21/2020   CALCIUM 8.3 (L) 09/13/2021   PHOS 4.0 11/24/2019   PROT 6.5 09/13/2021   ALBUMIN 3.7 09/13/2021   LABGLOB 2.5 08/15/2020   AGRATIO 2.0 08/15/2020   BILITOT 1.0 09/13/2021   ALKPHOS 74 09/13/2021   AST 16 09/13/2021   ALT 17 09/13/2021   ANIONGAP 7 09/13/2021    CBG (last 3)  Recent  Labs    09/12/21 2132 09/13/21 0715 09/13/21 1119  GLUCAP 133* 105* 210*     GFR: Estimated Creatinine Clearance: 88.8 mL/min (by C-G formula based on SCr of 0.64 mg/dL).  Coagulation Profile: No results for input(s): INR, PROTIME in the last 168 hours.  Recent Results (from the past 240 hour(s))  Novel Coronavirus, NAA ([facility])     Status: None   Collection Time: 09/09/21  9:28 AM   Specimen: Nasopharyngeal(NP) swabs in vial transport medium   Nasopharynge  Result Value Ref Range Status   SARS-CoV-2, NAA Not Detected Not Detected Final    Comment: This nucleic acid amplification test was developed and its performance characteristics determined by [employer]. Nucleic acid amplification tests include RT-PCR and TMA. This test has not been FDA cleared or approved. This test has been authorized by FDA under an Emergency Use Authorization (EUA). This test is only authorized for the duration of time the declaration that circumstances exist justifying the authorization of the emergency use of in vitro diagnostic tests for detection of SARS-CoV-2 virus and/or diagnosis of COVID-19 infection under section 564(b)(1) of the Act, 21 U.S.C. 454UJW-1(X) (1), unless the authorization is terminated or revoked sooner. When diagnostic testing is negative, the possibility of a false negative result should be considered in the context of a patient's recent exposures and the presence of clinical signs and symptoms consistent with COVID-19. An individual without symptoms of COVID-19 and who is not shedding SARS-CoV-2 virus wo uld expect to have a negative (not detected) result in this assay.   SARS-COV-2, NAA 2 DAY TAT     Status: None   Collection Time: 09/09/21  9:28 AM   Nasopharynge  Result Value Ref Range Status   SARS-CoV-2, NAA 2 DAY TAT Performed  Final  Resp Panel by RT-PCR (Flu A&B, Covid) Nasopharyngeal Swab     Status: None   Collection Time: 09/09/21 11:40 AM    Specimen: Nasopharyngeal Swab; Nasopharyngeal(NP) swabs in vial transport medium  Result Value Ref Range Status   SARS Coronavirus 2 by RT PCR NEGATIVE NEGATIVE Final    Comment: (NOTE) SARS-CoV-2 target nucleic acids are NOT DETECTED.  The SARS-CoV-2 RNA is generally detectable in upper respiratory specimens during the acute phase of infection. The lowest concentration of SARS-CoV-2 viral copies this assay can detect is 138 copies/mL. A negative result does not preclude SARS-Cov-2 infection and should not be used as the sole basis for treatment or other patient management decisions. A negative result may occur with  improper specimen collection/handling, submission of specimen other than nasopharyngeal swab, presence of viral mutation(s) within the areas targeted by this assay, and inadequate number of viral copies(<138 copies/mL). A negative result must be combined with clinical observations, patient history, and epidemiological information. The expected result is Negative.  Fact Sheet for Patients:  EntrepreneurPulse.com.au  Fact Sheet for Healthcare Providers:  IncredibleEmployment.be  This test is no t yet approved or cleared by the [place] FDA and  has been authorized for detection and/or diagnosis of SARS-CoV-2 by FDA under an Emergency Use Authorization (  EUA). This EUA will remain  in effect (meaning this test can be used) for the duration of the COVID-19 declaration under Section 564(b)(1) of the Act, 21 U.S.C.section 360bbb-3(b)(1), unless the authorization is terminated  or revoked sooner.       Influenza A by PCR NEGATIVE NEGATIVE Final   Influenza B by PCR NEGATIVE NEGATIVE Final    Comment: (NOTE) The Xpert Xpress SARS-CoV-2/FLU/RSV plus assay is intended as an aid in the diagnosis of influenza from Nasopharyngeal swab specimens and should not be used as a sole basis for treatment. Nasal washings and aspirates are  unacceptable for Xpert Xpress SARS-CoV-2/FLU/RSV testing.  Fact Sheet for Patients: EntrepreneurPulse.com.au  Fact Sheet for Healthcare Providers: IncredibleEmployment.be  This test is not yet approved or cleared by the [place] FDA and has been authorized for detection and/or diagnosis of SARS-CoV-2 by FDA under an Emergency Use Authorization (EUA). This EUA will remain in effect (meaning this test can be used) for the duration of the COVID-19 declaration under Section 564(b)(1) of the Act, 21 U.S.C. section 360bbb-3(b)(1), unless the authorization is terminated or revoked.  Performed at [facility], [place] [address] [place], [place] [address]   Resp Panel by RT-PCR (Flu A&B, Covid) Nasopharyngeal Swab     Status: None   Collection Time: 09/12/21  9:11 AM   Specimen: Nasopharyngeal Swab; Nasopharyngeal(NP) swabs in vial transport medium  Result Value Ref Range Status   SARS Coronavirus 2 by RT PCR NEGATIVE NEGATIVE Final    Comment: (NOTE) SARS-CoV-2 target nucleic acids are NOT DETECTED.  The SARS-CoV-2 RNA is generally detectable in upper respiratory specimens during the acute phase of infection. The lowest concentration of SARS-CoV-2 viral copies this assay can detect is 138 copies/mL. A negative result does not preclude SARS-Cov-2 infection and should not be used as the sole basis for treatment or other patient management decisions. A negative result may occur with  improper specimen collection/handling, submission of specimen other than nasopharyngeal swab, presence of viral mutation(s) within the areas targeted by this assay, and inadequate number of viral copies(<138 copies/mL). A negative result must be combined with clinical observations, patient history, and epidemiological information. The expected result is Negative.  Fact Sheet for Patients:  EntrepreneurPulse.com.au  Fact Sheet for Healthcare  Providers:  IncredibleEmployment.be  This test is no t yet approved or cleared by the [place] FDA and  has been authorized for detection and/or diagnosis of SARS-CoV-2 by FDA under an Emergency Use Authorization (EUA). This EUA will remain  in effect (meaning this test can be used) for the duration of the COVID-19 declaration under Section 564(b)(1) of the Act, 21 U.S.C.section 360bbb-3(b)(1), unless the authorization is terminated  or revoked sooner.       Influenza A by PCR NEGATIVE NEGATIVE Final   Influenza B by PCR NEGATIVE NEGATIVE Final    Comment: (NOTE) The Xpert Xpress SARS-CoV-2/FLU/RSV plus assay is intended as an aid in the diagnosis of influenza from Nasopharyngeal swab specimens and should not be used as a sole basis for treatment. Nasal washings and aspirates are unacceptable for Xpert Xpress SARS-CoV-2/FLU/RSV testing.  Fact Sheet for Patients: EntrepreneurPulse.com.au  Fact Sheet for Healthcare Providers: IncredibleEmployment.be  This test is not yet approved or cleared by the [place] FDA and has been authorized for detection and/or diagnosis of SARS-CoV-2 by FDA under an Emergency Use Authorization (EUA). This EUA will remain in effect (meaning this test can be used) for the duration of the COVID-19 declaration under  Section 564(b)(1) of the Act, 21 U.S.C. section 360bbb-3(b)(1), unless the authorization is terminated or revoked.  Performed at [facility], [place] [address], [place], [place] [address]         Radiology Studies: DG Abd 1 View  Result Date: 09/12/2021 CLINICAL DATA:  56 year old female with intractable epigastric pain. Nausea vomiting. EXAM: ABDOMEN - 1 VIEW COMPARISON:  CT Abdomen and Pelvis 09/09/2021. FINDINGS: Stable cholecystectomy clips. Chronic postoperative changes to the lower lumbar spine and lumbosacral junction. Non obstructed bowel gas pattern.  Normal abdominal and pelvic visceral contours. No acute osseous abnormality identified. IMPRESSION: Negative. Electronically Signed   By: Genevie Ann M.D.   On: 09/12/2021 11:05        Scheduled Meds:  aspirin EC  81 mg Oral Daily   dicyclomine  10 mg Oral TID AC   enoxaparin (LOVENOX) injection  50 mg Subcutaneous Q24H   escitalopram  20 mg Oral Daily   insulin aspart  0-15 Units Subcutaneous TID WC   insulin glargine-yfgn  30 Units Subcutaneous Q1400   metoprolol succinate  50 mg Oral Daily   pantoprazole (PROTONIX) IV  40 mg Intravenous Daily   potassium chloride  40 mEq Oral Q4H   simvastatin  20 mg Oral Daily   Continuous Infusions:  sodium chloride 100 mL/hr at 09/13/21 0641   promethazine (PHENERGAN) injection (IM or IVPB) 12.5 mg (09/13/21 0906)     LOS: 0 days     Cordelia Poche, MD [facility] 09/13/2021, 3:16 PM  If 7PM-7AM, please contact night-coverage [email or web address]

## 2021-09-13 NOTE — Assessment & Plan Note (Addendum)
Associated abdominal pain. Uncertain etiology but possibly pancreatitis related. History of gastric emptying study in 2020 which was normal. Symptoms resolved. Abdominal pain improved.

## 2021-09-13 NOTE — Assessment & Plan Note (Addendum)
Hemoglobin A1C of 9.9%. Patient is on metformin, Januvia and Lantus as an outpatient. Managed on Semglee 30 units while inpatient. Resume home Lantus. Recommended to discontinue Januvia.

## 2021-09-13 NOTE — Assessment & Plan Note (Addendum)
Continue home Xanax prn

## 2021-09-13 NOTE — Assessment & Plan Note (Signed)
Body mass index is 36.71 kg/m.

## 2021-09-14 DIAGNOSIS — K859 Acute pancreatitis without necrosis or infection, unspecified: Principal | ICD-10-CM

## 2021-09-14 LAB — POTASSIUM: Potassium: 4.2 mmol/L (ref 3.5–5.1)

## 2021-09-14 LAB — GLUCOSE, CAPILLARY
Glucose-Capillary: 182 mg/dL — ABNORMAL HIGH (ref 70–99)
Glucose-Capillary: 167 mg/dL — ABNORMAL HIGH (ref 70–99)

## 2021-09-14 MED ORDER — HYDROCODONE-ACETAMINOPHEN 5-325 MG PO TABS: 1.0000 | ORAL_TABLET | Freq: Four times a day (QID) | ORAL | 0 refills | Status: DC | PRN

## 2021-09-14 NOTE — Discharge Instructions (Signed)
[name],  You were in the hospital with abdominal pain which seems most likely related to pancreatitis. This has improved with bowel rest and pain medication. Please limit your fatty foods. I have also recommended to stop Januvia as this has a risk to increase the chance of pancreatitis.

## 2021-09-14 NOTE — Progress Notes (Signed)
Patient discharged home via family transport. AVS summary reviewed and given to patient.

## 2021-09-14 NOTE — Discharge Summary (Signed)
Physician Discharge Summary   [name] MRN: [id number] DOB: 1965/08/29  Admit date:     09/12/2021  Discharge date: 09/14/21  Discharge Physician: Cordelia Poche, MD   PCP: Nolene Ebbs, MD   Recommendations at discharge:   PCP and GI follow-up Januvia discontinued  Discharge Diagnoses: Principal Problem:   Acute pancreatitis Active Problems:   Adrenal adenoma, left   Essential hypertension   Diabetes mellitus type 2 in obese ([place])   Class 2 obesity due to excess calories with body mass index (BMI) of 36.0 to 36.9 in adult   Intractable nausea and vomiting   Anxiety  Resolved Problems:   Hypokalemia   Hospital Course: [name] is a 56 y.o. female with a history of anxiety, depression, rheumatoid arthritis, diabetes mellitus type 2, chronic back pain. Patient presented secondary to recurrent abdominal pain, nausea and vomiting of uncertain etiology. Imaging unremarkable. Initial bowel rest with improvement of symptoms. Advanced diet successfully.  Assessment and Plan: * Acute pancreatitis- (present on admission) Uncertain etiology. Recurrent issue. Pain managed with bowel rest and analgesics successfully. Diet advanced successfully. Januvia discontinued on discharge. Patient has GI follow-up at [facility].  Adrenal adenoma, left Small and stable. Outpatient follow-up.  Anxiety -Resume home Xanax prn  Hypokalemia -Potassium supplementation  Intractable nausea and vomiting- (present on admission) Associated abdominal pain. Uncertain etiology but possibly pancreatitis related. History of gastric emptying study in 2020 which was normal. Symptoms resolved. Abdominal pain improved.  Class 2 obesity due to excess calories with body mass index (BMI) of 36.0 to 36.9 in adult Body mass index is 36.71 kg/m.  Diabetes mellitus type 2 in obese [facility])- (present on admission) Hemoglobin A1C of 9.9%. Patient is on metformin, Januvia and Lantus as an  outpatient -Semglee 30 units daily -Continue SSI  Essential hypertension- (present on admission) Patient is on Toprol XL, amlodipine-olmesartan as an outpatient -Resume home Toprol XL and hold amlodipine-olmesartan for now    Consultants: None Procedures performed: None  Disposition: Home Diet recommendation:  Low fat  DISCHARGE MEDICATION: Allergies as of 09/14/2021   No Known Allergies      Medication List     STOP taking these medications    gabapentin 100 MG capsule Commonly known as: NEURONTIN   Januvia 100 MG tablet Generic drug: sitaGLIPtin   sucralfate 1 g tablet Commonly known as: Carafate       TAKE these medications    acetaminophen 500 MG tablet Commonly known as: TYLENOL Take 500 mg by mouth 2 (two) times daily as needed for moderate pain.   albuterol (2.5 MG/3ML) 0.083% nebulizer solution Commonly known as: PROVENTIL Take 3 mLs (2.5 mg total) by nebulization every 6 (six) hours as needed for wheezing or shortness of breath.   albuterol 108 (90 Base) MCG/ACT inhaler Commonly known as: VENTOLIN HFA Inhale 2 puffs into the lungs every 4 (four) hours as needed for wheezing.   ALPRAZolam 1 MG tablet Commonly known as: XANAX Take 1 mg by mouth daily as needed for anxiety.   amLODipine-olmesartan 5-40 MG tablet Commonly known as: AZOR Take 1 tablet by mouth daily.   aspirin EC 81 MG tablet Take 81 mg by mouth daily. Swallow whole.   celecoxib 200 MG capsule Commonly known as: CeleBREX Take 1 capsule (200 mg total) by mouth 2 (two) times daily.   CENTRUM PO Take 1 tablet by mouth daily.   cetirizine 10 MG tablet Commonly known as: ZYRTEC Take 10 mg by mouth daily as needed  for allergies.   cyclobenzaprine 10 MG tablet Commonly known as: FLEXERIL Take 1 tablet (10 mg total) by mouth 2 (two) times daily as needed for muscle spasms.   dicyclomine 20 MG tablet Commonly known as: BENTYL Take 1 tablet (20 mg total) by mouth 2 (two) times  daily.   escitalopram 20 MG tablet Commonly known as: LEXAPRO Take 20 mg by mouth daily.   fluticasone 50 MCG/ACT nasal spray Commonly known as: FLONASE Place 1 spray into both nostrils daily as needed for allergies.   HYDROcodone-acetaminophen 5-325 MG tablet Commonly known as: NORCO/VICODIN Take 1 tablet by mouth every 6 (six) hours as needed for moderate pain.   insulin glargine 100 unit/mL Sopn Commonly known as: LANTUS Inject 60 Units into the skin daily at 2 PM.   loperamide 2 MG capsule Commonly known as: IMODIUM Take 1 capsule (2 mg total) by mouth 4 (four) times daily as needed for diarrhea or loose stools.   metFORMIN 1000 MG tablet Commonly known as: GLUCOPHAGE Take 1,000 mg by mouth 2 (two) times daily.   metoprolol succinate 50 MG 24 hr tablet Commonly known as: TOPROL-XL Take 50 mg by mouth daily.   ondansetron 4 MG disintegrating tablet Commonly known as: Zofran ODT Take 1 tablet (4 mg total) by mouth every 8 (eight) hours as needed for nausea or vomiting.   pantoprazole 20 MG tablet Commonly known as: PROTONIX Take 1 tablet (20 mg total) by mouth daily.   simvastatin 20 MG tablet Commonly known as: ZOCOR Take 20 mg by mouth daily.        Follow-up Information     Nolene Ebbs, MD. Schedule an appointment as soon as possible for a visit in 1 week(s).   Specialty: Internal Medicine Why: For hospital follow-up Contact information: [address] [place] [address] [phone]                 Discharge Exam: Filed Weights   09/12/21 1532  Weight: 97 kg   General exam: Appears calm and comfortable  Respiratory system: Clear to auscultation. Respiratory effort normal. Cardiovascular system: S1 & S2 heard, RRR. No murmurs, rubs, gallops or clicks. Gastrointestinal system: Abdomen is nondistended, soft and mildly tender. No organomegaly or masses felt. Normal bowel sounds heard. Central nervous system: Alert and oriented. No  focal neurological deficits. Musculoskeletal: No edema. No calf tenderness Skin: No cyanosis. No rashes Psychiatry: Judgement and insight appear normal. Mood & affect appropriate.   Condition at discharge: good  The results of significant diagnostics from this hospitalization (including imaging, microbiology, ancillary and laboratory) are listed below for reference.   Imaging Studies: DG Abd 1 View  Result Date: 09/12/2021 CLINICAL DATA:  56 year old female with intractable epigastric pain. Nausea vomiting. EXAM: ABDOMEN - 1 VIEW COMPARISON:  CT Abdomen and Pelvis 09/09/2021. FINDINGS: Stable cholecystectomy clips. Chronic postoperative changes to the lower lumbar spine and lumbosacral junction. Non obstructed bowel gas pattern. Normal abdominal and pelvic visceral contours. No acute osseous abnormality identified. IMPRESSION: Negative. Electronically Signed   By: Genevie Ann M.D.   On: 09/12/2021 11:05   CT Abdomen Pelvis W Contrast  Result Date: 09/09/2021 CLINICAL DATA:  Abdominal pain EXAM: CT ABDOMEN AND PELVIS WITH CONTRAST TECHNIQUE: Multidetector CT imaging of the abdomen and pelvis was performed using the standard protocol following bolus administration of intravenous contrast. RADIATION DOSE REDUCTION: This exam was performed according to the departmental dose-optimization program which includes automated exposure control, adjustment of the mA and/or kV according to  patient size and/or use of iterative reconstruction technique. CONTRAST:  133mL OMNIPAQUE IOHEXOL 300 MG/ML  SOLN COMPARISON:  June 22, 2021 FINDINGS: Lower chest: No acute abnormality. Hepatobiliary: No suspicious hepatic lesion. Hepatic steatosis. Gallbladder surgically absent. No biliary ductal dilation. Pancreas: No pancreatic ductal dilation or evidence of acute inflammation. Spleen: Within normal limits. Adrenals/Urinary Tract: Stable small left adrenal nodules consistent with adenomas. Right adrenal glands unremarkable. No  hydronephrosis. Kidneys demonstrate symmetric enhancement and excretion of contrast material. No solid enhancing renal mass. Urinary bladder is unremarkable for degree of distension. Stomach/Bowel: No enteric contrast was administered. Stomach is unremarkable for degree of distension. No pathologic dilation of small or large bowel. The appendix and terminal ileum appear normal. No evidence of acute bowel inflammation. Vascular/Lymphatic: Scattered aortic atherosclerosis without abdominal aortic aneurysm. No pathologically enlarged abdominal or pelvic lymph nodes. Reproductive: Status post hysterectomy. No suspicious adnexal masses. Other: Diastasis rectus. Small fat containing ventral hernia. No significant abdominopelvic free fluid. Musculoskeletal: L4-L5 and L5-S1 interbody disc spacers. Mild thoracolumbar spondylosis. Mild degenerative change of the hips. No acute osseous abnormality. IMPRESSION: 1. No acute abnormality in the abdomen or pelvis. 2. Hepatic steatosis. 3. Stable small left adrenal adenomas. 4. Small fat containing ventral hernia. 5. Aortic Atherosclerosis (ICD10-I70.0). Electronically Signed   By: Dahlia Bailiff M.D.   On: 09/09/2021 13:40   DG Chest Port 1 View  Result Date: 09/09/2021 CLINICAL DATA:  Cough. EXAM: PORTABLE CHEST 1 VIEW COMPARISON:  Dec 12, 2019. FINDINGS: Stable cardiomediastinal silhouette. Both lungs are clear. The visualized skeletal structures are unremarkable. IMPRESSION: No active disease. Electronically Signed   By: Marijo Conception M.D.   On: 09/09/2021 12:00   MM 3D SCREEN BREAST BILATERAL  Result Date: 08/22/2021 CLINICAL DATA:  Screening. EXAM: DIGITAL SCREENING BILATERAL MAMMOGRAM WITH TOMOSYNTHESIS AND CAD TECHNIQUE: Bilateral screening digital craniocaudal and mediolateral oblique mammograms were obtained. Bilateral screening digital breast tomosynthesis was performed. The images were evaluated with computer-aided detection. COMPARISON:  Previous exam(s). ACR  Breast Density Category b: There are scattered areas of fibroglandular density. FINDINGS: There are no findings suspicious for malignancy. IMPRESSION: No mammographic evidence of malignancy. A result letter of this screening mammogram will be mailed directly to the patient. RECOMMENDATION: Screening mammogram in one year. (Code:[id number]) BI-RADS CATEGORY  1: Negative. Electronically Signed   By: Abelardo Diesel M.D.   On: 08/22/2021 09:15   XR Cervical Spine 2 or 3 views  Result Date: 09/03/2021 AP and lateral flexion and extension radiographs show previous ACDF C5-6 with plate and screws and C6-7 with Zero P implant no motion noted across these segments. There is minimal narrowing of the disc at C4-5 with anterior syndesmosises at the remaining 3 levels above the fusion site. No acute findings.   XR Lumbar Spine 2-3 Views  Result Date: 09/03/2021 AP and lateral flexion and extension radiographs show ray anterior cage fusions at the L4-5 and L5-S1 levels With small clips from previous anterior approach to fusion. With flexion and extension there is no motion across these Levels with anterior sentinel sign ov bone bridging. The L3-4 level has a spondylolisthesis that is grade 1 and increases from 3-4 mm to nearly 6-7 mm. There is disc height narrowing at this level and endplate sclerosis.    Microbiology: Results for orders placed or performed during the hospital encounter of 09/12/21  Resp Panel by RT-PCR (Flu A&B, Covid) Nasopharyngeal Swab     Status: None   Collection Time: 09/12/21  9:11 AM   Specimen:  Nasopharyngeal Swab; Nasopharyngeal(NP) swabs in vial transport medium  Result Value Ref Range Status   SARS Coronavirus 2 by RT PCR NEGATIVE NEGATIVE Final    Comment: (NOTE) SARS-CoV-2 target nucleic acids are NOT DETECTED.  The SARS-CoV-2 RNA is generally detectable in upper respiratory specimens during the acute phase of infection. The lowest concentration of SARS-CoV-2 viral copies this  assay can detect is 138 copies/mL. A negative result does not preclude SARS-Cov-2 infection and should not be used as the sole basis for treatment or other patient management decisions. A negative result may occur with  improper specimen collection/handling, submission of specimen other than nasopharyngeal swab, presence of viral mutation(s) within the areas targeted by this assay, and inadequate number of viral copies(<138 copies/mL). A negative result must be combined with clinical observations, patient history, and epidemiological information. The expected result is Negative.  Fact Sheet for Patients:  EntrepreneurPulse.com.au  Fact Sheet for Healthcare Providers:  IncredibleEmployment.be  This test is no t yet approved or cleared by the [place] FDA and  has been authorized for detection and/or diagnosis of SARS-CoV-2 by FDA under an Emergency Use Authorization (EUA). This EUA will remain  in effect (meaning this test can be used) for the duration of the COVID-19 declaration under Section 564(b)(1) of the Act, 21 U.S.C.section 360bbb-3(b)(1), unless the authorization is terminated  or revoked sooner.       Influenza A by PCR NEGATIVE NEGATIVE Final   Influenza B by PCR NEGATIVE NEGATIVE Final    Comment: (NOTE) The Xpert Xpress SARS-CoV-2/FLU/RSV plus assay is intended as an aid in the diagnosis of influenza from Nasopharyngeal swab specimens and should not be used as a sole basis for treatment. Nasal washings and aspirates are unacceptable for Xpert Xpress SARS-CoV-2/FLU/RSV testing.  Fact Sheet for Patients: EntrepreneurPulse.com.au  Fact Sheet for Healthcare Providers: IncredibleEmployment.be  This test is not yet approved or cleared by the [place] FDA and has been authorized for detection and/or diagnosis of SARS-CoV-2 by FDA under an Emergency Use Authorization (EUA). This EUA will  remain in effect (meaning this test can be used) for the duration of the COVID-19 declaration under Section 564(b)(1) of the Act, 21 U.S.C. section 360bbb-3(b)(1), unless the authorization is terminated or revoked.  Performed at [facility], [place] [address], [place], [place] [address]     Labs: CBC: Recent Labs  Lab 09/09/21 1140 09/12/21 0740 09/13/21 0416  WBC 11.7* 12.2* 11.7*  NEUTROABS 9.5* 8.2*  --   HGB 14.6 14.7 13.5  HCT 42.5 42.6 40.0  MCV 83.8 83.0 85.3  PLT 329 402* 474   Basic Metabolic Panel: Recent Labs  Lab 09/09/21 1140 09/12/21 0740 09/13/21 0416 09/14/21 0408  NA 137 138 137  --   K 3.9 3.2* 3.0* 4.2  CL 103 102 105  --   CO2 25 23 25   --   GLUCOSE 244* 166* 100*  --   BUN 9 11 9   --   CREATININE 0.62 0.62 0.64  --   CALCIUM 9.0 9.4 8.3*  --   MG 1.9 1.9  --   --    Liver Function Tests: Recent Labs  Lab 09/09/21 1140 09/12/21 0740 09/13/21 0416  AST 17 20 16   ALT 17 20 17   ALKPHOS 95 95 74  BILITOT 0.5 0.9 1.0  PROT 7.3 8.0 6.5  ALBUMIN 4.2 4.6 3.7   CBG: Recent Labs  Lab 09/13/21 1119 09/13/21 1640 09/13/21 2208 09/14/21 0732 09/14/21 1203  GLUCAP  210* 127* 273* 182* 167*    Discharge time spent: 35 minutes.  Signed: Cordelia Poche, MD [facility] 09/14/2021

## 2021-09-14 NOTE — Progress Notes (Signed)
°  Transition of Care [facility]) Screening Note   Patient Details  Name: [name] Date of Birth: [birth year]   Transition of Care [facility]) CM/SW Contact:    Dessa Phi, RN Phone Number: 09/14/2021, 10:51 AM    Transition of Care Department [facility]) has reviewed patient and no TOC needs have been identified at this time. We will continue to monitor patient advancement through interdisciplinary progression rounds. If new patient transition needs arise, please place a TOC consult.

## 2021-09-14 NOTE — Progress Notes (Signed)
°   09/14/21 1137  Therapy Vitals  Pulse Rate 68  BP (!) 168/82  Mobility  Activity Ambulated with assistance in hallway  Level of Assistance Contact guard assist, steadying assist  Assistive Device Front wheel walker  Distance Ambulated (ft) 200 ft  Activity Response Tolerated well  $Mobility charge 1 Mobility   Pt agreeable to mobilize this morning. Ambulated about 243ft in hall with RW, tolerated well. She noted 6-8/10 pain while ambulated to her abdomen. There was a moment while ambulating where the pt stated her legs felt like "jell-o". Had pt take standing rest break and practice pursed lip breathing. Once pt felt steady again, session proceeded. Left pt in bed, call bell at side. RN/NT notified of session.   [place] Specialist Acute Rehab Services Office: [phone]

## 2021-09-14 NOTE — Assessment & Plan Note (Signed)
Uncertain etiology. Recurrent issue. Pain managed with bowel rest and analgesics successfully. Diet advanced successfully. Januvia discontinued on discharge. Patient has GI follow-up at [facility].

## 2021-09-20 ENCOUNTER — Telehealth: Payer: Self-pay | Admitting: Physical Medicine and Rehabilitation

## 2021-09-20 NOTE — Telephone Encounter (Signed)
Pt called. She would like for her procedure on 10/05/2021 NCS to be explained to her. She said if she does not answer LVM.   CB [phone]

## 2021-09-21 ENCOUNTER — Other Ambulatory Visit: Payer: Self-pay

## 2021-09-21 ENCOUNTER — Ambulatory Visit: Payer: Medicare Other | Attending: Specialist

## 2021-09-21 DIAGNOSIS — M4316 Spondylolisthesis, lumbar region: Secondary | ICD-10-CM | POA: Diagnosis not present

## 2021-09-21 DIAGNOSIS — M545 Low back pain, unspecified: Secondary | ICD-10-CM | POA: Diagnosis present

## 2021-09-21 DIAGNOSIS — G5602 Carpal tunnel syndrome, left upper limb: Secondary | ICD-10-CM | POA: Diagnosis present

## 2021-09-21 DIAGNOSIS — M542 Cervicalgia: Secondary | ICD-10-CM

## 2021-09-21 DIAGNOSIS — M4807 Spinal stenosis, lumbosacral region: Secondary | ICD-10-CM | POA: Diagnosis not present

## 2021-09-21 DIAGNOSIS — G8929 Other chronic pain: Secondary | ICD-10-CM | POA: Diagnosis present

## 2021-09-21 DIAGNOSIS — M5030 Other cervical disc degeneration, unspecified cervical region: Secondary | ICD-10-CM | POA: Insufficient documentation

## 2021-09-21 NOTE — Therapy (Signed)
OUTPATIENT PHYSICAL THERAPY THORACOLUMBAR EVALUATION   Patient Name: [name] MRN: [id number] DOB:13-Aug-1965, 56 y.o., female Today's Date: 09/23/2021   PT End of Session - 09/23/21 0909     Visit Number 1    Number of Visits 8    Date for PT Re-Evaluation 11/23/21    Authorization Type [employer] MCR    Authorization Time Period 09/21/21-11/23/21    Progress Note Due on Visit 8    PT Start Time 1300    PT Stop Time 1345    PT Time Calculation (min) 45 min    Activity Tolerance Patient tolerated treatment well    Behavior During Therapy WFL for tasks assessed/performed             Past Medical History:  Diagnosis Date   Anxiety    Arthritis    rheumatoid , osteoarthritis   Bronchitis    Chronic back pain    Depression    Diabetes mellitus    Fatty liver    Fibromyalgia    GERD (gastroesophageal reflux disease)    Headache    Hypertension    Pancreatitis    Pneumonia    Rotator cuff tear, right    Past Surgical History:  Procedure Laterality Date   ABDOMINAL HYSTERECTOMY     ANTERIOR CERVICAL DECOMP/DISCECTOMY FUSION N/A 07/21/2017   Procedure: ANTERIOR CERVICAL DISCECTOMY AND FUSION C6-7, REMOVAL OF SCREW C6 PLATE, DEPUY ZERO-P IMPLANT, LOCAL BONE GRAFT, ALLOGRAFT BONE GRAFT, VIVIGEN;  Surgeon: Jessy Oto, MD;  Location: [place];  Service: Orthopedics;  Laterality: N/A;   BACK SURGERY     laminectomy   BONE GRAFT HIP ILIAC CREST     BREAST BIOPSY     CARPAL TUNNEL RELEASE     CERVICAL DISCECTOMY  07/21/2017   CESAREAN SECTION     CHOLECYSTECTOMY     COLONOSCOPY     frontal fusion     neck fusion     patient reported   SHOULDER ARTHROSCOPY WITH SUBACROMIAL DECOMPRESSION, ROTATOR CUFF REPAIR AND BICEP TENDON REPAIR Right 10/31/2017   Procedure: RIGHT SHOULDER ARTHROSCOPY, MANIPULATION UNDER ANESTHESIA, BICEPS TENODESIS, AND MINI OPEN ROTATOR CUFF TEAR REPAIR;  Surgeon: Meredith Pel, MD;  Location: [place];  Service: Orthopedics;  Laterality:  Right;   Patient Active Problem List   Diagnosis Date Noted   Adrenal adenoma, left 09/13/2021   Intractable nausea and vomiting 09/12/2021   Anxiety 09/12/2021   Gastroenteritis 09/09/2021   Diabetes mellitus type 2 in obese ([place]) 09/09/2021   Class 2 obesity due to excess calories with body mass index (BMI) of 36.0 to 36.9 in adult 09/09/2021   Marijuana abuse 09/09/2021   Tobacco dependence 09/09/2021   Essential hypertension 05/29/2020   Abdominal pain 11/23/2019   Acute pancreatitis 11/21/2019   De Quervain's syndrome (tenosynovitis) 07/06/2018   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   Complete tear of right rotator cuff 07/22/2017    Class: Chronic   Retained orthopedic hardware 07/22/2017    Class: Chronic   Cervical disc herniation 07/21/2017    Class: Acute   Status post cervical spinal fusion 07/21/17 07/21/2017   Chronic back pain     PCP: Nolene Ebbs, MD  REFERRING PROVIDER: Jessy Oto, MD  REFERRING DIAG: [phone] (ICD-10-CM) - Spinal stenosis of lumbosacral region M43.16 (ICD-10-CM) - Spondylolisthesis of lumbar region M50.30 (ICD-10-CM) - Degenerative disc disease, cervical   THERAPY DIAG:  Cervicalgia  Chronic low back pain, unspecified back pain laterality, unspecified whether sciatica present  Carpal  tunnel syndrome of left wrist  ONSET DATE: chronic since 2018  SUBJECTIVE:                                                                                                                                                                                           SUBJECTIVE STATEMENT: Describes a history cervical and lumbar fusions as well as R RCR in 10/2017 PERTINENT HISTORY:  56 year old right handed female with history of C5-6 and C6-7 ACDF, last surgery 2018 zero implant at the C6-7 level. She has been experiencing pain into the neck and numbness and tingling into the left hand and on the right side it goes down into the right elbow. No bowel or bladder  difficulty. She is able to walk okay at times but she has more pain with sitting and has to adjust when she gets up. There in both the neck and low back. There is a pain like a rubberband at the neck. The lower back is a constant pain and throbbing. Pain with sitting and bending and stooping. In the past has had anterior lumbar fusion done for area that had previous surgery By Dr. Laverta Baltimore years ago. Has weakness in her arms and legs. She is on disability, since this happened, older she gets the worse she feels and feel like her situation is getting worse. History of previous right CTR and has seen a neurologist, Dr. Mariea Stable earlier this year and he ordered EMG/NCV. She did not have these done and has not rescheduled do to avoiding needle poke.   PAIN:  Are you having pain? Yes NPRS scale: 8/10 Pain location: neck, back and R shoulder pain Pain orientation: Right and Bilateral  PAIN TYPE: aching, burning, and tight Pain description: constant  Aggravating factors: prolonged positions Relieving factors: position changes  PRECAUTIONS: Cervical, Back, and Shoulder cervical and lumbar fusions, R RCR repair 2019  WEIGHT BEARING RESTRICTIONS No  FALLS:  Has patient fallen in last 6 months? Yes, Number of falls: 2  LIVING ENVIRONMENT: Lives with: lives alone Lives in: House/apartment Stairs: Yes; External: 8 steps; can reach both Has following equipment at home: Single point cane  OCCUPATION: disabled  PLOF: Independent  PATIENT GOALS : To be able to manage my symptoms and become more comfortable at night   OBJECTIVE:   DIAGNOSTIC FINDINGS:  XR Cervical Spine 2 or 3 views   Result Date: 09/03/2021 AP and lateral flexion and extension radiographs show previous ACDF C5-6 with plate and screws and C6-7 with Zero P implant no motion noted across these segments. There is minimal narrowing of the disc at C4-5 with anterior syndesmosises  at the remaining 3 levels above the fusion site. No acute  findings.    XR Lumbar Spine 2-3 Views   Result Date: 09/03/2021 AP and lateral flexion and extension radiographs show ray anterior cage fusions at the L4-5 and L5-S1 levels With small clips from previous anterior approach to fusion. With flexion and extension there is no motion across these Levels with anterior sentinel sign ov bone bridging. The L3-4 level has a spondylolisthesis that is grade 1 and increases from 3-4 mm to nearly 6-7 mm. There is disc height narrowing at this level and endplate sclerosis.    PATIENT SURVEYS:  FOTO 0  SCREENING FOR RED FLAGS: Bowel or bladder incontinence: No  COGNITION:  Overall cognitive status: Within functional limits for tasks assessed     SENSATION:  Light touch: Appears intact    MUSCLE LENGTH: Hamstrings: Right 70 deg; Left 45 deg Thomas test: UTA  POSTURE:  Rounded shoulders   PALPATION: TTP along paraspinals  SENSATION: Light touch: Appears intact    CERVICAL AROM/PROM  A/PROM A/PROM (deg) 09/23/2021  Flexion 20  Extension 35  Right lateral flexion   Left lateral flexion   Right rotation 40  Left rotation 50   (Blank rows = not tested)  UE AROM/PROM:  A/PROM Right 09/23/2021 Left 09/23/2021  Shoulder flexion [facility]  Shoulder extension    Shoulder abduction [facility]  Shoulder adduction    Shoulder extension    Shoulder internal rotation    Shoulder external rotation [facility]  Elbow flexion [facility] WFL  Elbow extension [facility] WFL  Wrist flexion [facility] WFL  Wrist extension [facility] WFL  Wrist ulnar deviation    Wrist radial deviation    Wrist pronation    Wrist supination     (Blank rows = not tested)  UE MMT:  MMT Right 09/23/2021 Left 09/23/2021  Shoulder flexion 5 5  Shoulder extension    Shoulder abduction 5 5  Shoulder adduction    Shoulder extension    Middle trapezius    Lower trapezius    Elbow flexion 5 5  Elbow extension 5 5  Wrist flexion 5 5  Wrist extension 5 5  Wrist ulnar deviation    Wrist  radial deviation    Wrist pronation    Wrist supination    Grip strength     (Blank rows = not tested)  CERVICAL SPECIAL TESTS:  NT due to fusion  FUNCTIONAL TESTS:  5 times sit to stand: tbd   LUMBARAROM/PROM  A/PROM A/PROM  09/23/2021  Flexion 10%  Extension 10%  Right lateral flexion 25%  Left lateral flexion 25%  Right rotation NT  Left rotation NT   (Blank rows = not tested)  LE AROM/PROM:  A/PROM Right 09/23/2021 Left 09/23/2021  Hip flexion 100 100  Hip extension 0 0  Hip abduction    Hip adduction    Hip internal rotation    Hip external rotation    Knee flexion [facility]  Knee extension [facility]  Ankle dorsiflexion    Ankle plantarflexion    Ankle inversion    Ankle eversion     (Blank rows = not tested)  LE MMT:  MMT Right 09/23/2021 Left 09/23/2021  Hip flexion    Hip extension 4 4  Hip abduction    Hip adduction    Hip internal rotation    Hip external rotation    Knee flexion    Knee extension 4 4  Ankle dorsiflexion  Ankle plantarflexion    Ankle inversion    Ankle eversion     (Blank rows = not tested)  LUMBAR SPECIAL TESTS:  Not tested due to multi-level fusion    GAIT: Distance walked: 150 Assistive device utilized: None Level of assistance: Modified independence Comments: mild guarding with limited trunk rotation    TODAY'S TREATMENT  Evaluation   PATIENT EDUCATION:  Education details: Discussed eval findings, rehab rationale and POC and patient is in agreement  Person educated: Patient Education method: Consulting civil engineer, Demonstration, and Handouts Education comprehension: verbalized understanding, returned demonstration, and needs further education   HOME EXERCISE PROGRAM: See medbridge notes  ASSESSMENT:  CLINICAL IMPRESSION: Patient is a 56 y.o. female who was seen today for physical therapy evaluation and treatment for chronic cervical and lumbar pain.  Patient has a history of multi-level cervical and lumbar  fusions dating back to 1999.  AROM restricted bu soft tissue tightness as well as expected intersegmental mobility.  No radicular symptoms identified.  Patient has realistic expectations regarding her rehab potential.  Objective impairments include Abnormal gait, decreased activity tolerance, decreased mobility, decreased ROM, decreased strength, and pain. These impairments are limiting patient from cleaning, community activity, driving, and occupation. Personal factors including Fitness, Past/current experiences, and Time since onset of injury/illness/exacerbation are also affecting patient's functional outcome. Patient will benefit from skilled PT to address above impairments and improve overall function.  REHAB POTENTIAL: Fair based on chronic nature of symptoms and multilevel fusions in cervical and lumbar spines  CLINICAL DECISION MAKING: Evolving/moderate complexity  EVALUATION COMPLEXITY: Low   GOALS: Goals reviewed with patient? Yes  SHORT TERM GOALS:  STG Name Target Date Goal status  1 Patient to demonstrate independence in HEP  Baseline: See Medbridge notes 10/07/2021 INITIAL  2 Initiate aquatic therapy Baseline: TBD 10/07/2021 INITIAL  LONG TERM GOALS:   LTG Name Target Date Goal status  1 50d R cervical rotation Baseline: 40d 11/18/2021 INITIAL  2 Increase B hip/knee extension to 4+/5 Baseline: 4/5 B 11/18/2021 INITIAL  3 Increase lumbar AROM extension to 25% Baseline: AROM lumbar 10% 11/18/2021 INITIAL  PLAN: PT FREQUENCY: 1x/week  PT DURATION: 8 weeks  PLANNED INTERVENTIONS: Therapeutic exercises, Therapeutic activity, Neuro Muscular re-education, Balance training, Gait training, Patient/Family education, Joint mobilization, Stair training, and Aquatic Therapy  PLAN FOR NEXT SESSION: HEP, trunk and core strengthening, McKenzie extension(per MD), hip/knee extensor strengthening, aquatic therapy   Lanice Shirts, PT 09/23/2021, 9:11 AM

## 2021-10-02 ENCOUNTER — Ambulatory Visit: Payer: Medicare Other

## 2021-10-02 ENCOUNTER — Telehealth: Payer: Self-pay

## 2021-10-02 NOTE — Therapy (Unsigned)
OUTPATIENT PHYSICAL THERAPY TREATMENT NOTE   Patient Name: [name] MRN: [id number] DOB:06-Dec-1965, 56 y.o., female Today's Date: 10/02/2021  PCP: Nolene Ebbs, MD REFERRING PROVIDER: Nolene Ebbs, MD    Past Medical History:  Diagnosis Date   Anxiety    Arthritis    rheumatoid , osteoarthritis   Bronchitis    Chronic back pain    Depression    Diabetes mellitus    Fatty liver    Fibromyalgia    GERD (gastroesophageal reflux disease)    Headache    Hypertension    Pancreatitis    Pneumonia    Rotator cuff tear, right    Past Surgical History:  Procedure Laterality Date   ABDOMINAL HYSTERECTOMY     ANTERIOR CERVICAL DECOMP/DISCECTOMY FUSION N/A 07/21/2017   Procedure: ANTERIOR CERVICAL DISCECTOMY AND FUSION C6-7, REMOVAL OF SCREW C6 PLATE, DEPUY ZERO-P IMPLANT, LOCAL BONE GRAFT, ALLOGRAFT BONE GRAFT, VIVIGEN;  Surgeon: Jessy Oto, MD;  Location: [place];  Service: Orthopedics;  Laterality: N/A;   BACK SURGERY     laminectomy   BONE GRAFT HIP ILIAC CREST     BREAST BIOPSY     CARPAL TUNNEL RELEASE     CERVICAL DISCECTOMY  07/21/2017   CESAREAN SECTION     CHOLECYSTECTOMY     COLONOSCOPY     frontal fusion     neck fusion     patient reported   SHOULDER ARTHROSCOPY WITH SUBACROMIAL DECOMPRESSION, ROTATOR CUFF REPAIR AND BICEP TENDON REPAIR Right 10/31/2017   Procedure: RIGHT SHOULDER ARTHROSCOPY, MANIPULATION UNDER ANESTHESIA, BICEPS TENODESIS, AND MINI OPEN ROTATOR CUFF TEAR REPAIR;  Surgeon: Meredith Pel, MD;  Location: [place];  Service: Orthopedics;  Laterality: Right;   Patient Active Problem List   Diagnosis Date Noted   Adrenal adenoma, left 09/13/2021   Intractable nausea and vomiting 09/12/2021   Anxiety 09/12/2021   Gastroenteritis 09/09/2021   Diabetes mellitus type 2 in obese ([place]) 09/09/2021   Class 2 obesity due to excess calories with body mass index (BMI) of 36.0 to 36.9 in adult 09/09/2021   Marijuana abuse 09/09/2021    Tobacco dependence 09/09/2021   Essential hypertension 05/29/2020   Abdominal pain 11/23/2019   Acute pancreatitis 11/21/2019   De Quervain's syndrome (tenosynovitis) 07/06/2018   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   Complete tear of right rotator cuff 07/22/2017    Class: Chronic   Retained orthopedic hardware 07/22/2017    Class: Chronic   Cervical disc herniation 07/21/2017    Class: Acute   Status post cervical spinal fusion 07/21/17 07/21/2017   Chronic back pain     REFERRING DIAG: ***  THERAPY DIAG:  No diagnosis found.  PERTINENT HISTORY: ***  PRECAUTIONS: ***  SUBJECTIVE: ***  PAIN:  Are you having pain? {yes/no:20286} NPRS scale: ***/10 Pain location: *** Pain orientation: {Pain Orientation:25161}  PAIN TYPE: {type:313116} Pain description: {PAIN DESCRIPTION:21022940}  Aggravating factors: *** Relieving factors: ***     OBJECTIVE:    DIAGNOSTIC FINDINGS:  XR Cervical Spine 2 or 3 views   Result Date: 09/03/2021 AP and lateral flexion and extension radiographs show previous ACDF C5-6 with plate and screws and C6-7 with Zero P implant no motion noted across these segments. There is minimal narrowing of the disc at C4-5 with anterior syndesmosises at the remaining 3 levels above the fusion site. No acute findings.    XR Lumbar Spine 2-3 Views   Result Date: 09/03/2021 AP and lateral flexion and extension radiographs show ray anterior cage fusions  at the L4-5 and L5-S1 levels With small clips from previous anterior approach to fusion. With flexion and extension there is no motion across these Levels with anterior sentinel sign ov bone bridging. The L3-4 level has a spondylolisthesis that is grade 1 and increases from 3-4 mm to nearly 6-7 mm. There is disc height narrowing at this level and endplate sclerosis.     PATIENT SURVEYS:  FOTO 0   SCREENING FOR RED FLAGS: Bowel or bladder incontinence: No   COGNITION:          Overall cognitive status: Within  functional limits for tasks assessed                        SENSATION:          Light touch: Appears intact             MUSCLE LENGTH: Hamstrings: Right 70 deg; Left 45 deg Thomas test: UTA   POSTURE:  Rounded shoulders    PALPATION: TTP along paraspinals   SENSATION: Light touch: Appears intact       CERVICAL AROM/PROM   A/PROM A/PROM (deg) 09/23/2021  Flexion 20  Extension 35  Right lateral flexion    Left lateral flexion    Right rotation 40  Left rotation 50   (Blank rows = not tested)   UE AROM/PROM:   A/PROM Right 09/23/2021 Left 09/23/2021  Shoulder flexion [facility]  Shoulder extension      Shoulder abduction [facility]  Shoulder adduction      Shoulder extension      Shoulder internal rotation      Shoulder external rotation [facility]  Elbow flexion [facility] WFL  Elbow extension [facility] WFL  Wrist flexion [facility] WFL  Wrist extension [facility] WFL  Wrist ulnar deviation      Wrist radial deviation      Wrist pronation      Wrist supination       (Blank rows = not tested)   UE MMT:   MMT Right 09/23/2021 Left 09/23/2021  Shoulder flexion 5 5  Shoulder extension      Shoulder abduction 5 5  Shoulder adduction      Shoulder extension      Middle trapezius      Lower trapezius      Elbow flexion 5 5  Elbow extension 5 5  Wrist flexion 5 5  Wrist extension 5 5  Wrist ulnar deviation      Wrist radial deviation      Wrist pronation      Wrist supination      Grip strength       (Blank rows = not tested)   CERVICAL SPECIAL TESTS:  NT due to fusion   FUNCTIONAL TESTS:  5 times sit to stand: tbd   LUMBARAROM/PROM   A/PROM A/PROM  09/23/2021  Flexion 10%  Extension 10%  Right lateral flexion 25%  Left lateral flexion 25%  Right rotation NT  Left rotation NT   (Blank rows = not tested)   LE AROM/PROM:   A/PROM Right 09/23/2021 Left 09/23/2021  Hip flexion 100 100  Hip extension 0 0  Hip abduction      Hip adduction      Hip internal rotation       Hip external rotation      Knee flexion [facility]  Knee extension [facility]  Ankle dorsiflexion      Ankle plantarflexion  Ankle inversion      Ankle eversion       (Blank rows = not tested)   LE MMT:   MMT Right 09/23/2021 Left 09/23/2021  Hip flexion      Hip extension 4 4  Hip abduction      Hip adduction      Hip internal rotation      Hip external rotation      Knee flexion      Knee extension 4 4  Ankle dorsiflexion      Ankle plantarflexion      Ankle inversion      Ankle eversion       (Blank rows = not tested)   LUMBAR SPECIAL TESTS:  Not tested due to multi-level fusion       GAIT: Distance walked: 150 Assistive device utilized: None Level of assistance: Modified independence Comments: mild guarding with limited trunk rotation       TODAY'S TREATMENT  Evaluation     PATIENT EDUCATION:  Education details: Discussed eval findings, rehab rationale and POC and patient is in agreement  Person educated: Patient Education method: Consulting civil engineer, Demonstration, and Handouts Education comprehension: verbalized understanding, returned demonstration, and needs further education     HOME EXERCISE PROGRAM: See medbridge notes   ASSESSMENT:   CLINICAL IMPRESSION: Patient is a 56 y.o. female who was seen today for physical therapy evaluation and treatment for chronic cervical and lumbar pain.  Patient has a history of multi-level cervical and lumbar fusions dating back to 1999.  AROM restricted bu soft tissue tightness as well as expected intersegmental mobility.  No radicular symptoms identified.  Patient has realistic expectations regarding her rehab potential.  Objective impairments include Abnormal gait, decreased activity tolerance, decreased mobility, decreased ROM, decreased strength, and pain. These impairments are limiting patient from cleaning, community activity, driving, and occupation. Personal factors including Fitness, Past/current experiences, and  Time since onset of injury/illness/exacerbation are also affecting patient's functional outcome. Patient will benefit from skilled PT to address above impairments and improve overall function.   REHAB POTENTIAL: Fair based on chronic nature of symptoms and multilevel fusions in cervical and lumbar spines   CLINICAL DECISION MAKING: Evolving/moderate complexity   EVALUATION COMPLEXITY: Low     GOALS: Goals reviewed with patient? Yes   SHORT TERM GOALS:   STG Name Target Date Goal status  1 Patient to demonstrate independence in HEP  Baseline: See Med[employer] notes 10/07/2021 INITIAL  2 Initiate aquatic therapy Baseline: TBD 10/07/2021 INITIAL  LONG TERM GOALS:    LTG Name Target Date Goal status  1 50d R cervical rotation Baseline: 40d 11/18/2021 INITIAL  2 Increase B hip/knee extension to 4+/5 Baseline: 4/5 B 11/18/2021 INITIAL  3 Increase lumbar AROM extension to 25% Baseline: AROM lumbar 10% 11/18/2021 INITIAL  PLAN: PT FREQUENCY: 1x/week   PT DURATION: 8 weeks   PLANNED INTERVENTIONS: Therapeutic exercises, Therapeutic activity, Neuro Muscular re-education, Balance training, Gait training, Patient/Family education, Joint mobilization, Stair training, and Aquatic Therapy   PLAN FOR NEXT SESSION: HEP, trunk and core strengthening, McKenzie extension(per MD), hip/knee extensor strengthening, aquatic therapy    Lanice Shirts, PT 10/02/2021, 1:13 PM

## 2021-10-02 NOTE — Telephone Encounter (Signed)
VM left regarding missed visit and reminder of next appointment

## 2021-10-05 ENCOUNTER — Encounter: Payer: Medicare Other | Admitting: Physical Medicine and Rehabilitation

## 2021-10-09 ENCOUNTER — Ambulatory Visit: Payer: Medicare Other

## 2021-10-09 ENCOUNTER — Other Ambulatory Visit: Payer: Self-pay

## 2021-10-09 DIAGNOSIS — M545 Low back pain, unspecified: Secondary | ICD-10-CM

## 2021-10-09 DIAGNOSIS — M542 Cervicalgia: Secondary | ICD-10-CM | POA: Diagnosis not present

## 2021-10-09 NOTE — Therapy (Signed)
OUTPATIENT PHYSICAL THERAPY TREATMENT NOTE   Patient Name: [name] MRN: [id number] DOB:08-22-1965, 56 y.o., female Today's Date: 10/09/2021  PCP: Nolene Ebbs, MD REFERRING PROVIDER: Jessy Oto, MD   PT End of Session - 10/09/21 1300     Visit Number 2    Number of Visits 8    Date for PT Re-Evaluation 11/23/21    Authorization Type [employer] MCR    Authorization Time Period 09/21/21-11/23/21    Progress Note Due on Visit 8    PT Start Time 1300    PT Stop Time 1340    PT Time Calculation (min) 40 min    Activity Tolerance Patient limited by pain    Behavior During Therapy WFL for tasks assessed/performed             Past Medical History:  Diagnosis Date   Anxiety    Arthritis    rheumatoid , osteoarthritis   Bronchitis    Chronic back pain    Depression    Diabetes mellitus    Fatty liver    Fibromyalgia    GERD (gastroesophageal reflux disease)    Headache    Hypertension    Pancreatitis    Pneumonia    Rotator cuff tear, right    Past Surgical History:  Procedure Laterality Date   ABDOMINAL HYSTERECTOMY     ANTERIOR CERVICAL DECOMP/DISCECTOMY FUSION N/A 07/21/2017   Procedure: ANTERIOR CERVICAL DISCECTOMY AND FUSION C6-7, REMOVAL OF SCREW C6 PLATE, DEPUY ZERO-P IMPLANT, LOCAL BONE GRAFT, ALLOGRAFT BONE GRAFT, VIVIGEN;  Surgeon: Jessy Oto, MD;  Location: [place];  Service: Orthopedics;  Laterality: N/A;   BACK SURGERY     laminectomy   BONE GRAFT HIP ILIAC CREST     BREAST BIOPSY     CARPAL TUNNEL RELEASE     CERVICAL DISCECTOMY  07/21/2017   CESAREAN SECTION     CHOLECYSTECTOMY     COLONOSCOPY     frontal fusion     neck fusion     patient reported   SHOULDER ARTHROSCOPY WITH SUBACROMIAL DECOMPRESSION, ROTATOR CUFF REPAIR AND BICEP TENDON REPAIR Right 10/31/2017   Procedure: RIGHT SHOULDER ARTHROSCOPY, MANIPULATION UNDER ANESTHESIA, BICEPS TENODESIS, AND MINI OPEN ROTATOR CUFF TEAR REPAIR;  Surgeon: Meredith Pel, MD;  Location:  [place];  Service: Orthopedics;  Laterality: Right;   Patient Active Problem List   Diagnosis Date Noted   Adrenal adenoma, left 09/13/2021   Intractable nausea and vomiting 09/12/2021   Anxiety 09/12/2021   Gastroenteritis 09/09/2021   Diabetes mellitus type 2 in obese ([place]) 09/09/2021   Class 2 obesity due to excess calories with body mass index (BMI) of 36.0 to 36.9 in adult 09/09/2021   Marijuana abuse 09/09/2021   Tobacco dependence 09/09/2021   Essential hypertension 05/29/2020   Abdominal pain 11/23/2019   Acute pancreatitis 11/21/2019   De Quervain's syndrome (tenosynovitis) 07/06/2018   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   Complete tear of right rotator cuff 07/22/2017    Class: Chronic   Retained orthopedic hardware 07/22/2017    Class: Chronic   Cervical disc herniation 07/21/2017    Class: Acute   Status post cervical spinal fusion 07/21/17 07/21/2017   Chronic back pain     REFERRING DIAG: M48.07 (ICD-10-CM) - Spinal stenosis of lumbosacral region M43.16 (ICD-10-CM) - Spondylolisthesis of lumbar region M50.30 (ICD-10-CM) - Degenerative disc disease, cervical   THERAPY DIAG:  Cervicalgia  Chronic low back pain, unspecified back pain laterality, unspecified whether sciatica present  PERTINENT HISTORY:  56 year old right handed female with history of C5-6 and C6-7 ACDF, last surgery 2018 zero implant at the C6-7 level. She has been experiencing pain into the neck and numbness and tingling into the left hand and on the right side it goes down into the right elbow. No bowel or bladder difficulty. She is able to walk okay at times but she has more pain with sitting and has to adjust when she gets up. There in both the neck and low back. There is a pain like a rubberband at the neck. The lower back is a constant pain and throbbing. Pain with sitting and bending and stooping. In the past has had anterior lumbar fusion done for area that had previous surgery by Dr. Laverta Baltimore years ago. Has  weakness in her arms and legs.She is on disability, since this happened, older she gets the worse she feels and feel like her situation is getting worse. History of previous right CTR and has seen a neurologist, Dr. Mariea Stable earlier this year and he ordered EMG/NCV.She did not have these done and has not rescheduled do to avoiding needle poke.   PRECAUTIONS: Cervical, Back, and Shoulder cervical and lumbar fusions, R RCR repair 2019  ONSET DATE: chronic since 2018  SUBJECTIVE: I'm hurting everywhere today, in my shoulders, neck, arms, back, legs, and knees.  PAIN:  Are you having pain? Yes NPRS scale: 8/10 Pain location: neck, back and R shoulder pain Pain orientation: Right and Bilateral  PAIN TYPE: aching, burning, and tight Pain description: constant  Aggravating factors: prolonged positions Relieving factors: position changes     OBJECTIVE:    DIAGNOSTIC FINDINGS:  XR Cervical Spine 2 or 3 views   Result Date: 09/03/2021 AP and lateral flexion and extension radiographs show previous ACDF C5-6 with plate and screws and C6-7 with Zero P implant no motion noted across these segments. There is minimal narrowing of the disc at C4-5 with anterior syndesmosises at the remaining 3 levels above the fusion site. No acute findings.    XR Lumbar Spine 2-3 Views   Result Date: 09/03/2021 AP and lateral flexion and extension radiographs show ray anterior cage fusions at the L4-5 and L5-S1 levels With small clips from previous anterior approach to fusion. With flexion and extension there is no motion across these Levels with anterior sentinel sign ov bone bridging. The L3-4 level has a spondylolisthesis that is grade 1 and increases from 3-4 mm to nearly 6-7 mm. There is disc height narrowing at this level and endplate sclerosis.     PATIENT SURVEYS:  FOTO 0   SCREENING FOR RED FLAGS: Bowel or bladder incontinence: No   COGNITION:          Overall cognitive status: Within functional limits  for tasks assessed                        SENSATION:          Light touch: Appears intact             MUSCLE LENGTH: Hamstrings: Right 70 deg; Left 45 deg Thomas test: UTA   POSTURE:  Rounded shoulders    PALPATION: TTP along paraspinals   SENSATION: Light touch: Appears intact       CERVICAL AROM/PROM   A/PROM A/PROM (deg) 09/23/2021  Flexion 20  Extension 35  Right lateral flexion    Left lateral flexion    Right rotation 40  Left rotation 50   (  Blank rows = not tested)   UE AROM/PROM:   A/PROM Right 09/23/2021 Left 09/23/2021  Shoulder flexion [facility]  Shoulder extension      Shoulder abduction [facility]  Shoulder adduction      Shoulder extension      Shoulder internal rotation      Shoulder external rotation [facility]  Elbow flexion [facility] WFL  Elbow extension [facility]  Wrist flexion [facility] WFL  Wrist extension [facility] WFL  Wrist ulnar deviation      Wrist radial deviation      Wrist pronation      Wrist supination       (Blank rows = not tested)   UE MMT:   MMT Right 09/23/2021 Left 09/23/2021  Shoulder flexion 5 5  Shoulder extension      Shoulder abduction 5 5  Shoulder adduction      Shoulder extension      Middle trapezius      Lower trapezius      Elbow flexion 5 5  Elbow extension 5 5  Wrist flexion 5 5  Wrist extension 5 5  Wrist ulnar deviation      Wrist radial deviation      Wrist pronation      Wrist supination      Grip strength       (Blank rows = not tested)   CERVICAL SPECIAL TESTS:  NT due to fusion   FUNCTIONAL TESTS:  5 times sit to stand: tbd   LUMBARAROM/PROM   A/PROM A/PROM  09/23/2021  Flexion 10%  Extension 10%  Right lateral flexion 25%  Left lateral flexion 25%  Right rotation NT  Left rotation NT   (Blank rows = not tested)   LE AROM/PROM:   A/PROM Right 09/23/2021 Left 09/23/2021  Hip flexion 100 100  Hip extension 0 0  Hip abduction      Hip adduction      Hip internal rotation      Hip external  rotation      Knee flexion [facility] WFL  Knee extension [facility]  Ankle dorsiflexion      Ankle plantarflexion      Ankle inversion      Ankle eversion       (Blank rows = not tested)   LE MMT:   MMT Right 09/23/2021 Left 09/23/2021  Hip flexion      Hip extension 4 4  Hip abduction      Hip adduction      Hip internal rotation      Hip external rotation      Knee flexion      Knee extension 4 4  Ankle dorsiflexion      Ankle plantarflexion      Ankle inversion      Ankle eversion       (Blank rows = not tested)   LUMBAR SPECIAL TESTS:  Not tested due to multi-level fusion       GAIT: Distance walked: 150 Assistive device utilized: None Level of assistance: Modified independence Comments: mild guarding with limited trunk rotation       TODAY'S TREATMENT  [facility] Adult PT Treatment:                                                DATE: 10/09/2021 Therapeutic Exercise: LAQ x  10 BIL Seated march x 10 BIL Supine clamshells RTB x 10 Supine marching  PPT x 10 Hip adduction ball squeeze 3" hold x 10 Seated hamstring curl GTB x 10 BIL Rows RTB 2 x 10 Horizontal abduction RTB x 10 Modalities: MHP to lower back in supine x 10 mins Self Care: Instruction in log rolling    09/23/2021 Evaluation     PATIENT EDUCATION:  Education details: Discussed eval findings, rehab rationale and POC and patient is in agreement  Person educated: Patient Education method: Explanation, Demonstration, and Handouts Education comprehension: verbalized understanding, returned demonstration, and needs further education     HOME EXERCISE PROGRAM: See medbridge notes   ASSESSMENT:   CLINICAL IMPRESSION: Patient presents to PT with high levels of full body pain and guarded movements. She displays facial grimacing and vocalizations throughout entire session and has significant difficulty moving from sit <-> supine. Was not able to perform and manual muscle testing this session due to high  levels of pain. Patient continues to benefit from skilled PT services and should be progressed as able to improve functional independence.    REHAB POTENTIAL: Fair based on chronic nature of symptoms and multilevel fusions in cervical and lumbar spines   CLINICAL DECISION MAKING: Evolving/moderate complexity   EVALUATION COMPLEXITY: Low     GOALS: Goals reviewed with patient? Yes   SHORT TERM GOALS:   STG Name Target Date Goal status  1 Patient to demonstrate independence in HEP  Baseline: See Medbridge notes 10/07/2021 INITIAL  2 Initiate aquatic therapy Baseline: TBD 10/07/2021 INITIAL  LONG TERM GOALS:    LTG Name Target Date Goal status  1 50d R cervical rotation Baseline: 40d 11/18/2021 INITIAL  2 Increase B hip/knee extension to 4+/5 Baseline: 4/5 B 11/18/2021 INITIAL  3 Increase lumbar AROM extension to 25% Baseline: AROM lumbar 10% 11/18/2021 INITIAL  PLAN: PT FREQUENCY: 1x/week   PT DURATION: 8 weeks   PLANNED INTERVENTIONS: Therapeutic exercises, Therapeutic activity, Neuro Muscular re-education, Balance training, Gait training, Patient/Family education, Joint mobilization, Stair training, and Aquatic Therapy   PLAN FOR NEXT SESSION: HEP, trunk and core strengthening, McKenzie extension(per MD), hip/knee extensor strengthening, aquatic therapy    Evelene Croon, PTA 10/09/2021, 1:36 PM

## 2021-10-15 ENCOUNTER — Emergency Department (HOSPITAL_COMMUNITY): Payer: Medicare Other

## 2021-10-15 ENCOUNTER — Emergency Department (HOSPITAL_COMMUNITY)
Admission: EM | Admit: 2021-10-15 | Discharge: 2021-10-15 | Disposition: A | Discharge status: Home / Self Care | Payer: Medicare Other | Source: Home / Self Care | Attending: Emergency Medicine | Admitting: Emergency Medicine

## 2021-10-15 ENCOUNTER — Encounter (HOSPITAL_COMMUNITY): Payer: Self-pay

## 2021-10-15 ENCOUNTER — Other Ambulatory Visit: Payer: Self-pay

## 2021-10-15 DIAGNOSIS — R112 Nausea with vomiting, unspecified: Secondary | ICD-10-CM | POA: Insufficient documentation

## 2021-10-15 DIAGNOSIS — I10 Essential (primary) hypertension: Secondary | ICD-10-CM | POA: Insufficient documentation

## 2021-10-15 DIAGNOSIS — Z794 Long term (current) use of insulin: Secondary | ICD-10-CM | POA: Insufficient documentation

## 2021-10-15 DIAGNOSIS — Z7984 Long term (current) use of oral hypoglycemic drugs: Secondary | ICD-10-CM | POA: Insufficient documentation

## 2021-10-15 DIAGNOSIS — E119 Type 2 diabetes mellitus without complications: Secondary | ICD-10-CM | POA: Insufficient documentation

## 2021-10-15 DIAGNOSIS — R197 Diarrhea, unspecified: Secondary | ICD-10-CM | POA: Insufficient documentation

## 2021-10-15 DIAGNOSIS — Z79899 Other long term (current) drug therapy: Secondary | ICD-10-CM | POA: Insufficient documentation

## 2021-10-15 DIAGNOSIS — Z7982 Long term (current) use of aspirin: Secondary | ICD-10-CM | POA: Insufficient documentation

## 2021-10-15 DIAGNOSIS — R1013 Epigastric pain: Secondary | ICD-10-CM | POA: Diagnosis not present

## 2021-10-15 DIAGNOSIS — R1084 Generalized abdominal pain: Secondary | ICD-10-CM | POA: Insufficient documentation

## 2021-10-15 LAB — CBC WITH DIFFERENTIAL/PLATELET
Abs Immature Granulocytes: 0.09 10*3/uL — ABNORMAL HIGH (ref 0.00–0.07)
Basophils Absolute: 0 10*3/uL (ref 0.0–0.1)
Basophils Relative: 0 %
Eosinophils Absolute: 0.1 10*3/uL (ref 0.0–0.5)
Eosinophils Relative: 1 %
HCT: 40.4 % (ref 36.0–46.0)
Hemoglobin: 13.5 g/dL (ref 12.0–15.0)
Immature Granulocytes: 1 %
Lymphocytes Relative: 29 %
Lymphs Abs: 3.3 10*3/uL (ref 0.7–4.0)
MCH: 27.9 pg (ref 26.0–34.0)
MCHC: 33.4 g/dL (ref 30.0–36.0)
MCV: 83.5 fL (ref 80.0–100.0)
Monocytes Absolute: 0.9 10*3/uL (ref 0.1–1.0)
Monocytes Relative: 8 %
Neutro Abs: 7 10*3/uL (ref 1.7–7.7)
Neutrophils Relative %: 61 %
Platelets: 361 10*3/uL (ref 150–400)
RBC: 4.84 MIL/uL (ref 3.87–5.11)
RDW: 13.5 % (ref 11.5–15.5)
WBC: 11.3 10*3/uL — ABNORMAL HIGH (ref 4.0–10.5)
nRBC: 0 % (ref 0.0–0.2)

## 2021-10-15 LAB — I-STAT BETA HCG BLOOD, ED (MC, WL, AP ONLY): I-stat hCG, quantitative: <5 m[IU]/mL (ref <5)

## 2021-10-15 LAB — COMPREHENSIVE METABOLIC PANEL
ALT: 19 U/L (ref 0–44)
AST: 36 U/L (ref 15–41)
Albumin: 4.2 g/dL (ref 3.5–5.0)
Alkaline Phosphatase: 102 U/L (ref 38–126)
Anion gap: 9 (ref 5–15)
BUN: 9 mg/dL (ref 6–20)
CO2: 24 mmol/L (ref 22–32)
Calcium: 8.8 mg/dL — ABNORMAL LOW (ref 8.9–10.3)
Chloride: 103 mmol/L (ref 98–111)
Creatinine, Ser: 0.56 mg/dL (ref 0.44–1.00)
GFR, Estimated: >60 mL/min (ref >60)
Glucose, Bld: 245 mg/dL — ABNORMAL HIGH (ref 70–99)
Potassium: 4.8 mmol/L (ref 3.5–5.1)
Sodium: 136 mmol/L (ref 135–145)
Total Bilirubin: 1.9 mg/dL — ABNORMAL HIGH (ref 0.3–1.2)
Total Protein: 7.6 g/dL (ref 6.5–8.1)

## 2021-10-15 LAB — ETHANOL: Alcohol, Ethyl (B): <10 mg/dL (ref <10)

## 2021-10-15 LAB — LIPASE, BLOOD: Lipase: 39 U/L (ref 11–51)

## 2021-10-15 MED ORDER — ONDANSETRON HCL 4 MG PO TABS: 4.0000 mg | ORAL_TABLET | Freq: Four times a day (QID) | ORAL | 0 refills | Status: DC

## 2021-10-15 MED ORDER — MORPHINE SULFATE (PF) 4 MG/ML IV SOLN
4.0000 mg | Freq: Once | INTRAVENOUS | Status: AC
  Administered 2021-10-15: 4 mg via INTRAVENOUS
  Filled 2021-10-15: qty 1

## 2021-10-15 MED ORDER — IOHEXOL 300 MG/ML  SOLN
100.0000 mL | Freq: Once | INTRAMUSCULAR | Status: AC | PRN
  Administered 2021-10-15: 100 mL via INTRAVENOUS

## 2021-10-15 MED ORDER — LORAZEPAM 2 MG/ML IJ SOLN
0.5000 mg | Freq: Once | INTRAMUSCULAR | Status: AC
  Administered 2021-10-15: 0.5 mg via INTRAVENOUS
  Filled 2021-10-15: qty 1

## 2021-10-15 MED ORDER — ONDANSETRON HCL 4 MG/2ML IJ SOLN
4.0000 mg | Freq: Once | INTRAMUSCULAR | Status: AC
  Administered 2021-10-15: 4 mg via INTRAVENOUS
  Filled 2021-10-15: qty 2

## 2021-10-15 NOTE — ED Triage Notes (Signed)
Patient BIB [facility] from home. Patient c/o abdomen pain beginning yesterday. Patient states has not taken anything for the pain. Has had N/V/D. Hx pancreatitis, diabetes, and hypertension.  ? ?Vital signs were:  ?180/110 ?109-Hr ?26-RR ?98% RA ?97.3 temp  ?292-CBG ?

## 2021-10-15 NOTE — ED Provider Notes (Signed)
[facility] DEPT Provider Note   CSN: [id number] Arrival date & time: 10/15/21  1033     History  No chief complaint on file.   [name] is a 56 y.o. female.  56 year old female with prior medical history as detailed below presents with complaint of abdominal pain.  Patient reports diffuse abdominal discomfort that began yesterday.  She is concerned that her pain may be reflective of acute pancreatitis.  She reports prior history of pancreatitis.  She also has prior history of diabetes and hypertension.  She reports some nausea and vomiting and diarrhea with her symptoms.  She denies fever.  She denies bloody emesis or blood in her stool.  The history is provided by the patient and medical records.  Illness Location:  Nausea, vomiting, diarrhea, abdominal pain Severity:  Moderate Onset quality:  Gradual Duration:  1 day Timing:  Constant Progression:  Waxing and waning Chronicity:  New     Home Medications Prior to Admission medications   Medication Sig Start Date End Date Taking? Authorizing Provider  ondansetron (ZOFRAN) 4 MG tablet Take 1 tablet (4 mg total) by mouth every 6 (six) hours. 10/15/21  Yes Valarie Merino, MD  acetaminophen (TYLENOL) 500 MG tablet Take 500 mg by mouth 2 (two) times daily as needed for moderate pain.    [provider]  albuterol (PROVENTIL HFA;VENTOLIN HFA) 108 (90 BASE) MCG/ACT inhaler Inhale 2 puffs into the lungs every 4 (four) hours as needed for wheezing.    [provider]  albuterol (PROVENTIL) (2.5 MG/3ML) 0.083% nebulizer solution Take 3 mLs (2.5 mg total) by nebulization every 6 (six) hours as needed for wheezing or shortness of breath. 12/12/19   Tasia Catchings, Amy V, PA-C  ALPRAZolam Duanne Moron) 1 MG tablet Take 1 mg by mouth daily as needed for anxiety. 10/23/19   [provider]  amLODipine-olmesartan (AZOR) 5-40 MG tablet Take 1 tablet by mouth daily. 06/05/21   [provider]  aspirin EC 81 MG tablet Take 81 mg by mouth daily. Swallow whole.    [provider]  celecoxib (CELEBREX) 200 MG capsule Take 1 capsule (200 mg total) by mouth 2 (two) times daily. 09/03/21   Jessy Oto, MD  cetirizine (ZYRTEC) 10 MG tablet Take 10 mg by mouth daily as needed for allergies. 09/27/20   [provider]  cyclobenzaprine (FLEXERIL) 10 MG tablet Take 1 tablet (10 mg total) by mouth 2 (two) times daily as needed for muscle spasms. 11/27/20   Jessy Oto, MD  dicyclomine (BENTYL) 20 MG tablet Take 1 tablet (20 mg total) by mouth 2 (two) times daily. 04/20/21   Henderly, Britni A, PA-C  escitalopram (LEXAPRO) 20 MG tablet Take 20 mg by mouth daily. 08/10/20   [provider]  fluticasone (FLONASE) 50 MCG/ACT nasal spray Place 1 spray into both nostrils daily as needed for allergies.    [provider]  HYDROcodone-acetaminophen (NORCO/VICODIN) 5-325 MG tablet Take 1 tablet by mouth every 6 (six) hours as needed for moderate pain. 09/14/21   Mariel Aloe, MD  insulin glargine (LANTUS) 100 unit/mL SOPN Inject 60 Units into the skin daily at 2 PM.    [provider]  loperamide (IMODIUM) 2 MG capsule Take 1 capsule (2 mg total) by mouth 4 (four) times daily as needed for diarrhea or loose stools. 09/09/21   Hayden Rasmussen, MD  metFORMIN (GLUCOPHAGE) 1000 MG tablet Take 1,000 mg by mouth 2 (two) times  daily. 03/26/21   [provider]  metoprolol succinate (TOPROL-XL) 50 MG 24 hr tablet Take 50 mg by mouth daily. 07/21/14   [provider]  Multiple Vitamins-Minerals (CENTRUM PO) Take 1 tablet by mouth daily.    [provider]  ondansetron (ZOFRAN ODT) 4 MG disintegrating tablet Take 1 tablet (4 mg total) by mouth every 8 (eight) hours as needed for nausea or vomiting. 09/09/21   Hayden Rasmussen, MD  pantoprazole (PROTONIX) 20 MG tablet Take 1 tablet (20 mg total) by mouth daily. 06/22/21 09/13/21   Loni Beckwith, PA-C  simvastatin (ZOCOR) 20 MG tablet Take 20 mg by mouth daily. 08/22/20   [provider]  promethazine (PHENERGAN) 25 MG tablet Take 1 tablet (25 mg total) by mouth every 6 (six) hours as needed for nausea or vomiting. Patient not taking: No sig reported 08/15/20 10/19/20  Wieters, Madelynn Done C, PA-C      Allergies    Patient has no known allergies.    Review of Systems   Review of Systems  All other systems reviewed and are negative.  Physical Exam Updated Vital Signs BP (!) 166/101    Pulse 79    Temp 98.5 F (36.9 C) (Oral)    Resp 16    Ht '5\' 4"'$  (1.626 m)    Wt 97 kg    SpO2 98%    BMI 36.71 kg/m  Physical Exam Vitals and nursing note reviewed.  Constitutional:      General: She is not in acute distress.    Appearance: She is well-developed.     Comments: Tearful, appears uncomfortable  HENT:     Head: Normocephalic and atraumatic.  Eyes:     Conjunctiva/sclera: Conjunctivae normal.     Pupils: Pupils are equal, round, and reactive to light.  Cardiovascular:     Rate and Rhythm: Normal rate and regular rhythm.     Heart sounds: Normal heart sounds.  Pulmonary:     Effort: Pulmonary effort is normal. No respiratory distress.     Breath sounds: Normal breath sounds.  Abdominal:     General: There is no distension.     Palpations: Abdomen is soft.     Tenderness: There is abdominal tenderness.     Comments: Mild diffuse abdominal tenderness with palpation.  Musculoskeletal:        General: No deformity. Normal range of motion.     Cervical back: Normal range of motion and neck supple.  Skin:    General: Skin is warm and dry.  Neurological:     General: No focal deficit present.     Mental Status: She is alert and oriented to person, place, and time.    ED Results / Procedures / Treatments   Labs (all labs ordered are listed, but only abnormal results are displayed) Labs Reviewed  CBC WITH DIFFERENTIAL/PLATELET - Abnormal; Notable for  the following components:      Result Value   WBC 11.3 (*)    Abs Immature Granulocytes 0.09 (*)    All other components within normal limits  COMPREHENSIVE METABOLIC PANEL - Abnormal; Notable for the following components:   Glucose, Bld 245 (*)    Calcium 8.8 (*)    Total Bilirubin 1.9 (*)    All other components within normal limits  LIPASE, BLOOD  ETHANOL  URINALYSIS, ROUTINE W REFLEX MICROSCOPIC  I-STAT BETA HCG BLOOD, ED ([facility], [facility], [facility] ONLY)    EKG None  Radiology CT ABDOMEN PELVIS W  CONTRAST  Result Date: 10/15/2021 CLINICAL DATA:  Abdominal pain beginning yesterday with nausea, vomiting and diarrhea. History of pancreatitis and diabetes. EXAM: CT ABDOMEN AND PELVIS WITH CONTRAST TECHNIQUE: Multidetector CT imaging of the abdomen and pelvis was performed using the standard protocol following bolus administration of intravenous contrast. RADIATION DOSE REDUCTION: This exam was performed according to the departmental dose-optimization program which includes automated exposure control, adjustment of the mA and/or kV according to patient size and/or use of iterative reconstruction technique. CONTRAST:  168m OMNIPAQUE IOHEXOL 300 MG/ML  SOLN COMPARISON:  09/09/2021, 01/25/2020 FINDINGS: Lower chest: Lung bases are unremarkable. Hepatobiliary: Previous cholecystectomy. Liver and biliary tree are unremarkable. Pancreas: Normal. Spleen: Normal. Adrenals/Urinary Tract: Right adrenal gland is normal. Two small stable left adrenal nodules likely adenomas. Kidneys are normal in size without hydronephrosis or nephrolithiasis. Ureters and bladder are normal. Stomach/Bowel: Stomach and small bowel are normal. Appendix is normal. Colon is normal. Vascular/Lymphatic: Mild calcified plaque over the abdominal aorta which is normal caliber. No evidence of adenopathy. Reproductive: Normal. Other: No free fluid or focal inflammatory change. Stable left rectus abdominus atrophy. Stable tiny ventral hernia in the  midline just above the umbilicus containing only peritoneal fat. Musculoskeletal: Minimal degenerative change of the spine. Interbody fusion at the L4-5 and L5-S1 levels. Degenerative change of the hips. IMPRESSION: 1. No acute findings in the abdomen/pelvis. 2. Two small stable left adrenal nodules likely adenomas. 3. Stable tiny midline ventral hernia just above the umbilicus containing only peritoneal fat. 4. Aortic atherosclerosis. Aortic Atherosclerosis (ICD10-I70.0). Electronically Signed   By: DMarin OlpM.D.   On: 10/15/2021 13:47    Procedures Procedures    Medications Ordered in ED Medications  ondansetron (ZOFRAN) injection 4 mg (4 mg Intravenous Given 10/15/21 1118)  morphine (PF) 4 MG/ML injection 4 mg (4 mg Intravenous Given 10/15/21 1117)  LORazepam (ATIVAN) injection 0.5 mg (0.5 mg Intravenous Given 10/15/21 1117)  iohexol (OMNIPAQUE) 300 MG/ML solution 100 mL (100 mLs Intravenous Contrast Given 10/15/21 1306)    ED Course/ Medical Decision Making/ A&P                           Medical Decision Making Amount and/or Complexity of Data Reviewed Labs: ordered. Radiology: ordered.  Risk Prescription drug management.    Medical Screen Complete  This patient presented to the ED with complaint of abdominal pain, nausea, vomiting, diarrhea.  This complaint involves an extensive number of treatment options. The initial differential diagnosis includes, but is not limited to, gastroenteritis, pancreatitis, other intra-abdominal pathology, metabolic abnormality, etc.  This presentation is: Acute, Chronic, Self-Limited, Previously Undiagnosed, Uncertain Prognosis, Complicated, Systemic Symptoms, and Threat to Life/Bodily Function  Patient is presenting with complaint of abdominal discomfort.  She also complains of nausea, vomiting, diarrhea  Symptoms began yesterday.  Patient is very tearful and distraught upon initial evaluation.  With administration of anxiolytics and  narcotics patient feels significantly improved.  Screening labs and CT imaging are without evidence of significant acute pathology.  Patient feels improved after ED evaluation and work-up.  She desires discharge home.  Patient does understand need for close outpatient follow-up.  Strict return precautions given and understood.   Co morbidities that complicated the patient's evaluation  Diabetes, hypertension, history of pancreatitis   Additional history obtained:  External records from outside sources obtained and reviewed including prior ED visits and prior Inpatient records.    Lab Tests:  I ordered and personally interpreted labs.  The pertinent results include: CBC, CMP, lipase, hCG, ethanol   Imaging Studies ordered:  I ordered imaging studies including CT abdomen pelvis I independently visualized and interpreted obtained imaging which showed no acute pathology I agree with the radiologist interpretation.   Cardiac Monitoring:  The patient was maintained on a cardiac monitor.  I personally viewed and interpreted the cardiac monitor which showed an underlying rhythm of: NSR   Medicines ordered:  I ordered medication including Ativan, morphine, Zofran for abdominal pain, anxiety Reevaluation of the patient after these medicines showed that the patient: improved  Problem List / ED Course:  Abdominal pain, nausea, vomiting, diarrhea   Reevaluation:  After the interventions noted above, I reevaluated the patient and found that they have: improved   Disposition:  After consideration of the diagnostic results and the patients response to treatment, I feel that the patent would benefit from close outpatient follow-up.          Final Clinical Impression(s) / ED Diagnoses Final diagnoses:  Generalized abdominal pain    Rx / DC Orders ED Discharge Orders          Ordered    ondansetron (ZOFRAN) 4 MG tablet  Every 6 hours        10/15/21 1414               Valarie Merino, MD 10/15/21 1423

## 2021-10-15 NOTE — Discharge Instructions (Addendum)
Return for any problem.  ?

## 2021-10-16 ENCOUNTER — Ambulatory Visit: Payer: Medicare Other

## 2021-10-16 ENCOUNTER — Other Ambulatory Visit: Payer: Self-pay | Admitting: Internal Medicine

## 2021-10-17 ENCOUNTER — Encounter (HOSPITAL_COMMUNITY): Payer: Self-pay

## 2021-10-17 ENCOUNTER — Inpatient Hospital Stay (HOSPITAL_COMMUNITY)
Admission: EM | Admit: 2021-10-17 | Discharge: 2021-10-20 | DRG: 392 | Disposition: A | Discharge status: Home / Self Care | Payer: Medicare Other | Attending: Internal Medicine | Admitting: Internal Medicine

## 2021-10-17 ENCOUNTER — Emergency Department (HOSPITAL_COMMUNITY): Payer: Medicare Other

## 2021-10-17 ENCOUNTER — Other Ambulatory Visit: Payer: Self-pay

## 2021-10-17 DIAGNOSIS — E1169 Type 2 diabetes mellitus with other specified complication: Secondary | ICD-10-CM

## 2021-10-17 DIAGNOSIS — Z981 Arthrodesis status: Secondary | ICD-10-CM

## 2021-10-17 DIAGNOSIS — Z794 Long term (current) use of insulin: Secondary | ICD-10-CM

## 2021-10-17 DIAGNOSIS — F17210 Nicotine dependence, cigarettes, uncomplicated: Secondary | ICD-10-CM | POA: Diagnosis present

## 2021-10-17 DIAGNOSIS — Z8616 Personal history of COVID-19: Secondary | ICD-10-CM

## 2021-10-17 DIAGNOSIS — R112 Nausea with vomiting, unspecified: Principal | ICD-10-CM | POA: Diagnosis present

## 2021-10-17 DIAGNOSIS — I10 Essential (primary) hypertension: Secondary | ICD-10-CM | POA: Diagnosis not present

## 2021-10-17 DIAGNOSIS — F419 Anxiety disorder, unspecified: Secondary | ICD-10-CM | POA: Diagnosis present

## 2021-10-17 DIAGNOSIS — K760 Fatty (change of) liver, not elsewhere classified: Secondary | ICD-10-CM | POA: Diagnosis present

## 2021-10-17 DIAGNOSIS — Z90710 Acquired absence of both cervix and uterus: Secondary | ICD-10-CM

## 2021-10-17 DIAGNOSIS — E669 Obesity, unspecified: Principal | ICD-10-CM | POA: Diagnosis present

## 2021-10-17 DIAGNOSIS — K219 Gastro-esophageal reflux disease without esophagitis: Secondary | ICD-10-CM | POA: Diagnosis present

## 2021-10-17 DIAGNOSIS — Z79899 Other long term (current) drug therapy: Secondary | ICD-10-CM

## 2021-10-17 DIAGNOSIS — Z9049 Acquired absence of other specified parts of digestive tract: Secondary | ICD-10-CM

## 2021-10-17 DIAGNOSIS — M797 Fibromyalgia: Secondary | ICD-10-CM | POA: Diagnosis present

## 2021-10-17 DIAGNOSIS — E1165 Type 2 diabetes mellitus with hyperglycemia: Secondary | ICD-10-CM | POA: Diagnosis present

## 2021-10-17 DIAGNOSIS — F1210 Cannabis abuse, uncomplicated: Secondary | ICD-10-CM | POA: Diagnosis present

## 2021-10-17 DIAGNOSIS — R1011 Right upper quadrant pain: Secondary | ICD-10-CM

## 2021-10-17 DIAGNOSIS — Z833 Family history of diabetes mellitus: Secondary | ICD-10-CM

## 2021-10-17 DIAGNOSIS — Z8249 Family history of ischemic heart disease and other diseases of the circulatory system: Secondary | ICD-10-CM

## 2021-10-17 DIAGNOSIS — Z7982 Long term (current) use of aspirin: Secondary | ICD-10-CM

## 2021-10-17 DIAGNOSIS — R1013 Epigastric pain: Secondary | ICD-10-CM

## 2021-10-17 DIAGNOSIS — G8929 Other chronic pain: Secondary | ICD-10-CM | POA: Diagnosis present

## 2021-10-17 LAB — URINALYSIS, ROUTINE W REFLEX MICROSCOPIC
Bilirubin Urine: NEGATIVE
Glucose, UA: >=500 mg/dL — AB
Hgb urine dipstick: NEGATIVE
Ketones, ur: 20 mg/dL — AB
Leukocytes,Ua: NEGATIVE
Nitrite: NEGATIVE
Protein, ur: NEGATIVE mg/dL
Specific Gravity, Urine: 1.016 (ref 1.005–1.030)
pH: 9 — ABNORMAL HIGH (ref 5.0–8.0)

## 2021-10-17 LAB — CBC WITH DIFFERENTIAL/PLATELET
Abs Immature Granulocytes: 0.15 10*3/uL — ABNORMAL HIGH (ref 0.00–0.07)
Basophils Absolute: 0 10*3/uL (ref 0.0–0.1)
Basophils Relative: 0 %
Eosinophils Absolute: 0 10*3/uL (ref 0.0–0.5)
Eosinophils Relative: 0 %
HCT: 41.4 % (ref 36.0–46.0)
Hemoglobin: 13.9 g/dL (ref 12.0–15.0)
Immature Granulocytes: 1 %
Lymphocytes Relative: 20 %
Lymphs Abs: 2.2 10*3/uL (ref 0.7–4.0)
MCH: 28.2 pg (ref 26.0–34.0)
MCHC: 33.6 g/dL (ref 30.0–36.0)
MCV: 84 fL (ref 80.0–100.0)
Monocytes Absolute: 0.5 10*3/uL (ref 0.1–1.0)
Monocytes Relative: 5 %
Neutro Abs: 8 10*3/uL — ABNORMAL HIGH (ref 1.7–7.7)
Neutrophils Relative %: 74 %
Platelets: 359 10*3/uL (ref 150–400)
RBC: 4.93 MIL/uL (ref 3.87–5.11)
RDW: 13.5 % (ref 11.5–15.5)
WBC: 10.8 10*3/uL — ABNORMAL HIGH (ref 4.0–10.5)
nRBC: 0 % (ref 0.0–0.2)

## 2021-10-17 LAB — CBC
HCT: 39.3 % (ref 35.0–45.0)
Hemoglobin: 12.9 g/dL (ref 11.7–15.5)
MCH: 28 pg (ref 27.0–33.0)
MCHC: 32.8 g/dL (ref 32.0–36.0)
MCV: 85.2 fL (ref 80.0–100.0)
MPV: 9.6 fL (ref 7.5–12.5)
Platelets: 376 10*3/uL (ref 140–400)
RBC: 4.61 10*6/uL (ref 3.80–5.10)
RDW: 13.2 % (ref 11.0–15.0)
WBC: 10 10*3/uL (ref 3.8–10.8)

## 2021-10-17 LAB — COMPLETE METABOLIC PANEL WITH GFR
AG Ratio: 1.7 (calc) (ref 1.0–2.5)
ALT: 18 U/L (ref 6–29)
AST: 14 U/L (ref 10–35)
Albumin: 4.1 g/dL (ref 3.6–5.1)
Alkaline phosphatase (APISO): 127 U/L (ref 37–153)
BUN: 12 mg/dL (ref 7–25)
CO2: 25 mmol/L (ref 20–32)
Calcium: 9.3 mg/dL (ref 8.6–10.4)
Chloride: 100 mmol/L (ref 98–110)
Creat: 0.7 mg/dL (ref 0.50–1.03)
Globulin: 2.4 g/dL (calc) (ref 1.9–3.7)
Glucose, Bld: 378 mg/dL — ABNORMAL HIGH (ref 65–99)
Potassium: 4.2 mmol/L (ref 3.5–5.3)
Sodium: 138 mmol/L (ref 135–146)
Total Bilirubin: 0.4 mg/dL (ref 0.2–1.2)
Total Protein: 6.5 g/dL (ref 6.1–8.1)
eGFR: 101 mL/min/{1.73_m2} (ref >=60)

## 2021-10-17 LAB — CBG MONITORING, ED: Glucose-Capillary: 331 mg/dL — ABNORMAL HIGH (ref 70–99)

## 2021-10-17 LAB — COMPREHENSIVE METABOLIC PANEL
ALT: 24 U/L (ref 0–44)
AST: 20 U/L (ref 15–41)
Albumin: 4.6 g/dL (ref 3.5–5.0)
Alkaline Phosphatase: 122 U/L (ref 38–126)
Anion gap: 11 (ref 5–15)
BUN: 8 mg/dL (ref 6–20)
CO2: 25 mmol/L (ref 22–32)
Calcium: 9.3 mg/dL (ref 8.9–10.3)
Chloride: 102 mmol/L (ref 98–111)
Creatinine, Ser: 0.51 mg/dL (ref 0.44–1.00)
GFR, Estimated: >60 mL/min (ref >60)
Glucose, Bld: 279 mg/dL — ABNORMAL HIGH (ref 70–99)
Potassium: 3.7 mmol/L (ref 3.5–5.1)
Sodium: 138 mmol/L (ref 135–145)
Total Bilirubin: 0.5 mg/dL (ref 0.3–1.2)
Total Protein: 8.2 g/dL — ABNORMAL HIGH (ref 6.5–8.1)

## 2021-10-17 LAB — LIPID PANEL
Cholesterol: 145 mg/dL (ref <200)
HDL: 41 mg/dL — ABNORMAL LOW (ref >=50)
LDL Cholesterol (Calc): 77 mg/dL (calc)
Non-HDL Cholesterol (Calc): 104 mg/dL (calc) (ref <130)
Total CHOL/HDL Ratio: 3.5 (calc) (ref <5.0)
Triglycerides: 169 mg/dL — ABNORMAL HIGH (ref <150)

## 2021-10-17 LAB — ETHANOL: Alcohol, Ethyl (B): <10 mg/dL (ref <10)

## 2021-10-17 LAB — RAPID URINE DRUG SCREEN, HOSP PERFORMED
Amphetamines: NOT DETECTED
Barbiturates: NOT DETECTED
Benzodiazepines: NOT DETECTED
Cocaine: NOT DETECTED
Opiates: POSITIVE — AB
Tetrahydrocannabinol: POSITIVE — AB

## 2021-10-17 LAB — VITAMIN D 25 HYDROXY (VIT D DEFICIENCY, FRACTURES): Vit D, 25-Hydroxy: 21 ng/mL — ABNORMAL LOW (ref 30–100)

## 2021-10-17 LAB — POC OCCULT BLOOD, ED: Fecal Occult Bld: NEGATIVE

## 2021-10-17 LAB — TROPONIN I (HIGH SENSITIVITY)
Troponin I (High Sensitivity): 4 ng/L (ref <18)
Troponin I (High Sensitivity): 4 ng/L (ref <18)

## 2021-10-17 LAB — LIPASE, BLOOD: Lipase: 38 U/L (ref 11–51)

## 2021-10-17 LAB — TSH: TSH: 0.38 mIU/L — ABNORMAL LOW (ref 0.40–4.50)

## 2021-10-17 LAB — I-STAT BETA HCG BLOOD, ED (MC, WL, AP ONLY): I-stat hCG, quantitative: <5 m[IU]/mL (ref <5)

## 2021-10-17 MED ORDER — ASPIRIN EC 81 MG PO TBEC
81.0000 mg | DELAYED_RELEASE_TABLET | Freq: Every day | ORAL | Status: DC
Start: 2021-10-18 — End: 2021-10-20
  Administered 2021-10-18 – 2021-10-20 (×3): 81 mg via ORAL
  Filled 2021-10-17 (×3): qty 1

## 2021-10-17 MED ORDER — SODIUM CHLORIDE 0.9% FLUSH
3.0000 mL | Freq: Two times a day (BID) | INTRAVENOUS | Status: DC
  Administered 2021-10-17 – 2021-10-20 (×6): 3 mL via INTRAVENOUS

## 2021-10-17 MED ORDER — SODIUM CHLORIDE 0.9 % IV SOLN: INTRAVENOUS | Status: AC

## 2021-10-17 MED ORDER — METOPROLOL SUCCINATE ER 50 MG PO TB24
50.0000 mg | ORAL_TABLET | Freq: Every day | ORAL | Status: DC
Start: 2021-10-18 — End: 2021-10-20
  Administered 2021-10-18 – 2021-10-20 (×3): 50 mg via ORAL
  Filled 2021-10-17 (×3): qty 1

## 2021-10-17 MED ORDER — FENTANYL CITRATE PF 50 MCG/ML IJ SOSY
50.0000 ug | PREFILLED_SYRINGE | Freq: Once | INTRAMUSCULAR | Status: AC
  Administered 2021-10-17: 50 ug via INTRAVENOUS
  Filled 2021-10-17: qty 1

## 2021-10-17 MED ORDER — MORPHINE SULFATE (PF) 4 MG/ML IV SOLN
4.0000 mg | Freq: Once | INTRAVENOUS | Status: AC
  Administered 2021-10-17: 4 mg via INTRAVENOUS
  Filled 2021-10-17: qty 1

## 2021-10-17 MED ORDER — HYDROMORPHONE HCL 1 MG/ML IJ SOLN
0.5000 mg | INTRAMUSCULAR | Status: DC | PRN
  Administered 2021-10-17 – 2021-10-18 (×4): 1 mg via INTRAVENOUS
  Filled 2021-10-17 (×4): qty 1

## 2021-10-17 MED ORDER — LIDOCAINE VISCOUS HCL 2 % MT SOLN
15.0000 mL | Freq: Once | OROMUCOSAL | Status: AC
  Administered 2021-10-17: 15 mL via ORAL
  Filled 2021-10-17: qty 15

## 2021-10-17 MED ORDER — ALBUTEROL SULFATE (2.5 MG/3ML) 0.083% IN NEBU: 2.5000 mg | INHALATION_SOLUTION | RESPIRATORY_TRACT | Status: DC | PRN

## 2021-10-17 MED ORDER — LACTATED RINGERS IV BOLUS
1000.0000 mL | Freq: Once | INTRAVENOUS | Status: AC
  Administered 2021-10-17: 1000 mL via INTRAVENOUS

## 2021-10-17 MED ORDER — IOHEXOL 300 MG/ML  SOLN
100.0000 mL | Freq: Once | INTRAMUSCULAR | Status: AC | PRN
  Administered 2021-10-17: 100 mL via INTRAVENOUS

## 2021-10-17 MED ORDER — DICYCLOMINE HCL 20 MG PO TABS
20.0000 mg | ORAL_TABLET | Freq: Two times a day (BID) | ORAL | Status: DC
Start: 2021-10-17 — End: 2021-10-20
  Administered 2021-10-17 – 2021-10-20 (×6): 20 mg via ORAL
  Filled 2021-10-17 (×6): qty 1

## 2021-10-17 MED ORDER — PANTOPRAZOLE SODIUM 20 MG PO TBEC
20.0000 mg | DELAYED_RELEASE_TABLET | Freq: Every day | ORAL | Status: DC
  Administered 2021-10-18 – 2021-10-20 (×3): 20 mg via ORAL
  Filled 2021-10-17 (×3): qty 1

## 2021-10-17 MED ORDER — LOPERAMIDE HCL 2 MG PO CAPS: 2.0000 mg | ORAL_CAPSULE | Freq: Four times a day (QID) | ORAL | Status: DC | PRN

## 2021-10-17 MED ORDER — ONDANSETRON HCL 4 MG/2ML IJ SOLN
4.0000 mg | Freq: Once | INTRAMUSCULAR | Status: AC
Start: 2021-10-17 — End: 2021-10-17
  Administered 2021-10-17: 4 mg via INTRAVENOUS
  Filled 2021-10-17: qty 2

## 2021-10-17 MED ORDER — ALUM & MAG HYDROXIDE-SIMETH 200-200-20 MG/5ML PO SUSP
30.0000 mL | Freq: Once | ORAL | Status: AC
  Administered 2021-10-17: 30 mL via ORAL
  Filled 2021-10-17: qty 30

## 2021-10-17 MED ORDER — INSULIN ASPART 100 UNIT/ML IJ SOLN
0.0000 [IU] | Freq: Three times a day (TID) | INTRAMUSCULAR | Status: DC
  Administered 2021-10-18: 18:00:00 2 [IU] via SUBCUTANEOUS
  Administered 2021-10-18: 14:00:00 3 [IU] via SUBCUTANEOUS
  Administered 2021-10-18 – 2021-10-19 (×3): 5 [IU] via SUBCUTANEOUS
  Administered 2021-10-19: 8 [IU] via SUBCUTANEOUS
  Administered 2021-10-20: 5 [IU] via SUBCUTANEOUS
  Filled 2021-10-17: qty 0.15

## 2021-10-17 MED ORDER — IRBESARTAN 150 MG PO TABS
300.0000 mg | ORAL_TABLET | Freq: Every day | ORAL | Status: DC
  Administered 2021-10-18 – 2021-10-20 (×3): 300 mg via ORAL
  Filled 2021-10-17: qty 1
  Filled 2021-10-17 (×2): qty 2

## 2021-10-17 MED ORDER — HYDROCODONE-ACETAMINOPHEN 5-325 MG PO TABS
1.0000 | ORAL_TABLET | Freq: Four times a day (QID) | ORAL | Status: DC | PRN
Start: 2021-10-17 — End: 2021-10-19
  Administered 2021-10-18 – 2021-10-19 (×3): 1 via ORAL
  Filled 2021-10-17 (×3): qty 1

## 2021-10-17 MED ORDER — ENOXAPARIN SODIUM 40 MG/0.4ML IJ SOSY
40.0000 mg | PREFILLED_SYRINGE | INTRAMUSCULAR | Status: DC
  Administered 2021-10-17 – 2021-10-19 (×3): 40 mg via SUBCUTANEOUS
  Filled 2021-10-17 (×3): qty 0.4

## 2021-10-17 MED ORDER — INSULIN GLARGINE-YFGN 100 UNIT/ML ~~LOC~~ SOLN
30.0000 [IU] | Freq: Every day | SUBCUTANEOUS | Status: DC
  Administered 2021-10-17 – 2021-10-18 (×2): 30 [IU] via SUBCUTANEOUS
  Filled 2021-10-17 (×3): qty 0.3

## 2021-10-17 MED ORDER — DROPERIDOL 2.5 MG/ML IJ SOLN: 1.2500 mg | Freq: Once | INTRAMUSCULAR | Status: DC

## 2021-10-17 MED ORDER — CAPSAICIN 0.075 % EX CREA
TOPICAL_CREAM | Freq: Two times a day (BID) | CUTANEOUS | Status: DC
  Filled 2021-10-17: qty 60

## 2021-10-17 MED ORDER — ESCITALOPRAM OXALATE 20 MG PO TABS
20.0000 mg | ORAL_TABLET | Freq: Every day | ORAL | Status: DC
  Administered 2021-10-18 – 2021-10-20 (×3): 20 mg via ORAL
  Filled 2021-10-17 (×2): qty 1
  Filled 2021-10-17: qty 2

## 2021-10-17 MED ORDER — LIDOCAINE VISCOUS HCL 2 % MT SOLN
15.0000 mL | Freq: Once | OROMUCOSAL | Status: DC
  Filled 2021-10-17: qty 15

## 2021-10-17 MED ORDER — ACETAMINOPHEN 325 MG PO TABS: 650.0000 mg | ORAL_TABLET | Freq: Four times a day (QID) | ORAL | Status: DC | PRN

## 2021-10-17 MED ORDER — ONDANSETRON HCL 4 MG/2ML IJ SOLN: 4.0000 mg | Freq: Four times a day (QID) | INTRAMUSCULAR | Status: DC | PRN

## 2021-10-17 MED ORDER — AMLODIPINE BESYLATE 5 MG PO TABS
5.0000 mg | ORAL_TABLET | Freq: Every day | ORAL | Status: DC
  Administered 2021-10-18 – 2021-10-20 (×3): 5 mg via ORAL
  Filled 2021-10-17 (×3): qty 1

## 2021-10-17 MED ORDER — ACETAMINOPHEN 650 MG RE SUPP: 650.0000 mg | Freq: Four times a day (QID) | RECTAL | Status: DC | PRN

## 2021-10-17 MED ORDER — ONDANSETRON HCL 4 MG/2ML IJ SOLN
4.0000 mg | Freq: Once | INTRAMUSCULAR | Status: AC
  Administered 2021-10-17: 4 mg via INTRAVENOUS
  Filled 2021-10-17: qty 2

## 2021-10-17 MED ORDER — LACTATED RINGERS IV BOLUS: 1000.0000 mL | Freq: Once | INTRAVENOUS | Status: DC

## 2021-10-17 MED ORDER — ALPRAZOLAM 1 MG PO TABS
1.0000 mg | ORAL_TABLET | Freq: Every day | ORAL | Status: DC | PRN
  Administered 2021-10-20: 1 mg via ORAL
  Filled 2021-10-17: qty 1

## 2021-10-17 MED ORDER — ALUM & MAG HYDROXIDE-SIMETH 200-200-20 MG/5ML PO SUSP
30.0000 mL | Freq: Once | ORAL | Status: DC
Start: 2021-10-17 — End: 2021-10-20
  Filled 2021-10-17: qty 30

## 2021-10-17 MED ORDER — AMLODIPINE-OLMESARTAN 5-40 MG PO TABS: 1.0000 | ORAL_TABLET | Freq: Every day | ORAL | Status: DC

## 2021-10-17 MED ORDER — SIMVASTATIN 10 MG PO TABS
20.0000 mg | ORAL_TABLET | Freq: Every day | ORAL | Status: DC
Start: 2021-10-18 — End: 2021-10-20
  Administered 2021-10-18 – 2021-10-20 (×3): 20 mg via ORAL
  Filled 2021-10-17 (×2): qty 2
  Filled 2021-10-17: qty 1

## 2021-10-17 NOTE — ED Notes (Signed)
Advised RN patient is requesting pain meds.  ?

## 2021-10-17 NOTE — ED Notes (Signed)
Advised RN patient is asking aobut pain meds ?

## 2021-10-17 NOTE — ED Provider Notes (Signed)
[facility] DEPT Provider Note   CSN: [id number] Arrival date & time: 10/17/21  1244     History No chief complaint on file.   [name] is a 56 y.o. female with h/o pancreatitis, DM, marijuana abuse, HTN, intractable nasuea/vomiting presents to the ED for evaluation of continued upper abdominal pain with nausea and vomiting over the past few days. She denies any diarrhea, melena, hematochezia, coffee ground emesis, hematemesis, chest pain, or SOB. The patient was recently seen in the ER for this complaint 2 days ago and was discharged home on Zofran which the patient reports she did not take. She reports she thinks it is pancreatitis. She was recently admitted for this problem as well, but has not followed up with GI at [facility].   HPI     Home Medications Prior to Admission medications   Medication Sig Start Date End Date Taking? Authorizing Provider  acetaminophen (TYLENOL) 500 MG tablet Take 500 mg by mouth 2 (two) times daily as needed for moderate pain.    [provider]  albuterol (PROVENTIL HFA;VENTOLIN HFA) 108 (90 BASE) MCG/ACT inhaler Inhale 2 puffs into the lungs every 4 (four) hours as needed for wheezing.    [provider]  albuterol (PROVENTIL) (2.5 MG/3ML) 0.083% nebulizer solution Take 3 mLs (2.5 mg total) by nebulization every 6 (six) hours as needed for wheezing or shortness of breath. 12/12/19   Tasia Catchings, Amy V, PA-C  ALPRAZolam Duanne Moron) 1 MG tablet Take 1 mg by mouth daily as needed for anxiety. 10/23/19   [provider]  amLODipine-olmesartan (AZOR) 5-40 MG tablet Take 1 tablet by mouth daily. 06/05/21   [provider]  aspirin EC 81 MG tablet Take 81 mg by mouth daily. Swallow whole.    [provider]  celecoxib (CELEBREX) 200 MG capsule Take 1 capsule (200 mg total) by mouth 2 (two) times daily. 09/03/21   Jessy Oto, MD  cetirizine (ZYRTEC) 10 MG tablet Take 10 mg by mouth daily as  needed for allergies. 09/27/20   [provider]  cyclobenzaprine (FLEXERIL) 10 MG tablet Take 1 tablet (10 mg total) by mouth 2 (two) times daily as needed for muscle spasms. 11/27/20   Jessy Oto, MD  dicyclomine (BENTYL) 20 MG tablet Take 1 tablet (20 mg total) by mouth 2 (two) times daily. 04/20/21   Henderly, Britni A, PA-C  escitalopram (LEXAPRO) 20 MG tablet Take 20 mg by mouth daily. 08/10/20   [provider]  fluticasone (FLONASE) 50 MCG/ACT nasal spray Place 1 spray into both nostrils daily as needed for allergies.    [provider]  HYDROcodone-acetaminophen (NORCO/VICODIN) 5-325 MG tablet Take 1 tablet by mouth every 6 (six) hours as needed for moderate pain. 09/14/21   Mariel Aloe, MD  insulin glargine (LANTUS) 100 unit/mL SOPN Inject 60 Units into the skin daily at 2 PM.    [provider]  loperamide (IMODIUM) 2 MG capsule Take 1 capsule (2 mg total) by mouth 4 (four) times daily as needed for diarrhea or loose stools. 09/09/21   Hayden Rasmussen, MD  metFORMIN (GLUCOPHAGE) 1000 MG tablet Take 1,000 mg by mouth 2 (two) times daily. 03/26/21   [provider]  metoprolol succinate (TOPROL-XL) 50 MG 24 hr tablet Take 50 mg by mouth daily. 07/21/14   [provider]  Multiple Vitamins-Minerals (CENTRUM PO) Take 1 tablet by mouth daily.    [provider]  ondansetron (ZOFRAN ODT) 4  MG disintegrating tablet Take 1 tablet (4 mg total) by mouth every 8 (eight) hours as needed for nausea or vomiting. 09/09/21   Hayden Rasmussen, MD  ondansetron (ZOFRAN) 4 MG tablet Take 1 tablet (4 mg total) by mouth every 6 (six) hours. 10/15/21   Valarie Merino, MD  pantoprazole (PROTONIX) 20 MG tablet Take 1 tablet (20 mg total) by mouth daily. 06/22/21 09/13/21  Loni Beckwith, PA-C  simvastatin (ZOCOR) 20 MG tablet Take 20 mg by mouth daily. 08/22/20   [provider]  promethazine (PHENERGAN) 25 MG tablet Take 1 tablet (25 mg  total) by mouth every 6 (six) hours as needed for nausea or vomiting. Patient not taking: No sig reported 08/15/20 10/19/20  Wieters, Madelynn Done C, PA-C      Allergies    Patient has no known allergies.    Review of Systems   Review of Systems  Constitutional:  Negative for chills and fever.  Respiratory:  Negative for shortness of breath.   Cardiovascular:  Negative for chest pain.  Gastrointestinal:  Positive for abdominal pain, nausea and vomiting. Negative for abdominal distention and diarrhea.  Genitourinary:  Negative for dysuria and hematuria.   Physical Exam Updated Vital Signs BP (!) 150/99 (BP Location: Left Arm)    Pulse 84    Temp 97.7 F (36.5 C) (Oral)    Resp 19    Ht '5\' 4"'$  (1.626 m)    Wt 97 kg    SpO2 100%    BMI 36.71 kg/m  Physical Exam Vitals and nursing note reviewed.  Constitutional:      Appearance: She is ill-appearing.     Comments: Tearful  HENT:     Mouth/Throat:     Mouth: Mucous membranes are moist.     Pharynx: No pharyngeal swelling or oropharyngeal exudate.  Cardiovascular:     Rate and Rhythm: Normal rate and regular rhythm.  Pulmonary:     Effort: Pulmonary effort is normal. No respiratory distress.     Breath sounds: Normal breath sounds.  Abdominal:     General: Bowel sounds are normal. There is no distension.     Palpations: Abdomen is soft.     Tenderness: There is generalized abdominal tenderness. There is no right CVA tenderness, left CVA tenderness or rebound.     Comments: Abdominal exam limited secondary to body habitus. Soft, non-distended abdomen. Diffuse tenderness to palpation. Some voluntary guarding. No rebound tenderness. No CVA tenderness.  Skin:    General: Skin is warm and dry.  Neurological:     Mental Status: She is alert.    ED Results / Procedures / Treatments   Labs (all labs ordered are listed, but only abnormal results are displayed) Labs Reviewed  COMPREHENSIVE METABOLIC PANEL  LIPASE, BLOOD  CBC WITH  DIFFERENTIAL/PLATELET  URINALYSIS, ROUTINE W REFLEX MICROSCOPIC  I-STAT BETA HCG BLOOD, ED ([facility], [facility], [facility] ONLY)    EKG EKG Interpretation  Date/Time:  Thursday October 18 2021 01:44:19 EST Ventricular Rate:  86 PR Interval:  181 QRS Duration: 98 QT Interval:  441 QTC Calculation: 528 R Axis:   18 Text Interpretation: Sinus rhythm Anteroseptal infarct, age indeterminate Prolonged QT interval similar to yesterday Confirmed by Sherwood Gambler [phone]) on 10/19/2021 8:58:46 AM  Radiology CT ABDOMEN PELVIS W CONTRAST  Result Date: 10/15/2021 CLINICAL DATA:  Abdominal pain beginning yesterday with nausea, vomiting and diarrhea. History of pancreatitis and diabetes. EXAM: CT ABDOMEN AND PELVIS WITH CONTRAST TECHNIQUE: Multidetector CT imaging of the  abdomen and pelvis was performed using the standard protocol following bolus administration of intravenous contrast. RADIATION DOSE REDUCTION: This exam was performed according to the departmental dose-optimization program which includes automated exposure control, adjustment of the mA and/or kV according to patient size and/or use of iterative reconstruction technique. CONTRAST:  11m OMNIPAQUE IOHEXOL 300 MG/ML  SOLN COMPARISON:  09/09/2021, 01/25/2020 FINDINGS: Lower chest: Lung bases are unremarkable. Hepatobiliary: Previous cholecystectomy. Liver and biliary tree are unremarkable. Pancreas: Normal. Spleen: Normal. Adrenals/Urinary Tract: Right adrenal gland is normal. Two small stable left adrenal nodules likely adenomas. Kidneys are normal in size without hydronephrosis or nephrolithiasis. Ureters and bladder are normal. Stomach/Bowel: Stomach and small bowel are normal. Appendix is normal. Colon is normal. Vascular/Lymphatic: Mild calcified plaque over the abdominal aorta which is normal caliber. No evidence of adenopathy. Reproductive: Normal. Other: No free fluid or focal inflammatory change. Stable left rectus abdominus atrophy. Stable tiny ventral hernia  in the midline just above the umbilicus containing only peritoneal fat. Musculoskeletal: Minimal degenerative change of the spine. Interbody fusion at the L4-5 and L5-S1 levels. Degenerative change of the hips. IMPRESSION: 1. No acute findings in the abdomen/pelvis. 2. Two small stable left adrenal nodules likely adenomas. 3. Stable tiny midline ventral hernia just above the umbilicus containing only peritoneal fat. 4. Aortic atherosclerosis. Aortic Atherosclerosis (ICD10-I70.0). Electronically Signed   By: DMarin OlpM.D.   On: 10/15/2021 13:47    Procedures Procedures   Medications Ordered in ED   ED Course/ Medical Decision Making/ A&P                           Medical Decision Making Amount and/or Complexity of Data Reviewed Labs: ordered. Radiology: ordered.  Risk OTC drugs. Prescription drug management. Decision regarding hospitalization.   56y/o F with h/o pancreatitis, DM, marijuana abuse, HTN, intractable nasuea/vomiting presents to the ED for evaluation of continued upper abdominal pain with nausea and vomiting over the past few days.  Differential diagnosis includes but is not limited to peptic ulcer disease, gastritis, perforated ulcer, pancreatitis, small bowel obstruction, ACS.  Vitals show elevated blood pressure but afebrile, normal pulse rate, satting well on room air without any respiratory distress.  Physical exam pertinent for tearful ill-appearing patient crying and walking around the room. Abdominal exam limited secondary to body habitus. Soft, non-distended abdomen. Diffuse tenderness to palpation. Some voluntary guarding. No rebound tenderness. No CVA tenderness.  Given the voluntary guarding but the tearfulness and pain of the patient, will rescan the patient even though she had a scan 2 days ago.  I independently reviewed and interpreted the patient's labs and imaging and agree with radiologist findings.  CBC shows slightly elevated white blood cell count at  10.8 with a left shift.  Elevated absolute immature granulocytes.  Urinalysis shows straw-colored urine that is basic with the increase in glucose greater than 500 with 20 ketones.  No nitrates or leukocytes.  Not consistent with UTI.  Lipase is normal.  Hemoccult negative.  Ethanol less than 10.  Troponin 4.  CMP shows elevated glucose of 279 which appears to be patient's baseline comparison to previous labs.  CT abdomen shows no acute findings in the abdomen or pelvis and there is no significant change since previous CT 2 days ago on 10/15/21.  I was considering that he the patient may have some biliary dilation does not picked up on CT scan and that she has a cholecystectomy.  Ordered right  upper quadrant ultrasound which just showed a cholecystectomy and an echogenic liver that is related to fatty infiltration.,  Bile duct was 5.7 mm.  At this time, the patient has received fluids, multiple rounds of pain management, GI cocktails, and Zofran and is still having intractable nausea and vomiting with some pain intermittently between her sleeping.  EKG shows prolonged QT, so will need to hold on Zofran.  Given the patient was just here 2 days ago for the same complaints and is not having good outpatient management of her pain and vomiting, will admit to medicine. Admit to Dr. Nevada Crane.  I discussed this case with my attending physician who cosigned this note including patient's presenting symptoms, physical exam, and planned diagnostics and interventions. Attending physician stated agreement with plan or made changes to plan which were implemented.   Final Clinical Impression(s) / ED Diagnoses Final diagnoses:  RUQ pain  Epigastric pain    Rx / DC Orders ED Discharge Orders     None         Sherrell Puller, PA-C 10/20/21 0009    Daleen Bo, MD 10/22/21 1335

## 2021-10-17 NOTE — H&P (Signed)
History and Physical   [name] WGN:[id number] DOB: [birth year] DOA: 10/17/2021  PCP: Nolene Ebbs, MD   Patient coming from: Home  Chief Complaint: Abdominal pain, nausea and vomiting  HPI: [name] is a 56 y.o. female with medical history significant of cervical spinal fusion, marijuana use, intractable nausea and vomiting, hypertension, diabetes, obesity, chronic pain, anxiety, GERD, pancreatitis presenting with abdominal pain and recurrent intractable nausea vomiting.  Patient has had repeated issues with abdominal pain and intractable nausea vomiting.  Has had admission for similar in the past with concern for IBS versus other versus marijuana hyperemesis.  Similar presentation 2 days ago was discharged with Zofran at that time.  Has had continue intractable nausea vomiting despite Zofran use.  Reports continued marijuana use.  Denies fevers, chills, chest pain, shortness of breath, constipation, diarrhea.  ED Course: Vital signs in the ED significant for respiratory rate in the teens to 20s and blood pressure in the 784O to 962 systolic.  Lab work-up included CMP with glucose 279, protein 8.2.  CBC with mild leukocytosis at 10.8.  Troponin negative with repeat pending.  Lipase normal.  FOBT negative.  Urinalysis with glucose, ketones, rare bacteria.  Ethanol level negative.  CT abdomen pelvis without acute abnormality ultrasound right upper quadrant during patient status post cholecystectomy and echogenicity of the liver suspicious for fatty infiltration.  Patient received fentanyl, morphine, GI cocktail, Zofran, viscous lidocaine, fluids in the ED.  Review of Systems: As per HPI otherwise all other systems reviewed and are negative.  Past Medical History:  Diagnosis Date   Anxiety    Arthritis    rheumatoid , osteoarthritis   Bronchitis    Chronic back pain    Depression    Diabetes mellitus    Fatty liver    Fibromyalgia    GERD (gastroesophageal reflux  disease)    Headache    Hypertension    Pancreatitis    Pneumonia    Rotator cuff tear, right     Past Surgical History:  Procedure Laterality Date   ABDOMINAL HYSTERECTOMY     ANTERIOR CERVICAL DECOMP/DISCECTOMY FUSION N/A 07/21/2017   Procedure: ANTERIOR CERVICAL DISCECTOMY AND FUSION C6-7, REMOVAL OF SCREW C6 PLATE, DEPUY ZERO-P IMPLANT, LOCAL BONE GRAFT, ALLOGRAFT BONE GRAFT, VIVIGEN;  Surgeon: Jessy Oto, MD;  Location: ;  Service: Orthopedics;  Laterality: N/A;   BACK SURGERY     laminectomy   BONE GRAFT HIP ILIAC CREST     BREAST BIOPSY     CARPAL TUNNEL RELEASE     CERVICAL DISCECTOMY  07/21/2017   CESAREAN SECTION     CHOLECYSTECTOMY     COLONOSCOPY     frontal fusion     neck fusion     patient reported   SHOULDER ARTHROSCOPY WITH SUBACROMIAL DECOMPRESSION, ROTATOR CUFF REPAIR AND BICEP TENDON REPAIR Right 10/31/2017   Procedure: RIGHT SHOULDER ARTHROSCOPY, MANIPULATION UNDER ANESTHESIA, BICEPS TENODESIS, AND MINI OPEN ROTATOR CUFF TEAR REPAIR;  Surgeon: Meredith Pel, MD;  Location: [facility];  Service: Orthopedics;  Laterality: Right;    Social History  reports that she has been smoking cigarettes. She has a 40.00 pack-year smoking history. She has never used smokeless tobacco. She reports current drug use. Drug: Marijuana. She reports that she does not drink alcohol.  No Known Allergies  Family History  Problem Relation Age of Onset   Hypertension Father    Renal Disease Father    Diabetes Mother    Hypertension Mother  Edema Mother    Diabetes Sister    Diabetes Brother   Reviewed on admission  Prior to Admission medications   Medication Sig Start Date End Date Taking? Authorizing Provider  acetaminophen (TYLENOL) 500 MG tablet Take 500 mg by mouth 2 (two) times daily as needed for moderate pain.    [provider]  albuterol (PROVENTIL HFA;VENTOLIN HFA) 108 (90 BASE) MCG/ACT inhaler Inhale 2 puffs into the lungs every 4 (four) hours  as needed for wheezing.    [provider]  albuterol (PROVENTIL) (2.5 MG/3ML) 0.083% nebulizer solution Take 3 mLs (2.5 mg total) by nebulization every 6 (six) hours as needed for wheezing or shortness of breath. 12/12/19   Tasia Catchings, Amy V, PA-C  ALPRAZolam Duanne Moron) 1 MG tablet Take 1 mg by mouth daily as needed for anxiety. 10/23/19   [provider]  amLODipine-olmesartan (AZOR) 5-40 MG tablet Take 1 tablet by mouth daily. 06/05/21   [provider]  aspirin EC 81 MG tablet Take 81 mg by mouth daily. Swallow whole.    [provider]  celecoxib (CELEBREX) 200 MG capsule Take 1 capsule (200 mg total) by mouth 2 (two) times daily. 09/03/21   Jessy Oto, MD  cetirizine (ZYRTEC) 10 MG tablet Take 10 mg by mouth daily as needed for allergies. 09/27/20   [provider]  cyclobenzaprine (FLEXERIL) 10 MG tablet Take 1 tablet (10 mg total) by mouth 2 (two) times daily as needed for muscle spasms. 11/27/20   Jessy Oto, MD  dicyclomine (BENTYL) 20 MG tablet Take 1 tablet (20 mg total) by mouth 2 (two) times daily. 04/20/21   Henderly, Britni A, PA-C  escitalopram (LEXAPRO) 20 MG tablet Take 20 mg by mouth daily. 08/10/20   [provider]  fluticasone (FLONASE) 50 MCG/ACT nasal spray Place 1 spray into both nostrils daily as needed for allergies.    [provider]  HYDROcodone-acetaminophen (NORCO/VICODIN) 5-325 MG tablet Take 1 tablet by mouth every 6 (six) hours as needed for moderate pain. 09/14/21   Mariel Aloe, MD  insulin glargine (LANTUS) 100 unit/mL SOPN Inject 60 Units into the skin daily at 2 PM.    [provider]  loperamide (IMODIUM) 2 MG capsule Take 1 capsule (2 mg total) by mouth 4 (four) times daily as needed for diarrhea or loose stools. 09/09/21   Hayden Rasmussen, MD  metFORMIN (GLUCOPHAGE) 1000 MG tablet Take 1,000 mg by mouth 2 (two) times daily. 03/26/21   [provider]  metoprolol succinate (TOPROL-XL) 50  MG 24 hr tablet Take 50 mg by mouth daily. 07/21/14   [provider]  Multiple Vitamins-Minerals (CENTRUM PO) Take 1 tablet by mouth daily.    [provider]  ondansetron (ZOFRAN ODT) 4 MG disintegrating tablet Take 1 tablet (4 mg total) by mouth every 8 (eight) hours as needed for nausea or vomiting. 09/09/21   Hayden Rasmussen, MD  ondansetron (ZOFRAN) 4 MG tablet Take 1 tablet (4 mg total) by mouth every 6 (six) hours. 10/15/21   Valarie Merino, MD  pantoprazole (PROTONIX) 20 MG tablet Take 1 tablet (20 mg total) by mouth daily. 06/22/21 09/13/21  Loni Beckwith, PA-C  simvastatin (ZOCOR) 20 MG tablet Take 20 mg by mouth daily. 08/22/20   [provider]  promethazine (PHENERGAN) 25 MG tablet Take 1 tablet (25 mg total) by mouth every 6 (six) hours as needed for nausea or vomiting. Patient not taking: No sig reported 08/15/20  10/19/20  Janith Lima, PA-C    Physical Exam: Vitals:   10/17/21 1815 10/17/21 1900 10/17/21 2000 10/17/21 2148  BP: (!) 180/99 (!) 189/118 (!) 174/94 (!) 177/98  Pulse: 90 91 93 76  Resp: (!) 23 (!) 23 (!) 29 (!) 26  Temp:      TempSrc:      SpO2: 92% 93% 93% 92%  Weight:      Height:       Physical Exam Constitutional:      General: She is not in acute distress.    Appearance: Normal appearance. She is obese.  HENT:     Head: Normocephalic and atraumatic.     Mouth/Throat:     Mouth: Mucous membranes are moist.     Pharynx: Oropharynx is clear.  Eyes:     Extraocular Movements: Extraocular movements intact.     Pupils: Pupils are equal, round, and reactive to light.  Cardiovascular:     Rate and Rhythm: Normal rate and regular rhythm.     Pulses: Normal pulses.     Heart sounds: Normal heart sounds.  Pulmonary:     Effort: Pulmonary effort is normal. No respiratory distress.     Breath sounds: Normal breath sounds.  Abdominal:     General: Bowel sounds are normal. There is no distension.     Palpations: Abdomen  is soft.     Tenderness: There is generalized abdominal tenderness.  Musculoskeletal:        General: No swelling or deformity.  Skin:    General: Skin is warm and dry.  Neurological:     General: No focal deficit present.     Mental Status: Mental status is at baseline.    Labs on Admission: I have personally reviewed following labs and imaging studies  CBC: Recent Labs  Lab 10/15/21 1100 10/16/21 0000 10/17/21 1259  WBC 11.3* 10.0 10.8*  NEUTROABS 7.0  --  8.0*  HGB 13.5 12.9 13.9  HCT 40.4 39.3 41.4  MCV 83.5 85.2 84.0  PLT 361 376 030    Basic Metabolic Panel: Recent Labs  Lab 10/15/21 1100 10/16/21 0000 10/17/21 1259  NA 136 138 138  K 4.8 4.2 3.7  CL 103 100 102  CO2 '24 25 25  '$ GLUCOSE 245* 378* 279*  BUN '9 12 8  '$ CREATININE 0.56 0.70 0.51  CALCIUM 8.8* 9.3 9.3    GFR: Estimated Creatinine Clearance: 88.8 mL/min (by C-G formula based on SCr of 0.51 mg/dL).  Liver Function Tests: Recent Labs  Lab 10/15/21 1100 10/16/21 0000 10/17/21 1259  AST 36 14 20  ALT '19 18 24  '$ ALKPHOS 102  --  122  BILITOT 1.9* 0.4 0.5  PROT 7.6 6.5 8.2*  ALBUMIN 4.2  --  4.6    Urine analysis:    Component Value Date/Time   COLORURINE STRAW (A) 10/17/2021 1523   APPEARANCEUR CLEAR 10/17/2021 1523   LABSPEC 1.016 10/17/2021 1523   PHURINE 9.0 (H) 10/17/2021 1523   GLUCOSEU >=500 (A) 10/17/2021 1523   HGBUR NEGATIVE 10/17/2021 1523   BILIRUBINUR NEGATIVE 10/17/2021 1523   KETONESUR 20 (A) 10/17/2021 1523   PROTEINUR NEGATIVE 10/17/2021 1523   UROBILINOGEN 0.2 06/26/2015 1300   NITRITE NEGATIVE 10/17/2021 1523   LEUKOCYTESUR NEGATIVE 10/17/2021 1523    Radiological Exams on Admission: CT ABDOMEN PELVIS W CONTRAST  Result Date: 10/17/2021 CLINICAL DATA:  Severe abdominal pain beginning Monday, history of pancreatitis, diabetes. Nausea, vomiting common diarrhea EXAM: CT ABDOMEN AND PELVIS WITH  CONTRAST TECHNIQUE: Multidetector CT imaging of the abdomen and pelvis was  performed using the standard protocol following bolus administration of intravenous contrast. RADIATION DOSE REDUCTION: This exam was performed according to the departmental dose-optimization program which includes automated exposure control, adjustment of the mA and/or kV according to patient size and/or use of iterative reconstruction technique. CONTRAST:  184m OMNIPAQUE IOHEXOL 300 MG/ML  SOLN COMPARISON:  CT abdomen/pelvis 10/15/2021 FINDINGS: Lower chest: The lung bases are clear. The imaged heart is unremarkable. Hepatobiliary: The liver is unremarkable. The gallbladder is surgically absent. There is no biliary ductal dilatation. Pancreas: Unremarkable. Spleen: Unremarkable. Adrenals/Urinary Tract: Left adrenal nodules are again seen, unchanged since at least 2020, likely benign adenomas. The right adrenal is unremarkable. The kidneys are unremarkable, with no focal lesion, stone, hydronephrosis, or hydroureter. The bladder is unremarkable. There is symmetric excretion of contrast into the collecting systems on the delayed images. Stomach/Bowel: The stomach is unremarkable. There is no evidence of bowel obstruction. There is no abnormal bowel wall thickening or inflammatory change. The appendix is normal. Vascular/Lymphatic: There is scattered mild calcified atherosclerotic plaque in the nonaneurysmal abdominal aorta. The major branch vessels are patent. The main portal and splenic veins are patent. There is no abdominal or pelvic lymphadenopathy. Reproductive: The uterus is surgically absent. There is no adnexal mass. Other: There is no ascites or free air. A small fat containing ventral abdominal hernia is unchanged. A 9 mm ovoid hypodense lesion in the left anterior abdominal wall subcutaneous fat may reflect a small sebaceous cyst. Musculoskeletal: There is no acute osseous abnormality or aggressive osseous lesion. Postsurgical changes are again noted at L4-L5 and L5-S1. IMPRESSION: No acute findings in  the abdomen or pelvis. No significant change since 10/15/2021. Electronically Signed   By: PValetta MoleM.D.   On: 10/17/2021 16:11   UKoreaAbdomen Limited RUQ (LIVER/GB)  Result Date: 10/17/2021 CLINICAL DATA:  Right upper quadrant pain. EXAM: ULTRASOUND ABDOMEN LIMITED RIGHT UPPER QUADRANT COMPARISON:  CT abdomen and pelvis 10/2021. ultrasound abdomen 06/27/2008. FINDINGS: Gallbladder: Surgically absent. Common bile duct: Diameter: 5.7 mm. Liver: No focal lesion identified. Increase in parenchymal echogenicity. Portal vein is patent on color Doppler imaging with normal direction of blood flow towards the liver. Other: None. IMPRESSION: Cholecystectomy. Echogenic liver likely related to fatty infiltration. Electronically Signed   By: ARonney AstersM.D.   On: 10/17/2021 17:29    EKG: Independently reviewed.  Sinus rhythm at 90 bpm.  Borderline QTc at 493.  Assessment/Plan Principal Problem:   Intractable nausea and vomiting Active Problems:   Essential hypertension   Diabetes mellitus type 2 in obese (HCC)   Marijuana abuse   Anxiety   Intractable nausea vomiting Abdominal pain > Patient presenting with recurrent episodes of intractable nausea and vomiting with abdominal pain. > In the past, concern for marijuana hyperemesis syndrome.  Does have history of recurrent pancreatitis as well lipase normal MCV 90s at this time.   > Has had unrevealing gastric injury given study, endoscopy, colonoscopy. > Evaluation by GI MD GI planning for combination of IVF and side effect from history of abdominal surgery.  Possibility of intermittent SBO differential includes but not showing up on her imaging.  - Monitor on telemetry and daily EKG considering borderline QTc - Continue with as needed antiemetics - Continue with hydrocodone, and as needed Dilaudid for breakthrough pain, monitoring for constipation as this may worsen abdominal pain - Trend daily labs - IV fluids - Clear liquid diet - Continue home  Bentyl, loperamide  Diabetes > Elevated glucose of 279 in the ED.  Has not yet had evening insulin which she takes 60 units in the evening > No evidence of DKA. - Continue with insulin at 30 units in the evening considering patient has had minimal p.o. intake - SSI  Hypertension - Continue home amlodipine-olmesartan combination pill - Continue home metoprolol  Anxiety - Continue home Lexapro and as needed Xanax  GERD - Continue home PPI  DVT prophylaxis: Lovenox Code Status:   Full Family Communication:  None on admission, she states they aware of her hospitalization   Disposition Plan:   Patient is from:  Home  Anticipated DC to:  Home   Anticipated DC date:  1 to 2 days  Anticipated DC barriers: None  Consults called:  None Admission status:  Observation, telemetry  Severity of Illness: The appropriate patient status for this patient is OBSERVATION. Observation status is judged to be reasonable and necessary in order to provide the required intensity of service to ensure the patient's safety. The patient's presenting symptoms, physical exam findings, and initial radiographic and laboratory data in the context of their medical condition is felt to place them at decreased risk for further clinical deterioration. Furthermore, it is anticipated that the patient will be medically stable for discharge from the hospital within 2 midnights of admission.    Marcelyn Bruins MD [facility]  How to contact the [facility] Attending or Consulting provider [place] or covering provider during after hours [place], for this patient?   Check the care team in [facility] and look for a) attending/consulting [facility] provider listed and b) the [facility] team listed Log into [email or web address] and use [place]'s universal password to access. If you do not have the password, please contact the hospital operator. Locate the [facility] provider you are looking for under [facility] and page to a number that you can  be directly reached. If you still have difficulty reaching the provider, please page the [facility] (Director on Call) for the Hospitalists listed on amion for assistance.  10/17/2021, 10:31 PM

## 2021-10-17 NOTE — ED Notes (Signed)
I was unsuccessful at pulling the second troponin. Asking the nurse to make the second attempt.  ?

## 2021-10-17 NOTE — ED Triage Notes (Signed)
Pt BIB [facility] from home. Patient c/o severe abdominal pain beginning Monday. Patient hx pancreatitis, diabetes. Patient was seen by her PCP and was prescribed antibiotics. Some N/V/D. EMS gave 4 of zofran. Patient has not had insulin today. ? ?Vital signs were:  ? ?151/83 ?86-HR ?22-RR ?100% RA ?327- CBG ? ?

## 2021-10-18 DIAGNOSIS — Z981 Arthrodesis status: Secondary | ICD-10-CM | POA: Diagnosis not present

## 2021-10-18 DIAGNOSIS — Z8249 Family history of ischemic heart disease and other diseases of the circulatory system: Secondary | ICD-10-CM | POA: Diagnosis not present

## 2021-10-18 DIAGNOSIS — R112 Nausea with vomiting, unspecified: Secondary | ICD-10-CM | POA: Diagnosis not present

## 2021-10-18 DIAGNOSIS — E669 Obesity, unspecified: Secondary | ICD-10-CM | POA: Diagnosis not present

## 2021-10-18 DIAGNOSIS — Z794 Long term (current) use of insulin: Secondary | ICD-10-CM | POA: Diagnosis not present

## 2021-10-18 DIAGNOSIS — M797 Fibromyalgia: Secondary | ICD-10-CM | POA: Diagnosis not present

## 2021-10-18 DIAGNOSIS — Z90710 Acquired absence of both cervix and uterus: Secondary | ICD-10-CM | POA: Diagnosis not present

## 2021-10-18 DIAGNOSIS — F17210 Nicotine dependence, cigarettes, uncomplicated: Secondary | ICD-10-CM | POA: Diagnosis not present

## 2021-10-18 DIAGNOSIS — F419 Anxiety disorder, unspecified: Secondary | ICD-10-CM | POA: Diagnosis not present

## 2021-10-18 DIAGNOSIS — Z833 Family history of diabetes mellitus: Secondary | ICD-10-CM | POA: Diagnosis not present

## 2021-10-18 DIAGNOSIS — K219 Gastro-esophageal reflux disease without esophagitis: Secondary | ICD-10-CM | POA: Diagnosis not present

## 2021-10-18 DIAGNOSIS — G8929 Other chronic pain: Secondary | ICD-10-CM | POA: Diagnosis not present

## 2021-10-18 DIAGNOSIS — Z9049 Acquired absence of other specified parts of digestive tract: Secondary | ICD-10-CM | POA: Diagnosis not present

## 2021-10-18 DIAGNOSIS — Z79899 Other long term (current) drug therapy: Secondary | ICD-10-CM | POA: Diagnosis not present

## 2021-10-18 DIAGNOSIS — K760 Fatty (change of) liver, not elsewhere classified: Secondary | ICD-10-CM | POA: Diagnosis not present

## 2021-10-18 DIAGNOSIS — R1013 Epigastric pain: Secondary | ICD-10-CM | POA: Diagnosis present

## 2021-10-18 DIAGNOSIS — Z7982 Long term (current) use of aspirin: Secondary | ICD-10-CM | POA: Diagnosis not present

## 2021-10-18 DIAGNOSIS — E1165 Type 2 diabetes mellitus with hyperglycemia: Secondary | ICD-10-CM | POA: Diagnosis not present

## 2021-10-18 DIAGNOSIS — Z8616 Personal history of COVID-19: Secondary | ICD-10-CM | POA: Diagnosis not present

## 2021-10-18 DIAGNOSIS — F1210 Cannabis abuse, uncomplicated: Secondary | ICD-10-CM | POA: Diagnosis not present

## 2021-10-18 LAB — CBC
HCT: 40.7 % (ref 36.0–46.0)
Hemoglobin: 13.8 g/dL (ref 12.0–15.0)
MCH: 28.5 pg (ref 26.0–34.0)
MCHC: 33.9 g/dL (ref 30.0–36.0)
MCV: 83.9 fL (ref 80.0–100.0)
Platelets: 365 10*3/uL (ref 150–400)
RBC: 4.85 MIL/uL (ref 3.87–5.11)
RDW: 13.6 % (ref 11.5–15.5)
WBC: 14.3 10*3/uL — ABNORMAL HIGH (ref 4.0–10.5)
nRBC: 0 % (ref 0.0–0.2)

## 2021-10-18 LAB — COMPREHENSIVE METABOLIC PANEL
ALT: 20 U/L (ref 0–44)
AST: 15 U/L (ref 15–41)
Albumin: 4.1 g/dL (ref 3.5–5.0)
Alkaline Phosphatase: 101 U/L (ref 38–126)
Anion gap: 9 (ref 5–15)
BUN: 11 mg/dL (ref 6–20)
CO2: 26 mmol/L (ref 22–32)
Calcium: 8.8 mg/dL — ABNORMAL LOW (ref 8.9–10.3)
Chloride: 98 mmol/L (ref 98–111)
Creatinine, Ser: 0.52 mg/dL (ref 0.44–1.00)
GFR, Estimated: >60 mL/min (ref >60)
Glucose, Bld: 277 mg/dL — ABNORMAL HIGH (ref 70–99)
Potassium: 3.9 mmol/L (ref 3.5–5.1)
Sodium: 133 mmol/L — ABNORMAL LOW (ref 135–145)
Total Bilirubin: 0.5 mg/dL (ref 0.3–1.2)
Total Protein: 7.4 g/dL (ref 6.5–8.1)

## 2021-10-18 LAB — RESP PANEL BY RT-PCR (FLU A&B, COVID) ARPGX2
Influenza A by PCR: NEGATIVE
Influenza B by PCR: NEGATIVE
SARS Coronavirus 2 by RT PCR: NEGATIVE

## 2021-10-18 LAB — GLUCOSE, CAPILLARY
Glucose-Capillary: 160 mg/dL — ABNORMAL HIGH (ref 70–99)
Glucose-Capillary: 122 mg/dL — ABNORMAL HIGH (ref 70–99)
Glucose-Capillary: 220 mg/dL — ABNORMAL HIGH (ref 70–99)

## 2021-10-18 LAB — CBG MONITORING, ED
Glucose-Capillary: 240 mg/dL — ABNORMAL HIGH (ref 70–99)
Glucose-Capillary: 188 mg/dL — ABNORMAL HIGH (ref 70–99)

## 2021-10-18 MED ORDER — FLUCONAZOLE 150 MG PO TABS
150.0000 mg | ORAL_TABLET | Freq: Once | ORAL | Status: AC
  Administered 2021-10-18: 18:00:00 150 mg via ORAL
  Filled 2021-10-18: qty 1

## 2021-10-18 MED ORDER — LIVING WELL WITH DIABETES BOOK
Freq: Once | Status: AC
  Filled 2021-10-18: qty 1

## 2021-10-18 NOTE — Progress Notes (Addendum)
Inpatient Diabetes Program Recommendations ? ?AACE/ADA: New Consensus Statement on Inpatient Glycemic Control (2015) ? ?Target Ranges:  Prepandial:   less than 140 mg/dL ?     Peak postprandial:   less than 180 mg/dL (1-2 hours) ?     Critically ill patients:  140 - 180 mg/dL  ? ?Lab Results  ?Component Value Date  ? GLUCAP 240 (H) 10/18/2021  ? HGBA1C 9.9 (H) 09/12/2021  ? ? ?Review of Glycemic Control ? Latest Reference Range & Units 10/17/21 20:58 10/18/21 08:06  ?Glucose-Capillary 70 - 99 mg/dL 331 (H) 240 (H)  ?(H): Data is abnormally high ? ?Diabetes history: DM2 ?Outpatient Diabetes medications: Lantus 60 units QD, Metformin 1000 mg BID ?Current orders for Inpatient glycemic control: Semglee 30 units QHS, Novolog 0-15 units TID ? ?Spoke with patient at bedside.  Reviewed patient's current A1c of 9.6% (average glucose of 237 mg/dL). Explained what a A1c is and what it measures. Also reviewed goal A1c with patient, importance of good glucose control @ home, and blood sugar goals.  She denies skipping doses and difficulty obtaining medications.  She states she needs assistance with diet education.  Discussed The Plate Method, CHO's and importance of portion sizes.  Will add RD consult and order LWWD booklet. ? ?Her Freestyle Elenor Legato was removed for MRI.  Will provide her with a replacement for DC.  Told her she can also call the company anytime it falls out or fails for a free replacement.   ? ?Will continue to follow while inpatient. ? ?Thank you, ?Reche Dixon, MSN, RN ?Diabetes Coordinator ?Inpatient Diabetes Program ?[phone] (team pager from 8a-5p) ? ? ? ? ? ? ?

## 2021-10-18 NOTE — Progress Notes (Signed)
PROGRESS NOTE  [name] SXJ:[id number] DOB: 1965/08/23 DOA: 10/17/2021 PCP: Nolene Ebbs, MD  HPI/Recap of past 24 hours: [name] is a 56 y.o. female with medical history significant of cervical spinal fusion, marijuana use, intractable nausea and vomiting, hypertension, type II diabetes, obesity, chronic pain, anxiety, GERD, pancreatitis presenting with abdominal pain and recurrent intractable nausea vomiting.  Work-up revealed CT abdomen pelvis without acute abnormality ultrasound right upper quadrant during patient status post cholecystectomy and echogenicity of the liver suspicious for fatty infiltration.  Patient received fentanyl, morphine, GI cocktail, Zofran, viscous lidocaine, fluids in the ED.  No clear explanation for her symptomatology.  Ongoing supportive care.  10/18/21: Patient was seen and examined at bedside.  Mainly complains of epigastric pain.  Lipase negative x2.  Started on a clear liquid diet, on Bentyl and Protonix, if no improvement of her symptomatology will consult GI in the morning.  Assessment/Plan: Principal Problem:   Intractable nausea and vomiting Active Problems:   Essential hypertension   Diabetes mellitus type 2 in obese (HCC)   Marijuana abuse   Anxiety  Intractable nausea vomiting Abdominal pain, persistent Lipase negative x2. Continue Bentyl and Protonix Continue supportive care Started on clear liquid diet, advance as tolerated. IV antiemetics as needed   Diabetes type II with hyperglycemia Last hemoglobin A1c 9.9 from 09/12/2021. Continue insulin coverage. Avoid hypoglycemia  Hypertension BP stable Continue home regimen - Continue home amlodipine-olmesartan combination pill - Continue home metoprolol  Anxiety - Continue home Lexapro and as needed Xanax  GERD - Continue home PPI    Code Status:              Full code.   Family Communication:   None at bedside.   Disposition Plan:              Patient is  from:                        Home             Anticipated DC to:                   Home              Anticipated DC date:               1 to 2 days             Anticipated DC barriers:         None     Consults called:        None      DVT prophylaxis: Subcu enoxaparin.  Status is: Inpatient Patient requires at least 2 midnights for further evaluation and treatment of present condition.    Objective: Vitals:   10/18/21 0830 10/18/21 1100 10/18/21 1200 10/18/21 1304  BP: 117/60 123/82 (!) 142/87 136/80  Pulse: 84 69 63 78  Resp: '16 13 15 19  '$ Temp:   97.9 F (36.6 C) 98 F (36.7 C)  TempSrc:   Oral Oral  SpO2: 94% 96% 95% 99%  Weight:      Height:       No intake or output data in the 24 hours ending 10/18/21 1428 Filed Weights   10/17/21 1303  Weight: 97 kg    Exam:  General: 56 y.o. year-old female well developed well nourished in no acute distress.  Alert and oriented x3. Cardiovascular: Regular rate and rhythm with no  rubs or gallops.  No thyromegaly or JVD noted.   Respiratory: Clear to auscultation with no wheezes or rales. Good inspiratory effort. Abdomen: Soft epigastric tenderness with palpation nondistended with normal bowel sounds x4 quadrants. Musculoskeletal: No lower extremity edema. Skin: No ulcerative lesions noted or rashes, Psychiatry: Mood is appropriate for condition and setting Neuro: Moves all 4 extremities.   Data Reviewed: CBC: Recent Labs  Lab 10/15/21 1100 10/16/21 0000 10/17/21 1259 10/18/21 0544  WBC 11.3* 10.0 10.8* 14.3*  NEUTROABS 7.0  --  8.0*  --   HGB 13.5 12.9 13.9 13.8  HCT 40.4 39.3 41.4 40.7  MCV 83.5 85.2 84.0 83.9  PLT 361 376 359 400   Basic Metabolic Panel: Recent Labs  Lab 10/15/21 1100 10/16/21 0000 10/17/21 1259 10/18/21 0544  NA 136 138 138 133*  K 4.8 4.2 3.7 3.9  CL 103 100 102 98  CO2 '24 25 25 26  '$ GLUCOSE 245* 378* 279* 277*  BUN '9 12 8 11  '$ CREATININE 0.56 0.70 0.51 0.52  CALCIUM 8.8* 9.3 9.3  8.8*   GFR: Estimated Creatinine Clearance: 88.8 mL/min (by C-G formula based on SCr of 0.52 mg/dL). Liver Function Tests: Recent Labs  Lab 10/15/21 1100 10/16/21 0000 10/17/21 1259 10/18/21 0544  AST 36 '14 20 15  '$ ALT '19 18 24 20  '$ ALKPHOS 102  --  122 101  BILITOT 1.9* 0.4 0.5 0.5  PROT 7.6 6.5 8.2* 7.4  ALBUMIN 4.2  --  4.6 4.1   Recent Labs  Lab 10/15/21 1100 10/17/21 1259  LIPASE 39 38   No results for input(s): AMMONIA in the last 168 hours. Coagulation Profile: No results for input(s): INR, PROTIME in the last 168 hours. Cardiac Enzymes: No results for input(s): CKTOTAL, CKMB, CKMBINDEX, TROPONINI in the last 168 hours. BNP (last 3 results) No results for input(s): PROBNP in the last 8760 hours. HbA1C: No results for input(s): HGBA1C in the last 72 hours. CBG: Recent Labs  Lab 10/17/21 2058 10/18/21 0806 10/18/21 1151 10/18/21 1319  GLUCAP 331* 240* 188* 160*   Lipid Profile: Recent Labs    10/16/21 0000  CHOL 145  HDL 41*  LDLCALC 77  TRIG 169*  CHOLHDL 3.5   Thyroid Function Tests: Recent Labs    10/16/21 0000  TSH 0.38*   Anemia Panel: No results for input(s): VITAMINB12, FOLATE, FERRITIN, TIBC, IRON, RETICCTPCT in the last 72 hours. Urine analysis:    Component Value Date/Time   COLORURINE STRAW (A) 10/17/2021 1523   APPEARANCEUR CLEAR 10/17/2021 1523   LABSPEC 1.016 10/17/2021 1523   PHURINE 9.0 (H) 10/17/2021 1523   GLUCOSEU >=500 (A) 10/17/2021 1523   HGBUR NEGATIVE 10/17/2021 1523   BILIRUBINUR NEGATIVE 10/17/2021 1523   KETONESUR 20 (A) 10/17/2021 1523   PROTEINUR NEGATIVE 10/17/2021 1523   UROBILINOGEN 0.2 06/26/2015 1300   NITRITE NEGATIVE 10/17/2021 1523   LEUKOCYTESUR NEGATIVE 10/17/2021 1523   Sepsis Labs: '@LABRCNTIP'$ (procalcitonin:4,lacticidven:4)  ) Recent Results (from the past 240 hour(s))  Resp Panel by RT-PCR (Flu A&B, Covid) Nasopharyngeal Swab     Status: None   Collection Time: 10/17/21 11:14 PM   Specimen:  Nasopharyngeal Swab; Nasopharyngeal(NP) swabs in vial transport medium  Result Value Ref Range Status   SARS Coronavirus 2 by RT PCR NEGATIVE NEGATIVE Final    Comment: (NOTE) SARS-CoV-2 target nucleic acids are NOT DETECTED.  The SARS-CoV-2 RNA is generally detectable in upper respiratory specimens during the acute phase of infection. The lowest concentration of SARS-CoV-2 viral copies  this assay can detect is 138 copies/mL. A negative result does not preclude SARS-Cov-2 infection and should not be used as the sole basis for treatment or other patient management decisions. A negative result may occur with  improper specimen collection/handling, submission of specimen other than nasopharyngeal swab, presence of viral mutation(s) within the areas targeted by this assay, and inadequate number of viral copies(<138 copies/mL). A negative result must be combined with clinical observations, patient history, and epidemiological information. The expected result is Negative.  Fact Sheet for Patients:  EntrepreneurPulse.com.au  Fact Sheet for Healthcare Providers:  IncredibleEmployment.be  This test is no t yet approved or cleared by the [place] FDA and  has been authorized for detection and/or diagnosis of SARS-CoV-2 by FDA under an Emergency Use Authorization (EUA). This EUA will remain  in effect (meaning this test can be used) for the duration of the COVID-19 declaration under Section 564(b)(1) of the Act, 21 U.S.C.section 360bbb-3(b)(1), unless the authorization is terminated  or revoked sooner.       Influenza A by PCR NEGATIVE NEGATIVE Final   Influenza B by PCR NEGATIVE NEGATIVE Final    Comment: (NOTE) The Xpert Xpress SARS-CoV-2/FLU/RSV plus assay is intended as an aid in the diagnosis of influenza from Nasopharyngeal swab specimens and should not be used as a sole basis for treatment. Nasal washings and aspirates are unacceptable for  Xpert Xpress SARS-CoV-2/FLU/RSV testing.  Fact Sheet for Patients: EntrepreneurPulse.com.au  Fact Sheet for Healthcare Providers: IncredibleEmployment.be  This test is not yet approved or cleared by the [place] FDA and has been authorized for detection and/or diagnosis of SARS-CoV-2 by FDA under an Emergency Use Authorization (EUA). This EUA will remain in effect (meaning this test can be used) for the duration of the COVID-19 declaration under Section 564(b)(1) of the Act, 21 U.S.C. section 360bbb-3(b)(1), unless the authorization is terminated or revoked.  Performed at [facility], [place] [address], [place], [place] [address]       Studies: CT ABDOMEN PELVIS W CONTRAST  Result Date: 10/17/2021 CLINICAL DATA:  Severe abdominal pain beginning Monday, history of pancreatitis, diabetes. Nausea, vomiting common diarrhea EXAM: CT ABDOMEN AND PELVIS WITH CONTRAST TECHNIQUE: Multidetector CT imaging of the abdomen and pelvis was performed using the standard protocol following bolus administration of intravenous contrast. RADIATION DOSE REDUCTION: This exam was performed according to the departmental dose-optimization program which includes automated exposure control, adjustment of the mA and/or kV according to patient size and/or use of iterative reconstruction technique. CONTRAST:  159m OMNIPAQUE IOHEXOL 300 MG/ML  SOLN COMPARISON:  CT abdomen/pelvis 10/15/2021 FINDINGS: Lower chest: The lung bases are clear. The imaged heart is unremarkable. Hepatobiliary: The liver is unremarkable. The gallbladder is surgically absent. There is no biliary ductal dilatation. Pancreas: Unremarkable. Spleen: Unremarkable. Adrenals/Urinary Tract: Left adrenal nodules are again seen, unchanged since at least 2020, likely benign adenomas. The right adrenal is unremarkable. The kidneys are unremarkable, with no focal lesion, stone, hydronephrosis, or  hydroureter. The bladder is unremarkable. There is symmetric excretion of contrast into the collecting systems on the delayed images. Stomach/Bowel: The stomach is unremarkable. There is no evidence of bowel obstruction. There is no abnormal bowel wall thickening or inflammatory change. The appendix is normal. Vascular/Lymphatic: There is scattered mild calcified atherosclerotic plaque in the nonaneurysmal abdominal aorta. The major branch vessels are patent. The main portal and splenic veins are patent. There is no abdominal or pelvic lymphadenopathy. Reproductive: The uterus is surgically absent. There is no adnexal  mass. Other: There is no ascites or free air. A small fat containing ventral abdominal hernia is unchanged. A 9 mm ovoid hypodense lesion in the left anterior abdominal wall subcutaneous fat may reflect a small sebaceous cyst. Musculoskeletal: There is no acute osseous abnormality or aggressive osseous lesion. Postsurgical changes are again noted at L4-L5 and L5-S1. IMPRESSION: No acute findings in the abdomen or pelvis. No significant change since 10/15/2021. Electronically Signed   By: Valetta Mole M.D.   On: 10/17/2021 16:11   US Abdomen Limited RUQ (LIVER/GB)  Result Date: 10/17/2021 CLINICAL DATA:  Right upper quadrant pain. EXAM: ULTRASOUND ABDOMEN LIMITED RIGHT UPPER QUADRANT COMPARISON:  CT abdomen and pelvis 10/2021. ultrasound abdomen 06/27/2008. FINDINGS: Gallbladder: Surgically absent. Common bile duct: Diameter: 5.7 mm. Liver: No focal lesion identified. Increase in parenchymal echogenicity. Portal vein is patent on color Doppler imaging with normal direction of blood flow towards the liver. Other: None. IMPRESSION: Cholecystectomy. Echogenic liver likely related to fatty infiltration. Electronically Signed   By: Ronney Asters M.D.   On: 10/17/2021 17:29    Scheduled Meds:  alum & mag hydroxide-simeth  30 mL Oral Once   And   lidocaine  15 mL Oral Once   amLODipine  5 mg Oral  Daily   And   irbesartan  300 mg Oral Daily   aspirin EC  81 mg Oral Daily   dicyclomine  20 mg Oral BID   enoxaparin (LOVENOX) injection  40 mg Subcutaneous Q24H   escitalopram  20 mg Oral Daily   insulin aspart  0-15 Units Subcutaneous TID WC   insulin glargine-yfgn  30 Units Subcutaneous QHS   living well with diabetes book   Does not apply Once   metoprolol succinate  50 mg Oral Daily   pantoprazole  20 mg Oral Daily   simvastatin  20 mg Oral Daily   sodium chloride flush  3 mL Intravenous Q12H    Continuous Infusions:   LOS: 0 days     Kayleen Memos, MD [facility] Pager [phone]  If 7PM-7AM, please contact night-coverage [email or web address] Password [email or web address] 10/18/2021, 2:28 PM

## 2021-10-19 LAB — COMPREHENSIVE METABOLIC PANEL
ALT: 16 U/L (ref 0–44)
AST: 17 U/L (ref 15–41)
Albumin: 3.7 g/dL (ref 3.5–5.0)
Alkaline Phosphatase: 84 U/L (ref 38–126)
Anion gap: 8 (ref 5–15)
BUN: 14 mg/dL (ref 6–20)
CO2: 27 mmol/L (ref 22–32)
Calcium: 8.6 mg/dL — ABNORMAL LOW (ref 8.9–10.3)
Chloride: 99 mmol/L (ref 98–111)
Creatinine, Ser: 0.68 mg/dL (ref 0.44–1.00)
GFR, Estimated: >60 mL/min (ref >60)
Glucose, Bld: 212 mg/dL — ABNORMAL HIGH (ref 70–99)
Potassium: 3.9 mmol/L (ref 3.5–5.1)
Sodium: 134 mmol/L — ABNORMAL LOW (ref 135–145)
Total Bilirubin: 0.6 mg/dL (ref 0.3–1.2)
Total Protein: 6.8 g/dL (ref 6.5–8.1)

## 2021-10-19 LAB — CBC WITH DIFFERENTIAL/PLATELET
Abs Immature Granulocytes: 0.05 10*3/uL (ref 0.00–0.07)
Basophils Absolute: 0 10*3/uL (ref 0.0–0.1)
Basophils Relative: 0 %
Eosinophils Absolute: 0.1 10*3/uL (ref 0.0–0.5)
Eosinophils Relative: 1 %
HCT: 42.1 % (ref 36.0–46.0)
Hemoglobin: 14 g/dL (ref 12.0–15.0)
Immature Granulocytes: 0 %
Lymphocytes Relative: 36 %
Lymphs Abs: 4 10*3/uL (ref 0.7–4.0)
MCH: 28.2 pg (ref 26.0–34.0)
MCHC: 33.3 g/dL (ref 30.0–36.0)
MCV: 84.7 fL (ref 80.0–100.0)
Monocytes Absolute: 0.7 10*3/uL (ref 0.1–1.0)
Monocytes Relative: 6 %
Neutro Abs: 6.3 10*3/uL (ref 1.7–7.7)
Neutrophils Relative %: 57 %
Platelets: 353 10*3/uL (ref 150–400)
RBC: 4.97 MIL/uL (ref 3.87–5.11)
RDW: 13.6 % (ref 11.5–15.5)
WBC: 11.2 10*3/uL — ABNORMAL HIGH (ref 4.0–10.5)
nRBC: 0 % (ref 0.0–0.2)

## 2021-10-19 LAB — MAGNESIUM: Magnesium: 2 mg/dL (ref 1.7–2.4)

## 2021-10-19 LAB — GLUCOSE, CAPILLARY
Glucose-Capillary: 205 mg/dL — ABNORMAL HIGH (ref 70–99)
Glucose-Capillary: 219 mg/dL — ABNORMAL HIGH (ref 70–99)
Glucose-Capillary: 279 mg/dL — ABNORMAL HIGH (ref 70–99)
Glucose-Capillary: 236 mg/dL — ABNORMAL HIGH (ref 70–99)

## 2021-10-19 LAB — PHOSPHORUS: Phosphorus: 3.7 mg/dL (ref 2.5–4.6)

## 2021-10-19 MED ORDER — DIPHENHYDRAMINE HCL 25 MG PO CAPS
25.0000 mg | ORAL_CAPSULE | Freq: Three times a day (TID) | ORAL | Status: DC | PRN
  Administered 2021-10-19: 25 mg via ORAL
  Filled 2021-10-19: qty 1

## 2021-10-19 MED ORDER — HYDROMORPHONE HCL 1 MG/ML IJ SOLN
1.0000 mg | INTRAMUSCULAR | Status: DC | PRN
  Administered 2021-10-19 (×3): 1 mg via INTRAVENOUS
  Filled 2021-10-19 (×3): qty 1

## 2021-10-19 MED ORDER — INSULIN GLARGINE-YFGN 100 UNIT/ML ~~LOC~~ SOLN
40.0000 [IU] | Freq: Every day | SUBCUTANEOUS | Status: DC
  Administered 2021-10-19: 40 [IU] via SUBCUTANEOUS
  Filled 2021-10-19 (×2): qty 0.4

## 2021-10-19 MED ORDER — HYDROCODONE-ACETAMINOPHEN 5-325 MG PO TABS
2.0000 | ORAL_TABLET | ORAL | Status: DC | PRN
  Administered 2021-10-19 – 2021-10-20 (×3): 2 via ORAL
  Filled 2021-10-19 (×3): qty 2

## 2021-10-19 MED ORDER — POLYETHYLENE GLYCOL 3350 17 G PO PACK
17.0000 g | PACK | Freq: Every day | ORAL | Status: DC | PRN
  Administered 2021-10-19: 17 g via ORAL
  Filled 2021-10-19: qty 1

## 2021-10-19 MED ORDER — SENNOSIDES-DOCUSATE SODIUM 8.6-50 MG PO TABS
2.0000 | ORAL_TABLET | Freq: Two times a day (BID) | ORAL | Status: DC
  Administered 2021-10-19 – 2021-10-20 (×3): 2 via ORAL
  Filled 2021-10-19 (×3): qty 2

## 2021-10-19 MED ORDER — SULFAMETHOXAZOLE-TRIMETHOPRIM 800-160 MG PO TABS
1.0000 | ORAL_TABLET | Freq: Two times a day (BID) | ORAL | Status: DC
  Administered 2021-10-19 – 2021-10-20 (×3): 1 via ORAL
  Filled 2021-10-19 (×3): qty 1

## 2021-10-19 NOTE — Progress Notes (Signed)
Nutrition Note ? ?RD consulted for nutrition education regarding diabetes.  ? ?Lab Results  ?Component Value Date  ? HGBA1C 9.9 (H) 09/12/2021  ? ? ?RD provided "Carbohydrate Counting for People with Diabetes" handout from the Academy of Nutrition and Dietetics. Discusses different food groups and their effects on blood sugar, emphasizing carbohydrate-containing foods. Provides list of carbohydrates and recommended serving sizes of common foods. ? ?Discusses importance of controlled and consistent carbohydrate intake throughout the day. Provides examples of ways to balance meals/snacks and encouraged intake of high-fiber, whole grain complex carbohydrates.  ? ?Expect fair compliance. Daughter at bedside for support. Pt very upset during RD visit. Was too upset to engage in education at this time. Per DM coordinator note, the Plate Method, CHO foods and portions were discussed 3/9.  ?Pt wanting further support in the form of outpatient follow-up. Will place referral to [facility] given pt's need for further support in changing her diet. Requesting meal planning assistance. Will provide sample menus in discharge instructions.  ? ?Body mass index is 36.56 kg/m?Marland Kitchen Pt meets criteria for obesity based on current BMI. ? ?Current diet order is dysphagia 3, patient is consuming approximately 100% of meals at this time. Labs and medications reviewed. No further nutrition interventions warranted at this time. If additional nutrition issues arise, please re-consult RD. ? ?Lauren Bibles, MS, RD, LDN ?Inpatient Clinical Dietitian ?Contact information available via Amion ? ? ?

## 2021-10-19 NOTE — Progress Notes (Signed)
PROGRESS NOTE  [name] CHE:[id number] DOB: 1966/08/03 DOA: 10/17/2021 PCP: Nolene Ebbs, MD  HPI/Recap of past 24 hours: [name] is a 56 y.o. female with medical history significant of cervical spinal fusion, marijuana use, intractable nausea and vomiting, hypertension, type II diabetes, obesity, chronic abdominal pain, IBS, anxiety, GERD, pancreatitis presented with abdominal pain and recurrent intractable nausea and vomiting.  Work-up revealed CT abdomen pelvis without acute abnormality, normal LFTs, normal lipase level.  Patient received pain control IV fentanyl, morphine, GI cocktail, Zofran, viscous lidocaine, fluids in the ED without improvement of her symptomatology.  No clear explanation for her worsening chronic abdominal pain.  GI consulted.  No plan for further GI testing, started on Bactrim DS 1 tablet twice daily x10 days for possible SIBO.  10/19/21: Patient was seen and examined at her bedside.  Endorses severe abdominal pain not improved with small dose of Dilaudid.  No vomiting reported, tolerating a diet.   Assessment/Plan: Principal Problem:   Intractable nausea and vomiting Active Problems:   Essential hypertension   Diabetes mellitus type 2 in obese [facility])   Marijuana abuse   Anxiety  Refractory chronic abdominal pain.   Abdominal pain, persistent CT scan abdomen and pelvis unrevealing. LFTs normal Lipase negative x2. Suspect exacerbated by chronic opiate use Seen by GI, no plan for further GI testing, started on Bactrim DS 1 tablet twice daily x10 days for possible SIBO. DC IV Dilaudid, avoid use of opiates Will need to follow-up with Dr. Collene Mares or GI at [facility] outpatient.  Diabetes type II with hyperglycemia Last hemoglobin A1c 9.9 from 09/12/2021. Continue insulin coverage. Dose of insulin increased.  Hypertension BP stable Continue home regimen - Continue home amlodipine-olmesartan combination pill - Continue home  metoprolol Continue to monitor vital signs.  Anxiety - Continue home Lexapro and as needed Xanax  GERD - Continue home PPI    Code Status:              Full code.   Family Communication:   None at bedside.   Disposition Plan: Plan to DC on 10/20/2021  Consults called:        None      DVT prophylaxis: Subcu enoxaparin.  Status is: Inpatient Patient requires at least 2 midnights for further evaluation and treatment of present condition.    Objective: Vitals:   10/18/21 2103 10/19/21 0122 10/19/21 0418 10/19/21 1253  BP: 139/74 (!) 147/73 (!) 151/82 (!) 148/73  Pulse: 71 71 64 (!) 59  Resp: '18 18 18 20  '$ Temp: 98.5 F (36.9 C) (!) 97.5 F (36.4 C) 98 F (36.7 C) 97.8 F (36.6 C)  TempSrc: Oral Oral  Oral  SpO2: 98% 90% 98% 95%  Weight:   96.6 kg   Height:        Intake/Output Summary (Last 24 hours) at 10/19/2021 1554 Last data filed at 10/19/2021 0900 Gross per 24 hour  Intake 480 ml  Output --  Net 480 ml   Filed Weights   10/17/21 1303 10/19/21 0418  Weight: 97 kg 96.6 kg    Exam:  General: 56 y.o. year-old female with notable nourished in no acute distress.  She is alert and oriented x3.   Cardiovascular: Regular rate and rhythm no rubs or gallops.   Respiratory: Clear to auscultation no wheezes or rales.   Abdomen: Soft diffusely tender.  Bowel sounds present.   Musculoskeletal: No lower extremity edema bilaterally. Skin: No lesions noted. Psychiatry: Mood is appropriate for  condition. Neuro: Moves all 4 extremities.   Data Reviewed: CBC: Recent Labs  Lab 10/15/21 1100 10/16/21 0000 10/17/21 1259 10/18/21 0544 10/19/21 0554  WBC 11.3* 10.0 10.8* 14.3* 11.2*  NEUTROABS 7.0  --  8.0*  --  6.3  HGB 13.5 12.9 13.9 13.8 14.0  HCT 40.4 39.3 41.4 40.7 42.1  MCV 83.5 85.2 84.0 83.9 84.7  PLT 361 376 359 365 803   Basic Metabolic Panel: Recent Labs  Lab 10/15/21 1100 10/16/21 0000 10/17/21 1259 10/18/21 0544 10/19/21 0554  NA 136  138 138 133* 134*  K 4.8 4.2 3.7 3.9 3.9  CL 103 100 102 98 99  CO2 '24 25 25 26 27  '$ GLUCOSE 245* 378* 279* 277* 212*  BUN '9 12 8 11 14  '$ CREATININE 0.56 0.70 0.51 0.52 0.68  CALCIUM 8.8* 9.3 9.3 8.8* 8.6*  MG  --   --   --   --  2.0  PHOS  --   --   --   --  3.7   GFR: Estimated Creatinine Clearance: 88.6 mL/min (by C-G formula based on SCr of 0.68 mg/dL). Liver Function Tests: Recent Labs  Lab 10/15/21 1100 10/16/21 0000 10/17/21 1259 10/18/21 0544 10/19/21 0554  AST 36 '14 20 15 17  '$ ALT '19 18 24 20 16  '$ ALKPHOS 102  --  122 101 84  BILITOT 1.9* 0.4 0.5 0.5 0.6  PROT 7.6 6.5 8.2* 7.4 6.8  ALBUMIN 4.2  --  4.6 4.1 3.7   Recent Labs  Lab 10/15/21 1100 10/17/21 1259  LIPASE 39 38   No results for input(s): AMMONIA in the last 168 hours. Coagulation Profile: No results for input(s): INR, PROTIME in the last 168 hours. Cardiac Enzymes: No results for input(s): CKTOTAL, CKMB, CKMBINDEX, TROPONINI in the last 168 hours. BNP (last 3 results) No results for input(s): PROBNP in the last 8760 hours. HbA1C: No results for input(s): HGBA1C in the last 72 hours. CBG: Recent Labs  Lab 10/18/21 1319 10/18/21 1744 10/18/21 2103 10/19/21 0745 10/19/21 1202  GLUCAP 160* 122* 220* 205* 219*   Lipid Profile: No results for input(s): CHOL, HDL, LDLCALC, TRIG, CHOLHDL, LDLDIRECT in the last 72 hours.  Thyroid Function Tests: No results for input(s): TSH, T4TOTAL, FREET4, T3FREE, THYROIDAB in the last 72 hours.  Anemia Panel: No results for input(s): VITAMINB12, FOLATE, FERRITIN, TIBC, IRON, RETICCTPCT in the last 72 hours. Urine analysis:    Component Value Date/Time   COLORURINE STRAW (A) 10/17/2021 1523   APPEARANCEUR CLEAR 10/17/2021 1523   LABSPEC 1.016 10/17/2021 1523   PHURINE 9.0 (H) 10/17/2021 1523   GLUCOSEU >=500 (A) 10/17/2021 1523   HGBUR NEGATIVE 10/17/2021 1523   BILIRUBINUR NEGATIVE 10/17/2021 1523   KETONESUR 20 (A) 10/17/2021 1523   PROTEINUR NEGATIVE  10/17/2021 1523   UROBILINOGEN 0.2 06/26/2015 1300   NITRITE NEGATIVE 10/17/2021 1523   LEUKOCYTESUR NEGATIVE 10/17/2021 1523   Sepsis Labs: '@LABRCNTIP'$ (procalcitonin:4,lacticidven:4)  ) Recent Results (from the past 240 hour(s))  Resp Panel by RT-PCR (Flu A&B, Covid) Nasopharyngeal Swab     Status: None   Collection Time: 10/17/21 11:14 PM   Specimen: Nasopharyngeal Swab; Nasopharyngeal(NP) swabs in vial transport medium  Result Value Ref Range Status   SARS Coronavirus 2 by RT PCR NEGATIVE NEGATIVE Final    Comment: (NOTE) SARS-CoV-2 target nucleic acids are NOT DETECTED.  The SARS-CoV-2 RNA is generally detectable in upper respiratory specimens during the acute phase of infection. The lowest concentration of SARS-CoV-2 viral copies this  assay can detect is 138 copies/mL. A negative result does not preclude SARS-Cov-2 infection and should not be used as the sole basis for treatment or other patient management decisions. A negative result may occur with  improper specimen collection/handling, submission of specimen other than nasopharyngeal swab, presence of viral mutation(s) within the areas targeted by this assay, and inadequate number of viral copies(<138 copies/mL). A negative result must be combined with clinical observations, patient history, and epidemiological information. The expected result is Negative.  Fact Sheet for Patients:  EntrepreneurPulse.com.au  Fact Sheet for Healthcare Providers:  IncredibleEmployment.be  This test is no t yet approved or cleared by the [place] FDA and  has been authorized for detection and/or diagnosis of SARS-CoV-2 by FDA under an Emergency Use Authorization (EUA). This EUA will remain  in effect (meaning this test can be used) for the duration of the COVID-19 declaration under Section 564(b)(1) of the Act, 21 U.S.C.section 360bbb-3(b)(1), unless the authorization is terminated  or revoked  sooner.       Influenza A by PCR NEGATIVE NEGATIVE Final   Influenza B by PCR NEGATIVE NEGATIVE Final    Comment: (NOTE) The Xpert Xpress SARS-CoV-2/FLU/RSV plus assay is intended as an aid in the diagnosis of influenza from Nasopharyngeal swab specimens and should not be used as a sole basis for treatment. Nasal washings and aspirates are unacceptable for Xpert Xpress SARS-CoV-2/FLU/RSV testing.  Fact Sheet for Patients: EntrepreneurPulse.com.au  Fact Sheet for Healthcare Providers: IncredibleEmployment.be  This test is not yet approved or cleared by the [place] FDA and has been authorized for detection and/or diagnosis of SARS-CoV-2 by FDA under an Emergency Use Authorization (EUA). This EUA will remain in effect (meaning this test can be used) for the duration of the COVID-19 declaration under Section 564(b)(1) of the Act, 21 U.S.C. section 360bbb-3(b)(1), unless the authorization is terminated or revoked.  Performed at [facility], [place] [address], [place], [place] [address]       Studies: No results found.  Scheduled Meds:  alum & mag hydroxide-simeth  30 mL Oral Once   And   lidocaine  15 mL Oral Once   amLODipine  5 mg Oral Daily   And   irbesartan  300 mg Oral Daily   aspirin EC  81 mg Oral Daily   dicyclomine  20 mg Oral BID   enoxaparin (LOVENOX) injection  40 mg Subcutaneous Q24H   escitalopram  20 mg Oral Daily   insulin aspart  0-15 Units Subcutaneous TID WC   insulin glargine-yfgn  40 Units Subcutaneous QHS   metoprolol succinate  50 mg Oral Daily   pantoprazole  20 mg Oral Daily   senna-docusate  2 tablet Oral BID   simvastatin  20 mg Oral Daily   sodium chloride flush  3 mL Intravenous Q12H   sulfamethoxazole-trimethoprim  1 tablet Oral Q12H    Continuous Infusions:   LOS: 1 day     Kayleen Memos, MD [facility] Pager [phone]  If 7PM-7AM, please contact  night-coverage [email or web address] Password [facility] 10/19/2021, 3:54 PM

## 2021-10-19 NOTE — Progress Notes (Addendum)
Inpatient Diabetes Program Recommendations ? ?AACE/ADA: New Consensus Statement on Inpatient Glycemic Control (2015) ? ?Target Ranges:  Prepandial:   less than 140 mg/dL ?     Peak postprandial:   less than 180 mg/dL (1-2 hours) ?     Critically ill patients:  140 - 180 mg/dL  ? ?Lab Results  ?Component Value Date  ? GLUCAP 219 (H) 10/19/2021  ? HGBA1C 9.9 (H) 09/12/2021  ? ? ?Review of Glycemic Control ? Latest Reference Range & Units 10/19/21 07:45 10/19/21 12:02  ?Glucose-Capillary 70 - 99 mg/dL 205 (H) 219 (H)  ?(H): Data is abnormally high ? ?Diabetes history: DM ?Outpatient Diabetes medications: Lantus 60 units QHS, Metformin 1000 mg BID ?Current orders for Inpatient glycemic control: Semglee 30 units QHS, Novolog 0-15 units TID ? ?Inpatient Diabetes Program Recommendations:   ? ?Semglee 30 units QHS (80% of home dose).  Provided patient with a replacement Freestyle Libre 2 CGM.   ? ?Patient is drinking regular Ginger-Ale.  Explained this will increase her blood glucose.  Please order Carb modified diet. ? ?Will continue to follow while inpatient. ? ?Thank you, ?Reche Dixon, MSN, RN ?Diabetes Coordinator ?Inpatient Diabetes Program ?[phone] (team pager from 8a-5p) ? ? ? ?

## 2021-10-19 NOTE — Consult Note (Addendum)
Consultation Note   Referring Provider: [facility] PCP: Nolene Ebbs, MD Gastroenterologist: Previously Dr Collene Mares but now followed by [facility] - Beaulah Corin, MD Reason for consultation:  Intractable abdominal pain , nausea and vomiting                Assessment and Plan   # 56 yo female with chronic ( 20 year) of intermittent nausea, vomiting, generalized abdominal pain. Symptoms frequently associated with loose stool. Previously evaluated by Dr. Collene Mares but more recently by [facility]. Extensive workup has been unrevealing. She didn't return to [facility] after July 2022 visit for completion of workup due to getting COVID19. They wanted to check her for SIBO. Symptoms could be IBS. SIBO is possible.  No evidence that she has chronic pancreatitis --Will treat empirically for SIBO. --Of note she is s/p cholecystectomy. Her last colonoscopy was ~ 4 years ago with Dr. Collene Mares and reportedly normal.    # History of pancreatitis in 2009, possible mild pancreatitis in 2015, 2016 but no findings on any of the other CT scans over the years for same symptoms and she has had multiple, multiple scans.    # Additional medical history listed below.    History of Present Illness:   Patient Profile:   [name] is a 56 y.o. female with a past medical history significant for  DM2, obesity, pancreatitis, tobacco abuse, THC use, cholecystectomy, chronic nausea / vomiting/ abdominal pain, cervical spinal fusion, anxiety, GERD, HTN.  See [facility] for any additional medical problems.    History relevant to this admission:   [name] has had nausea, vomiting and generalized abdominal pain for nearly  20 years. She was previously followed by Dr. Collene Mares but then self-referred to [facility] in November 2021.  Initial consultation at [facility] reads:   A review of patients chart discloses a history of several local ED presentations for issues including abdominal pain. A  lipase has been checked on many occasions and has often been elevated to non specific degrees (ie, <3x ULN). During one presentation, abdominal CT was performed 05/2015 and suggested faint peripancreatic fat stranding about the head of the pancreas. A repeat scan the following month was normal. Most recently, [name] was admitted to [facility] 10/16 - 10/19 after presenting with a chief complaint of nausea/vomiting with inability to tolerate po (a chronic problem). Abdominal CT was unrevealing. Labs negative for celiac disease. TSH was within reference range. AM Cortisol measured 8. Of note, a prior gastric emptying study in 2020 was normal. The differential at this point would favor cyclic vomiting syndrome versus cannabis hyperemesis syndrome. Therefore, we have recommended complete abstinence from her routine use of marijuana. We will also plan for upper endoscopy to definitively exclude peptic ulcer disease, H. Pylori infection or severe esophagitis/gastritis among other considerations. If EGD is normal, and if symptoms prove refractory, then could consider trial of TCA through evaluation in our general GI clinic. Likely, the patient's cyclical presentation is not consistent with pancreatitis. As such, I do not think the patient has chronic pancreatitis as a cause of her symptom   Her EGD was basically unrevealing.   July 2022 office follow with [facility]. Extensive GI evaluation without clear cause of  symptoms. 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 Scheduled for SIBO testing and CTE.   Interval History:  [name] didn't follow up with [facility] or get further testing done because she got COVID. She presented to ED here on 3/8 with intractable nausea, vomiting and abdominal pain. WBC 10.8, hgb 13.9. LFTS normal. Lipase normal. She describes years of intermittent episodes of abdominal bloating, nausea, vomiting, generalized abdominal pain which occur  several times a month and last for days. She frequently has loose stool during these episodes A few days out of the month she feels normal.  She cannot related symptoms to anything in particular that she is eating. She feels like stress exacerbates symptoms. She still smoke THC a couple of times a week. She hasn't had any significant weight loss associated with symptoms. She doesn't take anything over the counter for symptoms. The only thing that helps is coming into hospital to get IV pain meds.    Previous GI Evaluations:    Previous Endoscopies: [facility] ( [facility])  --Normal esophagus. Localized patch of erythematous mucosa in gastri body, otherwise normal stomach.  Biopsies Gastric biopsy - gastric antral and oxyntic mucosa with no patholgoic diagnosis. No active or chronic gastritis. No H.pylori.  Stomach nodule - fragment of gastric mucosa with minimal changes c/w with a gastric fundic gland polyp. Negative for dysplasia.    Previous Imaging:  *She has had at least 17 CT scans of abdomen since 2009 ( [facility]). Most have been unrevealing except for hepatic steatosis and gastric wall thickening ( has since had EGD). Only one has demonstrated pancreatitis and that was in June 2009 . There was suggestion of possible mild pancreatitis on CT scan in December 2015 and October 2016.   CT March 2022 - [facility] IMPRESSION: 1. No acute findings of the chest, abdomen or pelvis. 2. Moderate cardiomegaly. 3. Atelectasis at the lung bases. Aortic Atherosclerosis  Gastric emptying study May 2020 FINDINGS:  Expected location of the stomach in the left upper quadrant.  Ingested meal empties the stomach gradually over the course of the  study with 38.4% retention at 60 min and 6.1% retention at 120 min  (normal retention less than 30% at a 120 min).  IMPRESSION:  Normal gastric emptying study.   Previous Labs: H.pylori antibody negative Lipase in 500's when she had pancreatitis in 2009.  Other than tha is has been frequentlly elevated (< 2-3 x ULN )   Diagnostic studies this admission   Imaging:    CT ABDOMEN PELVIS W CONTRAST  Result Date: 10/17/2021 CLINICAL DATA:  Severe abdominal pain beginning Monday, history of pancreatitis, diabetes. Nausea, vomiting common diarrhea EXAM: CT ABDOMEN AND PELVIS WITH CONTRAST TECHNIQUE: Multidetector CT imaging of the abdomen and pelvis was performed using the standard protocol following bolus administration of intravenous contrast. RADIATION DOSE REDUCTION: This exam was performed according to the departmental dose-optimization program which includes automated exposure control, adjustment of the mA and/or kV according to patient size and/or use of iterative reconstruction technique. CONTRAST:  110m OMNIPAQUE IOHEXOL 300 MG/ML  SOLN COMPARISON:  CT abdomen/pelvis 10/15/2021 FINDINGS: Lower chest: The lung bases are clear. The imaged heart is unremarkable. Hepatobiliary: The liver is unremarkable. The gallbladder is surgically absent. There is no biliary ductal dilatation. Pancreas: Unremarkable. Spleen: Unremarkable. Adrenals/Urinary Tract: Left adrenal nodules are again seen, unchanged since at least 2020, likely benign adenomas. The right adrenal is unremarkable. The kidneys are unremarkable, with no  focal lesion, stone, hydronephrosis, or hydroureter. The bladder is unremarkable. There is symmetric excretion of contrast into the collecting systems on the delayed images. Stomach/Bowel: The stomach is unremarkable. There is no evidence of bowel obstruction. There is no abnormal bowel wall thickening or inflammatory change. The appendix is normal. Vascular/Lymphatic: There is scattered mild calcified atherosclerotic plaque in the nonaneurysmal abdominal aorta. The major branch vessels are patent. The main portal and splenic veins are patent. There is no abdominal or pelvic lymphadenopathy. Reproductive: The uterus is surgically absent. There is no  adnexal mass. Other: There is no ascites or free air. A small fat containing ventral abdominal hernia is unchanged. A 9 mm ovoid hypodense lesion in the left anterior abdominal wall subcutaneous fat may reflect a small sebaceous cyst. Musculoskeletal: There is no acute osseous abnormality or aggressive osseous lesion. Postsurgical changes are again noted at L4-L5 and L5-S1. IMPRESSION: No acute findings in the abdomen or pelvis. No significant change since 10/15/2021. Electronically Signed   By: Valetta Mole M.D.   On: 10/17/2021 16:11   US Abdomen Limited RUQ (LIVER/GB)  Result Date: 10/17/2021 CLINICAL DATA:  Right upper quadrant pain. EXAM: ULTRASOUND ABDOMEN LIMITED RIGHT UPPER QUADRANT COMPARISON:  CT abdomen and pelvis 10/2021. ultrasound abdomen 06/27/2008. FINDINGS: Gallbladder: Surgically absent. Common bile duct: Diameter: 5.7 mm. Liver: No focal lesion identified. Increase in parenchymal echogenicity. Portal vein is patent on color Doppler imaging with normal direction of blood flow towards the liver. Other: None. IMPRESSION: Cholecystectomy. Echogenic liver likely related to fatty infiltration. Electronically Signed   By: Ronney Asters M.D.   On: 10/17/2021 17:29      Past Medical History:  Diagnosis Date   Anxiety    Arthritis    rheumatoid , osteoarthritis   Bronchitis    Chronic back pain    Depression    Diabetes mellitus    Fatty liver    Fibromyalgia    GERD (gastroesophageal reflux disease)    Headache    Hypertension    Pancreatitis    Pneumonia    Rotator cuff tear, right     Past Surgical History:  Procedure Laterality Date   ABDOMINAL HYSTERECTOMY     ANTERIOR CERVICAL DECOMP/DISCECTOMY FUSION N/A 07/21/2017   Procedure: ANTERIOR CERVICAL DISCECTOMY AND FUSION C6-7, REMOVAL OF SCREW C6 PLATE, DEPUY ZERO-P IMPLANT, LOCAL BONE GRAFT, ALLOGRAFT BONE GRAFT, VIVIGEN;  Surgeon: Jessy Oto, MD;  Location: [facility];  Service: Orthopedics;  Laterality: N/A;   BACK  SURGERY     laminectomy   BONE GRAFT HIP ILIAC CREST     BREAST BIOPSY     CARPAL TUNNEL RELEASE     CERVICAL DISCECTOMY  07/21/2017   CESAREAN SECTION     CHOLECYSTECTOMY     COLONOSCOPY     frontal fusion     neck fusion     patient reported   SHOULDER ARTHROSCOPY WITH SUBACROMIAL DECOMPRESSION, ROTATOR CUFF REPAIR AND BICEP TENDON REPAIR Right 10/31/2017   Procedure: RIGHT SHOULDER ARTHROSCOPY, MANIPULATION UNDER ANESTHESIA, BICEPS TENODESIS, AND MINI OPEN ROTATOR CUFF TEAR REPAIR;  Surgeon: Meredith Pel, MD;  Location: [facility];  Service: Orthopedics;  Laterality: Right;    Prior to Admission medications   Medication Sig Start Date End Date Taking? Authorizing Provider  acetaminophen (TYLENOL) 500 MG tablet Take 500 mg by mouth 2 (two) times daily as needed for moderate pain.   Yes [provider]  albuterol (PROVENTIL HFA;VENTOLIN HFA) 108 (90 BASE) MCG/ACT inhaler Inhale 2 puffs  into the lungs every 4 (four) hours as needed for wheezing.   Yes [provider]  albuterol (PROVENTIL) (2.5 MG/3ML) 0.083% nebulizer solution Take 3 mLs (2.5 mg total) by nebulization every 6 (six) hours as needed for wheezing or shortness of breath. 12/12/19  Yes Yu, Amy V, PA-C  ALPRAZolam Duanne Moron) 1 MG tablet Take 1 mg by mouth daily as needed for anxiety. 10/23/19  Yes [provider]  amLODipine-olmesartan (AZOR) 5-40 MG tablet Take 1 tablet by mouth daily. 06/05/21  Yes [provider]  aspirin EC 81 MG tablet Take 81 mg by mouth daily. Swallow whole.   Yes [provider]  cetirizine (ZYRTEC) 10 MG tablet Take 10 mg by mouth daily as needed for allergies. 09/27/20  Yes [provider]  CREON 36000-114000 units CPEP capsule Take 36,000 Units by mouth daily. 10/16/21  Yes [provider]  cyclobenzaprine (FLEXERIL) 10 MG tablet Take 1 tablet (10 mg total) by mouth 2 (two) times daily as needed for muscle spasms. 11/27/20  Yes Jessy Oto, MD   dicyclomine (BENTYL) 10 MG capsule Take 10 mg by mouth daily. 10/16/21  Yes [provider]  escitalopram (LEXAPRO) 20 MG tablet Take 20 mg by mouth daily. 08/10/20  Yes [provider]  fluticasone (FLONASE) 50 MCG/ACT nasal spray Place 1 spray into both nostrils daily.   Yes [provider]  HYDROcodone-acetaminophen (NORCO/VICODIN) 5-325 MG tablet Take 1 tablet by mouth every 6 (six) hours as needed for moderate pain. 09/14/21  Yes Mariel Aloe, MD  loperamide (IMODIUM) 2 MG capsule Take 1 capsule (2 mg total) by mouth 4 (four) times daily as needed for diarrhea or loose stools. 09/09/21  Yes Hayden Rasmussen, MD  metFORMIN (GLUCOPHAGE) 1000 MG tablet Take 1,000 mg by mouth 2 (two) times daily. 03/26/21  Yes [provider]  metoprolol succinate (TOPROL-XL) 50 MG 24 hr tablet Take 50 mg by mouth daily. 07/21/14  Yes [provider]  ondansetron (ZOFRAN) 4 MG tablet Take 1 tablet (4 mg total) by mouth every 6 (six) hours. Patient taking differently: Take 4 mg by mouth every 8 (eight) hours as needed for vomiting or nausea. 10/15/21  Yes Valarie Merino, MD  phentermine (ADIPEX-P) 37.5 MG tablet Take 18.75 mg by mouth daily as needed (weight gain). 09/28/21  Yes [provider]  PRESCRIPTION MEDICATION Inject 60 Units into the skin daily. As per patient doctor changed her insulin on 10/16/21. Patient got samples from doctors office; not sure about the name.   Yes [provider]  simvastatin (ZOCOR) 20 MG tablet Take 20 mg by mouth daily. 08/22/20  Yes [provider]  sitaGLIPtin (JANUVIA) 100 MG tablet Take 100 mg by mouth daily.   Yes [provider]  traMADol (ULTRAM) 50 MG tablet Take 50 mg by mouth every 6 (six) hours as needed for moderate pain.   Yes [provider]  celecoxib (CELEBREX) 200 MG capsule Take 1 capsule (200 mg total) by mouth 2 (two) times daily. Patient not taking: Reported on 10/18/2021  09/03/21   Jessy Oto, MD  dicyclomine (BENTYL) 20 MG tablet Take 1 tablet (20 mg total) by mouth 2 (two) times daily. Patient not taking: Reported on 10/18/2021 04/20/21   Henderly, Britni A, PA-C  insulin glargine (LANTUS) 100 unit/mL SOPN Inject 60 Units into the skin at bedtime.    [provider]  ondansetron (ZOFRAN ODT) 4 MG disintegrating tablet Take 1 tablet (4 mg  total) by mouth every 8 (eight) hours as needed for nausea or vomiting. Patient not taking: Reported on 10/18/2021 09/09/21   Hayden Rasmussen, MD  pantoprazole (PROTONIX) 20 MG tablet Take 1 tablet (20 mg total) by mouth daily. Patient not taking: Reported on 10/18/2021 06/22/21 09/13/21  Loni Beckwith, PA-C  pantoprazole (PROTONIX) 40 MG tablet Take 40 mg by mouth 2 (two) times daily. Patient not taking: Reported on 10/18/2021 10/16/21   [provider]  promethazine (PHENERGAN) 25 MG tablet Take 1 tablet (25 mg total) by mouth every 6 (six) hours as needed for nausea or vomiting. Patient not taking: No sig reported 08/15/20 10/19/20  Wieters, Madelynn Done C, PA-C    Current Facility-Administered Medications  Medication Dose Route Frequency Provider Last Rate Last Admin   acetaminophen (TYLENOL) tablet 650 mg  650 mg Oral Q6H PRN Marcelyn Bruins, MD       Or   acetaminophen (TYLENOL) suppository 650 mg  650 mg Rectal Q6H PRN Marcelyn Bruins, MD       albuterol (PROVENTIL) (2.5 MG/3ML) 0.083% nebulizer solution 2.5 mg  2.5 mg Inhalation Q4H PRN Marcelyn Bruins, MD       ALPRAZolam Duanne Moron) tablet 1 mg  1 mg Oral Daily PRN Marcelyn Bruins, MD       alum & mag hydroxide-simeth (MAALOX/MYLANTA) 200-200-20 MG/5ML suspension 30 mL  30 mL Oral Once Marcelyn Bruins, MD       And   lidocaine (XYLOCAINE) 2 % viscous mouth solution 15 mL  15 mL Oral Once Marcelyn Bruins, MD       amLODipine (NORVASC) tablet 5 mg  5 mg Oral Daily Marcelyn Bruins, MD   5 mg at 10/18/21 1016   And   irbesartan (AVAPRO)  tablet 300 mg  300 mg Oral Daily Marcelyn Bruins, MD   300 mg at 10/18/21 1015   aspirin EC tablet 81 mg  81 mg Oral Daily Marcelyn Bruins, MD   81 mg at 10/18/21 1015   dicyclomine (BENTYL) tablet 20 mg  20 mg Oral BID Marcelyn Bruins, MD   20 mg at 10/18/21 2236   enoxaparin (LOVENOX) injection 40 mg  40 mg Subcutaneous Q24H Marcelyn Bruins, MD   40 mg at 10/18/21 2256   escitalopram (LEXAPRO) tablet 20 mg  20 mg Oral Daily Marcelyn Bruins, MD   20 mg at 10/18/21 1016   HYDROcodone-acetaminophen (NORCO/VICODIN) 5-325 MG per tablet 2 tablet  2 tablet Oral Q4H PRN Irene Pap N, DO       HYDROmorphone (DILAUDID) injection 1 mg  1 mg Intravenous Q2H PRN Irene Pap N, DO   1 mg at 10/19/21 0755   insulin aspart (novoLOG) injection 0-15 Units  0-15 Units Subcutaneous TID WC Marcelyn Bruins, MD   5 Units at 10/19/21 0754   insulin glargine-yfgn (SEMGLEE) injection 30 Units  30 Units Subcutaneous QHS Marcelyn Bruins, MD   30 Units at 10/18/21 2236   loperamide (IMODIUM) capsule 2 mg  2 mg Oral QID PRN Marcelyn Bruins, MD       metoprolol succinate (TOPROL-XL) 24 hr tablet 50 mg  50 mg Oral Daily Marcelyn Bruins, MD   50 mg at 10/18/21 1017   ondansetron (ZOFRAN) injection 4 mg  4 mg Intravenous Q6H PRN Marcelyn Bruins, MD       pantoprazole (PROTONIX) EC tablet 20 mg  20 mg Oral Daily Marcelyn Bruins,  MD   20 mg at 10/18/21 1017   polyethylene glycol (MIRALAX / GLYCOLAX) packet 17 g  17 g Oral Daily PRN Irene Pap N, DO       senna-docusate (Senokot-S) tablet 2 tablet  2 tablet Oral BID Hall, Carole N, DO       simvastatin (ZOCOR) tablet 20 mg  20 mg Oral Daily Marcelyn Bruins, MD   20 mg at 10/18/21 1016   sodium chloride flush (NS) 0.9 % injection 3 mL  3 mL Intravenous Q12H Marcelyn Bruins, MD   3 mL at 10/18/21 2238    Allergies as of 10/17/2021   (No Known Allergies)    Family History  Problem Relation Age of Onset   Hypertension  Father    Renal Disease Father    Diabetes Mother    Hypertension Mother    Edema Mother    Diabetes Sister    Diabetes Brother     Social History   Socioeconomic History   Marital status: Legally Separated    Spouse name: Not on file   Number of children: Not on file   Years of education: Not on file   Highest education level: Not on file  Occupational History   Occupation: disabled  Tobacco Use   Smoking status: Every Day    Packs/day: 1.00    Years: 40.00    Pack years: 40.00    Types: Cigarettes   Smokeless tobacco: Never  Vaping Use   Vaping Use: Never used  Substance and Sexual Activity   Alcohol use: No   Drug use: Yes    Types: Marijuana    Comment: 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 so stopped smoking THC, last use maybe 6 weeks ago   Sexual activity: Not on file  Other Topics Concern   Not on file  Social History Narrative   Not on file   Social Determinants of Health   Financial Resource Strain: Not on file  Food Insecurity: Not on file  Transportation Needs: Not on file  Physical Activity: Not on file  Stress: Not on file  Social Connections: Not on file  Intimate Partner Violence: Not on file    Review of Systems: All systems reviewed and negative except where noted in HPI.   OBJECTIVE    Physical Exam: Vital signs in last 24 hours: Temp:  [97.5 F (36.4 C)-98.5 F (36.9 C)] 98 F (36.7 C) (03/10 0418) Pulse Rate:  [63-78] 64 (03/10 0418) Resp:  [13-19] 18 (03/10 0418) BP: (123-151)/(73-87) 151/82 (03/10 0418) SpO2:  [90 %-99 %] 98 % (03/10 0418) Weight:  [96.6 kg] 96.6 kg (03/10 0418)    General:  Alert female in NAD Psych:  Pleasant, cooperative. Normal mood and affect Eyes: Pupils equal, no icterus. Conjunctive pink Ears:  Normal auditory acuity Nose: No deformity, discharge or lesions Neck:  Supple, no masses felt Lungs:  Clear to auscultation.  Heart:  Regular rate, regular rhythm. No lower extremity  edema Abdomen:  Soft, nondistended, mild generalized tenderness. Active bowel sounds, no masses felt Rectal :  Deferred Msk: Symmetrical without gross deformities.  Neurologic:  Alert, oriented, grossly normal neurologically Skin:  Intact without significant lesions.    Scheduled inpatient medications  alum & mag hydroxide-simeth  30 mL Oral Once   And   lidocaine  15 mL Oral Once   amLODipine  5 mg Oral Daily   And   irbesartan  300 mg Oral Daily   aspirin EC  81 mg Oral Daily   dicyclomine  20 mg Oral BID   enoxaparin (LOVENOX) injection  40 mg Subcutaneous Q24H   escitalopram  20 mg Oral Daily   insulin aspart  0-15 Units Subcutaneous TID WC   insulin glargine-yfgn  30 Units Subcutaneous QHS   metoprolol succinate  50 mg Oral Daily   pantoprazole  20 mg Oral Daily   senna-docusate  2 tablet Oral BID   simvastatin  20 mg Oral Daily   sodium chloride flush  3 mL Intravenous Q12H      Intake/Output from previous day: 03/09 0701 - 03/10 0700 In: 480 [P.O.:480] Out: -  Intake/Output this shift: No intake/output data recorded.   Lab Results: Recent Labs    10/17/21 1259 10/18/21 0544 10/19/21 0554  WBC 10.8* 14.3* 11.2*  HGB 13.9 13.8 14.0  HCT 41.4 40.7 42.1  PLT 359 365 353   BMET Recent Labs    10/17/21 1259 10/18/21 0544 10/19/21 0554  NA 138 133* 134*  K 3.7 3.9 3.9  CL 102 98 99  CO2 '25 26 27  '$ GLUCOSE 279* 277* 212*  BUN '8 11 14  '$ CREATININE 0.51 0.52 0.68  CALCIUM 9.3 8.8* 8.6*   LFTs Recent Labs    10/19/21 0554  PROT 6.8  ALBUMIN 3.7  AST 17  ALT 16  ALKPHOS 84  BILITOT 0.6   PT/INR No results for input(s): LABPROT, INR in the last 72 hours. Hepatitis Panel No results for input(s): HEPBSAG, HCVAB, HEPAIGM, HEPBIGM in the last 72 hours.   . CBC Latest Ref Rng & Units 10/19/2021 10/18/2021 10/17/2021  WBC 4.0 - 10.5 K/uL 11.2(H) 14.3(H) 10.8(H)  Hemoglobin 12.0 - 15.0 g/dL 14.0 13.8 13.9  Hematocrit 36.0 - 46.0 % 42.1 40.7 41.4   Platelets 150 - 400 K/uL 353 365 359    . CMP Latest Ref Rng & Units 10/19/2021 10/18/2021 10/17/2021  Glucose 70 - 99 mg/dL 212(H) 277(H) 279(H)  BUN 6 - 20 mg/dL '14 11 8  '$ Creatinine 0.44 - 1.00 mg/dL 0.68 0.52 0.51  Sodium 135 - 145 mmol/L 134(L) 133(L) 138  Potassium 3.5 - 5.1 mmol/L 3.9 3.9 3.7  Chloride 98 - 111 mmol/L 99 98 102  CO2 22 - 32 mmol/L '27 26 25  '$ Calcium 8.9 - 10.3 mg/dL 8.6(L) 8.8(L) 9.3  Total Protein 6.5 - 8.1 g/dL 6.8 7.4 8.2(H)  Total Bilirubin 0.3 - 1.2 mg/dL 0.6 0.5 0.5  Alkaline Phos 38 - 126 U/L 84 101 122  AST 15 - 41 U/L '17 15 20  '$ ALT 0 - 44 U/L '16 20 24     '$ Tye Savoy, NP-C @  10/19/2021, 9:33 AM   _______________________________________________________________________________________________________________________________________  Velora Heckler GI MD note:  I personally examined the patient, reviewed the data and agree with the assessment and plan described above.  I provided a substantive portion of the care of this patient (personally provided more than half of the total time dedicated to the treatment of this patient).  She has had abdominal pains for almost 20 years, has undergone extensive testing over that time by many providers, including Gi Dr. Collene Mares and more recently GI at [facility].  In the past 3 years she has had 17 abdominal CT scan between [place] (not sure about other healthcare facilities), a gastric emptying scan, an EGD and a colonoscopy as well as numerous blood tests.  I warned her that she should absolutely try to avoid any further radiation from CT or other  imaging studies because of the accumulating radiation risk, including cancer. She was shocked to hear that and became quite accusatory about never hearing about that risk in the past.  I think her pains are probably accentuated IBS related discomforts, perhaps related to adhesive disease, perhaps underlying SIBO, perhaps related to chronic [place] use, perhaps 'narcotic  bowel syndrome" and certainly cannot exclude drug seeking as well.  I do not recommend any further GI testing and instead she needs to develop coping methods to deal with the pains without returning to emergency rooms. We will order Bactrim DS BID for 10 days for possible SIBO.  Would increase her diet as tolerated and discharge her when she is ready, trying best to avoid narcotic pain medicines for a pain which has been present for 20 years and we really don't understand.  She should follow up with [facility] or her previous GI Dr. Collene Mares here in [place].   Owens Loffler, MD [facility] Pager [phone]

## 2021-10-20 LAB — GLUCOSE, CAPILLARY
Glucose-Capillary: 229 mg/dL — ABNORMAL HIGH (ref 70–99)
Glucose-Capillary: 280 mg/dL — ABNORMAL HIGH (ref 70–99)

## 2021-10-20 MED ORDER — SULFAMETHOXAZOLE-TRIMETHOPRIM 800-160 MG PO TABS: 1.0000 | ORAL_TABLET | Freq: Two times a day (BID) | ORAL | 0 refills | Status: AC

## 2021-10-20 NOTE — Discharge Summary (Signed)
Discharge Summary  [name] ZOX:[id number] DOB: [birth year]  PCP: Nolene Ebbs, MD  Admit date: 10/17/2021 Discharge date: 10/20/2021  Time spent: 35 minutes.  Recommendations for Outpatient Follow-up:  Follow-up with your gastroenterologist at [facility] within a week. Follow-up with your primary care provider in 1 to 2 weeks. Resume your home medications.  Discharge Diagnoses:  Active [facility] Problems   Diagnosis Date Noted   Intractable nausea and vomiting 09/12/2021   Anxiety 09/12/2021   Marijuana abuse 09/09/2021   Diabetes mellitus type 2 in obese [name]) 09/09/2021   Essential hypertension 05/29/2020    Resolved Kemp Problems  No resolved problems to display.    Discharge Condition: Stable.  Diet recommendation: Resume previous diet.  Vitals:   10/19/21 2101 10/20/21 0544  BP: (!) 144/71 (!) 180/103  Pulse: 67 75  Resp: 18 18  Temp: 98.3 F (36.8 C) 98 F (36.7 C)  SpO2: 97% 97%    History of present illness:   [name] is a 56 y.o. female with medical history significant of cervical spinal fusion, marijuana use, intractable nausea and vomiting, hypertension, type II diabetes, obesity, chronic abdominal pain, IBS, anxiety, GERD, pancreatitis presented with abdominal pain and recurrent intractable nausea and vomiting.  Work-up revealed CT abdomen pelvis without acute abnormality, normal LFTs, normal lipase level.  Patient received pain control IV fentanyl, morphine, GI cocktail, Zofran, viscous lidocaine, fluids in the ED without improvement of her symptomatology.  No clear explanation for her worsening chronic abdominal pain.  GI consulted.  No plan for further GI testing, started on Bactrim DS 1 tablet twice daily x10 days for possible SIBO.   10/20/21:  Patient was seen at bedside.  Not in acute distress.  Voices dissatisfaction towards the staff.  Kemp Course:  Principal Problem:   Intractable nausea and vomiting Active Problems:    Essential hypertension   Diabetes mellitus type 2 in obese [facility])   Marijuana abuse   Anxiety  Refractory chronic abdominal pain, possible SIBO.   Continue oral antibiotics Bactrim DS, to complete 10 days, as recommended by GI Follow-up with your gastroenterologist within a week.   Diabetes type II with hyperglycemia Last hemoglobin A1c 9.9 from 09/12/2021. Resume home regimen.  Hypertension Resume home regimen Follow-up with your primary care provider.  Anxiety Resume home regimen Follow-up with your primary care provider.  GERD - Continue home PPI     Code Status:              Full code.       Procedures: None.  Consultations: GI.  Discharge Exam: BP (!) 180/103 (BP Location: Left Arm)    Pulse 75    Temp 98 F (36.7 C) (Oral)    Resp 18    Ht '5\' 4"'$  (1.626 m)    Wt 96 kg    SpO2 97%    BMI 36.33 kg/m  General: 56 y.o. year-old female well developed well nourished in no acute distress.  Alert and oriented x3. Cardiovascular: Regular rate and rhythm with no rubs or gallops.  No thyromegaly or JVD noted.   Respiratory: Clear to auscultation with no wheezes or rales. Good inspiratory effort. Abdomen: Soft non distended with normal bowel sounds x4 quadrants. Musculoskeletal: No lower extremity edema.  Psychiatry: Mood is appropriate for condition and setting  Discharge Instructions You were cared for by a hospitalist during your Kemp stay. If you have any questions about your discharge medications or the care you received while you were in  the Kemp after you are discharged, you can call the unit and asked to speak with the hospitalist on call if the hospitalist that took care of you is not available. Once you are discharged, your primary care physician will handle any further medical issues. Please note that NO REFILLS for any discharge medications will be authorized once you are discharged, as it is imperative that you return to your primary care physician (or  establish a relationship with a primary care physician if you do not have one) for your aftercare needs so that they can reassess your need for medications and monitor your lab values.  Discharge Instructions     Amb Referral to Nutrition and Diabetic Education   Complete by: As directed       Allergies as of 10/20/2021   No Known Allergies      Medication List     STOP taking these medications    celecoxib 200 MG capsule Commonly known as: CeleBREX   ondansetron 4 MG disintegrating tablet Commonly known as: Zofran ODT   pantoprazole 20 MG tablet Commonly known as: PROTONIX   pantoprazole 40 MG tablet Commonly known as: PROTONIX       TAKE these medications    acetaminophen 500 MG tablet Commonly known as: TYLENOL Take 500 mg by mouth 2 (two) times daily as needed for moderate pain.   albuterol (2.5 MG/3ML) 0.083% nebulizer solution Commonly known as: PROVENTIL Take 3 mLs (2.5 mg total) by nebulization every 6 (six) hours as needed for wheezing or shortness of breath.   albuterol 108 (90 Base) MCG/ACT inhaler Commonly known as: VENTOLIN HFA Inhale 2 puffs into the lungs every 4 (four) hours as needed for wheezing.   ALPRAZolam 1 MG tablet Commonly known as: XANAX Take 1 mg by mouth daily as needed for anxiety.   amLODipine-olmesartan 5-40 MG tablet Commonly known as: AZOR Take 1 tablet by mouth daily.   aspirin EC 81 MG tablet Take 81 mg by mouth daily. Swallow whole.   cetirizine 10 MG tablet Commonly known as: ZYRTEC Take 10 mg by mouth daily as needed for allergies.   Creon 36000 UNITS Cpep capsule Generic drug: lipase/protease/amylase Take 36,000 Units by mouth daily.   cyclobenzaprine 10 MG tablet Commonly known as: FLEXERIL Take 1 tablet (10 mg total) by mouth 2 (two) times daily as needed for muscle spasms.   dicyclomine 10 MG capsule Commonly known as: BENTYL Take 10 mg by mouth daily. What changed: Another medication with the same name  was removed. Continue taking this medication, and follow the directions you see here.   escitalopram 20 MG tablet Commonly known as: LEXAPRO Take 20 mg by mouth daily.   fluticasone 50 MCG/ACT nasal spray Commonly known as: FLONASE Place 1 spray into both nostrils daily.   HYDROcodone-acetaminophen 5-325 MG tablet Commonly known as: NORCO/VICODIN Take 1 tablet by mouth every 6 (six) hours as needed for moderate pain.   insulin glargine 100 unit/mL Sopn Commonly known as: LANTUS Inject 60 Units into the skin at bedtime.   loperamide 2 MG capsule Commonly known as: IMODIUM Take 1 capsule (2 mg total) by mouth 4 (four) times daily as needed for diarrhea or loose stools.   metFORMIN 1000 MG tablet Commonly known as: GLUCOPHAGE Take 1,000 mg by mouth 2 (two) times daily.   metoprolol succinate 50 MG 24 hr tablet Commonly known as: TOPROL-XL Take 50 mg by mouth daily.   ondansetron 4 MG tablet Commonly known as: ZOFRAN Take  1 tablet (4 mg total) by mouth every 6 (six) hours. What changed:  when to take this reasons to take this   phentermine 37.5 MG tablet Commonly known as: ADIPEX-P Take 18.75 mg by mouth daily as needed (weight gain).   PRESCRIPTION MEDICATION Inject 60 Units into the skin daily. As per patient doctor changed her insulin on 10/16/21. Patient got samples from doctors office; not sure about the name.   simvastatin 20 MG tablet Commonly known as: ZOCOR Take 20 mg by mouth daily.   sitaGLIPtin 100 MG tablet Commonly known as: JANUVIA Take 100 mg by mouth daily.   sulfamethoxazole-trimethoprim 800-160 MG tablet Commonly known as: BACTRIM DS Take 1 tablet by mouth every 12 (twelve) hours for 9 days.   traMADol 50 MG tablet Commonly known as: ULTRAM Take 50 mg by mouth every 6 (six) hours as needed for moderate pain.       No Known Allergies  Follow-up Information     Nolene Ebbs, MD. Call today.   Specialty: Internal Medicine Why: Please  call for a posthospital follow-up appointment. Contact information: [place] [address] [phone]                  The results of significant diagnostics from this hospitalization (including imaging, microbiology, ancillary and laboratory) are listed below for reference.    Significant Diagnostic Studies: CT ABDOMEN PELVIS W CONTRAST  Result Date: 10/17/2021 CLINICAL DATA:  Severe abdominal pain beginning Monday, history of pancreatitis, diabetes. Nausea, vomiting common diarrhea EXAM: CT ABDOMEN AND PELVIS WITH CONTRAST TECHNIQUE: Multidetector CT imaging of the abdomen and pelvis was performed using the standard protocol following bolus administration of intravenous contrast. RADIATION DOSE REDUCTION: This exam was performed according to the departmental dose-optimization program which includes automated exposure control, adjustment of the mA and/or kV according to patient size and/or use of iterative reconstruction technique. CONTRAST:  137m OMNIPAQUE IOHEXOL 300 MG/ML  SOLN COMPARISON:  CT abdomen/pelvis 10/15/2021 FINDINGS: Lower chest: The lung bases are clear. The imaged heart is unremarkable. Hepatobiliary: The liver is unremarkable. The gallbladder is surgically absent. There is no biliary ductal dilatation. Pancreas: Unremarkable. Spleen: Unremarkable. Adrenals/Urinary Tract: Left adrenal nodules are again seen, unchanged since at least 2020, likely benign adenomas. The right adrenal is unremarkable. The kidneys are unremarkable, with no focal lesion, stone, hydronephrosis, or hydroureter. The bladder is unremarkable. There is symmetric excretion of contrast into the collecting systems on the delayed images. Stomach/Bowel: The stomach is unremarkable. There is no evidence of bowel obstruction. There is no abnormal bowel wall thickening or inflammatory change. The appendix is normal. Vascular/Lymphatic: There is scattered mild calcified atherosclerotic plaque in  the nonaneurysmal abdominal aorta. The major branch vessels are patent. The main portal and splenic veins are patent. There is no abdominal or pelvic lymphadenopathy. Reproductive: The uterus is surgically absent. There is no adnexal mass. Other: There is no ascites or free air. A small fat containing ventral abdominal hernia is unchanged. A 9 mm ovoid hypodense lesion in the left anterior abdominal wall subcutaneous fat may reflect a small sebaceous cyst. Musculoskeletal: There is no acute osseous abnormality or aggressive osseous lesion. Postsurgical changes are again noted at L4-L5 and L5-S1. IMPRESSION: No acute findings in the abdomen or pelvis. No significant change since 10/15/2021. Electronically Signed   By: PValetta MoleM.D.   On: 10/17/2021 16:11   CT ABDOMEN PELVIS W CONTRAST  Result Date: 10/15/2021 CLINICAL DATA:  Abdominal pain beginning yesterday with nausea,  vomiting and diarrhea. History of pancreatitis and diabetes. EXAM: CT ABDOMEN AND PELVIS WITH CONTRAST TECHNIQUE: Multidetector CT imaging of the abdomen and pelvis was performed using the standard protocol following bolus administration of intravenous contrast. RADIATION DOSE REDUCTION: This exam was performed according to the departmental dose-optimization program which includes automated exposure control, adjustment of the mA and/or kV according to patient size and/or use of iterative reconstruction technique. CONTRAST:  186m OMNIPAQUE IOHEXOL 300 MG/ML  SOLN COMPARISON:  09/09/2021, 01/25/2020 FINDINGS: Lower chest: Lung bases are unremarkable. Hepatobiliary: Previous cholecystectomy. Liver and biliary tree are unremarkable. Pancreas: Normal. Spleen: Normal. Adrenals/Urinary Tract: Right adrenal gland is normal. Two small stable left adrenal nodules likely adenomas. Kidneys are normal in size without hydronephrosis or nephrolithiasis. Ureters and bladder are normal. Stomach/Bowel: Stomach and small bowel are normal. Appendix is normal.  Colon is normal. Vascular/Lymphatic: Mild calcified plaque over the abdominal aorta which is normal caliber. No evidence of adenopathy. Reproductive: Normal. Other: No free fluid or focal inflammatory change. Stable left rectus abdominus atrophy. Stable tiny ventral hernia in the midline just above the umbilicus containing only peritoneal fat. Musculoskeletal: Minimal degenerative change of the spine. Interbody fusion at the L4-5 and L5-S1 levels. Degenerative change of the hips. IMPRESSION: 1. No acute findings in the abdomen/pelvis. 2. Two small stable left adrenal nodules likely adenomas. 3. Stable tiny midline ventral hernia just above the umbilicus containing only peritoneal fat. 4. Aortic atherosclerosis. Aortic Atherosclerosis (ICD10-I70.0). Electronically Signed   By: DMarin OlpM.D.   On: 10/15/2021 13:47   UKoreaAbdomen Limited RUQ (LIVER/GB)  Result Date: 10/17/2021 CLINICAL DATA:  Right upper quadrant pain. EXAM: ULTRASOUND ABDOMEN LIMITED RIGHT UPPER QUADRANT COMPARISON:  CT abdomen and pelvis 10/2021. ultrasound abdomen 06/27/2008. FINDINGS: Gallbladder: Surgically absent. Common bile duct: Diameter: 5.7 mm. Liver: No focal lesion identified. Increase in parenchymal echogenicity. Portal vein is patent on color Doppler imaging with normal direction of blood flow towards the liver. Other: None. IMPRESSION: Cholecystectomy. Echogenic liver likely related to fatty infiltration. Electronically Signed   By: ARonney AstersM.D.   On: 10/17/2021 17:29    Microbiology: Recent Results (from the past 240 hour(s))  Resp Panel by RT-PCR (Flu A&B, Covid) Nasopharyngeal Swab     Status: None   Collection Time: 10/17/21 11:14 PM   Specimen: Nasopharyngeal Swab; Nasopharyngeal(NP) swabs in vial transport medium  Result Value Ref Range Status   SARS Coronavirus 2 by RT PCR NEGATIVE NEGATIVE Final    Comment: (NOTE) SARS-CoV-2 target nucleic acids are NOT DETECTED.  The SARS-CoV-2 RNA is generally  detectable in upper respiratory specimens during the acute phase of infection. The lowest concentration of SARS-CoV-2 viral copies this assay can detect is 138 copies/mL. A negative result does not preclude SARS-Cov-2 infection and should not be used as the sole basis for treatment or other patient management decisions. A negative result may occur with  improper specimen collection/handling, submission of specimen other than nasopharyngeal swab, presence of viral mutation(s) within the areas targeted by this assay, and inadequate number of viral copies(<138 copies/mL). A negative result must be combined with clinical observations, patient history, and epidemiological information. The expected result is Negative.  Fact Sheet for Patients:  hEntrepreneurPulse.com.au Fact Sheet for Healthcare Providers:  hIncredibleEmployment.be This test is no t yet approved or cleared by the [place] and  has been authorized for detection and/or diagnosis of SARS-CoV-2 by FDA under an Emergency Use Authorization (EUA). This EUA will remain  in effect (meaning  this test can be used) for the duration of the COVID-19 declaration under Section 564(b)(1) of the Act, 21 U.S.C.section 360bbb-3(b)(1), unless the authorization is terminated  or revoked sooner.       Influenza A by PCR NEGATIVE NEGATIVE Final   Influenza B by PCR NEGATIVE NEGATIVE Final    Comment: (NOTE) The Xpert Xpress SARS-CoV-2/FLU/RSV plus assay is intended as an aid in the diagnosis of influenza from Nasopharyngeal swab specimens and should not be used as a sole basis for treatment. Nasal washings and aspirates are unacceptable for Xpert Xpress SARS-CoV-2/FLU/RSV testing.  Fact Sheet for Patients: EntrepreneurPulse.com.au  Fact Sheet for Healthcare Providers: IncredibleEmployment.be  This test is not yet approved or cleared by the [place] FDA  and has been authorized for detection and/or diagnosis of SARS-CoV-2 by FDA under an Emergency Use Authorization (EUA). This EUA will remain in effect (meaning this test can be used) for the duration of the COVID-19 declaration under Section 564(b)(1) of the Act, 21 U.S.C. section 360bbb-3(b)(1), unless the authorization is terminated or revoked.  Performed at [facility], [place] [address], [place], [place] [address]      Labs: Basic Metabolic Panel: Recent Labs  Lab 10/15/21 1100 10/16/21 0000 10/17/21 1259 10/18/21 0544 10/19/21 0554  NA 136 138 138 133* 134*  K 4.8 4.2 3.7 3.9 3.9  CL 103 100 102 98 99  CO2 '24 25 25 26 27  '$ GLUCOSE 245* 378* 279* 277* 212*  BUN '9 12 8 11 14  '$ CREATININE 0.56 0.70 0.51 0.52 0.68  CALCIUM 8.8* 9.3 9.3 8.8* 8.6*  MG  --   --   --   --  2.0  PHOS  --   --   --   --  3.7   Liver Function Tests: Recent Labs  Lab 10/15/21 1100 10/16/21 0000 10/17/21 1259 10/18/21 0544 10/19/21 0554  AST 36 '14 20 15 17  '$ ALT '19 18 24 20 16  '$ ALKPHOS 102  --  122 101 84  BILITOT 1.9* 0.4 0.5 0.5 0.6  PROT 7.6 6.5 8.2* 7.4 6.8  ALBUMIN 4.2  --  4.6 4.1 3.7   Recent Labs  Lab 10/15/21 1100 10/17/21 1259  LIPASE 39 38   No results for input(s): AMMONIA in the last 168 hours. CBC: Recent Labs  Lab 10/15/21 1100 10/16/21 0000 10/17/21 1259 10/18/21 0544 10/19/21 0554  WBC 11.3* 10.0 10.8* 14.3* 11.2*  NEUTROABS 7.0  --  8.0*  --  6.3  HGB 13.5 12.9 13.9 13.8 14.0  HCT 40.4 39.3 41.4 40.7 42.1  MCV 83.5 85.2 84.0 83.9 84.7  PLT 361 376 359 365 353   Cardiac Enzymes: No results for input(s): CKTOTAL, CKMB, CKMBINDEX, TROPONINI in the last 168 hours. BNP: BNP (last 3 results) No results for input(s): BNP in the last 8760 hours.  ProBNP (last 3 results) No results for input(s): PROBNP in the last 8760 hours.  CBG: Recent Labs  Lab 10/19/21 1202 10/19/21 1708 10/19/21 2104 10/20/21 0752 10/20/21 1144  GLUCAP 219*  279* 236* 229* 280*       Signed:  Kayleen Memos, MD [facility] 10/20/2021, 6:04 PM

## 2021-10-20 NOTE — Discharge Instructions (Addendum)
Outpatient Substance Use Treatment Services   [facility] [address] [place], [place] [address]  [phone] Private insurance, Medicare A&B, and GCCN   [facility]  [address]  [place], [place] [address] [phone] Medicaid, Self Pay   [facility]      [address]  [place], [place] [phone] Medicaid and Private Insurance, Self Pay   [facility] [address]  [place], [place]  [phone] Private Insurance, and Self Pay  [facility]      [address]    [place], [place] [address]  [phone] or [phone] Private Insurance Only   [facility] [address]  [place], [place] [address] [phone] Medicaid, Medicare, Private Insurance  [facility] [address]  [place], [place] [address] [phone] Services are free or reduced  [facility]  [address] [phone]  Self Pay only, sliding scale  [facility]  [address]  [place], [place] [address] [phone] ([facility]) Self Pay, Medicaid Only   [facility] [address]  [place], [place] [address] [phone] Medicaid, Medicare, Private Insurance  Residential Substance Use Treatment Services   [facility]  [address]  [place], [place] [address]  [phone] or [phone] Detox (Medicare, Medicaid, private insurance, and self pay)  Residential Rehab 14 days (Medicare, Medicaid, private insurance, and self pay)   [facility]  [address] [place], [place]  [phone]  Female and Female Detox (Self Pay and Medicaid limited availability)  Rehab only Female (Medicaid and self pay only)   [facility]      [address]   [place], [place] [address]  [phone] or [phone] Detox and Residential Treatment Private Insurance Only   [facility]  [address]  [place], [place] [address]  [phone]  Treatment Only, must make assessment appointment, and must be sober for assessment appointment.  Self Pay Only, Medicare A&B, [place] Medicaid, [place] ID only! *Transportation assistance offered from [employer] [address]  [facility]     [address] [place], [place] [address] Walk in interviews M-Sat 8-4p No pending legal charges [phone]  [facility]:  [facility]  [address] [place], [place] [phone] (Self Pay, Medicaid)  [facility] [address] [place], [place] [address] [phone] Detox and Residential Treatment Medicare and Private Insurance  [facility] [address]  [place], [place] [address] 28 Day Women's Facility: [phone] 28 Day Men's Facility: [phone] Long-term Residential Program:  [phone] Males 25 and Over (No Insurance, upfront fee)  [facility]  [address] [place], [place] [address] [phone] Private Insurance with [employer], Private Pay  [facility] [address] [place], [place] [address] Local [phone] Private Insurance Only  [facility] [address]  [place], [place] [address]  [phone] (Males, upfront fee)  [facility] [place] [address]  [place], [address] [phone] Private Insurance   [facility] Locations  [facility]  [address]  [place], [place]  [phone] Christian Based Program for individuals experiencing homelessness Self Pay, No insurance  [facility]  Men's program: [facility] [address]  [place],  [address] [phone]  [facility]: [facility] [address]   [place], [place] [address] [phone] Christian Based Program for individuals  experiencing homelessness Self Pay, No insurance  [facility] [address]  [place], [place] [address]  [phone] Christian Based Program for individuals experiencing homelessness Self Pay, No insurance  [facility] [address]  , [place] [address] [phone] Christian Based Program for individuals experiencing homelessness Self Pay, No insurance  [facility] [address] , [place] [phone] Christian Based Program for males experiencing homelessness Self Pay, No insurance                    

## 2021-10-20 NOTE — TOC Transition Note (Signed)
Transition of Care (TOC) - CM/SW Discharge Note ? ? ?Patient Details  ?Name: [name] ?MRN: [id number] ?Date of Birth: 01/23/1966 ? ?Transition of Care (TOC) CM/SW Contact:  ?Ross Ludwig, LCSW ?Phone Number: ?10/20/2021, 9:47 AM ? ? ?Clinical Narrative:    ? ?CSW received consult for substance abuse resources.  CSW added to the AVS, CSW signing off, please reconsult if other social work needs arise. ? ? ?Final next level of care: Home/Self Care ?Barriers to Discharge: Barriers Resolved ? ? ?Patient Goals and CMS Choice ?Patient states their goals for this hospitalization and ongoing recovery are:: To return back home. ?  ?  ? ?Discharge Placement ?  ?           ?  ?  ?  ?  ? ?Discharge Plan and Services ?  ?  ?           ?  ?  ?  ?  ?  ?  ?  ?  ?  ?  ? ?Social Determinants of Health (SDOH) Interventions ?  ? ? ?Readmission Risk Interventions ?Readmission Risk Prevention Plan 09/14/2021  ?Transportation Screening Complete  ?PCP or Specialist Appt within 3-5 Days Complete  ?[place] or Home Care Consult Complete  ?Palliative Care Screening Complete  ?Medication Review Press photographer) Complete  ?Some recent data might be hidden  ? ? ? ? ? ?

## 2021-10-20 NOTE — Progress Notes (Signed)
Arrived at patients' room patient was standing in room dressed and ready to leave. Patient was crying/venting her frustration with encounter she had with MD on 10/19/2021. RN and I provided emotional support while other staff was called for additional assistance. Patient decided against leaving AMA and needs were provided for  as requested. Patient in chair resting, no signs of distress noted. Safety measures in place, no additional needs at this time.  ? ?Report given to oncoming RN who will follow up with patients' request to locate shoes.  ?

## 2021-10-23 ENCOUNTER — Ambulatory Visit: Payer: Medicare Other

## 2021-10-23 ENCOUNTER — Telehealth: Payer: Self-pay

## 2021-10-23 NOTE — Telephone Encounter (Signed)
LVM for patient regarding missed appointment today. 2nd no show, cancelling further appointments except for next appointment that is next Tuesday and reminded patient of attendance policy. Informed to call the clinic if she needs to reschedule. ?

## 2021-10-23 NOTE — Therapy (Incomplete)
?OUTPATIENT PHYSICAL THERAPY TREATMENT NOTE ? ? ?Patient Name: [name] ?MRN: [id number] ?DOB:03/31/1966, 56 y.o., female ?Today's Date: 10/23/2021 ? ?PCP: Nolene Ebbs, MD ?REFERRING PROVIDER: Jessy Oto, MD ? ? ? ? ?Past Medical History:  ?Diagnosis Date  ? Anxiety   ? Arthritis   ? rheumatoid , osteoarthritis  ? Bronchitis   ? Chronic back pain   ? Depression   ? Diabetes mellitus   ? Fatty liver   ? Fibromyalgia   ? GERD (gastroesophageal reflux disease)   ? Headache   ? Hypertension   ? Pancreatitis   ? Pneumonia   ? Rotator cuff tear, right   ? ?Past Surgical History:  ?Procedure Laterality Date  ? ABDOMINAL HYSTERECTOMY    ? ANTERIOR CERVICAL DECOMP/DISCECTOMY FUSION N/A 07/21/2017  ? Procedure: ANTERIOR CERVICAL DISCECTOMY AND FUSION C6-7, REMOVAL OF SCREW C6 PLATE, DEPUY ZERO-P IMPLANT, LOCAL BONE GRAFT, ALLOGRAFT BONE GRAFT, VIVIGEN;  Surgeon: Jessy Oto, MD;  Location: [facility];  Service: Orthopedics;  Laterality: N/A;  ? BACK SURGERY    ? laminectomy  ? BONE GRAFT HIP ILIAC CREST    ? BREAST BIOPSY    ? CARPAL TUNNEL RELEASE    ? CERVICAL DISCECTOMY  07/21/2017  ? CESAREAN SECTION    ? CHOLECYSTECTOMY    ? COLONOSCOPY    ? frontal fusion    ? neck fusion    ? patient reported  ? SHOULDER ARTHROSCOPY WITH SUBACROMIAL DECOMPRESSION, ROTATOR CUFF REPAIR AND BICEP TENDON REPAIR Right 10/31/2017  ? Procedure: RIGHT SHOULDER ARTHROSCOPY, MANIPULATION UNDER ANESTHESIA, BICEPS TENODESIS, AND MINI OPEN ROTATOR CUFF TEAR REPAIR;  Surgeon: Meredith Pel, MD;  Location: [facility];  Service: Orthopedics;  Laterality: Right;  ? ?Patient Active Problem List  ? Diagnosis Date Noted  ? Adrenal adenoma, left 09/13/2021  ? Intractable nausea and vomiting 09/12/2021  ? Anxiety 09/12/2021  ? Gastroenteritis 09/09/2021  ? Diabetes mellitus type 2 in obese ([place]) 09/09/2021  ? Class 2 obesity due to excess calories with body mass index (BMI) of 36.0 to 36.9 in adult 09/09/2021  ? Marijuana abuse 09/09/2021  ?  Tobacco dependence 09/09/2021  ? Essential hypertension 05/29/2020  ? Abdominal pain 11/23/2019  ? Acute pancreatitis 11/21/2019  ? De Quervain's syndrome (tenosynovitis) 07/06/2018  ? 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  ? Complete tear of right rotator cuff 07/22/2017  ?  Class: Chronic  ? Retained orthopedic hardware 07/22/2017  ?  Class: Chronic  ? Cervical disc herniation 07/21/2017  ?  Class: Acute  ? Status post cervical spinal fusion 07/21/17 07/21/2017  ? Chronic back pain   ? ? ?REFERRING DIAG: M48.07 (ICD-10-CM) - Spinal stenosis of lumbosacral region M43.16 (ICD-10-CM) - Spondylolisthesis of lumbar region M50.30 (ICD-10-CM) - Degenerative disc disease, cervical  ? ?THERAPY DIAG:  ?No diagnosis found. ? ?PERTINENT HISTORY: 56 year old right handed female with history of C5-6 and C6-7 ACDF, last surgery 2018 zero implant at the C6-7 level. She has been experiencing pain into the neck and numbness and tingling into the left hand and on the right side it goes down into the right elbow. No bowel or bladder difficulty. She is able to walk okay at times but she has more pain with sitting and has to adjust when she gets up. There in both the neck and low back. There is a pain like a rubberband at the neck. The lower back is a constant pain and throbbing. Pain with sitting and bending and stooping. In the past  has had anterior lumbar fusion done for area that had previous surgery by Dr. Laverta Baltimore years ago. Has weakness in her arms and legs.She is on disability, since this happened, older she gets the worse she feels and feel like her situation is getting worse. History of previous right CTR and has seen a neurologist, Dr. Mariea Stable earlier this year and he ordered EMG/NCV.She did not have these done and has not rescheduled do to avoiding needle poke.  ? ?PRECAUTIONS: Cervical, Back, and Shoulder cervical and lumbar fusions, R RCR repair 2019 ? ?ONSET DATE: chronic since 2018 ? ?SUBJECTIVE: *** ?I'm hurting everywhere today, in  my shoulders, neck, arms, back, legs, and knees. ? ?PAIN:  ?Are you having pain? Yes ?NPRS scale: ***/10 ?Pain location: neck, back and R shoulder pain ?Pain orientation: Right and Bilateral  ?PAIN TYPE: aching, burning, and tight ?Pain description: constant  ?Aggravating factors: prolonged positions ?Relieving factors: position changes ? ? ? ? ?OBJECTIVE:  ?  ?DIAGNOSTIC FINDINGS:  ?XR Cervical Spine 2 or 3 views ?  ?Result Date: 09/03/2021 ?AP and lateral flexion and extension radiographs show previous ACDF C5-6 with plate and screws and C6-7 with Zero P implant no motion noted across these segments. There is minimal narrowing of the disc at C4-5 with anterior syndesmosises at the remaining 3 levels above the fusion site. No acute findings.  ?  ?XR Lumbar Spine 2-3 Views ?  ?Result Date: 09/03/2021 ?AP and lateral flexion and extension radiographs show ray anterior cage fusions at the L4-5 and L5-S1 levels With small clips from previous anterior approach to fusion. With flexion and extension there is no motion across these Levels with anterior sentinel sign ov bone bridging. The L3-4 level has a spondylolisthesis that is grade 1 and increases from 3-4 mm to nearly 6-7 mm. There is disc height narrowing at this level and endplate sclerosis.   ?  ?PATIENT SURVEYS:  ?FOTO 0 ?  ?SCREENING FOR RED FLAGS: ?Bowel or bladder incontinence: No ?  ?COGNITION: ?         Overall cognitive status: Within functional limits for tasks assessed              ?          ?SENSATION: ?         Light touch: Appears intact ?          ?  ?MUSCLE LENGTH: ?Hamstrings: Right 70 deg; Left 45 deg ?Thomas test: UTA ?  ?POSTURE:  ?Rounded shoulders  ?  ?PALPATION: ?TTP along paraspinals ?  ?SENSATION: ?Light touch: Appears intact ?  ?  ?  ?CERVICAL AROM/PROM ?  ?A/PROM A/PROM (deg) ?09/23/2021  ?Flexion 20  ?Extension 35  ?Right lateral flexion    ?Left lateral flexion    ?Right rotation 40  ?Left rotation 50  ? (Blank rows = not tested) ?  ?UE  AROM/PROM: ?  ?A/PROM Right ?09/23/2021 Left ?09/23/2021  ?Shoulder flexion [facility] WFL  ?Shoulder extension      ?Shoulder abduction [facility] WFL  ?Shoulder adduction      ?Shoulder extension      ?Shoulder internal rotation      ?Shoulder external rotation [facility] WFL  ?Elbow flexion [facility] WFL  ?Elbow extension [facility] WFL  ?Wrist flexion [facility] WFL  ?Wrist extension [facility] WFL  ?Wrist ulnar deviation      ?Wrist radial deviation      ?Wrist pronation      ?Wrist supination      ? (Blank  rows = not tested) ?  ?UE MMT: ?  ?MMT Right ?09/23/2021 Left ?09/23/2021  ?Shoulder flexion 5 5  ?Shoulder extension      ?Shoulder abduction 5 5  ?Shoulder adduction      ?Shoulder extension      ?Middle trapezius      ?Lower trapezius      ?Elbow flexion 5 5  ?Elbow extension 5 5  ?Wrist flexion 5 5  ?Wrist extension 5 5  ?Wrist ulnar deviation      ?Wrist radial deviation      ?Wrist pronation      ?Wrist supination      ?Grip strength      ? (Blank rows = not tested) ?  ?CERVICAL SPECIAL TESTS:  ?NT due to fusion ?  ?FUNCTIONAL TESTS:  ?5 times sit to stand: tbd   ?LUMBARAROM/PROM ?  ?A/PROM A/PROM  ?09/23/2021  ?Flexion 10%  ?Extension 10%  ?Right lateral flexion 25%  ?Left lateral flexion 25%  ?Right rotation NT  ?Left rotation NT  ? (Blank rows = not tested) ?  ?LE AROM/PROM: ?  ?A/PROM Right ?09/23/2021 Left ?09/23/2021  ?Hip flexion 100 100  ?Hip extension 0 0  ?Hip abduction      ?Hip adduction      ?Hip internal rotation      ?Hip external rotation      ?Knee flexion [facility] WFL  ?Knee extension [facility] WFL  ?Ankle dorsiflexion      ?Ankle plantarflexion      ?Ankle inversion      ?Ankle eversion      ? (Blank rows = not tested) ?  ?LE MMT: ?  ?MMT Right ?09/23/2021 Left ?09/23/2021  ?Hip flexion      ?Hip extension 4 4  ?Hip abduction      ?Hip adduction      ?Hip internal rotation      ?Hip external rotation      ?Knee flexion      ?Knee extension 4 4  ?Ankle dorsiflexion      ?Ankle plantarflexion      ?Ankle inversion      ?Ankle eversion      ? (Blank  rows = not tested) ?  ?LUMBAR SPECIAL TESTS:  ?Not tested due to multi-level fusion ?  ?  ?  ?GAIT: ?Distance walked: 150 ?Assistive device utilized: None ?Level of assistance: Modified independence ?Comment

## 2021-10-30 ENCOUNTER — Encounter: Payer: Medicare Other | Admitting: Physical Medicine and Rehabilitation

## 2021-11-04 ENCOUNTER — Emergency Department (HOSPITAL_COMMUNITY)
Admission: EM | Admit: 2021-11-04 | Discharge: 2021-11-04 | Disposition: A | Discharge status: Home / Self Care | Payer: Medicare Other | Attending: Emergency Medicine | Admitting: Emergency Medicine

## 2021-11-04 ENCOUNTER — Other Ambulatory Visit: Payer: Self-pay

## 2021-11-04 DIAGNOSIS — R1110 Vomiting, unspecified: Secondary | ICD-10-CM | POA: Insufficient documentation

## 2021-11-04 DIAGNOSIS — E119 Type 2 diabetes mellitus without complications: Secondary | ICD-10-CM | POA: Insufficient documentation

## 2021-11-04 DIAGNOSIS — I10 Essential (primary) hypertension: Secondary | ICD-10-CM | POA: Diagnosis not present

## 2021-11-04 DIAGNOSIS — R109 Unspecified abdominal pain: Secondary | ICD-10-CM | POA: Diagnosis present

## 2021-11-04 DIAGNOSIS — Z794 Long term (current) use of insulin: Secondary | ICD-10-CM | POA: Insufficient documentation

## 2021-11-04 DIAGNOSIS — Z7982 Long term (current) use of aspirin: Secondary | ICD-10-CM | POA: Insufficient documentation

## 2021-11-04 DIAGNOSIS — R1013 Epigastric pain: Secondary | ICD-10-CM | POA: Diagnosis not present

## 2021-11-04 DIAGNOSIS — F1290 Cannabis use, unspecified, uncomplicated: Secondary | ICD-10-CM | POA: Insufficient documentation

## 2021-11-04 DIAGNOSIS — Z7984 Long term (current) use of oral hypoglycemic drugs: Secondary | ICD-10-CM | POA: Diagnosis not present

## 2021-11-04 DIAGNOSIS — R112 Nausea with vomiting, unspecified: Secondary | ICD-10-CM

## 2021-11-04 DIAGNOSIS — Z79899 Other long term (current) drug therapy: Secondary | ICD-10-CM | POA: Diagnosis not present

## 2021-11-04 LAB — CBC WITH DIFFERENTIAL/PLATELET
Abs Immature Granulocytes: 0.05 10*3/uL (ref 0.00–0.07)
Basophils Absolute: 0 10*3/uL (ref 0.0–0.1)
Basophils Relative: 0 %
Eosinophils Absolute: 0 10*3/uL (ref 0.0–0.5)
Eosinophils Relative: 0 %
HCT: 46.8 % — ABNORMAL HIGH (ref 36.0–46.0)
Hemoglobin: 15.5 g/dL — ABNORMAL HIGH (ref 12.0–15.0)
Immature Granulocytes: 0 %
Lymphocytes Relative: 8 %
Lymphs Abs: 1 10*3/uL (ref 0.7–4.0)
MCH: 28.2 pg (ref 26.0–34.0)
MCHC: 33.1 g/dL (ref 30.0–36.0)
MCV: 85.2 fL (ref 80.0–100.0)
Monocytes Absolute: 0.8 10*3/uL (ref 0.1–1.0)
Monocytes Relative: 7 %
Neutro Abs: 10.4 10*3/uL — ABNORMAL HIGH (ref 1.7–7.7)
Neutrophils Relative %: 85 %
Platelets: 367 10*3/uL (ref 150–400)
RBC: 5.49 MIL/uL — ABNORMAL HIGH (ref 3.87–5.11)
RDW: 13.7 % (ref 11.5–15.5)
WBC: 12.2 10*3/uL — ABNORMAL HIGH (ref 4.0–10.5)
nRBC: 0 % (ref 0.0–0.2)

## 2021-11-04 LAB — COMPREHENSIVE METABOLIC PANEL
ALT: 21 U/L (ref 0–44)
AST: 19 U/L (ref 15–41)
Albumin: 4.1 g/dL (ref 3.5–5.0)
Alkaline Phosphatase: 128 U/L — ABNORMAL HIGH (ref 38–126)
Anion gap: 13 (ref 5–15)
BUN: 7 mg/dL (ref 6–20)
CO2: 23 mmol/L (ref 22–32)
Calcium: 9.2 mg/dL (ref 8.9–10.3)
Chloride: 99 mmol/L (ref 98–111)
Creatinine, Ser: 0.64 mg/dL (ref 0.44–1.00)
GFR, Estimated: >60 mL/min (ref >60)
Glucose, Bld: 306 mg/dL — ABNORMAL HIGH (ref 70–99)
Potassium: 4 mmol/L (ref 3.5–5.1)
Sodium: 135 mmol/L (ref 135–145)
Total Bilirubin: 0.6 mg/dL (ref 0.3–1.2)
Total Protein: 7.3 g/dL (ref 6.5–8.1)

## 2021-11-04 LAB — RAPID URINE DRUG SCREEN, HOSP PERFORMED
Amphetamines: NOT DETECTED
Barbiturates: NOT DETECTED
Benzodiazepines: POSITIVE — AB
Cocaine: NOT DETECTED
Opiates: POSITIVE — AB
Tetrahydrocannabinol: POSITIVE — AB

## 2021-11-04 LAB — URINALYSIS, ROUTINE W REFLEX MICROSCOPIC
Bacteria, UA: NONE SEEN
Bilirubin Urine: NEGATIVE
Glucose, UA: >=500 mg/dL — AB
Ketones, ur: 20 mg/dL — AB
Leukocytes,Ua: NEGATIVE
Nitrite: NEGATIVE
Protein, ur: NEGATIVE mg/dL
Specific Gravity, Urine: 1.018 (ref 1.005–1.030)
pH: 5 (ref 5.0–8.0)

## 2021-11-04 LAB — LIPASE, BLOOD: Lipase: 38 U/L (ref 11–51)

## 2021-11-04 MED ORDER — MORPHINE SULFATE (PF) 4 MG/ML IV SOLN
4.0000 mg | Freq: Once | INTRAVENOUS | Status: AC
  Administered 2021-11-04: 4 mg via INTRAVENOUS
  Filled 2021-11-04: qty 1

## 2021-11-04 MED ORDER — DROPERIDOL 2.5 MG/ML IJ SOLN
1.2500 mg | Freq: Once | INTRAMUSCULAR | Status: AC
  Administered 2021-11-04: 1.25 mg via INTRAVENOUS
  Filled 2021-11-04: qty 2

## 2021-11-04 MED ORDER — ONDANSETRON HCL 4 MG/2ML IJ SOLN
4.0000 mg | Freq: Once | INTRAMUSCULAR | Status: AC
  Administered 2021-11-04: 4 mg via INTRAVENOUS
  Filled 2021-11-04: qty 2

## 2021-11-04 MED ORDER — SODIUM CHLORIDE 0.9 % IV BOLUS
1000.0000 mL | Freq: Once | INTRAVENOUS | Status: AC
  Administered 2021-11-04: 1000 mL via INTRAVENOUS

## 2021-11-04 NOTE — ED Triage Notes (Signed)
Abd pain  x several days with nausea and vomiting ?

## 2021-11-04 NOTE — Discharge Instructions (Signed)
Stop using marijuana. ?

## 2021-11-04 NOTE — ED Provider Notes (Signed)
?[facility] ?Provider Note ? ? ?CSN: [id number] ?Arrival date & time: 11/04/21  1638 ? ?  ? ?History ? ?Chief Complaint  ?Patient presents with  ? Abdominal Pain  ? ? ?[name] is a 56 y.o. female. ? ?Pt is a 56 yo female with a pmhx significant for dm, htn, pancreatitis, fibromyalgia, gerd, depression, arthritis, and anxiety.  Pt was admitted from 3/8-11 for worsening abd pain.  Repeat CT scan did not reveal any source of pain.  She was seen by GI.  She has had extensive GI work up and there are no plans for additional work up now.  She said her abdomen has never stopped hurting.  She is a poor historian. ? ? ?  ? ?Home Medications ?Prior to Admission medications   ?Medication Sig Start Date End Date Taking? Authorizing Provider  ?acetaminophen (TYLENOL) 500 MG tablet Take 500 mg by mouth 2 (two) times daily as needed for moderate pain.    [provider]  ?albuterol (PROVENTIL HFA;VENTOLIN HFA) 108 (90 BASE) MCG/ACT inhaler Inhale 2 puffs into the lungs every 4 (four) hours as needed for wheezing.    [provider]  ?albuterol (PROVENTIL) (2.5 MG/3ML) 0.083% nebulizer solution Take 3 mLs (2.5 mg total) by nebulization every 6 (six) hours as needed for wheezing or shortness of breath. 12/12/19   Ok Edwards, PA-C  ?ALPRAZolam (XANAX) 1 MG tablet Take 1 mg by mouth daily as needed for anxiety. 10/23/19   [provider]  ?amLODipine-olmesartan (AZOR) 5-40 MG tablet Take 1 tablet by mouth daily. 06/05/21   [provider]  ?aspirin EC 81 MG tablet Take 81 mg by mouth daily. Swallow whole.    [provider]  ?cetirizine (ZYRTEC) 10 MG tablet Take 10 mg by mouth daily as needed for allergies. 09/27/20   [provider]  ?CREON 36000-114000 units CPEP capsule Take 36,000 Units by mouth daily. 10/16/21   [provider]  ?cyclobenzaprine (FLEXERIL) 10 MG tablet Take 1 tablet (10 mg total) by mouth 2 (two) times  daily as needed for muscle spasms. 11/27/20   Jessy Oto, MD  ?dicyclomine (BENTYL) 10 MG capsule Take 10 mg by mouth daily. 10/16/21   [provider]  ?escitalopram (LEXAPRO) 20 MG tablet Take 20 mg by mouth daily. 08/10/20   [provider]  ?fluticasone (FLONASE) 50 MCG/ACT nasal spray Place 1 spray into both nostrils daily.    [provider]  ?HYDROcodone-acetaminophen (NORCO/VICODIN) 5-325 MG tablet Take 1 tablet by mouth every 6 (six) hours as needed for moderate pain. 09/14/21   Mariel Aloe, MD  ?insulin glargine (LANTUS) 100 unit/mL SOPN Inject 60 Units into the skin at bedtime.    [provider]  ?loperamide (IMODIUM) 2 MG capsule Take 1 capsule (2 mg total) by mouth 4 (four) times daily as needed for diarrhea or loose stools. 09/09/21   Hayden Rasmussen, MD  ?metFORMIN (GLUCOPHAGE) 1000 MG tablet Take 1,000 mg by mouth 2 (two) times daily. 03/26/21   [provider]  ?metoprolol succinate (TOPROL-XL) 50 MG 24 hr tablet Take 50 mg by mouth daily. 07/21/14   [provider]  ?ondansetron (ZOFRAN) 4 MG tablet Take 1 tablet (4 mg total) by mouth every 6 (six) hours. ?Patient taking differently: Take 4 mg by mouth every 8 (eight) hours as needed for vomiting or nausea. 10/15/21   Valarie Merino, MD  ?phentermine (ADIPEX-P) 37.5 MG tablet Take 18.75  mg by mouth daily as needed (weight gain). 09/28/21   [provider]  ?PRESCRIPTION MEDICATION Inject 60 Units into the skin daily. As per patient doctor changed her insulin on 10/16/21. Patient got samples from doctors office; not sure about the name.    [provider]  ?simvastatin (ZOCOR) 20 MG tablet Take 20 mg by mouth daily. 08/22/20   [provider]  ?sitaGLIPtin (JANUVIA) 100 MG tablet Take 100 mg by mouth daily.    [provider]  ?traMADol (ULTRAM) 50 MG tablet Take 50 mg by mouth every 6 (six) hours as needed for moderate pain.    [provider]   ?promethazine (PHENERGAN) 25 MG tablet Take 1 tablet (25 mg total) by mouth every 6 (six) hours as needed for nausea or vomiting. ?Patient not taking: No sig reported 08/15/20 10/19/20  Wieters, Elesa Hacker, PA-C  ?   ? ?Allergies    ?Patient has no allergy information on record.   ? ?Review of Systems   ?Review of Systems  ?Gastrointestinal:  Positive for abdominal pain, nausea and vomiting.  ?All other systems reviewed and are negative. ? ?Physical Exam ?Updated Vital Signs ?BP (!) 176/105   Pulse (!) 101   Resp 18   Ht '5\' 4"'$  (1.626 m)   Wt 97 kg   SpO2 95%   BMI 36.71 kg/m?  ?Physical Exam ?Vitals and nursing note reviewed.  ?Constitutional:   ?   Appearance: She is well-developed.  ?HENT:  ?   Head: Normocephalic and atraumatic.  ?   Mouth/Throat:  ?   Mouth: Mucous membranes are moist.  ?   Pharynx: Oropharynx is clear.  ?Eyes:  ?   Extraocular Movements: Extraocular movements intact.  ?   Pupils: Pupils are equal, round, and reactive to light.  ?Cardiovascular:  ?   Rate and Rhythm: Normal rate and regular rhythm.  ?   Heart sounds: Normal heart sounds.  ?Pulmonary:  ?   Effort: Pulmonary effort is normal.  ?   Breath sounds: Normal breath sounds.  ?Abdominal:  ?   General: Abdomen is flat. Bowel sounds are normal.  ?   Palpations: Abdomen is soft.  ?   Tenderness: There is abdominal tenderness in the epigastric area.  ?Skin: ?   General: Skin is warm.  ?   Capillary Refill: Capillary refill takes less than 2 seconds.  ?Neurological:  ?   General: No focal deficit present.  ?   Mental Status: She is alert and oriented to person, place, and time.  ?Psychiatric:     ?   Mood and Affect: Mood normal.     ?   Behavior: Behavior normal.  ? ? ?ED Results / Procedures / Treatments   ?Labs ?(all labs ordered are listed, but only abnormal results are displayed) ?Labs Reviewed  ?CBC WITH DIFFERENTIAL/PLATELET - Abnormal; Notable for the following components:  ?    Result Value  ? WBC 12.2 (*)   ? RBC 5.49 (*)   ?  Hemoglobin 15.5 (*)   ? HCT 46.8 (*)   ? Neutro Abs 10.4 (*)   ? All other components within normal limits  ?COMPREHENSIVE METABOLIC PANEL - Abnormal; Notable for the following components:  ? Glucose, Bld 306 (*)   ? Alkaline Phosphatase 128 (*)   ? All other components within normal limits  ?URINALYSIS, ROUTINE W REFLEX MICROSCOPIC - Abnormal; Notable for the following components:  ? Color, Urine STRAW (*)   ? Glucose, UA >=  500 (*)   ? Hgb urine dipstick MODERATE (*)   ? Ketones, ur 20 (*)   ? All other components within normal limits  ?RAPID URINE DRUG SCREEN, HOSP PERFORMED - Abnormal; Notable for the following components:  ? Opiates POSITIVE (*)   ? Benzodiazepines POSITIVE (*)   ? Tetrahydrocannabinol POSITIVE (*)   ? All other components within normal limits  ?LIPASE, BLOOD  ? ? ?EKG ?None ? ?Radiology ?No results found. ? ?Procedures ?Procedures  ? ? ?Medications Ordered in ED ?Medications  ?sodium chloride 0.9 % bolus 1,000 mL (0 mLs Intravenous Stopped 11/04/21 2024)  ?ondansetron [facility]) injection 4 mg (4 mg Intravenous Given 11/04/21 1706)  ?morphine (PF) 4 MG/ML injection 4 mg (4 mg Intravenous Given 11/04/21 1708)  ?droperidol (INAPSINE) 2.5 MG/ML injection 1.25 mg (1.25 mg Intravenous Given 11/04/21 2113)  ? ? ?ED Course/ Medical Decision Making/ A&P ?  ?                        ?Medical Decision Making ?Amount and/or Complexity of Data Reviewed ?Labs: ordered. ? ?Risk ?Prescription drug management. ? ? ?This patient presents to the ED for concern of abd pain, this involves an extensive number of treatment options, and is a complaint that carries with it a high risk of complications and morbidity.  The differential diagnosis includes chronic pain, pancreatitis, appendicitis, uti ? ? ?Co morbidities that complicate the patient evaluation ? ?dm, htn, pancreatitis, fibromyalgia, gerd, depression, arthritis, and anxiety ? ? ?Additional history obtained: ? ?Additional history obtained from epic chart  review ? ? ?Lab Tests: ? ?I Ordered, and personally interpreted labs.  The pertinent results include:  uds + for opiates, benzos and mj, ua neg there than ketones, cbc nl, cmp nl ? ? ? ?Cardiac Monitoring: ? ?The patient was maint

## 2021-11-04 NOTE — ED Notes (Signed)
Pt ambulated to the BR.

## 2021-11-14 ENCOUNTER — Encounter: Payer: Medicare Other | Admitting: Physical Medicine and Rehabilitation

## 2021-11-19 ENCOUNTER — Ambulatory Visit: Payer: Medicare Other | Admitting: Nutrition

## 2021-12-02 ENCOUNTER — Other Ambulatory Visit: Payer: Self-pay

## 2021-12-02 ENCOUNTER — Emergency Department (HOSPITAL_COMMUNITY)
Admission: EM | Admit: 2021-12-02 | Discharge: 2021-12-03 | Discharge status: Against Medical Advice | Payer: Medicare Other | Attending: Emergency Medicine | Admitting: Emergency Medicine

## 2021-12-02 ENCOUNTER — Encounter (HOSPITAL_COMMUNITY): Payer: Self-pay | Admitting: Emergency Medicine

## 2021-12-02 DIAGNOSIS — R1084 Generalized abdominal pain: Secondary | ICD-10-CM | POA: Diagnosis present

## 2021-12-02 DIAGNOSIS — Z5321 Procedure and treatment not carried out due to patient leaving prior to being seen by health care provider: Secondary | ICD-10-CM | POA: Insufficient documentation

## 2021-12-02 DIAGNOSIS — R112 Nausea with vomiting, unspecified: Secondary | ICD-10-CM | POA: Insufficient documentation

## 2021-12-02 DIAGNOSIS — R197 Diarrhea, unspecified: Secondary | ICD-10-CM | POA: Diagnosis not present

## 2021-12-02 LAB — COMPREHENSIVE METABOLIC PANEL
ALT: 20 U/L (ref 0–44)
AST: 19 U/L (ref 15–41)
Albumin: 4.3 g/dL (ref 3.5–5.0)
Alkaline Phosphatase: 109 U/L (ref 38–126)
Anion gap: 13 (ref 5–15)
BUN: 17 mg/dL (ref 6–20)
CO2: 20 mmol/L — ABNORMAL LOW (ref 22–32)
Calcium: 9.6 mg/dL (ref 8.9–10.3)
Chloride: 97 mmol/L — ABNORMAL LOW (ref 98–111)
Creatinine, Ser: 0.81 mg/dL (ref 0.44–1.00)
GFR, Estimated: >60 mL/min (ref >60)
Glucose, Bld: 322 mg/dL — ABNORMAL HIGH (ref 70–99)
Potassium: 4 mmol/L (ref 3.5–5.1)
Sodium: 130 mmol/L — ABNORMAL LOW (ref 135–145)
Total Bilirubin: 1.1 mg/dL (ref 0.3–1.2)
Total Protein: 7.8 g/dL (ref 6.5–8.1)

## 2021-12-02 LAB — I-STAT BETA HCG BLOOD, ED (MC, WL, AP ONLY): I-stat hCG, quantitative: <5 m[IU]/mL (ref <5)

## 2021-12-02 LAB — URINALYSIS, ROUTINE W REFLEX MICROSCOPIC
Bilirubin Urine: NEGATIVE
Glucose, UA: >=500 mg/dL — AB
Hgb urine dipstick: NEGATIVE
Ketones, ur: 80 mg/dL — AB
Leukocytes,Ua: NEGATIVE
Nitrite: NEGATIVE
Protein, ur: NEGATIVE mg/dL
Specific Gravity, Urine: 1.027 (ref 1.005–1.030)
pH: 5 (ref 5.0–8.0)

## 2021-12-02 LAB — CBC
HCT: 48.3 % — ABNORMAL HIGH (ref 36.0–46.0)
Hemoglobin: 16.4 g/dL — ABNORMAL HIGH (ref 12.0–15.0)
MCH: 28.1 pg (ref 26.0–34.0)
MCHC: 34 g/dL (ref 30.0–36.0)
MCV: 82.8 fL (ref 80.0–100.0)
Platelets: 404 10*3/uL — ABNORMAL HIGH (ref 150–400)
RBC: 5.83 MIL/uL — ABNORMAL HIGH (ref 3.87–5.11)
RDW: 13.7 % (ref 11.5–15.5)
WBC: 11 10*3/uL — ABNORMAL HIGH (ref 4.0–10.5)
nRBC: 0 % (ref 0.0–0.2)

## 2021-12-02 LAB — LIPASE, BLOOD: Lipase: 30 U/L (ref 11–51)

## 2021-12-02 NOTE — ED Triage Notes (Signed)
Pt with progressively worsening diffuse abdominal pain with n/v/d since Wednesday.   ?

## 2021-12-02 NOTE — ED Notes (Addendum)
Pt left. Pt stated she couldn't wait any longer. Pt was advised to stay and be seen. Pts daughter is already here to pick her up.  ?

## 2021-12-27 ENCOUNTER — Ambulatory Visit
Admission: EM | Admit: 2021-12-27 | Discharge: 2021-12-27 | Disposition: A | Discharge status: Home / Self Care | Payer: Medicare Other | Attending: Family Medicine | Admitting: Family Medicine

## 2021-12-27 DIAGNOSIS — L02214 Cutaneous abscess of groin: Secondary | ICD-10-CM

## 2021-12-27 MED ORDER — KETOROLAC TROMETHAMINE 30 MG/ML IJ SOLN
30.0000 mg | Freq: Once | INTRAMUSCULAR | Status: AC
  Administered 2021-12-27: 30 mg via INTRAMUSCULAR

## 2021-12-27 MED ORDER — IBUPROFEN 800 MG PO TABS: 800.0000 mg | ORAL_TABLET | Freq: Three times a day (TID) | ORAL | 0 refills | Status: DC | PRN

## 2021-12-27 MED ORDER — AMOXICILLIN-POT CLAVULANATE 875-125 MG PO TABS: 1.0000 | ORAL_TABLET | Freq: Two times a day (BID) | ORAL | 0 refills | Status: AC

## 2021-12-27 NOTE — ED Triage Notes (Signed)
Patient presents to Urgent Care with complaints of boil on L inner thigh since Monday. Patient reports she has never had boil before, pt reports pain, hot compresses and popped this morning and the drainage was red and yellow and stunk.

## 2021-12-27 NOTE — Discharge Instructions (Addendum)
You have been given a shot of Toradol 30 mg today.   Take ibuprofen 800 mg--1 tab every 8 hours as needed for pain.   Take amoxicillin-clavulanate 875 mg--1 tab twice daily with food for 7 days   Wash the area with warm soapy water 2-3 times daily.

## 2021-12-27 NOTE — ED Provider Notes (Signed)
[facility]    CSN: [id number] Arrival date & time: 12/27/21  1093      History   Chief Complaint Chief Complaint  Patient presents with   Rash    HPI [name] is a 56 y.o. female.    Rash Here for redness/swelling and pain that has been developing since 5/14. She has been putting warm compresses on the area.  It did drain a little bit this morning, but is still very painful.  No vomiting or fever.  She does have a history of diabetes and sugars are usually in the 250s but in the last day or 2 they have been in the 300 range.  Past Medical History:  Diagnosis Date   Anxiety    Arthritis    rheumatoid , osteoarthritis   Bronchitis    Chronic back pain    Depression    Diabetes mellitus    Fatty liver    Fibromyalgia    GERD (gastroesophageal reflux disease)    Headache    Hypertension    Pancreatitis    Pneumonia    Rotator cuff tear, right     Patient Active Problem List   Diagnosis Date Noted   Adrenal adenoma, left 09/13/2021   Intractable nausea and vomiting 09/12/2021   Anxiety 09/12/2021   Gastroenteritis 09/09/2021   Diabetes mellitus type 2 in obese ([place]) 09/09/2021   Class 2 obesity due to excess calories with body mass index (BMI) of 36.0 to 36.9 in adult 09/09/2021   Marijuana abuse 09/09/2021   Tobacco dependence 09/09/2021   Essential hypertension 05/29/2020   Abdominal pain 11/23/2019   Acute pancreatitis 11/21/2019   De Quervain's syndrome (tenosynovitis) 07/06/2018   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   Complete tear of right rotator cuff 07/22/2017    Class: Chronic   Retained orthopedic hardware 07/22/2017    Class: Chronic   Cervical disc herniation 07/21/2017    Class: Acute   Status post cervical spinal fusion 07/21/17 07/21/2017   Chronic back pain     Past Surgical History:  Procedure Laterality Date   ABDOMINAL HYSTERECTOMY     ANTERIOR CERVICAL DECOMP/DISCECTOMY FUSION N/A 07/21/2017   Procedure:  ANTERIOR CERVICAL DISCECTOMY AND FUSION C6-7, REMOVAL OF SCREW C6 PLATE, DEPUY ZERO-P IMPLANT, LOCAL BONE GRAFT, ALLOGRAFT BONE GRAFT, VIVIGEN;  Surgeon: Jessy Oto, MD;  Location: [facility];  Service: Orthopedics;  Laterality: N/A;   BACK SURGERY     laminectomy   BONE GRAFT HIP ILIAC CREST     BREAST BIOPSY     CARPAL TUNNEL RELEASE     CERVICAL DISCECTOMY  07/21/2017   CESAREAN SECTION     CHOLECYSTECTOMY     COLONOSCOPY     frontal fusion     neck fusion     patient reported   SHOULDER ARTHROSCOPY WITH SUBACROMIAL DECOMPRESSION, ROTATOR CUFF REPAIR AND BICEP TENDON REPAIR Right 10/31/2017   Procedure: RIGHT SHOULDER ARTHROSCOPY, MANIPULATION UNDER ANESTHESIA, BICEPS TENODESIS, AND MINI OPEN ROTATOR CUFF TEAR REPAIR;  Surgeon: Meredith Pel, MD;  Location: [facility];  Service: Orthopedics;  Laterality: Right;    OB History   No obstetric history on file.      Home Medications    Prior to Admission medications   Medication Sig Start Date End Date Taking? Authorizing Provider  amoxicillin-clavulanate (AUGMENTIN) 875-125 MG tablet Take 1 tablet by mouth 2 (two) times daily for 7 days. 12/27/21 01/03/22 Yes Barrett Henle, MD  ibuprofen (ADVIL) 800 MG tablet  Take 1 tablet (800 mg total) by mouth every 8 (eight) hours as needed (pain). 12/27/21  Yes Barrett Henle, MD  acetaminophen (TYLENOL) 500 MG tablet Take 500 mg by mouth 2 (two) times daily as needed for moderate pain.    [provider]  albuterol (PROVENTIL HFA;VENTOLIN HFA) 108 (90 BASE) MCG/ACT inhaler Inhale 2 puffs into the lungs every 4 (four) hours as needed for wheezing.    [provider]  albuterol (PROVENTIL) (2.5 MG/3ML) 0.083% nebulizer solution Take 3 mLs (2.5 mg total) by nebulization every 6 (six) hours as needed for wheezing or shortness of breath. 12/12/19   Tasia Catchings, Amy V, PA-C  ALPRAZolam Duanne Moron) 1 MG tablet Take 1 mg by mouth daily as needed for anxiety. 10/23/19   [provider]   amLODipine-olmesartan (AZOR) 5-40 MG tablet Take 1 tablet by mouth daily. 06/05/21   [provider]  aspirin EC 81 MG tablet Take 81 mg by mouth daily. Swallow whole.    [provider]  cetirizine (ZYRTEC) 10 MG tablet Take 10 mg by mouth daily as needed for allergies. 09/27/20   [provider]  CREON 36000-114000 units CPEP capsule Take 36,000 Units by mouth daily. Patient not taking: Reported on 12/27/2021 10/16/21   [provider]  cyclobenzaprine (FLEXERIL) 10 MG tablet Take 1 tablet (10 mg total) by mouth 2 (two) times daily as needed for muscle spasms. 11/27/20   Jessy Oto, MD  dicyclomine (BENTYL) 10 MG capsule Take 10 mg by mouth daily. 10/16/21   [provider]  escitalopram (LEXAPRO) 20 MG tablet Take 20 mg by mouth daily. 08/10/20   [provider]  fluticasone (FLONASE) 50 MCG/ACT nasal spray Place 1 spray into both nostrils daily.    [provider]  HYDROcodone-acetaminophen (NORCO/VICODIN) 5-325 MG tablet Take 1 tablet by mouth every 6 (six) hours as needed for moderate pain. 09/14/21   Mariel Aloe, MD  insulin glargine (LANTUS) 100 unit/mL SOPN Inject 60 Units into the skin at bedtime.    [provider]  loperamide (IMODIUM) 2 MG capsule Take 1 capsule (2 mg total) by mouth 4 (four) times daily as needed for diarrhea or loose stools. 09/09/21   Hayden Rasmussen, MD  metFORMIN (GLUCOPHAGE) 1000 MG tablet Take 1,000 mg by mouth 2 (two) times daily. Patient not taking: Reported on 12/27/2021 03/26/21   [provider]  metoprolol succinate (TOPROL-XL) 50 MG 24 hr tablet Take 50 mg by mouth daily. 07/21/14   [provider]  ondansetron (ZOFRAN) 4 MG tablet Take 1 tablet (4 mg total) by mouth every 6 (six) hours. Patient taking differently: Take 4 mg by mouth every 8 (eight) hours as needed for vomiting or nausea. 10/15/21   Valarie Merino, MD  phentermine (ADIPEX-P) 37.5 MG tablet Take 18.75  mg by mouth daily as needed (weight gain). 09/28/21   [provider]  PRESCRIPTION MEDICATION Inject 60 Units into the skin daily. As per patient doctor changed her insulin on 10/16/21. Patient got samples from doctors office; not sure about the name.    [provider]  simvastatin (ZOCOR) 20 MG tablet Take 20 mg by mouth daily. 08/22/20   [provider]  sitaGLIPtin (JANUVIA) 100 MG tablet Take 100 mg by mouth daily.    [provider]  traMADol (ULTRAM) 50 MG tablet Take 50 mg by mouth every 6 (six) hours as needed for moderate pain.    [provider]  promethazine (  PHENERGAN) 25 MG tablet Take 1 tablet (25 mg total) by mouth every 6 (six) hours as needed for nausea or vomiting. Patient not taking: No sig reported 08/15/20 10/19/20  Wieters, Elesa Hacker, PA-C    Family History Family History  Problem Relation Age of Onset   Hypertension Father    Renal Disease Father    Diabetes Mother    Hypertension Mother    Edema Mother    Diabetes Sister    Diabetes Brother     Social History Social History   Tobacco Use   Smoking status: Every Day    Packs/day: 0.50    Years: 40.00    Pack years: 20.00    Types: Cigarettes   Smokeless tobacco: Never  Vaping Use   Vaping Use: Never used  Substance Use Topics   Alcohol use: No   Drug use: Yes    Types: Marijuana    Comment: 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   Review of Systems Review of Systems  Skin:  Positive for rash.    Physical Exam Triage Vital Signs ED Triage Vitals  Enc Vitals Group     BP 12/27/21 0847 139/81     Pulse Rate 12/27/21 0847 (!) 109     Resp 12/27/21 0847 20     Temp 12/27/21 0847 97.7 F (36.5 C)     Temp Source 12/27/21 0847 Oral     SpO2 12/27/21 0847 94 %     Weight --      Height --      Head Circumference --      Peak Flow --      Pain  Score 12/27/21 0843 9     Pain Loc --      Pain Edu? --      Excl. in [place]? --    No data found.  Updated Vital Signs BP 139/81 (BP Location: Right Arm)   Pulse (!) 109   Temp 97.7 F (36.5 C) (Oral)   Resp 20   SpO2 94%   Visual Acuity Right Eye Distance:   Left Eye Distance:   Bilateral Distance:    Right Eye Near:   Left Eye Near:    Bilateral Near:     Physical Exam Constitutional:      General: She is not in acute distress.    Appearance: She is not ill-appearing, toxic-appearing or diaphoretic.  Cardiovascular:     Rate and Rhythm: Normal rate and regular rhythm.  Pulmonary:     Effort: Pulmonary effort is normal.     Breath sounds: Normal breath sounds.  Skin:    Coloration: Skin is not jaundiced or pale.     Comments: She has an area of swelling and tenderness and erythema about 2 cm x 1 and half centimeters in her left inguinal area.  It does have an area of fluctuance.  Neurological:     Mental Status: She is alert and oriented to person, place, and time.  Psychiatric:        Behavior: Behavior normal.     UC Treatments / Results  Labs (all labs ordered are listed, but only abnormal results are displayed) Labs Reviewed - No data to display  EKG   Radiology No results found.  Procedures Procedures (including critical care time)  Medications Ordered in UC Medications  ketorolac (TORADOL) 30 MG/ML  injection 30 mg (30 mg Intramuscular Given 12/27/21 0939)    Initial Impression / Assessment and Plan / UC Course  I have reviewed the triage vital signs and the nursing notes.  Pertinent labs & imaging results that were available during my care of the patient were reviewed by me and considered in my medical decision making (see chart for details).     After informed consent was obtained verbally, 1% lidocaine was used to place some infiltration anesthesia in the roof of the abscess.  The area was cleansed with Betadine.  #11 blade was used to make a  stab wound in the roof of the abscess.  Approximately 3 mL of purulent drainage was obtained.  Wick of iodoform gauze 1/4 inch was placed in the wound and gauze was placed over it and her underwear was used to hold the gauze in place.  Wound care is asked planed  No complication  EBL approximately 2 mL.  Final Clinical Impressions(s) / UC Diagnoses   Final diagnoses:  Inguinal abscess     Discharge Instructions      You have been given a shot of Toradol 30 mg today.   Take ibuprofen 800 mg--1 tab every 8 hours as needed for pain.   Take amoxicillin-clavulanate 875 mg--1 tab twice daily with food for 7 days   Wash the area with warm soapy water 2-3 times daily.    ED Prescriptions     Medication Sig Dispense Auth. Provider   amoxicillin-clavulanate (AUGMENTIN) 875-125 MG tablet Take 1 tablet by mouth 2 (two) times daily for 7 days. 14 tablet Drew Lips, Gwenlyn Perking, MD   ibuprofen (ADVIL) 800 MG tablet Take 1 tablet (800 mg total) by mouth every 8 (eight) hours as needed (pain). 21 tablet Ladora Osterberg, Gwenlyn Perking, MD      PDMP not reviewed this encounter.   Barrett Henle, MD 12/27/21 [phone]

## 2022-01-02 ENCOUNTER — Other Ambulatory Visit: Payer: Self-pay | Admitting: Internal Medicine

## 2022-01-03 LAB — C. TRACHOMATIS/N. GONORRHOEAE RNA
C. trachomatis RNA, TMA: NOT DETECTED
N. gonorrhoeae RNA, TMA: NOT DETECTED

## 2022-02-09 ENCOUNTER — Other Ambulatory Visit: Payer: Self-pay | Admitting: Specialist

## 2022-06-02 ENCOUNTER — Emergency Department (HOSPITAL_COMMUNITY): Payer: Medicare Other

## 2022-06-02 ENCOUNTER — Encounter (HOSPITAL_COMMUNITY): Payer: Self-pay

## 2022-06-02 ENCOUNTER — Emergency Department (HOSPITAL_COMMUNITY)
Admission: EM | Admit: 2022-06-02 | Discharge: 2022-06-02 | Discharge status: Against Medical Advice | Payer: Medicare Other | Attending: Emergency Medicine | Admitting: Emergency Medicine

## 2022-06-02 DIAGNOSIS — Z5321 Procedure and treatment not carried out due to patient leaving prior to being seen by health care provider: Secondary | ICD-10-CM | POA: Insufficient documentation

## 2022-06-02 DIAGNOSIS — R112 Nausea with vomiting, unspecified: Secondary | ICD-10-CM | POA: Diagnosis not present

## 2022-06-02 DIAGNOSIS — R109 Unspecified abdominal pain: Secondary | ICD-10-CM | POA: Insufficient documentation

## 2022-06-02 DIAGNOSIS — R1010 Upper abdominal pain, unspecified: Secondary | ICD-10-CM | POA: Insufficient documentation

## 2022-06-02 LAB — CBC WITH DIFFERENTIAL/PLATELET
Abs Immature Granulocytes: 0.05 10*3/uL (ref 0.00–0.07)
Basophils Absolute: 0 10*3/uL (ref 0.0–0.1)
Basophils Relative: 0 %
Eosinophils Absolute: 0 10*3/uL (ref 0.0–0.5)
Eosinophils Relative: 0 %
HCT: 46.5 % — ABNORMAL HIGH (ref 36.0–46.0)
Hemoglobin: 15 g/dL (ref 12.0–15.0)
Immature Granulocytes: 0 %
Lymphocytes Relative: 21 %
Lymphs Abs: 2.4 10*3/uL (ref 0.7–4.0)
MCH: 27.1 pg (ref 26.0–34.0)
MCHC: 32.3 g/dL (ref 30.0–36.0)
MCV: 84.1 fL (ref 80.0–100.0)
Monocytes Absolute: 0.5 10*3/uL (ref 0.1–1.0)
Monocytes Relative: 5 %
Neutro Abs: 8.2 10*3/uL — ABNORMAL HIGH (ref 1.7–7.7)
Neutrophils Relative %: 74 %
Platelets: 351 10*3/uL (ref 150–400)
RBC: 5.53 MIL/uL — ABNORMAL HIGH (ref 3.87–5.11)
RDW: 14.1 % (ref 11.5–15.5)
WBC: 11.2 10*3/uL — ABNORMAL HIGH (ref 4.0–10.5)
nRBC: 0 % (ref 0.0–0.2)

## 2022-06-02 LAB — COMPREHENSIVE METABOLIC PANEL
ALT: 18 U/L (ref 0–44)
AST: 18 U/L (ref 15–41)
Albumin: 4.5 g/dL (ref 3.5–5.0)
Alkaline Phosphatase: 148 U/L — ABNORMAL HIGH (ref 38–126)
Anion gap: 11 (ref 5–15)
BUN: 9 mg/dL (ref 6–20)
CO2: 25 mmol/L (ref 22–32)
Calcium: 9.3 mg/dL (ref 8.9–10.3)
Chloride: 101 mmol/L (ref 98–111)
Creatinine, Ser: 0.56 mg/dL (ref 0.44–1.00)
GFR, Estimated: >60 mL/min (ref >60)
Glucose, Bld: 292 mg/dL — ABNORMAL HIGH (ref 70–99)
Potassium: 4.1 mmol/L (ref 3.5–5.1)
Sodium: 137 mmol/L (ref 135–145)
Total Bilirubin: 0.8 mg/dL (ref 0.3–1.2)
Total Protein: 8.1 g/dL (ref 6.5–8.1)

## 2022-06-02 LAB — LIPASE, BLOOD: Lipase: 45 U/L (ref 11–51)

## 2022-06-02 MED ORDER — IOHEXOL 300 MG/ML  SOLN
100.0000 mL | Freq: Once | INTRAMUSCULAR | Status: AC | PRN
  Administered 2022-06-02: 100 mL via INTRAVENOUS

## 2022-06-02 NOTE — ED Triage Notes (Signed)
Pt presents with c/o abdominal pain that started approx one week ago. Pt reports some N/V with her pain but no diarrhea.

## 2022-06-02 NOTE — ED Provider Triage Note (Signed)
Emergency Medicine Provider Triage Evaluation Note  Delphina Schum , a 56 y.o. female  was evaluated in triage.  Pt complains of epigastric pain x 1 week. No radiation. Endorses N/V. Last BM this AM. H/O  cholecystectomy, hysterectomy.   Review of Systems  Per HPI  Physical Exam  BP (!) 175/95 (BP Location: Right Arm)   Pulse 80   Temp 97.8 F (36.6 C) (Oral)   Resp 20   Ht '5\' 4"'$  (1.626 m)   Wt 96.6 kg   SpO2 97%   BMI 36.56 kg/m  Gen:   Awake, uncomfortable Resp:  Normal effort  MSK:   Moves extremities without difficulty  Other:  Diffuse TTP.  Medical Decision Making  Medically screening exam initiated at 7:40 AM.  Appropriate orders placed.  Carlise Stofer Sunga was informed that the remainder of the evaluation will be completed by another provider, this initial triage assessment does not replace that evaluation, and the importance of remaining in the ED until their evaluation is complete.     Sherrill Raring, [place] 06/02/22 [phone]

## 2022-06-07 ENCOUNTER — Ambulatory Visit
Admission: EM | Admit: 2022-06-07 | Discharge: 2022-06-07 | Disposition: A | Discharge status: Home / Self Care | Payer: Medicare Other | Attending: Physician Assistant | Admitting: Physician Assistant

## 2022-06-07 ENCOUNTER — Ambulatory Visit (INDEPENDENT_AMBULATORY_CARE_PROVIDER_SITE_OTHER): Payer: Medicare Other

## 2022-06-07 DIAGNOSIS — W19XXXA Unspecified fall, initial encounter: Secondary | ICD-10-CM | POA: Diagnosis not present

## 2022-06-07 DIAGNOSIS — M25522 Pain in left elbow: Secondary | ICD-10-CM | POA: Diagnosis not present

## 2022-06-07 DIAGNOSIS — S62102A Fracture of unspecified carpal bone, left wrist, initial encounter for closed fracture: Secondary | ICD-10-CM | POA: Diagnosis not present

## 2022-06-07 DIAGNOSIS — M25532 Pain in left wrist: Secondary | ICD-10-CM

## 2022-06-07 MED ORDER — ALBUTEROL SULFATE HFA 108 (90 BASE) MCG/ACT IN AERS: 2.0000 | INHALATION_SPRAY | RESPIRATORY_TRACT | 0 refills | Status: AC | PRN

## 2022-06-07 NOTE — ED Provider Notes (Signed)
[facility]    CSN: [id number] Arrival date & time: 06/07/22  1007      History   Chief Complaint No chief complaint on file.   HPI [name] is a 56 y.o. female.   Patient here today for evaluation of left-sided wrist and elbow pain that started after she fell earlier this morning.  She reports that she fell backwards and is not sure how she fell but states she must of landed on her left hand.  Her pain is worse to her left proximal hand, left wrist and radiates into her left forearm and elbow.  She is able to fully extend and flex her elbow without significant pain.  Movement of her L fingers and hand/ wrist causes significant pain. She has not had any numbness. She does not report any head injury or LOC. She has not taken any medication for symptoms. She denies any prior injury to left wrist or arm.   The history is provided by the patient.    Past Medical History:  Diagnosis Date   Anxiety    Arthritis    rheumatoid , osteoarthritis   Bronchitis    Chronic back pain    Depression    Diabetes mellitus    Fatty liver    Fibromyalgia    GERD (gastroesophageal reflux disease)    Headache    Hypertension    Pancreatitis    Pneumonia    Rotator cuff tear, right     Patient Active Problem List   Diagnosis Date Noted   Adrenal adenoma, left 09/13/2021   Intractable nausea and vomiting 09/12/2021   Anxiety 09/12/2021   Gastroenteritis 09/09/2021   Diabetes mellitus type 2 in obese ([place]) 09/09/2021   Class 2 obesity due to excess calories with body mass index (BMI) of 36.0 to 36.9 in adult 09/09/2021   Marijuana abuse 09/09/2021   Tobacco dependence 09/09/2021   Essential hypertension 05/29/2020   Abdominal pain 11/23/2019   Acute pancreatitis 11/21/2019   De Quervain's syndrome (tenosynovitis) 07/06/2018   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   Complete tear of right rotator cuff 07/22/2017    Class: Chronic   Retained orthopedic hardware 07/22/2017     Class: Chronic   Cervical disc herniation 07/21/2017    Class: Acute   Status post cervical spinal fusion 07/21/17 07/21/2017   Chronic back pain     Past Surgical History:  Procedure Laterality Date   ABDOMINAL HYSTERECTOMY     ANTERIOR CERVICAL DECOMP/DISCECTOMY FUSION N/A 07/21/2017   Procedure: ANTERIOR CERVICAL DISCECTOMY AND FUSION C6-7, REMOVAL OF SCREW C6 PLATE, DEPUY ZERO-P IMPLANT, LOCAL BONE GRAFT, ALLOGRAFT BONE GRAFT, VIVIGEN;  Surgeon: Jessy Oto, MD;  Location: [facility];  Service: Orthopedics;  Laterality: N/A;   BACK SURGERY     laminectomy   BONE GRAFT HIP ILIAC CREST     BREAST BIOPSY     CARPAL TUNNEL RELEASE     CERVICAL DISCECTOMY  07/21/2017   CESAREAN SECTION     CHOLECYSTECTOMY     COLONOSCOPY     frontal fusion     neck fusion     patient reported   SHOULDER ARTHROSCOPY WITH SUBACROMIAL DECOMPRESSION, ROTATOR CUFF REPAIR AND BICEP TENDON REPAIR Right 10/31/2017   Procedure: RIGHT SHOULDER ARTHROSCOPY, MANIPULATION UNDER ANESTHESIA, BICEPS TENODESIS, AND MINI OPEN ROTATOR CUFF TEAR REPAIR;  Surgeon: Meredith Pel, MD;  Location: [facility];  Service: Orthopedics;  Laterality: Right;    OB History   No obstetric history  on file.      Home Medications    Prior to Admission medications   Medication Sig Start Date End Date Taking? Authorizing Provider  acetaminophen (TYLENOL) 500 MG tablet Take 500 mg by mouth 2 (two) times daily as needed for moderate pain.    [provider]  albuterol (PROVENTIL) (2.5 MG/3ML) 0.083% nebulizer solution Take 3 mLs (2.5 mg total) by nebulization every 6 (six) hours as needed for wheezing or shortness of breath. 12/12/19   Tasia Catchings, Amy V, PA-C  albuterol (VENTOLIN HFA) 108 (90 Base) MCG/ACT inhaler Inhale 2 puffs into the lungs every 4 (four) hours as needed for wheezing. 06/07/22   Francene Finders, PA-C  ALPRAZolam Duanne Moron) 1 MG tablet Take 1 mg by mouth daily as needed for anxiety. 10/23/19   [provider]  amLODipine-olmesartan (AZOR) 5-40 MG tablet Take 1 tablet by mouth daily. 06/05/21   [provider]  aspirin EC 81 MG tablet Take 81 mg by mouth daily. Swallow whole.    [provider]  cetirizine (ZYRTEC) 10 MG tablet Take 10 mg by mouth daily as needed for allergies. 09/27/20   [provider]  CREON 36000-114000 units CPEP capsule Take 36,000 Units by mouth daily. Patient not taking: Reported on 12/27/2021 10/16/21   [provider]  cyclobenzaprine (FLEXERIL) 10 MG tablet TAKE 1 TABLET(10 MG) BY MOUTH TWICE DAILY AS NEEDED FOR MUSCLE SPASMS 02/11/22   Jessy Oto, MD  dicyclomine (BENTYL) 10 MG capsule Take 10 mg by mouth daily. 10/16/21   [provider]  escitalopram (LEXAPRO) 20 MG tablet Take 20 mg by mouth daily. 08/10/20   [provider]  fluticasone (FLONASE) 50 MCG/ACT nasal spray Place 1 spray into both nostrils daily.    [provider]  HYDROcodone-acetaminophen (NORCO/VICODIN) 5-325 MG tablet Take 1 tablet by mouth every 6 (six) hours as needed for moderate pain. 09/14/21   Mariel Aloe, MD  ibuprofen (ADVIL) 800 MG tablet Take 1 tablet (800 mg total) by mouth every 8 (eight) hours as needed (pain). 12/27/21   Barrett Henle, MD  insulin glargine (LANTUS) 100 unit/mL SOPN Inject 60 Units into the skin at bedtime.    [provider]  loperamide (IMODIUM) 2 MG capsule Take 1 capsule (2 mg total) by mouth 4 (four) times daily as needed for diarrhea or loose stools. 09/09/21   Hayden Rasmussen, MD  metFORMIN (GLUCOPHAGE) 1000 MG tablet Take 1,000 mg by mouth 2 (two) times daily. Patient not taking: Reported on 12/27/2021 03/26/21   [provider]  metoprolol succinate (TOPROL-XL) 50 MG 24 hr tablet Take 50 mg by mouth daily. 07/21/14   [provider]  ondansetron (ZOFRAN) 4 MG tablet Take 1 tablet (4 mg total) by mouth every 6 (six) hours. Patient taking differently: Take 4 mg by mouth every  8 (eight) hours as needed for vomiting or nausea. 10/15/21   Valarie Merino, MD  phentermine (ADIPEX-P) 37.5 MG tablet Take 18.75 mg by mouth daily as needed (weight gain). 09/28/21   [provider]  PRESCRIPTION MEDICATION Inject 60 Units into the skin daily. As per patient doctor changed her insulin on 10/16/21. Patient got samples from doctors office; not sure about the name.    [provider]  simvastatin (ZOCOR) 20 MG tablet Take 20 mg by mouth daily. 08/22/20   [provider]  sitaGLIPtin (JANUVIA) 100 MG tablet Take 100 mg by mouth daily.    [provider]  traMADol (ULTRAM) 50 MG tablet Take 50 mg by mouth every 6 (six) hours as needed for moderate pain. Patient not taking: Reported on 06/07/2022    [provider]  promethazine (PHENERGAN) 25 MG tablet Take 1 tablet (25 mg total) by mouth every 6 (six) hours as needed for nausea or vomiting. Patient not taking: No sig reported 08/15/20 10/19/20  Wieters, Elesa Hacker, PA-C    Family History Family History  Problem Relation Age of Onset   Hypertension Father    Renal Disease Father    Diabetes Mother    Hypertension Mother    Edema Mother    Diabetes Sister    Diabetes Brother     Social History Social History   Tobacco Use   Smoking status: Every Day    Packs/day: 0.50    Years: 40.00    Total pack years: 20.00    Types: Cigarettes   Smokeless tobacco: Never  Vaping Use   Vaping Use: Never used  Substance Use Topics   Alcohol use: No   Drug use: Yes    Types: Marijuana    Comment: 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   Review of Systems Review of Systems  Constitutional:  Negative for chills and fever.  Eyes:  Negative for discharge and redness.  Gastrointestinal:  Negative for nausea and vomiting.  Musculoskeletal:  Positive for arthralgias and joint swelling.   Neurological:  Negative for numbness.     Physical Exam Triage Vital Signs ED Triage Vitals  Enc Vitals Group     BP 06/07/22 1121 (!) 175/82     Pulse Rate 06/07/22 1121 82     Resp 06/07/22 1121 18     Temp 06/07/22 1121 98.2 F (36.8 C)     Temp Source 06/07/22 1121 Oral     SpO2 06/07/22 1121 (S) 92 %     Weight --      Height --      Head Circumference --      Peak Flow --      Pain Score 06/07/22 1119 10     Pain Loc --      Pain Edu? --      Excl. in [place]? --    No data found.  Updated Vital Signs BP (!) 175/82   Pulse 82   Temp 98.2 F (36.8 C) (Oral)   Resp 18   SpO2 (S) 92% Comment: pt reports mild sob, albuterol inhaler recall.     Physical Exam Vitals and nursing note reviewed.  Constitutional:      General: She is not in acute distress.    Appearance: Normal appearance. She is not ill-appearing.  HENT:     Head: Normocephalic and atraumatic.  Eyes:     Conjunctiva/sclera: Conjunctivae normal.  Cardiovascular:     Rate and Rhythm: Normal rate.  Pulmonary:     Effort: Pulmonary effort is normal. No respiratory distress.  Musculoskeletal:     Comments: Full ROM of left elbow, No significant TTP to left forearm, TTP noted diffusely to left wrist, worse to dorsal surface, metacarpal area,   Skin:    Capillary Refill: Normal cap refill to left fingers Neurological:     Mental Status: She is alert.     Comments: Gross sensation intact to distal left fingers.   Psychiatric:  Mood and Affect: Mood normal.        Behavior: Behavior normal.        Thought Content: Thought content normal.      UC Treatments / Results  Labs (all labs ordered are listed, but only abnormal results are displayed) Labs Reviewed - No data to display  EKG   Radiology DG Wrist Complete Left  Result Date: 06/07/2022 CLINICAL DATA:  Fall. EXAM: LEFT WRIST - COMPLETE 3+ VIEW COMPARISON:  None Available. FINDINGS: Small ossific density overlying the proximal  carpal row on the lateral view, which may correspond to a well corticated ossific density between the triquetrum and ulnar styloid process on the frontal view. No dislocation. Bone mineralization is normal. Soft tissues are unremarkable. IMPRESSION: 1. Small ossific density overlying the proximal carpal row on the lateral view, which may correspond to a well corticated ossific density between the triquetrum and ulnar styloid process on the frontal view, favoring old injury. Correlate with point tenderness. Electronically Signed   By: Titus Dubin M.D.   On: 06/07/2022 12:20   DG Elbow Complete Left  Result Date: 06/07/2022 CLINICAL DATA:  Fall. EXAM: LEFT ELBOW - COMPLETE 3+ VIEW COMPARISON:  None Available. FINDINGS: There is no evidence of fracture, dislocation, or joint effusion. There is no evidence of arthropathy or other focal bone abnormality. Soft tissues are unremarkable. IMPRESSION: Negative. Electronically Signed   By: Titus Dubin M.D.   On: 06/07/2022 12:16    Procedures Procedures (including critical care time)  Medications Ordered in UC Medications - No data to display  Initial Impression / Assessment and Plan / UC Course  I have reviewed the triage vital signs and the nursing notes.  Pertinent labs & imaging results that were available during my care of the patient were reviewed by me and considered in my medical decision making (see chart for details).    Possible left wrist fracture- given point TTP will treat with brace in office and encouraged follow up with ortho early next week. Patient expresses understanding, does not wish to see ortho today. Recommend sooner follow up with any further concerns.   Final Clinical Impressions(s) / UC Diagnoses   Final diagnoses:  Closed fracture of left wrist, initial encounter  Fall, initial encounter   Discharge Instructions   None    ED Prescriptions     Medication Sig Dispense Auth. Provider   albuterol (VENTOLIN HFA)  108 (90 Base) MCG/ACT inhaler Inhale 2 puffs into the lungs every 4 (four) hours as needed for wheezing. 8 g Francene Finders, PA-C      PDMP not reviewed this encounter.   Francene Finders, PA-C 06/07/22 1514
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
## 2022-06-17 ENCOUNTER — Other Ambulatory Visit: Payer: Self-pay | Admitting: Specialist

## 2022-08-14 ENCOUNTER — Encounter (HOSPITAL_COMMUNITY): Payer: Self-pay

## 2022-08-14 ENCOUNTER — Inpatient Hospital Stay (HOSPITAL_COMMUNITY)
Admission: EM | Admit: 2022-08-14 | Discharge: 2022-08-17 | DRG: 440 | Disposition: A | Discharge status: Home / Self Care | Payer: Medicare Other | Attending: Internal Medicine | Admitting: Internal Medicine

## 2022-08-14 ENCOUNTER — Emergency Department (HOSPITAL_COMMUNITY): Payer: Medicare Other

## 2022-08-14 ENCOUNTER — Other Ambulatory Visit: Payer: Self-pay

## 2022-08-14 DIAGNOSIS — R000 Tachycardia, unspecified: Secondary | ICD-10-CM | POA: Diagnosis present

## 2022-08-14 DIAGNOSIS — E669 Obesity, unspecified: Secondary | ICD-10-CM | POA: Diagnosis present

## 2022-08-14 DIAGNOSIS — Z7982 Long term (current) use of aspirin: Secondary | ICD-10-CM

## 2022-08-14 DIAGNOSIS — Z981 Arthrodesis status: Secondary | ICD-10-CM

## 2022-08-14 DIAGNOSIS — Z90710 Acquired absence of both cervix and uterus: Secondary | ICD-10-CM

## 2022-08-14 DIAGNOSIS — K859 Acute pancreatitis without necrosis or infection, unspecified: Secondary | ICD-10-CM | POA: Diagnosis not present

## 2022-08-14 DIAGNOSIS — F32A Depression, unspecified: Secondary | ICD-10-CM | POA: Diagnosis present

## 2022-08-14 DIAGNOSIS — I10 Essential (primary) hypertension: Secondary | ICD-10-CM | POA: Diagnosis present

## 2022-08-14 DIAGNOSIS — R112 Nausea with vomiting, unspecified: Secondary | ICD-10-CM

## 2022-08-14 DIAGNOSIS — E1169 Type 2 diabetes mellitus with other specified complication: Secondary | ICD-10-CM | POA: Diagnosis present

## 2022-08-14 DIAGNOSIS — E785 Hyperlipidemia, unspecified: Secondary | ICD-10-CM | POA: Diagnosis present

## 2022-08-14 DIAGNOSIS — E1165 Type 2 diabetes mellitus with hyperglycemia: Secondary | ICD-10-CM | POA: Diagnosis present

## 2022-08-14 DIAGNOSIS — F419 Anxiety disorder, unspecified: Secondary | ICD-10-CM | POA: Diagnosis present

## 2022-08-14 DIAGNOSIS — Z79899 Other long term (current) drug therapy: Secondary | ICD-10-CM

## 2022-08-14 DIAGNOSIS — Z9049 Acquired absence of other specified parts of digestive tract: Secondary | ICD-10-CM

## 2022-08-14 DIAGNOSIS — R1013 Epigastric pain: Secondary | ICD-10-CM

## 2022-08-14 DIAGNOSIS — R197 Diarrhea, unspecified: Secondary | ICD-10-CM | POA: Diagnosis present

## 2022-08-14 DIAGNOSIS — Z794 Long term (current) use of insulin: Secondary | ICD-10-CM

## 2022-08-14 DIAGNOSIS — Z841 Family history of disorders of kidney and ureter: Secondary | ICD-10-CM

## 2022-08-14 DIAGNOSIS — K760 Fatty (change of) liver, not elsewhere classified: Secondary | ICD-10-CM | POA: Diagnosis present

## 2022-08-14 DIAGNOSIS — F1210 Cannabis abuse, uncomplicated: Secondary | ICD-10-CM | POA: Diagnosis present

## 2022-08-14 DIAGNOSIS — K219 Gastro-esophageal reflux disease without esophagitis: Secondary | ICD-10-CM | POA: Diagnosis present

## 2022-08-14 DIAGNOSIS — R109 Unspecified abdominal pain: Secondary | ICD-10-CM | POA: Diagnosis present

## 2022-08-14 DIAGNOSIS — Z833 Family history of diabetes mellitus: Secondary | ICD-10-CM

## 2022-08-14 DIAGNOSIS — Z8249 Family history of ischemic heart disease and other diseases of the circulatory system: Secondary | ICD-10-CM

## 2022-08-14 DIAGNOSIS — F17210 Nicotine dependence, cigarettes, uncomplicated: Secondary | ICD-10-CM | POA: Diagnosis present

## 2022-08-14 DIAGNOSIS — G8929 Other chronic pain: Secondary | ICD-10-CM | POA: Diagnosis present

## 2022-08-14 DIAGNOSIS — F17200 Nicotine dependence, unspecified, uncomplicated: Secondary | ICD-10-CM | POA: Diagnosis present

## 2022-08-14 DIAGNOSIS — F1290 Cannabis use, unspecified, uncomplicated: Secondary | ICD-10-CM | POA: Diagnosis present

## 2022-08-14 DIAGNOSIS — K861 Other chronic pancreatitis: Secondary | ICD-10-CM | POA: Diagnosis present

## 2022-08-14 DIAGNOSIS — G894 Chronic pain syndrome: Secondary | ICD-10-CM | POA: Diagnosis present

## 2022-08-14 DIAGNOSIS — M797 Fibromyalgia: Secondary | ICD-10-CM | POA: Diagnosis present

## 2022-08-14 LAB — URINALYSIS, ROUTINE W REFLEX MICROSCOPIC
Bacteria, UA: NONE SEEN
Bilirubin Urine: NEGATIVE
Glucose, UA: >=500 mg/dL — AB
Ketones, ur: 20 mg/dL — AB
Leukocytes,Ua: NEGATIVE
Nitrite: NEGATIVE
Protein, ur: NEGATIVE mg/dL
Specific Gravity, Urine: 1.026 (ref 1.005–1.030)
pH: 5 (ref 5.0–8.0)

## 2022-08-14 LAB — COMPREHENSIVE METABOLIC PANEL
ALT: 17 U/L (ref 0–44)
AST: 18 U/L (ref 15–41)
Albumin: 4.2 g/dL (ref 3.5–5.0)
Alkaline Phosphatase: 131 U/L — ABNORMAL HIGH (ref 38–126)
Anion gap: 12 (ref 5–15)
BUN: 10 mg/dL (ref 6–20)
CO2: 23 mmol/L (ref 22–32)
Calcium: 8.7 mg/dL — ABNORMAL LOW (ref 8.9–10.3)
Chloride: 96 mmol/L — ABNORMAL LOW (ref 98–111)
Creatinine, Ser: 0.6 mg/dL (ref 0.44–1.00)
GFR, Estimated: >60 mL/min (ref >60)
Glucose, Bld: 321 mg/dL — ABNORMAL HIGH (ref 70–99)
Potassium: 3.8 mmol/L (ref 3.5–5.1)
Sodium: 131 mmol/L — ABNORMAL LOW (ref 135–145)
Total Bilirubin: 0.8 mg/dL (ref 0.3–1.2)
Total Protein: 7.9 g/dL (ref 6.5–8.1)

## 2022-08-14 LAB — CBC WITH DIFFERENTIAL/PLATELET
Abs Immature Granulocytes: 0.06 10*3/uL (ref 0.00–0.07)
Basophils Absolute: 0 10*3/uL (ref 0.0–0.1)
Basophils Relative: 0 %
Eosinophils Absolute: 0 10*3/uL (ref 0.0–0.5)
Eosinophils Relative: 0 %
HCT: 42 % (ref 36.0–46.0)
Hemoglobin: 13.9 g/dL (ref 12.0–15.0)
Immature Granulocytes: 1 %
Lymphocytes Relative: 15 %
Lymphs Abs: 1.7 10*3/uL (ref 0.7–4.0)
MCH: 27.5 pg (ref 26.0–34.0)
MCHC: 33.1 g/dL (ref 30.0–36.0)
MCV: 83 fL (ref 80.0–100.0)
Monocytes Absolute: 0.3 10*3/uL (ref 0.1–1.0)
Monocytes Relative: 3 %
Neutro Abs: 9.2 10*3/uL — ABNORMAL HIGH (ref 1.7–7.7)
Neutrophils Relative %: 81 %
Platelets: 336 10*3/uL (ref 150–400)
RBC: 5.06 MIL/uL (ref 3.87–5.11)
RDW: 14.5 % (ref 11.5–15.5)
WBC: 11.2 10*3/uL — ABNORMAL HIGH (ref 4.0–10.5)
nRBC: 0 % (ref 0.0–0.2)

## 2022-08-14 LAB — RAPID URINE DRUG SCREEN, HOSP PERFORMED
Amphetamines: NOT DETECTED
Barbiturates: NOT DETECTED
Benzodiazepines: NOT DETECTED
Cocaine: NOT DETECTED
Opiates: NOT DETECTED
Tetrahydrocannabinol: POSITIVE — AB

## 2022-08-14 LAB — TROPONIN I (HIGH SENSITIVITY)
Troponin I (High Sensitivity): 3 ng/L (ref <18)
Troponin I (High Sensitivity): 3 ng/L (ref <18)

## 2022-08-14 LAB — CBG MONITORING, ED
Glucose-Capillary: 305 mg/dL — ABNORMAL HIGH (ref 70–99)
Glucose-Capillary: 203 mg/dL — ABNORMAL HIGH (ref 70–99)

## 2022-08-14 LAB — LIPASE, BLOOD: Lipase: 40 U/L (ref 11–51)

## 2022-08-14 MED ORDER — ONDANSETRON HCL 4 MG/2ML IJ SOLN
4.0000 mg | Freq: Once | INTRAMUSCULAR | Status: AC
  Administered 2022-08-14: 4 mg via INTRAVENOUS
  Filled 2022-08-14: qty 2

## 2022-08-14 MED ORDER — PROCHLORPERAZINE EDISYLATE 10 MG/2ML IJ SOLN
10.0000 mg | Freq: Four times a day (QID) | INTRAMUSCULAR | Status: DC | PRN
  Administered 2022-08-14 – 2022-08-17 (×4): 10 mg via INTRAVENOUS
  Filled 2022-08-14 (×4): qty 2

## 2022-08-14 MED ORDER — ACETAMINOPHEN 650 MG RE SUPP: 650.0000 mg | Freq: Four times a day (QID) | RECTAL | Status: DC | PRN

## 2022-08-14 MED ORDER — SODIUM CHLORIDE (PF) 0.9 % IJ SOLN
INTRAMUSCULAR | Status: AC
  Filled 2022-08-14: qty 50

## 2022-08-14 MED ORDER — PANTOPRAZOLE SODIUM 40 MG IV SOLR
40.0000 mg | INTRAVENOUS | Status: DC
  Administered 2022-08-14 – 2022-08-16 (×3): 40 mg via INTRAVENOUS
  Filled 2022-08-14 (×3): qty 10

## 2022-08-14 MED ORDER — ONDANSETRON HCL 4 MG/2ML IJ SOLN: 4.0000 mg | Freq: Once | INTRAMUSCULAR | Status: DC

## 2022-08-14 MED ORDER — ACETAMINOPHEN 325 MG PO TABS
650.0000 mg | ORAL_TABLET | Freq: Four times a day (QID) | ORAL | Status: DC | PRN
  Administered 2022-08-15 – 2022-08-16 (×2): 650 mg via ORAL
  Filled 2022-08-14 (×2): qty 2

## 2022-08-14 MED ORDER — LACTATED RINGERS IV BOLUS
1000.0000 mL | Freq: Once | INTRAVENOUS | Status: AC
  Administered 2022-08-14: 1000 mL via INTRAVENOUS

## 2022-08-14 MED ORDER — METOPROLOL TARTRATE 5 MG/5ML IV SOLN: 5.0000 mg | Freq: Four times a day (QID) | INTRAVENOUS | Status: DC | PRN

## 2022-08-14 MED ORDER — HYDROMORPHONE HCL 1 MG/ML IJ SOLN: 1.0000 mg | Freq: Once | INTRAMUSCULAR | Status: DC

## 2022-08-14 MED ORDER — CAPSAICIN 0.025 % EX CREA
TOPICAL_CREAM | Freq: Once | CUTANEOUS | Status: DC
  Filled 2022-08-14: qty 60

## 2022-08-14 MED ORDER — LACTATED RINGERS IV BOLUS: 1000.0000 mL | Freq: Once | INTRAVENOUS | Status: DC

## 2022-08-14 MED ORDER — ENOXAPARIN SODIUM 40 MG/0.4ML IJ SOSY
40.0000 mg | PREFILLED_SYRINGE | INTRAMUSCULAR | Status: DC
  Administered 2022-08-14 – 2022-08-16 (×3): 40 mg via SUBCUTANEOUS
  Filled 2022-08-14 (×3): qty 0.4

## 2022-08-14 MED ORDER — INSULIN ASPART 100 UNIT/ML IJ SOLN
0.0000 [IU] | Freq: Every day | INTRAMUSCULAR | Status: DC
  Administered 2022-08-14: 2 [IU] via SUBCUTANEOUS
  Administered 2022-08-15: 1 [IU] via SUBCUTANEOUS
  Administered 2022-08-16: 4 [IU] via SUBCUTANEOUS
  Filled 2022-08-14: qty 0.05

## 2022-08-14 MED ORDER — OXYCODONE HCL 5 MG PO TABS
5.0000 mg | ORAL_TABLET | ORAL | Status: DC | PRN
  Administered 2022-08-14 – 2022-08-17 (×7): 5 mg via ORAL
  Filled 2022-08-14 (×7): qty 1

## 2022-08-14 MED ORDER — IOHEXOL 300 MG/ML  SOLN
100.0000 mL | Freq: Once | INTRAMUSCULAR | Status: AC | PRN
  Administered 2022-08-15: 100 mL via INTRAVENOUS

## 2022-08-14 MED ORDER — DOCUSATE SODIUM 100 MG PO CAPS
100.0000 mg | ORAL_CAPSULE | Freq: Two times a day (BID) | ORAL | Status: DC
  Administered 2022-08-14 – 2022-08-16 (×3): 100 mg via ORAL
  Filled 2022-08-14 (×5): qty 1

## 2022-08-14 MED ORDER — MORPHINE SULFATE (PF) 4 MG/ML IV SOLN
4.0000 mg | INTRAVENOUS | Status: DC | PRN
  Administered 2022-08-14 – 2022-08-16 (×5): 4 mg via INTRAVENOUS
  Filled 2022-08-14 (×5): qty 1

## 2022-08-14 MED ORDER — INSULIN ASPART 100 UNIT/ML IJ SOLN
0.0000 [IU] | Freq: Three times a day (TID) | INTRAMUSCULAR | Status: DC
  Administered 2022-08-14: 11 [IU] via SUBCUTANEOUS
  Administered 2022-08-15: 5 [IU] via SUBCUTANEOUS
  Administered 2022-08-15: 11 [IU] via SUBCUTANEOUS
  Administered 2022-08-15: 3 [IU] via SUBCUTANEOUS
  Administered 2022-08-16: 8 [IU] via SUBCUTANEOUS
  Administered 2022-08-16 – 2022-08-17 (×3): 5 [IU] via SUBCUTANEOUS
  Filled 2022-08-14: qty 0.15

## 2022-08-14 MED ORDER — DROPERIDOL 2.5 MG/ML IJ SOLN: 2.5000 mg | Freq: Once | INTRAMUSCULAR | Status: DC

## 2022-08-14 MED ORDER — LACTATED RINGERS IV SOLN: INTRAVENOUS | Status: DC

## 2022-08-14 MED ORDER — PHENOL 1.4 % MT LIQD
1.0000 | OROMUCOSAL | Status: DC | PRN
  Filled 2022-08-14: qty 177

## 2022-08-14 MED ORDER — HYDROMORPHONE HCL 1 MG/ML IJ SOLN
1.0000 mg | Freq: Once | INTRAMUSCULAR | Status: AC
  Administered 2022-08-14: 1 mg via INTRAVENOUS
  Filled 2022-08-14: qty 1

## 2022-08-14 NOTE — ED Triage Notes (Signed)
Pt via EMS from home c/o 10/10 epigastric abdominal pain since yesterday. Hx DM2 and pancreatitis and pt states this feels like a flare. EMS admin 257mg fentanyl, '4mg'$  [facility]fran[facility] in [facility].   BP 142/88 CBG 329 HR 88 (down from 140 on EMS arrival) EKG unremarkable

## 2022-08-14 NOTE — ED Provider Notes (Signed)
[facility] DEPT Provider Note   CSN: [id number] Arrival date & time: 08/14/22  6270     History  Chief Complaint  Patient presents with   Abdominal Pain    [name] is a 57 y.o. female.  Patient with a history of pancreatitis presenting with upper abdominal pain with nausea, vomiting and diarrhea for the past 2 days.  States unable to keep any down at home.  Multiple times of vomiting at home yesterday without any blood.  Several episodes of diarrhea as well.  No known fever but has had chills and sweats.  Feels similar to previous episodes of pancreatitis.  No pain with urination or blood in the urine.  No chest pain or shortness of breath.  Does have a history of diabetes.  Previous cholecystectomy.  Reports history of EGD in the past without significant findings. Denies any alcohol or NSAID use.  Remains tachycardic and uncomfortable.  Blood sugars have been well-controlled.  Denies any cough, runny nose, sore throat, chest pain or shortness of breath.  The history is provided by the patient.  Abdominal Pain Associated symptoms: diarrhea, nausea and vomiting   Associated symptoms: no cough, no dysuria, no fever, no hematuria and no shortness of breath        Home Medications Prior to Admission medications   Medication Sig Start Date End Date Taking? Authorizing Provider  acetaminophen (TYLENOL) 500 MG tablet Take 500 mg by mouth 2 (two) times daily as needed for moderate pain.    [provider]  albuterol (PROVENTIL) (2.5 MG/3ML) 0.083% nebulizer solution Take 3 mLs (2.5 mg total) by nebulization every 6 (six) hours as needed for wheezing or shortness of breath. 12/12/19   Tasia Catchings, Amy V, PA-C  albuterol (VENTOLIN HFA) 108 (90 Base) MCG/ACT inhaler Inhale 2 puffs into the lungs every 4 (four) hours as needed for wheezing. 06/07/22   Francene Finders, PA-C  ALPRAZolam Duanne Moron) 1 MG tablet Take 1 mg by mouth daily as needed for anxiety.  10/23/19   [provider]  amLODipine-olmesartan (AZOR) 5-40 MG tablet Take 1 tablet by mouth daily. 06/05/21   [provider]  aspirin EC 81 MG tablet Take 81 mg by mouth daily. Swallow whole.    [provider]  cetirizine (ZYRTEC) 10 MG tablet Take 10 mg by mouth daily as needed for allergies. 09/27/20   [provider]  CREON 36000-114000 units CPEP capsule Take 36,000 Units by mouth daily. Patient not taking: Reported on 12/27/2021 10/16/21   [provider]  cyclobenzaprine (FLEXERIL) 10 MG tablet TAKE 1 TABLET(10 MG) BY MOUTH TWICE DAILY AS NEEDED FOR MUSCLE SPASMS 02/11/22   Jessy Oto, MD  dicyclomine (BENTYL) 10 MG capsule Take 10 mg by mouth daily. 10/16/21   [provider]  escitalopram (LEXAPRO) 20 MG tablet Take 20 mg by mouth daily. 08/10/20   [provider]  fluticasone (FLONASE) 50 MCG/ACT nasal spray Place 1 spray into both nostrils daily.    [provider]  HYDROcodone-acetaminophen (NORCO/VICODIN) 5-325 MG tablet Take 1 tablet by mouth every 6 (six) hours as needed for moderate pain. 09/14/21   Mariel Aloe, MD  ibuprofen (ADVIL) 800 MG tablet Take 1 tablet (800 mg total) by mouth every 8 (eight) hours as needed (pain). 12/27/21   Barrett Henle, MD  insulin glargine (LANTUS) 100 unit/mL SOPN Inject 60 Units into the skin at bedtime.    [provider]  loperamide (IMODIUM) 2  MG capsule Take 1 capsule (2 mg total) by mouth 4 (four) times daily as needed for diarrhea or loose stools. 09/09/21   Hayden Rasmussen, MD  metFORMIN (GLUCOPHAGE) 1000 MG tablet Take 1,000 mg by mouth 2 (two) times daily. Patient not taking: Reported on 12/27/2021 03/26/21   [provider]  metoprolol succinate (TOPROL-XL) 50 MG 24 hr tablet Take 50 mg by mouth daily. 07/21/14   [provider]  ondansetron (ZOFRAN) 4 MG tablet Take 1 tablet (4 mg total) by mouth every 6 (six) hours. Patient taking  differently: Take 4 mg by mouth every 8 (eight) hours as needed for vomiting or nausea. 10/15/21   Valarie Merino, MD  phentermine (ADIPEX-P) 37.5 MG tablet Take 18.75 mg by mouth daily as needed (weight gain). 09/28/21   [provider]  PRESCRIPTION MEDICATION Inject 60 Units into the skin daily. As per patient doctor changed her insulin on 10/16/21. Patient got samples from doctors office; not sure about the name.    [provider]  simvastatin (ZOCOR) 20 MG tablet Take 20 mg by mouth daily. 08/22/20   [provider]  sitaGLIPtin (JANUVIA) 100 MG tablet Take 100 mg by mouth daily.    [provider]  traMADol (ULTRAM) 50 MG tablet Take 50 mg by mouth every 6 (six) hours as needed for moderate pain. Patient not taking: Reported on 06/07/2022    [provider]  promethazine (PHENERGAN) 25 MG tablet Take 1 tablet (25 mg total) by mouth every 6 (six) hours as needed for nausea or vomiting. Patient not taking: No sig reported 08/15/20 10/19/20  Wieters, Madelynn Done C, PA-C      Allergies    Patient has no known allergies.    Review of Systems   Review of Systems  Constitutional:  Positive for activity change and appetite change. Negative for fever.  HENT:  Negative for congestion.   Respiratory:  Negative for cough, chest tightness and shortness of breath.   Gastrointestinal:  Positive for abdominal pain, diarrhea, nausea and vomiting.  Genitourinary:  Negative for dysuria and hematuria.  Musculoskeletal:  Negative for arthralgias and myalgias.  Skin:  Negative for rash.  Neurological:  Negative for weakness and headaches.   all other systems are negative except as noted in the HPI and PMH.    Physical Exam Updated Vital Signs BP (!) 144/98 (BP Location: Left Wrist)   Pulse (!) 117   Temp 97.9 F (36.6 C) (Oral)   Resp 17   Ht '5\' 4"'$  (1.626 m)   Wt 96.6 kg   SpO2 92%   BMI 36.56 kg/m  Physical Exam Vitals and nursing note reviewed.   Constitutional:      General: She is not in acute distress.    Appearance: She is well-developed.     Comments: Uncomfortable, tachycardic, tearful  HENT:     Head: Normocephalic and atraumatic.     Mouth/Throat:     Pharynx: No oropharyngeal exudate.  Eyes:     Conjunctiva/sclera: Conjunctivae normal.     Pupils: Pupils are equal, round, and reactive to light.  Neck:     Comments: No meningismus. Cardiovascular:     Rate and Rhythm: Regular rhythm. Tachycardia present.     Heart sounds: Normal heart sounds. No murmur heard. Pulmonary:     Effort: Pulmonary effort is normal. No respiratory distress.     Breath sounds: Normal breath sounds.  Abdominal:     Palpations: Abdomen is soft.  Tenderness: There is abdominal tenderness. There is guarding. There is no rebound.     Comments: Epigastric and right upper quadrant tenderness with voluntary guarding.  No lower abdominal tenderness  Musculoskeletal:        General: No tenderness. Normal range of motion.     Cervical back: Normal range of motion and neck supple.  Skin:    General: Skin is warm.  Neurological:     Mental Status: She is alert and oriented to person, place, and time.     Cranial Nerves: No cranial nerve deficit.     Motor: No abnormal muscle tone.     Coordination: Coordination normal.     Comments:  5/5 strength throughout. CN 2-12 intact.Equal grip strength.   Psychiatric:        Behavior: Behavior normal.     ED Results / Procedures / Treatments   Labs (all labs ordered are listed, but only abnormal results are displayed) Labs Reviewed  CBC WITH DIFFERENTIAL/PLATELET - Abnormal; Notable for the following components:      Result Value   WBC 11.2 (*)    Neutro Abs 9.2 (*)    All other components within normal limits  COMPREHENSIVE METABOLIC PANEL - Abnormal; Notable for the following components:   Sodium 131 (*)    Chloride 96 (*)    Glucose, Bld 321 (*)    Calcium 8.7 (*)    Alkaline Phosphatase  131 (*)    All other components within normal limits  URINALYSIS, ROUTINE W REFLEX MICROSCOPIC - Abnormal; Notable for the following components:   Glucose, UA >=500 (*)    Hgb urine dipstick SMALL (*)    Ketones, ur 20 (*)    All other components within normal limits  RAPID URINE DRUG SCREEN, HOSP PERFORMED - Abnormal; Notable for the following components:   Tetrahydrocannabinol POSITIVE (*)    All other components within normal limits  LIPASE, BLOOD  TROPONIN I (HIGH SENSITIVITY)  TROPONIN I (HIGH SENSITIVITY)    EKG None  Radiology US Abdomen Complete  Result Date: 08/14/2022 CLINICAL DATA:  History of pancreatitis. Abdominal pain vomiting. Prior cholecystectomy. EXAM: ABDOMEN ULTRASOUND COMPLETE COMPARISON:  CT 06/02/2022. Ultrasound 10/17/2021. Multiple older exams as well FINDINGS: Gallbladder: Gallbladder is surgically absent as per clinical history. Common bile duct: Diameter: 4 Liver: Mildly echogenic hepatic parenchyma consistent with fatty liver infiltration. No obvious mass lesion although evaluation for underlying mass lesion is limited with echogenic parenchyma. Please correlate with the prior contrast CT scan. No intrahepatic biliary ductal dilatation. Preserved flow in the main portal vein on color Doppler. IVC: No abnormality visualized. Pancreas: Visualized portion unremarkable. Some portions are obscured by overlapping bowel gas. Spleen: Size and appearance within normal limits. Right Kidney: Length: 9.0. Echogenicity within normal limits. No mass or hydronephrosis visualized. Left Kidney: Length: 10.0. Echogenicity within normal limits. No mass or hydronephrosis visualized. Abdominal aorta: No aneurysm visualized. Other findings: None. IMPRESSION: Previous cholecystectomy.  No ductal dilatation. Some portions of the abdomen are obscured by overlapping bowel gas. Electronically Signed   By: Jill Side M.D.   On: 08/14/2022 11:35    Procedures Procedures    Medications  Ordered in ED Medications  sodium chloride (PF) 0.9 % injection (has no administration in time range)  iohexol (OMNIPAQUE) 300 MG/ML solution 100 mL (has no administration in time range)  lactated ringers bolus 1,000 mL (has no administration in time range)  ondansetron (ZOFRAN) injection 4 mg (has no administration in time range)  HYDROmorphone (  DILAUDID) injection 1 mg (has no administration in time range)    ED Course/ Medical Decision Making/ A&P                           Medical Decision Making Amount and/or Complexity of Data Reviewed Labs: ordered. Decision-making details documented in ED Course. Radiology: ordered and independent interpretation performed. Decision-making details documented in ED Course. ECG/medicine tests: ordered and independent interpretation performed. Decision-making details documented in ED Course.  Risk Prescription drug management. Decision regarding hospitalization.  Upper abdominal pain with nausea and vomiting similar to previous episodes of pancreatitis.  She is tearful, anxious and tachycardic.  Abdomen tender in the epigastrium.  Patient given IV fluids, symptom control.  Labs in triage show normal lipase and LFTs.  No leukocytosis.  No fever. Hyperglycemia without evidence of DKA.  CT scan was ordered from triage but patient refuses this stating she was told in the past she has had too much radiation.  She reports 17 CT scans in the past 1 year without significant findings.  Patient given IV fluids, remains tachycardic.  UDS positive for THC.  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  Low suspicion for acute surgical pathology.  No evidence of acute pancreatitis.  Additional antiemetics are ordered.  Discussed with patient that she needs to stop using THC.  Low suspicion for acute pancreatitis or other acute surgical pathology.  Given her ongoing tachycardia cardia, persistent nausea and pain will plan  admission.  Discussed with Dr. Marylyn Ishihara.        Final Clinical Impression(s) / ED Diagnoses Final diagnoses:  Intractable nausea and vomiting    Rx / DC Orders ED Discharge Orders     None         Rubel Heckard, Annie Main, MD 08/14/22 1246

## 2022-08-14 NOTE — H&P (Signed)
History and Physical    [name] BDZ:[id number] DOB: [birth year] DOA: 08/14/2022 DOS: the patient was seen and examined on 08/14/2022 PCP: Nolene Ebbs, MD  Patient coming from: Home  Chief Complaint:  Chief Complaint  Patient presents with   Abdominal Pain   HPI: [name] is a 57 y.o. female with medical history significant of marijuana abuse, HTN, DM2, chronic pain, anxiety, GERD, chronic pancreatitis. Presenting with epigastric abdominal pain. Her symptoms started yesterday afternoon. Her pain was sharp and constant. She tried phenergan and oxycodone. It didn't help. She had N/V. She did not have fever or sick contacts. She did not have hematemesis or hematochezia. When her symptoms continued through this morning, she decided to come to the ED for evaluation She denies any other aggravating or alleviating factors.    Review of Systems: As mentioned in the history of present illness. All other systems reviewed and are negative. Past Medical History:  Diagnosis Date   Anxiety    Arthritis    rheumatoid , osteoarthritis   Bronchitis    Chronic back pain    Depression    Diabetes mellitus    Fatty liver    Fibromyalgia    GERD (gastroesophageal reflux disease)    Headache    Hypertension    Pancreatitis    Pneumonia    Rotator cuff tear, right    Past Surgical History:  Procedure Laterality Date   ABDOMINAL HYSTERECTOMY     ANTERIOR CERVICAL DECOMP/DISCECTOMY FUSION N/A 07/21/2017   Procedure: ANTERIOR CERVICAL DISCECTOMY AND FUSION C6-7, REMOVAL OF SCREW C6 PLATE, DEPUY ZERO-P IMPLANT, LOCAL BONE GRAFT, ALLOGRAFT BONE GRAFT, VIVIGEN;  Surgeon: Jessy Oto, MD;  Location: [facility];  Service: Orthopedics;  Laterality: N/A;   BACK SURGERY     laminectomy   BONE GRAFT HIP ILIAC CREST     BREAST BIOPSY     CARPAL TUNNEL RELEASE     CERVICAL DISCECTOMY  07/21/2017   CESAREAN SECTION     CHOLECYSTECTOMY     COLONOSCOPY     frontal fusion      neck fusion     patient reported   SHOULDER ARTHROSCOPY WITH SUBACROMIAL DECOMPRESSION, ROTATOR CUFF REPAIR AND BICEP TENDON REPAIR Right 10/31/2017   Procedure: RIGHT SHOULDER ARTHROSCOPY, MANIPULATION UNDER ANESTHESIA, BICEPS TENODESIS, AND MINI OPEN ROTATOR CUFF TEAR REPAIR;  Surgeon: Meredith Pel, MD;  Location: [facility];  Service: Orthopedics;  Laterality: Right;   Social History:  reports that she has been smoking cigarettes. She has a 20.00 pack-year smoking history. She has never used smokeless tobacco. She reports current drug use. Drug: Marijuana. She reports that she does not drink alcohol.  No Known Allergies  Family History  Problem Relation Age of Onset   Hypertension Father    Renal Disease Father    Diabetes Mother    Hypertension Mother    Edema Mother    Diabetes Sister    Diabetes Brother     Prior to Admission medications   Medication Sig Start Date End Date Taking? Authorizing Provider  acetaminophen (TYLENOL) 500 MG tablet Take 500 mg by mouth 2 (two) times daily as needed for moderate pain.    [provider]  albuterol (PROVENTIL) (2.5 MG/3ML) 0.083% nebulizer solution Take 3 mLs (2.5 mg total) by nebulization every 6 (six) hours as needed for wheezing or shortness of breath. 12/12/19   Tasia Catchings, Amy V, PA-C  albuterol (VENTOLIN HFA) 108 (90 Base) MCG/ACT inhaler Inhale 2 puffs  into the lungs every 4 (four) hours as needed for wheezing. 06/07/22   Francene Finders, PA-C  ALPRAZolam Duanne Moron) 1 MG tablet Take 1 mg by mouth daily as needed for anxiety. 10/23/19   [provider]  amLODipine-olmesartan (AZOR) 5-40 MG tablet Take 1 tablet by mouth daily. 06/05/21   [provider]  aspirin EC 81 MG tablet Take 81 mg by mouth daily. Swallow whole.    [provider]  cetirizine (ZYRTEC) 10 MG tablet Take 10 mg by mouth daily as needed for allergies. 09/27/20   [provider]  CREON 36000-114000 units CPEP capsule Take 36,000 Units  by mouth daily. Patient not taking: Reported on 12/27/2021 10/16/21   [provider]  cyclobenzaprine (FLEXERIL) 10 MG tablet TAKE 1 TABLET(10 MG) BY MOUTH TWICE DAILY AS NEEDED FOR MUSCLE SPASMS 02/11/22   Jessy Oto, MD  dicyclomine (BENTYL) 10 MG capsule Take 10 mg by mouth daily. 10/16/21   [provider]  escitalopram (LEXAPRO) 20 MG tablet Take 20 mg by mouth daily. 08/10/20   [provider]  fluticasone (FLONASE) 50 MCG/ACT nasal spray Place 1 spray into both nostrils daily.    [provider]  HYDROcodone-acetaminophen (NORCO/VICODIN) 5-325 MG tablet Take 1 tablet by mouth every 6 (six) hours as needed for moderate pain. 09/14/21   Mariel Aloe, MD  ibuprofen (ADVIL) 800 MG tablet Take 1 tablet (800 mg total) by mouth every 8 (eight) hours as needed (pain). 12/27/21   Barrett Henle, MD  insulin glargine (LANTUS) 100 unit/mL SOPN Inject 60 Units into the skin at bedtime.    [provider]  loperamide (IMODIUM) 2 MG capsule Take 1 capsule (2 mg total) by mouth 4 (four) times daily as needed for diarrhea or loose stools. 09/09/21   Hayden Rasmussen, MD  metFORMIN (GLUCOPHAGE) 1000 MG tablet Take 1,000 mg by mouth 2 (two) times daily. Patient not taking: Reported on 12/27/2021 03/26/21   [provider]  metoprolol succinate (TOPROL-XL) 50 MG 24 hr tablet Take 50 mg by mouth daily. 07/21/14   [provider]  ondansetron (ZOFRAN) 4 MG tablet Take 1 tablet (4 mg total) by mouth every 6 (six) hours. Patient taking differently: Take 4 mg by mouth every 8 (eight) hours as needed for vomiting or nausea. 10/15/21   Valarie Merino, MD  phentermine (ADIPEX-P) 37.5 MG tablet Take 18.75 mg by mouth daily as needed (weight gain). 09/28/21   [provider]  PRESCRIPTION MEDICATION Inject 60 Units into the skin daily. As per patient doctor changed her insulin on 10/16/21. Patient got samples from doctors office; not sure about the  name.    [provider]  simvastatin (ZOCOR) 20 MG tablet Take 20 mg by mouth daily. 08/22/20   [provider]  sitaGLIPtin (JANUVIA) 100 MG tablet Take 100 mg by mouth daily.    [provider]  traMADol (ULTRAM) 50 MG tablet Take 50 mg by mouth every 6 (six) hours as needed for moderate pain. Patient not taking: Reported on 06/07/2022    [provider]  promethazine (PHENERGAN) 25 MG tablet Take 1 tablet (25 mg total) by mouth every 6 (six) hours as needed for nausea or vomiting. Patient not taking: No sig reported 08/15/20 10/19/20  Janith Lima, [place]    Physical Exam: Vitals:   08/14/22 0624 08/14/22 0640 08/14/22 0919 08/14/22 1258  BP: (!) 155/98  (!) 144/98 (!) 146/81  Pulse: 98  Marland Kitchen)  117 96  Resp: '16  17 18  '$ Temp: 97.9 F (36.6 C)   97.8 F (36.6 C)  TempSrc: Oral  Oral Oral  SpO2: 95%  92% 96%  Weight:  96.6 kg    Height:  '5\' 4"'$  (1.626 m)     General: 57 y.o. female resting in bed in NAD Eyes: PERRL, normal sclera ENMT: Nares patent w/o discharge, orophaynx clear, dentition normal, ears w/o discharge/lesions/ulcers Neck: Supple, trachea midline Cardiovascular: RRR, +S1, S2, no m/g/r, equal pulses throughout Respiratory: CTABL, no w/r/r, normal WOB GI: BS+, ND, mild global TTP, no masses noted, no organomegaly noted MSK: No e/c/c Neuro: A&O x 3, no focal deficits Psyc: Appropriate interaction and affect, calm/cooperative  Data Reviewed:  Results for orders placed or performed during the hospital encounter of 08/14/22 (from the past 24 hour(s))  CBC with Differential     Status: Abnormal   Collection Time: 08/14/22  6:50 AM  Result Value Ref Range   WBC 11.2 (H) 4.0 - 10.5 K/uL   RBC 5.06 3.87 - 5.11 MIL/uL   Hemoglobin 13.9 12.0 - 15.0 g/dL   HCT 42.0 36.0 - 46.0 %   MCV 83.0 80.0 - 100.0 fL   MCH 27.5 26.0 - 34.0 pg   MCHC 33.1 30.0 - 36.0 g/dL   RDW 14.5 11.5 - 15.5 %   Platelets 336 150 - 400 K/uL   nRBC 0.0 0.0 - 0.2  %   Neutrophils Relative % 81 %   Neutro Abs 9.2 (H) 1.7 - 7.7 K/uL   Lymphocytes Relative 15 %   Lymphs Abs 1.7 0.7 - 4.0 K/uL   Monocytes Relative 3 %   Monocytes Absolute 0.3 0.1 - 1.0 K/uL   Eosinophils Relative 0 %   Eosinophils Absolute 0.0 0.0 - 0.5 K/uL   Basophils Relative 0 %   Basophils Absolute 0.0 0.0 - 0.1 K/uL   Immature Granulocytes 1 %   Abs Immature Granulocytes 0.06 0.00 - 0.07 K/uL  Comprehensive metabolic panel     Status: Abnormal   Collection Time: 08/14/22  6:50 AM  Result Value Ref Range   Sodium 131 (L) 135 - 145 mmol/L   Potassium 3.8 3.5 - 5.1 mmol/L   Chloride 96 (L) 98 - 111 mmol/L   CO2 23 22 - 32 mmol/L   Glucose, Bld 321 (H) 70 - 99 mg/dL   BUN 10 6 - 20 mg/dL   Creatinine, Ser 0.60 0.44 - 1.00 mg/dL   Calcium 8.7 (L) 8.9 - 10.3 mg/dL   Total Protein 7.9 6.5 - 8.1 g/dL   Albumin 4.2 3.5 - 5.0 g/dL   AST 18 15 - 41 U/L   ALT 17 0 - 44 U/L   Alkaline Phosphatase 131 (H) 38 - 126 U/L   Total Bilirubin 0.8 0.3 - 1.2 mg/dL   GFR, Estimated >60 >60 mL/min   Anion gap 12 5 - 15  Lipase, blood     Status: None   Collection Time: 08/14/22  6:50 AM  Result Value Ref Range   Lipase 40 11 - 51 U/L  Troponin I (High Sensitivity)     Status: None   Collection Time: 08/14/22  6:50 AM  Result Value Ref Range   Troponin I (High Sensitivity) 3 <18 ng/L  Troponin I (High Sensitivity)     Status: None   Collection Time: 08/14/22 10:08 AM  Result Value Ref Range   Troponin I (High Sensitivity) 3 <18 ng/L  Urinalysis, Routine w  reflex microscopic Urine, Clean Catch     Status: Abnormal   Collection Time: 08/14/22 10:10 AM  Result Value Ref Range   Color, Urine YELLOW YELLOW   APPearance CLEAR CLEAR   Specific Gravity, Urine 1.026 1.005 - 1.030   pH 5.0 5.0 - 8.0   Glucose, UA >=500 (A) NEGATIVE mg/dL   Hgb urine dipstick SMALL (A) NEGATIVE   Bilirubin Urine NEGATIVE NEGATIVE   Ketones, ur 20 (A) NEGATIVE mg/dL   Protein, ur NEGATIVE NEGATIVE mg/dL    Nitrite NEGATIVE NEGATIVE   Leukocytes,Ua NEGATIVE NEGATIVE   RBC / HPF 6-10 0 - 5 RBC/hpf   WBC, UA 0-5 0 - 5 WBC/hpf   Bacteria, UA NONE SEEN NONE SEEN   Squamous Epithelial / LPF 0-5 0 - 5 /HPF   Mucus PRESENT    Hyaline Casts, UA PRESENT   Rapid urine drug screen (hospital performed)     Status: Abnormal   Collection Time: 08/14/22 10:10 AM  Result Value Ref Range   Opiates NONE DETECTED NONE DETECTED   Cocaine NONE DETECTED NONE DETECTED   Benzodiazepines NONE DETECTED NONE DETECTED   Amphetamines NONE DETECTED NONE DETECTED   Tetrahydrocannabinol POSITIVE (A) NONE DETECTED   Barbiturates NONE DETECTED NONE DETECTED   [place] Ab complete Previous cholecystectomy.  No ductal dilatation. Some portions of the abdomen are obscured by overlapping bowel gas.  Assessment and Plan: Abdominal pain     - place in obs, med-surg     - she is refusing CT/KUB d/t radiation concerns     - LFTs and lipase are ok     - fluids, anti-emetics, judicious use of pain control     - PPI     - NPO except ice chips, sips  N/V     - anti-emetics, fluids, follow  DM2     - A1c, SSI, glucose checks     - NPO for now  HTN     - continue home regimen when confirmed  Chronic pancreatitis     - continue home regimen when confirmed  Anxiety     - continue home regimen when confirmed  HLD     - continue home regimen when confirmed  Tobacco abuse     - counsel against further use  Marijuana abuse      - counsel against further use  Advance Care Planning:   Code Status: FULL  Consults: None  Family Communication: None at bedside  Severity of Illness: The appropriate patient status for this patient is OBSERVATION. Observation status is judged to be reasonable and necessary in order to provide the required intensity of service to ensure the patient's safety. The patient's presenting symptoms, physical exam findings, and initial radiographic and laboratory data in the context of their medical  condition is felt to place them at decreased risk for further clinical deterioration. Furthermore, it is anticipated that the patient will be medically stable for discharge from the hospital within 2 midnights of admission.   Author: Jonnie Finner, DO 08/14/2022 1:39 PM  For on call review [email or web address].

## 2022-08-14 NOTE — ED Notes (Signed)
Patient refusing exam and stated " A doctor told me in the past that if I had anymore radiaton, I was going to die."  Sherrill Raring PA informed by Angelica Chessman

## 2022-08-14 NOTE — ED Notes (Signed)
ED TO INPATIENT HANDOFF REPORT  S Name/Age/Gender [name] 57 y.o. female Room/Bed: WHALC/WHALC  Code Status   Code Status: Full Code  Home/SNF/Other Home Patient oriented to: self, place, time, and situation Is this baseline? Yes   Triage Complete: Triage complete  Chief Complaint Abdominal pain [R10.9]  Triage Note Pt via EMS from home c/o 10/10 epigastric abdominal pain since yesterday. Hx DM2 and pancreatitis and pt states this feels like a flare. EMS admin 221mg fentanyl, '4mg'$  zofran, 5045mNS [facility]ga [facility] with improvement in symptoms.   BP 142/88 CBG 329 HR 88 (down from 140 on EMS arrival) EKG unremarkable    Allergies No Known Allergies  Level of Care/Admitting Diagnosis ED Disposition     ED Disposition  Admit   Condition  --   Comment  [id number]  Level of Care: Med-Surg [16]  [facility] level of care is available:: No  Covid Evaluation: Asymptomatic - no recent exposure (last 10 days) testing not required  Diagnosis: Abdominal pain [[id number]]Admitting Physician: KYJonnie Finner1[[id number]]Attending Physician: KYJonnie Finner1[[id number]]        B Medical/Surgery History Past Medical History:  Diagnosis Date   Anxiety    Arthritis    rheumatoid , osteoarthritis   Bronchitis    Chronic back pain    Depression    Diabetes mellitus    Fatty liver    Fibromyalgia    GERD (gastroesophageal reflux disease)    Headache    Hypertension    Pancreatitis    Pneumonia    Rotator cuff tear, right    Past Surgical History:  Procedure Laterality Date   ABDOMINAL HYSTERECTOMY     ANTERIOR CERVICAL DECOMP/DISCECTOMY FUSION N/A 07/21/2017   Procedure: ANTERIOR CERVICAL DISCECTOMY AND FUSION C6-7, REMOVAL OF SCREW C6 PLATE, DEPUY ZERO-P IMPLANT, LOCAL BONE GRAFT, ALLOGRAFT BONE GRAFT, VIVIGEN;  Surgeon: NiJessy OtoMD;  [facility] Service: Orthopedics;  Laterality: N/A;   BACK SURGERY     laminectomy   BONE GRAFT HIP ILIAC CREST     BREAST BIOPSY     CARPAL TUNNEL RELEASE     CERVICAL DISCECTOMY  07/21/2017   CESAREAN SECTION     CHOLECYSTECTOMY     COLONOSCOPY     frontal fusion     neck fusion     patient reported   SHOULDER ARTHROSCOPY WITH SUBACROMIAL DECOMPRESSION, ROTATOR CUFF REPAIR AND BICEP TENDON REPAIR Right 10/31/2017   Procedure: RIGHT SHOULDER ARTHROSCOPY, MANIPULATION UNDER ANESTHESIA, BICEPS TENODESIS, AND MINI OPEN ROTATOR CUFF TEAR REPAIR;  Surgeon: DeMeredith PelMD;  [facility] Service: Orthopedics;  Laterality: Right;     A IV Location/Drains/Wounds Patient Lines/Drains/Airways Status     Active Line/Drains/Airways     Name Placement date Placement time Site Days   Peripheral IV 08/14/22 20 G 1" Anterior;Proximal;Right Forearm 08/14/22  0639  Forearm  less than 1            Intake/Output Last 24 hours  Intake/Output Summary (Last 24 hours) at 08/14/2022 2133 Last data filed at 08/14/2022 1740 Gross per 24 hour  Intake 1500 ml  Output --  Net 1500 ml    Labs/Imaging Results for orders placed or performed during the hospital encounter of 08/14/22 (from the past 48 hour(s))  CBC with Differential  Status: Abnormal   Collection Time: 08/14/22  6:50 AM  Result Value Ref Range   WBC 11.2 (H) 4.0 - 10.5 K/uL   RBC 5.06 3.87 - 5.11 MIL/uL   Hemoglobin 13.9 12.0 - 15.0 g/dL   HCT 42.0 36.0 - 46.0 %   MCV 83.0 80.0 - 100.0 fL   MCH 27.5 26.0 - 34.0 pg   MCHC 33.1 30.0 - 36.0 g/dL   RDW 14.5 11.5 - 15.5 %   Platelets 336 150 - 400 K/uL   nRBC 0.0 0.0 - 0.2 %   Neutrophils Relative % 81 %   Neutro Abs 9.2 (H) 1.7 - 7.7 K/uL   Lymphocytes Relative 15 %   Lymphs Abs 1.7 0.7 - 4.0 K/uL   Monocytes Relative 3 %   Monocytes Absolute 0.3 0.1 - 1.0 K/uL   Eosinophils Relative 0 %   Eosinophils Absolute 0.0 0.0 - 0.5 K/uL   Basophils  Relative 0 %   Basophils Absolute 0.0 0.0 - 0.1 K/uL   Immature Granulocytes 1 %   Abs Immature Granulocytes 0.06 0.00 - 0.07 K/uL    Comment: Performed at [facility], [place] [address] [place], [place] [address]  Comprehensive metabolic panel     Status: Abnormal   Collection Time: 08/14/22  6:50 AM  Result Value Ref Range   Sodium 131 (L) 135 - 145 mmol/L   Potassium 3.8 3.5 - 5.1 mmol/L   Chloride 96 (L) 98 - 111 mmol/L   CO2 23 22 - 32 mmol/L   Glucose, Bld 321 (H) 70 - 99 mg/dL    Comment: Glucose reference range applies only to samples taken after fasting for at least 8 hours.   BUN 10 6 - 20 mg/dL   Creatinine, Ser 0.60 0.44 - 1.00 mg/dL   Calcium 8.7 (L) 8.9 - 10.3 mg/dL   Total Protein 7.9 6.5 - 8.1 g/dL   Albumin 4.2 3.5 - 5.0 g/dL   AST 18 15 - 41 U/L   ALT 17 0 - 44 U/L   Alkaline Phosphatase 131 (H) 38 - 126 U/L   Total Bilirubin 0.8 0.3 - 1.2 mg/dL   GFR, Estimated >60 >60 mL/min    Comment: (NOTE) Calculated using the CKD-EPI Creatinine Equation (2021)    Anion gap 12 5 - 15    Comment: Performed at [facility], [place] [address] [place], [place] [address]  Lipase, blood     Status: None   Collection Time: 08/14/22  6:50 AM  Result Value Ref Range   Lipase 40 11 - 51 U/L    Comment: Performed at [facility], [place] [address], [place], [place] [address]  Troponin I (High Sensitivity)     Status: None   Collection Time: 08/14/22  6:50 AM  Result Value Ref Range   Troponin I (High Sensitivity) 3 <18 ng/L    Comment: (NOTE) Elevated high sensitivity troponin I (hsTnI) values and significant  changes across serial measurements may suggest ACS but many other  chronic and acute conditions are known to elevate hsTnI results.  Refer to the "Links" section for chest pain algorithms and additional  guidance. Performed at [facility], [place] [address] [place], [place] [address]   Troponin I  (High Sensitivity)     Status: None   Collection Time: 08/14/22 10:08 AM  Result Value Ref Range   Troponin I (High Sensitivity) 3 <18 ng/L    Comment: (NOTE) Elevated high  sensitivity troponin I (hsTnI) values and significant  changes across serial measurements may suggest ACS but many other  chronic and acute conditions are known to elevate hsTnI results.  Refer to the "Links" section for chest pain algorithms and additional  guidance. Performed at [facility], [place] [address] [place], [place] [address]   Urinalysis, Routine w reflex microscopic Urine, Clean Catch     Status: Abnormal   Collection Time: 08/14/22 10:10 AM  Result Value Ref Range   Color, Urine YELLOW YELLOW   APPearance CLEAR CLEAR   Specific Gravity, Urine 1.026 1.005 - 1.030   pH 5.0 5.0 - 8.0   Glucose, UA >=500 (A) NEGATIVE mg/dL   Hgb urine dipstick SMALL (A) NEGATIVE   Bilirubin Urine NEGATIVE NEGATIVE   Ketones, ur 20 (A) NEGATIVE mg/dL   Protein, ur NEGATIVE NEGATIVE mg/dL   Nitrite NEGATIVE NEGATIVE   Leukocytes,Ua NEGATIVE NEGATIVE   RBC / HPF 6-10 0 - 5 RBC/hpf   WBC, UA 0-5 0 - 5 WBC/hpf   Bacteria, UA NONE SEEN NONE SEEN   Squamous Epithelial / HPF 0-5 0 - 5 /HPF   Mucus PRESENT    Hyaline Casts, UA PRESENT     Comment: Performed at [facility], [place] [address] [place], [place] [address]  Rapid urine drug screen (hospital performed)     Status: Abnormal   Collection Time: 08/14/22 10:10 AM  Result Value Ref Range   Opiates NONE DETECTED NONE DETECTED   Cocaine NONE DETECTED NONE DETECTED   Benzodiazepines NONE DETECTED NONE DETECTED   Amphetamines NONE DETECTED NONE DETECTED   Tetrahydrocannabinol POSITIVE (A) NONE DETECTED   Barbiturates NONE DETECTED NONE DETECTED    Comment: (NOTE) DRUG SCREEN FOR MEDICAL PURPOSES ONLY.  IF CONFIRMATION IS NEEDED FOR ANY PURPOSE, NOTIFY LAB WITHIN 5 DAYS.  LOWEST DETECTABLE LIMITS FOR URINE DRUG SCREEN Drug  Class                     Cutoff (ng/mL) Amphetamine and metabolites    1000 Barbiturate and metabolites    200 Benzodiazepine                 200 Opiates and metabolites        300 Cocaine and metabolites        300 THC                            50 Performed at [facility], [place] [address], [place], [place] [address]   CBG monitoring, ED     Status: Abnormal   Collection Time: 08/14/22  5:45 PM  Result Value Ref Range   Glucose-Capillary 305 (H) 70 - 99 mg/dL    Comment: Glucose reference range applies only to samples taken after fasting for at least 8 hours.   US Abdomen Complete  Result Date: 08/14/2022 CLINICAL DATA:  History of pancreatitis. Abdominal pain vomiting. Prior cholecystectomy. EXAM: ABDOMEN ULTRASOUND COMPLETE COMPARISON:  CT 06/02/2022. Ultrasound 10/17/2021. Multiple older exams as well FINDINGS: Gallbladder: Gallbladder is surgically absent as per clinical history. Common bile duct: Diameter: 4 Liver: Mildly echogenic hepatic parenchyma consistent with fatty liver infiltration. No obvious mass lesion although evaluation for underlying mass lesion is limited with echogenic parenchyma. Please correlate with the prior contrast CT scan. No intrahepatic biliary ductal dilatation. Preserved flow in the main portal vein on color Doppler. IVC: No abnormality visualized. Pancreas:  Visualized portion unremarkable. Some portions are obscured by overlapping bowel gas. Spleen: Size and appearance within normal limits. Right Kidney: Length: 9.0. Echogenicity within normal limits. No mass or hydronephrosis visualized. Left Kidney: Length: 10.0. Echogenicity within normal limits. No mass or hydronephrosis visualized. Abdominal aorta: No aneurysm visualized. Other findings: None. IMPRESSION: Previous cholecystectomy.  No ductal dilatation. Some portions of the abdomen are obscured by overlapping bowel gas. Electronically Signed   By: Jill Side M.D.   On: 08/14/2022 11:35     Pending Labs Unresulted Labs (From admission, onward)     Start     Ordered   08/15/22 0500  Comprehensive metabolic panel  Tomorrow morning,   R        08/14/22 1556   08/15/22 0500  CBC  Tomorrow morning,   R        08/14/22 1556   08/14/22 1455  Hemoglobin A1c  Once,   R       Comments: To assess prior glycemic control    08/14/22 1454            Vitals/Pain Today's Vitals   08/14/22 1258 08/14/22 1400 08/14/22 1414 08/14/22 1830  BP: (!) 146/81 (!) 113/96  118/89  Pulse: 96 90  88  Resp: '18 19  18  '$ Temp: 97.8 F (36.6 C)   97.8 F (36.6 C)  TempSrc: Oral     SpO2: 96% 97%  98%  Weight:      Height:      PainSc:   8      Isolation Precautions No active isolations  Medications Medications  iohexol (OMNIPAQUE) 300 MG/ML solution 100 mL (has no administration in time range)  metoprolol tartrate (LOPRESSOR) injection 5 mg (has no administration in time range)  prochlorperazine (COMPAZINE) injection 10 mg (has no administration in time range)  pantoprazole (PROTONIX) injection 40 mg (40 mg Intravenous Given 08/14/22 1753)  phenol (CHLORASEPTIC) mouth spray 1 spray (has no administration in time range)  insulin aspart (novoLOG) injection 0-15 Units (11 Units Subcutaneous Given 08/14/22 1756)  insulin aspart (novoLOG) injection 0-5 Units (has no administration in time range)  lactated ringers infusion ( Intravenous New Bag/Given 08/14/22 1835)  enoxaparin (LOVENOX) injection 40 mg (40 mg Subcutaneous Given 08/14/22 1758)  acetaminophen (TYLENOL) tablet 650 mg (has no administration in time range)    Or  acetaminophen (TYLENOL) suppository 650 mg (has no administration in time range)  oxyCODONE (Oxy IR/ROXICODONE) immediate release tablet 5 mg (5 mg Oral Given 08/14/22 1752)  morphine (PF) 4 MG/ML injection 4 mg (4 mg Intravenous Given 08/14/22 2002)  docusate sodium (COLACE) capsule 100 mg (has no administration in time range)  sodium chloride (PF) 0.9 % injection (  Given  by Other 08/14/22 0800)  lactated ringers bolus 1,000 mL (0 mLs Intravenous Stopped 08/14/22 1740)  ondansetron (ZOFRAN) injection 4 mg (4 mg Intravenous Given 08/14/22 1037)  HYDROmorphone (DILAUDID) injection 1 mg (1 mg Intravenous Given 08/14/22 1037)  lactated ringers bolus 1,000 mL (0 mLs Intravenous Stopped 08/14/22 1810)    Mobility walks Low fall risk    R Recommendations: See Admitting Provider Note

## 2022-08-14 NOTE — ED Provider Triage Note (Signed)
Emergency Medicine Provider Triage Evaluation Note  [name] , a 57 y.o. female  was evaluated in triage.  Pt complains of epigastric pain x 1 days. N and V and D. H/O of pancreatitis, feels similar. No CP or SOB.Marland Kitchen  Review of Systems  Per HPI  Physical Exam  BP (!) 155/98 (BP Location: Right Arm)   Pulse 98   Temp 97.9 F (36.6 C) (Oral)   Resp 16   Ht '5\' 4"'$  (1.626 m)   Wt 96.6 kg   SpO2 95%   BMI 36.56 kg/m  Gen:   Awake, no distress   Resp:  Normal effort  MSK:   Moves extremities without difficulty  Other:  Epigastric TTP  Medical Decision Making  Medically screening exam initiated at 6:52 AM.  Appropriate orders placed.  [name] was informed that the remainder of the evaluation will be completed by another provider, this initial triage assessment does not replace that evaluation, and the importance of remaining in the ED until their evaluation is complete.     Sherrill Raring, [place] 08/14/22 [phone]

## 2022-08-15 ENCOUNTER — Inpatient Hospital Stay (HOSPITAL_COMMUNITY): Payer: Medicare Other

## 2022-08-15 ENCOUNTER — Observation Stay (HOSPITAL_COMMUNITY): Payer: Medicare Other

## 2022-08-15 DIAGNOSIS — K859 Acute pancreatitis without necrosis or infection, unspecified: Secondary | ICD-10-CM | POA: Diagnosis present

## 2022-08-15 DIAGNOSIS — Z7982 Long term (current) use of aspirin: Secondary | ICD-10-CM | POA: Diagnosis not present

## 2022-08-15 DIAGNOSIS — E669 Obesity, unspecified: Secondary | ICD-10-CM

## 2022-08-15 DIAGNOSIS — R1012 Left upper quadrant pain: Secondary | ICD-10-CM

## 2022-08-15 DIAGNOSIS — R112 Nausea with vomiting, unspecified: Secondary | ICD-10-CM | POA: Diagnosis present

## 2022-08-15 DIAGNOSIS — I10 Essential (primary) hypertension: Secondary | ICD-10-CM

## 2022-08-15 DIAGNOSIS — E1165 Type 2 diabetes mellitus with hyperglycemia: Secondary | ICD-10-CM | POA: Diagnosis present

## 2022-08-15 DIAGNOSIS — F32A Depression, unspecified: Secondary | ICD-10-CM | POA: Diagnosis present

## 2022-08-15 DIAGNOSIS — F419 Anxiety disorder, unspecified: Secondary | ICD-10-CM | POA: Diagnosis present

## 2022-08-15 DIAGNOSIS — E785 Hyperlipidemia, unspecified: Secondary | ICD-10-CM | POA: Diagnosis present

## 2022-08-15 DIAGNOSIS — Z981 Arthrodesis status: Secondary | ICD-10-CM | POA: Diagnosis not present

## 2022-08-15 DIAGNOSIS — G894 Chronic pain syndrome: Secondary | ICD-10-CM | POA: Diagnosis present

## 2022-08-15 DIAGNOSIS — E1169 Type 2 diabetes mellitus with other specified complication: Secondary | ICD-10-CM | POA: Diagnosis not present

## 2022-08-15 DIAGNOSIS — Z90710 Acquired absence of both cervix and uterus: Secondary | ICD-10-CM | POA: Diagnosis not present

## 2022-08-15 DIAGNOSIS — K861 Other chronic pancreatitis: Secondary | ICD-10-CM | POA: Diagnosis present

## 2022-08-15 DIAGNOSIS — K760 Fatty (change of) liver, not elsewhere classified: Secondary | ICD-10-CM | POA: Diagnosis present

## 2022-08-15 DIAGNOSIS — F17200 Nicotine dependence, unspecified, uncomplicated: Secondary | ICD-10-CM

## 2022-08-15 DIAGNOSIS — M797 Fibromyalgia: Secondary | ICD-10-CM | POA: Diagnosis present

## 2022-08-15 DIAGNOSIS — Z79899 Other long term (current) drug therapy: Secondary | ICD-10-CM | POA: Diagnosis not present

## 2022-08-15 DIAGNOSIS — K219 Gastro-esophageal reflux disease without esophagitis: Secondary | ICD-10-CM | POA: Diagnosis present

## 2022-08-15 DIAGNOSIS — G8929 Other chronic pain: Secondary | ICD-10-CM | POA: Diagnosis present

## 2022-08-15 DIAGNOSIS — K2100 Gastro-esophageal reflux disease with esophagitis, without bleeding: Secondary | ICD-10-CM | POA: Diagnosis not present

## 2022-08-15 DIAGNOSIS — Z9049 Acquired absence of other specified parts of digestive tract: Secondary | ICD-10-CM | POA: Diagnosis not present

## 2022-08-15 DIAGNOSIS — F17210 Nicotine dependence, cigarettes, uncomplicated: Secondary | ICD-10-CM | POA: Diagnosis present

## 2022-08-15 DIAGNOSIS — Z833 Family history of diabetes mellitus: Secondary | ICD-10-CM | POA: Diagnosis not present

## 2022-08-15 DIAGNOSIS — Z8249 Family history of ischemic heart disease and other diseases of the circulatory system: Secondary | ICD-10-CM | POA: Diagnosis not present

## 2022-08-15 DIAGNOSIS — Z794 Long term (current) use of insulin: Secondary | ICD-10-CM | POA: Diagnosis not present

## 2022-08-15 DIAGNOSIS — F1210 Cannabis abuse, uncomplicated: Secondary | ICD-10-CM | POA: Diagnosis present

## 2022-08-15 DIAGNOSIS — Z841 Family history of disorders of kidney and ureter: Secondary | ICD-10-CM | POA: Diagnosis not present

## 2022-08-15 LAB — CBC
HCT: 39.9 % (ref 36.0–46.0)
Hemoglobin: 12.7 g/dL (ref 12.0–15.0)
MCH: 26.8 pg (ref 26.0–34.0)
MCHC: 31.8 g/dL (ref 30.0–36.0)
MCV: 84.4 fL (ref 80.0–100.0)
Platelets: 303 10*3/uL (ref 150–400)
RBC: 4.73 MIL/uL (ref 3.87–5.11)
RDW: 14.6 % (ref 11.5–15.5)
WBC: 9.6 10*3/uL (ref 4.0–10.5)
nRBC: 0 % (ref 0.0–0.2)

## 2022-08-15 LAB — COMPREHENSIVE METABOLIC PANEL
ALT: 13 U/L (ref 0–44)
AST: 14 U/L — ABNORMAL LOW (ref 15–41)
Albumin: 3.6 g/dL (ref 3.5–5.0)
Alkaline Phosphatase: 97 U/L (ref 38–126)
Anion gap: 8 (ref 5–15)
BUN: 13 mg/dL (ref 6–20)
CO2: 25 mmol/L (ref 22–32)
Calcium: 8.5 mg/dL — ABNORMAL LOW (ref 8.9–10.3)
Chloride: 102 mmol/L (ref 98–111)
Creatinine, Ser: 0.71 mg/dL (ref 0.44–1.00)
GFR, Estimated: >60 mL/min (ref >60)
Glucose, Bld: 238 mg/dL — ABNORMAL HIGH (ref 70–99)
Potassium: 3.7 mmol/L (ref 3.5–5.1)
Sodium: 135 mmol/L (ref 135–145)
Total Bilirubin: 0.6 mg/dL (ref 0.3–1.2)
Total Protein: 6.6 g/dL (ref 6.5–8.1)

## 2022-08-15 LAB — GLUCOSE, CAPILLARY
Glucose-Capillary: 233 mg/dL — ABNORMAL HIGH (ref 70–99)
Glucose-Capillary: 164 mg/dL — ABNORMAL HIGH (ref 70–99)
Glucose-Capillary: 302 mg/dL — ABNORMAL HIGH (ref 70–99)
Glucose-Capillary: 238 mg/dL — ABNORMAL HIGH (ref 70–99)

## 2022-08-15 MED ORDER — HYDROMORPHONE HCL 1 MG/ML IJ SOLN
0.5000 mg | Freq: Once | INTRAMUSCULAR | Status: AC
  Administered 2022-08-15: 0.5 mg via INTRAVENOUS
  Filled 2022-08-15: qty 0.5

## 2022-08-15 MED ORDER — AMLODIPINE-OLMESARTAN 5-40 MG PO TABS: 1.0000 | ORAL_TABLET | Freq: Every day | ORAL | Status: DC

## 2022-08-15 MED ORDER — METOPROLOL SUCCINATE ER 50 MG PO TB24
50.0000 mg | ORAL_TABLET | Freq: Every day | ORAL | Status: DC
  Administered 2022-08-16 – 2022-08-17 (×2): 50 mg via ORAL
  Filled 2022-08-15 (×2): qty 1

## 2022-08-15 MED ORDER — IOHEXOL 9 MG/ML PO SOLN: 500.0000 mL | ORAL | Status: AC

## 2022-08-15 MED ORDER — ESCITALOPRAM OXALATE 20 MG PO TABS
20.0000 mg | ORAL_TABLET | Freq: Every day | ORAL | Status: DC
  Administered 2022-08-16 – 2022-08-17 (×2): 20 mg via ORAL
  Filled 2022-08-15 (×3): qty 1

## 2022-08-15 MED ORDER — IOHEXOL 9 MG/ML PO SOLN
ORAL | Status: AC
  Filled 2022-08-15: qty 1000

## 2022-08-15 MED ORDER — ALBUTEROL SULFATE (2.5 MG/3ML) 0.083% IN NEBU: 2.5000 mg | INHALATION_SOLUTION | Freq: Four times a day (QID) | RESPIRATORY_TRACT | Status: DC | PRN

## 2022-08-15 MED ORDER — SODIUM CHLORIDE (PF) 0.9 % IJ SOLN
INTRAMUSCULAR | Status: AC
  Filled 2022-08-15: qty 50

## 2022-08-15 MED ORDER — CYCLOBENZAPRINE HCL 10 MG PO TABS
10.0000 mg | ORAL_TABLET | Freq: Two times a day (BID) | ORAL | Status: DC | PRN
  Administered 2022-08-16: 10 mg via ORAL
  Filled 2022-08-15: qty 1

## 2022-08-15 MED ORDER — AMLODIPINE BESYLATE 5 MG PO TABS
5.0000 mg | ORAL_TABLET | Freq: Every day | ORAL | Status: DC
  Administered 2022-08-15 – 2022-08-17 (×3): 5 mg via ORAL
  Filled 2022-08-15 (×3): qty 1

## 2022-08-15 MED ORDER — INSULIN GLARGINE-YFGN 100 UNIT/ML ~~LOC~~ SOLN
20.0000 [IU] | Freq: Every day | SUBCUTANEOUS | Status: DC
  Administered 2022-08-15 – 2022-08-16 (×2): 20 [IU] via SUBCUTANEOUS
  Filled 2022-08-15 (×3): qty 0.2

## 2022-08-15 MED ORDER — IRBESARTAN 150 MG PO TABS
300.0000 mg | ORAL_TABLET | Freq: Every day | ORAL | Status: DC
  Administered 2022-08-16 – 2022-08-17 (×2): 300 mg via ORAL
  Filled 2022-08-15 (×2): qty 2

## 2022-08-15 MED ORDER — ALPRAZOLAM 0.5 MG PO TABS
1.0000 mg | ORAL_TABLET | Freq: Every day | ORAL | Status: DC | PRN
  Administered 2022-08-16: 1 mg via ORAL
  Filled 2022-08-15: qty 2

## 2022-08-15 MED ORDER — FLUTICASONE PROPIONATE 50 MCG/ACT NA SUSP: 1.0000 | Freq: Every day | NASAL | Status: DC | PRN

## 2022-08-15 NOTE — Progress Notes (Signed)
PROGRESS NOTE    [name]  AYT:[id number] DOB: 1966-04-11 DOA: 08/14/2022 PCP: Nolene Ebbs, MD    Brief Narrative:  57 year old with history of marijuana use, hypertension, type 2 diabetes on insulin, chronic pain syndrome, anxiety and GERD, history of chronic pancreatitis is status postcholecystectomy presents with epigastric and left lateral quadrant pain with nausea and vomiting.  Patient had previous multiple admissions with abdominal pain.  Patient declined CT scan with concern of radiation exposure.   Assessment & Plan:   Intractable abdominal pain: Currently medically stable.  No evidence of peritonitis.  Abdominal ultrasound was essentially normal.  Lipase is normal. With ongoing pain, patient agreed for CT scan.  Will check CT scan abdomen pelvis with contrast. Symptomatic treatment with IV fluids, antiemetics, pain control, n.p.o. until CT scans.  She does have adequate bowel sounds.  Chronic medical issues including Type 2 diabetes on insulin, add low-dose long-acting insulin.  Keep on sliding scale insulin while NPO. Essential hypertension: Blood pressure stable.  Resume home medications including amlodipine and metoprolol. Anxiety disorder, stable.  She takes citalopram and Xanax.  Smoker and marijuana use: Counseled.  Unsure compliance.  Nicotine patch.     DVT prophylaxis: enoxaparin (LOVENOX) injection 40 mg Start: 08/14/22 1600   Code Status: Full code Family Communication: None at the bedside Disposition Plan: Status is: Observation The patient will require care spanning > 2 midnights and should be moved to inpatient because: Significant abdominal pain, not able to eat     Consultants:  None  Procedures:  None  Antimicrobials:  None   Subjective: Patient examined in the morning rounds.  Complains of pain mostly on the left lateral abdominal wall 7/10 in intensity.  Ultrasound was normal.  She wants to know what is wrong with her.  We  discussed about doing a contrasted CT scan.  She was told in the past that she will die with radiation so she was avoiding CT scans.  This is reasonable. Since patient continues to have abdominal pain and we do not have a diagnosis, we made shared decision to do CT scan as benefits outweigh risks.  Objective: Vitals:   08/14/22 2200 08/14/22 2256 08/15/22 0314 08/15/22 0920  BP: (!) 144/66 (!) 141/88 (!) 164/95 130/73  Pulse: (!) 109 (!) 105 92 90  Resp: '18 15 12 16  '$ Temp: 97.8 F (36.6 C) 98.4 F (36.9 C) 98.3 F (36.8 C) 98.2 F (36.8 C)  TempSrc: Oral Oral Oral Oral  SpO2: 94% 94% 91% 90%  Weight:      Height:        Intake/Output Summary (Last 24 hours) at 08/15/2022 1119 Last data filed at 08/15/2022 3557 Gross per 24 hour  Intake 1860.29 ml  Output 1 ml  Net 1859.29 ml   Filed Weights   08/14/22 0640  Weight: 96.6 kg    Examination:  General exam: Appears calm , slightly anxious.  Not in any distress at rest.  On room air. Respiratory system: Clear to auscultation. Respiratory effort normal. Cardiovascular system: S1 & S2 heard, RRR.  No pedal edema. Gastrointestinal system: Abdomen is nondistended, soft with diffuse tenderness mostly superficial along the middle and left lateral quadrants.  No rigidity or guarding.  No organomegaly or masses felt. Normal bowel sounds heard. Central nervous system: Alert and oriented. No focal neurological deficits. Extremities: Symmetric 5 x 5 power.    Data Reviewed: I have personally reviewed following labs and imaging studies  CBC: Recent Labs  Lab 08/14/22  3893 08/15/22 0430  WBC 11.2* 9.6  NEUTROABS 9.2*  --   HGB 13.9 12.7  HCT 42.0 39.9  MCV 83.0 84.4  PLT 336 734   Basic Metabolic Panel: Recent Labs  Lab 08/14/22 0650 08/15/22 0430  NA 131* 135  K 3.8 3.7  CL 96* 102  CO2 23 25  GLUCOSE 321* 238*  BUN 10 13  CREATININE 0.60 0.71  CALCIUM 8.7* 8.5*   GFR: Estimated Creatinine Clearance: 87.6 mL/min (by  C-G formula based on SCr of 0.71 mg/dL). Liver Function Tests: Recent Labs  Lab 08/14/22 0650 08/15/22 0430  AST 18 14*  ALT 17 13  ALKPHOS 131* 97  BILITOT 0.8 0.6  PROT 7.9 6.6  ALBUMIN 4.2 3.6   Recent Labs  Lab 08/14/22 0650  LIPASE 40   No results for input(s): "AMMONIA" in the last 168 hours. Coagulation Profile: No results for input(s): "INR", "PROTIME" in the last 168 hours. Cardiac Enzymes: No results for input(s): "CKTOTAL", "CKMB", "CKMBINDEX", "TROPONINI" in the last 168 hours. BNP (last 3 results) No results for input(s): "PROBNP" in the last 8760 hours. HbA1C: No results for input(s): "HGBA1C" in the last 72 hours. CBG: Recent Labs  Lab 08/14/22 1745 08/14/22 2205 08/15/22 0714  GLUCAP 305* 203* 233*   Lipid Profile: No results for input(s): "CHOL", "HDL", "LDLCALC", "TRIG", "CHOLHDL", "LDLDIRECT" in the last 72 hours. Thyroid Function Tests: No results for input(s): "TSH", "T4TOTAL", "FREET4", "T3FREE", "THYROIDAB" in the last 72 hours. Anemia Panel: No results for input(s): "VITAMINB12", "FOLATE", "FERRITIN", "TIBC", "IRON", "RETICCTPCT" in the last 72 hours. Sepsis Labs: No results for input(s): "PROCALCITON", "LATICACIDVEN" in the last 168 hours.  No results found for this or any previous visit (from the past 240 hour(s)).       Radiology Studies: US Abdomen Complete  Result Date: 08/14/2022 CLINICAL DATA:  History of pancreatitis. Abdominal pain vomiting. Prior cholecystectomy. EXAM: ABDOMEN ULTRASOUND COMPLETE COMPARISON:  CT 06/02/2022. Ultrasound 10/17/2021. Multiple older exams as well FINDINGS: Gallbladder: Gallbladder is surgically absent as per clinical history. Common bile duct: Diameter: 4 Liver: Mildly echogenic hepatic parenchyma consistent with fatty liver infiltration. No obvious mass lesion although evaluation for underlying mass lesion is limited with echogenic parenchyma. Please correlate with the prior contrast CT scan. No  intrahepatic biliary ductal dilatation. Preserved flow in the main portal vein on color Doppler. IVC: No abnormality visualized. Pancreas: Visualized portion unremarkable. Some portions are obscured by overlapping bowel gas. Spleen: Size and appearance within normal limits. Right Kidney: Length: 9.0. Echogenicity within normal limits. No mass or hydronephrosis visualized. Left Kidney: Length: 10.0. Echogenicity within normal limits. No mass or hydronephrosis visualized. Abdominal aorta: No aneurysm visualized. Other findings: None. IMPRESSION: Previous cholecystectomy.  No ductal dilatation. Some portions of the abdomen are obscured by overlapping bowel gas. Electronically Signed   By: Jill Side M.D.   On: 08/14/2022 11:35        Scheduled Meds:  amLODipine-olmesartan  1 tablet Oral Daily   docusate sodium  100 mg Oral BID   enoxaparin (LOVENOX) injection  40 mg Subcutaneous Q24H   escitalopram  20 mg Oral Daily   insulin aspart  0-15 Units Subcutaneous TID WC   insulin aspart  0-5 Units Subcutaneous QHS   insulin glargine-yfgn  20 Units Subcutaneous QHS   iohexol  500 mL Oral Q1H   iohexol       metoprolol succinate  50 mg Oral Daily   pantoprazole (PROTONIX) IV  40 mg Intravenous Q24H  Continuous Infusions:  lactated ringers 100 mL/hr at 08/14/22 2211     LOS: 0 days    Time spent: 35 minutes    Barb Merino, MD [facility] Pager [phone]

## 2022-08-15 NOTE — TOC CM/SW Note (Signed)
Transition of Care [facility]) Screening Note  Patient Details  Name: [name] Date of Birth: 09-Mar-1966  Transition of Care [facility]) CM/SW Contact:    Sherie Don, LCSW Phone Number: 08/15/2022, 1:14 PM  Transition of Care Department [facility]) has reviewed patient and no TOC needs have been identified at this time. We will continue to monitor patient advancement through interdisciplinary progression rounds. If new patient transition needs arise, please place a TOC consult.

## 2022-08-15 NOTE — Inpatient Diabetes Management (Signed)
Inpatient Diabetes Program Recommendations  AACE/ADA: New Consensus Statement on Inpatient Glycemic Control (2015)  Target Ranges:  Prepandial:   less than 140 mg/dL      Peak postprandial:   less than 180 mg/dL (1-2 hours)      Critically ill patients:  140 - 180 mg/dL   Lab Results  Component Value Date   GLUCAP 233 (H) 08/15/2022   HGBA1C 9.9 (H) 09/12/2021    Review of Glycemic Control  Latest Reference Range & Units 08/14/22 17:45 08/14/22 22:05 08/15/22 07:14  Glucose-Capillary 70 - 99 mg/dL 305 (H) 203 (H) 233 (H)  (H): Data is abnormally high  Diabetes history: DM2 Outpatient Diabetes medications:  Lantus 60 units QD, Januvia 100 mg QD, Metfromin 1000 mg BID (not taking) Current orders for Inpatient glycemic control: Novolog 0-15 units TID and 0-5 units QHS  Inpatient Diabetes Program Recommendations:    Noted patient is NPO  Please consider:  Semglee 20 units QD (0.15 units x 96.6 kg)  Will continue to follow while inpatient.  Thank you, Reche Dixon, MSN, [place] Diabetes Coordinator Inpatient Diabetes Program [phone] (team pager from 8a-5p)

## 2022-08-16 DIAGNOSIS — F419 Anxiety disorder, unspecified: Secondary | ICD-10-CM | POA: Diagnosis not present

## 2022-08-16 DIAGNOSIS — I10 Essential (primary) hypertension: Secondary | ICD-10-CM | POA: Diagnosis not present

## 2022-08-16 DIAGNOSIS — E1169 Type 2 diabetes mellitus with other specified complication: Secondary | ICD-10-CM | POA: Diagnosis not present

## 2022-08-16 DIAGNOSIS — R112 Nausea with vomiting, unspecified: Secondary | ICD-10-CM | POA: Diagnosis not present

## 2022-08-16 LAB — GLUCOSE, CAPILLARY
Glucose-Capillary: 229 mg/dL — ABNORMAL HIGH (ref 70–99)
Glucose-Capillary: 268 mg/dL — ABNORMAL HIGH (ref 70–99)
Glucose-Capillary: 222 mg/dL — ABNORMAL HIGH (ref 70–99)
Glucose-Capillary: 315 mg/dL — ABNORMAL HIGH (ref 70–99)

## 2022-08-16 LAB — HEMOGLOBIN A1C
Hgb A1c MFr Bld: 10.5 % — ABNORMAL HIGH (ref 4.8–5.6)
Mean Plasma Glucose: 255 mg/dL

## 2022-08-16 MED ORDER — LIVING WELL WITH DIABETES BOOK
Freq: Once | Status: AC
  Filled 2022-08-16: qty 1

## 2022-08-16 MED ORDER — MORPHINE SULFATE (PF) 2 MG/ML IV SOLN
1.0000 mg | Freq: Once | INTRAVENOUS | Status: AC
  Administered 2022-08-16: 1 mg via INTRAVENOUS
  Filled 2022-08-16: qty 1

## 2022-08-16 NOTE — Progress Notes (Signed)
PROGRESS NOTE    [name]  NGE:[id number] DOB: [birth year] DOA: 08/14/2022 PCP: Nolene Ebbs, MD    Brief Narrative:  57 year old with history of marijuana use, hypertension, type 2 diabetes on insulin, chronic pain syndrome, anxiety and GERD, history of chronic pancreatitis is status postcholecystectomy presents with epigastric and left lateral quadrant pain with nausea and vomiting.  Patient had previous multiple admissions with abdominal pain.  Patient declined CT scan with concern of radiation exposure. Subsequent CT scan was essentially normal with mild peripancreatic inflammation.  Lipase was normal.   Assessment & Plan:   Acute on chronic pancreatitis /intractable abdominal pain: Currently medically stable.  No evidence of peritonitis.  Abdominal ultrasound was essentially normal.  Lipase is normal. CT scan with IV contrast with some peripancreatic inflammation without complications.  Still has pain. Continue IV fluids, adequate IV and oral pain medications.  Will advance to full liquid diet today.  Mobilize.  Chronic medical issues including Type 2 diabetes on insulin, added low-dose long-acting insulin.  Keep on sliding scale insulin. Essential hypertension: Blood pressure stable.  Resume home medications including amlodipine and metoprolol. Anxiety disorder, stable.  She takes citalopram and Xanax.  Smoker and marijuana use: Counseled.  Unsure compliance.  Nicotine patch.     DVT prophylaxis: enoxaparin (LOVENOX) injection 40 mg Start: 08/14/22 1600   Code Status: Full code Family Communication: None at the bedside Disposition Plan: Status is: Inpatient.  Remains inpatient to monitor diet tolerance and pain control.     Consultants:  None  Procedures:  None  Antimicrobials:  None   Subjective: Patient seen and examined.  She still has occasional episodic epigastric and left lateral quadrant pain.  Denies any nausea vomiting.  She thinks she can do  some more real food.  No bowel movement since admission but urinating well.  She will try to manage her pain with oral pain medications.  Objective: Vitals:   08/15/22 1307 08/15/22 1946 08/15/22 2211 08/16/22 0432  BP: 137/81 (!) 172/91 (!) 155/99 (!) 141/77  Pulse: 100 95 80 98  Resp: '16 18  18  '$ Temp: 98.1 F (36.7 C) 98.7 F (37.1 C) 98.7 F (37.1 C) 98.7 F (37.1 C)  TempSrc: Oral Oral Oral   SpO2: 92% 91% 92% 93%  Weight:      Height:        Intake/Output Summary (Last 24 hours) at 08/16/2022 1010 Last data filed at 08/16/2022 4010 Gross per 24 hour  Intake 2911.81 ml  Output 1500 ml  Net 1411.81 ml    Filed Weights   08/14/22 0640  Weight: 96.6 kg    Examination:  General exam: Appears calm and comfortable. Respiratory system: Clear to auscultation. Respiratory effort normal. Cardiovascular system: S1 & S2 heard, RRR.  No pedal edema. Gastrointestinal system: Abdomen is nondistended, mild tenderness on palpation of left lateral quadrant without rigidity or guarding.  Bowel sound present. Central nervous system: Alert and oriented. No focal neurological deficits. Extremities: Symmetric 5 x 5 power.    Data Reviewed: I have personally reviewed following labs and imaging studies  CBC: Recent Labs  Lab 08/14/22 0650 08/15/22 0430  WBC 11.2* 9.6  NEUTROABS 9.2*  --   HGB 13.9 12.7  HCT 42.0 39.9  MCV 83.0 84.4  PLT 336 272    Basic Metabolic Panel: Recent Labs  Lab 08/14/22 0650 08/15/22 0430  NA 131* 135  K 3.8 3.7  CL 96* 102  CO2 23 25  GLUCOSE 321* 238*  BUN 10 13  CREATININE 0.60 0.71  CALCIUM 8.7* 8.5*    GFR: Estimated Creatinine Clearance: 87.6 mL/min (by C-G formula based on SCr of 0.71 mg/dL). Liver Function Tests: Recent Labs  Lab 08/14/22 0650 08/15/22 0430  AST 18 14*  ALT 17 13  ALKPHOS 131* 97  BILITOT 0.8 0.6  PROT 7.9 6.6  ALBUMIN 4.2 3.6    Recent Labs  Lab 08/14/22 0650  LIPASE 40    No results for input(s):  "AMMONIA" in the last 168 hours. Coagulation Profile: No results for input(s): "INR", "PROTIME" in the last 168 hours. Cardiac Enzymes: No results for input(s): "CKTOTAL", "CKMB", "CKMBINDEX", "TROPONINI" in the last 168 hours. BNP (last 3 results) No results for input(s): "PROBNP" in the last 8760 hours. HbA1C: Recent Labs    08/14/22 0650  HGBA1C 10.5*   CBG: Recent Labs  Lab 08/15/22 0714 08/15/22 1140 08/15/22 1631 08/15/22 2138 08/16/22 0733  GLUCAP 233* 164* 302* 238* 229*    Lipid Profile: No results for input(s): "CHOL", "HDL", "LDLCALC", "TRIG", "CHOLHDL", "LDLDIRECT" in the last 72 hours. Thyroid Function Tests: No results for input(s): "TSH", "T4TOTAL", "FREET4", "T3FREE", "THYROIDAB" in the last 72 hours. Anemia Panel: No results for input(s): "VITAMINB12", "FOLATE", "FERRITIN", "TIBC", "IRON", "RETICCTPCT" in the last 72 hours. Sepsis Labs: No results for input(s): "PROCALCITON", "LATICACIDVEN" in the last 168 hours.  No results found for this or any previous visit (from the past 240 hour(s)).       Radiology Studies: CT ABDOMEN PELVIS W CONTRAST  Result Date: 08/15/2022 CLINICAL DATA:  Intractable abdominal pain. EXAM: CT ABDOMEN AND PELVIS WITH CONTRAST TECHNIQUE: Multidetector CT imaging of the abdomen and pelvis was performed using the standard protocol following bolus administration of intravenous contrast. RADIATION DOSE REDUCTION: This exam was performed according to the departmental dose-optimization program which includes automated exposure control, adjustment of the mA and/or kV according to patient size and/or use of iterative reconstruction technique. CONTRAST:  121m OMNIPAQUE IOHEXOL 300 MG/ML  SOLN COMPARISON:  CT 06/03/2019, ultrasound yesterday. FINDINGS: Lower chest: Subsegmental atelectasis in the left greater than right lower lobe. No pneumonia. No pleural fluid. Hepatobiliary: Mild hepatic steatosis without focal liver lesion. There is small  focus of superimposed fatty deposition adjacent to the falciform ligament. Clips in the gallbladder fossa postcholecystectomy. No biliary dilatation. Pancreas: Slight indistinctness of the peripancreatic fat about the pancreatic head, for example coronal series 5, image 48. No peripancreatic fluid. Homogeneous pancreatic enhancement. No ductal dilatation or pancreatic mass. Spleen: Normal in size without focal abnormality. Adrenals/Urinary Tract: Nodularity of the left adrenal gland up to 13 mm, with a punctate calcification in the lateral limb, stable over multiple prior exams dating back to at least 2015, considered benign. Normal right adrenal gland. No hydronephrosis, perinephric edema or renal calculi. No focal renal abnormality. The urinary bladder is partially distended, normal for degree of distension. Stomach/Bowel: No bowel obstruction or inflammation. Normal appendix. Small-moderate volume of colonic stool. Vascular/Lymphatic: Normal caliber abdominal aorta, minimal atherosclerosis. Patent portal, splenic and mesenteric veins. No acute vascular findings. No enlarged lymph nodes in the abdomen pelvis. Reproductive: Status post hysterectomy. No adnexal masses. Other: No ascites. No free air or focal fluid collection. Postsurgical change of the anterior abdominal wall. Small fat containing supraumbilical ventral abdominal wall hernia. Anterior abdominal wall laxity with rectus diastasis. Musculoskeletal: Postsurgical and degenerative changes in the lower lumbar spine. Mild bilateral hip osteoarthritis. There are no acute or suspicious osseous abnormalities. IMPRESSION: 1. Slight indistinctness of the peripancreatic  fat about the pancreatic head, can be seen with acute pancreatitis. 2. Mild hepatic steatosis. 3. Small fat containing supraumbilical ventral abdominal wall hernia. Aortic Atherosclerosis (ICD10-I70.0). Electronically Signed   By: Keith Rake M.D.   On: 08/15/2022 15:16   US Abdomen  Complete  Result Date: 08/14/2022 CLINICAL DATA:  History of pancreatitis. Abdominal pain vomiting. Prior cholecystectomy. EXAM: ABDOMEN ULTRASOUND COMPLETE COMPARISON:  CT 06/02/2022. Ultrasound 10/17/2021. Multiple older exams as well FINDINGS: Gallbladder: Gallbladder is surgically absent as per clinical history. Common bile duct: Diameter: 4 Liver: Mildly echogenic hepatic parenchyma consistent with fatty liver infiltration. No obvious mass lesion although evaluation for underlying mass lesion is limited with echogenic parenchyma. Please correlate with the prior contrast CT scan. No intrahepatic biliary ductal dilatation. Preserved flow in the main portal vein on color Doppler. IVC: No abnormality visualized. Pancreas: Visualized portion unremarkable. Some portions are obscured by overlapping bowel gas. Spleen: Size and appearance within normal limits. Right Kidney: Length: 9.0. Echogenicity within normal limits. No mass or hydronephrosis visualized. Left Kidney: Length: 10.0. Echogenicity within normal limits. No mass or hydronephrosis visualized. Abdominal aorta: No aneurysm visualized. Other findings: None. IMPRESSION: Previous cholecystectomy.  No ductal dilatation. Some portions of the abdomen are obscured by overlapping bowel gas. Electronically Signed   By: Jill Side M.D.   On: 08/14/2022 11:35        Scheduled Meds:  amLODipine  5 mg Oral Daily   And   irbesartan  300 mg Oral Daily   docusate sodium  100 mg Oral BID   enoxaparin (LOVENOX) injection  40 mg Subcutaneous Q24H   escitalopram  20 mg Oral Daily   insulin aspart  0-15 Units Subcutaneous TID WC   insulin aspart  0-5 Units Subcutaneous QHS   insulin glargine-yfgn  20 Units Subcutaneous QHS   metoprolol succinate  50 mg Oral Daily   pantoprazole (PROTONIX) IV  40 mg Intravenous Q24H   Continuous Infusions:  lactated ringers 100 mL/hr at 08/15/22 1751     LOS: 1 day    Time spent: 35 minutes    Barb Merino,  MD [facility] Pager [phone]

## 2022-08-16 NOTE — Inpatient Diabetes Management (Addendum)
Inpatient Diabetes Program Recommendations  AACE/ADA: New Consensus Statement on Inpatient Glycemic Control (2015)  Target Ranges:  Prepandial:   less than 140 mg/dL      Peak postprandial:   less than 180 mg/dL (1-2 hours)      Critically ill patients:  140 - 180 mg/dL   Lab Results  Component Value Date   GLUCAP 268 (H) 08/16/2022   HGBA1C 10.5 (H) 08/14/2022    Review of Glycemic Control  Latest Reference Range & Units 08/16/22 07:33 08/16/22 11:33  Glucose-Capillary 70 - 99 mg/dL 229 (H) 268 (H)  (H): Data is abnormally high  Diabetes history: DM2 Outpatient Diabetes medications:  75/25 50 units BID, Januvia 100 mg QD, Metfromin 1000 mg BID (not taking) Current orders for Inpatient glycemic control: Novolog 0-15 units TID and 0-5 units QHS, Semglee 20 units QHS  Inpatient Diabetes Program Recommendations:    Semglee 30 units QHS   Attempted to speak with patient regarding DM regimen and A1C of 10.5%.  She recently received pain medicine and is unable to hold a conversation as she keeps falling asleep.  Ordered the Living Well with Diabetes booklet.  Will continue to follow while inpatient.  Thank you, Reche Dixon, MSN, [place] [facility] Coordinator Inpatient Diabetes Program [phone] (team pager from 8a-5p)

## 2022-08-17 DIAGNOSIS — R112 Nausea with vomiting, unspecified: Secondary | ICD-10-CM | POA: Diagnosis not present

## 2022-08-17 DIAGNOSIS — K2100 Gastro-esophageal reflux disease with esophagitis, without bleeding: Secondary | ICD-10-CM

## 2022-08-17 DIAGNOSIS — E1169 Type 2 diabetes mellitus with other specified complication: Secondary | ICD-10-CM | POA: Diagnosis not present

## 2022-08-17 DIAGNOSIS — I10 Essential (primary) hypertension: Secondary | ICD-10-CM | POA: Diagnosis not present

## 2022-08-17 LAB — GLUCOSE, CAPILLARY: Glucose-Capillary: 241 mg/dL — ABNORMAL HIGH (ref 70–99)

## 2022-08-17 MED ORDER — OXYCODONE HCL 5 MG PO TABS: 5.0000 mg | ORAL_TABLET | Freq: Four times a day (QID) | ORAL | 0 refills | Status: AC | PRN

## 2022-08-17 MED ORDER — ONDANSETRON HCL 4 MG PO TABS: 4.0000 mg | ORAL_TABLET | Freq: Three times a day (TID) | ORAL | 0 refills | Status: AC | PRN

## 2022-08-17 NOTE — Plan of Care (Signed)

## 2022-08-17 NOTE — Discharge Summary (Signed)
Physician Discharge Summary  [name] TGG:[id number] DOB: [birth year] DOA: 08/14/2022  PCP: Nolene Ebbs, MD  Admit date: 08/14/2022 Discharge date: 08/17/2022  Admitted From: Home Disposition: Home  Recommendations for Outpatient Follow-up:  Follow up with PCP in 1-2 weeks Please obtain BMP/CBC in one week   Home Health: N/A Equipment/Devices: N/A  Discharge Condition: Stable CODE STATUS: Full code Diet recommendation: Low-carb diet, liquid and soft diet.  Discharge summary: 57 year old with history of marijuana use, hypertension, type 2 diabetes on insulin, chronic pain syndrome, anxiety and GERD, history of chronic pancreatitis is status postcholecystectomy presents with epigastric and left lateral quadrant pain with nausea and vomiting.  Patient had previous multiple admissions with abdominal pain.  Patient declined CT scan with concern of radiation exposure. Subsequent CT scan was essentially normal with mild peripancreatic inflammation.  Lipase was normal.  Acute on chronic pancreatitis with intractable abdominal pain: Medically stable.  Tolerated soft diet.  Able to manage his pain with oral pain medications since last 24 hours.  Lipase is normal.  Electrolytes are adequate.  Status post cholecystectomy.  Currently no alcohol use.  With clinical improvement, she is going home with oral pain medications and nausea medications.  Chronic medical issues including type 2 diabetes on insulin, essential hypertension on amlodipine and metoprolol, anxiety disorder on citalopram and Xanax remained stable.  Patient was counseled against smoking cigarettes and smoking marijuana.  Stable for discharge.  Discharge Diagnoses:  Principal Problem:   Abdominal pain Active Problems:   Essential hypertension   Diabetes mellitus type 2 in obese (HCC)   Marijuana abuse   Tobacco dependence   Intractable nausea and vomiting   Anxiety   GERD (gastroesophageal reflux disease)   HLD  (hyperlipidemia)    Discharge Instructions  Discharge Instructions     Diet - low sodium heart healthy   Complete by: As directed    Stay on soft diet and liquid, bland diet   Increase activity slowly   Complete by: As directed       Allergies as of 08/17/2022   No Known Allergies      Medication List     TAKE these medications    acetaminophen 500 MG tablet Commonly known as: TYLENOL Take 500 mg by mouth 2 (two) times daily as needed for moderate pain.   albuterol (2.5 MG/3ML) 0.083% nebulizer solution Commonly known as: PROVENTIL Take 3 mLs (2.5 mg total) by nebulization every 6 (six) hours as needed for wheezing or shortness of breath.   albuterol 108 (90 Base) MCG/ACT inhaler Commonly known as: VENTOLIN HFA Inhale 2 puffs into the lungs every 4 (four) hours as needed for wheezing.   ALPRAZolam 1 MG tablet Commonly known as: XANAX Take 1 mg by mouth daily as needed for anxiety.   amLODipine-olmesartan 5-40 MG tablet Commonly known as: AZOR Take 1 tablet by mouth daily.   aspirin EC 81 MG tablet Take 81 mg by mouth daily. Swallow whole.   Creon 36000 UNITS Cpep capsule Generic drug: lipase/protease/amylase Take 36,000 Units by mouth daily.   cyclobenzaprine 10 MG tablet Commonly known as: FLEXERIL TAKE 1 TABLET(10 MG) BY MOUTH TWICE DAILY AS NEEDED FOR MUSCLE SPASMS What changed: See the new instructions.   dicyclomine 10 MG capsule Commonly known as: BENTYL Take 10 mg by mouth daily.   escitalopram 20 MG tablet Commonly known as: LEXAPRO Take 20 mg by mouth daily.   fluticasone 50 MCG/ACT nasal spray Commonly known as: FLONASE Place 1 spray into both  nostrils daily as needed for allergies or rhinitis.   ibuprofen 800 MG tablet Commonly known as: ADVIL Take 1 tablet (800 mg total) by mouth every 8 (eight) hours as needed (pain).   Insulin Lispro Prot & Lispro (75-25) 100 UNIT/ML Kwikpen Commonly known as: HUMALOG 75/25 MIX Inject 50 Units  into the skin 2 (two) times daily.   metoprolol succinate 50 MG 24 hr tablet Commonly known as: TOPROL-XL Take 50 mg by mouth daily.   ondansetron 4 MG tablet Commonly known as: ZOFRAN Take 1 tablet (4 mg total) by mouth every 8 (eight) hours as needed for up to 7 days for vomiting or nausea.   oxyCODONE 5 MG immediate release tablet Commonly known as: Oxy IR/ROXICODONE Take 1 tablet (5 mg total) by mouth every 6 (six) hours as needed for up to 5 days for moderate pain or severe pain.   simvastatin 20 MG tablet Commonly known as: ZOCOR Take 20 mg by mouth daily.   VITAMIN D PO Take 1 tablet by mouth daily.        No Known Allergies  Consultations: None   Procedures/Studies: CT ABDOMEN PELVIS W CONTRAST  Result Date: 08/15/2022 CLINICAL DATA:  Intractable abdominal pain. EXAM: CT ABDOMEN AND PELVIS WITH CONTRAST TECHNIQUE: Multidetector CT imaging of the abdomen and pelvis was performed using the standard protocol following bolus administration of intravenous contrast. RADIATION DOSE REDUCTION: This exam was performed according to the departmental dose-optimization program which includes automated exposure control, adjustment of the mA and/or kV according to patient size and/or use of iterative reconstruction technique. CONTRAST:  127m OMNIPAQUE IOHEXOL 300 MG/ML  SOLN COMPARISON:  CT 06/03/2019, ultrasound yesterday. FINDINGS: Lower chest: Subsegmental atelectasis in the left greater than right lower lobe. No pneumonia. No pleural fluid. Hepatobiliary: Mild hepatic steatosis without focal liver lesion. There is small focus of superimposed fatty deposition adjacent to the falciform ligament. Clips in the gallbladder fossa postcholecystectomy. No biliary dilatation. Pancreas: Slight indistinctness of the peripancreatic fat about the pancreatic head, for example coronal series 5, image 48. No peripancreatic fluid. Homogeneous pancreatic enhancement. No ductal dilatation or pancreatic  mass. Spleen: Normal in size without focal abnormality. Adrenals/Urinary Tract: Nodularity of the left adrenal gland up to 13 mm, with a punctate calcification in the lateral limb, stable over multiple prior exams dating back to at least 2015, considered benign. Normal right adrenal gland. No hydronephrosis, perinephric edema or renal calculi. No focal renal abnormality. The urinary bladder is partially distended, normal for degree of distension. Stomach/Bowel: No bowel obstruction or inflammation. Normal appendix. Small-moderate volume of colonic stool. Vascular/Lymphatic: Normal caliber abdominal aorta, minimal atherosclerosis. Patent portal, splenic and mesenteric veins. No acute vascular findings. No enlarged lymph nodes in the abdomen pelvis. Reproductive: Status post hysterectomy. No adnexal masses. Other: No ascites. No free air or focal fluid collection. Postsurgical change of the anterior abdominal wall. Small fat containing supraumbilical ventral abdominal wall hernia. Anterior abdominal wall laxity with rectus diastasis. Musculoskeletal: Postsurgical and degenerative changes in the lower lumbar spine. Mild bilateral hip osteoarthritis. There are no acute or suspicious osseous abnormalities. IMPRESSION: 1. Slight indistinctness of the peripancreatic fat about the pancreatic head, can be seen with acute pancreatitis. 2. Mild hepatic steatosis. 3. Small fat containing supraumbilical ventral abdominal wall hernia. Aortic Atherosclerosis (ICD10-I70.0). Electronically Signed   By: MKeith RakeM.D.   On: 08/15/2022 15:16   UKoreaAbdomen Complete  Result Date: 08/14/2022 CLINICAL DATA:  History of pancreatitis. Abdominal pain vomiting. Prior cholecystectomy. EXAM: ABDOMEN  ULTRASOUND COMPLETE COMPARISON:  CT 06/02/2022. Ultrasound 10/17/2021. Multiple older exams as well FINDINGS: Gallbladder: Gallbladder is surgically absent as per clinical history. Common bile duct: Diameter: 4 Liver: Mildly echogenic  hepatic parenchyma consistent with fatty liver infiltration. No obvious mass lesion although evaluation for underlying mass lesion is limited with echogenic parenchyma. Please correlate with the prior contrast CT scan. No intrahepatic biliary ductal dilatation. Preserved flow in the main portal vein on color Doppler. IVC: No abnormality visualized. Pancreas: Visualized portion unremarkable. Some portions are obscured by overlapping bowel gas. Spleen: Size and appearance within normal limits. Right Kidney: Length: 9.0. Echogenicity within normal limits. No mass or hydronephrosis visualized. Left Kidney: Length: 10.0. Echogenicity within normal limits. No mass or hydronephrosis visualized. Abdominal aorta: No aneurysm visualized. Other findings: None. IMPRESSION: Previous cholecystectomy.  No ductal dilatation. Some portions of the abdomen are obscured by overlapping bowel gas. Electronically Signed   By: Jill Side M.D.   On: 08/14/2022 11:35   (Echo, Carotid, EGD, Colonoscopy, ERCP)    Subjective: Patient seen in the morning rounds.  No overnight events.  Occasional pain especially after eating omelette and she is trying to avoid ambulate.  She was able to eat toast and coffee.  Wants to go home.  Denies any nausea vomiting.   Discharge Exam: Vitals:   08/16/22 1322 08/16/22 1941  BP: (!) 147/78 (!) 155/72  Pulse: 86 93  Resp:  18  Temp: 98.4 F (36.9 C) 98.1 F (36.7 C)  SpO2: 93% 96%   Vitals:   08/15/22 2211 08/16/22 0432 08/16/22 1322 08/16/22 1941  BP: (!) 155/99 (!) 141/77 (!) 147/78 (!) 155/72  Pulse: 80 98 86 93  Resp:  18  18  Temp: 98.7 F (37.1 C) 98.7 F (37.1 C) 98.4 F (36.9 C) 98.1 F (36.7 C)  TempSrc: Oral  Oral Oral  SpO2: 92% 93% 93% 96%  Weight:      Height:        General: Pt is alert, awake, not in acute distress Cardiovascular: RRR, S1/S2 +, no rubs, no gallops Respiratory: CTA bilaterally, no wheezing, no rhonchi Abdominal: Soft, mild tenderness mostly  superficial along the left lateral quadrants.  No rigidity.  No guarding.  ND, bowel sounds + Extremities: no edema, no cyanosis    The results of significant diagnostics from this hospitalization (including imaging, microbiology, ancillary and laboratory) are listed below for reference.     Microbiology: No results found for this or any previous visit (from the past 240 hour(s)).   Labs: BNP (last 3 results) No results for input(s): "BNP" in the last 8760 hours. Basic Metabolic Panel: Recent Labs  Lab 08/14/22 0650 08/15/22 0430  NA 131* 135  K 3.8 3.7  CL 96* 102  CO2 23 25  GLUCOSE 321* 238*  BUN 10 13  CREATININE 0.60 0.71  CALCIUM 8.7* 8.5*   Liver Function Tests: Recent Labs  Lab 08/14/22 0650 08/15/22 0430  AST 18 14*  ALT 17 13  ALKPHOS 131* 97  BILITOT 0.8 0.6  PROT 7.9 6.6  ALBUMIN 4.2 3.6   Recent Labs  Lab 08/14/22 0650  LIPASE 40   No results for input(s): "AMMONIA" in the last 168 hours. CBC: Recent Labs  Lab 08/14/22 0650 08/15/22 0430  WBC 11.2* 9.6  NEUTROABS 9.2*  --   HGB 13.9 12.7  HCT 42.0 39.9  MCV 83.0 84.4  PLT 336 303   Cardiac Enzymes: No results for input(s): "CKTOTAL", "CKMB", "CKMBINDEX", "TROPONINI" in  the last 168 hours. BNP: Invalid input(s): "POCBNP" CBG: Recent Labs  Lab 08/16/22 0733 08/16/22 1133 08/16/22 1641 08/16/22 2118 08/17/22 0739  GLUCAP 229* 268* 222* 315* 241*   D-Dimer No results for input(s): "DDIMER" in the last 72 hours. Hgb A1c No results for input(s): "HGBA1C" in the last 72 hours. Lipid Profile No results for input(s): "CHOL", "HDL", "LDLCALC", "TRIG", "CHOLHDL", "LDLDIRECT" in the last 72 hours. Thyroid function studies No results for input(s): "TSH", "T4TOTAL", "T3FREE", "THYROIDAB" in the last 72 hours.  Invalid input(s): "FREET3" Anemia work up No results for input(s): "VITAMINB12", "FOLATE", "FERRITIN", "TIBC", "IRON", "RETICCTPCT" in the last 72 hours. Urinalysis     Component Value Date/Time   COLORURINE YELLOW 08/14/2022 1010   APPEARANCEUR CLEAR 08/14/2022 1010   LABSPEC 1.026 08/14/2022 1010   PHURINE 5.0 08/14/2022 1010   GLUCOSEU >=500 (A) 08/14/2022 1010   HGBUR SMALL (A) 08/14/2022 1010   BILIRUBINUR NEGATIVE 08/14/2022 1010   KETONESUR 20 (A) 08/14/2022 1010   PROTEINUR NEGATIVE 08/14/2022 1010   UROBILINOGEN 0.2 06/26/2015 1300   NITRITE NEGATIVE 08/14/2022 1010   LEUKOCYTESUR NEGATIVE 08/14/2022 1010   Sepsis Labs Recent Labs  Lab 08/14/22 0650 08/15/22 0430  WBC 11.2* 9.6   Microbiology No results found for this or any previous visit (from the past 240 hour(s)).   Time coordinating discharge: 28 minutes  SIGNED:   Barb Merino, MD  [facility] 08/17/2022, 10:18 AM

## 2022-09-03 ENCOUNTER — Emergency Department (HOSPITAL_COMMUNITY): Payer: 59

## 2022-09-03 ENCOUNTER — Emergency Department (HOSPITAL_COMMUNITY)
Admission: EM | Admit: 2022-09-03 | Discharge: 2022-09-03 | Disposition: A | Discharge status: Home / Self Care | Payer: 59 | Source: Home / Self Care | Attending: Emergency Medicine | Admitting: Emergency Medicine

## 2022-09-03 DIAGNOSIS — R739 Hyperglycemia, unspecified: Secondary | ICD-10-CM

## 2022-09-03 DIAGNOSIS — I10 Essential (primary) hypertension: Secondary | ICD-10-CM | POA: Insufficient documentation

## 2022-09-03 DIAGNOSIS — R1013 Epigastric pain: Secondary | ICD-10-CM

## 2022-09-03 DIAGNOSIS — E1165 Type 2 diabetes mellitus with hyperglycemia: Secondary | ICD-10-CM | POA: Insufficient documentation

## 2022-09-03 DIAGNOSIS — Z7982 Long term (current) use of aspirin: Secondary | ICD-10-CM | POA: Insufficient documentation

## 2022-09-03 DIAGNOSIS — Z794 Long term (current) use of insulin: Secondary | ICD-10-CM | POA: Insufficient documentation

## 2022-09-03 DIAGNOSIS — R112 Nausea with vomiting, unspecified: Secondary | ICD-10-CM | POA: Diagnosis not present

## 2022-09-03 LAB — CBC WITH DIFFERENTIAL/PLATELET
Abs Immature Granulocytes: 0.03 10*3/uL (ref 0.00–0.07)
Basophils Absolute: 0 10*3/uL (ref 0.0–0.1)
Basophils Relative: 0 %
Eosinophils Absolute: 0 10*3/uL (ref 0.0–0.5)
Eosinophils Relative: 0 %
HCT: 42.8 % (ref 36.0–46.0)
Hemoglobin: 13.7 g/dL (ref 12.0–15.0)
Immature Granulocytes: 0 %
Lymphocytes Relative: 15 %
Lymphs Abs: 1.3 10*3/uL (ref 0.7–4.0)
MCH: 26.9 pg (ref 26.0–34.0)
MCHC: 32 g/dL (ref 30.0–36.0)
MCV: 84.1 fL (ref 80.0–100.0)
Monocytes Absolute: 0.3 10*3/uL (ref 0.1–1.0)
Monocytes Relative: 3 %
Neutro Abs: 6.7 10*3/uL (ref 1.7–7.7)
Neutrophils Relative %: 82 %
Platelets: 285 10*3/uL (ref 150–400)
RBC: 5.09 MIL/uL (ref 3.87–5.11)
RDW: 15 % (ref 11.5–15.5)
WBC: 8.4 10*3/uL (ref 4.0–10.5)
nRBC: 0 % (ref 0.0–0.2)

## 2022-09-03 LAB — URINALYSIS, ROUTINE W REFLEX MICROSCOPIC
Bacteria, UA: NONE SEEN
Bilirubin Urine: NEGATIVE
Glucose, UA: >=500 mg/dL — AB
Hgb urine dipstick: NEGATIVE
Ketones, ur: 20 mg/dL — AB
Leukocytes,Ua: NEGATIVE
Nitrite: NEGATIVE
Protein, ur: NEGATIVE mg/dL
Specific Gravity, Urine: <1.005 — ABNORMAL LOW (ref 1.005–1.030)
pH: 5 (ref 5.0–8.0)

## 2022-09-03 LAB — COMPREHENSIVE METABOLIC PANEL
ALT: 19 U/L (ref 0–44)
AST: 28 U/L (ref 15–41)
Albumin: 4.2 g/dL (ref 3.5–5.0)
Alkaline Phosphatase: 124 U/L (ref 38–126)
Anion gap: 7 (ref 5–15)
BUN: 10 mg/dL (ref 6–20)
CO2: 26 mmol/L (ref 22–32)
Calcium: 8.4 mg/dL — ABNORMAL LOW (ref 8.9–10.3)
Chloride: 99 mmol/L (ref 98–111)
Creatinine, Ser: 0.5 mg/dL (ref 0.44–1.00)
GFR, Estimated: >60 mL/min (ref >60)
Glucose, Bld: 323 mg/dL — ABNORMAL HIGH (ref 70–99)
Potassium: 5.3 mmol/L — ABNORMAL HIGH (ref 3.5–5.1)
Sodium: 132 mmol/L — ABNORMAL LOW (ref 135–145)
Total Bilirubin: 1.3 mg/dL — ABNORMAL HIGH (ref 0.3–1.2)
Total Protein: 7.8 g/dL (ref 6.5–8.1)

## 2022-09-03 LAB — CBG MONITORING, ED
Glucose-Capillary: 339 mg/dL — ABNORMAL HIGH (ref 70–99)
Glucose-Capillary: 320 mg/dL — ABNORMAL HIGH (ref 70–99)

## 2022-09-03 LAB — LIPASE, BLOOD: Lipase: 76 U/L — ABNORMAL HIGH (ref 11–51)

## 2022-09-03 MED ORDER — INSULIN ASPART 100 UNIT/ML IJ SOLN
8.0000 [IU] | Freq: Once | INTRAMUSCULAR | Status: AC
  Administered 2022-09-03: 8 [IU] via SUBCUTANEOUS
  Filled 2022-09-03: qty 0.08

## 2022-09-03 MED ORDER — SODIUM CHLORIDE 0.9 % IV SOLN
1000.0000 mL | INTRAVENOUS | Status: DC
  Administered 2022-09-03: 1000 mL via INTRAVENOUS

## 2022-09-03 MED ORDER — HYDROMORPHONE HCL 1 MG/ML IJ SOLN
1.0000 mg | Freq: Once | INTRAMUSCULAR | Status: AC
  Administered 2022-09-03: 1 mg via INTRAVENOUS
  Filled 2022-09-03: qty 1

## 2022-09-03 MED ORDER — METOCLOPRAMIDE HCL 10 MG PO TABS: 10.0000 mg | ORAL_TABLET | Freq: Three times a day (TID) | ORAL | 0 refills | Status: DC

## 2022-09-03 MED ORDER — SODIUM ZIRCONIUM CYCLOSILICATE 10 G PO PACK
10.0000 g | PACK | Freq: Once | ORAL | Status: AC
  Administered 2022-09-03: 10 g via ORAL
  Filled 2022-09-03: qty 1

## 2022-09-03 MED ORDER — SODIUM CHLORIDE 0.9 % IV BOLUS (SEPSIS)
1000.0000 mL | Freq: Once | INTRAVENOUS | Status: AC
  Administered 2022-09-03: 1000 mL via INTRAVENOUS

## 2022-09-03 MED ORDER — IOHEXOL 300 MG/ML  SOLN
100.0000 mL | Freq: Once | INTRAMUSCULAR | Status: AC | PRN
  Administered 2022-09-03: 100 mL via INTRAVENOUS

## 2022-09-03 MED ORDER — ONDANSETRON HCL 4 MG/2ML IJ SOLN
4.0000 mg | Freq: Once | INTRAMUSCULAR | Status: AC
  Administered 2022-09-03: 4 mg via INTRAVENOUS
  Filled 2022-09-03: qty 2

## 2022-09-03 NOTE — ED Provider Triage Note (Signed)
Emergency Medicine Provider Triage Evaluation Note  Kerri-Anne Haeberle , a 57 y.o. female  was evaluated in triage.  Pt complains of abdominal pain associate with nausea and vomiting that started early this morning.  Recent admission for same.  Patient has a history of pancreatitis.  Denies alcohol use. Admits to marijuana use.   Review of Systems  Positive: Abdominal pain Negative: fever  Physical Exam  BP 133/79   Pulse (!) 106   Temp 98.1 F (36.7 C)   Resp 18   SpO2 95%  Gen:   Awake, no distress   Resp:  Normal effort  MSK:   Moves extremities without difficulty  Other:  Diffuse tenderness, most significant in epigastric region  Medical Decision Making  Medically screening exam initiated at 10:19 AM.  Appropriate orders placed.  Naijah Lacek Torrence was informed that the remainder of the evaluation will be completed by another provider, this initial triage assessment does not replace that evaluation, and the importance of remaining in the ED until their evaluation is complete.  Labs EKG to rule out atypical ACS Has CT on 1/4 which showed acute pancreatitis without abscess.   Suzy Bouchard, PA-C 09/03/22 1022

## 2022-09-03 NOTE — ED Triage Notes (Signed)
BIBA c/o LLQ pain with n/v Hx pancreatitis, IDDM, Gerd Ems admin Zofran '4mg'$  and fent 162mg.  [id number] with ems.

## 2022-09-03 NOTE — Discharge Instructions (Signed)
Take the Reglan to help with the nausea and abdominal pain.  Continue medications for your diabetes and follow-up with your primary care doctor to have your blood sugar rechecked.  Take the Reglan to help with possible gastroparesis causing your recurrent episodes of abdominal pain.  Follow-up with your GI doctor for further evaluation

## 2022-09-03 NOTE — ED Notes (Signed)
Patient transported to CT 

## 2022-09-03 NOTE — ED Provider Notes (Signed)
[name] Provider Note   CSN: [id number] Arrival date & time: 09/03/22  0940     History  Chief Complaint  Patient presents with   Abdominal Pain    [name] is a 57 y.o. female.   Abdominal Pain    Patient has history of diabetes hypertension chronic back pain, pancreatitis, fibromyalgia, reflux, fatty liver disease.  Patient has prior history of an abdominal hysterectomy, cholecystectomy and anterior cervical decompression discectomy.  Patient states she started having acute onset of abdominal pain today.  Symptoms started this morning.  She has been having multiple episodes of nausea and vomiting.  Her symptoms feel similar to when she has had pancreatitis before.  Patient denies any alcohol use but does use marijuana.  No fevers no diarrhea no dysuria  Home Medications Prior to Admission medications   Medication Sig Start Date End Date Taking? Authorizing Provider  metoCLOPramide (REGLAN) 10 MG tablet Take 1 tablet (10 mg total) by mouth 3 (three) times daily before meals. 09/03/22  Yes Dorie Rank, MD  acetaminophen (TYLENOL) 500 MG tablet Take 500 mg by mouth 2 (two) times daily as needed for moderate pain.    [provider]  albuterol (PROVENTIL) (2.5 MG/3ML) 0.083% nebulizer solution Take 3 mLs (2.5 mg total) by nebulization every 6 (six) hours as needed for wheezing or shortness of breath. 12/12/19   Tasia Catchings, Amy V, PA-C  albuterol (VENTOLIN HFA) 108 (90 Base) MCG/ACT inhaler Inhale 2 puffs into the lungs every 4 (four) hours as needed for wheezing. 06/07/22   Francene Finders, PA-C  ALPRAZolam Duanne Moron) 1 MG tablet Take 1 mg by mouth daily as needed for anxiety. 10/23/19   [provider]  amLODipine-olmesartan (AZOR) 5-40 MG tablet Take 1 tablet by mouth daily. 06/05/21   [provider]  aspirin EC 81 MG tablet Take 81 mg by mouth daily. Swallow whole.    [provider]  CREON  36000-114000 units CPEP capsule Take 36,000 Units by mouth daily. Patient not taking: Reported on 12/27/2021 10/16/21   [provider]  cyclobenzaprine (FLEXERIL) 10 MG tablet TAKE 1 TABLET(10 MG) BY MOUTH TWICE DAILY AS NEEDED FOR MUSCLE SPASMS Patient taking differently: Take 10 mg by mouth 2 (two) times daily as needed for muscle spasms. 02/11/22   Jessy Oto, MD  dicyclomine (BENTYL) 10 MG capsule Take 10 mg by mouth daily. 10/16/21   [provider]  escitalopram (LEXAPRO) 20 MG tablet Take 20 mg by mouth daily. 08/10/20   [provider]  fluticasone (FLONASE) 50 MCG/ACT nasal spray Place 1 spray into both nostrils daily as needed for allergies or rhinitis.    [provider]  ibuprofen (ADVIL) 800 MG tablet Take 1 tablet (800 mg total) by mouth every 8 (eight) hours as needed (pain). 12/27/21   Barrett Henle, MD  Insulin Lispro Prot & Lispro (HUMALOG 75/25 MIX) (75-25) 100 UNIT/ML Kwikpen Inject 50 Units into the skin 2 (two) times daily. 07/09/22   [provider]  metoprolol succinate (TOPROL-XL) 50 MG 24 hr tablet Take 50 mg by mouth daily. 07/21/14   [provider]  simvastatin (ZOCOR) 20 MG tablet Take 20 mg by mouth daily. 08/22/20   [provider]  VITAMIN D PO Take 1 tablet by mouth daily.    [provider]  promethazine (PHENERGAN) 25 MG tablet Take 1 tablet (25 mg total) by mouth every 6 (six) hours as needed for  nausea or vomiting. Patient not taking: No sig reported 08/15/20 10/19/20  Wieters, Madelynn Done C, PA-C      Allergies    Patient has no known allergies.    Review of Systems   Review of Systems  Gastrointestinal:  Positive for abdominal pain.    Physical Exam Updated Vital Signs BP 136/81   Pulse (!) 109   Temp 98.5 F (36.9 C) (Oral)   Resp 17   SpO2 91%  Physical Exam Vitals and nursing note reviewed.  Constitutional:      Appearance: She is well-developed. She is ill-appearing.  HENT:      Head: Normocephalic and atraumatic.     Right Ear: External ear normal.     Left Ear: External ear normal.  Eyes:     General: No scleral icterus.       Right eye: No discharge.        Left eye: No discharge.     Conjunctiva/sclera: Conjunctivae normal.  Neck:     Trachea: No tracheal deviation.  Cardiovascular:     Rate and Rhythm: Normal rate and regular rhythm.  Pulmonary:     Effort: Pulmonary effort is normal. No respiratory distress.     Breath sounds: Normal breath sounds. No stridor. No wheezing or rales.  Abdominal:     General: Bowel sounds are normal. There is no distension.     Palpations: Abdomen is soft.     Tenderness: There is abdominal tenderness in the right upper quadrant, epigastric area and left upper quadrant. There is no guarding or rebound.  Musculoskeletal:        General: No tenderness or deformity.     Cervical back: Neck supple.  Skin:    General: Skin is warm and dry.     Findings: No rash.  Neurological:     General: No focal deficit present.     Mental Status: She is alert.     Cranial Nerves: No cranial nerve deficit, dysarthria or facial asymmetry.     Sensory: No sensory deficit.     Motor: No abnormal muscle tone or seizure activity.     Coordination: Coordination normal.  Psychiatric:        Mood and Affect: Mood normal.     ED Results / Procedures / Treatments   Labs (all labs ordered are listed, but only abnormal results are displayed) Labs Reviewed  COMPREHENSIVE METABOLIC PANEL - Abnormal; Notable for the following components:      Result Value   Sodium 132 (*)    Potassium 5.3 (*)    Glucose, Bld 323 (*)    Calcium 8.4 (*)    Total Bilirubin 1.3 (*)    All other components within normal limits  LIPASE, BLOOD - Abnormal; Notable for the following components:   Lipase 76 (*)    All other components within normal limits  URINALYSIS, ROUTINE W REFLEX MICROSCOPIC - Abnormal; Notable for the following components:   Specific  Gravity, Urine <1.005 (*)    Glucose, UA >=500 (*)    Ketones, ur 20 (*)    All other components within normal limits  CBG MONITORING, ED - Abnormal; Notable for the following components:   Glucose-Capillary 339 (*)    All other components within normal limits  CBG MONITORING, ED - Abnormal; Notable for the following components:   Glucose-Capillary 320 (*)    All other components within normal limits  CBC WITH DIFFERENTIAL/PLATELET    EKG EKG Interpretation  Date/Time:  Tuesday September 03 2022 10:29:17 EST Ventricular Rate:  107 PR Interval:  177 QRS Duration: 77 QT Interval:  362 QTC Calculation: 483 R Axis:   39 Text Interpretation: Sinus tachycardia Multiple ventricular premature complexes Consider right atrial enlargement Anterior infarct, old No significant change since last tracing Confirmed by Dorie Rank [phone]) on 09/03/2022 8:55:39 PM  Radiology CT ABDOMEN PELVIS W CONTRAST  Result Date: 09/03/2022 CLINICAL DATA:  Acute abdominal pain with nausea and vomiting. EXAM: CT ABDOMEN AND PELVIS WITH CONTRAST TECHNIQUE: Multidetector CT imaging of the abdomen and pelvis was performed using the standard protocol following bolus administration of intravenous contrast. RADIATION DOSE REDUCTION: This exam was performed according to the departmental dose-optimization program which includes automated exposure control, adjustment of the mA and/or kV according to patient size and/or use of iterative reconstruction technique. CONTRAST:  190m OMNIPAQUE IOHEXOL 300 MG/ML  SOLN COMPARISON:  CT abdomen and pelvis 08/15/2022 FINDINGS: Lower chest: No acute abnormality. Hepatobiliary: No focal liver abnormality is seen. Status post cholecystectomy. No biliary dilatation. Pancreas: Unremarkable. No pancreatic ductal dilatation or surrounding inflammatory changes. Spleen: Normal in size without focal abnormality. Adrenals/Urinary Tract: Left adrenal nodularity is unchanged. Kidneys and bladder are within  normal limits. Stomach/Bowel: Stomach is within normal limits. Appendix appears normal. No evidence of bowel wall thickening, distention, or inflammatory changes. Vascular/Lymphatic: No significant vascular findings are present. No enlarged abdominal or pelvic lymph nodes. Reproductive: Uterus and bilateral adnexa are unremarkable. Other: There is a small fat containing umbilical hernia. There is no ascites. Musculoskeletal: There are postsurgical changes at L4-L5 and L5-S1. Degenerative changes affect the spine. IMPRESSION: 1. No acute localizing process in the abdomen or pelvis. 2. Small fat containing umbilical hernia. Electronically Signed   By: ARonney AstersM.D.   On: 09/03/2022 18:56    Procedures Procedures    Medications Ordered in ED Medications  sodium chloride 0.9 % bolus 1,000 mL (1,000 mLs Intravenous New Bag/Given 09/03/22 1850)    Followed by  0.9 %  sodium chloride infusion (1,000 mLs Intravenous New Bag/Given 09/03/22 1848)  sodium zirconium cyclosilicate (LOKELMA) packet 10 g (10 g Oral Given 09/03/22 2003)  HYDROmorphone (DILAUDID) injection 1 mg (1 mg Intravenous Given 09/03/22 1743)  ondansetron (ZOFRAN) injection 4 mg (4 mg Intravenous Given 09/03/22 1743)  iohexol (OMNIPAQUE) 300 MG/ML solution 100 mL (100 mLs Intravenous Contrast Given 09/03/22 1816)  HYDROmorphone (DILAUDID) injection 1 mg (1 mg Intravenous Given 09/03/22 1857)  insulin aspart (novoLOG) injection 8 Units (8 Units Subcutaneous Given 09/03/22 2002)    ED Course/ Medical Decision Making/ A&P Clinical Course as of 09/03/22 2058  Tue Sep 03, 2022  1754 Labs reviewed.  Patient noted to have slight elevation in lipase.  CBC unremarkable.  Metabolic panel does show hyperglycemia.  Slight increase in potassium and bilirubin [JK]  1846 CBC with Differential Patient still having pain.  Additional Dilaudid ordered [JK]  1913 CT scan without acute findings [JK]  1914 Previous  records reviewed.  Patient had similar  presentation on January 3 when she was admitted to the hospital. [JK]  2018 Urinalysis, Routine w reflex microscopic Urine, Clean Catch(!) Urinalysis not suggestive of infection [JK]    Clinical Course User Index [JK] KDorie Rank MD                             Medical Decision Making Differential diagnosis includes but not limited to, pancreatitis, bowel obstruction, hepatitis  Problems Addressed: Epigastric  pain: acute illness or injury that poses a threat to life or bodily functions Hyperglycemia: acute illness or injury that poses a threat to life or bodily functions  Amount and/or Complexity of Data Reviewed Labs:  Decision-making details documented in ED Course. Radiology: independent interpretation performed.  Risk Prescription drug management.   Patient presented to the ED for recurrent bouts of abdominal pain and vomiting.  Patient has history of similar symptoms in the past.  Patient also has history of recurrent pancreatitis.  Patient's labs showed slight elevation in her lipase.  Potassium was also slightly increased but she does not have any EKG changes and I suspect that improved with IV hydration as well as her insulin.  She does not have renal failure or acidosis.  I suspect this will be transient.  He did have a CT scan there is no acute abnormality.  No obstruction.  No signs of pancreatitis.  Patient does have diabetes and was noted to be hyperglycemic.  Suspect she could be having issues with gastroparesis causing her recurrent abdominal pain.  Patient does use marijuana but infrequently.  Discussed cannabinoid hyperemesis syndrome but overall at this time favor gastroparesis.  I do think she would benefit from follow-up with her GI doctors.  Patient has been seen by doctors at [facility] in the past.  She also does have some interest of seeing a GI doctor in this area.  Patient is no longer having pain.  She has been able to eat and drink without recurrent symptoms.  She  appears appropriate for discharge at this time.        Final Clinical Impression(s) / ED Diagnoses Final diagnoses:  Epigastric pain  Hyperglycemia    Rx / DC Orders ED Discharge Orders          Ordered    metoCLOPramide (REGLAN) 10 MG tablet  3 times daily before meals        09/03/22 2054              Dorie Rank, MD 09/03/22 [phone]

## 2022-09-03 NOTE — ED Notes (Signed)
Pt was made aware for the need of a urine sample.

## 2022-09-04 ENCOUNTER — Inpatient Hospital Stay (HOSPITAL_COMMUNITY)
Admission: EM | Admit: 2022-09-04 | Discharge: 2022-09-08 | DRG: 391 | Disposition: A | Discharge status: Home / Self Care | Payer: 59 | Attending: Internal Medicine | Admitting: Internal Medicine

## 2022-09-04 DIAGNOSIS — Z90710 Acquired absence of both cervix and uterus: Secondary | ICD-10-CM

## 2022-09-04 DIAGNOSIS — Z794 Long term (current) use of insulin: Secondary | ICD-10-CM

## 2022-09-04 DIAGNOSIS — K861 Other chronic pancreatitis: Secondary | ICD-10-CM | POA: Diagnosis present

## 2022-09-04 DIAGNOSIS — E1165 Type 2 diabetes mellitus with hyperglycemia: Secondary | ICD-10-CM | POA: Diagnosis present

## 2022-09-04 DIAGNOSIS — F17210 Nicotine dependence, cigarettes, uncomplicated: Secondary | ICD-10-CM | POA: Diagnosis present

## 2022-09-04 DIAGNOSIS — Z7982 Long term (current) use of aspirin: Secondary | ICD-10-CM

## 2022-09-04 DIAGNOSIS — Z6836 Body mass index (BMI) 36.0-36.9, adult: Secondary | ICD-10-CM

## 2022-09-04 DIAGNOSIS — K760 Fatty (change of) liver, not elsewhere classified: Secondary | ICD-10-CM | POA: Diagnosis present

## 2022-09-04 DIAGNOSIS — K859 Acute pancreatitis without necrosis or infection, unspecified: Principal | ICD-10-CM | POA: Diagnosis present

## 2022-09-04 DIAGNOSIS — E785 Hyperlipidemia, unspecified: Secondary | ICD-10-CM | POA: Diagnosis present

## 2022-09-04 DIAGNOSIS — Z981 Arthrodesis status: Secondary | ICD-10-CM

## 2022-09-04 DIAGNOSIS — F419 Anxiety disorder, unspecified: Secondary | ICD-10-CM | POA: Diagnosis present

## 2022-09-04 DIAGNOSIS — M069 Rheumatoid arthritis, unspecified: Secondary | ICD-10-CM | POA: Diagnosis present

## 2022-09-04 DIAGNOSIS — M549 Dorsalgia, unspecified: Secondary | ICD-10-CM | POA: Diagnosis present

## 2022-09-04 DIAGNOSIS — Z79899 Other long term (current) drug therapy: Secondary | ICD-10-CM

## 2022-09-04 DIAGNOSIS — K8681 Exocrine pancreatic insufficiency: Secondary | ICD-10-CM | POA: Diagnosis present

## 2022-09-04 DIAGNOSIS — F1210 Cannabis abuse, uncomplicated: Secondary | ICD-10-CM | POA: Diagnosis present

## 2022-09-04 DIAGNOSIS — G894 Chronic pain syndrome: Secondary | ICD-10-CM | POA: Diagnosis present

## 2022-09-04 DIAGNOSIS — Z8249 Family history of ischemic heart disease and other diseases of the circulatory system: Secondary | ICD-10-CM

## 2022-09-04 DIAGNOSIS — K219 Gastro-esophageal reflux disease without esophagitis: Secondary | ICD-10-CM | POA: Diagnosis present

## 2022-09-04 DIAGNOSIS — M797 Fibromyalgia: Secondary | ICD-10-CM | POA: Diagnosis present

## 2022-09-04 DIAGNOSIS — E1169 Type 2 diabetes mellitus with other specified complication: Secondary | ICD-10-CM | POA: Diagnosis present

## 2022-09-04 DIAGNOSIS — Z833 Family history of diabetes mellitus: Secondary | ICD-10-CM

## 2022-09-04 DIAGNOSIS — E669 Obesity, unspecified: Secondary | ICD-10-CM | POA: Diagnosis present

## 2022-09-04 DIAGNOSIS — I10 Essential (primary) hypertension: Secondary | ICD-10-CM | POA: Diagnosis present

## 2022-09-04 DIAGNOSIS — R112 Nausea with vomiting, unspecified: Principal | ICD-10-CM | POA: Diagnosis present

## 2022-09-04 NOTE — ED Triage Notes (Signed)
Presents from home via EMS for abd pain and N/V, "feels like pancreatitis." Has had 250cc output of emesis, was unable to pick up prescribed medications from ER visit for same yesterday. No meds en route.  No intake and poor output x48 hrs.   VS: '270mg'$ /dL, 152/96, 115bpm, 28RR

## 2022-09-04 NOTE — ED Provider Triage Note (Signed)
Emergency Medicine Provider Triage Evaluation Note  Verdella Laidlaw , a 57 y.o. female  was evaluated in triage.  Pt complains of nausea, vomiting, and abdominal pain.  Seen in the ED yesterday for the same.  Recent admission for same.  Admits to marijuana use.  Review of Systems  Positive: Abdominal pain Negative: fever  Physical Exam  BP (!) 147/101 (BP Location: Left Arm)   Pulse (!) 104   Temp 98.4 F (36.9 C) (Oral)   Resp 18   SpO2 100%  Gen:   Awake, no distress   Resp:  Normal effort  MSK:   Moves extremities without difficulty  Other:    Medical Decision Making  Medically screening exam initiated at 11:49 PM.  Appropriate orders placed.  Yitzel Shasteen Loppnow was informed that the remainder of the evaluation will be completed by another provider, this initial triage assessment does not replace that evaluation, and the importance of remaining in the ED until their evaluation is complete.  Labs EKG    Suzy Bouchard, [place] 09/04/22 2350

## 2022-09-05 ENCOUNTER — Encounter (HOSPITAL_COMMUNITY): Payer: Self-pay | Admitting: Internal Medicine

## 2022-09-05 ENCOUNTER — Other Ambulatory Visit: Payer: Self-pay

## 2022-09-05 DIAGNOSIS — R112 Nausea with vomiting, unspecified: Secondary | ICD-10-CM | POA: Diagnosis not present

## 2022-09-05 LAB — CBC WITH DIFFERENTIAL/PLATELET
Abs Immature Granulocytes: 0.03 10*3/uL (ref 0.00–0.07)
Basophils Absolute: 0 10*3/uL (ref 0.0–0.1)
Basophils Relative: 0 %
Eosinophils Absolute: 0 10*3/uL (ref 0.0–0.5)
Eosinophils Relative: 0 %
HCT: 45.1 % (ref 36.0–46.0)
Hemoglobin: 15 g/dL (ref 12.0–15.0)
Immature Granulocytes: 0 %
Lymphocytes Relative: 19 %
Lymphs Abs: 1.8 10*3/uL (ref 0.7–4.0)
MCH: 27.3 pg (ref 26.0–34.0)
MCHC: 33.3 g/dL (ref 30.0–36.0)
MCV: 82 fL (ref 80.0–100.0)
Monocytes Absolute: 0.3 10*3/uL (ref 0.1–1.0)
Monocytes Relative: 3 %
Neutro Abs: 7.4 10*3/uL (ref 1.7–7.7)
Neutrophils Relative %: 78 %
Platelets: 388 10*3/uL (ref 150–400)
RBC: 5.5 MIL/uL — ABNORMAL HIGH (ref 3.87–5.11)
RDW: 14.6 % (ref 11.5–15.5)
WBC: 9.6 10*3/uL (ref 4.0–10.5)
nRBC: 0 % (ref 0.0–0.2)

## 2022-09-05 LAB — COMPREHENSIVE METABOLIC PANEL
ALT: 19 U/L (ref 0–44)
AST: 17 U/L (ref 15–41)
Albumin: 4.1 g/dL (ref 3.5–5.0)
Alkaline Phosphatase: 108 U/L (ref 38–126)
Anion gap: 13 (ref 5–15)
BUN: 12 mg/dL (ref 6–20)
CO2: 24 mmol/L (ref 22–32)
Calcium: 9.2 mg/dL (ref 8.9–10.3)
Chloride: 97 mmol/L — ABNORMAL LOW (ref 98–111)
Creatinine, Ser: 0.72 mg/dL (ref 0.44–1.00)
GFR, Estimated: >60 mL/min (ref >60)
Glucose, Bld: 313 mg/dL — ABNORMAL HIGH (ref 70–99)
Potassium: 3.5 mmol/L (ref 3.5–5.1)
Sodium: 134 mmol/L — ABNORMAL LOW (ref 135–145)
Total Bilirubin: 0.8 mg/dL (ref 0.3–1.2)
Total Protein: 7.5 g/dL (ref 6.5–8.1)

## 2022-09-05 LAB — URINALYSIS, ROUTINE W REFLEX MICROSCOPIC
Bilirubin Urine: NEGATIVE
Glucose, UA: >=500 mg/dL — AB
Hgb urine dipstick: NEGATIVE
Ketones, ur: 20 mg/dL — AB
Leukocytes,Ua: NEGATIVE
Nitrite: NEGATIVE
Protein, ur: 30 mg/dL — AB
Specific Gravity, Urine: 1.025 (ref 1.005–1.030)
pH: 6 (ref 5.0–8.0)

## 2022-09-05 LAB — RAPID URINE DRUG SCREEN, HOSP PERFORMED
Amphetamines: NOT DETECTED
Barbiturates: NOT DETECTED
Benzodiazepines: POSITIVE — AB
Cocaine: NOT DETECTED
Opiates: POSITIVE — AB
Tetrahydrocannabinol: POSITIVE — AB

## 2022-09-05 LAB — HEMOGLOBIN A1C
Hgb A1c MFr Bld: 10.3 % — ABNORMAL HIGH (ref 4.8–5.6)
Mean Plasma Glucose: 248.91 mg/dL

## 2022-09-05 LAB — CBG MONITORING, ED
Glucose-Capillary: 274 mg/dL — ABNORMAL HIGH (ref 70–99)
Glucose-Capillary: 223 mg/dL — ABNORMAL HIGH (ref 70–99)
Glucose-Capillary: 169 mg/dL — ABNORMAL HIGH (ref 70–99)

## 2022-09-05 LAB — LIPASE, BLOOD: Lipase: 58 U/L — ABNORMAL HIGH (ref 11–51)

## 2022-09-05 LAB — GLUCOSE, CAPILLARY: Glucose-Capillary: 215 mg/dL — ABNORMAL HIGH (ref 70–99)

## 2022-09-05 MED ORDER — ESCITALOPRAM OXALATE 20 MG PO TABS
20.0000 mg | ORAL_TABLET | Freq: Every day | ORAL | Status: DC
  Administered 2022-09-05 – 2022-09-08 (×4): 20 mg via ORAL
  Filled 2022-09-05 (×2): qty 1
  Filled 2022-09-05: qty 2
  Filled 2022-09-05: qty 1

## 2022-09-05 MED ORDER — INSULIN ASPART 100 UNIT/ML IJ SOLN
0.0000 [IU] | Freq: Every day | INTRAMUSCULAR | Status: DC
  Administered 2022-09-05: 2 [IU] via SUBCUTANEOUS
  Administered 2022-09-06: 4 [IU] via SUBCUTANEOUS
  Administered 2022-09-07: 3 [IU] via SUBCUTANEOUS
  Filled 2022-09-05: qty 0.05

## 2022-09-05 MED ORDER — DICYCLOMINE HCL 10 MG PO CAPS: 10.0000 mg | ORAL_CAPSULE | Freq: Every day | ORAL | Status: DC

## 2022-09-05 MED ORDER — METOPROLOL SUCCINATE ER 25 MG PO TB24
50.0000 mg | ORAL_TABLET | Freq: Every day | ORAL | Status: DC
  Administered 2022-09-05 – 2022-09-08 (×4): 50 mg via ORAL
  Filled 2022-09-05: qty 1
  Filled 2022-09-05 (×3): qty 2

## 2022-09-05 MED ORDER — ONDANSETRON HCL 4 MG/2ML IJ SOLN
4.0000 mg | Freq: Once | INTRAMUSCULAR | Status: AC
  Administered 2022-09-05: 4 mg via INTRAVENOUS
  Filled 2022-09-05: qty 2

## 2022-09-05 MED ORDER — INSULIN GLARGINE-YFGN 100 UNIT/ML ~~LOC~~ SOLN
25.0000 [IU] | Freq: Every day | SUBCUTANEOUS | Status: DC
  Administered 2022-09-05: 25 [IU] via SUBCUTANEOUS
  Filled 2022-09-05 (×2): qty 0.25

## 2022-09-05 MED ORDER — SODIUM CHLORIDE 0.9 % IV SOLN: INTRAVENOUS | Status: DC

## 2022-09-05 MED ORDER — METOCLOPRAMIDE HCL 5 MG/ML IJ SOLN
10.0000 mg | Freq: Four times a day (QID) | INTRAMUSCULAR | Status: DC
  Administered 2022-09-05 – 2022-09-08 (×12): 10 mg via INTRAVENOUS
  Filled 2022-09-05 (×13): qty 2

## 2022-09-05 MED ORDER — SODIUM CHLORIDE 0.9 % IV BOLUS
1000.0000 mL | Freq: Once | INTRAVENOUS | Status: AC
  Administered 2022-09-05: 1000 mL via INTRAVENOUS

## 2022-09-05 MED ORDER — NICOTINE 14 MG/24HR TD PT24
14.0000 mg | MEDICATED_PATCH | Freq: Every day | TRANSDERMAL | Status: DC
  Administered 2022-09-05 – 2022-09-08 (×4): 14 mg via TRANSDERMAL
  Filled 2022-09-05 (×4): qty 1

## 2022-09-05 MED ORDER — INSULIN ASPART 100 UNIT/ML IJ SOLN
10.0000 [IU] | Freq: Once | INTRAMUSCULAR | Status: AC
  Administered 2022-09-05: 10 [IU] via SUBCUTANEOUS
  Filled 2022-09-05: qty 0.1

## 2022-09-05 MED ORDER — DICYCLOMINE HCL 10 MG PO CAPS
10.0000 mg | ORAL_CAPSULE | Freq: Three times a day (TID) | ORAL | Status: DC
  Administered 2022-09-05 – 2022-09-06 (×4): 10 mg via ORAL
  Filled 2022-09-05 (×6): qty 1

## 2022-09-05 MED ORDER — FENTANYL CITRATE PF 50 MCG/ML IJ SOSY
50.0000 ug | PREFILLED_SYRINGE | INTRAMUSCULAR | Status: AC | PRN
  Administered 2022-09-05 (×2): 50 ug via INTRAVENOUS
  Filled 2022-09-05 (×2): qty 1

## 2022-09-05 MED ORDER — ACETAMINOPHEN 500 MG PO TABS: 500.0000 mg | ORAL_TABLET | Freq: Two times a day (BID) | ORAL | Status: DC | PRN

## 2022-09-05 MED ORDER — ASPIRIN 81 MG PO TBEC
81.0000 mg | DELAYED_RELEASE_TABLET | Freq: Every day | ORAL | Status: DC
  Administered 2022-09-05 – 2022-09-08 (×4): 81 mg via ORAL
  Filled 2022-09-05 (×4): qty 1

## 2022-09-05 MED ORDER — SIMVASTATIN 20 MG PO TABS
20.0000 mg | ORAL_TABLET | Freq: Every day | ORAL | Status: DC
  Administered 2022-09-05 – 2022-09-08 (×4): 20 mg via ORAL
  Filled 2022-09-05 (×4): qty 1

## 2022-09-05 MED ORDER — FENTANYL CITRATE PF 50 MCG/ML IJ SOSY
50.0000 ug | PREFILLED_SYRINGE | Freq: Once | INTRAMUSCULAR | Status: AC
  Administered 2022-09-05: 50 ug via INTRAVENOUS
  Filled 2022-09-05: qty 1

## 2022-09-05 MED ORDER — INSULIN ASPART 100 UNIT/ML IJ SOLN
0.0000 [IU] | Freq: Three times a day (TID) | INTRAMUSCULAR | Status: DC
  Administered 2022-09-05: 3 [IU] via SUBCUTANEOUS
  Administered 2022-09-06 (×2): 5 [IU] via SUBCUTANEOUS
  Administered 2022-09-06: 2 [IU] via SUBCUTANEOUS
  Administered 2022-09-07 (×2): 5 [IU] via SUBCUTANEOUS
  Administered 2022-09-07 – 2022-09-08 (×2): 3 [IU] via SUBCUTANEOUS
  Filled 2022-09-05: qty 0.15

## 2022-09-05 MED ORDER — HYDROMORPHONE HCL 1 MG/ML IJ SOLN
0.5000 mg | INTRAMUSCULAR | Status: DC | PRN
  Administered 2022-09-05 – 2022-09-07 (×8): 0.5 mg via INTRAVENOUS
  Filled 2022-09-05 (×6): qty 0.5
  Filled 2022-09-05: qty 1
  Filled 2022-09-05: qty 0.5

## 2022-09-05 MED ORDER — FLUTICASONE PROPIONATE 50 MCG/ACT NA SUSP: 1.0000 | Freq: Every day | NASAL | Status: DC | PRN

## 2022-09-05 MED ORDER — ENOXAPARIN SODIUM 40 MG/0.4ML IJ SOSY
40.0000 mg | PREFILLED_SYRINGE | Freq: Every day | INTRAMUSCULAR | Status: DC
  Administered 2022-09-05 – 2022-09-08 (×3): 40 mg via SUBCUTANEOUS
  Filled 2022-09-05 (×4): qty 0.4

## 2022-09-05 MED ORDER — AMLODIPINE-OLMESARTAN 5-40 MG PO TABS: 1.0000 | ORAL_TABLET | Freq: Every day | ORAL | Status: DC

## 2022-09-05 MED ORDER — AMLODIPINE BESYLATE 5 MG PO TABS
5.0000 mg | ORAL_TABLET | Freq: Every day | ORAL | Status: DC
  Administered 2022-09-05 – 2022-09-08 (×4): 5 mg via ORAL
  Filled 2022-09-05 (×4): qty 1

## 2022-09-05 MED ORDER — IRBESARTAN 300 MG PO TABS
300.0000 mg | ORAL_TABLET | Freq: Every day | ORAL | Status: DC
  Administered 2022-09-05 – 2022-09-08 (×4): 300 mg via ORAL
  Filled 2022-09-05 (×4): qty 1

## 2022-09-05 MED ORDER — MORPHINE SULFATE (PF) 4 MG/ML IV SOLN
4.0000 mg | Freq: Once | INTRAVENOUS | Status: AC
  Administered 2022-09-05: 4 mg via INTRAVENOUS
  Filled 2022-09-05: qty 1

## 2022-09-05 MED ORDER — PANCRELIPASE (LIP-PROT-AMYL) 36000-114000 UNITS PO CPEP
36000.0000 [IU] | ORAL_CAPSULE | Freq: Every day | ORAL | Status: DC
  Administered 2022-09-05: 36000 [IU] via ORAL
  Filled 2022-09-05 (×4): qty 1

## 2022-09-05 MED ORDER — ALBUTEROL SULFATE (2.5 MG/3ML) 0.083% IN NEBU: 2.5000 mg | INHALATION_SOLUTION | Freq: Four times a day (QID) | RESPIRATORY_TRACT | Status: DC | PRN

## 2022-09-05 MED ORDER — ALPRAZOLAM 0.25 MG PO TABS
0.2500 mg | ORAL_TABLET | Freq: Every day | ORAL | Status: DC | PRN
  Filled 2022-09-05: qty 1

## 2022-09-05 NOTE — ED Provider Notes (Signed)
[name]   CSN: [id number] Arrival date & time: 09/04/22  2335     History  Chief Complaint  Patient presents with   Abdominal Pain    Adelee Kemp is a 57 y.o. female.  57 year old with history of marijuana use, hypertension, type 2 diabetes on insulin, chronic pain syndrome, anxiety and GERD, history of chronic pancreatitis for complaints of abdominal pain, nausea, vomiting.  Chart review demonstrates patient was seen on 09/03/2022 for similar complaints and discharged from the emergency department after stable laboratory studies, IV hydration, and stable CT abdomen and pelvis demonstrating no acute process.  Patient was also seen in the emergency department on 08/14/2022 and admitted for 3-day stay with discharge on 08/17/2022 with diagnosis of acute on chronic pancreatitis, marijuana abuse, and hyperglycemia in the setting of diabetes.  Today patient...  The history is provided by the patient. No language interpreter was used.       Home Medications Prior to Admission medications   Medication Sig Start Date End Date Taking? Authorizing Provider  acetaminophen (TYLENOL) 500 MG tablet Take 500 mg by mouth 2 (two) times daily as needed for moderate pain.   Yes [provider]  albuterol (PROVENTIL) (2.5 MG/3ML) 0.083% nebulizer solution Take 3 mLs (2.5 mg total) by nebulization every 6 (six) hours as needed for wheezing or shortness of breath. 12/12/19  Yes Yu, Amy V, PA-C  albuterol (VENTOLIN HFA) 108 (90 Base) MCG/ACT inhaler Inhale 2 puffs into the lungs every 4 (four) hours as needed for wheezing. 06/07/22  Yes Francene Finders, PA-C  ALPRAZolam Duanne Moron) 1 MG tablet Take 1 mg by mouth daily as needed for anxiety. 10/23/19  Yes [provider]  amLODipine-olmesartan (AZOR) 5-40 MG tablet Take 1 tablet by mouth daily. 06/05/21  Yes [provider]  aspirin EC 81 MG tablet Take 81 mg by mouth  daily. Swallow whole.   Yes [provider]  azithromycin (ZITHROMAX) 250 MG tablet Take 250 mg by mouth as directed. 08/27/22  Yes [provider]  cyclobenzaprine (FLEXERIL) 10 MG tablet TAKE 1 TABLET(10 MG) BY MOUTH TWICE DAILY AS NEEDED FOR MUSCLE SPASMS Patient taking differently: Take 10 mg by mouth 2 (two) times daily as needed for muscle spasms. 02/11/22  Yes Jessy Oto, MD  dicyclomine (BENTYL) 10 MG capsule Take 10 mg by mouth daily. 10/16/21  Yes [provider]  escitalopram (LEXAPRO) 20 MG tablet Take 20 mg by mouth daily. 08/10/20  Yes [provider]  fluticasone (FLONASE) 50 MCG/ACT nasal spray Place 1 spray into both nostrils daily as needed for allergies or rhinitis.   Yes [provider]  Insulin Lispro Prot & Lispro (HUMALOG 75/25 MIX) (75-25) 100 UNIT/ML Kwikpen Inject 50 Units into the skin 2 (two) times daily. 07/09/22  Yes [provider]  metoprolol succinate (TOPROL-XL) 50 MG 24 hr tablet Take 50 mg by mouth daily. 07/21/14  Yes [provider]  simvastatin (ZOCOR) 20 MG tablet Take 20 mg by mouth daily. 08/22/20  Yes [provider]  VITAMIN D PO Take 1 tablet by mouth daily.   Yes [provider]  CREON 36000-114000 units CPEP capsule Take 36,000 Units by mouth daily. Patient not taking: Reported on 12/27/2021 10/16/21   [provider]  ibuprofen (ADVIL) 800 MG tablet Take 1 tablet (800 mg total) by mouth every 8 (eight) hours as needed (pain). Patient not taking: Reported on 09/05/2022 12/27/21  Barrett Henle, MD  metoCLOPramide (REGLAN) 10 MG tablet Take 1 tablet (10 mg total) by mouth 3 (three) times daily before meals. Patient not taking: Reported on 09/05/2022 09/03/22   Dorie Rank, MD  promethazine (PHENERGAN) 25 MG tablet Take 1 tablet (25 mg total) by mouth every 6 (six) hours as needed for nausea or vomiting. Patient not taking: No sig reported 08/15/20 10/19/20  Wieters, Madelynn Done  C, PA-C      Allergies    Patient has no known allergies.    Review of Systems   Review of Systems  Constitutional:  Negative for chills and fever.  HENT:  Negative for ear pain and sore throat.   Eyes:  Negative for pain and visual disturbance.  Respiratory:  Negative for cough and shortness of breath.   Cardiovascular:  Negative for chest pain and palpitations.  Gastrointestinal:  Positive for abdominal pain, nausea and vomiting.  Genitourinary:  Negative for dysuria and hematuria.  Musculoskeletal:  Negative for arthralgias and back pain.  Skin:  Negative for color change and rash.  Neurological:  Negative for seizures and syncope.  All other systems reviewed and are negative.   Physical Exam Updated Vital Signs BP (!) 146/76   Pulse 78   Temp 98.2 F (36.8 C) (Oral)   Resp 17   SpO2 100%  Physical Exam Vitals and nursing Kemp reviewed.  Constitutional:      General: She is not in acute distress.    Appearance: She is well-developed.  HENT:     Head: Normocephalic and atraumatic.  Eyes:     Conjunctiva/sclera: Conjunctivae normal.  Cardiovascular:     Rate and Rhythm: Normal rate and regular rhythm.     Heart sounds: No murmur heard. Pulmonary:     Effort: Pulmonary effort is normal. No respiratory distress.     Breath sounds: Normal breath sounds.  Abdominal:     Palpations: Abdomen is soft.     Tenderness: There is abdominal tenderness in the epigastric area. There is guarding. There is no rebound.  Musculoskeletal:        General: No swelling.     Cervical back: Neck supple.  Skin:    General: Skin is warm and dry.     Capillary Refill: Capillary refill takes less than 2 seconds.  Neurological:     Mental Status: She is alert.  Psychiatric:        Mood and Affect: Mood normal.     ED Results / Procedures / Treatments   Labs (all labs ordered are listed, but only abnormal results are displayed) Labs Reviewed  CBC WITH DIFFERENTIAL/PLATELET -  Abnormal; Notable for the following components:      Result Value   RBC 5.50 (*)    All other components within normal limits  COMPREHENSIVE METABOLIC PANEL - Abnormal; Notable for the following components:   Sodium 134 (*)    Chloride 97 (*)    Glucose, Bld 313 (*)    All other components within normal limits  LIPASE, BLOOD - Abnormal; Notable for the following components:   Lipase 58 (*)    All other components within normal limits  URINALYSIS, ROUTINE W REFLEX MICROSCOPIC - Abnormal; Notable for the following components:   Glucose, UA >=500 (*)    Ketones, ur 20 (*)    Protein, ur 30 (*)    Bacteria, UA RARE (*)    All other components within normal limits  HEMOGLOBIN A1C - Abnormal; Notable for the following  components:   Hgb A1c MFr Bld 10.3 (*)    All other components within normal limits  RAPID URINE DRUG SCREEN, HOSP PERFORMED - Abnormal; Notable for the following components:   Opiates POSITIVE (*)    Benzodiazepines POSITIVE (*)    Tetrahydrocannabinol POSITIVE (*)    All other components within normal limits  CBG MONITORING, ED - Abnormal; Notable for the following components:   Glucose-Capillary 274 (*)    All other components within normal limits  CBG MONITORING, ED - Abnormal; Notable for the following components:   Glucose-Capillary 223 (*)    All other components within normal limits    EKG EKG Interpretation  Date/Time:  Thursday September 05 2022 00:10:12 EST Ventricular Rate:  88 PR Interval:  178 QRS Duration: 95 QT Interval:  410 QTC Calculation: 497 R Axis:   -18 Text Interpretation: Sinus rhythm Probable left atrial enlargement Borderline left axis deviation Anteroseptal infarct, age indeterminate When compared with ECG of 09/03/2022, Premature ventricular complexes are no longer present Confirmed by Delora Fuel [id number]) on 09/05/2022 12:15:02 AM  Radiology CT ABDOMEN PELVIS W CONTRAST  Result Date: 09/03/2022 CLINICAL DATA:  Acute abdominal pain  with nausea and vomiting. EXAM: CT ABDOMEN AND PELVIS WITH CONTRAST TECHNIQUE: Multidetector CT imaging of the abdomen and pelvis was performed using the standard protocol following bolus administration of intravenous contrast. RADIATION DOSE REDUCTION: This exam was performed according to the departmental dose-optimization program which includes automated exposure control, adjustment of the mA and/or kV according to patient size and/or use of iterative reconstruction technique. CONTRAST:  158m OMNIPAQUE IOHEXOL 300 MG/ML  SOLN COMPARISON:  CT abdomen and pelvis 08/15/2022 FINDINGS: Lower chest: No acute abnormality. Hepatobiliary: No focal liver abnormality is seen. Status post cholecystectomy. No biliary dilatation. Pancreas: Unremarkable. No pancreatic ductal dilatation or surrounding inflammatory changes. Spleen: Normal in size without focal abnormality. Adrenals/Urinary Tract: Left adrenal nodularity is unchanged. Kidneys and bladder are within normal limits. Stomach/Bowel: Stomach is within normal limits. Appendix appears normal. No evidence of bowel wall thickening, distention, or inflammatory changes. Vascular/Lymphatic: No significant vascular findings are present. No enlarged abdominal or pelvic lymph nodes. Reproductive: Uterus and bilateral adnexa are unremarkable. Other: There is a small fat containing umbilical hernia. There is no ascites. Musculoskeletal: There are postsurgical changes at L4-L5 and L5-S1. Degenerative changes affect the spine. IMPRESSION: 1. No acute localizing process in the abdomen or pelvis. 2. Small fat containing umbilical hernia. Electronically Signed   By: ARonney AstersM.D.   On: 09/03/2022 18:56    Procedures Procedures    Medications Ordered in ED Medications  acetaminophen (TYLENOL) tablet 500 mg (has no administration in time range)  aspirin EC tablet 81 mg (81 mg Oral Given 09/05/22 1505)  metoprolol succinate (TOPROL-XL) 24 hr tablet 50 mg (50 mg Oral Given  09/05/22 1505)  simvastatin (ZOCOR) tablet 20 mg (20 mg Oral Given 09/05/22 1505)  ALPRAZolam (XANAX) tablet 0.25 mg (has no administration in time range)  escitalopram (LEXAPRO) tablet 20 mg (20 mg Oral Given 09/05/22 1505)  lipase/protease/amylase (CREON) capsule 36,000 Units (36,000 Units Oral Given 09/05/22 1506)  albuterol (PROVENTIL) (2.5 MG/3ML) 0.083% nebulizer solution 2.5 mg (has no administration in time range)  fluticasone (FLONASE) 50 MCG/ACT nasal spray 1 spray (has no administration in time range)  enoxaparin (LOVENOX) injection 40 mg (40 mg Subcutaneous Given 09/05/22 1505)  0.9 %  sodium chloride infusion ( Intravenous New Bag/Given 09/05/22 1503)  insulin aspart (novoLOG) injection 0-15 Units (has no administration in  time range)  insulin aspart (novoLOG) injection 0-5 Units (has no administration in time range)  insulin glargine-yfgn (SEMGLEE) injection 25 Units (has no administration in time range)  HYDROmorphone (DILAUDID) injection 0.5 mg (has no administration in time range)  metoCLOPramide (REGLAN) injection 10 mg (10 mg Intravenous Given 09/05/22 1503)  dicyclomine (BENTYL) capsule 10 mg (has no administration in time range)  nicotine (NICODERM CQ - dosed in mg/24 hours) patch 14 mg (14 mg Transdermal Patch Applied 09/05/22 1504)  amLODipine (NORVASC) tablet 5 mg (5 mg Oral Given 09/05/22 1505)    And  irbesartan (AVAPRO) tablet 300 mg (300 mg Oral Given 09/05/22 1506)  fentaNYL (SUBLIMAZE) injection 50 mcg (50 mcg Intravenous Given 09/05/22 1349)  ondansetron (ZOFRAN) injection 4 mg (4 mg Intravenous Given 09/05/22 0013)  sodium chloride 0.9 % bolus 1,000 mL (0 mLs Intravenous Stopped 09/05/22 1110)  ondansetron (ZOFRAN) injection 4 mg (4 mg Intravenous Given 09/05/22 0801)  fentaNYL (SUBLIMAZE) injection 50 mcg (50 mcg Intravenous Given 09/05/22 0801)  morphine (PF) 4 MG/ML injection 4 mg (4 mg Intravenous Given 09/05/22 1128)  insulin aspart (novoLOG) injection 10 Units (10 Units  Subcutaneous Given 09/05/22 1131)    ED Course/ Medical Decision Making/ A&P                             Medical Decision Making Risk Prescription drug management. Decision regarding hospitalization.   58 year old with history of marijuana use, hypertension, type 2 diabetes on insulin, chronic pain syndrome, anxiety and GERD, history of chronic pancreatitis for complaints of abdominal pain, nausea, vomiting.  Patient alert and oriented x 3, no acute distress, afebrile, some vital signs.  Abdomen is soft with tenderness to palpation in the epigastric region with guarding.  No distention.  Laboratory studies are stable.  No leukocytosis.  No signs or symptoms of sepsis.  Stable liver profile and renal function.  Lipase elevated but down from previous admission.  Patient given Zofran and fentanyl from triage approximately 4 hours ago with some improvement but symptoms have returned.  IV fluids, Zofran, and fentanyl redosed.   On reevaluation patient admits to improvement of nausea and vomiting at this time.  She does still have epigastric abdominal pain.  Morphine given.  Patient just had CT abdomen pelvis approximately 2 days ago.  I will not be reordering further imaging at this time to decrease radiation exposure.  Suspicion for any acute life-threatening etiologies of abdominal pain..  At this time I think symptoms are likely secondary to chronic pancreatitis versus marijuana induced cyclical vomiting syndrome.  Patient accepted by admitting team for symptomatic management and multiple failed doses of pain medications.  Patient agreeable with plan.  Minimal improvement of hyperglycemia after IV fluid bolus.  10 units IV insulin given.  Patient will likely require sliding scale during admission.  Takes insulin at home.        Final Clinical Impression(s) / ED Diagnoses Final diagnoses:  Acute on chronic pancreatitis ([place])  Hyperglycemia due to diabetes mellitus [facility])    Rx / DC  Orders ED Discharge Orders     None         Lianne Cure, DO 78/46/96 [id number]

## 2022-09-05 NOTE — H&P (Signed)
History and Physical    [name] OXB:[id number] DOB: 08-20-1965 DOA: 09/04/2022  PCP: Nolene Ebbs, MD   Patient coming from: Home    Chief Complaint: Nausea, vomiting, abdominal pain  HPI: [name] is a 57 y.o. female with medical history significant of type 2 diabetes, hypertension, marijuana use, chronic pain syndrome, chronic pancreatitis who presented to the emergency department with abdominal pain, nausea or vomiting.  She was seen for same complaints on 09/03/2022 but was discharged after symptomatic management.  Patient went home but came back with persistent symptoms.  Unable to tolerate anything by mouth.  Patient has history of marijuana intake since decades. Smoke half packs a day.No current or past H/O alcohol abuse. Patient has multiple admissions for the same in the past, the last one being in 08/14/2022.  Her abdominal pain was mainly attributed for acute on chronic pancreatitis and also secondary to marijuana abuse and she was strictly counseled against further use but she has continued using it. Patient seen and examined at bedside today.  Hemodynamically stable during my evaluation, mildlky hypertensive.  She denies any complaint of chest pain, shortness of breath, palpitations, hematochezia, melena, diarrhea or dysuria.  ED Course: Mildly hypertensive in the emergency department.  Lab work showed sodium of 134.  Lipase of 58.  CT abdomen/pelvis done on 1/23 did not show any acute intra-abdominal findings.  Patient being requested to be admitted for intractable nausea and vomiting with abdominal pain and unable to tolerate anything by mouth.  Review of Systems: As per HPI otherwise 10 point review of systems negative.    Past Medical History:  Diagnosis Date   Anxiety    Arthritis    rheumatoid , osteoarthritis   Bronchitis    Chronic back pain    Depression    Diabetes mellitus    Fatty liver    Fibromyalgia    GERD (gastroesophageal reflux  disease)    Headache    Hypertension    Pancreatitis    Pneumonia    Rotator cuff tear, right     Past Surgical History:  Procedure Laterality Date   ABDOMINAL HYSTERECTOMY     ANTERIOR CERVICAL DECOMP/DISCECTOMY FUSION N/A 07/21/2017   Procedure: ANTERIOR CERVICAL DISCECTOMY AND FUSION C6-7, REMOVAL OF SCREW C6 PLATE, DEPUY ZERO-P IMPLANT, LOCAL BONE GRAFT, ALLOGRAFT BONE GRAFT, VIVIGEN;  Surgeon: Jessy Oto, MD;  Location: [facility];  Service: Orthopedics;  Laterality: N/A;   BACK SURGERY     laminectomy   BONE GRAFT HIP ILIAC CREST     BREAST BIOPSY     CARPAL TUNNEL RELEASE     CERVICAL DISCECTOMY  07/21/2017   CESAREAN SECTION     CHOLECYSTECTOMY     COLONOSCOPY     frontal fusion     neck fusion     patient reported   SHOULDER ARTHROSCOPY WITH SUBACROMIAL DECOMPRESSION, ROTATOR CUFF REPAIR AND BICEP TENDON REPAIR Right 10/31/2017   Procedure: RIGHT SHOULDER ARTHROSCOPY, MANIPULATION UNDER ANESTHESIA, BICEPS TENODESIS, AND MINI OPEN ROTATOR CUFF TEAR REPAIR;  Surgeon: Meredith Pel, MD;  Location: [facility];  Service: Orthopedics;  Laterality: Right;     reports that she has been smoking cigarettes. She has a 20.00 pack-year smoking history. She has never used smokeless tobacco. She reports current drug use. Drug: Marijuana. She reports that she does not drink alcohol.  No Known Allergies  Family History  Problem Relation Age of Onset   Hypertension Father    Renal Disease Father  Diabetes Mother    Hypertension Mother    Edema Mother    Diabetes Sister    Diabetes Brother      Prior to Admission medications   Medication Sig Start Date End Date Taking? Authorizing Provider  acetaminophen (TYLENOL) 500 MG tablet Take 500 mg by mouth 2 (two) times daily as needed for moderate pain.    [provider]  albuterol (PROVENTIL) (2.5 MG/3ML) 0.083% nebulizer solution Take 3 mLs (2.5 mg total) by nebulization every 6 (six) hours as needed for wheezing or  shortness of breath. 12/12/19   Tasia Catchings, Amy V, PA-C  albuterol (VENTOLIN HFA) 108 (90 Base) MCG/ACT inhaler Inhale 2 puffs into the lungs every 4 (four) hours as needed for wheezing. 06/07/22   Francene Finders, PA-C  ALPRAZolam Duanne Moron) 1 MG tablet Take 1 mg by mouth daily as needed for anxiety. 10/23/19   [provider]  amLODipine-olmesartan (AZOR) 5-40 MG tablet Take 1 tablet by mouth daily. 06/05/21   [provider]  aspirin EC 81 MG tablet Take 81 mg by mouth daily. Swallow whole.    [provider]  CREON 36000-114000 units CPEP capsule Take 36,000 Units by mouth daily. Patient not taking: Reported on 12/27/2021 10/16/21   [provider]  cyclobenzaprine (FLEXERIL) 10 MG tablet TAKE 1 TABLET(10 MG) BY MOUTH TWICE DAILY AS NEEDED FOR MUSCLE SPASMS Patient taking differently: Take 10 mg by mouth 2 (two) times daily as needed for muscle spasms. 02/11/22   Jessy Oto, MD  dicyclomine (BENTYL) 10 MG capsule Take 10 mg by mouth daily. 10/16/21   [provider]  escitalopram (LEXAPRO) 20 MG tablet Take 20 mg by mouth daily. 08/10/20   [provider]  fluticasone (FLONASE) 50 MCG/ACT nasal spray Place 1 spray into both nostrils daily as needed for allergies or rhinitis.    [provider]  ibuprofen (ADVIL) 800 MG tablet Take 1 tablet (800 mg total) by mouth every 8 (eight) hours as needed (pain). 12/27/21   Barrett Henle, MD  Insulin Lispro Prot & Lispro (HUMALOG 75/25 MIX) (75-25) 100 UNIT/ML Kwikpen Inject 50 Units into the skin 2 (two) times daily. 07/09/22   [provider]  metoCLOPramide (REGLAN) 10 MG tablet Take 1 tablet (10 mg total) by mouth 3 (three) times daily before meals. 09/03/22   Dorie Rank, MD  metoprolol succinate (TOPROL-XL) 50 MG 24 hr tablet Take 50 mg by mouth daily. 07/21/14   [provider]  simvastatin (ZOCOR) 20 MG tablet Take 20 mg by mouth daily. 08/22/20   [provider]  VITAMIN D  PO Take 1 tablet by mouth daily.    [provider]  promethazine (PHENERGAN) 25 MG tablet Take 1 tablet (25 mg total) by mouth every 6 (six) hours as needed for nausea or vomiting. Patient not taking: No sig reported 08/15/20 10/19/20  Janith Lima, [place]    Physical Exam: Vitals:   09/05/22 0800 09/05/22 0935 09/05/22 1114 09/05/22 1115  BP: (!) 152/87 (!) 152/87 (!) 172/98   Pulse: 95 91 86 86  Resp: 16 (!) '22 18 14  '$ Temp:   98 F (36.7 C)   TempSrc:   Oral   SpO2: 100% 96% 100% 100%    Constitutional: in mild to moderate distress due to abd pain,obese Vitals:   09/05/22 0800 09/05/22 0935 09/05/22 1114 09/05/22 1115  BP: (!) 152/87 (!) 152/87 (!) 172/98   Pulse: 95 91 86 86  Resp: 16 (!)  $'22 18 14  'K$ Temp:   98 F (36.7 C)   TempSrc:   Oral   SpO2: 100% 96% 100% 100%   Eyes: PERRL, lids and conjunctivae normal ENMT: Mucous membranes are moist.  Neck: normal, supple, no masses, no thyromegaly Respiratory: clear to auscultation bilaterally, no wheezing, no crackles. Normal respiratory effort. No accessory muscle use.  Cardiovascular: Regular rate and rhythm, no murmurs / rubs / gallops. No extremity edema.  Abdomen: epigastric tenderness, no masses palpated. No hepatosplenomegaly. Bowel sounds positive.  Musculoskeletal: no clubbing / cyanosis. No joint deformity upper and lower extremities.  Skin: no rashes, lesions, ulcers. No induration Neurologic: CN 2-12 grossly intact.  Strength 5/5 in all 4.  Psychiatric: Normal judgment and insight. Alert and oriented x 3. Normal mood.   Foley Catheter:None  Labs on Admission: I have personally reviewed following labs and imaging studies  CBC: Recent Labs  Lab 09/03/22 1042 09/04/22 0010  WBC 8.4 9.6  NEUTROABS 6.7 7.4  HGB 13.7 15.0  HCT 42.8 45.1  MCV 84.1 82.0  PLT 285 938   Basic Metabolic Panel: Recent Labs  Lab 09/03/22 1042 09/04/22 0010  NA 132* 134*  K 5.3* 3.5  CL 99 97*  CO2 26 24  GLUCOSE  323* 313*  BUN 10 12  CREATININE 0.50 0.72  CALCIUM 8.4* 9.2   GFR: CrCl cannot be calculated (Unknown ideal weight.). Liver Function Tests: Recent Labs  Lab 09/03/22 1042 09/04/22 0010  AST 28 17  ALT 19 19  ALKPHOS 124 108  BILITOT 1.3* 0.8  PROT 7.8 7.5  ALBUMIN 4.2 4.1   Recent Labs  Lab 09/03/22 1042 09/04/22 0010  LIPASE 76* 58*   No results for input(s): "AMMONIA" in the last 168 hours. Coagulation Profile: No results for input(s): "INR", "PROTIME" in the last 168 hours. Cardiac Enzymes: No results for input(s): "CKTOTAL", "CKMB", "CKMBINDEX", "TROPONINI" in the last 168 hours. BNP (last 3 results) No results for input(s): "PROBNP" in the last 8760 hours. HbA1C: No results for input(s): "HGBA1C" in the last 72 hours. CBG: Recent Labs  Lab 09/03/22 0953 09/03/22 2001 09/05/22 0758 09/05/22 1118  GLUCAP 339* 320* 274* 223*   Lipid Profile: No results for input(s): "CHOL", "HDL", "LDLCALC", "TRIG", "CHOLHDL", "LDLDIRECT" in the last 72 hours. Thyroid Function Tests: No results for input(s): "TSH", "T4TOTAL", "FREET4", "T3FREE", "THYROIDAB" in the last 72 hours. Anemia Panel: No results for input(s): "VITAMINB12", "FOLATE", "FERRITIN", "TIBC", "IRON", "RETICCTPCT" in the last 72 hours. Urine analysis:    Component Value Date/Time   COLORURINE YELLOW 09/05/2022 0530   APPEARANCEUR CLEAR 09/05/2022 0530   LABSPEC 1.025 09/05/2022 0530   PHURINE 6.0 09/05/2022 0530   GLUCOSEU >=500 (A) 09/05/2022 0530   HGBUR NEGATIVE 09/05/2022 0530   BILIRUBINUR NEGATIVE 09/05/2022 0530   KETONESUR 20 (A) 09/05/2022 0530   PROTEINUR 30 (A) 09/05/2022 0530   UROBILINOGEN 0.2 06/26/2015 1300   NITRITE NEGATIVE 09/05/2022 0530   LEUKOCYTESUR NEGATIVE 09/05/2022 0530    Radiological Exams on Admission: CT ABDOMEN PELVIS W CONTRAST  Result Date: 09/03/2022 CLINICAL DATA:  Acute abdominal pain with nausea and vomiting. EXAM: CT ABDOMEN AND PELVIS WITH CONTRAST  TECHNIQUE: Multidetector CT imaging of the abdomen and pelvis was performed using the standard protocol following bolus administration of intravenous contrast. RADIATION DOSE REDUCTION: This exam was performed according to the departmental dose-optimization program which includes automated exposure control, adjustment of the mA and/or kV according to patient size and/or use of iterative reconstruction technique. CONTRAST:  134m OMNIPAQUE IOHEXOL 300 MG/ML  SOLN COMPARISON:  CT abdomen and pelvis 08/15/2022 FINDINGS: Lower chest: No acute abnormality. Hepatobiliary: No focal liver abnormality is seen. Status post cholecystectomy. No biliary dilatation. Pancreas: Unremarkable. No pancreatic ductal dilatation or surrounding inflammatory changes. Spleen: Normal in size without focal abnormality. Adrenals/Urinary Tract: Left adrenal nodularity is unchanged. Kidneys and bladder are within normal limits. Stomach/Bowel: Stomach is within normal limits. Appendix appears normal. No evidence of bowel wall thickening, distention, or inflammatory changes. Vascular/Lymphatic: No significant vascular findings are present. No enlarged abdominal or pelvic lymph nodes. Reproductive: Uterus and bilateral adnexa are unremarkable. Other: There is a small fat containing umbilical hernia. There is no ascites. Musculoskeletal: There are postsurgical changes at L4-L5 and L5-S1. Degenerative changes affect the spine. IMPRESSION: 1. No acute localizing process in the abdomen or pelvis. 2. Small fat containing umbilical hernia. Electronically Signed   By: ARonney AstersM.D.   On: 09/03/2022 18:56     Assessment/Plan Principal Problem:   Intractable nausea and vomiting Active Problems:   Essential hypertension   Diabetes mellitus type 2 in obese (HCC)   Marijuana abuse   Anxiety   HLD (hyperlipidemia)   Intractable nausea/vomiting/abdominal pain: Presented with similar complaint on 1/23 and was discharged after symptomatic  management from ED.  Continues to complain of intractable nausea and vomiting.  Unable to tolerate anything by mouth. Her symptoms could be secondary to acute on chronic pancreatitis versus marijuana hyperemesis syndrome versus diabetic gastroparesis. Continue IV fluids, antiemetics preferably with Reglan.  IV pain medications only for severe pain.  Started on clear liquid diet.  Acute on chronic pancreatitis: History of chronic pancreatitis, on Creon.  Mild elevated lipase.  Complains of abdominal pain.  Recent abdominal CT on 1/23 did not show any acute findings  Type 2 diabetes: Takes insulin at home.  Continue to monitor blood sugars.  Continue current insulin regimen.  Will check hemoglobin A1c level  Hypertension: Remains hypertensive in the emergency department.  Home medications restarted.  Monitor blood pressure  Anxiety: On Xanax  Hyperlipidemia: On statin at home.  Smoker: Smokes 1.2 packs a day,counselled for cessation.Nicotene patch  Marijuana abuse: Counseled for cessation.  Checking UDS       Severity of Illness: The appropriate patient status for this patient is OBSERVATION.   DVT prophylaxis: Lovenox Code Status: Full Family Communication: None at bedside Consults called: None     AShelly CossMD [facility]  09/05/2022, 11:46 AM

## 2022-09-06 ENCOUNTER — Other Ambulatory Visit (HOSPITAL_COMMUNITY): Payer: Self-pay

## 2022-09-06 DIAGNOSIS — Z90710 Acquired absence of both cervix and uterus: Secondary | ICD-10-CM | POA: Diagnosis not present

## 2022-09-06 DIAGNOSIS — Z8249 Family history of ischemic heart disease and other diseases of the circulatory system: Secondary | ICD-10-CM | POA: Diagnosis not present

## 2022-09-06 DIAGNOSIS — R112 Nausea with vomiting, unspecified: Secondary | ICD-10-CM | POA: Diagnosis not present

## 2022-09-06 DIAGNOSIS — E785 Hyperlipidemia, unspecified: Secondary | ICD-10-CM | POA: Diagnosis present

## 2022-09-06 DIAGNOSIS — G894 Chronic pain syndrome: Secondary | ICD-10-CM | POA: Diagnosis present

## 2022-09-06 DIAGNOSIS — Z794 Long term (current) use of insulin: Secondary | ICD-10-CM | POA: Diagnosis not present

## 2022-09-06 DIAGNOSIS — Z833 Family history of diabetes mellitus: Secondary | ICD-10-CM | POA: Diagnosis not present

## 2022-09-06 DIAGNOSIS — K760 Fatty (change of) liver, not elsewhere classified: Secondary | ICD-10-CM | POA: Diagnosis present

## 2022-09-06 DIAGNOSIS — K8681 Exocrine pancreatic insufficiency: Secondary | ICD-10-CM | POA: Diagnosis present

## 2022-09-06 DIAGNOSIS — K859 Acute pancreatitis without necrosis or infection, unspecified: Secondary | ICD-10-CM | POA: Diagnosis present

## 2022-09-06 DIAGNOSIS — I10 Essential (primary) hypertension: Secondary | ICD-10-CM | POA: Diagnosis present

## 2022-09-06 DIAGNOSIS — K861 Other chronic pancreatitis: Secondary | ICD-10-CM | POA: Diagnosis present

## 2022-09-06 DIAGNOSIS — K219 Gastro-esophageal reflux disease without esophagitis: Secondary | ICD-10-CM | POA: Diagnosis present

## 2022-09-06 DIAGNOSIS — F1210 Cannabis abuse, uncomplicated: Secondary | ICD-10-CM | POA: Diagnosis present

## 2022-09-06 DIAGNOSIS — M549 Dorsalgia, unspecified: Secondary | ICD-10-CM | POA: Diagnosis present

## 2022-09-06 DIAGNOSIS — E1165 Type 2 diabetes mellitus with hyperglycemia: Secondary | ICD-10-CM | POA: Diagnosis present

## 2022-09-06 DIAGNOSIS — Z981 Arthrodesis status: Secondary | ICD-10-CM | POA: Diagnosis not present

## 2022-09-06 DIAGNOSIS — M797 Fibromyalgia: Secondary | ICD-10-CM | POA: Diagnosis present

## 2022-09-06 DIAGNOSIS — Z7982 Long term (current) use of aspirin: Secondary | ICD-10-CM | POA: Diagnosis not present

## 2022-09-06 DIAGNOSIS — F17210 Nicotine dependence, cigarettes, uncomplicated: Secondary | ICD-10-CM | POA: Diagnosis present

## 2022-09-06 DIAGNOSIS — F419 Anxiety disorder, unspecified: Secondary | ICD-10-CM | POA: Diagnosis present

## 2022-09-06 DIAGNOSIS — M069 Rheumatoid arthritis, unspecified: Secondary | ICD-10-CM | POA: Diagnosis present

## 2022-09-06 DIAGNOSIS — E669 Obesity, unspecified: Secondary | ICD-10-CM | POA: Diagnosis present

## 2022-09-06 DIAGNOSIS — Z79899 Other long term (current) drug therapy: Secondary | ICD-10-CM | POA: Diagnosis not present

## 2022-09-06 LAB — CBC
HCT: 42.1 % (ref 36.0–46.0)
Hemoglobin: 13.4 g/dL (ref 12.0–15.0)
MCH: 27.6 pg (ref 26.0–34.0)
MCHC: 31.8 g/dL (ref 30.0–36.0)
MCV: 86.8 fL (ref 80.0–100.0)
Platelets: 299 10*3/uL (ref 150–400)
RBC: 4.85 MIL/uL (ref 3.87–5.11)
RDW: 14.7 % (ref 11.5–15.5)
WBC: 10.1 10*3/uL (ref 4.0–10.5)
nRBC: 0 % (ref 0.0–0.2)

## 2022-09-06 LAB — COMPREHENSIVE METABOLIC PANEL
ALT: 16 U/L (ref 0–44)
AST: 17 U/L (ref 15–41)
Albumin: 3.7 g/dL (ref 3.5–5.0)
Alkaline Phosphatase: 116 U/L (ref 38–126)
Anion gap: 10 (ref 5–15)
BUN: 16 mg/dL (ref 6–20)
CO2: 23 mmol/L (ref 22–32)
Calcium: 8.3 mg/dL — ABNORMAL LOW (ref 8.9–10.3)
Chloride: 102 mmol/L (ref 98–111)
Creatinine, Ser: 0.75 mg/dL (ref 0.44–1.00)
GFR, Estimated: >60 mL/min (ref >60)
Glucose, Bld: 288 mg/dL — ABNORMAL HIGH (ref 70–99)
Potassium: 3.9 mmol/L (ref 3.5–5.1)
Sodium: 135 mmol/L (ref 135–145)
Total Bilirubin: 0.3 mg/dL (ref 0.3–1.2)
Total Protein: 6.7 g/dL (ref 6.5–8.1)

## 2022-09-06 LAB — GLUCOSE, CAPILLARY
Glucose-Capillary: 245 mg/dL — ABNORMAL HIGH (ref 70–99)
Glucose-Capillary: 215 mg/dL — ABNORMAL HIGH (ref 70–99)
Glucose-Capillary: 156 mg/dL — ABNORMAL HIGH (ref 70–99)
Glucose-Capillary: 321 mg/dL — ABNORMAL HIGH (ref 70–99)

## 2022-09-06 MED ORDER — INSULIN GLARGINE-YFGN 100 UNIT/ML ~~LOC~~ SOLN
30.0000 [IU] | Freq: Every day | SUBCUTANEOUS | Status: DC
  Administered 2022-09-06 – 2022-09-08 (×3): 30 [IU] via SUBCUTANEOUS
  Filled 2022-09-06 (×3): qty 0.3

## 2022-09-06 NOTE — Discharge Instructions (Addendum)
[name], After our Diabetes conversation -I contacted Dr Avbuere's office and left a message for them to contact you with additional information regarding Endocrinology referral.  -Looks like the initial referral was made to [name] endocrinology- so I recommend starting there with making an appointment.   [name] [name] [phone]) Dr. Philemon Kingdom Dr. Janie Morning [facility] [phone]) Dr. Delrae Rend [facility] [phone]) Dr. Jacelyn Pi Dr. Anda Kraft [name] [phone]) Dr. Daneil Dolin Endocrinology [phone]) [name]]  [phone]) [name]] Dr. Lavone Orn Dr. Mee Hives [name]) [phone]) Autumn Barbaraann Barthel [name], PA Dr. Amalia Greenhouse Dr. Marsh Dolly. [facility] [phone]) Dr. Glade Lloyd [facility] [phone]) Dr. Orville Govern Dr. Lelon Huh Dr. Lavone Nian, FNP Dr. Carolynn Serve. [place] in [place] [phone])   Additional items we discussed: - Small frequent meals -Incorporate protein such as protein yogurt, cheese, humus, etc - Being mindful of carbohydrates with meals (2-3 15 gram options) - Eliminate sugary beverages -Take insulin as ordered -Make appointment with endocrinology & Primary care -Call if consistently above >200's mg/dL

## 2022-09-06 NOTE — Inpatient Diabetes Management (Addendum)
Inpatient Diabetes Program Recommendations  AACE/ADA: New Consensus Statement on Inpatient Glycemic Control (2015)  Target Ranges:  Prepandial:   less than 140 mg/dL      Peak postprandial:   less than 180 mg/dL (1-2 hours)      Critically ill patients:  140 - 180 mg/dL   Lab Results  Component Value Date   GLUCAP 215 (H) 09/06/2022   HGBA1C 10.3 (H) 09/05/2022    Review of Glycemic Control  Latest Reference Range & Units 09/05/22 21:12 09/06/22 07:31 09/06/22 11:55  Glucose-Capillary 70 - 99 mg/dL 215 (H) 245 (H) 215 (H)  (H): Data is abnormally high Diabetes history: Type 2 DM Outpatient Diabetes medications: Humalog 75/25 units 50 units BID Current orders for Inpatient glycemic control: Novolog 0-15 units TID & HS, Semglee 30 units QD  Inpatient Diabetes Program Recommendations:    Spoke with patient at length regarding outpatient diabetes management. Patient became tearful with frustration over diabetes. Verifies home medications and denies missing doses.  Reviewed patient's current A1c of 10.3%. Explained what a A1c is and what it measures. Also reviewed goal A1c with patient, importance of good glucose control @ home, and blood sugar goals. Reviewed patho of DM, need for improved control, survival skills, interventions, vascular changes, gastroparesis, 75/25, and other commorbidities.  Patient has a meter and testing supplies. Blood sugars usually range from 200-400 mg/dL. Asked PCP for endocrinology referral, however, missed the appointment due to being in the hospital.  Reviewed at length when to reach out to PCP.  Called patient's PCP office to assist in referring patient again, since she cannot remember who appointment was with. Also, attached endo list to DC summary.  Counseled on small frequent meals, importance of protein, carb counting and importance of being mindful of CHO. Admits to drinking sugary beverages. Dietitian consult and outpatient education referral placed.   Question if another insulin regimen would give better control?  At discharge, could consider switching: -Tresiba insulin pen $0  #[id number] -Humalog TID insulin pen  $0  #[id number] Secure chat sent to MD.   Thanks, Bronson Curb, MSN, RNC-OB Diabetes Coordinator [phone] (8a-5p)

## 2022-09-06 NOTE — TOC Initial Note (Signed)
Transition of Care [facility]) - Initial/Assessment Note    Patient Details  Name: [name] MRN: [id number] Date of Birth: 1966-03-03  Transition of Care [facility]) CM/SW Contact:    Angelita Ingles, RN Phone Number:[phone]  09/06/2022, 8:46 AM  Clinical Narrative:                  Transition of Care [facility]) Screening Note   Patient Details  Name: [name] Date of Birth: May 16, 1966   Transition of Care [facility]) CM/SW Contact:    Angelita Ingles, RN Phone Number: 09/06/2022, 8:46 AM    Transition of Care Department [facility]) has reviewed patient and no TOC needs have been identified at this time. We will continue to monitor patient advancement through interdisciplinary progression rounds. If new patient transition needs arise, please place a TOC consult.          Patient Goals and CMS Choice            Expected Discharge Plan and Services                                              Prior Living Arrangements/Services                       Activities of Daily Living Home Assistive Devices/Equipment: None ADL Screening (condition at time of admission) Patient's cognitive ability adequate to safely complete daily activities?: Yes Is the patient deaf or have difficulty hearing?: No Does the patient have difficulty seeing, even when wearing glasses/contacts?: No Does the patient have difficulty concentrating, remembering, or making decisions?: No Patient able to express need for assistance with ADLs?: Yes Does the patient have difficulty dressing or bathing?: No Independently performs ADLs?: Yes (appropriate for developmental age) Does the patient have difficulty walking or climbing stairs?: No Weakness of Legs: None Weakness of Arms/Hands: None  Permission Sought/Granted                  Emotional Assessment              Admission diagnosis:  Intractable nausea and vomiting [R11.2] Acute on chronic pancreatitis ([place])  [K85.90, K86.1] Hyperglycemia due to diabetes mellitus ([place]) [E11.65] Patient Active Problem List   Diagnosis Date Noted   GERD (gastroesophageal reflux disease) 08/14/2022   HLD (hyperlipidemia) 08/14/2022   Adrenal adenoma, left 09/13/2021   Intractable nausea and vomiting 09/12/2021   Anxiety 09/12/2021   Gastroenteritis 09/09/2021   Diabetes mellitus type 2 in obese ([place]) 09/09/2021   Class 2 obesity due to excess calories with body mass index (BMI) of 36.0 to 36.9 in adult 09/09/2021   Marijuana abuse 09/09/2021   Tobacco dependence 09/09/2021   Essential hypertension 05/29/2020   Abdominal pain 11/23/2019   Acute pancreatitis 11/21/2019   De Quervain's syndrome (tenosynovitis) 07/06/2018   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   Complete tear of right rotator cuff 07/22/2017    Class: Chronic   Retained orthopedic hardware 07/22/2017    Class: Chronic   Cervical disc herniation 07/21/2017    Class: Acute   Status post cervical spinal fusion 07/21/17 07/21/2017   Chronic back pain    PCP:  Nolene Ebbs, MD Pharmacy:   [facility], [place] - [place] [address] [place] [address] Phone: [phone] Fax: [phone]  [facility] [phone] - [place], [address] [facility] [address] [place] [address] [facility] [address] Phone: [phone] Fax: [phone]  [place], [place] [address] [place] [address] Phone: [phone] Fax: [phone]     Social Determinants of Health (SDOH) Social History: SDOH Screenings   Food Insecurity: No Food Insecurity (09/05/2022)  Housing: Low Risk  (09/05/2022)  Transportation Needs: No Transportation Needs (09/05/2022)  Utilities: Not At Risk (09/05/2022)  Tobacco Use: High Risk (09/05/2022)   SDOH Interventions:     Readmission Risk Interventions    09/14/2021    10:52 AM  Readmission Risk Prevention Plan  Transportation Screening Complete  PCP or Specialist Appt within 3-5 Days Complete  HRI or Home Care Consult Complete  Palliative Care Screening Complete  Medication Review (RN Care Manager) Complete

## 2022-09-06 NOTE — Progress Notes (Signed)
PROGRESS NOTE  [name]  QMG:[id number] DOB: 1965-10-22 DOA: 09/04/2022 PCP: Nolene Ebbs, MD   Brief Narrative: Patient  is a 57 y.o. female with medical history significant of type 2 diabetes, hypertension, marijuana use, chronic pain syndrome, chronic pancreatitis who presented to the emergency department with abdominal pain, nausea or vomiting.   Numerous admissions in the past for the same.  History of marijuana abuse.  Admitted for the management of intractable nausea and vomiting, poor oral intake.  Assessment & Plan:  Principal Problem:   Intractable nausea and vomiting Active Problems:   Essential hypertension   Diabetes mellitus type 2 in obese (HCC)   Marijuana abuse   Anxiety   HLD (hyperlipidemia)  Intractable nausea/vomiting/abdominal pain: Presented with similar complaint on 1/23 and was discharged after symptomatic management from ED. Came back with complain of intractable nausea and vomiting.  Unable to tolerate anything by mouth. Her symptoms could be secondary to acute on chronic pancreatitis versus marijuana hyperemesis syndrome versus diabetic gastroparesis. Continue IV fluids, antiemetics preferably with Reglan.  IV pain medications only for severe pain.  Started on clear liquid diet. Her abdomen pain, nausea and vomiting are better today.  She wants to start on solid diet.   Acute on chronic pancreatitis: History of chronic pancreatitis, on Creon.  Mild elevated lipase.  Complains of abdominal pain.  Recent abdominal CT on 1/23 did not show any acute findings   Type 2 diabetes: Takes insulin at home.  Continue to monitor blood sugars.  Continue current insulin regimen.  A1c of 10.3.  Diabetic coordinator 9 consulted  Hypertension:  Home medications restarted.  Monitor blood pressure,stable   Anxiety: On Xanax   Hyperlipidemia: On statin at home.   Smoker: Smokes 1/2 packs a day,counselled for cessation.Nicotene patch ordered   Marijuana abuse:  Counseled for cessation.   UDS positive for opiates, benzodiazepines, marijuana         DVT prophylaxis:enoxaparin (LOVENOX) injection 40 mg Start: 09/05/22 1500     Code Status: Full Code  Family Communication: None at bedside  Patient status:Inpatient  Patient is from :Home  Anticipated discharge TO:[facility]  Estimated DC date:tomorrow   Consultants: None  Procedures:None  Antimicrobials:  Anti-infectives (From admission, onward)    None       Subjective: Patient seen and examined at bedside today.  She feels better.  She was sitting on the chair.  Her nausea and vomiting and abdomen pain better today but she does not feel ready to go home.  She wants to try solid food today.  Objective: Vitals:   09/05/22 2333 09/06/22 0125 09/06/22 0300 09/06/22 0540  BP: 108/71  129/70 (!) 140/75  Pulse: 86  91 81  Resp: '16 16 18 18  '$ Temp: 97.8 F (36.6 C)  97.7 F (36.5 C) 97.8 F (36.6 C)  TempSrc: Oral  Oral Oral  SpO2: 91%  92% 99%    Intake/Output Summary (Last 24 hours) at 09/06/2022 1050 Last data filed at 09/05/2022 1110 Gross per 24 hour  Intake 1000 ml  Output --  Net 1000 ml   There were no vitals filed for this visit.  Examination:  General exam: Overall comfortable, not in distress,obese HEENT: PERRL Respiratory system:  no wheezes or crackles  Cardiovascular system: S1 & S2 heard, RRR.  Gastrointestinal system: Abdomen is nondistended, soft and nontender. Central nervous system: Alert and oriented Extremities: No edema, no clubbing ,no cyanosis Skin: No rashes, no ulcers,no icterus     Data  Reviewed: I have personally reviewed following labs and imaging studies  CBC: Recent Labs  Lab 09/03/22 1042 09/04/22 0010 09/06/22 0422  WBC 8.4 9.6 10.1  NEUTROABS 6.7 7.4  --   HGB 13.7 15.0 13.4  HCT 42.8 45.1 42.1  MCV 84.1 82.0 86.8  PLT 285 388 594   Basic Metabolic Panel: Recent Labs  Lab 09/03/22 1042 09/04/22 0010 09/06/22 0422  NA  132* 134* 135  K 5.3* 3.5 3.9  CL 99 97* 102  CO2 '26 24 23  '$ GLUCOSE 323* 313* 288*  BUN '10 12 16  '$ CREATININE 0.50 0.72 0.75  CALCIUM 8.4* 9.2 8.3*     No results found for this or any previous visit (from the past 240 hour(s)).   Radiology Studies: No results found.  Scheduled Meds:  amLODipine  5 mg Oral Daily   And   irbesartan  300 mg Oral Daily   aspirin EC  81 mg Oral Daily   dicyclomine  10 mg Oral TID AC   enoxaparin (LOVENOX) injection  40 mg Subcutaneous Daily   escitalopram  20 mg Oral Daily   insulin aspart  0-15 Units Subcutaneous TID WC   insulin aspart  0-5 Units Subcutaneous QHS   insulin glargine-yfgn  30 Units Subcutaneous Daily   lipase/protease/amylase  36,000 Units Oral Daily   metoCLOPramide (REGLAN) injection  10 mg Intravenous Q6H   metoprolol succinate  50 mg Oral Daily   nicotine  14 mg Transdermal Daily   simvastatin  20 mg Oral Daily   Continuous Infusions:  sodium chloride 100 mL/hr at 09/06/22 0134     LOS: 0 days   Shelly Coss, MD [facility] P1/26/2024, 10:50 AM

## 2022-09-07 DIAGNOSIS — R112 Nausea with vomiting, unspecified: Secondary | ICD-10-CM | POA: Diagnosis not present

## 2022-09-07 LAB — GLUCOSE, CAPILLARY
Glucose-Capillary: 173 mg/dL — ABNORMAL HIGH (ref 70–99)
Glucose-Capillary: 244 mg/dL — ABNORMAL HIGH (ref 70–99)
Glucose-Capillary: 217 mg/dL — ABNORMAL HIGH (ref 70–99)
Glucose-Capillary: 235 mg/dL — ABNORMAL HIGH (ref 70–99)

## 2022-09-07 MED ORDER — LIVING WELL WITH DIABETES BOOK
Freq: Once | Status: AC
  Filled 2022-09-07: qty 1

## 2022-09-07 MED ORDER — POLYETHYLENE GLYCOL 3350 17 G PO PACK
17.0000 g | PACK | Freq: Every day | ORAL | Status: DC
  Administered 2022-09-07: 17 g via ORAL
  Filled 2022-09-07 (×2): qty 1

## 2022-09-07 MED ORDER — HYDROMORPHONE HCL 1 MG/ML IJ SOLN
1.0000 mg | INTRAMUSCULAR | Status: DC | PRN
  Administered 2022-09-07 (×3): 1 mg via INTRAVENOUS
  Filled 2022-09-07 (×3): qty 1

## 2022-09-07 MED ORDER — ENSURE MAX PROTEIN PO LIQD
11.0000 [oz_av] | Freq: Two times a day (BID) | ORAL | Status: DC
  Administered 2022-09-07 – 2022-09-08 (×2): 11 [oz_av] via ORAL

## 2022-09-07 MED ORDER — ADULT MULTIVITAMIN W/MINERALS CH
1.0000 | ORAL_TABLET | Freq: Every day | ORAL | Status: DC
  Administered 2022-09-08: 1 via ORAL
  Filled 2022-09-07: qty 1

## 2022-09-07 MED ORDER — SODIUM CHLORIDE 0.9 % IV SOLN
12.5000 mg | Freq: Four times a day (QID) | INTRAVENOUS | Status: DC | PRN
  Administered 2022-09-07: 12.5 mg via INTRAVENOUS
  Filled 2022-09-07: qty 12.5

## 2022-09-07 NOTE — Progress Notes (Signed)
PROGRESS NOTE  [name]  OHY:[id number] DOB: [birth year] DOA: 09/04/2022 PCP: Nolene Ebbs, MD   Brief Narrative: Patient  is a 57 y.o. female with medical history significant of type 2 diabetes, hypertension, marijuana use, chronic pain syndrome, chronic pancreatitis who presented to the emergency department with abdominal pain, nausea or vomiting.   Numerous admissions in the past for the same.  History of marijuana abuse.  Admitted for the management of intractable nausea and vomiting, poor oral intake.  Could not discharge today because she again developed epigastric abdominal pain and nausea.  Assessment & Plan:  Principal Problem:   Intractable nausea and vomiting Active Problems:   Essential hypertension   Diabetes mellitus type 2 in obese (HCC)   Marijuana abuse   Anxiety   HLD (hyperlipidemia)  Intractable nausea/vomiting/abdominal pain: Presented with similar complaint on 1/23 and was discharged after symptomatic management from ED. Came back with complain of intractable nausea and vomiting.  Unable to tolerate anything by mouth. Her symptoms could be secondary to acute on chronic pancreatitis versus marijuana hyperemesis syndrome versus diabetic gastroparesis. Continue gentle IV fluids, antiemetics preferably with Reglan,added phenergan.  IV pain medications only for severe pain.   Her abdomen pain, nausea came back today.  She wants to stay on solid food   Acute on chronic pancreatitis: History of chronic pancreatitis, on Creon.  Mild elevated lipase.  Complains of abdominal pain.  Recent abdominal CT on 1/23 did not show any acute findings   Type 2 diabetes: Takes insulin at home.  Continue to monitor blood sugars.  Continue current insulin regimen.  A1c of 10.3.  Diabetic coordinator consulted, recommended to change the insulin to Tresiba 30 units daily, Humalog 0 to 9 units sliding scale with meals  Hypertension:  Home medications restarted.  Monitor blood  pressure,stable   Anxiety: On Xanax   Hyperlipidemia: On statin at home.   Smoker: Smokes 1/2 packs a day,counselled for cessation.Nicotene patch ordered   Marijuana abuse: Counseled for cessation.   UDS positive for opiates, benzodiazepines, marijuana         DVT prophylaxis:enoxaparin (LOVENOX) injection 40 mg Start: 09/05/22 1500     Code Status: Full Code  Family Communication: None at bedside  Patient status:Inpatient  Patient is from :Home  Anticipated discharge RS:[facility]  Estimated DC date:tomorrow   Consultants: None  Procedures:None  Antimicrobials:  Anti-infectives (From admission, onward)    None       Subjective: Patient seen and examined at bedside today but hemodynamically stable.  Very emotional today, crying because she again developed abdominal pain and and  nausea.  Long discussion held at the bedside today.  Her abdominal pain, nausea could be from diabetic gastroparesis but also can be from marijuana causing hyperemesis syndrome.  We decided to not to discharge her  Objective: Vitals:   09/06/22 0540 09/06/22 1348 09/06/22 2116 09/07/22 0507  BP: (!) 140/75 (!) 140/71 128/80 131/63  Pulse: 81 84 77 72  Resp: '18 16 18 19  '$ Temp: 97.8 F (36.6 C) 98.1 F (36.7 C) 98.2 F (36.8 C) 98.1 F (36.7 C)  TempSrc: Oral Oral Oral   SpO2: 99% 96% 97% 98%    Intake/Output Summary (Last 24 hours) at 09/07/2022 1034 Last data filed at 09/07/2022 1000 Gross per 24 hour  Intake 3650.16 ml  Output --  Net 3650.16 ml   There were no vitals filed for this visit.  Examination:  General exam: emotional,obese, not in distress HEENT: PERRL Respiratory system:  no wheezes or crackles  Cardiovascular system: S1 & S2 heard, RRR.  Gastrointestinal system: Abdomen is nondistended, soft .  Tender in the epigastric area. Central nervous system: Alert and oriented Extremities: No edema, no clubbing ,no cyanosis Skin: No rashes, no ulcers,no icterus      Data Reviewed: I have personally reviewed following labs and imaging studies  CBC: Recent Labs  Lab 09/03/22 1042 09/04/22 0010 09/06/22 0422  WBC 8.4 9.6 10.1  NEUTROABS 6.7 7.4  --   HGB 13.7 15.0 13.4  HCT 42.8 45.1 42.1  MCV 84.1 82.0 86.8  PLT 285 388 762   Basic Metabolic Panel: Recent Labs  Lab 09/03/22 1042 09/04/22 0010 09/06/22 0422  NA 132* 134* 135  K 5.3* 3.5 3.9  CL 99 97* 102  CO2 '26 24 23  '$ GLUCOSE 323* 313* 288*  BUN '10 12 16  '$ CREATININE 0.50 0.72 0.75  CALCIUM 8.4* 9.2 8.3*     No results found for this or any previous visit (from the past 240 hour(s)).   Radiology Studies: No results found.  Scheduled Meds:  amLODipine  5 mg Oral Daily   And   irbesartan  300 mg Oral Daily   aspirin EC  81 mg Oral Daily   dicyclomine  10 mg Oral TID AC   enoxaparin (LOVENOX) injection  40 mg Subcutaneous Daily   escitalopram  20 mg Oral Daily   insulin aspart  0-15 Units Subcutaneous TID WC   insulin aspart  0-5 Units Subcutaneous QHS   insulin glargine-yfgn  30 Units Subcutaneous Daily   lipase/protease/amylase  36,000 Units Oral Daily   living well with diabetes book   Does not apply Once   metoCLOPramide (REGLAN) injection  10 mg Intravenous Q6H   metoprolol succinate  50 mg Oral Daily   nicotine  14 mg Transdermal Daily   polyethylene glycol  17 g Oral Daily   simvastatin  20 mg Oral Daily   Continuous Infusions:  sodium chloride 100 mL/hr at 09/07/22 0848   promethazine (PHENERGAN) injection (IM or IVPB)       LOS: 1 day   Shelly Coss, MD [facility] P1/27/2024, 10:34 AM

## 2022-09-07 NOTE — Progress Notes (Signed)
Initial Nutrition Assessment  DOCUMENTATION CODES:   Obesity unspecified  INTERVENTION:   Ensure Max protein supplement BID, each supplement provides 150kcal and 30g of protein.  MVI po daily   Pt at high refeed risk; recommend monitor potassium, magnesium and phosphorus labs daily until stable  Pancreatitis and diabetes diet education   Referral sent to [facility] for outpatient diet education   NUTRITION DIAGNOSIS:   Inadequate oral intake related to acute illness as evidenced by per patient/family report.  GOAL:   Patient will meet greater than or equal to 90% of their needs  MONITOR:   PO intake, Supplement acceptance, Labs, Weight trends, Skin, I & O's  REASON FOR ASSESSMENT:   Consult Diet education  ASSESSMENT:   57 y.o. female with medical history significant of type 2 diabetes, hypertension, marijuana use, chronic pain syndrome, HTN, anxiety, GERD and chronic pancreatitis on creon who presented to the emergency department with abdominal pain, nausea or vomiting.  RD working remotely.  Spoke with pt via phone. Pt reports that she is feeling a little better today. Pt reports poor appetite and oral intake for 2-3 days pta. Pt reports that she has continued to have nausea and abdominal pain in hospital. Pt reports eating a salad for lunch today that she has "kept down". RD discussed with pt the importance of adequate nutrition needed to preserve lean muscle. Pt is willing to drink Ensure in hospital. RD will add supplements and MVI. Pt is at high refeed risk. RD provided pt with pancreatitis and diabetic diet education today. Pt would like to follow up with an outpatient RD for continued education; referral sent. RD will obtain NFPE at follow up.   RD provided "Nutrition and Type II Diabetes" handout from the Academy of [employer]. Discussed different food groups and their effects on blood sugar, emphasizing carbohydrate-containing foods. Provided list of  carbohydrates and recommended serving sizes of common foods.  Discussed importance of controlled and consistent carbohydrate intake throughout the day. Provided examples of ways to balance meals/snacks and encouraged intake of high-fiber, whole grain complex carbohydrates.   RD discussed pancreatitis diet recommendations with pt. Advised patient to eat multiple small meals and to avoid high fat foods, spicy foods and alcohol. Teach back method used.  Medications reviewed and include: aspirin, bentyl, lovenox, insulin, creon, reglan, nicotine, miralax, NaCl '@100ml'$ /hr  Labs reviewed: K 3.9 wnl Lipase- 58(H)- 1/24 Cbgs- 244, 173 x 24 hrs  AIC 10.3(H)- 1/25  NUTRITION - FOCUSED PHYSICAL EXAM: Unable to perform at this time   Diet Order:   Diet Order             Diet Carb Modified Fluid consistency: Thin; Room service appropriate? Yes  Diet effective now                  EDUCATION NEEDS:   Education needs have been addressed  Skin:  Skin Assessment: Reviewed RN Assessment  Last BM:  1/25  Height:   Ht Readings from Last 1 Encounters:  08/14/22 '5\' 4"'$  (1.626 m)    Weight:   Wt Readings from Last 1 Encounters:  08/14/22 96.6 kg    Ideal Body Weight:  54.5 kg  Estimated Nutritional Needs:   Kcal:  1900-2200kcal/day  Protein:  95-110g/day  Fluid:  1.7-1.9L/day  Koleen Distance MS, RD, LDN Please refer to [facility] for RD and/or RD on-call/weekend/after hours pager

## 2022-09-07 NOTE — Inpatient Diabetes Management (Addendum)
Inpatient Diabetes Program Recommendations  AACE/ADA: New Consensus Statement on Inpatient Glycemic Control (2015)  Target Ranges:  Prepandial:   less than 140 mg/dL      Peak postprandial:   less than 180 mg/dL (1-2 hours)      Critically ill patients:  140 - 180 mg/dL   Lab Results  Component Value Date   GLUCAP 173 (H) 09/07/2022   HGBA1C 10.3 (H) 09/05/2022    Review of Glycemic Control  Diabetes history: type 2 Outpatient Diabetes medications: Humalog 75/25 mixed insulin 50 units BID Current orders for Inpatient glycemic control: Semglee 30 units daily, Novolog 0-15 units TID & HS scale  Inpatient Diabetes Program Recommendations:   Spoke with patient on the phone. Asked patient about taking [place] and Humalog 3 times/per da at home. Diabetes coordinator spoke with patient yesterday and patient stated that she wanted to make some changes in her diabetes medications. States that she has a video visit with her PCP on Monday. Patient stated that she would be [facility] with a [facility] referral.   Patient states today that she would like to try [place] once a day.  Patient states that she does not eat 3 full meals per day, seems that large meals tend to upset her stomach.   Recommend: Tresiba 30 units daily Humalog 0-9 units sliding scale with meals (doesn't take if not eating)   Patient seems to prefer a sliding scale instead of a set TID dosage of Humalog.   Harvel Ricks RN BSN CDE Diabetes Coordinator Pager: [phone]  8am-5pm

## 2022-09-08 DIAGNOSIS — R112 Nausea with vomiting, unspecified: Secondary | ICD-10-CM | POA: Diagnosis not present

## 2022-09-08 LAB — GLUCOSE, CAPILLARY
Glucose-Capillary: 170 mg/dL — ABNORMAL HIGH (ref 70–99)
Glucose-Capillary: 177 mg/dL — ABNORMAL HIGH (ref 70–99)

## 2022-09-08 MED ORDER — INSULIN ASPART 100 UNIT/ML IJ SOLN
0.0000 [IU] | Freq: Three times a day (TID) | INTRAMUSCULAR | Status: DC
  Administered 2022-09-08: 2 [IU] via SUBCUTANEOUS

## 2022-09-08 MED ORDER — METOCLOPRAMIDE HCL 10 MG PO TABS: 10.0000 mg | ORAL_TABLET | Freq: Three times a day (TID) | ORAL | 0 refills | Status: DC | PRN

## 2022-09-08 MED ORDER — NICOTINE 14 MG/24HR TD PT24: 14.0000 mg | MEDICATED_PATCH | Freq: Every day | TRANSDERMAL | 0 refills | Status: DC

## 2022-09-08 MED ORDER — INSULIN PEN NEEDLE 32G X 4 MM MISC: 1.0000 | Freq: Three times a day (TID) | 1 refills | Status: AC

## 2022-09-08 MED ORDER — INSULIN ASPART 100 UNIT/ML FLEXPEN: 0.0000 [IU] | PEN_INJECTOR | Freq: Three times a day (TID) | SUBCUTANEOUS | 1 refills | Status: DC

## 2022-09-08 MED ORDER — POLYETHYLENE GLYCOL 3350 17 G PO PACK: 17.0000 g | PACK | Freq: Every day | ORAL | 0 refills | Status: DC | PRN

## 2022-09-08 MED ORDER — INSULIN DEGLUDEC 100 UNIT/ML ~~LOC~~ SOPN: 30.0000 [IU] | PEN_INJECTOR | Freq: Every day | SUBCUTANEOUS | 0 refills | Status: DC

## 2022-09-08 NOTE — Plan of Care (Signed)

## 2022-09-08 NOTE — Progress Notes (Signed)
  Transition of Care [facility]) Screening Note   Patient Details  Name: [name] Date of Birth: Dec 28, 1965   Transition of Care [facility] CM/SW Contact:    Roseanne Kaufman, RN Phone Number: 09/08/2022, 11:30 AM    Transition of Care Department [facility]) has reviewed patient and no TOC needs have been identified at this time. We will continue to monitor patient advancement through interdisciplinary progression rounds. If new patient transition needs arise, please place a TOC consult.

## 2022-09-08 NOTE — Discharge Summary (Signed)
Physician Discharge Summary  Anquinette Pierro OZD:[id number] DOB: 06-01-1966 DOA: 09/04/2022  PCP: Nolene Ebbs, MD  Admit date: 09/04/2022 Discharge date: 09/08/2022  Admitted From: Home Disposition:  Home  Discharge Condition:Stable CODE STATUS:FULL Diet recommendation: Carb Modified   Brief/Interim Summary: [name]  is a 57 y.o. female with medical history significant of type 2 diabetes, hypertension, marijuana use, chronic pain syndrome, chronic pancreatitis who presented to the emergency department with abdominal pain, nausea or vomiting.   Numerous admissions in the past for the same.  History of marijuana abuse.  Admitted for the management of intractable nausea and vomiting, poor oral intake.  She was started on IV fluids, antiemetics including Reglan.  Abdominal pain, nausea and vomiting gradually improved.  She is very comfortable today, tolerating solid food.  Medically stable for discharge home   Following problems were addressed during the hospitalization:  Intractable nausea/vomiting/abdominal pain: Presented with similar complaint on 1/23 and was discharged after symptomatic management from ED. Came back with complain of intractable nausea and vomiting.  Unable to tolerate anything by mouth. Her symptoms could be secondary to acute on chronic pancreatitis versus marijuana hyperemesis syndrome versus diabetic gastroparesis. Treated with  gentle IV fluids, antiemetics preferably with Reglan, Abdominal pain, nausea and vomiting resolved.  Acute on chronic pancreatitis: History of chronic pancreatitis, on Creon.  Mild elevated lipase.   Recent abdominal CT on 1/23 did not show any acute findings   Type 2 diabetes: Takes insulin at home.  Continue to monitor blood sugars.  Continue current insulin regimen.  A1c of 10.3.  Diabetic coordinator consulted, recommended to change the insulin to Tresiba 30 units daily, Humalog 0 to 9 units sliding scale with meals   Hypertension:   Home medications restarted.     Anxiety: On Xanax   Hyperlipidemia: On statin at home.   Smoker: Smokes 1/2 packs a day,counselled for cessation.Nicotene patch ordered   Marijuana abuse: Counseled for cessation.   UDS positive for opiates, benzodiazepines, marijuana     Discharge Diagnoses:  Principal Problem:   Intractable nausea and vomiting Active Problems:   Essential hypertension   Diabetes mellitus type 2 in obese (HCC)   Marijuana abuse   Anxiety   HLD (hyperlipidemia)    Discharge Instructions  Discharge Instructions     Ambulatory referral to Nutrition and Diabetic Education   Complete by: As directed    Diet Carb Modified   Complete by: As directed    Discharge instructions   Complete by: As directed    1)Please take prescribed medications as instructed.  Monitor blood sugars at home.  Use insulin as instructed 2)Follow up with your PCP in a week. 3)Quit smoking 4)Take low volume nonfatty meals   Increase activity slowly   Complete by: As directed       Allergies as of 09/08/2022   No Known Allergies      Medication List     STOP taking these medications    azithromycin 250 MG tablet Commonly known as: ZITHROMAX   Insulin Lispro Prot & Lispro (75-25) 100 UNIT/ML Kwikpen Commonly known as: HUMALOG 75/25 MIX       TAKE these medications    acetaminophen 500 MG tablet Commonly known as: TYLENOL Take 500 mg by mouth 2 (two) times daily as needed for moderate pain.   albuterol (2.5 MG/3ML) 0.083% nebulizer solution Commonly known as: PROVENTIL Take 3 mLs (2.5 mg total) by nebulization every 6 (six) hours as needed for wheezing or shortness of breath.  albuterol 108 (90 Base) MCG/ACT inhaler Commonly known as: VENTOLIN HFA Inhale 2 puffs into the lungs every 4 (four) hours as needed for wheezing.   ALPRAZolam 1 MG tablet Commonly known as: XANAX Take 1 mg by mouth daily as needed for anxiety.   amLODipine-olmesartan 5-40 MG  tablet Commonly known as: AZOR Take 1 tablet by mouth daily.   aspirin EC 81 MG tablet Take 81 mg by mouth daily. Swallow whole.   Creon 36000 UNITS Cpep capsule Generic drug: lipase/protease/amylase Take 36,000 Units by mouth daily.   cyclobenzaprine 10 MG tablet Commonly known as: FLEXERIL TAKE 1 TABLET(10 MG) BY MOUTH TWICE DAILY AS NEEDED FOR MUSCLE SPASMS What changed: See the new instructions.   dicyclomine 10 MG capsule Commonly known as: BENTYL Take 10 mg by mouth daily.   escitalopram 20 MG tablet Commonly known as: LEXAPRO Take 20 mg by mouth daily.   fluticasone 50 MCG/ACT nasal spray Commonly known as: FLONASE Place 1 spray into both nostrils daily as needed for allergies or rhinitis.   ibuprofen 800 MG tablet Commonly known as: ADVIL Take 1 tablet (800 mg total) by mouth every 8 (eight) hours as needed (pain).   insulin aspart 100 UNIT/ML FlexPen Commonly known as: NOVOLOG Inject 0-9 Units into the skin 3 (three) times daily with meals. CBG 70 - 120: 0 units ,CBG 121 - 150:1 unit  ,CBG 151 - 200:2 units, CBG 201 - 250:3 units,CBG 251 - 300:5 units,CBG 301 - 350: 7 units ,CBG 351 - 400:9 units,CBG > 400 call MD and obtain STAT lab verification   insulin degludec 100 UNIT/ML FlexTouch Pen Commonly known as: TRESIBA Inject 30 Units into the skin daily.   Insulin Pen Needle 32G X 4 MM Misc 1 each by Does not apply route 3 (three) times daily.   metoCLOPramide 10 MG tablet Commonly known as: REGLAN Take 1 tablet (10 mg total) by mouth every 8 (eight) hours as needed for nausea. What changed:  when to take this reasons to take this   metoprolol succinate 50 MG 24 hr tablet Commonly known as: TOPROL-XL Take 50 mg by mouth daily.   nicotine 14 mg/24hr patch Commonly known as: NICODERM CQ - dosed in mg/24 hours Place 1 patch (14 mg total) onto the skin daily. Start taking on: September 09, 2022   polyethylene glycol 17 g packet Commonly known as: MIRALAX  / GLYCOLAX Take 17 g by mouth daily as needed.   simvastatin 20 MG tablet Commonly known as: ZOCOR Take 20 mg by mouth daily.   VITAMIN D PO Take 1 tablet by mouth daily.        Follow-up Information     Nolene Ebbs, MD. Schedule an appointment as soon as possible for a visit in 1 week(s).   Specialty: Internal Medicine Contact information: [place] [address] [phone]                No Known Allergies  Consultations: None   Procedures/Studies: CT ABDOMEN PELVIS W CONTRAST  Result Date: 09/03/2022 CLINICAL DATA:  Acute abdominal pain with nausea and vomiting. EXAM: CT ABDOMEN AND PELVIS WITH CONTRAST TECHNIQUE: Multidetector CT imaging of the abdomen and pelvis was performed using the standard protocol following bolus administration of intravenous contrast. RADIATION DOSE REDUCTION: This exam was performed according to the departmental dose-optimization program which includes automated exposure control, adjustment of the mA and/or kV according to patient size and/or use of iterative reconstruction technique. CONTRAST:  176m OMNIPAQUE  IOHEXOL 300 MG/ML  SOLN COMPARISON:  CT abdomen and pelvis 08/15/2022 FINDINGS: Lower chest: No acute abnormality. Hepatobiliary: No focal liver abnormality is seen. Status post cholecystectomy. No biliary dilatation. Pancreas: Unremarkable. No pancreatic ductal dilatation or surrounding inflammatory changes. Spleen: Normal in size without focal abnormality. Adrenals/Urinary Tract: Left adrenal nodularity is unchanged. Kidneys and bladder are within normal limits. Stomach/Bowel: Stomach is within normal limits. Appendix appears normal. No evidence of bowel wall thickening, distention, or inflammatory changes. Vascular/Lymphatic: No significant vascular findings are present. No enlarged abdominal or pelvic lymph nodes. Reproductive: Uterus and bilateral adnexa are unremarkable. Other: There is a small fat containing  umbilical hernia. There is no ascites. Musculoskeletal: There are postsurgical changes at L4-L5 and L5-S1. Degenerative changes affect the spine. IMPRESSION: 1. No acute localizing process in the abdomen or pelvis. 2. Small fat containing umbilical hernia. Electronically Signed   By: Ronney Asters M.D.   On: 09/03/2022 18:56   CT ABDOMEN PELVIS W CONTRAST  Result Date: 08/15/2022 CLINICAL DATA:  Intractable abdominal pain. EXAM: CT ABDOMEN AND PELVIS WITH CONTRAST TECHNIQUE: Multidetector CT imaging of the abdomen and pelvis was performed using the standard protocol following bolus administration of intravenous contrast. RADIATION DOSE REDUCTION: This exam was performed according to the departmental dose-optimization program which includes automated exposure control, adjustment of the mA and/or kV according to patient size and/or use of iterative reconstruction technique. CONTRAST:  19m OMNIPAQUE IOHEXOL 300 MG/ML  SOLN COMPARISON:  CT 06/03/2019, ultrasound yesterday. FINDINGS: Lower chest: Subsegmental atelectasis in the left greater than right lower lobe. No pneumonia. No pleural fluid. Hepatobiliary: Mild hepatic steatosis without focal liver lesion. There is small focus of superimposed fatty deposition adjacent to the falciform ligament. Clips in the gallbladder fossa postcholecystectomy. No biliary dilatation. Pancreas: Slight indistinctness of the peripancreatic fat about the pancreatic head, for example coronal series 5, image 48. No peripancreatic fluid. Homogeneous pancreatic enhancement. No ductal dilatation or pancreatic mass. Spleen: Normal in size without focal abnormality. Adrenals/Urinary Tract: Nodularity of the left adrenal gland up to 13 mm, with a punctate calcification in the lateral limb, stable over multiple prior exams dating back to at least 2015, considered benign. Normal right adrenal gland. No hydronephrosis, perinephric edema or renal calculi. No focal renal abnormality. The urinary  bladder is partially distended, normal for degree of distension. Stomach/Bowel: No bowel obstruction or inflammation. Normal appendix. Small-moderate volume of colonic stool. Vascular/Lymphatic: Normal caliber abdominal aorta, minimal atherosclerosis. Patent portal, splenic and mesenteric veins. No acute vascular findings. No enlarged lymph nodes in the abdomen pelvis. Reproductive: Status post hysterectomy. No adnexal masses. Other: No ascites. No free air or focal fluid collection. Postsurgical change of the anterior abdominal wall. Small fat containing supraumbilical ventral abdominal wall hernia. Anterior abdominal wall laxity with rectus diastasis. Musculoskeletal: Postsurgical and degenerative changes in the lower lumbar spine. Mild bilateral hip osteoarthritis. There are no acute or suspicious osseous abnormalities. IMPRESSION: 1. Slight indistinctness of the peripancreatic fat about the pancreatic head, can be seen with acute pancreatitis. 2. Mild hepatic steatosis. 3. Small fat containing supraumbilical ventral abdominal wall hernia. Aortic Atherosclerosis (ICD10-I70.0). Electronically Signed   By: MKeith RakeM.D.   On: 08/15/2022 15:16   [place]oreaAbdomen Complete  Result Date: 08/14/2022 CLINICAL DATA:  History of pancreatitis. Abdominal pain vomiting. Prior cholecystectomy. EXAM: ABDOMEN ULTRASOUND COMPLETE COMPARISON:  CT 06/02/2022. Ultrasound 10/17/2021. Multiple older exams as well FINDINGS: Gallbladder: Gallbladder is surgically absent as per clinical history. Common bile duct: Diameter: 4 Liver: Mildly echogenic  hepatic parenchyma consistent with fatty liver infiltration. No obvious mass lesion although evaluation for underlying mass lesion is limited with echogenic parenchyma. Please correlate with the prior contrast CT scan. No intrahepatic biliary ductal dilatation. Preserved flow in the main portal vein on color Doppler. IVC: No abnormality visualized. Pancreas: Visualized portion  unremarkable. Some portions are obscured by overlapping bowel gas. Spleen: Size and appearance within normal limits. Right Kidney: Length: 9.0. Echogenicity within normal limits. No mass or hydronephrosis visualized. Left Kidney: Length: 10.0. Echogenicity within normal limits. No mass or hydronephrosis visualized. Abdominal aorta: No aneurysm visualized. Other findings: None. IMPRESSION: Previous cholecystectomy.  No ductal dilatation. Some portions of the abdomen are obscured by overlapping bowel gas. Electronically Signed   By: Jill Side M.D.   On: 08/14/2022 11:35      Subjective: Patient seen and examined at bedside today.  Hemodynamically stable for discharge  Discharge Exam: Vitals:   09/07/22 1215 09/07/22 2007  BP: (!) 145/80 (!) 159/77  Pulse: 86 79  Resp: 17 17  Temp: 98 F (36.7 C) 97.6 F (36.4 C)  SpO2: 96% 94%   Vitals:   09/07/22 0507 09/07/22 1123 09/07/22 1215 09/07/22 2007  BP: 131/63 130/82 (!) 145/80 (!) 159/77  Pulse: 72 84 86 79  Resp: '19 17 17 17  '$ Temp: 98.1 F (36.7 C)  98 F (36.7 C) 97.6 F (36.4 C)  TempSrc:    Oral  SpO2: 98% 98% 96% 94%    General: Pt is alert, awake, not in acute distress Cardiovascular: RRR, S1/S2 +, no rubs, no gallops Respiratory: CTA bilaterally, no wheezing, no rhonchi Abdominal: Soft, NT, ND, bowel sounds + Extremities: no edema, no cyanosis    The results of significant diagnostics from this hospitalization (including imaging, microbiology, ancillary and laboratory) are listed below for reference.     Microbiology: No results found for this or any previous visit (from the past 240 hour(s)).   Labs: BNP (last 3 results) No results for input(s): "BNP" in the last 8760 hours. Basic Metabolic Panel: Recent Labs  Lab 09/03/22 1042 09/04/22 0010 09/06/22 0422  NA 132* 134* 135  K 5.3* 3.5 3.9  CL 99 97* 102  CO2 '26 24 23  '$ GLUCOSE 323* 313* 288*  BUN '10 12 16  '$ CREATININE 0.50 0.72 0.75  CALCIUM 8.4* 9.2  8.3*   Liver Function Tests: Recent Labs  Lab 09/03/22 1042 09/04/22 0010 09/06/22 0422  AST '28 17 17  '$ ALT '19 19 16  '$ ALKPHOS 124 108 116  BILITOT 1.3* 0.8 0.3  PROT 7.8 7.5 6.7  ALBUMIN 4.2 4.1 3.7   Recent Labs  Lab 09/03/22 1042 09/04/22 0010  LIPASE 76* 58*   No results for input(s): "AMMONIA" in the last 168 hours. CBC: Recent Labs  Lab 09/03/22 1042 09/04/22 0010 09/06/22 0422  WBC 8.4 9.6 10.1  NEUTROABS 6.7 7.4  --   HGB 13.7 15.0 13.4  HCT 42.8 45.1 42.1  MCV 84.1 82.0 86.8  PLT 285 388 299   Cardiac Enzymes: No results for input(s): "CKTOTAL", "CKMB", "CKMBINDEX", "TROPONINI" in the last 168 hours. BNP: Invalid input(s): "POCBNP" CBG: Recent Labs  Lab 09/07/22 0822 09/07/22 1140 09/07/22 1700 09/07/22 2057 09/08/22 0745  GLUCAP 173* 244* 217* 235* 170*   D-Dimer No results for input(s): "DDIMER" in the last 72 hours. Hgb A1c No results for input(s): "HGBA1C" in the last 72 hours. Lipid Profile No results for input(s): "CHOL", "HDL", "LDLCALC", "TRIG", "CHOLHDL", "LDLDIRECT" in the last 72  hours. Thyroid function studies No results for input(s): "TSH", "T4TOTAL", "T3FREE", "THYROIDAB" in the last 72 hours.  Invalid input(s): "FREET3" Anemia work up No results for input(s): "VITAMINB12", "FOLATE", "FERRITIN", "TIBC", "IRON", "RETICCTPCT" in the last 72 hours. Urinalysis    Component Value Date/Time   COLORURINE YELLOW 09/05/2022 0530   APPEARANCEUR CLEAR 09/05/2022 0530   LABSPEC 1.025 09/05/2022 0530   PHURINE 6.0 09/05/2022 0530   GLUCOSEU >=500 (A) 09/05/2022 0530   HGBUR NEGATIVE 09/05/2022 0530   BILIRUBINUR NEGATIVE 09/05/2022 0530   KETONESUR 20 (A) 09/05/2022 0530   PROTEINUR 30 (A) 09/05/2022 0530   UROBILINOGEN 0.2 06/26/2015 1300   NITRITE NEGATIVE 09/05/2022 0530   LEUKOCYTESUR NEGATIVE 09/05/2022 0530   Sepsis Labs Recent Labs  Lab 09/03/22 1042 09/04/22 0010 09/06/22 0422  WBC 8.4 9.6 10.1   Microbiology No  results found for this or any previous visit (from the past 240 hour(s)).  Please note: You were cared for by a hospitalist during your hospital stay. Once you are discharged, your primary care physician will handle any further medical issues. Please note that NO REFILLS for any discharge medications will be authorized once you are discharged, as it is imperative that you return to your primary care physician (or establish a relationship with a primary care physician if you do not have one) for your post hospital discharge needs so that they can reassess your need for medications and monitor your lab values.    Time coordinating discharge: 40 minutes  SIGNED:   Shelly Coss, MD  [facility] 09/08/2022, 10:18 AM Pager [phone]  If 7PM-7AM, please contact night-coverage [email or web address] Password [email or web address]

## 2022-09-09 ENCOUNTER — Emergency Department (HOSPITAL_COMMUNITY)
Admission: EM | Admit: 2022-09-09 | Discharge: 2022-09-09 | Disposition: A | Discharge status: Home / Self Care | Payer: 59 | Attending: Emergency Medicine | Admitting: Emergency Medicine

## 2022-09-09 ENCOUNTER — Encounter (HOSPITAL_COMMUNITY): Payer: Self-pay

## 2022-09-09 ENCOUNTER — Emergency Department (HOSPITAL_COMMUNITY): Payer: 59

## 2022-09-09 DIAGNOSIS — R112 Nausea with vomiting, unspecified: Secondary | ICD-10-CM | POA: Insufficient documentation

## 2022-09-09 DIAGNOSIS — R109 Unspecified abdominal pain: Secondary | ICD-10-CM

## 2022-09-09 DIAGNOSIS — Z7982 Long term (current) use of aspirin: Secondary | ICD-10-CM | POA: Insufficient documentation

## 2022-09-09 DIAGNOSIS — R1013 Epigastric pain: Secondary | ICD-10-CM | POA: Diagnosis present

## 2022-09-09 DIAGNOSIS — Z794 Long term (current) use of insulin: Secondary | ICD-10-CM | POA: Insufficient documentation

## 2022-09-09 LAB — COMPREHENSIVE METABOLIC PANEL
ALT: 22 U/L (ref 0–44)
AST: 24 U/L (ref 15–41)
Albumin: 4.7 g/dL (ref 3.5–5.0)
Alkaline Phosphatase: 123 U/L (ref 38–126)
Anion gap: 16 — ABNORMAL HIGH (ref 5–15)
BUN: 13 mg/dL (ref 6–20)
CO2: 18 mmol/L — ABNORMAL LOW (ref 22–32)
Calcium: 9.5 mg/dL (ref 8.9–10.3)
Chloride: 100 mmol/L (ref 98–111)
Creatinine, Ser: 0.69 mg/dL (ref 0.44–1.00)
GFR, Estimated: >60 mL/min (ref >60)
Glucose, Bld: 346 mg/dL — ABNORMAL HIGH (ref 70–99)
Potassium: 3.6 mmol/L (ref 3.5–5.1)
Sodium: 134 mmol/L — ABNORMAL LOW (ref 135–145)
Total Bilirubin: 1.1 mg/dL (ref 0.3–1.2)
Total Protein: 8.4 g/dL — ABNORMAL HIGH (ref 6.5–8.1)

## 2022-09-09 LAB — CBC WITH DIFFERENTIAL/PLATELET
Abs Immature Granulocytes: 0.06 10*3/uL (ref 0.00–0.07)
Basophils Absolute: 0 10*3/uL (ref 0.0–0.1)
Basophils Relative: 0 %
Eosinophils Absolute: 0 10*3/uL (ref 0.0–0.5)
Eosinophils Relative: 0 %
HCT: 45.7 % (ref 36.0–46.0)
Hemoglobin: 15.3 g/dL — ABNORMAL HIGH (ref 12.0–15.0)
Immature Granulocytes: 0 %
Lymphocytes Relative: 10 %
Lymphs Abs: 1.3 10*3/uL (ref 0.7–4.0)
MCH: 26.9 pg (ref 26.0–34.0)
MCHC: 33.5 g/dL (ref 30.0–36.0)
MCV: 80.5 fL (ref 80.0–100.0)
Monocytes Absolute: 0.3 10*3/uL (ref 0.1–1.0)
Monocytes Relative: 2 %
Neutro Abs: 12 10*3/uL — ABNORMAL HIGH (ref 1.7–7.7)
Neutrophils Relative %: 88 %
Platelets: 388 10*3/uL (ref 150–400)
RBC: 5.68 MIL/uL — ABNORMAL HIGH (ref 3.87–5.11)
RDW: 14.6 % (ref 11.5–15.5)
WBC: 13.7 10*3/uL — ABNORMAL HIGH (ref 4.0–10.5)
nRBC: 0 % (ref 0.0–0.2)

## 2022-09-09 LAB — I-STAT CHEM 8, ED
BUN: 11 mg/dL (ref 6–20)
Calcium, Ion: 1.07 mmol/L — ABNORMAL LOW (ref 1.15–1.40)
Chloride: 101 mmol/L (ref 98–111)
Creatinine, Ser: 0.5 mg/dL (ref 0.44–1.00)
Glucose, Bld: 357 mg/dL — ABNORMAL HIGH (ref 70–99)
HCT: 48 % — ABNORMAL HIGH (ref 36.0–46.0)
Hemoglobin: 16.3 g/dL — ABNORMAL HIGH (ref 12.0–15.0)
Potassium: 3.7 mmol/L (ref 3.5–5.1)
Sodium: 137 mmol/L (ref 135–145)
TCO2: 20 mmol/L — ABNORMAL LOW (ref 22–32)

## 2022-09-09 LAB — LIPASE, BLOOD: Lipase: 34 U/L (ref 11–51)

## 2022-09-09 MED ORDER — LACTATED RINGERS IV BOLUS: 2000.0000 mL | Freq: Once | INTRAVENOUS | Status: DC

## 2022-09-09 MED ORDER — ONDANSETRON HCL 4 MG/2ML IJ SOLN
4.0000 mg | Freq: Once | INTRAMUSCULAR | Status: AC
  Administered 2022-09-09: 4 mg via INTRAVENOUS
  Filled 2022-09-09: qty 2

## 2022-09-09 MED ORDER — HYDROMORPHONE HCL 1 MG/ML IJ SOLN
1.0000 mg | Freq: Once | INTRAMUSCULAR | Status: AC
  Administered 2022-09-09: 1 mg via INTRAVENOUS
  Filled 2022-09-09: qty 1

## 2022-09-09 MED ORDER — PANTOPRAZOLE SODIUM 40 MG IV SOLR
40.0000 mg | Freq: Once | INTRAVENOUS | Status: AC
  Administered 2022-09-09: 40 mg via INTRAVENOUS
  Filled 2022-09-09: qty 10

## 2022-09-09 MED ORDER — MIDAZOLAM HCL 2 MG/2ML IJ SOLN
2.0000 mg | Freq: Once | INTRAMUSCULAR | Status: AC
  Administered 2022-09-09: 2 mg via INTRAVENOUS
  Filled 2022-09-09: qty 2

## 2022-09-09 NOTE — ED Triage Notes (Signed)
Pt presents with c/o abdominal pain. Pt was diagnosed with pancreatitis yesterday at this facility and was unable to get her prescription for Dilaudid filled. Pt had pizza last night and her pancreatitis flared up.

## 2022-09-09 NOTE — ED Provider Triage Note (Signed)
Emergency Medicine Provider Triage Evaluation Note  Vernecia Umble , a 57 y.o. female  was evaluated in triage.  Pt complains of intracible vomiting and 10/10 epigastric pain. Pain began at 0120 this morning and patient stated this feels like previous pancreatitis. Emesis has been bilious but no blood. Patient has been unable to take meds.Patient has chest pain from emesis but denied epigastric pain radiating.  Patient denied fevers, HA, syncope  Review of Systems  Positive: See HPI Negative: See HPI  Physical Exam  SpO2 99%  Gen:   Awake, actively vomiting bilious emesis, cannot speak in full sentences due to emesis Resp:  Increased effort MSK:   Moves extremities without difficulty Other:  Epigastric tender to palpation, 2+ bilateral radial pulses, sensation intact  Medical Decision Making  Medically screening exam initiated at 10:06 AM.  Appropriate orders placed.  Sharea Guinther Kratochvil was informed that the remainder of the evaluation will be completed by another provider, this initial triage assessment does not replace that evaluation, and the importance of remaining in the ED until their evaluation is complete.  Workup initiated and zofran and diluadid. History was limited as patient was actively vomiting and could not speak in full sentences. Patient looked sick and was immediately sent to [facility] from triage. Dr. Zenia Resides was notified of patient.   Chuck Hint, PA-C 09/09/22 1010

## 2022-09-09 NOTE — ED Provider Notes (Signed)
[place] [facility]GENC[facility] DEPARTMENT AT [facility] Provider Note   CSN: [id number] Arrival date & time: 09/09/22  7253     History  Chief Complaint  Patient presents with   Abdominal Pain    [name] is a 57 y.o. female.  57 year old female presents with severe epigastric pain that began after she ate pizza.  Recent admission for pancreatitis.  Felt well to go home but then pain began again after she ate pizza.  Patient had been prescribed hydromorphone but did not get the prescription filled.  She has had nausea vomiting.  No fever or chills.  Feels like her prior pancreatitis.  Denies any bloody stools.  No hematemesis       Home Medications Prior to Admission medications   Medication Sig Start Date End Date Taking? Authorizing Provider  acetaminophen (TYLENOL) 500 MG tablet Take 500 mg by mouth 2 (two) times daily as needed for moderate pain.    [provider]  albuterol (PROVENTIL) (2.5 MG/3ML) 0.083% nebulizer solution Take 3 mLs (2.5 mg total) by nebulization every 6 (six) hours as needed for wheezing or shortness of breath. 12/12/19   Tasia Catchings, Amy V, PA-C  albuterol (VENTOLIN HFA) 108 (90 Base) MCG/ACT inhaler Inhale 2 puffs into the lungs every 4 (four) hours as needed for wheezing. 06/07/22   Francene Finders, PA-C  ALPRAZolam Duanne Moron) 1 MG tablet Take 1 mg by mouth daily as needed for anxiety. 10/23/19   [provider]  amLODipine-olmesartan (AZOR) 5-40 MG tablet Take 1 tablet by mouth daily. 06/05/21   [provider]  aspirin EC 81 MG tablet Take 81 mg by mouth daily. Swallow whole.    [provider]  CREON 36000-114000 units CPEP capsule Take 36,000 Units by mouth daily. Patient not taking: Reported on 12/27/2021 10/16/21   [provider]  cyclobenzaprine (FLEXERIL) 10 MG tablet TAKE 1 TABLET(10 MG) BY MOUTH TWICE DAILY AS NEEDED FOR MUSCLE SPASMS Patient taking differently: Take 10 mg by mouth 2 (two) times daily  as needed for muscle spasms. 02/11/22   Jessy Oto, MD  dicyclomine (BENTYL) 10 MG capsule Take 10 mg by mouth daily. 10/16/21   [provider]  escitalopram (LEXAPRO) 20 MG tablet Take 20 mg by mouth daily. 08/10/20   [provider]  fluticasone (FLONASE) 50 MCG/ACT nasal spray Place 1 spray into both nostrils daily as needed for allergies or rhinitis.    [provider]  ibuprofen (ADVIL) 800 MG tablet Take 1 tablet (800 mg total) by mouth every 8 (eight) hours as needed (pain). Patient not taking: Reported on 09/05/2022 12/27/21   Barrett Henle, MD  insulin aspart (NOVOLOG) 100 UNIT/ML FlexPen Inject 0-9 Units into the skin 3 (three) times daily with meals. CBG 70 - 120: 0 units ,CBG 121 - 150:1 unit  ,CBG 151 - 200:2 units, CBG 201 - 250:3 units,CBG 251 - 300:5 units,CBG 301 - 350: 7 units ,CBG 351 - 400:9 units,CBG > 400 call MD and obtain STAT lab verification 09/08/22   Shelly Coss, MD  insulin degludec (TRESIBA) 100 UNIT/ML FlexTouch Pen Inject 30 Units into the skin daily. 09/08/22   Shelly Coss, MD  Insulin Pen Needle 32G X 4 MM MISC 1 each by Does not apply route 3 (three) times daily. 09/08/22   Shelly Coss, MD  metoCLOPramide (REGLAN) 10 MG tablet Take 1 tablet (10 mg total) by mouth every 8 (eight) hours as needed for nausea. 09/08/22  Shelly Coss, MD  metoprolol succinate (TOPROL-XL) 50 MG 24 hr tablet Take 50 mg by mouth daily. 07/21/14   [provider]  nicotine (NICODERM CQ - DOSED IN MG/24 HOURS) 14 mg/24hr patch Place 1 patch (14 mg total) onto the skin daily. 09/09/22   Shelly Coss, MD  polyethylene glycol (MIRALAX / GLYCOLAX) 17 g packet Take 17 g by mouth daily as needed. 09/08/22   Shelly Coss, MD  simvastatin (ZOCOR) 20 MG tablet Take 20 mg by mouth daily. 08/22/20   [provider]  VITAMIN D PO Take 1 tablet by mouth daily.    [provider]  promethazine (PHENERGAN) 25 MG tablet Take 1 tablet (25  mg total) by mouth every 6 (six) hours as needed for nausea or vomiting. Patient not taking: No sig reported 08/15/20 10/19/20  Wieters, Madelynn Done C, PA-C      Allergies    Patient has no known allergies.    Review of Systems   Review of Systems  All other systems reviewed and are negative.   Physical Exam Updated Vital Signs SpO2 99%  Physical Exam Vitals and nursing note reviewed.  Constitutional:      General: She is not in acute distress.    Appearance: Normal appearance. She is well-developed. She is not toxic-appearing.  HENT:     Head: Normocephalic and atraumatic.  Eyes:     General: Lids are normal.     Conjunctiva/sclera: Conjunctivae normal.     Pupils: Pupils are equal, round, and reactive to light.  Neck:     Thyroid: No thyroid mass.     Trachea: No tracheal deviation.  Cardiovascular:     Rate and Rhythm: Normal rate and regular rhythm.     Heart sounds: Normal heart sounds. No murmur heard.    No gallop.  Pulmonary:     Effort: Pulmonary effort is normal. No respiratory distress.     Breath sounds: Normal breath sounds. No stridor. No decreased breath sounds, wheezing, rhonchi or rales.  Abdominal:     General: There is no distension.     Palpations: Abdomen is soft.     Tenderness: There is abdominal tenderness in the epigastric area. There is guarding. There is no rebound.    Musculoskeletal:        General: No tenderness. Normal range of motion.     Cervical back: Normal range of motion and neck supple.  Skin:    General: Skin is warm and dry.     Findings: No abrasion or rash.  Neurological:     Mental Status: She is alert and oriented to person, place, and time. Mental status is at baseline.     GCS: GCS eye subscore is 4. GCS verbal subscore is 5. GCS motor subscore is 6.     Cranial Nerves: No cranial nerve deficit.     Sensory: No sensory deficit.     Motor: Motor function is intact.  Psychiatric:        Attention and Perception: Attention  normal.        Speech: Speech normal.        Behavior: Behavior normal.     ED Results / Procedures / Treatments   Labs (all labs ordered are listed, but only abnormal results are displayed) Labs Reviewed  COMPREHENSIVE METABOLIC PANEL - Abnormal; Notable for the following components:      Result Value   Sodium 134 (*)    CO2 18 (*)    Glucose,  Bld 346 (*)    Total Protein 8.4 (*)    Anion gap 16 (*)    All other components within normal limits  CBC WITH DIFFERENTIAL/PLATELET - Abnormal; Notable for the following components:   WBC 13.7 (*)    RBC 5.68 (*)    Hemoglobin 15.3 (*)    Neutro Abs 12.0 (*)    All other components within normal limits  I-STAT CHEM 8, ED - Abnormal; Notable for the following components:   Glucose, Bld 357 (*)    Calcium, Ion 1.07 (*)    TCO2 20 (*)    Hemoglobin 16.3 (*)    HCT 48.0 (*)    All other components within normal limits  LIPASE, BLOOD    EKG None  Radiology No results found.  Procedures Procedures    Medications Ordered in ED Medications  ondansetron (ZOFRAN) injection 4 mg (4 mg Intravenous Given 09/09/22 1012)  HYDROmorphone (DILAUDID) injection 1 mg (1 mg Intravenous Given 09/09/22 1012)    ED Course/ Medical Decision Making/ A&P                             Medical Decision Making Amount and/or Complexity of Data Reviewed Radiology: ordered.  Risk Prescription drug management.   Patient presented with acute onset epigastric pain after eating pizza.  Patient had associated nausea and vomiting but no fever or chills.  Concern initially for recurrence of her pancreatitis.  Patient is lipase is within normal limits.  Abdominal CT per interpretation showed no acute findings.  Given IV hydration along with IV analgesics and patient does feel better.  Patient does not require admission at this time.  Patient will go to the pharmacy to get her prescription for hydromorphone filled  She instructed to avoid all greasy spicy  foods and follow-up with her doctor as needed        Final Clinical Impression(s) / ED Diagnoses Final diagnoses:  None    Rx / DC Orders ED Discharge Orders     None         Lacretia Leigh, MD 09/09/22 1221

## 2022-09-19 ENCOUNTER — Other Ambulatory Visit: Payer: Self-pay | Admitting: Internal Medicine

## 2022-09-19 DIAGNOSIS — Z1231 Encounter for screening mammogram for malignant neoplasm of breast: Secondary | ICD-10-CM

## 2022-10-01 ENCOUNTER — Ambulatory Visit
Admission: RE | Admit: 2022-10-01 | Discharge: 2022-10-01 | Disposition: A | Discharge status: Home / Self Care | Payer: 59 | Source: Ambulatory Visit | Attending: Internal Medicine | Admitting: Internal Medicine

## 2022-10-01 DIAGNOSIS — Z1231 Encounter for screening mammogram for malignant neoplasm of breast: Secondary | ICD-10-CM

## 2022-10-17 ENCOUNTER — Ambulatory Visit: Payer: 59 | Admitting: Dietician

## 2023-01-03 ENCOUNTER — Ambulatory Visit
Admission: EM | Admit: 2023-01-03 | Discharge: 2023-01-03 | Disposition: A | Discharge status: Home / Self Care | Payer: 59 | Attending: Family Medicine | Admitting: Family Medicine

## 2023-01-03 DIAGNOSIS — E11628 Type 2 diabetes mellitus with other skin complications: Secondary | ICD-10-CM

## 2023-01-03 DIAGNOSIS — L02214 Cutaneous abscess of groin: Secondary | ICD-10-CM

## 2023-01-03 LAB — POCT URINALYSIS DIP (MANUAL ENTRY)
Bilirubin, UA: NEGATIVE
Blood, UA: NEGATIVE
Glucose, UA: 500 mg/dL — AB
Ketones, POC UA: NEGATIVE mg/dL
Leukocytes, UA: NEGATIVE
Nitrite, UA: NEGATIVE
Spec Grav, UA: 1.025 (ref 1.010–1.025)
Urobilinogen, UA: 0.2 E.U./dL
pH, UA: 5.5 (ref 5.0–8.0)

## 2023-01-03 LAB — POCT FASTING CBG KUC MANUAL ENTRY: POCT Glucose (KUC): 358 mg/dL — AB (ref 70–99)

## 2023-01-03 MED ORDER — SULFAMETHOXAZOLE-TRIMETHOPRIM 800-160 MG PO TABS: 1.0000 | ORAL_TABLET | Freq: Two times a day (BID) | ORAL | 0 refills | Status: DC

## 2023-01-03 NOTE — ED Triage Notes (Addendum)
Pt reports she has an abscess on her left inner thigh that is painful to sit and stand x 1 day. Pt states it "just popped up".    Pt is also complaining of dizziness.

## 2023-01-03 NOTE — ED Provider Notes (Signed)
[facility]    CSN: [id number] Arrival date & time: 01/03/23  0807      History   Chief Complaint No chief complaint on file.   HPI [name] is a 57 y.o. female.   HPI Presents today for evaluation of a boil which is currently painful that erupted overnight in the right groin region.  Patient reports she has had a previous boil that became infected in the same area sometime back that required antibiotics along with an I&D.  On arrival patient's blood sugar is elevated at 358.  She reports that her blood sugars have been all over the place that she is recently diagnosed with acute pancreatitis and dealing with other health issues which has caused her blood sugar to be erratic.  She has not eaten anything this morning therefore she did not give herself insulin.  Currently on an antibiotic with azithromycin for upper respiratory infection.  She is currently afebrile.  Past Medical History:  Diagnosis Date   Anxiety    Arthritis    rheumatoid , osteoarthritis   Bronchitis    Chronic back pain    Depression    Diabetes mellitus    Fatty liver    Fibromyalgia    GERD (gastroesophageal reflux disease)    Headache    Hypertension    Pancreatitis    Pneumonia    Rotator cuff tear, right     Patient Active Problem List   Diagnosis Date Noted   GERD (gastroesophageal reflux disease) 08/14/2022   HLD (hyperlipidemia) 08/14/2022   Adrenal adenoma, left 09/13/2021   Intractable nausea and vomiting 09/12/2021   Anxiety 09/12/2021   Gastroenteritis 09/09/2021   Diabetes mellitus type 2 in obese 09/09/2021   Class 2 obesity due to excess calories with body mass index (BMI) of 36.0 to 36.9 in adult 09/09/2021   Marijuana abuse 09/09/2021   Tobacco dependence 09/09/2021   Essential hypertension 05/29/2020   Abdominal pain 11/23/2019   Acute pancreatitis 11/21/2019   De Quervain's syndrome (tenosynovitis) 07/06/2018   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   Complete  tear of right rotator cuff 07/22/2017    Class: Chronic   Retained orthopedic hardware 07/22/2017    Class: Chronic   Cervical disc herniation 07/21/2017    Class: Acute   Status post cervical spinal fusion 07/21/17 07/21/2017   Chronic back pain     Past Surgical History:  Procedure Laterality Date   ABDOMINAL HYSTERECTOMY     ANTERIOR CERVICAL DECOMP/DISCECTOMY FUSION N/A 07/21/2017   Procedure: ANTERIOR CERVICAL DISCECTOMY AND FUSION C6-7, REMOVAL OF SCREW C6 PLATE, DEPUY ZERO-P IMPLANT, LOCAL BONE GRAFT, ALLOGRAFT BONE GRAFT, VIVIGEN;  Surgeon: Kerrin Champagne, MD;  Location: [facility] OR;  Service: Orthopedics;  Laterality: N/A;   BACK SURGERY     laminectomy   BONE GRAFT HIP ILIAC CREST     BREAST BIOPSY     CARPAL TUNNEL RELEASE     CERVICAL DISCECTOMY  07/21/2017   CESAREAN SECTION     CHOLECYSTECTOMY     COLONOSCOPY     frontal fusion     neck fusion     patient reported   SHOULDER ARTHROSCOPY WITH SUBACROMIAL DECOMPRESSION, ROTATOR CUFF REPAIR AND BICEP TENDON REPAIR Right 10/31/2017   Procedure: RIGHT SHOULDER ARTHROSCOPY, MANIPULATION UNDER ANESTHESIA, BICEPS TENODESIS, AND MINI OPEN ROTATOR CUFF TEAR REPAIR;  Surgeon: Cammy Copa, MD;  Location: [facility] OR;  Service: Orthopedics;  Laterality: Right;    OB History   No obstetric history  on file.      Home Medications    Prior to Admission medications   Medication Sig Start Date End Date Taking? Authorizing Provider  sulfamethoxazole-trimethoprim (BACTRIM DS) 800-160 MG tablet Take 1 tablet by mouth 2 (two) times daily. 01/03/23  Yes Bing Neighbors, NP  acetaminophen (TYLENOL) 500 MG tablet Take 500 mg by mouth 2 (two) times daily as needed for moderate pain.    [provider]  albuterol (PROVENTIL) (2.5 MG/3ML) 0.083% nebulizer solution Take 3 mLs (2.5 mg total) by nebulization every 6 (six) hours as needed for wheezing or shortness of breath. 12/12/19   Cathie Hoops, Amy V, PA-C  albuterol (VENTOLIN HFA) 108 (90  Base) MCG/ACT inhaler Inhale 2 puffs into the lungs every 4 (four) hours as needed for wheezing. 06/07/22   Tomi Bamberger, PA-C  ALPRAZolam Prudy Feeler) 1 MG tablet Take 1 mg by mouth daily as needed for anxiety. 10/23/19   [provider]  amLODipine-olmesartan (AZOR) 5-40 MG tablet Take 1 tablet by mouth daily. 06/05/21   [provider]  aspirin EC 81 MG tablet Take 81 mg by mouth daily. Swallow whole.    [provider]  CREON 36000-114000 units CPEP capsule Take 36,000 Units by mouth daily. Patient not taking: Reported on 12/27/2021 10/16/21   [provider]  cyclobenzaprine (FLEXERIL) 10 MG tablet TAKE 1 TABLET(10 MG) BY MOUTH TWICE DAILY AS NEEDED FOR MUSCLE SPASMS Patient taking differently: Take 10 mg by mouth 2 (two) times daily as needed for muscle spasms. 02/11/22   Kerrin Champagne, MD  dicyclomine (BENTYL) 10 MG capsule Take 10 mg by mouth daily. 10/16/21   [provider]  escitalopram (LEXAPRO) 20 MG tablet Take 20 mg by mouth daily. 08/10/20   [provider]  fluticasone (FLONASE) 50 MCG/ACT nasal spray Place 1 spray into both nostrils daily as needed for allergies or rhinitis.    [provider]  ibuprofen (ADVIL) 800 MG tablet Take 1 tablet (800 mg total) by mouth every 8 (eight) hours as needed (pain). Patient not taking: Reported on 09/05/2022 12/27/21   Zenia Resides, MD  insulin aspart (NOVOLOG) 100 UNIT/ML FlexPen Inject 0-9 Units into the skin 3 (three) times daily with meals. CBG 70 - 120: 0 units ,CBG 121 - 150:1 unit  ,CBG 151 - 200:2 units, CBG 201 - 250:3 units,CBG 251 - 300:5 units,CBG 301 - 350: 7 units ,CBG 351 - 400:9 units,CBG > 400 call MD and obtain STAT lab verification 09/08/22   Burnadette Pop, MD  insulin degludec (TRESIBA) 100 UNIT/ML FlexTouch Pen Inject 30 Units into the skin daily. 09/08/22   Burnadette Pop, MD  Insulin Pen Needle 32G X 4 MM MISC 1 each by Does not apply route 3 (three) times daily.  09/08/22   Burnadette Pop, MD  metoCLOPramide (REGLAN) 10 MG tablet Take 1 tablet (10 mg total) by mouth every 8 (eight) hours as needed for nausea. 09/08/22   Burnadette Pop, MD  metoprolol succinate (TOPROL-XL) 50 MG 24 hr tablet Take 50 mg by mouth daily. 07/21/14   [provider]  nicotine (NICODERM CQ - DOSED IN MG/24 HOURS) 14 mg/24hr patch Place 1 patch (14 mg total) onto the skin daily. 09/09/22   Burnadette Pop, MD  polyethylene glycol (MIRALAX / GLYCOLAX) 17 g packet Take 17 g by mouth daily as needed. 09/08/22   Burnadette Pop, MD  simvastatin (ZOCOR) 20 MG tablet Take 20 mg by mouth daily. 08/22/20   [provider]  VITAMIN D PO Take 1 tablet by mouth daily.    [provider]  promethazine (PHENERGAN) 25 MG tablet Take 1 tablet (25 mg total) by mouth every 6 (six) hours as needed for nausea or vomiting. Patient not taking: No sig reported 08/15/20 10/19/20  Wieters, Junius Creamer, PA-C    Family History Family History  Problem Relation Age of Onset   Hypertension Father    Renal Disease Father    Diabetes Mother    Hypertension Mother    Edema Mother    Diabetes Sister    Diabetes Brother     Social History Social History   Tobacco Use   Smoking status: Every Day    Packs/day: 0.50    Years: 40.00    Additional pack years: 0.00    Total pack years: 20.00    Types: Cigarettes   Smokeless tobacco: Never  Vaping Use   Vaping Use: Never used  Substance Use Topics   Alcohol use: No   Drug use: Yes    Types: Marijuana    Comment: 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   Review of Systems Review of Systems Pertinent negatives listed in HPI   Physical Exam Triage Vital Signs ED Triage Vitals  Enc Vitals Group     BP 01/03/23 0833 (!) 141/81     Pulse Rate 01/03/23 0833 (!) 105     Resp 01/03/23 0833 20     Temp 01/03/23 0833 98 F  (36.7 C)     Temp Source 01/03/23 0833 Oral     SpO2 01/03/23 0833 94 %     Weight --      Height --      Head Circumference --      Peak Flow --      Pain Score 01/03/23 0834 8     Pain Loc --      Pain Edu? --      Excl. in GC? --    No data found.  Updated Vital Signs BP (!) 141/81 (BP Location: Right Arm)   Pulse (!) 105   Temp 98 F (36.7 C) (Oral)   Resp 20   SpO2 94%   Visual Acuity Right Eye Distance:   Left Eye Distance:   Bilateral Distance:    Right Eye Near:   Left Eye Near:    Bilateral Near:     Physical Exam Constitutional: Patient appears well-developed and well-nourished. No distress. HENT: Normocephalic, atraumatic, External right and left ear normal.  Eyes: Conjunctivae and EOM are normal. PERRLA, no scleral icterus. CVS: RRR, S1/S2 +, no murmurs, no gallops, no carotid bruit. Pulmonary: Regular respiratory rate, no acute distress  Neuro: Alert.  No focal abnormalities  Genitourinary: Indurated erythematous boil right mid groin tenderness elicited with palpation Skin: Skin is warm and dry. No rash noted. Not diaphoretic. No erythema. No pallor. Psychiatric: Normal mood and affect. Behavior, judgment, thought content normal.  UC Treatments / Results  Labs (all labs ordered are listed, but only abnormal results are displayed) Labs Reviewed  POCT FASTING CBG [facility] MANUAL ENTRY - Abnormal; Notable for the following components:      Result Value   POCT Glucose ([facility]) 358 (*)    All other components within normal limits  POCT URINALYSIS DIP (MANUAL ENTRY)    EKG   Radiology No results found.  Procedures Incision and Drainage  Date/Time: 01/03/2023 9:10 AM  Performed by: Bing Neighbors, NP Authorized by: Bing Neighbors, NP   Consent:    Consent obtained:  Verbal   Consent given by:  Patient   Risks discussed:  Incomplete drainage and infection   Alternatives discussed:  No treatment and delayed treatment Universal protocol:     Patient identity confirmed:  Verbally with patient Location:    Type:  Abscess   Location:  Anogenital   Anogenital location: right inner groin region. Pre-procedure details:    Skin preparation:  Povidone-iodine Anesthesia:    Anesthesia method:  Topical application   Topical anesthetic:  LET Procedure type:    Complexity:  Complex Procedure details:    Incision types:  Stab incision   Incision depth:  Dermal   Drainage:  Purulent and serosanguinous   Drainage amount:  Copious   Wound treatment:  Wound left open   Packing materials:  None Post-procedure details:    Procedure completion:  Tolerated Comments:     Dressed with non-adherent dressing, patient tolerated I&D  (including critical care time)  Medications Ordered in UC Medications - No data to display  Initial Impression / Assessment and Plan / UC Course  I have reviewed the triage vital signs and the nursing notes.  Pertinent labs & imaging results that were available during my care of the patient were reviewed by me and considered in my medical decision making (see chart for details).    I&D tolerated.  Patient encouraged to monitor blood sugar throughout the day as a suspect it is elevated due to skin infection.  Advised to eat and take insulin.  Obtained a urine analysis. Start Bactrim double strength twice daily for total of 10 days.  Wound care instruction discussed.  Red flag precautions given if blood sugar continues to remain elevated after treatment with insulin.  Patient verbalized understanding and agreement with plan. Final Clinical Impressions(s) / UC Diagnoses   Final diagnoses:  Abscess of groin, right  Type 2 diabetes mellitus with other skin complication, without long-term current use of insulin (HCC)     Discharge Instructions      Discontinue azithromycin.  I am starting you on Bactrim twice daily for 10 days. You can bathe as usual with regular warm soap and water.  I recommend changing  dressing twice daily over the next 48 to 72 hours drainage should resolve after 3 days. Blood sugar is elevated today I suspect is related to your active infection. Continue to monitor blood sugar at home, if readings persist >300 with use of insulin, please call your primary care doctor to have dose adjusted.  Your blood sugar goes greater than 400 despite use of insulin go immediately to the emergency department.      ED Prescriptions     Medication Sig Dispense Auth. Provider   sulfamethoxazole-trimethoprim (BACTRIM DS) 800-160 MG tablet Take 1 tablet by mouth 2 (two) times daily. 20 tablet Bing Neighbors, NP      PDMP not reviewed this encounter.   Bing Neighbors, NP 01/03/23 1002

## 2023-01-03 NOTE — Discharge Instructions (Addendum)
Discontinue azithromycin.  I am starting you on Bactrim twice daily for 10 days. You can bathe as usual with regular warm soap and water.  I recommend changing dressing twice daily over the next 48 to 72 hours drainage should resolve after 3 days. Blood sugar is elevated today I suspect is related to your active infection. Continue to monitor blood sugar at home, if readings persist >300 with use of insulin, please call your primary care doctor to have dose adjusted.  Your blood sugar goes greater than 400 despite use of insulin go immediately to the emergency department.

## 2023-02-22 ENCOUNTER — Emergency Department (HOSPITAL_COMMUNITY): Payer: Medicare HMO

## 2023-02-22 ENCOUNTER — Encounter (HOSPITAL_COMMUNITY): Payer: Self-pay

## 2023-02-22 ENCOUNTER — Other Ambulatory Visit: Payer: Self-pay

## 2023-02-22 ENCOUNTER — Emergency Department (HOSPITAL_COMMUNITY)
Admission: EM | Admit: 2023-02-22 | Discharge: 2023-02-22 | Disposition: A | Discharge status: Home / Self Care | Payer: Medicare HMO | Attending: Emergency Medicine | Admitting: Emergency Medicine

## 2023-02-22 DIAGNOSIS — I10 Essential (primary) hypertension: Secondary | ICD-10-CM | POA: Insufficient documentation

## 2023-02-22 DIAGNOSIS — R110 Nausea: Secondary | ICD-10-CM | POA: Insufficient documentation

## 2023-02-22 DIAGNOSIS — Z7982 Long term (current) use of aspirin: Secondary | ICD-10-CM | POA: Insufficient documentation

## 2023-02-22 DIAGNOSIS — E119 Type 2 diabetes mellitus without complications: Secondary | ICD-10-CM | POA: Diagnosis not present

## 2023-02-22 DIAGNOSIS — M25511 Pain in right shoulder: Secondary | ICD-10-CM | POA: Insufficient documentation

## 2023-02-22 DIAGNOSIS — R0789 Other chest pain: Secondary | ICD-10-CM | POA: Diagnosis not present

## 2023-02-22 DIAGNOSIS — Z794 Long term (current) use of insulin: Secondary | ICD-10-CM | POA: Diagnosis not present

## 2023-02-22 DIAGNOSIS — R0602 Shortness of breath: Secondary | ICD-10-CM | POA: Insufficient documentation

## 2023-02-22 DIAGNOSIS — R079 Chest pain, unspecified: Secondary | ICD-10-CM | POA: Diagnosis not present

## 2023-02-22 LAB — LIPASE, BLOOD: Lipase: 44 U/L (ref 11–51)

## 2023-02-22 LAB — COMPREHENSIVE METABOLIC PANEL
ALT: 15 U/L (ref 0–44)
AST: 23 U/L (ref 15–41)
Albumin: 4.3 g/dL (ref 3.5–5.0)
Alkaline Phosphatase: 107 U/L (ref 38–126)
Anion gap: 8 (ref 5–15)
BUN: 10 mg/dL (ref 6–20)
CO2: 24 mmol/L (ref 22–32)
Calcium: 8.8 mg/dL — ABNORMAL LOW (ref 8.9–10.3)
Chloride: 105 mmol/L (ref 98–111)
Creatinine, Ser: 0.66 mg/dL (ref 0.44–1.00)
GFR, Estimated: >60 mL/min (ref >60)
Glucose, Bld: 102 mg/dL — ABNORMAL HIGH (ref 70–99)
Potassium: 3.8 mmol/L (ref 3.5–5.1)
Sodium: 137 mmol/L (ref 135–145)
Total Bilirubin: 0.7 mg/dL (ref 0.3–1.2)
Total Protein: 8.2 g/dL — ABNORMAL HIGH (ref 6.5–8.1)

## 2023-02-22 LAB — CBC
HCT: 42.5 % (ref 36.0–46.0)
Hemoglobin: 13.6 g/dL (ref 12.0–15.0)
MCH: 26.3 pg (ref 26.0–34.0)
MCHC: 32 g/dL (ref 30.0–36.0)
MCV: 82.2 fL (ref 80.0–100.0)
Platelets: 389 10*3/uL (ref 150–400)
RBC: 5.17 MIL/uL — ABNORMAL HIGH (ref 3.87–5.11)
RDW: 15 % (ref 11.5–15.5)
WBC: 10.7 10*3/uL — ABNORMAL HIGH (ref 4.0–10.5)
nRBC: 0 % (ref 0.0–0.2)

## 2023-02-22 LAB — TROPONIN I (HIGH SENSITIVITY): Troponin I (High Sensitivity): 3 ng/L (ref <18)

## 2023-02-22 LAB — D-DIMER, QUANTITATIVE: D-Dimer, Quant: 0.3 ug/mL-FEU (ref 0.00–0.50)

## 2023-02-22 MED ORDER — KETOROLAC TROMETHAMINE 15 MG/ML IJ SOLN
15.0000 mg | Freq: Once | INTRAMUSCULAR | Status: AC
  Administered 2023-02-22: 15 mg via INTRAVENOUS
  Filled 2023-02-22: qty 1

## 2023-02-22 MED ORDER — NITROGLYCERIN 2 % TD OINT
1.0000 [in_us] | TOPICAL_OINTMENT | Freq: Once | TRANSDERMAL | Status: AC
  Administered 2023-02-22: 1 [in_us] via TOPICAL
  Filled 2023-02-22: qty 1

## 2023-02-22 MED ORDER — ACETAMINOPHEN 500 MG PO TABS
1000.0000 mg | ORAL_TABLET | Freq: Once | ORAL | Status: AC
  Administered 2023-02-22: 1000 mg via ORAL
  Filled 2023-02-22: qty 2

## 2023-02-22 MED ORDER — ACETAMINOPHEN 500 MG PO TABS: 1000.0000 mg | ORAL_TABLET | Freq: Once | ORAL | Status: DC

## 2023-02-22 MED ORDER — CYCLOBENZAPRINE HCL 10 MG PO TABS: 10.0000 mg | ORAL_TABLET | Freq: Every day | ORAL | 0 refills | Status: AC

## 2023-02-22 NOTE — ED Triage Notes (Signed)
Pt. Arrives POV c/o chest pain, SOB and nausea. Pt. States that her pain extends to the abdomen. She is also experiencing tingling to the L hand.

## 2023-02-22 NOTE — Discharge Instructions (Signed)
You may use over-the-counter Motrin (Ibuprofen), Acetaminophen (Tylenol), topical muscle creams such as SalonPas, Icy Hot, Bengay, etc. Please stretch, apply ice or heat (whichever helps), and have massage therapy for additional assistance.  

## 2023-02-22 NOTE — ED Provider Notes (Signed)
[place] EMERGENCY DEPARTMENT AT [facility] Provider Note  CSN: [id number] Arrival date & time: 02/22/23 4132  Chief Complaint(s) Chest Pain  HPI [name] is a 57 y.o. female with a past medical history listed below including hypertension, diabetes, prior pancreatitis and fibromyalgia who presents to the emergency department with right-sided chest pain since Monday worse with movement and palpation.  Pain radiates to the right upper abdomen.  She is concerned she has got recurrent pancreatitis.  She endorses shortness of breath due to the pain.  She denies any coughing or congestion.  Endorses improved nausea.  No vomiting.  No diarrhea.  No urinary symptoms.  The history is provided by the patient.    Past Medical History Past Medical History:  Diagnosis Date   Anxiety    Arthritis    rheumatoid , osteoarthritis   Bronchitis    Chronic back pain    Depression    Diabetes mellitus    Fatty liver    Fibromyalgia    GERD (gastroesophageal reflux disease)    Headache    Hypertension    Pancreatitis    Pneumonia    Rotator cuff tear, right    Patient Active Problem List   Diagnosis Date Noted   GERD (gastroesophageal reflux disease) 08/14/2022   HLD (hyperlipidemia) 08/14/2022   Adrenal adenoma, left 09/13/2021   Intractable nausea and vomiting 09/12/2021   Anxiety 09/12/2021   Gastroenteritis 09/09/2021   Diabetes mellitus type 2 in obese 09/09/2021   Class 2 obesity due to excess calories with body mass index (BMI) of 36.0 to 36.9 in adult 09/09/2021   Marijuana abuse 09/09/2021   Tobacco dependence 09/09/2021   Essential hypertension 05/29/2020   Abdominal pain 11/23/2019   Acute pancreatitis 11/21/2019   De Quervain's syndrome (tenosynovitis) 07/06/2018   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   Complete tear of right rotator cuff 07/22/2017    Class: Chronic   Retained orthopedic hardware 07/22/2017    Class: Chronic   Cervical disc herniation  07/21/2017    Class: Acute   Status post cervical spinal fusion 07/21/17 07/21/2017   Chronic back pain    Home Medication(s) Prior to Admission medications   Medication Sig Start Date End Date Taking? Authorizing Provider  cyclobenzaprine (FLEXERIL) 10 MG tablet Take 1 tablet (10 mg total) by mouth at bedtime for 10 days. 02/22/23 03/04/23 Yes Katheline Brendlinger, Amadeo Garnet, MD  acetaminophen (TYLENOL) 500 MG tablet Take 500 mg by mouth 2 (two) times daily as needed for moderate pain.    [provider]  albuterol (PROVENTIL) (2.5 MG/3ML) 0.083% nebulizer solution Take 3 mLs (2.5 mg total) by nebulization every 6 (six) hours as needed for wheezing or shortness of breath. 12/12/19   Cathie Hoops, Amy V, PA-C  albuterol (VENTOLIN HFA) 108 (90 Base) MCG/ACT inhaler Inhale 2 puffs into the lungs every 4 (four) hours as needed for wheezing. 06/07/22   Tomi Bamberger, PA-C  ALPRAZolam Prudy Feeler) 1 MG tablet Take 1 mg by mouth daily as needed for anxiety. 10/23/19   [provider]  amLODipine-olmesartan (AZOR) 5-40 MG tablet Take 1 tablet by mouth daily. 06/05/21   [provider]  aspirin EC 81 MG tablet Take 81 mg by mouth daily. Swallow whole.    [provider]  CREON 36000-114000 units CPEP capsule Take 36,000 Units by mouth daily. Patient not taking: Reported on 12/27/2021 10/16/21   [provider]  dicyclomine (BENTYL) 10 MG capsule Take 10 mg by mouth daily. 10/16/21  [provider]  escitalopram (LEXAPRO) 20 MG tablet Take 20 mg by mouth daily. 08/10/20   [provider]  fluticasone (FLONASE) 50 MCG/ACT nasal spray Place 1 spray into both nostrils daily as needed for allergies or rhinitis.    [provider]  ibuprofen (ADVIL) 800 MG tablet Take 1 tablet (800 mg total) by mouth every 8 (eight) hours as needed (pain). Patient not taking: Reported on 09/05/2022 12/27/21   Zenia Resides, MD  insulin aspart (NOVOLOG) 100 UNIT/ML FlexPen Inject  0-9 Units into the skin 3 (three) times daily with meals. CBG 70 - 120: 0 units ,CBG 121 - 150:1 unit  ,CBG 151 - 200:2 units, CBG 201 - 250:3 units,CBG 251 - 300:5 units,CBG 301 - 350: 7 units ,CBG 351 - 400:9 units,CBG > 400 call MD and obtain STAT lab verification 09/08/22   Burnadette Pop, MD  insulin degludec (TRESIBA) 100 UNIT/ML FlexTouch Pen Inject 30 Units into the skin daily. 09/08/22   Burnadette Pop, MD  Insulin Pen Needle 32G X 4 MM MISC 1 each by Does not apply route 3 (three) times daily. 09/08/22   Burnadette Pop, MD  metoCLOPramide (REGLAN) 10 MG tablet Take 1 tablet (10 mg total) by mouth every 8 (eight) hours as needed for nausea. 09/08/22   Burnadette Pop, MD  metoprolol succinate (TOPROL-XL) 50 MG 24 hr tablet Take 50 mg by mouth daily. 07/21/14   [provider]  nicotine (NICODERM CQ - DOSED IN MG/24 HOURS) 14 mg/24hr patch Place 1 patch (14 mg total) onto the skin daily. 09/09/22   Burnadette Pop, MD  polyethylene glycol (MIRALAX / GLYCOLAX) 17 g packet Take 17 g by mouth daily as needed. 09/08/22   Burnadette Pop, MD  simvastatin (ZOCOR) 20 MG tablet Take 20 mg by mouth daily. 08/22/20   [provider]  sulfamethoxazole-trimethoprim (BACTRIM DS) 800-160 MG tablet Take 1 tablet by mouth 2 (two) times daily. 01/03/23   Bing Neighbors, NP  VITAMIN D PO Take 1 tablet by mouth daily.    [provider]  promethazine (PHENERGAN) 25 MG tablet Take 1 tablet (25 mg total) by mouth every 6 (six) hours as needed for nausea or vomiting. Patient not taking: No sig reported 08/15/20 10/19/20  Wieters, Fran Lowes C, PA-C                                                                                                                                    Allergies Patient has no known allergies.  Review of Systems Review of Systems As noted in HPI  Physical Exam Vital Signs  I have reviewed the triage vital signs BP (!) 145/86 (BP Location: Right Arm)   Pulse (!)  104   Temp 98.4 F (36.9 C)   Resp 18   Ht 5\' 5"  (1.651 m)   Wt 106.6 kg   SpO2 96%   BMI 39.11 kg/m  Physical Exam Vitals reviewed.  Constitutional:      General: She is not in acute distress.    Appearance: She is well-developed. She is not diaphoretic.  HENT:     Head: Normocephalic and atraumatic.     Nose: Nose normal.  Eyes:     General: No scleral icterus.       Right eye: No discharge.        Left eye: No discharge.     Conjunctiva/sclera: Conjunctivae normal.     Pupils: Pupils are equal, round, and reactive to light.  Cardiovascular:     Rate and Rhythm: Normal rate and regular rhythm.     Heart sounds: No murmur heard.    No friction rub. No gallop.  Pulmonary:     Effort: Pulmonary effort is normal. No respiratory distress.     Breath sounds: Normal breath sounds. No stridor. No rales.  Chest:     Chest wall: Tenderness present.    Abdominal:     General: There is no distension.     Palpations: Abdomen is soft.     Tenderness: There is no abdominal tenderness.  Musculoskeletal:     Cervical back: Normal range of motion and neck supple. Spasms and tenderness present. No bony tenderness.       Back:  Skin:    General: Skin is warm and dry.     Findings: No erythema or rash.  Neurological:     Mental Status: She is alert and oriented to person, place, and time.     ED Results and Treatments Labs (all labs ordered are listed, but only abnormal results are displayed) Labs Reviewed  CBC - Abnormal; Notable for the following components:      Result Value   WBC 10.7 (*)    RBC 5.17 (*)    All other components within normal limits  COMPREHENSIVE METABOLIC PANEL - Abnormal; Notable for the following components:   Glucose, Bld 102 (*)    Calcium 8.8 (*)    Total Protein 8.2 (*)    All other components within normal limits  LIPASE, BLOOD  D-DIMER, QUANTITATIVE  TROPONIN I (HIGH SENSITIVITY)                                                                                                                          EKG  EKG Interpretation Date/Time:  Saturday February 22 2023 04:08:49 EDT Ventricular Rate:  107 PR Interval:  163 QRS Duration:  87 QT Interval:  382 QTC Calculation: 510 R Axis:   20  Text Interpretation: Sinus tachycardia Ventricular trigeminy Anteroseptal infarct, age indeterminate Confirmed by Drema Pry [phone]) on 02/22/2023 4:17:32 AM       Radiology DG Chest Port 1 View  Result Date: 02/22/2023 CLINICAL DATA:  57 year old female with history of chest pain. EXAM: PORTABLE CHEST 1 VIEW COMPARISON:  Chest x-ray 09/09/2021. FINDINGS: Images under penetrated, limiting the diagnostic sensitivity and specificity of the examination. With these limitations  in mind, there are poorly defined opacities overlying the lower lungs, favored to reflect overlying soft tissue in this patient with large body habitus, although pleural effusions are difficult to entirely exclude. Bibasilar areas of atelectasis and/or consolidation are also not excluded. Mid to upper lungs appear clear. No pneumothorax. No evidence of pulmonary edema. Heart size is upper limits of normal. The patient is rotated to the left on today's exam, resulting in distortion of the mediastinal contours and reduced diagnostic sensitivity and specificity for mediastinal pathology. Orthopedic fixation hardware in the lower cervical spine incidentally noted. IMPRESSION: 1. Poor quality portable chest x-ray limited by technical factors, as above. Repeat standing PA and lateral chest x-ray should be considered. Electronically Signed   By: Trudie Reed M.D.   On: 02/22/2023 05:42    Medications Ordered in ED Medications  ketorolac (TORADOL) 15 MG/ML injection 15 mg (has no administration in time range)  nitroGLYCERIN (NITROGLYN) 2 % ointment 1 inch (1 inch Topical Given 02/22/23 0508)  ketorolac (TORADOL) 15 MG/ML injection 15 mg (15 mg Intravenous Given 02/22/23 0728)   acetaminophen (TYLENOL) tablet 1,000 mg (1,000 mg Oral Given 02/22/23 1610)   Procedures Procedures  (including critical care time) Medical Decision Making / ED Course   Medical Decision Making Amount and/or Complexity of Data Reviewed Labs: ordered. Decision-making details documented in ED Course. Radiology: ordered and independent interpretation performed. Decision-making details documented in ED Course. ECG/medicine tests: ordered and independent interpretation performed. Decision-making details documented in ED Course.  Risk OTC drugs. Prescription drug management.    Right-sided chest pain Appears to be most consistent with chest wall etiology. Will rule out ACS.  Given the duration of the pain, single troponin should be sufficient. No lower suspicion for PE or dissection but will obtain a dimer given her history of rheumatoid arthritis. Will assess for evidence of biliary disease or pancreatitis  EKG without acute ischemic changes. Troponin negative D-dimer negative CBC with mild leukocytosis.  No anemia. Metabolic panel without significant electrolyte derangements or renal sufficiency. No evidence of bili obstruction or pancreatitis  Chest x-ray without evidence of pneumonia, pneumothorax, pulmonary edema or pleural effusion.  Patient treated symptomatically initially with nitroglycerin paste as well as Toradol and Tylenol. Will treat for MSK.   Final Clinical Impression(s) / ED Diagnoses Final diagnoses:  Pain of right shoulder region   The patient appears reasonably screened and/or stabilized for discharge and I doubt any other medical condition or other [facility] requiring further screening, evaluation, or treatment in the ED at this time. I have discussed the findings, Dx and Tx plan with the patient/family who expressed understanding and agree(s) with the plan. Discharge instructions discussed at length. The patient/family was given strict return precautions who  verbalized understanding of the instructions. No further questions at time of discharge.  Disposition: Discharge  Condition: Good  ED Discharge Orders          Ordered    cyclobenzaprine (FLEXERIL) 10 MG tablet  Daily at bedtime        02/22/23 0805              Follow Up: Fleet Contras, MD [address] [place] [address] [phone]  Call      This chart was dictated using voice recognition software.  Despite best efforts to proofread,  errors can occur which can change the documentation meaning.    Nira Conn, MD 02/22/23 [phone]

## 2023-02-24 ENCOUNTER — Emergency Department (HOSPITAL_COMMUNITY): Payer: Medicare HMO

## 2023-02-24 ENCOUNTER — Encounter (HOSPITAL_COMMUNITY): Payer: Self-pay

## 2023-02-24 ENCOUNTER — Other Ambulatory Visit: Payer: Self-pay

## 2023-02-24 ENCOUNTER — Emergency Department (HOSPITAL_COMMUNITY)
Admission: EM | Admit: 2023-02-24 | Discharge: 2023-02-24 | Disposition: A | Discharge status: Home / Self Care | Payer: Medicare HMO | Attending: Emergency Medicine | Admitting: Emergency Medicine

## 2023-02-24 DIAGNOSIS — R109 Unspecified abdominal pain: Secondary | ICD-10-CM | POA: Diagnosis present

## 2023-02-24 DIAGNOSIS — K449 Diaphragmatic hernia without obstruction or gangrene: Secondary | ICD-10-CM | POA: Diagnosis not present

## 2023-02-24 DIAGNOSIS — R1084 Generalized abdominal pain: Secondary | ICD-10-CM | POA: Insufficient documentation

## 2023-02-24 DIAGNOSIS — Z7982 Long term (current) use of aspirin: Secondary | ICD-10-CM | POA: Insufficient documentation

## 2023-02-24 DIAGNOSIS — Z794 Long term (current) use of insulin: Secondary | ICD-10-CM | POA: Insufficient documentation

## 2023-02-24 LAB — CBC WITH DIFFERENTIAL/PLATELET
Abs Immature Granulocytes: 0.03 10*3/uL (ref 0.00–0.07)
Basophils Absolute: 0 10*3/uL (ref 0.0–0.1)
Basophils Relative: 0 %
Eosinophils Absolute: 0 10*3/uL (ref 0.0–0.5)
Eosinophils Relative: 0 %
HCT: 41.5 % (ref 36.0–46.0)
Hemoglobin: 13.3 g/dL (ref 12.0–15.0)
Immature Granulocytes: 0 %
Lymphocytes Relative: 29 %
Lymphs Abs: 2.8 10*3/uL (ref 0.7–4.0)
MCH: 26.3 pg (ref 26.0–34.0)
MCHC: 32 g/dL (ref 30.0–36.0)
MCV: 82.2 fL (ref 80.0–100.0)
Monocytes Absolute: 0.6 10*3/uL (ref 0.1–1.0)
Monocytes Relative: 6 %
Neutro Abs: 6.1 10*3/uL (ref 1.7–7.7)
Neutrophils Relative %: 65 %
Platelets: 370 10*3/uL (ref 150–400)
RBC: 5.05 MIL/uL (ref 3.87–5.11)
RDW: 15.2 % (ref 11.5–15.5)
WBC: 9.5 10*3/uL (ref 4.0–10.5)
nRBC: 0 % (ref 0.0–0.2)

## 2023-02-24 LAB — COMPREHENSIVE METABOLIC PANEL
ALT: 21 U/L (ref 0–44)
AST: 21 U/L (ref 15–41)
Albumin: 4 g/dL (ref 3.5–5.0)
Alkaline Phosphatase: 110 U/L (ref 38–126)
Anion gap: 7 (ref 5–15)
BUN: 8 mg/dL (ref 6–20)
CO2: 26 mmol/L (ref 22–32)
Calcium: 8.6 mg/dL — ABNORMAL LOW (ref 8.9–10.3)
Chloride: 103 mmol/L (ref 98–111)
Creatinine, Ser: 0.62 mg/dL (ref 0.44–1.00)
GFR, Estimated: >60 mL/min (ref >60)
Glucose, Bld: 153 mg/dL — ABNORMAL HIGH (ref 70–99)
Potassium: 3.8 mmol/L (ref 3.5–5.1)
Sodium: 136 mmol/L (ref 135–145)
Total Bilirubin: 0.4 mg/dL (ref 0.3–1.2)
Total Protein: 7.7 g/dL (ref 6.5–8.1)

## 2023-02-24 LAB — LIPASE, BLOOD: Lipase: 30 U/L (ref 11–51)

## 2023-02-24 MED ORDER — SODIUM CHLORIDE (PF) 0.9 % IJ SOLN
INTRAMUSCULAR | Status: AC
  Filled 2023-02-24: qty 50

## 2023-02-24 MED ORDER — SUCRALFATE 1 G PO TABS: 1.0000 g | ORAL_TABLET | Freq: Three times a day (TID) | ORAL | 0 refills | Status: DC

## 2023-02-24 MED ORDER — SODIUM CHLORIDE 0.9 % IV SOLN: INTRAVENOUS | Status: DC

## 2023-02-24 MED ORDER — ONDANSETRON HCL 4 MG/2ML IJ SOLN
4.0000 mg | Freq: Once | INTRAMUSCULAR | Status: AC
  Administered 2023-02-24: 4 mg via INTRAVENOUS
  Filled 2023-02-24: qty 2

## 2023-02-24 MED ORDER — MORPHINE SULFATE (PF) 4 MG/ML IV SOLN
4.0000 mg | Freq: Once | INTRAVENOUS | Status: AC
  Administered 2023-02-24: 4 mg via INTRAVENOUS
  Filled 2023-02-24: qty 1

## 2023-02-24 MED ORDER — IOHEXOL 300 MG/ML  SOLN
100.0000 mL | Freq: Once | INTRAMUSCULAR | Status: AC | PRN
  Administered 2023-02-24: 100 mL via INTRAVENOUS

## 2023-02-24 NOTE — Discharge Instructions (Signed)
As discussed, your evaluation today has been largely reassuring.  But, it is important that you monitor your condition carefully, and do not hesitate to return to the ED if you develop new, or concerning changes in your condition. ? ?Otherwise, please follow-up with your physician for appropriate ongoing care. ? ?

## 2023-02-24 NOTE — ED Provider Notes (Signed)
[place] EMERGENCY DEPARTMENT AT [facility] Provider Note   CSN: [id number] Arrival date & time: 02/24/23  1343     History  Chief Complaint  Patient presents with   Abdominal Pain    [name] is a 57 y.o. female.  HPI Patient with history of pancreatitis presents 2 days after most recent ED visit now with concern for abdominal pain.  Pain is diffuse, possibly worse in the midline upper abdomen.  There is associated nausea, anorexia. She notes that after pain became severe today she spoke with her physician and was encouraged to come to the emergency department.     Home Medications Prior to Admission medications   Medication Sig Start Date End Date Taking? Authorizing Provider  sucralfate (CARAFATE) 1 g tablet Take 1 tablet (1 g total) by mouth 4 (four) times daily -  with meals and at bedtime. 02/24/23  Yes Gerhard Munch, MD  acetaminophen (TYLENOL) 500 MG tablet Take 500 mg by mouth 2 (two) times daily as needed for moderate pain.    [provider]  albuterol (PROVENTIL) (2.5 MG/3ML) 0.083% nebulizer solution Take 3 mLs (2.5 mg total) by nebulization every 6 (six) hours as needed for wheezing or shortness of breath. 12/12/19   Cathie Hoops, Amy V, PA-C  albuterol (VENTOLIN HFA) 108 (90 Base) MCG/ACT inhaler Inhale 2 puffs into the lungs every 4 (four) hours as needed for wheezing. 06/07/22   Tomi Bamberger, PA-C  ALPRAZolam Prudy Feeler) 1 MG tablet Take 1 mg by mouth daily as needed for anxiety. 10/23/19   [provider]  amLODipine-olmesartan (AZOR) 5-40 MG tablet Take 1 tablet by mouth daily. 06/05/21   [provider]  aspirin EC 81 MG tablet Take 81 mg by mouth daily. Swallow whole.    [provider]  CREON 36000-114000 units CPEP capsule Take 36,000 Units by mouth daily. Patient not taking: Reported on 12/27/2021 10/16/21   [provider]  cyclobenzaprine (FLEXERIL) 10 MG tablet Take 1 tablet (10 mg total) by mouth  at bedtime for 10 days. 02/22/23 03/04/23  Nira Conn, MD  dicyclomine (BENTYL) 10 MG capsule Take 10 mg by mouth daily. 10/16/21   [provider]  escitalopram (LEXAPRO) 20 MG tablet Take 20 mg by mouth daily. 08/10/20   [provider]  fluticasone (FLONASE) 50 MCG/ACT nasal spray Place 1 spray into both nostrils daily as needed for allergies or rhinitis.    [provider]  ibuprofen (ADVIL) 800 MG tablet Take 1 tablet (800 mg total) by mouth every 8 (eight) hours as needed (pain). Patient not taking: Reported on 09/05/2022 12/27/21   Zenia Resides, MD  insulin aspart (NOVOLOG) 100 UNIT/ML FlexPen Inject 0-9 Units into the skin 3 (three) times daily with meals. CBG 70 - 120: 0 units ,CBG 121 - 150:1 unit  ,CBG 151 - 200:2 units, CBG 201 - 250:3 units,CBG 251 - 300:5 units,CBG 301 - 350: 7 units ,CBG 351 - 400:9 units,CBG > 400 call MD and obtain STAT lab verification 09/08/22   Burnadette Pop, MD  insulin degludec (TRESIBA) 100 UNIT/ML FlexTouch Pen Inject 30 Units into the skin daily. 09/08/22   Burnadette Pop, MD  Insulin Pen Needle 32G X 4 MM MISC 1 each by Does not apply route 3 (three) times daily. 09/08/22   Burnadette Pop, MD  metoCLOPramide (REGLAN) 10 MG tablet Take 1 tablet (10 mg total) by mouth every 8 (eight) hours as needed for nausea. 09/08/22  Burnadette Pop, MD  metoprolol succinate (TOPROL-XL) 50 MG 24 hr tablet Take 50 mg by mouth daily. 07/21/14   [provider]  nicotine (NICODERM CQ - DOSED IN MG/24 HOURS) 14 mg/24hr patch Place 1 patch (14 mg total) onto the skin daily. 09/09/22   Burnadette Pop, MD  polyethylene glycol (MIRALAX / GLYCOLAX) 17 g packet Take 17 g by mouth daily as needed. 09/08/22   Burnadette Pop, MD  simvastatin (ZOCOR) 20 MG tablet Take 20 mg by mouth daily. 08/22/20   [provider]  sulfamethoxazole-trimethoprim (BACTRIM DS) 800-160 MG tablet Take 1 tablet by mouth 2 (two) times daily. 01/03/23    Bing Neighbors, NP  VITAMIN D PO Take 1 tablet by mouth daily.    [provider]  promethazine (PHENERGAN) 25 MG tablet Take 1 tablet (25 mg total) by mouth every 6 (six) hours as needed for nausea or vomiting. Patient not taking: No sig reported 08/15/20 10/19/20  Wieters, Fran Lowes C, PA-C      Allergies    Patient has no known allergies.    Review of Systems   Review of Systems  All other systems reviewed and are negative.   Physical Exam Updated Vital Signs BP 137/78   Pulse 93   Temp 98.5 F (36.9 C) (Oral)   Resp 18   Ht 5\' 5"  (1.651 m)   Wt 106 kg   SpO2 93%   BMI 38.89 kg/m  Physical Exam Vitals and nursing note reviewed.  Constitutional:      General: She is not in acute distress.    Appearance: She is well-developed. She is obese.  HENT:     Head: Normocephalic and atraumatic.  Eyes:     Conjunctiva/sclera: Conjunctivae normal.  Pulmonary:     Effort: Pulmonary effort is normal. No respiratory distress.     Breath sounds: No stridor.  Abdominal:     General: There is no distension.     Tenderness: There is generalized abdominal tenderness.  Skin:    General: Skin is warm and dry.  Neurological:     Mental Status: She is alert and oriented to person, place, and time.     Cranial Nerves: No cranial nerve deficit.  Psychiatric:        Mood and Affect: Mood normal.     ED Results / Procedures / Treatments   Labs (all labs ordered are listed, but only abnormal results are displayed) Labs Reviewed  COMPREHENSIVE METABOLIC PANEL - Abnormal; Notable for the following components:      Result Value   Glucose, Bld 153 (*)    Calcium 8.6 (*)    All other components within normal limits  LIPASE, BLOOD  CBC WITH DIFFERENTIAL/PLATELET    EKG None  Radiology CT ABDOMEN PELVIS W CONTRAST  Result Date: 02/24/2023 CLINICAL DATA:  Diverticulitis, complication suspected EXAM: CT ABDOMEN AND PELVIS WITH CONTRAST TECHNIQUE: Multidetector CT imaging of  the abdomen and pelvis was performed using the standard protocol following bolus administration of intravenous contrast. RADIATION DOSE REDUCTION: This exam was performed according to the departmental dose-optimization program which includes automated exposure control, adjustment of the mA and/or kV according to patient size and/or use of iterative reconstruction technique. CONTRAST:  OMNIPAQUE IOHEXOL 300 MG/ML  SOLN COMPARISON:  CT scan abdomen and pelvis from 09/09/2022. FINDINGS: Lower chest: The lung bases are clear. No pleural effusion. The heart is normal in size. No pericardial effusion. Hepatobiliary: The liver is normal in size. Non-cirrhotic configuration.  No suspicious mass. These is mild diffuse hepatic steatosis. No intrahepatic or extrahepatic bile duct dilation. Gallbladder is surgically absent. Pancreas: Unremarkable. No pancreatic ductal dilatation or surrounding inflammatory changes. Spleen: Within normal limits. No focal lesion. Adrenals/Urinary Tract: There is a stable 1.5 x 1.7 cm left adrenal nodule exhibiting central punctate dystrophic calcifications. This is essentially unchanged since the prior study. There is also a smaller indeterminate nodule in the medial limb of left adrenal gland, unchanged. Unremarkable right adrenal gland. No suspicious renal mass. No hydronephrosis. No renal or ureteric calculi. Unremarkable urinary bladder. Stomach/Bowel: There is a small sliding hiatal hernia. No disproportionate dilation of the small or large bowel loops. No evidence of abnormal bowel wall thickening or inflammatory changes. The appendix is unremarkable. Vascular/Lymphatic: No ascites or pneumoperitoneum. No abdominal or pelvic lymphadenopathy, by size criteria. No aneurysmal dilation of the major abdominal arteries. There are mild peripheral atherosclerotic vascular calcifications of the aorta and its major branches. Reproductive: The uterus is surgically absent. No large adnexal mass.  Other: There are small supraumbilical left paramedian fat containing ventral hernia. Infraumbilical midline and left paramedian surgical scar noted. The soft tissues and abdominal wall are otherwise unremarkable. Musculoskeletal: No suspicious osseous lesions. There are mild multilevel degenerative changes in the visualized spine. L4-5 and L5-S1 intervertebral disc spacers noted. IMPRESSION: 1. No acute inflammatory process identified within the abdomen or pelvis. 2. Multiple other nonacute observations, as described above. Aortic Atherosclerosis (ICD10-I70.0). Electronically Signed   By: Jules Schick M.D.   On: 02/24/2023 15:49    Procedures Procedures    Medications Ordered in ED Medications  0.9 %  sodium chloride infusion ( Intravenous New Bag/Given 02/24/23 1424)  ondansetron (ZOFRAN) injection 4 mg (4 mg Intravenous Given 02/24/23 1425)  morphine (PF) 4 MG/ML injection 4 mg (4 mg Intravenous Given 02/24/23 1425)  iohexol (OMNIPAQUE) 300 MG/ML solution 100 mL (100 mLs Intravenous Contrast Given 02/24/23 1516)    ED Course/ Medical Decision Making/ A&P                             Medical Decision Making Obese adult female with prior episodes of gastritis, pancreatitis presents with abdominal pain, nausea, vomiting. Patient is awake, alert, has a tender, with a nonperitoneal abdomen.  Given her description of severe pain, history, CT scan, labs ordered. Patient received IV morphine, Zofran, fluids.   Amount and/or Complexity of Data Reviewed External Data Reviewed: notes.    Details: ED eval 2 days ago reassuring Labs: ordered. Decision-making details documented in ED Course. Radiology: ordered and independent interpretation performed. Decision-making details documented in ED Course.  Risk Prescription drug management. Decision regarding hospitalization. Diagnosis or treatment significantly limited by social determinants of health.   4:06 PM Patient awake, alert, in no distress  she is speaking clearly, has unremarkable vital signs.  CT reviewed, labs reviewed, no leukocytosis, no fever, no hypotension no evidence of bacteremia, sepsis.  CT without acute findings some suspicion for gastritis given her history. Patient will follow-up with primary care.        Final Clinical Impression(s) / ED Diagnoses Final diagnoses:  Generalized abdominal pain    Rx / DC Orders ED Discharge Orders          Ordered    sucralfate (CARAFATE) 1 g tablet  3 times daily with meals & bedtime       Note to Pharmacy: Take for one week   02/24/23 1606  Gerhard Munch, MD 02/24/23 1606

## 2023-02-24 NOTE — ED Triage Notes (Signed)
Patient has had epigastric abdominal pain since yesterday along with vomiting. She thinks she has pancreatitis.

## 2023-02-25 ENCOUNTER — Other Ambulatory Visit: Payer: Self-pay

## 2023-02-25 ENCOUNTER — Emergency Department (HOSPITAL_COMMUNITY)
Admission: EM | Admit: 2023-02-25 | Discharge: 2023-02-26 | Disposition: A | Discharge status: Home / Self Care | Payer: Medicare HMO | Attending: Emergency Medicine | Admitting: Emergency Medicine

## 2023-02-25 DIAGNOSIS — R1110 Vomiting, unspecified: Secondary | ICD-10-CM | POA: Diagnosis present

## 2023-02-25 DIAGNOSIS — Z7982 Long term (current) use of aspirin: Secondary | ICD-10-CM | POA: Insufficient documentation

## 2023-02-25 DIAGNOSIS — Z794 Long term (current) use of insulin: Secondary | ICD-10-CM | POA: Diagnosis not present

## 2023-02-25 DIAGNOSIS — R1115 Cyclical vomiting syndrome unrelated to migraine: Secondary | ICD-10-CM | POA: Insufficient documentation

## 2023-02-25 DIAGNOSIS — I10 Essential (primary) hypertension: Secondary | ICD-10-CM | POA: Diagnosis not present

## 2023-02-25 DIAGNOSIS — R1013 Epigastric pain: Secondary | ICD-10-CM | POA: Insufficient documentation

## 2023-02-25 LAB — COMPREHENSIVE METABOLIC PANEL
ALT: 23 U/L (ref 0–44)
AST: 28 U/L (ref 15–41)
Albumin: 4.1 g/dL (ref 3.5–5.0)
Alkaline Phosphatase: 111 U/L (ref 38–126)
Anion gap: 8 (ref 5–15)
BUN: 9 mg/dL (ref 6–20)
CO2: 25 mmol/L (ref 22–32)
Calcium: 8.7 mg/dL — ABNORMAL LOW (ref 8.9–10.3)
Chloride: 104 mmol/L (ref 98–111)
Creatinine, Ser: 0.67 mg/dL (ref 0.44–1.00)
GFR, Estimated: >60 mL/min (ref >60)
Glucose, Bld: 110 mg/dL — ABNORMAL HIGH (ref 70–99)
Potassium: 3.7 mmol/L (ref 3.5–5.1)
Sodium: 137 mmol/L (ref 135–145)
Total Bilirubin: 0.5 mg/dL (ref 0.3–1.2)
Total Protein: 7.7 g/dL (ref 6.5–8.1)

## 2023-02-25 LAB — MAGNESIUM: Magnesium: 2.1 mg/dL (ref 1.7–2.4)

## 2023-02-25 LAB — CBC
HCT: 43.1 % (ref 36.0–46.0)
Hemoglobin: 13.8 g/dL (ref 12.0–15.0)
MCH: 26.8 pg (ref 26.0–34.0)
MCHC: 32 g/dL (ref 30.0–36.0)
MCV: 83.7 fL (ref 80.0–100.0)
Platelets: 393 10*3/uL (ref 150–400)
RBC: 5.15 MIL/uL — ABNORMAL HIGH (ref 3.87–5.11)
RDW: 15.1 % (ref 11.5–15.5)
WBC: 10.8 10*3/uL — ABNORMAL HIGH (ref 4.0–10.5)
nRBC: 0 % (ref 0.0–0.2)

## 2023-02-25 LAB — LIPASE, BLOOD: Lipase: 36 U/L (ref 11–51)

## 2023-02-25 MED ORDER — FAMOTIDINE IN NACL 20-0.9 MG/50ML-% IV SOLN
20.0000 mg | Freq: Once | INTRAVENOUS | Status: AC
  Administered 2023-02-25: 20 mg via INTRAVENOUS
  Filled 2023-02-25: qty 50

## 2023-02-25 MED ORDER — SODIUM CHLORIDE 0.9 % IV BOLUS
1000.0000 mL | Freq: Once | INTRAVENOUS | Status: AC
  Administered 2023-02-25: 1000 mL via INTRAVENOUS

## 2023-02-25 MED ORDER — DROPERIDOL 2.5 MG/ML IJ SOLN
2.5000 mg | Freq: Once | INTRAMUSCULAR | Status: AC
  Administered 2023-02-25: 2.5 mg via INTRAVENOUS
  Filled 2023-02-25: qty 2

## 2023-02-25 MED ORDER — FENTANYL CITRATE PF 50 MCG/ML IJ SOSY
25.0000 ug | PREFILLED_SYRINGE | Freq: Once | INTRAMUSCULAR | Status: AC | PRN
  Administered 2023-02-26: 25 ug via INTRAVENOUS
  Filled 2023-02-25: qty 1

## 2023-02-25 NOTE — ED Triage Notes (Signed)
Pt reports epigastric pain and vomiting x 5 days

## 2023-02-25 NOTE — ED Provider Notes (Signed)
[place] EMERGENCY DEPARTMENT AT [facility] Provider Note   CSN: [id number] Arrival date & time: 02/25/23  1837     History  Chief Complaint  Patient presents with   Abdominal Pain    [name] is a 57 y.o. female presented to ED with epigastric pain.  The patient reports that she began having abdominal pain about a week ago.  She was seen in the emergency department yesterday and had a workup including CT imaging of the abdomen, with no emergent findings.  It was suspected that she was experiencing gastroparesis or gastritis and she was discharged with Carafate.  She reports her pain did not improve after going home.  She reports still significant epigastric burning pain.  She vomits when trying to eat.  He says she has had these issues in the past and is sometimes "take days to get over".  She does report that she smokes marijuana often, though she says she is "not a heavy smoker".  She denies diarrhea.  HPI     Home Medications Prior to Admission medications   Medication Sig Start Date End Date Taking? Authorizing Provider  acetaminophen (TYLENOL) 500 MG tablet Take 500 mg by mouth 2 (two) times daily as needed for moderate pain.    [provider]  albuterol (PROVENTIL) (2.5 MG/3ML) 0.083% nebulizer solution Take 3 mLs (2.5 mg total) by nebulization every 6 (six) hours as needed for wheezing or shortness of breath. 12/12/19   Cathie Hoops, Amy V, PA-C  albuterol (VENTOLIN HFA) 108 (90 Base) MCG/ACT inhaler Inhale 2 puffs into the lungs every 4 (four) hours as needed for wheezing. 06/07/22   Tomi Bamberger, PA-C  ALPRAZolam Prudy Feeler) 1 MG tablet Take 1 mg by mouth daily as needed for anxiety. 10/23/19   [provider]  amLODipine-olmesartan (AZOR) 5-40 MG tablet Take 1 tablet by mouth daily. 06/05/21   [provider]  aspirin EC 81 MG tablet Take 81 mg by mouth daily. Swallow whole.    [provider]  CREON 36000-114000 units CPEP  capsule Take 36,000 Units by mouth daily. Patient not taking: Reported on 12/27/2021 10/16/21   [provider]  cyclobenzaprine (FLEXERIL) 10 MG tablet Take 1 tablet (10 mg total) by mouth at bedtime for 10 days. 02/22/23 03/04/23  Nira Conn, MD  dicyclomine (BENTYL) 10 MG capsule Take 10 mg by mouth daily. 10/16/21   [provider]  escitalopram (LEXAPRO) 20 MG tablet Take 20 mg by mouth daily. 08/10/20   [provider]  fluticasone (FLONASE) 50 MCG/ACT nasal spray Place 1 spray into both nostrils daily as needed for allergies or rhinitis.    [provider]  ibuprofen (ADVIL) 800 MG tablet Take 1 tablet (800 mg total) by mouth every 8 (eight) hours as needed (pain). Patient not taking: Reported on 09/05/2022 12/27/21   Zenia Resides, MD  insulin aspart (NOVOLOG) 100 UNIT/ML FlexPen Inject 0-9 Units into the skin 3 (three) times daily with meals. CBG 70 - 120: 0 units ,CBG 121 - 150:1 unit  ,CBG 151 - 200:2 units, CBG 201 - 250:3 units,CBG 251 - 300:5 units,CBG 301 - 350: 7 units ,CBG 351 - 400:9 units,CBG > 400 call MD and obtain STAT lab verification 09/08/22   Burnadette Pop, MD  insulin degludec (TRESIBA) 100 UNIT/ML FlexTouch Pen Inject 30 Units into the skin daily. 09/08/22   Burnadette Pop, MD  Insulin Pen Needle 32G X 4 MM MISC 1 each by Does  not apply route 3 (three) times daily. 09/08/22   Burnadette Pop, MD  metoCLOPramide (REGLAN) 10 MG tablet Take 1 tablet (10 mg total) by mouth every 8 (eight) hours as needed for nausea. 09/08/22   Burnadette Pop, MD  metoprolol succinate (TOPROL-XL) 50 MG 24 hr tablet Take 50 mg by mouth daily. 07/21/14   [provider]  nicotine (NICODERM CQ - DOSED IN MG/24 HOURS) 14 mg/24hr patch Place 1 patch (14 mg total) onto the skin daily. 09/09/22   Burnadette Pop, MD  polyethylene glycol (MIRALAX / GLYCOLAX) 17 g packet Take 17 g by mouth daily as needed. 09/08/22   Burnadette Pop, MD  simvastatin  (ZOCOR) 20 MG tablet Take 20 mg by mouth daily. 08/22/20   [provider]  sucralfate (CARAFATE) 1 g tablet Take 1 tablet (1 g total) by mouth 4 (four) times daily -  with meals and at bedtime. 02/24/23   Gerhard Munch, MD  sulfamethoxazole-trimethoprim (BACTRIM DS) 800-160 MG tablet Take 1 tablet by mouth 2 (two) times daily. 01/03/23   Bing Neighbors, NP  VITAMIN D PO Take 1 tablet by mouth daily.    [provider]  promethazine (PHENERGAN) 25 MG tablet Take 1 tablet (25 mg total) by mouth every 6 (six) hours as needed for nausea or vomiting. Patient not taking: No sig reported 08/15/20 10/19/20  Wieters, Fran Lowes C, PA-C      Allergies    Patient has no known allergies.    Review of Systems   Review of Systems  Physical Exam Updated Vital Signs BP 120/71 (BP Location: Left Arm)   Pulse 93   Temp 98.7 F (37.1 C) (Oral)   Resp 14   Ht 5\' 5"  (1.651 m)   Wt 106 kg   SpO2 93%   BMI 38.89 kg/m  Physical Exam Constitutional:      General: She is not in acute distress. HENT:     Head: Normocephalic and atraumatic.  Eyes:     Conjunctiva/sclera: Conjunctivae normal.     Pupils: Pupils are equal, round, and reactive to light.  Cardiovascular:     Rate and Rhythm: Normal rate and regular rhythm.  Pulmonary:     Effort: Pulmonary effort is normal. No respiratory distress.  Abdominal:     General: There is no distension.     Tenderness: There is abdominal tenderness in the epigastric area.  Skin:    General: Skin is warm and dry.  Neurological:     General: No focal deficit present.     Mental Status: She is alert. Mental status is at baseline.  Psychiatric:        Mood and Affect: Mood normal.        Behavior: Behavior normal.     ED Results / Procedures / Treatments   Labs (all labs ordered are listed, but only abnormal results are displayed) Labs Reviewed  COMPREHENSIVE METABOLIC PANEL - Abnormal; Notable for the following components:      Result  Value   Glucose, Bld 110 (*)    Calcium 8.7 (*)    All other components within normal limits  CBC - Abnormal; Notable for the following components:   WBC 10.8 (*)    RBC 5.15 (*)    All other components within normal limits  LIPASE, BLOOD  MAGNESIUM    EKG EKG Interpretation Date/Time:  Tuesday February 25 2023 22:50:09 EDT Ventricular Rate:  95 PR Interval:  184 QRS Duration:  101 QT Interval:  385 QTC Calculation: 484 R Axis:   20  Text Interpretation: Sinus rhythm Probable left atrial enlargement Anteroseptal infarct, age indeterminate Confirmed by Alvester Chou [id number]) on 02/25/2023 11:14:32 PM  Radiology CT ABDOMEN PELVIS W CONTRAST  Result Date: 02/24/2023 CLINICAL DATA:  Diverticulitis, complication suspected EXAM: CT ABDOMEN AND PELVIS WITH CONTRAST TECHNIQUE: Multidetector CT imaging of the abdomen and pelvis was performed using the standard protocol following bolus administration of intravenous contrast. RADIATION DOSE REDUCTION: This exam was performed according to the departmental dose-optimization program which includes automated exposure control, adjustment of the mA and/or kV according to patient size and/or use of iterative reconstruction technique. CONTRAST:  OMNIPAQUE IOHEXOL 300 MG/ML  SOLN COMPARISON:  CT scan abdomen and pelvis from 09/09/2022. FINDINGS: Lower chest: The lung bases are clear. No pleural effusion. The heart is normal in size. No pericardial effusion. Hepatobiliary: The liver is normal in size. Non-cirrhotic configuration. No suspicious mass. These is mild diffuse hepatic steatosis. No intrahepatic or extrahepatic bile duct dilation. Gallbladder is surgically absent. Pancreas: Unremarkable. No pancreatic ductal dilatation or surrounding inflammatory changes. Spleen: Within normal limits. No focal lesion. Adrenals/Urinary Tract: There is a stable 1.5 x 1.7 cm left adrenal nodule exhibiting central punctate dystrophic calcifications. This is essentially  unchanged since the prior study. There is also a smaller indeterminate nodule in the medial limb of left adrenal gland, unchanged. Unremarkable right adrenal gland. No suspicious renal mass. No hydronephrosis. No renal or ureteric calculi. Unremarkable urinary bladder. Stomach/Bowel: There is a small sliding hiatal hernia. No disproportionate dilation of the small or large bowel loops. No evidence of abnormal bowel wall thickening or inflammatory changes. The appendix is unremarkable. Vascular/Lymphatic: No ascites or pneumoperitoneum. No abdominal or pelvic lymphadenopathy, by size criteria. No aneurysmal dilation of the major abdominal arteries. There are mild peripheral atherosclerotic vascular calcifications of the aorta and its major branches. Reproductive: The uterus is surgically absent. No large adnexal mass. Other: There are small supraumbilical left paramedian fat containing ventral hernia. Infraumbilical midline and left paramedian surgical scar noted. The soft tissues and abdominal wall are otherwise unremarkable. Musculoskeletal: No suspicious osseous lesions. There are mild multilevel degenerative changes in the visualized spine. L4-5 and L5-S1 intervertebral disc spacers noted. IMPRESSION: 1. No acute inflammatory process identified within the abdomen or pelvis. 2. Multiple other nonacute observations, as described above. Aortic Atherosclerosis (ICD10-I70.0). Electronically Signed   By: Jules Schick M.D.   On: 02/24/2023 15:49    Procedures Procedures    Medications Ordered in ED Medications  droperidol (INAPSINE) 2.5 MG/ML injection 2.5 mg (2.5 mg Intravenous Given 02/25/23 2337)  famotidine (PEPCID) IVPB 20 mg premix (0 mg Intravenous Stopped 02/25/23 2355)  sodium chloride 0.9 % bolus 1,000 mL (0 mLs Intravenous Stopped 02/26/23 0140)  fentaNYL (SUBLIMAZE) injection 25 mcg (25 mcg Intravenous Given 02/26/23 0005)  ketorolac (TORADOL) 30 MG/ML injection 15 mg (15 mg Intravenous Given  02/26/23 0017)    ED Course/ Medical Decision Making/ A&P                             Medical Decision Making Amount and/or Complexity of Data Reviewed Labs: ordered. ECG/medicine tests: ordered.  Risk Prescription drug management.   This patient presents to the ED with concern for epigastric pain, nausea and vomiting. This involves an extensive number of treatment options, and is a complaint that carries with it a high risk of complications and morbidity.  The differential diagnosis includes  gastroparesis vs cyclical vomiting vs gastritis vs gastric ulcer vs other  Co-morbidities that complicate the patient evaluation: cannabis use at higher risk for hyperemesis syndrome I ordered and personally interpreted labs.  The pertinent results include:  no emergent findings; labs stable from yesterday  CT abdominal ED imaging reviewed from yesterday - no emergent finding to explain symptoms  The patient was maintained on a cardiac monitor.  I personally viewed and interpreted the cardiac monitored which showed an underlying rhythm of: NSR  Per my interpretation the patient's ECG shows sinus rhythm, Qtc wnl.  I ordered medication for suspected gastritis and gastroparesis  I have reviewed the patients home medicines and have made adjustments as needed  Test Considered: doubt PE, ovarian torsion; no indication for pelvic imaging or repeat CT imaging of the abdomen at this time.   After the interventions noted above, I reevaluated the patient and found that they have: stayed the same  Social Determinants of Health:counselled on stopping smoking marijuana as trigger for vomiting syndrome  Dispostion:  Patient signed out to Dr April Palumbro EDP at 12am pending reassessment after medications.         Final Clinical Impression(s) / ED Diagnoses Final diagnoses:  Cyclic vomiting syndrome    Rx / DC Orders ED Discharge Orders     None         Terald Sleeper,  MD 02/26/23 1422

## 2023-02-25 NOTE — ED Notes (Signed)
Labeled specimen cup given to pt for U/A collection per MD order. ENMiles 

## 2023-02-26 DIAGNOSIS — R1115 Cyclical vomiting syndrome unrelated to migraine: Secondary | ICD-10-CM | POA: Diagnosis not present

## 2023-02-26 MED ORDER — KETOROLAC TROMETHAMINE 30 MG/ML IJ SOLN
15.0000 mg | Freq: Once | INTRAMUSCULAR | Status: AC
  Administered 2023-02-26: 15 mg via INTRAVENOUS
  Filled 2023-02-26: qty 1

## 2023-02-26 NOTE — ED Notes (Signed)
Patient was discharged. Pt. Is yelling at nurse and doctor because she does not want to be discharged. Security and [employer] at bedside.

## 2023-02-26 NOTE — ED Provider Notes (Signed)
Patient PO challenged and pain score went from 10 to zero.  Patient was sleeping in room.  Discharge completed.    Informed by nurse patient refused discharge.  Wants to be admitted as "medication and making me better is only a bandaid". EDP explained when a patient is made completely better we plan discharge and have them follow up as an outpatient.    Patient became aggressive.  Patient is stable for discharge.     Lauren Meskill, MD 02/26/23 [phone]

## 2023-03-02 ENCOUNTER — Emergency Department (HOSPITAL_COMMUNITY)
Admission: EM | Admit: 2023-03-02 | Discharge: 2023-03-02 | Disposition: A | Discharge status: Home / Self Care | Payer: Medicare HMO | Attending: Emergency Medicine | Admitting: Emergency Medicine

## 2023-03-02 ENCOUNTER — Encounter (HOSPITAL_COMMUNITY): Payer: Self-pay | Admitting: Emergency Medicine

## 2023-03-02 ENCOUNTER — Other Ambulatory Visit: Payer: Self-pay

## 2023-03-02 DIAGNOSIS — I10 Essential (primary) hypertension: Secondary | ICD-10-CM | POA: Insufficient documentation

## 2023-03-02 DIAGNOSIS — E119 Type 2 diabetes mellitus without complications: Secondary | ICD-10-CM | POA: Diagnosis not present

## 2023-03-02 DIAGNOSIS — R1111 Vomiting without nausea: Secondary | ICD-10-CM | POA: Insufficient documentation

## 2023-03-02 LAB — COMPREHENSIVE METABOLIC PANEL
ALT: 22 U/L (ref 0–44)
AST: 23 U/L (ref 15–41)
Albumin: 4 g/dL (ref 3.5–5.0)
Alkaline Phosphatase: 124 U/L (ref 38–126)
Anion gap: 12 (ref 5–15)
BUN: 8 mg/dL (ref 6–20)
CO2: 25 mmol/L (ref 22–32)
Calcium: 9 mg/dL (ref 8.9–10.3)
Chloride: 99 mmol/L (ref 98–111)
Creatinine, Ser: 0.72 mg/dL (ref 0.44–1.00)
GFR, Estimated: >60 mL/min (ref >60)
Glucose, Bld: 263 mg/dL — ABNORMAL HIGH (ref 70–99)
Potassium: 3.9 mmol/L (ref 3.5–5.1)
Sodium: 136 mmol/L (ref 135–145)
Total Bilirubin: 0.5 mg/dL (ref 0.3–1.2)
Total Protein: 7.7 g/dL (ref 6.5–8.1)

## 2023-03-02 LAB — URINALYSIS, ROUTINE W REFLEX MICROSCOPIC
Bilirubin Urine: NEGATIVE
Glucose, UA: >=500 mg/dL — AB
Ketones, ur: 5 mg/dL — AB
Leukocytes,Ua: NEGATIVE
Nitrite: NEGATIVE
Protein, ur: NEGATIVE mg/dL
Specific Gravity, Urine: 1.016 (ref 1.005–1.030)
pH: 5 (ref 5.0–8.0)

## 2023-03-02 LAB — CBC
HCT: 44.6 % (ref 36.0–46.0)
Hemoglobin: 14.4 g/dL (ref 12.0–15.0)
MCH: 26.5 pg (ref 26.0–34.0)
MCHC: 32.3 g/dL (ref 30.0–36.0)
MCV: 82.1 fL (ref 80.0–100.0)
Platelets: 415 10*3/uL — ABNORMAL HIGH (ref 150–400)
RBC: 5.43 MIL/uL — ABNORMAL HIGH (ref 3.87–5.11)
RDW: 15.1 % (ref 11.5–15.5)
WBC: 10.1 10*3/uL (ref 4.0–10.5)
nRBC: 0 % (ref 0.0–0.2)

## 2023-03-02 LAB — LIPASE, BLOOD: Lipase: 47 U/L (ref 11–51)

## 2023-03-02 LAB — CBG MONITORING, ED: Glucose-Capillary: 255 mg/dL — ABNORMAL HIGH (ref 70–99)

## 2023-03-02 MED ORDER — SODIUM CHLORIDE 0.9 % IV BOLUS
1000.0000 mL | Freq: Once | INTRAVENOUS | Status: AC
  Administered 2023-03-02: 1000 mL via INTRAVENOUS

## 2023-03-02 MED ORDER — HALOPERIDOL LACTATE 5 MG/ML IJ SOLN
2.0000 mg | Freq: Once | INTRAMUSCULAR | Status: AC
  Administered 2023-03-02: 2 mg via INTRAVENOUS
  Filled 2023-03-02: qty 1

## 2023-03-02 MED ORDER — ONDANSETRON 4 MG PO TBDP: 4.0000 mg | ORAL_TABLET | Freq: Three times a day (TID) | ORAL | 0 refills | Status: DC | PRN

## 2023-03-02 NOTE — ED Triage Notes (Signed)
Pt reports abdominal pain and vomiting that began today and "I took it as long as I could"  Pt tearful at time of triage.

## 2023-03-02 NOTE — Discharge Instructions (Signed)
You were evaluated in the Emergency Department and after careful evaluation, we did not find any emergent condition requiring admission or further testing in the hospital.  Your exam/testing today is overall reassuring.  Use the Zofran as needed for nausea and follow-up with gastroenterology.  Please return to the Emergency Department if you experience any worsening of your condition.   Thank you for allowing [place] to be a part of your care.

## 2023-03-02 NOTE — ED Provider Notes (Signed)
[facility]-EMERGENCY DEPT [facility] Emergency Department Provider Note MRN:  [id number]  Arrival date & time: 03/02/23     Chief Complaint   Abdominal Pain and Emesis   History of Present Illness   [name] is a 57 y.o. year-old female with a history of fibromyalgia presenting to the ED with chief complaint of nausea vomiting.  Nausea vomiting and upper abdominal pain for the past 2 or 3 days, multiple prior episodes.  Has a follow-up with GI in December.  Has been cutting down on her marijuana use.  Review of Systems  A thorough review of systems was obtained and all systems are negative except as noted in the HPI and PMH.   Patient's Health History    Past Medical History:  Diagnosis Date   Anxiety    Arthritis    rheumatoid , osteoarthritis   Bronchitis    Chronic back pain    Depression    Diabetes mellitus    Fatty liver    Fibromyalgia    GERD (gastroesophageal reflux disease)    Headache    Hypertension    Pancreatitis    Pneumonia    Rotator cuff tear, right     Past Surgical History:  Procedure Laterality Date   ABDOMINAL HYSTERECTOMY     ANTERIOR CERVICAL DECOMP/DISCECTOMY FUSION N/A 07/21/2017   Procedure: ANTERIOR CERVICAL DISCECTOMY AND FUSION C6-7, REMOVAL OF SCREW C6 PLATE, DEPUY ZERO-P IMPLANT, LOCAL BONE GRAFT, ALLOGRAFT BONE GRAFT, VIVIGEN;  Surgeon: Kerrin Champagne, MD;  Location: [facility] OR;  Service: Orthopedics;  Laterality: N/A;   BACK SURGERY     laminectomy   BONE GRAFT HIP ILIAC CREST     BREAST BIOPSY     CARPAL TUNNEL RELEASE     CERVICAL DISCECTOMY  07/21/2017   CESAREAN SECTION     CHOLECYSTECTOMY     COLONOSCOPY     frontal fusion     neck fusion     patient reported   SHOULDER ARTHROSCOPY WITH SUBACROMIAL DECOMPRESSION, ROTATOR CUFF REPAIR AND BICEP TENDON REPAIR Right 10/31/2017   Procedure: RIGHT SHOULDER ARTHROSCOPY, MANIPULATION UNDER ANESTHESIA, BICEPS TENODESIS, AND MINI OPEN ROTATOR CUFF TEAR REPAIR;  Surgeon: Cammy Copa, MD;  Location: [facility] OR;  Service: Orthopedics;  Laterality: Right;    Family History  Problem Relation Age of Onset   Hypertension Father    Renal Disease Father    Diabetes Mother    Hypertension Mother    Edema Mother    Diabetes Sister    Diabetes Brother     Social History   Socioeconomic History   Marital status: Legally Separated    Spouse name: Not on file   Number of children: Not on file   Years of education: Not on file   Highest education level: Not on file  Occupational History   Occupation: disabled  Tobacco Use   Smoking status: Every Day    Current packs/day: 0.50    Average packs/day: 0.5 packs/day for 40.0 years (20.0 ttl pk-yrs)    Types: Cigarettes   Smokeless tobacco: Never  Vaping Use   Vaping status: Never Used  Substance and Sexual Activity   Alcohol use: No   Drug use: Yes    Types: Marijuana    Comment: 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 so stopped smoking THC, last use maybe 6 weeks ago   Sexual activity: Not on file  Other Topics Concern   Not on file  Social History Narrative   Not  on file   Social Determinants of Health   Financial Resource Strain: Not on file  Food Insecurity: No Food Insecurity (09/05/2022)   Hunger Vital Sign    Worried About Running Out of Food in the Last Year: Never true    Ran Out of Food in the Last Year: Never true  Transportation Needs: No Transportation Needs (09/05/2022)   PRAPARE - Administrator, Civil Service (Medical): No    Lack of Transportation (Non-Medical): No  Physical Activity: Not on file  Stress: Not on file  Social Connections: Not on file  Intimate Partner Violence: Not At Risk (09/05/2022)   Humiliation, Afraid, Rape, and Kick questionnaire    Fear of Current or Ex-Partner: No    Emotionally Abused: No    Physically Abused: No    Sexually Abused: No     Physical Exam   Vitals:   03/02/23 0540 03/02/23 0605  BP:  (!) 151/99  Pulse:  (!) 125   Resp:  (!) 24  Temp: 98.2 F (36.8 C)   SpO2:  96%    CONSTITUTIONAL: Well-appearing, tearful due to discomfort NEURO/PSYCH:  Alert and oriented x 3, no focal deficits EYES:  eyes equal and reactive ENT/NECK:  no LAD, no JVD CARDIO: Tachycardic rate, well-perfused, normal S1 and S2 PULM:  CTAB no wheezing or rhonchi GI/GU:  non-distended, non-tender MSK/SPINE:  No gross deformities, no edema SKIN:  no rash, atraumatic   *Additional and/or pertinent findings included in MDM below  Diagnostic and Interventional Summary    EKG Interpretation Date/Time:    Ventricular Rate:    PR Interval:    QRS Duration:    QT Interval:    QTC Calculation:   R Axis:      Text Interpretation:         Labs Reviewed  COMPREHENSIVE METABOLIC PANEL - Abnormal; Notable for the following components:      Result Value   Glucose, Bld 263 (*)    All other components within normal limits  CBC - Abnormal; Notable for the following components:   RBC 5.43 (*)    Platelets 415 (*)    All other components within normal limits  URINALYSIS, ROUTINE W REFLEX MICROSCOPIC - Abnormal; Notable for the following components:   Glucose, UA >=500 (*)    Hgb urine dipstick SMALL (*)    Ketones, ur 5 (*)    Bacteria, UA RARE (*)    All other components within normal limits  CBG MONITORING, ED - Abnormal; Notable for the following components:   Glucose-Capillary 255 (*)    All other components within normal limits  LIPASE, BLOOD    No orders to display    Medications  haloperidol lactate (HALDOL) injection 2 mg (2 mg Intravenous Given 03/02/23 0205)  sodium chloride 0.9 % bolus 1,000 mL (0 mLs Intravenous Stopped 03/02/23 0324)     Procedures  /  Critical Care Procedures  ED Course and Medical Decision Making  Initial Impression and Ddx Multiple evaluations for vomiting of unclear cause, cyclical vomiting or functional disorder history.  Also has a history of pancreatitis, chronic pain.  Abdomen is  largely soft and nontender.  Overall doubt any acute surgical emergency.  Past medical/surgical history that increases complexity of ED encounter: Cyclical vomiting, chronic pain  Interpretation of Diagnostics I personally reviewed the laboratory assessment and my interpretation is as follows: No significant blood count or electrolyte disturbance    Patient Reassessment and Ultimate Disposition/Management  Patient with great response with Haldol and fluids, has been comfortable for a few hours and tolerating p.o., appropriate for discharge.  Patient management required discussion with the following services or consulting groups:  None  Complexity of Problems Addressed Acute illness or injury that poses threat of life of bodily function  Additional Data Reviewed and Analyzed Further history obtained from: Prior ED visit notes and Prior labs/imaging results  Additional Factors Impacting ED Encounter Risk Prescriptions  Elmer Sow. Pilar Plate, MD [facility] Emergency Medicine [facility] [email or web address]  Final Clinical Impressions(s) / ED Diagnoses     ICD-10-CM   1. Vomiting without nausea, unspecified vomiting type  R11.11       ED Discharge Orders          Ordered    ondansetron (ZOFRAN-ODT) 4 MG disintegrating tablet  Every 8 hours PRN        03/02/23 0616             Discharge Instructions Discussed with and Provided to Patient:    Discharge Instructions      You were evaluated in the Emergency Department and after careful evaluation, we did not find any emergent condition requiring admission or further testing in the hospital.  Your exam/testing today is overall reassuring.  Use the Zofran as needed for nausea and follow-up with gastroenterology.  Please return to the Emergency Department if you experience any worsening of your condition.   Thank you for allowing [place] to be a part of your care.      Sabas Sous, MD 03/02/23  [phone]

## 2023-03-02 NOTE — ED Notes (Signed)
The pt reports that she has had abd pain for one week  and today glucose was over 100 then dropped to 55

## 2023-03-03 DIAGNOSIS — E1140 Type 2 diabetes mellitus with diabetic neuropathy, unspecified: Secondary | ICD-10-CM | POA: Diagnosis not present

## 2023-03-03 DIAGNOSIS — R1084 Generalized abdominal pain: Secondary | ICD-10-CM | POA: Diagnosis not present

## 2023-03-13 ENCOUNTER — Other Ambulatory Visit: Payer: Self-pay | Admitting: Internal Medicine

## 2023-05-15 ENCOUNTER — Encounter (HOSPITAL_COMMUNITY): Payer: Self-pay | Admitting: *Deleted

## 2023-05-15 ENCOUNTER — Emergency Department (HOSPITAL_COMMUNITY)
Admission: EM | Admit: 2023-05-15 | Discharge: 2023-05-15 | Disposition: A | Discharge status: Home / Self Care | Payer: 59 | Attending: Emergency Medicine | Admitting: Emergency Medicine

## 2023-05-15 ENCOUNTER — Other Ambulatory Visit: Payer: Self-pay

## 2023-05-15 ENCOUNTER — Emergency Department (HOSPITAL_COMMUNITY): Payer: 59

## 2023-05-15 DIAGNOSIS — E119 Type 2 diabetes mellitus without complications: Secondary | ICD-10-CM | POA: Insufficient documentation

## 2023-05-15 DIAGNOSIS — Z7982 Long term (current) use of aspirin: Secondary | ICD-10-CM | POA: Insufficient documentation

## 2023-05-15 DIAGNOSIS — R112 Nausea with vomiting, unspecified: Secondary | ICD-10-CM | POA: Insufficient documentation

## 2023-05-15 DIAGNOSIS — R1010 Upper abdominal pain, unspecified: Secondary | ICD-10-CM

## 2023-05-15 DIAGNOSIS — R1013 Epigastric pain: Secondary | ICD-10-CM | POA: Diagnosis not present

## 2023-05-15 DIAGNOSIS — Z794 Long term (current) use of insulin: Secondary | ICD-10-CM | POA: Insufficient documentation

## 2023-05-15 DIAGNOSIS — Z79899 Other long term (current) drug therapy: Secondary | ICD-10-CM | POA: Insufficient documentation

## 2023-05-15 DIAGNOSIS — F17200 Nicotine dependence, unspecified, uncomplicated: Secondary | ICD-10-CM | POA: Diagnosis not present

## 2023-05-15 DIAGNOSIS — I10 Essential (primary) hypertension: Secondary | ICD-10-CM | POA: Insufficient documentation

## 2023-05-15 LAB — URINALYSIS, ROUTINE W REFLEX MICROSCOPIC
Bacteria, UA: NONE SEEN
Bilirubin Urine: NEGATIVE
Glucose, UA: >=500 mg/dL — AB
Hgb urine dipstick: NEGATIVE
Ketones, ur: 5 mg/dL — AB
Leukocytes,Ua: NEGATIVE
Nitrite: NEGATIVE
Protein, ur: NEGATIVE mg/dL
Specific Gravity, Urine: 1.033 — ABNORMAL HIGH (ref 1.005–1.030)
pH: 7 (ref 5.0–8.0)

## 2023-05-15 LAB — COMPREHENSIVE METABOLIC PANEL
ALT: 20 U/L (ref 0–44)
AST: 23 U/L (ref 15–41)
Albumin: 4.3 g/dL (ref 3.5–5.0)
Alkaline Phosphatase: 116 U/L (ref 38–126)
Anion gap: 12 (ref 5–15)
BUN: 11 mg/dL (ref 6–20)
CO2: 24 mmol/L (ref 22–32)
Calcium: 9 mg/dL (ref 8.9–10.3)
Chloride: 100 mmol/L (ref 98–111)
Creatinine, Ser: 0.67 mg/dL (ref 0.44–1.00)
GFR, Estimated: >60 mL/min (ref >60)
Glucose, Bld: 170 mg/dL — ABNORMAL HIGH (ref 70–99)
Potassium: 3.9 mmol/L (ref 3.5–5.1)
Sodium: 136 mmol/L (ref 135–145)
Total Bilirubin: 0.4 mg/dL (ref 0.3–1.2)
Total Protein: 7.9 g/dL (ref 6.5–8.1)

## 2023-05-15 LAB — CBC
HCT: 44.5 % (ref 36.0–46.0)
Hemoglobin: 13.9 g/dL (ref 12.0–15.0)
MCH: 26.3 pg (ref 26.0–34.0)
MCHC: 31.2 g/dL (ref 30.0–36.0)
MCV: 84.1 fL (ref 80.0–100.0)
Platelets: 408 10*3/uL — ABNORMAL HIGH (ref 150–400)
RBC: 5.29 MIL/uL — ABNORMAL HIGH (ref 3.87–5.11)
RDW: 15.6 % — ABNORMAL HIGH (ref 11.5–15.5)
WBC: 10 10*3/uL (ref 4.0–10.5)
nRBC: 0 % (ref 0.0–0.2)

## 2023-05-15 LAB — LIPASE, BLOOD: Lipase: 36 U/L (ref 11–51)

## 2023-05-15 LAB — CBG MONITORING, ED: Glucose-Capillary: 122 mg/dL — ABNORMAL HIGH (ref 70–99)

## 2023-05-15 MED ORDER — SODIUM CHLORIDE (PF) 0.9 % IJ SOLN
INTRAMUSCULAR | Status: AC
  Filled 2023-05-15: qty 50

## 2023-05-15 MED ORDER — SODIUM CHLORIDE 0.9 % IV BOLUS
1000.0000 mL | Freq: Once | INTRAVENOUS | Status: AC
  Administered 2023-05-15: 1000 mL via INTRAVENOUS

## 2023-05-15 MED ORDER — IOHEXOL 300 MG/ML  SOLN
100.0000 mL | Freq: Once | INTRAMUSCULAR | Status: AC | PRN
  Administered 2023-05-15: 100 mL via INTRAVENOUS

## 2023-05-15 MED ORDER — METOCLOPRAMIDE HCL 5 MG/ML IJ SOLN
10.0000 mg | Freq: Once | INTRAMUSCULAR | Status: AC
  Administered 2023-05-15: 10 mg via INTRAVENOUS
  Filled 2023-05-15: qty 2

## 2023-05-15 MED ORDER — KETOROLAC TROMETHAMINE 30 MG/ML IJ SOLN
30.0000 mg | Freq: Once | INTRAMUSCULAR | Status: AC
  Administered 2023-05-15: 30 mg via INTRAVENOUS
  Filled 2023-05-15: qty 1

## 2023-05-15 MED ORDER — MORPHINE SULFATE (PF) 4 MG/ML IV SOLN
4.0000 mg | Freq: Once | INTRAVENOUS | Status: AC
  Administered 2023-05-15: 4 mg via INTRAVENOUS
  Filled 2023-05-15: qty 1

## 2023-05-15 MED ORDER — FENTANYL CITRATE PF 50 MCG/ML IJ SOSY
50.0000 ug | PREFILLED_SYRINGE | Freq: Once | INTRAMUSCULAR | Status: AC
  Administered 2023-05-15: 50 ug via INTRAVENOUS
  Filled 2023-05-15: qty 1

## 2023-05-15 MED ORDER — ONDANSETRON HCL 4 MG/2ML IJ SOLN
4.0000 mg | Freq: Once | INTRAMUSCULAR | Status: AC
  Administered 2023-05-15: 4 mg via INTRAVENOUS
  Filled 2023-05-15: qty 2

## 2023-05-15 NOTE — ED Triage Notes (Signed)
Pt states her doctor told her she has Pancreatitis, this morning since 4 she has been hurting and vomiting, hot and cold flashes, pain located in epigastric area

## 2023-05-15 NOTE — ED Notes (Signed)
Attempted IV access with no success waiting on 2nd person to attempt

## 2023-05-15 NOTE — ED Provider Notes (Signed)
[place] EMERGENCY DEPARTMENT AT [facility] Provider Note   CSN: [id number] Arrival date & time: 05/15/23  1238     History  Chief Complaint  Patient presents with   Abdominal Pain   Emesis   Nausea    [name] is a 57 y.o. female with history of diabetes, tobacco use, hypertension, marijuana use, recurrent nausea and vomiting, chronic pancreatitis, who presents emergency department complaining of abdominal pain, nausea and vomiting, hot and cold flashes starting at about 4 AM this morning.  Localizes pain to her epigastrium.   Abdominal Pain Associated symptoms: nausea and vomiting   Emesis Associated symptoms: abdominal pain        Home Medications Prior to Admission medications   Medication Sig Start Date End Date Taking? Authorizing Provider  acetaminophen (TYLENOL) 500 MG tablet Take 500 mg by mouth 2 (two) times daily as needed for moderate pain.    [provider]  albuterol (PROVENTIL) (2.5 MG/3ML) 0.083% nebulizer solution Take 3 mLs (2.5 mg total) by nebulization every 6 (six) hours as needed for wheezing or shortness of breath. 12/12/19   Cathie Hoops, Amy V, PA-C  albuterol (VENTOLIN HFA) 108 (90 Base) MCG/ACT inhaler Inhale 2 puffs into the lungs every 4 (four) hours as needed for wheezing. 06/07/22   Tomi Bamberger, PA-C  ALPRAZolam Prudy Feeler) 1 MG tablet Take 1 mg by mouth daily as needed for anxiety. 10/23/19   [provider]  amLODipine-olmesartan (AZOR) 5-40 MG tablet Take 1 tablet by mouth daily. 06/05/21   [provider]  aspirin EC 81 MG tablet Take 81 mg by mouth daily. Swallow whole.    [provider]  CREON 36000-114000 units CPEP capsule Take 36,000 Units by mouth daily. Patient not taking: Reported on 12/27/2021 10/16/21   [provider]  dicyclomine (BENTYL) 10 MG capsule Take 10 mg by mouth daily. 10/16/21   [provider]  escitalopram (LEXAPRO) 20 MG tablet Take 20 mg by mouth  daily. 08/10/20   [provider]  fluticasone (FLONASE) 50 MCG/ACT nasal spray Place 1 spray into both nostrils daily as needed for allergies or rhinitis.    [provider]  ibuprofen (ADVIL) 800 MG tablet Take 1 tablet (800 mg total) by mouth every 8 (eight) hours as needed (pain). Patient not taking: Reported on 09/05/2022 12/27/21   Zenia Resides, MD  insulin aspart (NOVOLOG) 100 UNIT/ML FlexPen Inject 0-9 Units into the skin 3 (three) times daily with meals. CBG 70 - 120: 0 units ,CBG 121 - 150:1 unit  ,CBG 151 - 200:2 units, CBG 201 - 250:3 units,CBG 251 - 300:5 units,CBG 301 - 350: 7 units ,CBG 351 - 400:9 units,CBG > 400 call MD and obtain STAT lab verification 09/08/22   Burnadette Pop, MD  insulin degludec (TRESIBA) 100 UNIT/ML FlexTouch Pen Inject 30 Units into the skin daily. 09/08/22   Burnadette Pop, MD  Insulin Pen Needle 32G X 4 MM MISC 1 each by Does not apply route 3 (three) times daily. 09/08/22   Burnadette Pop, MD  metoCLOPramide (REGLAN) 10 MG tablet Take 1 tablet (10 mg total) by mouth every 8 (eight) hours as needed for nausea. 09/08/22   Burnadette Pop, MD  metoprolol succinate (TOPROL-XL) 50 MG 24 hr tablet Take 50 mg by mouth daily. 07/21/14   [provider]  nicotine (NICODERM CQ - DOSED IN MG/24 HOURS) 14 mg/24hr patch Place 1 patch (14 mg total) onto the skin daily. 09/09/22  Burnadette Pop, MD  ondansetron (ZOFRAN-ODT) 4 MG disintegrating tablet Take 1 tablet (4 mg total) by mouth every 8 (eight) hours as needed for nausea or vomiting. 03/02/23   Sabas Sous, MD  oxyCODONE-acetaminophen (PERCOCET/ROXICET) 5-325 MG tablet Take 1 tablet by mouth every 4 (four) hours as needed for severe pain. 05/17/23   Tegeler, Canary Brim, MD  polyethylene glycol (MIRALAX / GLYCOLAX) 17 g packet Take 17 g by mouth daily as needed. 09/08/22   Burnadette Pop, MD  promethazine (PHENERGAN) 25 MG tablet Take 1 tablet (25 mg total) by mouth every 6 (six)  hours as needed for nausea or vomiting. 05/17/23   Tegeler, Canary Brim, MD  simvastatin (ZOCOR) 20 MG tablet Take 20 mg by mouth daily. 08/22/20   [provider]  sucralfate (CARAFATE) 1 g tablet Take 1 tablet (1 g total) by mouth 4 (four) times daily -  with meals and at bedtime. 02/24/23   Gerhard Munch, MD  sulfamethoxazole-trimethoprim (BACTRIM DS) 800-160 MG tablet Take 1 tablet by mouth 2 (two) times daily. 01/03/23   Bing Neighbors, NP  VITAMIN D PO Take 1 tablet by mouth daily.    [provider]      Allergies    Patient has no known allergies.    Review of Systems   Review of Systems  Gastrointestinal:  Positive for abdominal pain, nausea and vomiting.  All other systems reviewed and are negative.   Physical Exam Updated Vital Signs BP (!) 144/83 (BP Location: Left Arm)   Pulse 95   Temp 98.2 F (36.8 C) (Oral)   Resp 17   Ht 5\' 4"  (1.626 m)   Wt 106.6 kg   SpO2 94%   BMI 40.34 kg/m  Physical Exam Vitals and nursing note reviewed.  Constitutional:      Appearance: Normal appearance.  HENT:     Head: Normocephalic and atraumatic.  Eyes:     Conjunctiva/sclera: Conjunctivae normal.  Cardiovascular:     Rate and Rhythm: Normal rate and regular rhythm.  Pulmonary:     Effort: Pulmonary effort is normal. No respiratory distress.     Breath sounds: Normal breath sounds.  Abdominal:     General: There is no distension.     Palpations: Abdomen is soft.     Tenderness: There is abdominal tenderness in the epigastric area. There is guarding.  Skin:    General: Skin is warm and dry.  Neurological:     General: No focal deficit present.     Mental Status: She is alert.     ED Results / Procedures / Treatments   Labs (all labs ordered are listed, but only abnormal results are displayed) Labs Reviewed  COMPREHENSIVE METABOLIC PANEL - Abnormal; Notable for the following components:      Result Value   Glucose, Bld 170 (*)    All other  components within normal limits  CBC - Abnormal; Notable for the following components:   RBC 5.29 (*)    RDW 15.6 (*)    Platelets 408 (*)    All other components within normal limits  URINALYSIS, ROUTINE W REFLEX MICROSCOPIC - Abnormal; Notable for the following components:   Specific Gravity, Urine 1.033 (*)    Glucose, UA >=500 (*)    Ketones, ur 5 (*)    All other components within normal limits  CBG MONITORING, ED - Abnormal; Notable for the following components:   Glucose-Capillary 122 (*)    All other components within  normal limits  LIPASE, BLOOD    EKG None  Radiology No results found.  Procedures Procedures    Medications Ordered in ED Medications  sodium chloride 0.9 % bolus 1,000 mL (0 mLs Intravenous Stopped 05/15/23 1916)  morphine (PF) 4 MG/ML injection 4 mg (4 mg Intravenous Given 05/15/23 1620)  ondansetron (ZOFRAN) injection 4 mg (4 mg Intravenous Given 05/15/23 1620)  iohexol (OMNIPAQUE) 300 MG/ML solution 100 mL (100 mLs Intravenous Contrast Given 05/15/23 1716)  fentaNYL (SUBLIMAZE) injection 50 mcg (50 mcg Intravenous Given 05/15/23 1921)  ketorolac (TORADOL) 30 MG/ML injection 30 mg (30 mg Intravenous Given 05/15/23 1922)  metoCLOPramide (REGLAN) injection 10 mg (10 mg Intravenous Given 05/15/23 1922)    ED Course/ Medical Decision Making/ A&P                                 Medical Decision Making Amount and/or Complexity of Data Reviewed Labs: ordered. Radiology: ordered.  Risk Prescription drug management.   This patient is a 57 y.o. female  who presents to the ED for concern of upper abdominal pain and vomiting.   Differential diagnoses prior to evaluation: The emergent differential diagnosis includes, but is not limited to,  PUD, GERD, gastritis, pancreatitis, gastroparesis, malignancy, biliary disease, ACS, pericarditis, pneumonia, intestinal ischemia, esophageal rupture, hepatitis. This is not an exhaustive differential.   Past Medical  History / Co-morbidities / Social History: diabetes, tobacco use, hypertension, marijuana use, recurrent nausea and vomiting, chronic pancreatitis  Additional history: Chart reviewed. Pertinent results include: Patient with several prior ER visits for abdominal pain, nausea and vomiting.  Sometimes admitted for intractable vomiting or findings of pancreatitis.  At one time thought to be related to gastroparesis or cyclical vomiting syndrome. Last seen at [facility] on 8/9 and given toradol, zofran, and IVF.   Reviewed some prior EKG's with borderline prolonged QT.   Physical Exam: Physical exam performed. The pertinent findings include: Hypertensive and mildly tachycardic. Laying on right side in obvious discomfort. Epigastrium tender to palpation with some guarding. Abdomen soft.   Lab Tests/Imaging studies: I personally interpreted labs/imaging and the pertinent results include:  CBC and CMP unremarkable. Normal lipase. Urinalysis with >=500 glucose with some ketones, negative for hematuria or infection.   CT abdomen/pelvis with no acute intraabdominal findings. I agree with the radiologist interpretation.  Medications: I ordered medication including zofran, morphine, IVF, fentanyl, reglan, and toradol.  I have reviewed the patients home medicines and have made adjustments as needed. Upon reevaluation, patient had almost complete resolution of pain with fentanyl, reglan, and toradol. Passed PO challenge.    Disposition: After consideration of the diagnostic results and the patients response to treatment, I feel that emergency department workup does not suggest an emergent condition requiring admission or immediate intervention beyond what has been performed at this time. The plan is: discharge to home with management of likely acute on chronic abdominal pain. Question gastroparesis or cyclical vomiting. Patient states she has antiemetics at home she will continue to use for her symptoms. Given  GI referral. The patient is safe for discharge and has been instructed to return immediately for worsening symptoms, change in symptoms or any other concerns.  Final Clinical Impression(s) / ED Diagnoses Final diagnoses:  Upper abdominal pain  Nausea and vomiting, unspecified vomiting type    Rx / DC Orders ED Discharge Orders     None      Portions of this  report may have been transcribed using voice recognition software. Every effort was made to ensure accuracy; however, inadvertent computerized transcription errors may be present.    Jeziel Hoffmann T, PA-C 05/19/23 1750    Elayne Snare K, DO 05/20/23 1456

## 2023-05-15 NOTE — Discharge Instructions (Signed)
You were seen in the ER for abdominal pain.  As we discussed, your blood work and imaging looks reassuring today. I suspect your symptoms could be related to something called gastroparesis.   I recommend following up with the GI doctor you saw before, but if not, I have attached the contact information for another office you could follow up with. I recommend going wherever you can be seen sooner!  Continue to monitor how you're doing and return to the ER for new or worsening symptoms.

## 2023-05-15 NOTE — ED Notes (Addendum)
Report received from previous RN. This RN assumes care of the patient.   7:23 PM  Patient reports upper midline epigastric pain radiating to LUQ and left side of back. Patient reports pain started at approximately noon and progressively worsened with nausea and vomiting. She reports subjective fevers and chills. Denies any abdominal surgeries. LBM today, which she reports was "looser" than normal. Denies urinary symptoms. She is alert and oriented x 4. She has equal rise and fall of the chest wall with clear lung sounds. She is medicated per order. Pending urinalysis. Bed in lowest position.   8:47 PM  Discharge instructions discussed with patient and patient voices understanding of discharge instructions. She denies any questions, needs, or concerns regarding discharge. Patient is stable at discharge and in NAD. She has equal and steady gait. She declines wheelchair.

## 2023-05-17 ENCOUNTER — Encounter (HOSPITAL_COMMUNITY): Payer: Self-pay

## 2023-05-17 ENCOUNTER — Emergency Department (HOSPITAL_COMMUNITY)
Admission: EM | Admit: 2023-05-17 | Discharge: 2023-05-17 | Disposition: A | Discharge status: Home / Self Care | Payer: 59 | Attending: Emergency Medicine | Admitting: Emergency Medicine

## 2023-05-17 ENCOUNTER — Other Ambulatory Visit: Payer: Self-pay

## 2023-05-17 ENCOUNTER — Emergency Department (HOSPITAL_COMMUNITY): Payer: 59

## 2023-05-17 DIAGNOSIS — D72829 Elevated white blood cell count, unspecified: Secondary | ICD-10-CM | POA: Diagnosis not present

## 2023-05-17 DIAGNOSIS — R1012 Left upper quadrant pain: Secondary | ICD-10-CM | POA: Insufficient documentation

## 2023-05-17 DIAGNOSIS — E119 Type 2 diabetes mellitus without complications: Secondary | ICD-10-CM | POA: Insufficient documentation

## 2023-05-17 DIAGNOSIS — R1013 Epigastric pain: Secondary | ICD-10-CM | POA: Insufficient documentation

## 2023-05-17 DIAGNOSIS — Z79899 Other long term (current) drug therapy: Secondary | ICD-10-CM | POA: Insufficient documentation

## 2023-05-17 DIAGNOSIS — Z794 Long term (current) use of insulin: Secondary | ICD-10-CM | POA: Insufficient documentation

## 2023-05-17 DIAGNOSIS — R000 Tachycardia, unspecified: Secondary | ICD-10-CM | POA: Insufficient documentation

## 2023-05-17 DIAGNOSIS — I10 Essential (primary) hypertension: Secondary | ICD-10-CM | POA: Diagnosis not present

## 2023-05-17 DIAGNOSIS — Z7982 Long term (current) use of aspirin: Secondary | ICD-10-CM | POA: Diagnosis not present

## 2023-05-17 DIAGNOSIS — R112 Nausea with vomiting, unspecified: Secondary | ICD-10-CM | POA: Insufficient documentation

## 2023-05-17 LAB — CBC
HCT: 44.3 % (ref 36.0–46.0)
Hemoglobin: 14.4 g/dL (ref 12.0–15.0)
MCH: 26.5 pg (ref 26.0–34.0)
MCHC: 32.5 g/dL (ref 30.0–36.0)
MCV: 81.4 fL (ref 80.0–100.0)
Platelets: 421 10*3/uL — ABNORMAL HIGH (ref 150–400)
RBC: 5.44 MIL/uL — ABNORMAL HIGH (ref 3.87–5.11)
RDW: 15.7 % — ABNORMAL HIGH (ref 11.5–15.5)
WBC: 12.4 10*3/uL — ABNORMAL HIGH (ref 4.0–10.5)
nRBC: 0 % (ref 0.0–0.2)

## 2023-05-17 LAB — COMPREHENSIVE METABOLIC PANEL
ALT: 22 U/L (ref 0–44)
AST: 34 U/L (ref 15–41)
Albumin: 4.6 g/dL (ref 3.5–5.0)
Alkaline Phosphatase: 112 U/L (ref 38–126)
Anion gap: 14 (ref 5–15)
BUN: 9 mg/dL (ref 6–20)
CO2: 22 mmol/L (ref 22–32)
Calcium: 9.5 mg/dL (ref 8.9–10.3)
Chloride: 101 mmol/L (ref 98–111)
Creatinine, Ser: 0.69 mg/dL (ref 0.44–1.00)
GFR, Estimated: >60 mL/min (ref >60)
Glucose, Bld: 131 mg/dL — ABNORMAL HIGH (ref 70–99)
Potassium: 4.1 mmol/L (ref 3.5–5.1)
Sodium: 137 mmol/L (ref 135–145)
Total Bilirubin: 1 mg/dL (ref 0.3–1.2)
Total Protein: 8.7 g/dL — ABNORMAL HIGH (ref 6.5–8.1)

## 2023-05-17 LAB — LIPASE, BLOOD: Lipase: 27 U/L (ref 11–51)

## 2023-05-17 MED ORDER — OXYCODONE-ACETAMINOPHEN 5-325 MG PO TABS
1.0000 | ORAL_TABLET | ORAL | 0 refills | Status: DC | PRN
Start: 2023-05-17 — End: 2023-08-27

## 2023-05-17 MED ORDER — HYDROMORPHONE HCL 1 MG/ML IJ SOLN
1.0000 mg | Freq: Once | INTRAMUSCULAR | Status: AC
  Administered 2023-05-17: 1 mg via INTRAVENOUS
  Filled 2023-05-17: qty 1

## 2023-05-17 MED ORDER — ONDANSETRON HCL 4 MG/2ML IJ SOLN
4.0000 mg | Freq: Once | INTRAMUSCULAR | Status: AC
  Administered 2023-05-17: 4 mg via INTRAVENOUS
  Filled 2023-05-17: qty 2

## 2023-05-17 MED ORDER — SODIUM CHLORIDE 0.9 % IV BOLUS
1000.0000 mL | Freq: Once | INTRAVENOUS | Status: AC
  Administered 2023-05-17: 1000 mL via INTRAVENOUS

## 2023-05-17 MED ORDER — LIDOCAINE VISCOUS HCL 2 % MT SOLN
15.0000 mL | Freq: Once | OROMUCOSAL | Status: AC
  Administered 2023-05-17: 15 mL via ORAL
  Filled 2023-05-17: qty 15

## 2023-05-17 MED ORDER — PROMETHAZINE HCL 25 MG PO TABS: 25.0000 mg | ORAL_TABLET | Freq: Four times a day (QID) | ORAL | 0 refills | Status: DC | PRN

## 2023-05-17 MED ORDER — ALUM & MAG HYDROXIDE-SIMETH 200-200-20 MG/5ML PO SUSP
30.0000 mL | Freq: Once | ORAL | Status: AC
  Administered 2023-05-17: 30 mL via ORAL
  Filled 2023-05-17: qty 30

## 2023-05-17 NOTE — ED Notes (Signed)
Unable to collect labs at this time. 

## 2023-05-17 NOTE — ED Provider Notes (Signed)
[facility] AT [facility] Provider Note   CSN: [id number] Arrival date & time: 05/17/23  1501     History  Chief Complaint  Patient presents with   Abdominal Pain    [name] is a 57 y.o. female.  The history is provided by the patient and medical records. No language interpreter was used.  Abdominal Pain Pain location:  Epigastric Pain quality: aching and sharp   Pain radiates to:  Does not radiate Pain severity:  Severe Onset quality:  Gradual Duration:  2 days Timing:  Constant Progression:  Waxing and waning Chronicity:  Recurrent Context: not trauma   Relieved by:  Nothing Worsened by:  Nothing Ineffective treatments:  None tried Associated symptoms: nausea and vomiting   Associated symptoms: no chest pain, no chills, no constipation, no cough, no diarrhea, no dysuria, no fatigue, no fever, no flatus, no shortness of breath, no vaginal bleeding and no vaginal discharge        Home Medications Prior to Admission medications   Medication Sig Start Date End Date Taking? Authorizing Provider  acetaminophen (TYLENOL) 500 MG tablet Take 500 mg by mouth 2 (two) times daily as needed for moderate pain.    [provider]  albuterol (PROVENTIL) (2.5 MG/3ML) 0.083% nebulizer solution Take 3 mLs (2.5 mg total) by nebulization every 6 (six) hours as needed for wheezing or shortness of breath. 12/12/19   Cathie Hoops, Amy V, PA-C  albuterol (VENTOLIN HFA) 108 (90 Base) MCG/ACT inhaler Inhale 2 puffs into the lungs every 4 (four) hours as needed for wheezing. 06/07/22   Tomi Bamberger, PA-C  ALPRAZolam Prudy Feeler) 1 MG tablet Take 1 mg by mouth daily as needed for anxiety. 10/23/19   [provider]  amLODipine-olmesartan (AZOR) 5-40 MG tablet Take 1 tablet by mouth daily. 06/05/21   [provider]  aspirin EC 81 MG tablet Take 81 mg by mouth daily. Swallow whole.    [provider]  CREON 36000-114000 units CPEP  capsule Take 36,000 Units by mouth daily. Patient not taking: Reported on 12/27/2021 10/16/21   [provider]  dicyclomine (BENTYL) 10 MG capsule Take 10 mg by mouth daily. 10/16/21   [provider]  escitalopram (LEXAPRO) 20 MG tablet Take 20 mg by mouth daily. 08/10/20   [provider]  fluticasone (FLONASE) 50 MCG/ACT nasal spray Place 1 spray into both nostrils daily as needed for allergies or rhinitis.    [provider]  ibuprofen (ADVIL) 800 MG tablet Take 1 tablet (800 mg total) by mouth every 8 (eight) hours as needed (pain). Patient not taking: Reported on 09/05/2022 12/27/21   Zenia Resides, MD  insulin aspart (NOVOLOG) 100 UNIT/ML FlexPen Inject 0-9 Units into the skin 3 (three) times daily with meals. CBG 70 - 120: 0 units ,CBG 121 - 150:1 unit  ,CBG 151 - 200:2 units, CBG 201 - 250:3 units,CBG 251 - 300:5 units,CBG 301 - 350: 7 units ,CBG 351 - 400:9 units,CBG > 400 call MD and obtain STAT lab verification 09/08/22   Burnadette Pop, MD  insulin degludec (TRESIBA) 100 UNIT/ML FlexTouch Pen Inject 30 Units into the skin daily. 09/08/22   Burnadette Pop, MD  Insulin Pen Needle 32G X 4 MM MISC 1 each by Does not apply route 3 (three) times daily. 09/08/22   Burnadette Pop, MD  metoCLOPramide (REGLAN) 10 MG tablet Take 1 tablet (10 mg total) by mouth every 8 (eight) hours as needed for nausea.  09/08/22   Burnadette Pop, MD  metoprolol succinate (TOPROL-XL) 50 MG 24 hr tablet Take 50 mg by mouth daily. 07/21/14   [provider]  nicotine (NICODERM CQ - DOSED IN MG/24 HOURS) 14 mg/24hr patch Place 1 patch (14 mg total) onto the skin daily. 09/09/22   Burnadette Pop, MD  ondansetron (ZOFRAN-ODT) 4 MG disintegrating tablet Take 1 tablet (4 mg total) by mouth every 8 (eight) hours as needed for nausea or vomiting. 03/02/23   Sabas Sous, MD  polyethylene glycol (MIRALAX / GLYCOLAX) 17 g packet Take 17 g by mouth daily as needed. 09/08/22   Burnadette Pop, MD  simvastatin (ZOCOR) 20 MG tablet Take 20 mg by mouth daily. 08/22/20   [provider]  sucralfate (CARAFATE) 1 g tablet Take 1 tablet (1 g total) by mouth 4 (four) times daily -  with meals and at bedtime. 02/24/23   Gerhard Munch, MD  sulfamethoxazole-trimethoprim (BACTRIM DS) 800-160 MG tablet Take 1 tablet by mouth 2 (two) times daily. 01/03/23   Bing Neighbors, NP  VITAMIN D PO Take 1 tablet by mouth daily.    [provider]  promethazine (PHENERGAN) 25 MG tablet Take 1 tablet (25 mg total) by mouth every 6 (six) hours as needed for nausea or vomiting. Patient not taking: No sig reported 08/15/20 10/19/20  Wieters, Fran Lowes C, PA-C      Allergies    Patient has no known allergies.    Review of Systems   Review of Systems  Constitutional:  Negative for chills, fatigue and fever.  HENT:  Negative for congestion.   Respiratory:  Negative for cough, chest tightness, shortness of breath and wheezing.   Cardiovascular:  Negative for chest pain and palpitations.  Gastrointestinal:  Positive for abdominal pain, nausea and vomiting. Negative for abdominal distention, constipation, diarrhea and flatus.  Genitourinary:  Negative for dysuria, flank pain, frequency, vaginal bleeding and vaginal discharge.  Musculoskeletal:  Negative for back pain, neck pain and neck stiffness.  Skin:  Negative for rash and wound.  Neurological:  Negative for dizziness, light-headedness, numbness and headaches.  Psychiatric/Behavioral:  Positive for agitation. Negative for confusion.   All other systems reviewed and are negative.   Physical Exam Updated Vital Signs BP 128/85   Pulse (!) 104   Temp 99.2 F (37.3 C) (Oral)   Resp 18   Ht 5\' 4"  (1.626 m)   Wt 106.6 kg   SpO2 100%   BMI 40.34 kg/m  Physical Exam Vitals and nursing note reviewed.  Constitutional:      General: She is not in acute distress.    Appearance: She is well-developed. She is ill-appearing.  HENT:      Head: Normocephalic and atraumatic.  Eyes:     Conjunctiva/sclera: Conjunctivae normal.  Cardiovascular:     Rate and Rhythm: Regular rhythm. Tachycardia present.     Heart sounds: No murmur heard. Pulmonary:     Effort: Pulmonary effort is normal. No respiratory distress.     Breath sounds: Normal breath sounds.  Abdominal:     General: Abdomen is flat. Bowel sounds are normal.     Palpations: Abdomen is soft.     Tenderness: There is abdominal tenderness in the epigastric area and left upper quadrant. There is no right CVA tenderness, left CVA tenderness, guarding or rebound.  Musculoskeletal:        General: No swelling.     Cervical back: Neck supple.  Skin:    General:  Skin is warm and dry.     Capillary Refill: Capillary refill takes less than 2 seconds.     Coloration: Skin is not pale.  Neurological:     General: No focal deficit present.     Mental Status: She is alert.  Psychiatric:        Mood and Affect: Mood is anxious.     ED Results / Procedures / Treatments   Labs (all labs ordered are listed, but only abnormal results are displayed) Labs Reviewed  COMPREHENSIVE METABOLIC PANEL - Abnormal; Notable for the following components:      Result Value   Glucose, Bld 131 (*)    Total Protein 8.7 (*)    All other components within normal limits  CBC - Abnormal; Notable for the following components:   WBC 12.4 (*)    RBC 5.44 (*)    RDW 15.7 (*)    Platelets 421 (*)    All other components within normal limits  LIPASE, BLOOD  URINALYSIS, ROUTINE W REFLEX MICROSCOPIC  LACTIC ACID, PLASMA    EKG None  Radiology US Abdomen Limited RUQ (LIVER/GB)  Result Date: 05/17/2023 CLINICAL DATA:  Abdominal pain, status post cholecystectomy EXAM: ULTRASOUND ABDOMEN LIMITED RIGHT UPPER QUADRANT COMPARISON:  None Available. FINDINGS: Gallbladder: Status post cholecystectomy. Common bile duct: Diameter: 0.4 cm Liver: No focal lesion identified. Increased parenchymal  echogenicity. Portal vein is patent on color Doppler imaging with normal direction of blood flow towards the liver. Other: None. IMPRESSION: 1. Status post cholecystectomy. No biliary dilatation. 2. Hepatic steatosis. Electronically Signed   By: Jearld Lesch M.D.   On: 05/17/2023 18:34    Procedures Procedures    Medications Ordered in ED Medications  HYDROmorphone (DILAUDID) injection 1 mg (1 mg Intravenous Given 05/17/23 1724)  alum & mag hydroxide-simeth (MAALOX/MYLANTA) 200-200-20 MG/5ML suspension 30 mL (30 mLs Oral Given 05/17/23 1724)    And  lidocaine (XYLOCAINE) 2 % viscous mouth solution 15 mL (15 mLs Oral Given 05/17/23 1724)  ondansetron (ZOFRAN) injection 4 mg (4 mg Intravenous Given 05/17/23 1724)  sodium chloride 0.9 % bolus 1,000 mL (1,000 mLs Intravenous New Bag/Given 05/17/23 1752)    ED Course/ Medical Decision Making/ A&P                                 Medical Decision Making Amount and/or Complexity of Data Reviewed Labs: ordered. Radiology: ordered.  Risk OTC drugs. Prescription drug management.    Tanea Moga is a 57 y.o. female with a past medical history significant for diabetes, hypertension, GERD, hyperlipidemia, anxiety, and previous pancreatitis who presents with continued nausea, vomiting, dry heaving, and epigastric abdominal pain.  Patient reports that pain began over the last 2 days and has been severe.  She reports she had a CT scan in Emergency Department within the last 40 hours that was reassuring and she was sent home but is still having nausea and vomiting and epigastric pain.  She reports she has a long history of marijuana abuse and reports no changes.  She reports she is not tolerating much p.o. but is having normal bowel movements and normal urinary output.  She denies dysuria.  Denies any trauma.  Denies any lower abdominal pain.  Denies any chest pain shortness of breath or cough.  Denies fevers or chills.  She reports she has had some  pancreatitis issues in the past.  She denies any alcohol use.  Reports she is not feeling well otherwise.  Denies any spicy or foods that were suspicious that could have caused her symptoms.  On exam, patient is holding her epigastric area and rolling on the bed.  She is screaming and having dry heaving and nausea and vomiting.  Abdomen is tender to palpation especially upper abdomen and left upper quadrant.  Lungs are clear.  Chest nontender.  Good pulses in extremities.  Dry mucous membranes.  Clinically I do feel need to give the patient some fluids, pain medicine, and nausea medicine.  As she just had a CT scan within the last 2 days she does not want a repeat CT and I agree.  Will get an upper abdomen ultrasound to look for some ductal dilation although her gallbladder is already removed.  Will get screening labs and give her some medications to help her symptoms.  Will give a GI cocktail as well.  Anticipate reassessment after workup to determine disposition.  After medications patient had resolution of symptoms and is feeling much better.  Workup was overall reassuring aside from mild leukocytosis.  Ultrasound showed steatosis but no other concerning findings.  Patient otherwise well-appearing.  Patient will be discharged home approved for nausea medicine and pain medicine and we discussed avoiding marijuana as it could be a cannabinoid related hyperemesis syndrome.  She will follow-up with her PCP and the GI doctor.  She agrees with plan of care and was discharged in good condition.         Final Clinical Impression(s) / ED Diagnoses Final diagnoses:  Nausea and vomiting, unspecified vomiting type  Epigastric abdominal pain    Rx / DC Orders ED Discharge Orders          Ordered    oxyCODONE-acetaminophen (PERCOCET/ROXICET) 5-325 MG tablet  Every 4 hours PRN        05/17/23 2213    promethazine (PHENERGAN) 25 MG tablet  Every 6 hours PRN        05/17/23 2213            Clinical Impression: 1. Nausea and vomiting, unspecified vomiting type   2. Epigastric abdominal pain     Disposition: Discharge  Condition: Good  I have discussed the results, Dx and Tx plan with the pt(& family if present). He/she/they expressed understanding and agree(s) with the plan. Discharge instructions discussed at great length. Strict return precautions discussed and pt &/or family have verbalized understanding of the instructions. No further questions at time of discharge.    New Prescriptions   OXYCODONE-ACETAMINOPHEN (PERCOCET/ROXICET) 5-325 MG TABLET    Take 1 tablet by mouth every 4 (four) hours as needed for severe pain.   PROMETHAZINE (PHENERGAN) 25 MG TABLET    Take 1 tablet (25 mg total) by mouth every 6 (six) hours as needed for nausea or vomiting.    Follow Up: Fleet Contras, MD [address] [place] [address] [phone]     [facility] Emergency Department at [facility] [address] [place] [address] [phone]    Gastroenterology, Deboraha Sprang [address] [place] [address] [place] [address] [phone]        Kendrah Lovern, Canary Brim, MD 05/17/23 2215

## 2023-05-17 NOTE — Discharge Instructions (Signed)
Your history, exam, and evaluation did not reveal concerning findings that would require admission however I do suspect you may have a cannabinol hyperemesis type syndrome causing all your symptoms recurring.  Please avoid marijuana and rest and stay hydrated.  Please use the nausea and pain medicine and follow-up with both your primary doctor and a GI doctor.  If any symptoms change or worsen acutely, return to the nearest emergency department.

## 2023-05-17 NOTE — ED Triage Notes (Signed)
Pt complaining of abd pain N/V. Pt seen yesterday for same complaint. Was told by PCP she has pancreatitis.

## 2023-06-02 ENCOUNTER — Encounter: Payer: Self-pay | Admitting: Emergency Medicine

## 2023-06-02 ENCOUNTER — Ambulatory Visit
Admission: EM | Admit: 2023-06-02 | Discharge: 2023-06-02 | Disposition: A | Discharge status: Home / Self Care | Payer: 59 | Attending: Physician Assistant | Admitting: Physician Assistant

## 2023-06-02 DIAGNOSIS — M5441 Lumbago with sciatica, right side: Secondary | ICD-10-CM

## 2023-06-02 MED ORDER — CYCLOBENZAPRINE HCL 10 MG PO TABS
10.0000 mg | ORAL_TABLET | Freq: Two times a day (BID) | ORAL | 0 refills | Status: AC | PRN
Start: 2023-06-02 — End: ?

## 2023-06-02 MED ORDER — PREDNISONE 20 MG PO TABS
40.0000 mg | ORAL_TABLET | Freq: Every day | ORAL | 0 refills | Status: AC
Start: 2023-06-02 — End: 2023-06-07

## 2023-06-02 NOTE — ED Provider Notes (Signed)
[facility]    CSN: [id number] Arrival date & time: 06/02/23  0808      History   Chief Complaint Chief Complaint  Patient presents with   Back Pain    HPI [name] is a 57 y.o. female.   Patient here today for evaluation of right-sided lower back pain that started about 5 days ago.  She reports that pain radiates down into her right leg to the point of her kneecap.  She denies any known injury but has had previous spinal surgery.  She reports she has difficulty finding comfortable position has had trouble sleeping.  She notes that lying down and sitting for longer periods seem to exacerbate symptoms.  She has not had any numbness but does report some pins and needle sensation in her right foot at times.  She has taken Tylenol and BC powder without resolution.  She notes she has also tried heat and ice without significant improvement.  The history is provided by the patient.    Past Medical History:  Diagnosis Date   Anxiety    Arthritis    rheumatoid , osteoarthritis   Bronchitis    Chronic back pain    Depression    Diabetes mellitus    Fatty liver    Fibromyalgia    GERD (gastroesophageal reflux disease)    Headache    Hypertension    Pancreatitis    Pneumonia    Rotator cuff tear, right     Patient Active Problem List   Diagnosis Date Noted   GERD (gastroesophageal reflux disease) 08/14/2022   HLD (hyperlipidemia) 08/14/2022   Adrenal adenoma, left 09/13/2021   Intractable nausea and vomiting 09/12/2021   Anxiety 09/12/2021   Gastroenteritis 09/09/2021   Type 2 diabetes mellitus with obesity (HCC) 09/09/2021   Class 2 obesity due to excess calories with body mass index (BMI) of 36.0 to 36.9 in adult 09/09/2021   Marijuana abuse 09/09/2021   Tobacco dependence 09/09/2021   Essential hypertension 05/29/2020   Abdominal pain 11/23/2019   Acute pancreatitis 11/21/2019   De Quervain's syndrome (tenosynovitis) 07/06/2018   Elbow  arthritis 07/06/2018   Complete tear of right rotator cuff 07/22/2017    Class: Chronic   Retained orthopedic hardware 07/22/2017    Class: Chronic   Cervical disc herniation 07/21/2017    Class: Acute   Status post cervical spinal fusion 07/21/17 07/21/2017   Chronic back pain     Past Surgical History:  Procedure Laterality Date   ABDOMINAL HYSTERECTOMY     ANTERIOR CERVICAL DECOMP/DISCECTOMY FUSION N/A 07/21/2017   Procedure: ANTERIOR CERVICAL DISCECTOMY AND FUSION C6-7, REMOVAL OF SCREW C6 PLATE, DEPUY ZERO-P IMPLANT, LOCAL BONE GRAFT, ALLOGRAFT BONE GRAFT, VIVIGEN;  Surgeon: Kerrin Champagne, MD;  Location: [facility] OR;  Service: Orthopedics;  Laterality: N/A;   BACK SURGERY     laminectomy   BONE GRAFT HIP ILIAC CREST     BREAST BIOPSY     CARPAL TUNNEL RELEASE     CERVICAL DISCECTOMY  07/21/2017   CESAREAN SECTION     CHOLECYSTECTOMY     COLONOSCOPY     frontal fusion     neck fusion     patient reported   SHOULDER ARTHROSCOPY WITH SUBACROMIAL DECOMPRESSION, ROTATOR CUFF REPAIR AND BICEP TENDON REPAIR Right 10/31/2017   Procedure: RIGHT SHOULDER ARTHROSCOPY, MANIPULATION UNDER ANESTHESIA, BICEPS TENODESIS, AND MINI OPEN ROTATOR CUFF TEAR REPAIR;  Surgeon: Cammy Copa, MD;  Location: [facility] OR;  Service: Orthopedics;  Laterality: Right;    OB History   No obstetric history on file.      Home Medications    Prior to Admission medications   Medication Sig Start Date End Date Taking? Authorizing Provider  acetaminophen (TYLENOL) 500 MG tablet Take 500 mg by mouth 2 (two) times daily as needed for moderate pain.   Yes [provider]  albuterol (PROVENTIL) (2.5 MG/3ML) 0.083% nebulizer solution Take 3 mLs (2.5 mg total) by nebulization every 6 (six) hours as needed for wheezing or shortness of breath. 12/12/19  Yes Yu, Amy V, PA-C  albuterol (VENTOLIN HFA) 108 (90 Base) MCG/ACT inhaler Inhale 2 puffs into the lungs every 4 (four) hours as needed for wheezing. 06/07/22   Yes Tomi Bamberger, PA-C  ALPRAZolam Prudy Feeler) 1 MG tablet Take 1 mg by mouth daily as needed for anxiety. 10/23/19  Yes [provider]  amLODipine-olmesartan (AZOR) 5-40 MG tablet Take 1 tablet by mouth daily. 06/05/21  Yes [provider]  aspirin EC 81 MG tablet Take 81 mg by mouth daily. Swallow whole.   Yes [provider]  cyclobenzaprine (FLEXERIL) 10 MG tablet Take 1 tablet (10 mg total) by mouth 2 (two) times daily as needed for muscle spasms. 06/02/23  Yes Tomi Bamberger, PA-C  dicyclomine (BENTYL) 10 MG capsule Take 10 mg by mouth daily. 10/16/21  Yes [provider]  escitalopram (LEXAPRO) 20 MG tablet Take 20 mg by mouth daily. 08/10/20  Yes [provider]  fluticasone (FLONASE) 50 MCG/ACT nasal spray Place 1 spray into both nostrils daily as needed for allergies or rhinitis.   Yes [provider]  insulin aspart (NOVOLOG) 100 UNIT/ML FlexPen Inject 0-9 Units into the skin 3 (three) times daily with meals. CBG 70 - 120: 0 units ,CBG 121 - 150:1 unit  ,CBG 151 - 200:2 units, CBG 201 - 250:3 units,CBG 251 - 300:5 units,CBG 301 - 350: 7 units ,CBG 351 - 400:9 units,CBG > 400 call MD and obtain STAT lab verification 09/08/22  Yes Adhikari, Willia Craze, MD  insulin degludec (TRESIBA) 100 UNIT/ML FlexTouch Pen Inject 30 Units into the skin daily. 09/08/22  Yes Burnadette Pop, MD  Insulin Pen Needle 32G X 4 MM MISC 1 each by Does not apply route 3 (three) times daily. 09/08/22  Yes Burnadette Pop, MD  metoCLOPramide (REGLAN) 10 MG tablet Take 1 tablet (10 mg total) by mouth every 8 (eight) hours as needed for nausea. 09/08/22  Yes Burnadette Pop, MD  metoprolol succinate (TOPROL-XL) 50 MG 24 hr tablet Take 50 mg by mouth daily. 07/21/14  Yes [provider]  nicotine (NICODERM CQ - DOSED IN MG/24 HOURS) 14 mg/24hr patch Place 1 patch (14 mg total) onto the skin daily. 09/09/22  Yes Burnadette Pop, MD  ondansetron (ZOFRAN-ODT) 4 MG  disintegrating tablet Take 1 tablet (4 mg total) by mouth every 8 (eight) hours as needed for nausea or vomiting. 03/02/23  Yes Bero, Elmer Sow, MD  oxyCODONE-acetaminophen (PERCOCET/ROXICET) 5-325 MG tablet Take 1 tablet by mouth every 4 (four) hours as needed for severe pain. 05/17/23  Yes Tegeler, Canary Brim, MD  polyethylene glycol (MIRALAX / GLYCOLAX) 17 g packet Take 17 g by mouth daily as needed. 09/08/22  Yes Burnadette Pop, MD  predniSONE (DELTASONE) 20 MG tablet Take 2 tablets (40 mg total) by mouth daily with breakfast for 5 days. 06/02/23 06/07/23 Yes Tomi Bamberger, PA-C  promethazine (PHENERGAN) 25 MG tablet Take 1 tablet (25 mg total) by  mouth every 6 (six) hours as needed for nausea or vomiting. 05/17/23  Yes Tegeler, Canary Brim, MD  simvastatin (ZOCOR) 20 MG tablet Take 20 mg by mouth daily. 08/22/20  Yes [provider]  sucralfate (CARAFATE) 1 g tablet Take 1 tablet (1 g total) by mouth 4 (four) times daily -  with meals and at bedtime. 02/24/23  Yes Gerhard Munch, MD  sulfamethoxazole-trimethoprim (BACTRIM DS) 800-160 MG tablet Take 1 tablet by mouth 2 (two) times daily. 01/03/23  Yes Bing Neighbors, NP  VITAMIN D PO Take 1 tablet by mouth daily.   Yes [provider]  CREON 36000-114000 units CPEP capsule Take 36,000 Units by mouth daily. Patient not taking: Reported on 12/27/2021 10/16/21   [provider]  ibuprofen (ADVIL) 800 MG tablet Take 1 tablet (800 mg total) by mouth every 8 (eight) hours as needed (pain). Patient not taking: Reported on 09/05/2022 12/27/21   Zenia Resides, MD    Family History Family History  Problem Relation Age of Onset   Hypertension Father    Renal Disease Father    Diabetes Mother    Hypertension Mother    Edema Mother    Diabetes Sister    Diabetes Brother     Social History Social History   Tobacco Use   Smoking status: Every Day    Current packs/day: 0.50    Average packs/day: 0.5 packs/day  for 40.0 years (20.0 ttl pk-yrs)    Types: Cigarettes   Smokeless tobacco: Never  Vaping Use   Vaping status: Never Used  Substance Use Topics   Alcohol use: No   Drug use: Yes    Types: Marijuana    Comment: 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   Review of Systems Review of Systems  Constitutional:  Negative for chills and fever.  Eyes:  Negative for discharge and redness.  Respiratory:  Negative for shortness of breath.   Gastrointestinal:  Negative for abdominal pain, nausea and vomiting.  Musculoskeletal:  Positive for back pain and myalgias.  Neurological:  Negative for numbness.     Physical Exam Triage Vital Signs ED Triage Vitals  Encounter Vitals Group     BP      Systolic BP Percentile      Diastolic BP Percentile      Pulse      Resp      Temp      Temp src      SpO2      Weight      Height      Head Circumference      Peak Flow      Pain Score      Pain Loc      Pain Education      Exclude from Growth Chart    No data found.  Updated Vital Signs BP (!) 149/80 (BP Location: Left Arm)   Pulse 88   Temp 98.1 F (36.7 C) (Oral)   Resp 18   Ht 5\' 4"  (1.626 m)   Wt 230 lb (104.3 kg)   SpO2 94%   BMI 39.48 kg/m      Physical Exam Vitals and nursing note reviewed.  Constitutional:      General: She is not in acute distress.    Appearance: Normal appearance. She is not ill-appearing.  HENT:  Head: Normocephalic and atraumatic.  Eyes:     Conjunctiva/sclera: Conjunctivae normal.  Cardiovascular:     Rate and Rhythm: Normal rate.  Pulmonary:     Effort: Pulmonary effort is normal. No respiratory distress.  Musculoskeletal:     Comments: No TTP to midline thoracic or lumbar spine, mild TTP to right lower back.   Neurological:     Mental Status: She is alert.  Psychiatric:        Mood and Affect: Mood normal.        Behavior:  Behavior normal.        Thought Content: Thought content normal.      UC Treatments / Results  Labs (all labs ordered are listed, but only abnormal results are displayed) Labs Reviewed - No data to display  EKG   Radiology No results found.  Procedures Procedures (including critical care time)  Medications Ordered in UC Medications - No data to display  Initial Impression / Assessment and Plan / UC Course  I have reviewed the triage vital signs and the nursing notes.  Pertinent labs & imaging results that were available during my care of the patient were reviewed by me and considered in my medical decision making (see chart for details).     Suspect likely sciatic.  Will treat with steroid burst and muscle relaxer.  Advised that muscle relaxer may cause drowsiness and that steroid may increase glucose levels and advised to monitor same.  Recommended follow-up if no gradual improvement or with any worsening symptoms.  Patient expresses understanding.   Final Clinical Impressions(s) / UC Diagnoses   Final diagnoses:  Acute right-sided low back pain with right-sided sciatica   Discharge Instructions   None    ED Prescriptions     Medication Sig Dispense Auth. Provider   predniSONE (DELTASONE) 20 MG tablet Take 2 tablets (40 mg total) by mouth daily with breakfast for 5 days. 10 tablet Erma Pinto F, PA-C   cyclobenzaprine (FLEXERIL) 10 MG tablet Take 1 tablet (10 mg total) by mouth 2 (two) times daily as needed for muscle spasms. 20 tablet Tomi Bamberger, PA-C      PDMP not reviewed this encounter.   Tomi Bamberger, PA-C 06/02/23 [phone]

## 2023-06-02 NOTE — ED Triage Notes (Signed)
Patient c/o right sided lower back pain that radiates around to her groin area and goes down leg, behind the knee cap x 5 days.  No apparent injury.  Patient states that she is unable to sleep.  Patient has taken Tylenol and BC Powder w/o relief.

## 2023-06-09 ENCOUNTER — Emergency Department (HOSPITAL_COMMUNITY)
Admission: EM | Admit: 2023-06-09 | Discharge: 2023-06-09 | Disposition: A | Discharge status: Home / Self Care | Payer: 59 | Attending: Emergency Medicine | Admitting: Emergency Medicine

## 2023-06-09 ENCOUNTER — Other Ambulatory Visit: Payer: Self-pay

## 2023-06-09 ENCOUNTER — Encounter (HOSPITAL_COMMUNITY): Payer: Self-pay

## 2023-06-09 DIAGNOSIS — E119 Type 2 diabetes mellitus without complications: Secondary | ICD-10-CM | POA: Insufficient documentation

## 2023-06-09 DIAGNOSIS — M5431 Sciatica, right side: Secondary | ICD-10-CM | POA: Diagnosis present

## 2023-06-09 DIAGNOSIS — Z794 Long term (current) use of insulin: Secondary | ICD-10-CM | POA: Insufficient documentation

## 2023-06-09 MED ORDER — LIDOCAINE 5 % EX PTCH: 1.0000 | MEDICATED_PATCH | CUTANEOUS | 0 refills | Status: AC

## 2023-06-09 MED ORDER — ACETAMINOPHEN 325 MG PO TABS
650.0000 mg | ORAL_TABLET | Freq: Once | ORAL | Status: AC
  Administered 2023-06-09: 650 mg via ORAL
  Filled 2023-06-09: qty 2

## 2023-06-09 MED ORDER — LIDOCAINE 5 % EX PTCH
1.0000 | MEDICATED_PATCH | CUTANEOUS | Status: DC
  Administered 2023-06-09: 1 via TRANSDERMAL
  Filled 2023-06-09: qty 1

## 2023-06-09 MED ORDER — DEXAMETHASONE SODIUM PHOSPHATE 10 MG/ML IJ SOLN
10.0000 mg | Freq: Once | INTRAMUSCULAR | Status: AC
  Administered 2023-06-09: 10 mg via INTRAMUSCULAR
  Filled 2023-06-09: qty 1

## 2023-06-09 MED ORDER — GABAPENTIN 100 MG PO CAPS
100.0000 mg | ORAL_CAPSULE | Freq: Three times a day (TID) | ORAL | 0 refills | Status: DC
Start: 2023-06-09 — End: 2023-10-09

## 2023-06-09 NOTE — ED Triage Notes (Signed)
Pt presents via POV c/o "sciatica pain" x1 week in lower back into right leg/groin. Reports seen at Urgent care and given Flexeril and Prednisone with relief however reports pain has returned.

## 2023-06-09 NOTE — ED Provider Notes (Signed)
[place] Provider Note   CSN: [id number] Arrival date & time: 06/09/23  0415     History  Chief Complaint  Patient presents with   Back Pain    [name] is a 57 y.o. female history of right-sided sciatica, diabetes, history of lumbar laminectomy presented with chronic right sciatica pain.  Patient states that she was at the urgent care a week ago when this began to flareup and was given prednisone along with Flexeril which has been helping however she is out of the prednisone and the sciatica has come back.  Patient states that her chronic sciatica is that we will issue from her right low back down to her right knee and foot.  Patient been taking ibuprofen and Tylenol to no relief.  Patient is unsure if the Flexeril is helping.  Patient notes that her neurosurgeon retired and so she has not seen a neurosurgeon in some time.  Patient denies any recent trauma, falls, saddle anesthesia, new onset weakness, urinary/bowel incontinence, fevers, IVDU, night sweats.  Patient states that she has been able to walk with her cane at baseline denies any new onset weakness is preventing her from walking.  Home Medications Prior to Admission medications   Medication Sig Start Date End Date Taking? Authorizing Provider  gabapentin (NEURONTIN) 100 MG capsule Take 1 capsule (100 mg total) by mouth 3 (three) times daily. 06/09/23 07/09/23 Yes Sherae Santino, Beverly Gust, PA-C  lidocaine (LIDODERM) 5 % Place 1 patch onto the skin daily. Remove & Discard patch within 12 hours or as directed by MD 06/09/23  Yes Zadie Deemer, Beverly Gust, PA-C  acetaminophen (TYLENOL) 500 MG tablet Take 500 mg by mouth 2 (two) times daily as needed for moderate pain.    [provider]  albuterol (PROVENTIL) (2.5 MG/3ML) 0.083% nebulizer solution Take 3 mLs (2.5 mg total) by nebulization every 6 (six) hours as needed for wheezing or shortness of breath. 12/12/19   Cathie Hoops, Amy V, PA-C   albuterol (VENTOLIN HFA) 108 (90 Base) MCG/ACT inhaler Inhale 2 puffs into the lungs every 4 (four) hours as needed for wheezing. 06/07/22   Tomi Bamberger, PA-C  ALPRAZolam Prudy Feeler) 1 MG tablet Take 1 mg by mouth daily as needed for anxiety. 10/23/19   [provider]  amLODipine-olmesartan (AZOR) 5-40 MG tablet Take 1 tablet by mouth daily. 06/05/21   [provider]  aspirin EC 81 MG tablet Take 81 mg by mouth daily. Swallow whole.    [provider]  CREON 36000-114000 units CPEP capsule Take 36,000 Units by mouth daily. Patient not taking: Reported on 12/27/2021 10/16/21   [provider]  cyclobenzaprine (FLEXERIL) 10 MG tablet Take 1 tablet (10 mg total) by mouth 2 (two) times daily as needed for muscle spasms. 06/02/23   Tomi Bamberger, PA-C  dicyclomine (BENTYL) 10 MG capsule Take 10 mg by mouth daily. 10/16/21   [provider]  escitalopram (LEXAPRO) 20 MG tablet Take 20 mg by mouth daily. 08/10/20   [provider]  fluticasone (FLONASE) 50 MCG/ACT nasal spray Place 1 spray into both nostrils daily as needed for allergies or rhinitis.    [provider]  ibuprofen (ADVIL) 800 MG tablet Take 1 tablet (800 mg total) by mouth every 8 (eight) hours as needed (pain). Patient not taking: Reported on 09/05/2022 12/27/21   Zenia Resides, MD  insulin aspart (NOVOLOG) 100 UNIT/ML FlexPen Inject 0-9 Units into the skin 3 (three)  times daily with meals. CBG 70 - 120: 0 units ,CBG 121 - 150:1 unit  ,CBG 151 - 200:2 units, CBG 201 - 250:3 units,CBG 251 - 300:5 units,CBG 301 - 350: 7 units ,CBG 351 - 400:9 units,CBG > 400 call MD and obtain STAT lab verification 09/08/22   Burnadette Pop, MD  insulin degludec (TRESIBA) 100 UNIT/ML FlexTouch Pen Inject 30 Units into the skin daily. 09/08/22   Burnadette Pop, MD  Insulin Pen Needle 32G X 4 MM MISC 1 each by Does not apply route 3 (three) times daily. 09/08/22   Burnadette Pop, MD   metoCLOPramide (REGLAN) 10 MG tablet Take 1 tablet (10 mg total) by mouth every 8 (eight) hours as needed for nausea. 09/08/22   Burnadette Pop, MD  metoprolol succinate (TOPROL-XL) 50 MG 24 hr tablet Take 50 mg by mouth daily. 07/21/14   [provider]  nicotine (NICODERM CQ - DOSED IN MG/24 HOURS) 14 mg/24hr patch Place 1 patch (14 mg total) onto the skin daily. 09/09/22   Burnadette Pop, MD  ondansetron (ZOFRAN-ODT) 4 MG disintegrating tablet Take 1 tablet (4 mg total) by mouth every 8 (eight) hours as needed for nausea or vomiting. 03/02/23   Sabas Sous, MD  oxyCODONE-acetaminophen (PERCOCET/ROXICET) 5-325 MG tablet Take 1 tablet by mouth every 4 (four) hours as needed for severe pain. 05/17/23   Tegeler, Canary Brim, MD  polyethylene glycol (MIRALAX / GLYCOLAX) 17 g packet Take 17 g by mouth daily as needed. 09/08/22   Burnadette Pop, MD  promethazine (PHENERGAN) 25 MG tablet Take 1 tablet (25 mg total) by mouth every 6 (six) hours as needed for nausea or vomiting. 05/17/23   Tegeler, Canary Brim, MD  simvastatin (ZOCOR) 20 MG tablet Take 20 mg by mouth daily. 08/22/20   [provider]  sucralfate (CARAFATE) 1 g tablet Take 1 tablet (1 g total) by mouth 4 (four) times daily -  with meals and at bedtime. 02/24/23   Gerhard Munch, MD  sulfamethoxazole-trimethoprim (BACTRIM DS) 800-160 MG tablet Take 1 tablet by mouth 2 (two) times daily. 01/03/23   Bing Neighbors, NP  VITAMIN D PO Take 1 tablet by mouth daily.    [provider]      Allergies    Patient has no known allergies.    Review of Systems   Review of Systems  Musculoskeletal:  Positive for back pain.    Physical Exam Updated Vital Signs BP (!) 162/93 (BP Location: Right Arm)   Pulse 100   Temp 97.9 F (36.6 C) (Oral)   Resp 18   SpO2 99%  Physical Exam Constitutional:      General: She is not in acute distress. Cardiovascular:     Rate and Rhythm: Normal rate.     Pulses: Normal  pulses.  Musculoskeletal:     Comments: No midline tenderness or abnormalities palpated Tender to the right piriformis muscle Right-sided straight leg test positive 5 out of 5 bilateral hip flexion, knee extension/flexion, plantarflexion/dorsiflexion No midline tenderness or abnormalities palpated  Skin:    General: Skin is warm and dry.     Capillary Refill: Capillary refill takes less than 2 seconds.  Neurological:     Mental Status: She is alert.     Comments: Sensation intact distally Able to ambulate with walker which is baseline but does endorse pain when ambulating  Psychiatric:        Mood and Affect: Mood normal.     ED Results /  Procedures / Treatments   Labs (all labs ordered are listed, but only abnormal results are displayed) Labs Reviewed - No data to display  EKG None  Radiology No results found.  Procedures Procedures    Medications Ordered in ED Medications  lidocaine (LIDODERM) 5 % 1 patch (1 patch Transdermal Patch Applied 06/09/23 0853)  acetaminophen (TYLENOL) tablet 650 mg (has no administration in time range)  dexamethasone (DECADRON) injection 10 mg (has no administration in time range)    ED Course/ Medical Decision Making/ A&P                                 Medical Decision Making Risk OTC drugs. Prescription drug management.   [name] 57 y.o. presented today for back pain. Working DDx that I considered at this time includes, but not limited to, chronic sciatica, MSK, underlying fracture, epidural hematoma/abscess, cauda equina syndrome, spinal stenosis, spinal malignancy, discitis, spinal infection, spondylitises/ spondylosis, conus medullaris, DDD of the back.  R/o DDx: MSK, underlying fracture, epidural hematoma/abscess, cauda equina syndrome, spinal stenosis, spinal malignancy, discitis, spinal infection, spondylitises/ spondylosis, conus medullaris, DDD of the back : less likely due to history of present illness,  physical exam, labs/imaging findings.  Review of prior external notes: 06/02/2023 ED  Unique Tests and My Interpretation: None  Social Determinants of Health: none  Discussion with Independent Historian: None  Discussion of Management of Tests: None  Risk: Medium: prescription drug management  Risk Stratification Score: none  Staffed with Trifan, MD  Plan: On exam patient was in no acute distress with stable vitals. No neurological deficits and normal neuro exam.  Reports she has been walking at baseline with her walker and denied any new onset weakness.  No loss of bowel or bladder control.  No concern for cauda equina.  No fever, night sweats, weight loss, h/o cancer, IVDU.  Due to her history of diabetes and the risk of hurting her kidneys and to use Tylenol instead every 6 hours.  Will give patient lidocaine patch here along with Decadron shot.  Patient I had a long discussion as patient is diabetic and we agreed that since the steroids helped last time we can do the Decadron shot but that she would need to watch her sugars over the next few days to make sure that they would not be elevated.  Patient verbalized understanding acceptance of that.  I spoke to the patient as well about gabapentin and patient states she was on it a long time ago but is unsure if that helped.  Patient states that she would like to be prescribed this and so we will prescribe and refer her to a neurosurgeon for further management.  Patient was given return precautions. Patient stable for discharge at this time.  Patient verbalized understanding of plan.  This chart was dictated using voice recognition software.  Despite best efforts to proofread,  errors can occur which can change the documentation meaning.         Final Clinical Impression(s) / ED Diagnoses Final diagnoses:  Sciatica of right side    Rx / DC Orders ED Discharge Orders          Ordered    lidocaine (LIDODERM) 5 %  Every 24 hours         06/09/23 0853    gabapentin (NEURONTIN) 100 MG capsule  3 times daily        06/09/23  0981              Netta Corrigan, PA-C 06/09/23 0900    Terald Sleeper, MD 06/09/23 [phone]

## 2023-06-09 NOTE — Discharge Instructions (Addendum)
Please follow-up with your primary care provider and neurosurgeon I have attached your for you in regards to recent symptoms and ER visit.  Today your exam is consistent with your sciatica and you are given 1 dose of Decadron here along with lidocaine patch.  The Decadron is a steroid and may raise your sugar is over the next few days please be mindful of your sugars.  You may use Tylenol every 6 hours needed for pain.  Please do not use ibuprofen as with your diabetes this may injure your kidneys.  As we agreed I have prescribed for you gabapentin to take as well.  This is a neuropathic pain med.  If symptoms change or worsen please return to ER.

## 2023-07-03 ENCOUNTER — Ambulatory Visit
Admission: EM | Admit: 2023-07-03 | Discharge: 2023-07-03 | Disposition: A | Discharge status: Home / Self Care | Payer: 59 | Attending: Family Medicine | Admitting: Family Medicine

## 2023-07-03 DIAGNOSIS — R109 Unspecified abdominal pain: Secondary | ICD-10-CM | POA: Diagnosis not present

## 2023-07-03 DIAGNOSIS — R042 Hemoptysis: Secondary | ICD-10-CM | POA: Diagnosis not present

## 2023-07-03 NOTE — Discharge Instructions (Addendum)
You were seen today for blood in your sputum and abdominal pain.   We were unable to get any blood work from you today.  As a result,  if you have worsening abdominal pain, vomiting, or more blood, then please go to the ER for further evaluation.

## 2023-07-03 NOTE — ED Provider Notes (Addendum)
[facility]    CSN: [id number] Arrival date & time: 07/03/23  1027      History   Chief Complaint Chief Complaint  Patient presents with   Hemoptysis    HPI [name] is a 57 y.o. female.   Patient is here for not feeling well.  She got up to brush her teeth.  And when she gargled there was blood in the spit.  She feels burning the back of the throat.  She has been dx with pancreatitis, and not sure if related.  She is having some abdominal pain, but not the severity as she would with pancreatitis.  She is having chills, sweats.  Decreased appetite x 5 days, and worsening.  She is having n/v.  She vomited the day before yesterday.  She did a virtual visit with her dr, given zofran and promethazine.  Some urinary frequency, mostly at night.  She smokes marijuana, but last was several days.  No etoh use, no drug use.         Past Medical History:  Diagnosis Date   Anxiety    Arthritis    rheumatoid , osteoarthritis   Bronchitis    Chronic back pain    Depression    Diabetes mellitus    Fatty liver    Fibromyalgia    GERD (gastroesophageal reflux disease)    Headache    Hypertension    Pancreatitis    Pneumonia    Rotator cuff tear, right     Patient Active Problem List   Diagnosis Date Noted   GERD (gastroesophageal reflux disease) 08/14/2022   HLD (hyperlipidemia) 08/14/2022   Adrenal adenoma, left 09/13/2021   Intractable nausea and vomiting 09/12/2021   Anxiety 09/12/2021   Gastroenteritis 09/09/2021   Type 2 diabetes mellitus with obesity (HCC) 09/09/2021   Class 2 obesity due to excess calories with body mass index (BMI) of 36.0 to 36.9 in adult 09/09/2021   Marijuana abuse 09/09/2021   Tobacco dependence 09/09/2021   Essential hypertension 05/29/2020   Abdominal pain 11/23/2019   Acute pancreatitis 11/21/2019   De Quervain's syndrome (tenosynovitis) 07/06/2018   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   Complete tear of right  rotator cuff 07/22/2017    Class: Chronic   Retained orthopedic hardware 07/22/2017    Class: Chronic   Cervical disc herniation 07/21/2017    Class: Acute   Status post cervical spinal fusion 07/21/17 07/21/2017   Chronic back pain     Past Surgical History:  Procedure Laterality Date   ABDOMINAL HYSTERECTOMY     ANTERIOR CERVICAL DECOMP/DISCECTOMY FUSION N/A 07/21/2017   Procedure: ANTERIOR CERVICAL DISCECTOMY AND FUSION C6-7, REMOVAL OF SCREW C6 PLATE, DEPUY ZERO-P IMPLANT, LOCAL BONE GRAFT, ALLOGRAFT BONE GRAFT, VIVIGEN;  Surgeon: Kerrin Champagne, MD;  Location: [facility] OR;  Service: Orthopedics;  Laterality: N/A;   BACK SURGERY     laminectomy   BONE GRAFT HIP ILIAC CREST     BREAST BIOPSY     CARPAL TUNNEL RELEASE     CERVICAL DISCECTOMY  07/21/2017   CESAREAN SECTION     CHOLECYSTECTOMY     COLONOSCOPY     frontal fusion     neck fusion     patient reported   SHOULDER ARTHROSCOPY WITH SUBACROMIAL DECOMPRESSION, ROTATOR CUFF REPAIR AND BICEP TENDON REPAIR Right 10/31/2017   Procedure: RIGHT SHOULDER ARTHROSCOPY, MANIPULATION UNDER ANESTHESIA, BICEPS TENODESIS, AND MINI OPEN ROTATOR CUFF TEAR REPAIR;  Surgeon: Cammy Copa, MD;  Location: [facility] OR;  Service: Orthopedics;  Laterality: Right;    OB History   No obstetric history on file.      Home Medications    Prior to Admission medications   Medication Sig Start Date End Date Taking? Authorizing Provider  acetaminophen (TYLENOL) 500 MG tablet Take 500 mg by mouth 2 (two) times daily as needed for moderate pain.    [provider]  albuterol (PROVENTIL) (2.5 MG/3ML) 0.083% nebulizer solution Take 3 mLs (2.5 mg total) by nebulization every 6 (six) hours as needed for wheezing or shortness of breath. 12/12/19   Cathie Hoops, Amy V, PA-C  albuterol (VENTOLIN HFA) 108 (90 Base) MCG/ACT inhaler Inhale 2 puffs into the lungs every 4 (four) hours as needed for wheezing. 06/07/22   Tomi Bamberger, PA-C  ALPRAZolam Prudy Feeler) 1 MG  tablet Take 1 mg by mouth daily as needed for anxiety. 10/23/19   [provider]  amLODipine-olmesartan (AZOR) 5-40 MG tablet Take 1 tablet by mouth daily. 06/05/21   [provider]  aspirin EC 81 MG tablet Take 81 mg by mouth daily. Swallow whole.    [provider]  CREON 36000-114000 units CPEP capsule Take 36,000 Units by mouth daily. Patient not taking: Reported on 12/27/2021 10/16/21   [provider]  cyclobenzaprine (FLEXERIL) 10 MG tablet Take 1 tablet (10 mg total) by mouth 2 (two) times daily as needed for muscle spasms. 06/02/23   Tomi Bamberger, PA-C  dicyclomine (BENTYL) 10 MG capsule Take 10 mg by mouth daily. 10/16/21   [provider]  escitalopram (LEXAPRO) 20 MG tablet Take 20 mg by mouth daily. 08/10/20   [provider]  fluticasone (FLONASE) 50 MCG/ACT nasal spray Place 1 spray into both nostrils daily as needed for allergies or rhinitis.    [provider]  gabapentin (NEURONTIN) 100 MG capsule Take 1 capsule (100 mg total) by mouth 3 (three) times daily. 06/09/23 07/09/23  Netta Corrigan, PA-C  ibuprofen (ADVIL) 800 MG tablet Take 1 tablet (800 mg total) by mouth every 8 (eight) hours as needed (pain). Patient not taking: Reported on 09/05/2022 12/27/21   Zenia Resides, MD  insulin aspart (NOVOLOG) 100 UNIT/ML FlexPen Inject 0-9 Units into the skin 3 (three) times daily with meals. CBG 70 - 120: 0 units ,CBG 121 - 150:1 unit  ,CBG 151 - 200:2 units, CBG 201 - 250:3 units,CBG 251 - 300:5 units,CBG 301 - 350: 7 units ,CBG 351 - 400:9 units,CBG > 400 call MD and obtain STAT lab verification 09/08/22   Burnadette Pop, MD  insulin degludec (TRESIBA) 100 UNIT/ML FlexTouch Pen Inject 30 Units into the skin daily. 09/08/22   Burnadette Pop, MD  Insulin Pen Needle 32G X 4 MM MISC 1 each by Does not apply route 3 (three) times daily. 09/08/22   Burnadette Pop, MD  lidocaine (LIDODERM) 5 % Place 1 patch onto the skin daily.  Remove & Discard patch within 12 hours or as directed by MD 06/09/23   Netta Corrigan, PA-C  metoCLOPramide (REGLAN) 10 MG tablet Take 1 tablet (10 mg total) by mouth every 8 (eight) hours as needed for nausea. 09/08/22   Burnadette Pop, MD  metoprolol succinate (TOPROL-XL) 50 MG 24 hr tablet Take 50 mg by mouth daily. 07/21/14   [provider]  nicotine (NICODERM CQ - DOSED IN MG/24 HOURS) 14 mg/24hr patch Place 1 patch (14 mg total) onto the skin daily. 09/09/22   Burnadette Pop, MD  ondansetron (ZOFRAN-ODT) 4 MG  disintegrating tablet Take 1 tablet (4 mg total) by mouth every 8 (eight) hours as needed for nausea or vomiting. 03/02/23   Sabas Sous, MD  oxyCODONE-acetaminophen (PERCOCET/ROXICET) 5-325 MG tablet Take 1 tablet by mouth every 4 (four) hours as needed for severe pain. 05/17/23   Tegeler, Canary Brim, MD  polyethylene glycol (MIRALAX / GLYCOLAX) 17 g packet Take 17 g by mouth daily as needed. 09/08/22   Burnadette Pop, MD  promethazine (PHENERGAN) 25 MG tablet Take 1 tablet (25 mg total) by mouth every 6 (six) hours as needed for nausea or vomiting. 05/17/23   Tegeler, Canary Brim, MD  simvastatin (ZOCOR) 20 MG tablet Take 20 mg by mouth daily. 08/22/20   [provider]  sucralfate (CARAFATE) 1 g tablet Take 1 tablet (1 g total) by mouth 4 (four) times daily -  with meals and at bedtime. 02/24/23   Gerhard Munch, MD  sulfamethoxazole-trimethoprim (BACTRIM DS) 800-160 MG tablet Take 1 tablet by mouth 2 (two) times daily. 01/03/23   Bing Neighbors, NP  VITAMIN D PO Take 1 tablet by mouth daily.    [provider]    Family History Family History  Problem Relation Age of Onset   Hypertension Father    Renal Disease Father    Diabetes Mother    Hypertension Mother    Edema Mother    Diabetes Sister    Diabetes Brother     Social History Social History   Tobacco Use   Smoking status: Every Day    Current packs/day: 0.50    Average  packs/day: 0.5 packs/day for 40.0 years (20.0 ttl pk-yrs)    Types: Cigarettes   Smokeless tobacco: Never  Vaping Use   Vaping status: Never Used  Substance Use Topics   Alcohol use: No   Drug use: Yes    Types: Marijuana    Comment: 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   Review of Systems Review of Systems  Constitutional: Negative.   HENT: Negative.    Respiratory: Negative.    Cardiovascular: Negative.   Gastrointestinal:  Positive for abdominal pain.  Musculoskeletal: Negative.   Psychiatric/Behavioral: Negative.       Physical Exam Triage Vital Signs ED Triage Vitals  Encounter Vitals Group     BP 07/03/23 1008 129/85     Systolic BP Percentile --      Diastolic BP Percentile --      Pulse --      Resp 07/03/23 1008 16     Temp 07/03/23 1008 98.4 F (36.9 C)     Temp Source 07/03/23 1008 Oral     SpO2 07/03/23 1008 99 %     Weight --      Height --      Head Circumference --      Peak Flow --      Pain Score 07/03/23 1011 0     Pain Loc --      Pain Education --      Exclude from Growth Chart --    No data found.  Updated Vital Signs BP 129/85 (BP Location: Left Arm)   Temp 98.4 F (36.9 C) (Oral)   Resp 16   SpO2 99%   Visual Acuity Right Eye Distance:   Left Eye Distance:   Bilateral Distance:    Right Eye Near:  Left Eye Near:    Bilateral Near:     Physical Exam Constitutional:      General: She is not in acute distress.    Appearance: Normal appearance.  HENT:     Nose: Nose normal.     Mouth/Throat:     Mouth: Mucous membranes are moist.     Dentition: Normal dentition.     Tongue: No lesions.     Pharynx: No oropharyngeal exudate or posterior oropharyngeal erythema.     Comments: No obvious bleeding/blood in the mouth or oropharynx Cardiovascular:     Rate and Rhythm: Normal rate and regular rhythm.  Pulmonary:      Effort: Pulmonary effort is normal.     Breath sounds: Normal breath sounds. No wheezing or rhonchi.  Abdominal:     Palpations: Abdomen is soft.     Tenderness: There is abdominal tenderness.     Comments: Patient has TTP across the upper abdomen, most notably at the LUQ and epigastric area  Musculoskeletal:     Cervical back: Normal range of motion and neck supple. No tenderness.  Lymphadenopathy:     Cervical: Cervical adenopathy present.  Neurological:     Mental Status: She is alert.      UC Treatments / Results  Labs (all labs ordered are listed, but only abnormal results are displayed) Labs Reviewed  CBC WITH DIFFERENTIAL/PLATELET  COMPREHENSIVE METABOLIC PANEL  LIPASE    EKG   Radiology No results found.  Procedures Procedures (including critical care time)  Medications Ordered in UC Medications - No data to display  Initial Impression / Assessment and Plan / UC Course  I have reviewed the triage vital signs and the nursing notes.  Pertinent labs & imaging results that were available during my care of the patient were reviewed by me and considered in my medical decision making (see chart for details).  Patient seen today for blood in her sputum, and abdominal pain. She does have h/o pancreatitis, but no GI bleed or ETOH use.   No obvious source of bleeding from the mouth. She is very tender on abdominal exam.  I recommended she go to the ER for evaluation.  She is very hesitant about this today. She elects for blood work today first, and if abnormal then go to the ER.  However, we were unable to get blood from her today.  She has elected to go home and monitor, and go to the ER if worsening symptoms.    Final Clinical Impressions(s) / UC Diagnoses   Final diagnoses:  Abdominal pain, unspecified abdominal location  Blood in sputum     Discharge Instructions      You were seen today for blood in your sputum and abdominal pain.  You have elected to check  lab work first, and then decide where to go from there.  This will be resulted by tomorrow and if abnormal you will be notified.  In the mean time, if you have worsening abdominal pain, vomiting, or more blood, then please go to the ER for further evaluation.      ED Prescriptions   None    PDMP not reviewed this encounter.   Jannifer Franklin, MD 07/03/23 1038    Jannifer Franklin, MD 07/03/23 1110

## 2023-07-03 NOTE — ED Triage Notes (Signed)
Pt states this morning after brushing her teeth she spit up blood.  States she doesn't feel herself.

## 2023-07-16 ENCOUNTER — Ambulatory Visit: Payer: 59 | Admitting: Orthopedic Surgery

## 2023-07-16 ENCOUNTER — Other Ambulatory Visit (INDEPENDENT_AMBULATORY_CARE_PROVIDER_SITE_OTHER): Payer: 59

## 2023-07-16 ENCOUNTER — Encounter: Payer: Self-pay | Admitting: Orthopedic Surgery

## 2023-07-16 VITALS — BP 135/80 | HR 111 | Ht 64.0 in | Wt 233.0 lb

## 2023-07-16 DIAGNOSIS — M5416 Radiculopathy, lumbar region: Secondary | ICD-10-CM | POA: Diagnosis not present

## 2023-07-16 DIAGNOSIS — M545 Low back pain, unspecified: Secondary | ICD-10-CM | POA: Diagnosis not present

## 2023-07-16 MED ORDER — DICLOFENAC SODIUM 50 MG PO TBEC: 50.0000 mg | DELAYED_RELEASE_TABLET | Freq: Two times a day (BID) | ORAL | 0 refills | Status: DC

## 2023-07-16 NOTE — Progress Notes (Addendum)
Orthopedic Spine Surgery Office Note  Assessment: Patient is a 57 y.o. female with low back pain that radiates into the bilateral lower extremities.  She feels it goes along the anterior thigh and into the anterior leg on the right.  On the left, she feels it goes into the anterior thigh   Plan: -Patient has tried PT, Tylenol, oral steroids, steroid injections, gabapentin -Prescribed diclofenac that she can use in addition to Tylenol.  I told her to take it with plenty of water and food -Patient has tried conservative treatment for over 3 months now, so recommended MRI of the lumbar spine to evaluate for radiculopathy -I explained that some of her groin pain seems to be coming from her hip based on her physical exam but it does not explain any of the pain radiating down her legs or her left-sided symptoms which seem more consistent with radiculopathy -Need to be nicotine free and have an A1c of 7.5 or less prior to any elective spine surgery -Patient should return to office in 4 weeks, x-rays at next visit: lateral lumbar with hips in view   Patient expressed understanding of the plan and all questions were answered to the patient's satisfaction.   ___________________________________________________________________________   History:  Patient is a 57 y.o. female who presents today for lumbar spine.  Patient has had about a year of low back pain that radiates into the bilateral lower extremities.  She feels it is more significant on the right side.  It goes into the right anterior thigh and into the anterior leg.  On the left side, she feels it in the anterior thigh.  It does not radiate past the knee on the left side.  There is no trauma or injury that preceded the onset of pain.  She notes the pain on a daily basis.  She feels the pain with activity and at rest.  Pain has gotten significantly worse within the last 3 months.   Weakness: Yes, sometimes her right leg feels weaker.  No other  weakness noted Symptoms of imbalance: Denies Paresthesias and numbness: Denies Bowel or bladder incontinence: Denies Saddle anesthesia: Denies  Treatments tried: PT, Tylenol, oral steroids, steroid injections, gabapentin  Review of systems: Denies fevers and chills, night sweats, unexplained weight loss, history of cancer.  Has had pain that wakes her at night  Past medical history: HTN Fibromyalgia Depression/anxiety GERD DM Chronic pain  Allergies: NKDA   Past surgical history:  L4/5 and L5/S1 PLIF L5/S1 anterior lumbar surgery for pseudoarthrosis Hysterectomy Carpal tunnel release Right shoulder rotator cuff repair  Social history: Reports use of nicotine product (smoking, vaping, patches, smokeless) Alcohol use: denies Reports marijuana use, denies other recreational drug use   Physical Exam:  BMI of 39.1  General: no acute distress, appears stated age Neurologic: alert, answering questions appropriately, following commands Respiratory: unlabored breathing on room air, symmetric chest rise Psychiatric: appropriate affect, normal cadence to speech   MSK (spine):  -Strength exam      Left  Right EHL    5/5  5/5 TA    5/5  5/5 GSC    5/5  5/5 Knee extension  5/5  5/5 Hip flexion   5/5  4/5  -Sensory exam    Sensation intact to light touch in L3-S1 nerve distributions of bilateral lower extremities  -Achilles DTR: 2/4 on the left, 2/4 on the right -Patellar tendon DTR: 2/4 on the left, 2/4 on the right  -Straight leg raise: negative bilaterally -Femoral  nerve stretch test: negative bilaterally -Clonus: no beats bilaterally  -Left hip exam: No pain through range of motion, negative FADIR, negative FABER, negative SI joint compression test, negative Stinchfield -Right hip exam: Positive FADIR and pain with internal rotation but no other pain through range of motion, negative FABER, negative SI joint compression test, positive  Stinchfield  Imaging: XRs of the lumbar spine from 07/16/2023 was independently reviewed and interpreted, showing interbody devices at L4/5 and L5/S1. Interbodies appear in appropriate position. Grade 1 spondylolisthesis at L3/4. No fracture or dislocation seen. PI of 70, LL of 46.   CT of the abdomen and pelvis from 02/24/2023 was independently reviewed and interpreted, showing no lumbar spine fracture or dislocation. There appears to be bridging bone across the former L4/5 and L5/S1 disc spaces where there are interbody devices. No lucency around the interbodies.    Patient name: [name] Patient MRN: [id number] Date of visit: 07/16/23

## 2023-07-29 ENCOUNTER — Ambulatory Visit
Admission: EM | Admit: 2023-07-29 | Discharge: 2023-07-29 | Disposition: A | Discharge status: Home / Self Care | Payer: 59

## 2023-07-29 DIAGNOSIS — L0291 Cutaneous abscess, unspecified: Secondary | ICD-10-CM

## 2023-07-29 MED ORDER — DOXYCYCLINE HYCLATE 100 MG PO CAPS
100.0000 mg | ORAL_CAPSULE | Freq: Two times a day (BID) | ORAL | 0 refills | Status: AC
Start: 2023-07-29 — End: 2023-08-05

## 2023-07-29 NOTE — ED Triage Notes (Signed)
"  I have a boil in groin area and I may have something in my left index finger as well that I need looked at, no fever, no injury".

## 2023-08-07 NOTE — ED Provider Notes (Signed)
[facility]    CSN: [id number] Arrival date & time: 07/29/23  1112      History   Chief Complaint Chief Complaint  Patient presents with   Skin Problem    HPI [name] is a 57 y.o. female.   Patient here today for evaluation of an abscess in her groin area.  She also reports that she has had possibly something in her left index finger.  She denies any injury and has not had any fever.  The history is provided by the patient.    Past Medical History:  Diagnosis Date   Anxiety    Arthritis    rheumatoid , osteoarthritis   Bronchitis    Chronic back pain    Depression    Diabetes mellitus    Fatty liver    Fibromyalgia    GERD (gastroesophageal reflux disease)    Headache    Hypertension    Pancreatitis    Pneumonia    Rotator cuff tear, right     Patient Active Problem List   Diagnosis Date Noted   Severe obesity (BMI 35.0-35.9 with comorbidity) (HCC) 06/16/2023   GERD (gastroesophageal reflux disease) 08/14/2022   HLD (hyperlipidemia) 08/14/2022   Pain of upper abdomen 06/02/2022   Adrenal adenoma, left 09/13/2021   Intractable nausea and vomiting 09/12/2021   Anxiety 09/12/2021   Gastroenteritis 09/09/2021   Type 2 diabetes mellitus with obesity (HCC) 09/09/2021   Class 2 obesity due to excess calories with body mass index (BMI) of 36.0 to 36.9 in adult 09/09/2021   Marijuana abuse 09/09/2021   Tobacco dependence 09/09/2021   Essential hypertension 05/29/2020   Abdominal pain 11/23/2019   Acute pancreatitis 11/21/2019   De Quervain's syndrome (tenosynovitis) 07/06/2018   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   Pain 07/06/2018   Complete tear of right rotator cuff 07/22/2017    Class: Chronic   Retained orthopedic hardware 07/22/2017    Class: Chronic   Cervical disc herniation 07/21/2017    Class: Acute   Status post cervical spinal fusion 07/21/17 07/21/2017   Chronic back pain     Past Surgical History:  Procedure Laterality  Date   ABDOMINAL HYSTERECTOMY     ANTERIOR CERVICAL DECOMP/DISCECTOMY FUSION N/A 07/21/2017   Procedure: ANTERIOR CERVICAL DISCECTOMY AND FUSION C6-7, REMOVAL OF SCREW C6 PLATE, DEPUY ZERO-P IMPLANT, LOCAL BONE GRAFT, ALLOGRAFT BONE GRAFT, VIVIGEN;  Surgeon: Kerrin Champagne, MD;  Location: [facility] OR;  Service: Orthopedics;  Laterality: N/A;   BACK SURGERY     laminectomy   BONE GRAFT HIP ILIAC CREST     BREAST BIOPSY     CARPAL TUNNEL RELEASE     CERVICAL DISCECTOMY  07/21/2017   CESAREAN SECTION     CHOLECYSTECTOMY     COLONOSCOPY     frontal fusion     neck fusion     patient reported   SHOULDER ARTHROSCOPY WITH SUBACROMIAL DECOMPRESSION, ROTATOR CUFF REPAIR AND BICEP TENDON REPAIR Right 10/31/2017   Procedure: RIGHT SHOULDER ARTHROSCOPY, MANIPULATION UNDER ANESTHESIA, BICEPS TENODESIS, AND MINI OPEN ROTATOR CUFF TEAR REPAIR;  Surgeon: Cammy Copa, MD;  Location: [facility] OR;  Service: Orthopedics;  Laterality: Right;    OB History   No obstetric history on file.      Home Medications    Prior to Admission medications   Medication Sig Start Date End Date Taking? Authorizing Provider  ACCU-CHEK GUIDE TEST test strip 1 each by Other route as needed. 06/16/23 06/15/24 Yes [provider]  albuterol (VENTOLIN HFA) 108 (90 Base) MCG/ACT inhaler Inhale 2 puffs into the lungs every 4 (four) hours as needed for wheezing. Patient taking differently: Inhale 2 puffs into the lungs every 4 (four) hours as needed for wheezing. Sunday. 06/07/22  Yes Tomi Bamberger, PA-C  ALPRAZolam Prudy Feeler) 1 MG tablet Take 1 mg by mouth daily as needed for anxiety. 10/23/19  Yes [provider]  amLODipine-olmesartan (AZOR) 5-40 MG tablet Take 1 tablet by mouth daily. 06/05/21  Yes [provider]  aspirin EC 81 MG tablet Take 81 mg by mouth daily. Swallow whole.   Yes [provider]  Blood Glucose Monitoring Suppl (ACCU-CHEK GUIDE) w/Device KIT See admin instructions. 06/16/23   Yes [provider]  Continuous Glucose Sensor (FREESTYLE LIBRE 3 PLUS SENSOR) MISC Change every 15 days 06/16/23  Yes [provider]  Continuous Glucose Sensor (FREESTYLE LIBRE 3 SENSOR) MISC USE SENSOR AND REPLACE EVERY 14 DAYS 05/27/23  Yes [provider]  cyclobenzaprine (FLEXERIL) 10 MG tablet Take 1 tablet (10 mg total) by mouth 2 (two) times daily as needed for muscle spasms. Patient taking differently: Take 10 mg by mouth 2 (two) times daily as needed for muscle spasms. Sunday. 06/02/23  Yes Tomi Bamberger, PA-C  diclofenac (VOLTAREN) 50 MG EC tablet Take 1 tablet (50 mg total) by mouth 2 (two) times daily. 07/16/23  Yes London Sheer, MD  escitalopram (LEXAPRO) 20 MG tablet Take 20 mg by mouth daily. 08/10/20  Yes [provider]  HUMALOG MIX 75/25 KWIKPEN (75-25) 100 UNIT/ML KwikPen Inject into the skin as directed.   Yes [provider]  lidocaine (LIDODERM) 5 % Place 1 patch onto the skin daily. Remove & Discard patch within 12 hours or as directed by MD 06/09/23  Yes Schuman, Beverly Gust, PA-C  metoCLOPramide (REGLAN) 10 MG tablet Take 1 tablet (10 mg total) by mouth every 8 (eight) hours as needed for nausea. 09/08/22  Yes Burnadette Pop, MD  metoprolol succinate (TOPROL-XL) 50 MG 24 hr tablet Take 50 mg by mouth daily. 07/21/14  Yes [provider]  ondansetron (ZOFRAN-ODT) 4 MG disintegrating tablet Take 1 tablet (4 mg total) by mouth every 8 (eight) hours as needed for nausea or vomiting. 03/02/23  Yes Bero, Elmer Sow, MD  oxyCODONE-acetaminophen (PERCOCET/ROXICET) 5-325 MG tablet Take 1 tablet by mouth every 4 (four) hours as needed for severe pain. 05/17/23  Yes Tegeler, Canary Brim, MD  pantoprazole (PROTONIX) 40 MG tablet Take 40 mg by mouth 2 (two) times daily. 01/15/22  Yes [provider]  promethazine (PHENERGAN) 25 MG tablet Take 1 tablet (25 mg total) by mouth every 6 (six) hours as needed for nausea or vomiting.  05/17/23  Yes Tegeler, Canary Brim, MD  simvastatin (ZOCOR) 20 MG tablet Take 20 mg by mouth daily. 08/22/20  Yes [provider]  Accu-Chek Softclix Lancets lancets by Other route 2 (two) times daily.    [provider]  acetaminophen (TYLENOL) 500 MG tablet Take 500 mg by mouth 2 (two) times daily as needed for moderate pain.    [provider]  albuterol (PROVENTIL) (2.5 MG/3ML) 0.083% nebulizer solution Take 3 mLs (2.5 mg total) by nebulization every 6 (six) hours as needed for wheezing or shortness of breath. 12/12/19   Cathie Hoops, Amy V, PA-C  amitriptyline (ELAVIL) 10 MG tablet Take 10 mg by mouth as directed. 03/30/18   [provider]  BAQSIMI TWO PACK 3 MG/DOSE POWD Place into both nostrils.  [provider]  CREON 36000-114000 units CPEP capsule Take 36,000 Units by mouth daily. Patient not taking: Reported on 12/27/2021 10/16/21   [provider]  dicyclomine (BENTYL) 10 MG capsule Take 10 mg by mouth daily. 10/16/21   [provider]  Ferrous Sulfate (IRON PO) Take by mouth.    [provider]  fluticasone (FLONASE) 50 MCG/ACT nasal spray Place 1 spray into both nostrils daily as needed for allergies or rhinitis.    [provider]  gabapentin (NEURONTIN) 100 MG capsule Take 1 capsule (100 mg total) by mouth 3 (three) times daily. 06/09/23 07/09/23  Netta Corrigan, PA-C  Insulin Pen Needle 32G X 4 MM MISC 1 each by Does not apply route 3 (three) times daily. 09/08/22   Burnadette Pop, MD  nicotine (NICODERM CQ - DOSED IN MG/24 HOURS) 14 mg/24hr patch Place 1 patch (14 mg total) onto the skin daily. 09/09/22   Burnadette Pop, MD  polyethylene glycol (MIRALAX / GLYCOLAX) 17 g packet Take 17 g by mouth daily as needed. 09/08/22   Burnadette Pop, MD  sucralfate (CARAFATE) 1 g tablet Take 1 tablet (1 g total) by mouth 4 (four) times daily -  with meals and at bedtime. 02/24/23   Gerhard Munch, MD    Family  History Family History  Problem Relation Age of Onset   Hypertension Father    Renal Disease Father    Diabetes Mother    Hypertension Mother    Edema Mother    Diabetes Sister    Diabetes Brother     Social History Social History   Tobacco Use   Smoking status: Every Day    Current packs/day: 0.50    Average packs/day: 0.5 packs/day for 40.0 years (20.0 ttl pk-yrs)    Types: Cigarettes   Smokeless tobacco: Never  Vaping Use   Vaping status: Never Used  Substance Use Topics   Alcohol use: No   Drug use: Yes    Types: Marijuana    Comment: 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   Review of Systems Review of Systems  Constitutional:  Negative for chills and fever.  Eyes:  Negative for discharge and redness.  Gastrointestinal:  Negative for abdominal pain, nausea and vomiting.  Skin:  Positive for color change. Negative for wound.     Physical Exam Triage Vital Signs ED Triage Vitals  Encounter Vitals Group     BP 07/29/23 1210 126/69     Systolic BP Percentile --      Diastolic BP Percentile --      Pulse Rate 07/29/23 1210 63     Resp 07/29/23 1210 18     Temp 07/29/23 1210 98.5 F (36.9 C)     Temp Source 07/29/23 1210 Oral     SpO2 07/29/23 1210 96 %     Weight 07/29/23 1202 233 lb 0.4 oz (105.7 kg)     Height 07/29/23 1202 5\' 4"  (1.626 m)     Head Circumference --      Peak Flow --      Pain Score 07/29/23 1159 10     Pain Loc --      Pain Education --      Exclude from Growth Chart --    No data found.  Updated Vital Signs BP 126/69 (BP Location: Left Arm)   Pulse 63  Temp 98.5 F (36.9 C) (Oral)   Resp 18   Ht 5\' 4"  (1.626 m)   Wt 233 lb 0.4 oz (105.7 kg)   SpO2 96%   BMI 40.00 kg/m   Visual Acuity Right Eye Distance:   Left Eye Distance:   Bilateral Distance:    Right Eye Near:   Left Eye Near:    Bilateral Near:      Physical Exam Vitals and nursing note reviewed.  Constitutional:      General: She is not in acute distress.    Appearance: Normal appearance. She is not ill-appearing.  HENT:     Head: Normocephalic and atraumatic.  Eyes:     Conjunctiva/sclera: Conjunctivae normal.  Cardiovascular:     Rate and Rhythm: Normal rate.  Pulmonary:     Effort: Pulmonary effort is normal.  Skin:    Comments: Minimal swelling and induration to right index finger without erythema.  No wound appreciated.  Small area of induration and swelling to groin area without active drainage.  No fluctuance appreciated  Neurological:     Mental Status: She is alert.  Psychiatric:        Mood and Affect: Mood normal.        Behavior: Behavior normal.        Thought Content: Thought content normal.      UC Treatments / Results  Labs (all labs ordered are listed, but only abnormal results are displayed) Labs Reviewed - No data to display  EKG   Radiology No results found.  Procedures Procedures (including critical care time)  Medications Ordered in UC Medications - No data to display  Initial Impression / Assessment and Plan / UC Course  I have reviewed the triage vital signs and the nursing notes.  Pertinent labs & imaging results that were available during my care of the patient were reviewed by me and considered in my medical decision making (see chart for details).    Will treat abscess with doxycycline.  Discussed that this would also help with any possible infection of finger but no foreign body appreciated on exam.  Recommended follow-up if any symptoms worsen or with any new symptoms.  Patient expressed understanding.  Final Clinical Impressions(s) / UC Diagnoses   Final diagnoses:  Abscess   Discharge Instructions   None    ED Prescriptions     Medication Sig Dispense Auth. Provider   doxycycline (VIBRAMYCIN) 100 MG capsule Take 1 capsule (100 mg total) by mouth 2 (two) times  daily for 7 days. 14 capsule Tomi Bamberger, PA-C      PDMP not reviewed this encounter.   Tomi Bamberger, PA-C 08/07/23 1811

## 2023-08-09 ENCOUNTER — Other Ambulatory Visit (HOSPITAL_COMMUNITY): Payer: Self-pay

## 2023-08-09 MED ORDER — INSULIN LISPRO PROT & LISPRO (75-25 MIX) 100 UNIT/ML KWIKPEN
PEN_INJECTOR | SUBCUTANEOUS | 5 refills | Status: AC
  Filled 2023-08-09: qty 9, 6d supply, fill #0

## 2023-08-14 ENCOUNTER — Other Ambulatory Visit (INDEPENDENT_AMBULATORY_CARE_PROVIDER_SITE_OTHER): Payer: Self-pay

## 2023-08-14 ENCOUNTER — Ambulatory Visit: Payer: 59 | Admitting: Orthopedic Surgery

## 2023-08-14 DIAGNOSIS — M545 Low back pain, unspecified: Secondary | ICD-10-CM

## 2023-08-14 NOTE — Progress Notes (Signed)
 Orthopedic Spine Surgery Office Note   Assessment: [name] with low back pain that radiates into the bilateral lower extremities.  She feels it goes along the anterior thigh and into the anterior leg on the right.  On the left, she feels it goes into the anterior thigh. No changes since our last visit     Plan: -Patient has tried PT, Tylenol , diclofenac , oral steroids, steroid injections, gabapentin  -She is scheduled to have her MRI on 08/22/2023 so I will see her after that. She can continue with tylenol  and diclofenac  -Need to be nicotine  free and have an A1c of 7.5 or less prior to any elective spine surgery -Patient should return to office in 4 weeks, x-rays at next visit: lateral lumbar with hips in view     Patient expressed understanding of the plan and all questions were answered to the patient's satisfaction.    ___________________________________________________________________________     History:   [name] who presents today for follow up on her lumbar spine.  Patient is still having back pain that radiates into her bilateral lower extremities.  On the right side, she notices it going into the anterior thigh and into the anterior leg.  On the left side it just goes into the anterior thigh.  Her pain is more significant on the right side.  Pain is similar to the last time I saw her in terms of intensity.  She has not developed any new symptoms since she was last seen.   Treatments tried: PT, Tylenol , diclofenac , oral steroids, steroid injections, gabapentin      Physical Exam:   General: no acute distress, appears stated age Neurologic: alert, answering questions appropriately, following commands Respiratory: unlabored breathing on room air, symmetric chest rise Psychiatric: appropriate affect, normal cadence to speech     MSK (spine):   -Strength exam                                                   Left                   Right EHL                              5/5                  5/5 TA                                 5/5                  5/5 GSC                             5/5                  5/5 Knee extension            5/5                  5/5 Hip flexion                    5/5  4/5   -Sensory exam                           Sensation intact to light touch in L3-S1 nerve distributions of bilateral lower extremities   -Achilles DTR: 2/4 on the left, 2/4 on the right -Patellar tendon DTR: 2/4 on the left, 2/4 on the right   -Straight leg raise: negative bilaterally -Femoral nerve stretch test: negative bilaterally -Clonus: no beats bilaterally   -Left hip exam: No pain through range of motion, negative FADIR, negative FABER, negative SI joint compression test, negative Stinchfield -Right hip exam: Positive FADIR and pain with internal rotation but no other pain through range of motion, negative FABER, negative SI joint compression test, negative Stinchfield   Imaging: XR of the lumbar spine from 08/14/2023 was independently reviewed and interpreted, showing interbody devices at L4/5 and L5/S1. Interbodies appear in appropriate position. No lucency seen around the interbodies. Grade 1 spondylolisthesis at L3/4. No fracture or dislocation seen. PI of 70, LL of 46.    CT of the abdomen and pelvis from 02/24/2023 was independently reviewed and interpreted, showing no lumbar spine fracture or dislocation. There appears to be bridging bone across the former L4/5 and L5/S1 disc spaces where there are interbody devices. No lucency around the interbodies.      Patient name: [name] Patient MRN: [id number] Date of visit: 08/14/23

## 2023-08-18 ENCOUNTER — Other Ambulatory Visit: Payer: Self-pay | Admitting: Orthopedic Surgery

## 2023-08-22 ENCOUNTER — Ambulatory Visit
Admission: RE | Admit: 2023-08-22 | Discharge: 2023-08-22 | Disposition: A | Discharge status: Home / Self Care | Payer: 59 | Source: Ambulatory Visit | Attending: Orthopedic Surgery | Admitting: Orthopedic Surgery

## 2023-08-22 DIAGNOSIS — M5416 Radiculopathy, lumbar region: Secondary | ICD-10-CM

## 2023-08-25 ENCOUNTER — Ambulatory Visit
Admission: EM | Admit: 2023-08-25 | Discharge: 2023-08-25 | Disposition: A | Discharge status: Home / Self Care | Payer: 59 | Attending: Family Medicine | Admitting: Family Medicine

## 2023-08-25 ENCOUNTER — Encounter: Payer: Self-pay | Admitting: *Deleted

## 2023-08-25 ENCOUNTER — Other Ambulatory Visit: Payer: Self-pay

## 2023-08-25 DIAGNOSIS — M5441 Lumbago with sciatica, right side: Secondary | ICD-10-CM

## 2023-08-25 MED ORDER — DEXAMETHASONE SODIUM PHOSPHATE 10 MG/ML IJ SOLN
10.0000 mg | Freq: Once | INTRAMUSCULAR | Status: AC
  Administered 2023-08-25: 10 mg via INTRAMUSCULAR

## 2023-08-25 MED ORDER — KETOROLAC TROMETHAMINE 60 MG/2ML IM SOLN
60.0000 mg | Freq: Once | INTRAMUSCULAR | Status: AC
  Administered 2023-08-25: 60 mg via INTRAMUSCULAR

## 2023-08-25 NOTE — ED Provider Notes (Signed)
 [facility]   [id number] 08/25/23 Arrival Time: 1025  ASSESSMENT & PLAN:  1. Acute right-sided low back pain with right-sided sciatica    Acute on chronic pain. Able to ambulate here and hemodynamically stable.  Already reports some relief after: Meds ordered this encounter  Medications   dexamethasone  (DECADRON ) injection 10 mg   ketorolac  (TORADOL ) injection 60 mg   Encourage ROM/movement as tolerated.  Recommend:  Follow-up Information     Georgina Ozell LABOR, MD.   Specialty: Orthopedic Surgery Why: If worsening or failing to improve as anticipated. Contact information: [address] [place] [address] [phone]                 Reviewed expectations re: course of current medical issues. Questions answered. Outlined signs and symptoms indicating need for more acute intervention. Patient verbalized understanding. After Visit Summary given.   SUBJECTIVE: History from: patient.  [name] is a 58 y.o. female who presents with complaint of R LBP with R-sided sciatic pain. Long-standing h/o this. Had MRI 3 days ago and is in the process of scheduling surgery. Her PCP couldn't see her today. Taking flexeril , tylenol  and diclofenac  without relief. Denies changes in bowel/bladder habit changes.   OBJECTIVE:  Vitals:   08/25/23 1106  BP: (!) 147/88  Pulse: (!) 115  Resp: 18  Temp: 98 F (36.7 C)  TempSrc: Oral  SpO2: 94%    Tachycardia noted.  General appearance: alert; no distress but appears uncomfortable HEENT: [place]; AT Neck: supple with FROM; without midline tenderness CV: regular Lungs: unlabored respirations; speaks full sentences without difficulty Abdomen: soft, non-tender; non-distended Back: no specific lower back TTP but reports pain is dull and deep inside; FROM at waist; bruising: none; without midline tenderness Extremities: without edema; symmetrical without gross deformities; normal ROM of bilateral LE Skin:  warm and dry Neurologic: normal gait; normal sensation and strength of bilateral LE Psychological: alert and cooperative; normal mood and affect   No Known Allergies  Past Medical History:  Diagnosis Date   Anxiety    Arthritis    rheumatoid , osteoarthritis   Bronchitis    Chronic back pain    Depression    Diabetes mellitus    Fatty liver    Fibromyalgia    GERD (gastroesophageal reflux disease)    Headache    Hypertension    Pancreatitis    Pneumonia    Rotator cuff tear, right    Social History   Socioeconomic History   Marital status: Legally Separated    Spouse name: Not on file   Number of children: Not on file   Years of education: Not on file   Highest education level: Not on file  Occupational History   Occupation: disabled  Tobacco Use   Smoking status: Every Day    Current packs/day: 0.50    Average packs/day: 0.5 packs/day for 40.0 years (20.0 ttl pk-yrs)    Types: Cigarettes   Smokeless tobacco: Never  Vaping Use   Vaping status: Never Used  Substance and Sexual Activity   Alcohol use: No   Drug use: Yes    Types: Marijuana    Comment: 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 so stopped smoking THC, last use maybe 6 weeks ago   Sexual activity: Not on file  Other Topics Concern   Not on file  Social History Narrative   Not on file   Social Drivers of Health   Financial Resource Strain: Not on file  Food Insecurity: No Food Insecurity (09/05/2022)   Hunger Vital Sign    Worried About Running Out of Food in the Last Year: Never true    Ran Out of Food in the Last Year: Never true  Transportation Needs: No Transportation Needs (09/05/2022)   PRAPARE - Administrator, Civil Service (Medical): No    Lack of Transportation (Non-Medical): No  Physical Activity: Not on file  Stress: Not on file  Social Connections: Unknown (03/21/2023)   Received from [facility]   Social Network    Social Network: Not on file  Intimate Partner  Violence: Not At Risk (03/21/2023)   Received from [facility]   HITS    Over the last 12 months how often did your partner physically hurt you?: Never    Over the last 12 months how often did your partner insult you or talk down to you?: Never    Over the last 12 months how often did your partner threaten you with physical harm?: Never    Over the last 12 months how often did your partner scream or curse at you?: Never   Family History  Problem Relation Age of Onset   Hypertension Father    Renal Disease Father    Diabetes Mother    Hypertension Mother    Edema Mother    Diabetes Sister    Diabetes Brother    Past Surgical History:  Procedure Laterality Date   ABDOMINAL HYSTERECTOMY     ANTERIOR CERVICAL DECOMP/DISCECTOMY FUSION N/A 07/21/2017   Procedure: ANTERIOR CERVICAL DISCECTOMY AND FUSION C6-7, REMOVAL OF SCREW C6 PLATE, DEPUY ZERO-P IMPLANT, LOCAL BONE GRAFT, ALLOGRAFT BONE GRAFT, VIVIGEN;  Surgeon: Lucilla Lynwood BRAVO, MD;  Location: [facility] OR;  Service: Orthopedics;  Laterality: N/A;   BACK SURGERY     laminectomy   BONE GRAFT HIP ILIAC CREST     BREAST BIOPSY     CARPAL TUNNEL RELEASE     CERVICAL DISCECTOMY  07/21/2017   CESAREAN SECTION     CHOLECYSTECTOMY     COLONOSCOPY     frontal fusion     neck fusion     patient reported   SHOULDER ARTHROSCOPY WITH SUBACROMIAL DECOMPRESSION, ROTATOR CUFF REPAIR AND BICEP TENDON REPAIR Right 10/31/2017   Procedure: RIGHT SHOULDER ARTHROSCOPY, MANIPULATION UNDER ANESTHESIA, BICEPS TENODESIS, AND MINI OPEN ROTATOR CUFF TEAR REPAIR;  Surgeon: Addie Cordella Hamilton, MD;  Location: [facility];  Service: Orthopedics;  Laterality: Right;      Rolinda Rogue, MD 08/25/23 1506

## 2023-08-25 NOTE — ED Triage Notes (Addendum)
 Pt reports Hx of sciatic pain- Had MRI 3 days ago and is in the process of scheduling surgery. Her PCP couldn't see her today. Taking flexeril, tylenol and diclofenac without relief. She drove herself here

## 2023-08-27 ENCOUNTER — Ambulatory Visit (INDEPENDENT_AMBULATORY_CARE_PROVIDER_SITE_OTHER): Payer: 59 | Admitting: Orthopedic Surgery

## 2023-08-27 DIAGNOSIS — M5416 Radiculopathy, lumbar region: Secondary | ICD-10-CM | POA: Diagnosis not present

## 2023-08-27 DIAGNOSIS — M47816 Spondylosis without myelopathy or radiculopathy, lumbar region: Secondary | ICD-10-CM | POA: Diagnosis not present

## 2023-08-27 MED ORDER — HYDROCODONE-ACETAMINOPHEN 5-325 MG PO TABS: 1.0000 | ORAL_TABLET | Freq: Four times a day (QID) | ORAL | 0 refills | Status: AC | PRN

## 2023-08-27 NOTE — Progress Notes (Signed)
 Orthopedic Spine Surgery Office Note   Assessment: [name] with low back pain that radiates into the right lower extremity. She feels its along the anterior and lateral aspect of the right thigh. 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      Plan: -Patient has tried PT, Tylenol , diclofenac , oral steroids, steroid injections, gabapentin  -Prescribed Norco to help with her pain and referred to pain management -I told her I do not have an explanation for her radiating right leg pain since her disc herniation is on the left. I encouraged her to undergo further work up with her PCP since I do not see any lumbar stenosis to explain her pain -Need to be nicotine  free and have an A1c of 7.5 or less prior to any elective spine surgery -Patient should return to office on an as needed basis     Patient expressed understanding of the plan and all questions were answered to the patient's satisfaction.    ___________________________________________________________________________     History:   [name] who presents today for follow up on her lumbar spine.  Patient is having significant low back pain that radiates into the right lower extremity.  She feels a goes along the lateral and anterior aspects of the thigh.  She said the pain is gotten worse since she was last seen.  She is not having any pain radiating into the left lower extremity today and has not had any recently.  She has not developed any new symptoms except worsening pain since she was last seen.  She was seen recently in the emergency department for this problem.   Treatments tried: PT, Tylenol , diclofenac , oral steroids, steroid injections, gabapentin      Physical Exam:   General: no acute distress, appears stated age Neurologic: alert, answering questions appropriately, following commands Respiratory: unlabored breathing on room air, symmetric chest rise Psychiatric: appropriate  affect, normal cadence to speech     MSK (spine):   -Strength exam                                                   Left                  Right EHL                              5/5                  5/5 TA                                 5/5                  5/5 GSC                             5/5                  5/5 Knee extension            5/5                  5/5 Hip  flexion                    5/5                  4/5   -Sensory exam                           Sensation intact to light touch in L3-S1 nerve distributions of bilateral lower extremities   -Achilles DTR: 2/4 on the left, 2/4 on the right -Patellar tendon DTR: 2/4 on the left, 2/4 on the right   -Straight leg raise: negative bilaterally -Femoral nerve stretch test: negative bilaterally -Clonus: no beats bilaterally   Imaging: XR of the lumbar spine from 08/14/2023 was independently reviewed and interpreted, showing interbody devices at L4/5 and L5/S1. Interbodies appear in appropriate position. No lucency seen around the interbodies. Grade 1 spondylolisthesis at L3/4. No fracture or dislocation seen. PI of 70, LL of 46.    CT of the abdomen and pelvis from 02/24/2023 was independently reviewed and interpreted, showing no lumbar spine fracture or dislocation. There appears to be bridging bone across the former L4/5 and L5/S1 disc spaces where there are interbody devices. No lucency around the interbodies.   MRI of the lumbar spine from 08/22/2023 was independently reviewed and interpreted, showing foraminal disc herniation on the left at L3/4 causing foraminal stenosis.  No other significant stenosis seen.  Spondylolisthesis at L3/4.  There is about 1 mm difference in the spondylolisthesis between the supine MRI and the standing lateral film.     Patient name: [name] Patient MRN: [id number] Date of visit: 08/27/23

## 2023-09-16 ENCOUNTER — Other Ambulatory Visit: Payer: Self-pay

## 2023-09-16 ENCOUNTER — Emergency Department (HOSPITAL_COMMUNITY): Payer: 59

## 2023-09-16 ENCOUNTER — Encounter (HOSPITAL_COMMUNITY): Payer: Self-pay

## 2023-09-16 ENCOUNTER — Emergency Department (HOSPITAL_COMMUNITY)
Admission: EM | Admit: 2023-09-16 | Discharge: 2023-09-16 | Disposition: A | Discharge status: Home / Self Care | Payer: 59 | Attending: Emergency Medicine | Admitting: Emergency Medicine

## 2023-09-16 DIAGNOSIS — Z20822 Contact with and (suspected) exposure to covid-19: Secondary | ICD-10-CM | POA: Insufficient documentation

## 2023-09-16 DIAGNOSIS — R000 Tachycardia, unspecified: Secondary | ICD-10-CM | POA: Insufficient documentation

## 2023-09-16 DIAGNOSIS — R0789 Other chest pain: Secondary | ICD-10-CM | POA: Diagnosis not present

## 2023-09-16 DIAGNOSIS — M25522 Pain in left elbow: Secondary | ICD-10-CM | POA: Insufficient documentation

## 2023-09-16 DIAGNOSIS — M25512 Pain in left shoulder: Secondary | ICD-10-CM | POA: Diagnosis present

## 2023-09-16 DIAGNOSIS — R0600 Dyspnea, unspecified: Secondary | ICD-10-CM | POA: Diagnosis not present

## 2023-09-16 DIAGNOSIS — R079 Chest pain, unspecified: Secondary | ICD-10-CM

## 2023-09-16 DIAGNOSIS — M7582 Other shoulder lesions, left shoulder: Secondary | ICD-10-CM | POA: Diagnosis not present

## 2023-09-16 DIAGNOSIS — M25422 Effusion, left elbow: Secondary | ICD-10-CM | POA: Diagnosis not present

## 2023-09-16 DIAGNOSIS — D72829 Elevated white blood cell count, unspecified: Secondary | ICD-10-CM | POA: Insufficient documentation

## 2023-09-16 DIAGNOSIS — Z7982 Long term (current) use of aspirin: Secondary | ICD-10-CM | POA: Insufficient documentation

## 2023-09-16 LAB — CBC WITH DIFFERENTIAL/PLATELET
Abs Immature Granulocytes: 0.04 10*3/uL (ref 0.00–0.07)
Basophils Absolute: 0 10*3/uL (ref 0.0–0.1)
Basophils Relative: 0 %
Eosinophils Absolute: 0 10*3/uL (ref 0.0–0.5)
Eosinophils Relative: 0 %
HCT: 39.9 % (ref 36.0–46.0)
Hemoglobin: 13.2 g/dL (ref 12.0–15.0)
Immature Granulocytes: 0 %
Lymphocytes Relative: 24 %
Lymphs Abs: 3.1 10*3/uL (ref 0.7–4.0)
MCH: 27.7 pg (ref 26.0–34.0)
MCHC: 33.1 g/dL (ref 30.0–36.0)
MCV: 83.8 fL (ref 80.0–100.0)
Monocytes Absolute: 1.2 10*3/uL — ABNORMAL HIGH (ref 0.1–1.0)
Monocytes Relative: 9 %
Neutro Abs: 8.7 10*3/uL — ABNORMAL HIGH (ref 1.7–7.7)
Neutrophils Relative %: 67 %
Platelets: 348 10*3/uL (ref 150–400)
RBC: 4.76 MIL/uL (ref 3.87–5.11)
RDW: 14.6 % (ref 11.5–15.5)
WBC: 13.1 10*3/uL — ABNORMAL HIGH (ref 4.0–10.5)
nRBC: 0 % (ref 0.0–0.2)

## 2023-09-16 LAB — BASIC METABOLIC PANEL
Anion gap: 13 (ref 5–15)
BUN: 9 mg/dL (ref 6–20)
CO2: 24 mmol/L (ref 22–32)
Calcium: 9.4 mg/dL (ref 8.9–10.3)
Chloride: 98 mmol/L (ref 98–111)
Creatinine, Ser: 0.72 mg/dL (ref 0.44–1.00)
GFR, Estimated: >60 mL/min (ref >60)
Glucose, Bld: 215 mg/dL — ABNORMAL HIGH (ref 70–99)
Potassium: 4.3 mmol/L (ref 3.5–5.1)
Sodium: 135 mmol/L (ref 135–145)

## 2023-09-16 LAB — RESP PANEL BY RT-PCR (RSV, FLU A&B, COVID)  RVPGX2
Influenza A by PCR: NEGATIVE
Influenza B by PCR: NEGATIVE
Resp Syncytial Virus by PCR: NEGATIVE
SARS Coronavirus 2 by RT PCR: NEGATIVE

## 2023-09-16 LAB — TROPONIN I (HIGH SENSITIVITY): Troponin I (High Sensitivity): 10 ng/L (ref <18)

## 2023-09-16 MED ORDER — ETODOLAC 400 MG PO TABS: 400.0000 mg | ORAL_TABLET | Freq: Two times a day (BID) | ORAL | 0 refills | Status: DC

## 2023-09-16 MED ORDER — ALPRAZOLAM 0.25 MG PO TABS
1.0000 mg | ORAL_TABLET | Freq: Once | ORAL | Status: AC
  Administered 2023-09-16: 1 mg via ORAL
  Filled 2023-09-16: qty 4

## 2023-09-16 MED ORDER — METOPROLOL SUCCINATE ER 25 MG PO TB24
50.0000 mg | ORAL_TABLET | Freq: Every day | ORAL | Status: DC
  Administered 2023-09-16: 50 mg via ORAL
  Filled 2023-09-16: qty 2

## 2023-09-16 MED ORDER — HYDROCODONE-ACETAMINOPHEN 5-325 MG PO TABS
1.0000 | ORAL_TABLET | Freq: Once | ORAL | Status: AC
Start: 2023-09-16 — End: 2023-09-16
  Administered 2023-09-16: 1 via ORAL
  Filled 2023-09-16: qty 1

## 2023-09-16 MED ORDER — IOHEXOL 350 MG/ML SOLN: 65.0000 mL | Freq: Once | INTRAVENOUS | Status: DC | PRN

## 2023-09-16 MED ORDER — ACETAMINOPHEN 325 MG PO TABS
650.0000 mg | ORAL_TABLET | Freq: Once | ORAL | Status: AC
  Administered 2023-09-16: 650 mg via ORAL
  Filled 2023-09-16: qty 2

## 2023-09-16 MED ORDER — ONDANSETRON 4 MG PO TBDP
4.0000 mg | ORAL_TABLET | Freq: Once | ORAL | Status: AC
  Administered 2023-09-16: 4 mg via ORAL
  Filled 2023-09-16: qty 1

## 2023-09-16 MED ORDER — ACETAMINOPHEN 500 MG PO TABS: 1000.0000 mg | ORAL_TABLET | Freq: Once | ORAL | Status: DC

## 2023-09-16 MED ORDER — IOHEXOL 350 MG/ML SOLN
65.0000 mL | Freq: Once | INTRAVENOUS | Status: AC | PRN
  Administered 2023-09-16: 65 mL via INTRAVENOUS

## 2023-09-16 MED ORDER — KETOROLAC TROMETHAMINE 15 MG/ML IJ SOLN
15.0000 mg | Freq: Once | INTRAMUSCULAR | Status: AC
  Administered 2023-09-16: 15 mg via INTRAMUSCULAR
  Filled 2023-09-16: qty 1

## 2023-09-16 NOTE — ED Notes (Signed)
Pt given sandwich bag

## 2023-09-16 NOTE — ED Triage Notes (Signed)
Pt came to ED for left shoulder and elbow pain. Pt has swelling to left hand that started 3 days ago. Denies fevers and denies chest pain.

## 2023-09-16 NOTE — ED Notes (Signed)
Spoke with pt sister per her request for ride home, sister will be on her way shortly to pick up patient

## 2023-09-16 NOTE — ED Provider Notes (Signed)
 Patient had presented for left shoulder and elbow pain.  Please see the note from Norleen Ruth, PA-C for further history, physical and care.  Briefly is a 58 year old female who present with left shoulder elbow pain who was neurovascularly intact. Pain worse with movements and given tenderness and movement pain have low suspicion this represents acute ACS, aortic dissection.  She has normal pulses in her wrist. No fever, no history of IV drug use or history of joint infection in the past and have low suspicion for septic arthritis.  CBC is similar to previous with a mild leukocytosis.  Has become tachycardic, at this time suspect secondary to missed xanax  and metoprolol  doses and these ordered.    HR improved to low 100s.  She is now reporting sharp central chest pain, sensation of dyspnea.  Given tachycardia, cp, dyspnea ordered CT PE study, ecg, troponin.   These returned and showed no evidence of PE or other intrathoracic abnormality (and also no abnormality noted in left shoulder).   Troponin negative, doubt ACS. ECG without acute findings.   Recommend PCP follow up, orthopedic follow up and discussed return precautions. Patient discharged in stable condition with understanding of reasons to return.       Dreama Longs, MD 09/17/23 [phone]

## 2023-09-16 NOTE — ED Provider Notes (Signed)
  EMERGENCY DEPARTMENT AT [facility] Provider Note   CSN: [id number] Arrival date & time: 09/16/23  9197     History  Chief Complaint  Patient presents with   Joint Swelling    [name] is a 58 y.o. female.  58 year old female presents today for concern of left shoulder and left elbow pain.  Started 3 days ago and has not improved.  She denies any traumatic injury.  No fever.  The history is provided by the patient. No language interpreter was used.       Home Medications Prior to Admission medications   Medication Sig Start Date End Date Taking? Authorizing Provider  etodolac  (LODINE ) 400 MG tablet Take 1 tablet (400 mg total) by mouth 2 (two) times daily. 09/16/23  Yes Satya Buttram, PA-C  ACCU-CHEK GUIDE TEST test strip 1 each by Other route as needed. 06/16/23 06/15/24  [provider]  Accu-Chek Softclix Lancets lancets by Other route 2 (two) times daily.    [provider]  albuterol  (PROVENTIL ) (2.5 MG/3ML) 0.083% nebulizer solution Take 3 mLs (2.5 mg total) by nebulization every 6 (six) hours as needed for wheezing or shortness of breath. 12/12/19   Babara, Amy V, PA-C  albuterol  (VENTOLIN  HFA) 108 (90 Base) MCG/ACT inhaler Inhale 2 puffs into the lungs every 4 (four) hours as needed for wheezing. Patient taking differently: Inhale 2 puffs into the lungs every 4 (four) hours as needed for wheezing. Sunday. 06/07/22   Billy Asberry FALCON, PA-C  ALPRAZolam  (XANAX ) 1 MG tablet Take 1 mg by mouth daily as needed for anxiety. 10/23/19   [provider]  amitriptyline  (ELAVIL ) 10 MG tablet Take 10 mg by mouth as directed. Patient not taking: Reported on 08/25/2023 03/30/18   [provider]  amLODipine -olmesartan  (AZOR ) 5-40 MG tablet Take 1 tablet by mouth daily. 06/05/21   [provider]  aspirin  EC 81 MG tablet Take 81 mg by mouth daily. Swallow whole.    [provider]  BAQSIMI TWO PACK 3 MG/DOSE POWD  Place into both nostrils. Patient not taking: Reported on 08/25/2023    [provider]  Blood Glucose Monitoring Suppl (ACCU-CHEK GUIDE) w/Device KIT See admin instructions. 06/16/23   [provider]  Continuous Glucose Sensor (FREESTYLE LIBRE 3 PLUS SENSOR) MISC Change every 15 days 06/16/23   [provider]  Continuous Glucose Sensor (FREESTYLE LIBRE 3 SENSOR) MISC USE SENSOR AND REPLACE EVERY 14 DAYS 05/27/23   [provider]  CREON  36000-114000 units CPEP capsule Take 36,000 Units by mouth daily. Patient not taking: Reported on 12/27/2021 10/16/21   [provider]  cyclobenzaprine  (FLEXERIL ) 10 MG tablet Take 1 tablet (10 mg total) by mouth 2 (two) times daily as needed for muscle spasms. Patient taking differently: Take 10 mg by mouth 2 (two) times daily as needed for muscle spasms. Sunday. 06/02/23   Billy Asberry FALCON, PA-C  dicyclomine  (BENTYL ) 10 MG capsule Take 10 mg by mouth daily. 10/16/21   [provider]  escitalopram  (LEXAPRO ) 20 MG tablet Take 20 mg by mouth daily. Patient not taking: Reported on 08/25/2023 08/10/20   [provider]  Ferrous Sulfate (IRON PO) Take by mouth. Patient not taking: Reported on 08/25/2023    [provider]  fluticasone  (FLONASE ) 50 MCG/ACT nasal spray Place 1 spray into both nostrils daily as needed for allergies or rhinitis.    [provider]  gabapentin  (NEURONTIN ) 100 MG capsule Take 1 capsule (100 mg  total) by mouth 3 (three) times daily. Patient not taking: Reported on 08/25/2023 06/09/23 07/09/23  Schuman, James T, PA-C  HUMALOG  MIX 75/25 KWIKPEN (75-25) 100 UNIT/ML KwikPen Inject into the skin as directed.    [provider]  Insulin  Lispro Prot & Lispro (HUMALOG  MIX 75/25 KWIKPEN) (75-25) 100 UNIT/ML Kwikpen Inject 80 units in the morning and 70 units in the evening 08/09/23     Insulin  Pen Needle 32G X 4 MM MISC 1 each by Does not apply route 3 (three) times  daily. 09/08/22   Jillian Buttery, MD  lidocaine  (LIDODERM ) 5 % Place 1 patch onto the skin daily. Remove & Discard patch within 12 hours or as directed by MD 06/09/23   Victor Lynwood DASEN, PA-C  metoCLOPramide  (REGLAN ) 10 MG tablet Take 1 tablet (10 mg total) by mouth every 8 (eight) hours as needed for nausea. Patient not taking: Reported on 08/25/2023 09/08/22   Jillian Buttery, MD  metoprolol  succinate (TOPROL -XL) 50 MG 24 hr tablet Take 50 mg by mouth daily. 07/21/14   [provider]  nicotine  (NICODERM CQ  - DOSED IN MG/24 HOURS) 14 mg/24hr patch Place 1 patch (14 mg total) onto the skin daily. 09/09/22   Jillian Buttery, MD  ondansetron  (ZOFRAN -ODT) 4 MG disintegrating tablet Take 1 tablet (4 mg total) by mouth every 8 (eight) hours as needed for nausea or vomiting. 03/02/23   Theadore Ozell HERO, MD  pantoprazole  (PROTONIX ) 40 MG tablet Take 40 mg by mouth 2 (two) times daily. 01/15/22   [provider]  polyethylene glycol (MIRALAX  / GLYCOLAX ) 17 g packet Take 17 g by mouth daily as needed. 09/08/22   Jillian Buttery, MD  promethazine  (PHENERGAN ) 25 MG tablet Take 1 tablet (25 mg total) by mouth every 6 (six) hours as needed for nausea or vomiting. 05/17/23   Tegeler, Lonni PARAS, MD  simvastatin  (ZOCOR ) 20 MG tablet Take 20 mg by mouth daily. 08/22/20   [provider]  sucralfate  (CARAFATE ) 1 g tablet Take 1 tablet (1 g total) by mouth 4 (four) times daily -  with meals and at bedtime. Patient not taking: Reported on 08/25/2023 02/24/23   Garrick Charleston, MD      Allergies    Patient has no known allergies.    Review of Systems   Review of Systems  Constitutional:  Negative for fever.  Musculoskeletal:  Positive for arthralgias. Negative for joint swelling.  All other systems reviewed and are negative.   Physical Exam Updated Vital Signs BP (!) 147/85 (BP Location: Right Arm)   Pulse 72   Temp 99.1 F (37.3 C) (Oral)   Resp 20   Ht 5' 4 (1.626 m)   Wt 101.2 kg    SpO2 97%   BMI 38.28 kg/m  Physical Exam Vitals and nursing note reviewed.  Constitutional:      General: She is not in acute distress.    Appearance: Normal appearance. She is not ill-appearing.  HENT:     Head: Normocephalic and atraumatic.     Nose: Nose normal.  Eyes:     Conjunctiva/sclera: Conjunctivae normal.  Cardiovascular:     Rate and Rhythm: Normal rate.  Pulmonary:     Effort: Pulmonary effort is normal. No respiratory distress.  Musculoskeletal:        General: No deformity.     Comments: Tenderness palpation over the left shoulder and the left elbow.  Without warmth or erythema.  Neurovascularly intact.  Limited range of motion secondary to  pain.  Skin:    Findings: No rash.  Neurological:     Mental Status: She is alert.     ED Results / Procedures / Treatments   Labs (all labs ordered are listed, but only abnormal results are displayed) Labs Reviewed  CBC WITH DIFFERENTIAL/PLATELET - Abnormal; Notable for the following components:      Result Value   WBC 13.1 (*)    Neutro Abs 8.7 (*)    Monocytes Absolute 1.2 (*)    All other components within normal limits  BASIC METABOLIC PANEL - Abnormal; Notable for the following components:   Glucose, Bld 215 (*)    All other components within normal limits    EKG None  Radiology DG Shoulder Left Result Date: 09/16/2023 CLINICAL DATA:  pain EXAM: LEFT SHOULDER - 2+ VIEW COMPARISON:  09/02/2017 FINDINGS: There is no evidence of fracture or dislocation. Small acromioclavicular spurs. There is no evidence of arthropathy or other focal bone abnormality. Soft tissues are unremarkable. IMPRESSION: 1. No acute findings. 2. Small acromioclavicular spurs. Electronically Signed   By: JONETTA Faes M.D.   On: 09/16/2023 14:14   DG Elbow Complete Left Result Date: 09/16/2023 CLINICAL DATA:  Pain and swelling x3 days EXAM: LEFT ELBOW - COMPLETE 3+ VIEW COMPARISON:  None Available. FINDINGS: There is no evidence of fracture,  dislocation, or joint effusion. There is no evidence of arthropathy or other focal bone abnormality. Soft tissues are unremarkable. IMPRESSION: Negative. Electronically Signed   By: JONETTA Faes M.D.   On: 09/16/2023 14:13    Procedures Procedures    Medications Ordered in ED Medications  ketorolac  (TORADOL ) 15 MG/ML injection 15 mg (has no administration in time range)  HYDROcodone -acetaminophen  (NORCO/VICODIN) 5-325 MG per tablet 1 tablet (1 tablet Oral Given 09/16/23 1235)  ondansetron  (ZOFRAN -ODT) disintegrating tablet 4 mg (4 mg Oral Given 09/16/23 1236)    ED Course/ Medical Decision Making/ A&P                                 Medical Decision Making Amount and/or Complexity of Data Reviewed Labs: ordered. Radiology: ordered.  Risk Prescription drug management.   58 year old female presents today for concern of left shoulder and elbow pain.  Neurovascularly intact.  Afebrile.  Mild leukocytosis but no left shift.  Glucose of 215 otherwise without acute acute concern on metabolic panel.  Shoulder immobilizer and referral to orthopedics given.  Patient agreeable.  Will give Toradol  to the emergency department and prescribe Lodine .  She is agreeable.  Discharged in stable condition.   Final Clinical Impression(s) / ED Diagnoses Final diagnoses:  Acute pain of left shoulder    Rx / DC Orders ED Discharge Orders          Ordered    etodolac  (LODINE ) 400 MG tablet  2 times daily        09/16/23 1520              Hildegard Loge, PA-C 09/16/23 1523    Ruthe Cornet, DO 09/17/23 [phone]

## 2023-09-16 NOTE — ED Notes (Signed)
 Pt resting with eyes closed; respirations spontaneous, even, unlabored

## 2023-09-16 NOTE — ED Notes (Signed)
 Assumed pt care.

## 2023-09-16 NOTE — Discharge Instructions (Addendum)
 X-rays without concern for bony issue.  If you develop fever, severe pain or other concerning findings please return to the emergency room.  Otherwise follow-up with Dr. Thyra office.  I have sent Lodine  into the pharmacy for you.  Do not combine this with ibuprofen  or other anti-inflammatory medications.  In addition of this medication you can take 1000 mg of Tylenol .  You may also take the oxycodone  that was prescribed to you 09/11/2023.

## 2023-09-25 ENCOUNTER — Other Ambulatory Visit: Payer: Self-pay | Admitting: Orthopedic Surgery

## 2023-09-26 ENCOUNTER — Other Ambulatory Visit: Payer: Self-pay | Admitting: Internal Medicine

## 2023-09-26 DIAGNOSIS — Z1231 Encounter for screening mammogram for malignant neoplasm of breast: Secondary | ICD-10-CM

## 2023-10-05 ENCOUNTER — Ambulatory Visit
Admission: EM | Admit: 2023-10-05 | Discharge: 2023-10-05 | Disposition: A | Discharge status: Home / Self Care | Payer: 59 | Attending: Emergency Medicine | Admitting: Emergency Medicine

## 2023-10-05 ENCOUNTER — Emergency Department (HOSPITAL_COMMUNITY): Payer: 59

## 2023-10-05 ENCOUNTER — Observation Stay (HOSPITAL_COMMUNITY)
Admission: EM | Admit: 2023-10-05 | Discharge: 2023-10-09 | Disposition: A | Discharge status: Home / Self Care | Payer: 59 | Attending: Family Medicine | Admitting: Family Medicine

## 2023-10-05 ENCOUNTER — Other Ambulatory Visit: Payer: Self-pay

## 2023-10-05 DIAGNOSIS — Z79899 Other long term (current) drug therapy: Secondary | ICD-10-CM | POA: Insufficient documentation

## 2023-10-05 DIAGNOSIS — R112 Nausea with vomiting, unspecified: Secondary | ICD-10-CM | POA: Diagnosis not present

## 2023-10-05 DIAGNOSIS — K861 Other chronic pancreatitis: Secondary | ICD-10-CM | POA: Insufficient documentation

## 2023-10-05 DIAGNOSIS — E1165 Type 2 diabetes mellitus with hyperglycemia: Secondary | ICD-10-CM | POA: Diagnosis not present

## 2023-10-05 DIAGNOSIS — R1110 Vomiting, unspecified: Principal | ICD-10-CM

## 2023-10-05 DIAGNOSIS — J441 Chronic obstructive pulmonary disease with (acute) exacerbation: Secondary | ICD-10-CM | POA: Diagnosis not present

## 2023-10-05 DIAGNOSIS — G8929 Other chronic pain: Secondary | ICD-10-CM | POA: Diagnosis present

## 2023-10-05 DIAGNOSIS — E1169 Type 2 diabetes mellitus with other specified complication: Secondary | ICD-10-CM | POA: Insufficient documentation

## 2023-10-05 DIAGNOSIS — F1090 Alcohol use, unspecified, uncomplicated: Secondary | ICD-10-CM | POA: Diagnosis not present

## 2023-10-05 DIAGNOSIS — I10 Essential (primary) hypertension: Secondary | ICD-10-CM | POA: Diagnosis present

## 2023-10-05 DIAGNOSIS — Z6836 Body mass index (BMI) 36.0-36.9, adult: Secondary | ICD-10-CM | POA: Diagnosis not present

## 2023-10-05 DIAGNOSIS — Z794 Long term (current) use of insulin: Secondary | ICD-10-CM | POA: Insufficient documentation

## 2023-10-05 DIAGNOSIS — F17200 Nicotine dependence, unspecified, uncomplicated: Secondary | ICD-10-CM | POA: Diagnosis present

## 2023-10-05 DIAGNOSIS — M545 Low back pain, unspecified: Secondary | ICD-10-CM | POA: Insufficient documentation

## 2023-10-05 DIAGNOSIS — E66812 Obesity, class 2: Secondary | ICD-10-CM | POA: Insufficient documentation

## 2023-10-05 DIAGNOSIS — D72829 Elevated white blood cell count, unspecified: Secondary | ICD-10-CM | POA: Diagnosis not present

## 2023-10-05 DIAGNOSIS — E669 Obesity, unspecified: Secondary | ICD-10-CM | POA: Diagnosis present

## 2023-10-05 DIAGNOSIS — Z1152 Encounter for screening for COVID-19: Secondary | ICD-10-CM | POA: Diagnosis not present

## 2023-10-05 DIAGNOSIS — E6609 Other obesity due to excess calories: Secondary | ICD-10-CM

## 2023-10-05 DIAGNOSIS — Z7901 Long term (current) use of anticoagulants: Secondary | ICD-10-CM | POA: Insufficient documentation

## 2023-10-05 DIAGNOSIS — F17210 Nicotine dependence, cigarettes, uncomplicated: Secondary | ICD-10-CM | POA: Diagnosis not present

## 2023-10-05 DIAGNOSIS — R10819 Abdominal tenderness, unspecified site: Secondary | ICD-10-CM | POA: Diagnosis present

## 2023-10-05 LAB — URINALYSIS, ROUTINE W REFLEX MICROSCOPIC
Bilirubin Urine: NEGATIVE
Glucose, UA: >=500 mg/dL — AB
Hgb urine dipstick: NEGATIVE
Ketones, ur: 80 mg/dL — AB
Leukocytes,Ua: NEGATIVE
Nitrite: NEGATIVE
Protein, ur: NEGATIVE mg/dL
Specific Gravity, Urine: 1.028 (ref 1.005–1.030)
pH: 6 (ref 5.0–8.0)

## 2023-10-05 LAB — CBC WITH DIFFERENTIAL/PLATELET
Abs Immature Granulocytes: 0.05 10*3/uL (ref 0.00–0.07)
Basophils Absolute: 0 10*3/uL (ref 0.0–0.1)
Basophils Relative: 0 %
Eosinophils Absolute: 0 10*3/uL (ref 0.0–0.5)
Eosinophils Relative: 0 %
HCT: 47.1 % — ABNORMAL HIGH (ref 36.0–46.0)
Hemoglobin: 15.4 g/dL — ABNORMAL HIGH (ref 12.0–15.0)
Immature Granulocytes: 0 %
Lymphocytes Relative: 22 %
Lymphs Abs: 2.9 10*3/uL (ref 0.7–4.0)
MCH: 27.5 pg (ref 26.0–34.0)
MCHC: 32.7 g/dL (ref 30.0–36.0)
MCV: 84.3 fL (ref 80.0–100.0)
Monocytes Absolute: 0.6 10*3/uL (ref 0.1–1.0)
Monocytes Relative: 4 %
Neutro Abs: 9.7 10*3/uL — ABNORMAL HIGH (ref 1.7–7.7)
Neutrophils Relative %: 74 %
Platelets: 433 10*3/uL — ABNORMAL HIGH (ref 150–400)
RBC: 5.59 MIL/uL — ABNORMAL HIGH (ref 3.87–5.11)
RDW: 13.8 % (ref 11.5–15.5)
WBC: 13.2 10*3/uL — ABNORMAL HIGH (ref 4.0–10.5)
nRBC: 0 % (ref 0.0–0.2)

## 2023-10-05 LAB — COMPREHENSIVE METABOLIC PANEL
ALT: 26 U/L (ref 0–44)
AST: 27 U/L (ref 15–41)
Albumin: 4.4 g/dL (ref 3.5–5.0)
Alkaline Phosphatase: 124 U/L (ref 38–126)
Anion gap: 14 (ref 5–15)
BUN: 15 mg/dL (ref 6–20)
CO2: 22 mmol/L (ref 22–32)
Calcium: 9.6 mg/dL (ref 8.9–10.3)
Chloride: 103 mmol/L (ref 98–111)
Creatinine, Ser: 0.72 mg/dL (ref 0.44–1.00)
GFR, Estimated: >60 mL/min (ref >60)
Glucose, Bld: 247 mg/dL — ABNORMAL HIGH (ref 70–99)
Potassium: 3.9 mmol/L (ref 3.5–5.1)
Sodium: 139 mmol/L (ref 135–145)
Total Bilirubin: 0.9 mg/dL (ref 0.0–1.2)
Total Protein: 8.3 g/dL — ABNORMAL HIGH (ref 6.5–8.1)

## 2023-10-05 LAB — GLUCOSE, CAPILLARY: Glucose-Capillary: 324 mg/dL — ABNORMAL HIGH (ref 70–99)

## 2023-10-05 LAB — LIPASE, BLOOD: Lipase: 29 U/L (ref 11–51)

## 2023-10-05 LAB — POCT FASTING CBG KUC MANUAL ENTRY: POCT Glucose (KUC): 203 mg/dL — AB (ref 70–99)

## 2023-10-05 MED ORDER — ONDANSETRON 4 MG PO TBDP
4.0000 mg | ORAL_TABLET | Freq: Once | ORAL | Status: AC
Start: 2023-10-05 — End: 2023-10-05
  Administered 2023-10-05: 4 mg via ORAL

## 2023-10-05 MED ORDER — HYDROMORPHONE HCL 1 MG/ML IJ SOLN
1.0000 mg | Freq: Once | INTRAMUSCULAR | Status: AC
  Administered 2023-10-05: 1 mg via INTRAVENOUS
  Filled 2023-10-05: qty 1

## 2023-10-05 MED ORDER — HYDROMORPHONE HCL 1 MG/ML IJ SOLN
1.0000 mg | INTRAMUSCULAR | Status: DC | PRN
  Administered 2023-10-05 – 2023-10-07 (×7): 1 mg via INTRAVENOUS
  Filled 2023-10-05 (×8): qty 1

## 2023-10-05 MED ORDER — INSULIN ASPART 100 UNIT/ML IJ SOLN
0.0000 [IU] | Freq: Three times a day (TID) | INTRAMUSCULAR | Status: DC
  Administered 2023-10-06: 2 [IU] via SUBCUTANEOUS
  Administered 2023-10-06: 5 [IU] via SUBCUTANEOUS
  Administered 2023-10-06: 3 [IU] via SUBCUTANEOUS
  Administered 2023-10-07: 5 [IU] via SUBCUTANEOUS
  Administered 2023-10-07: 3 [IU] via SUBCUTANEOUS
  Administered 2023-10-08: 2 [IU] via SUBCUTANEOUS
  Administered 2023-10-08 (×2): 9 [IU] via SUBCUTANEOUS
  Administered 2023-10-09: 5 [IU] via SUBCUTANEOUS

## 2023-10-05 MED ORDER — LORAZEPAM 2 MG/ML IJ SOLN
0.5000 mg | Freq: Once | INTRAMUSCULAR | Status: AC
  Administered 2023-10-05: 0.5 mg via INTRAVENOUS
  Filled 2023-10-05: qty 1

## 2023-10-05 MED ORDER — METOPROLOL TARTRATE 5 MG/5ML IV SOLN
5.0000 mg | Freq: Four times a day (QID) | INTRAVENOUS | Status: DC | PRN
  Administered 2023-10-05: 5 mg via INTRAVENOUS
  Filled 2023-10-05: qty 5

## 2023-10-05 MED ORDER — SODIUM CHLORIDE 0.9 % IV BOLUS
1000.0000 mL | Freq: Once | INTRAVENOUS | Status: AC
  Administered 2023-10-05: 1000 mL via INTRAVENOUS

## 2023-10-05 MED ORDER — INSULIN ASPART 100 UNIT/ML IJ SOLN
0.0000 [IU] | Freq: Every day | INTRAMUSCULAR | Status: DC
  Administered 2023-10-05: 4 [IU] via SUBCUTANEOUS
  Administered 2023-10-06: 3 [IU] via SUBCUTANEOUS
  Administered 2023-10-07 – 2023-10-08 (×2): 4 [IU] via SUBCUTANEOUS

## 2023-10-05 MED ORDER — LACTATED RINGERS IV SOLN: INTRAVENOUS | Status: AC

## 2023-10-05 MED ORDER — ONDANSETRON 4 MG PO TBDP
4.0000 mg | ORAL_TABLET | Freq: Once | ORAL | Status: AC
  Administered 2023-10-05: 4 mg via ORAL

## 2023-10-05 MED ORDER — IOHEXOL 300 MG/ML  SOLN
100.0000 mL | Freq: Once | INTRAMUSCULAR | Status: AC | PRN
  Administered 2023-10-05: 100 mL via INTRAVENOUS

## 2023-10-05 MED ORDER — METOCLOPRAMIDE HCL 5 MG/ML IJ SOLN
10.0000 mg | Freq: Once | INTRAMUSCULAR | Status: AC
Start: 2023-10-05 — End: 2023-10-05
  Administered 2023-10-05: 10 mg via INTRAMUSCULAR

## 2023-10-05 MED ORDER — ENOXAPARIN SODIUM 60 MG/0.6ML IJ SOSY
50.0000 mg | PREFILLED_SYRINGE | INTRAMUSCULAR | Status: DC
  Administered 2023-10-05: 50 mg via SUBCUTANEOUS
  Filled 2023-10-05: qty 0.6

## 2023-10-05 NOTE — ED Triage Notes (Signed)
 Pt reports chills, sweats, congestion. Vomiting since 0200 "I can't count how many times but twice since I got here". States she has had a cold for a week. Also c/o upper abd pain. CBG 203

## 2023-10-05 NOTE — Discharge Instructions (Signed)
 Go to the Emergency department for further treatment

## 2023-10-05 NOTE — ED Triage Notes (Signed)
 Patient to ED by POV with c/o vomiting and ABD pain. Patient seen today at [facility] and was given Patient given ODT Zofran and Reglan IM and advised to come to ED for mores testing. Reports N/V since 0300 c/o 101 ABD pain.

## 2023-10-05 NOTE — ED Provider Notes (Signed)
 [place] [facility]ERGENCY DEPARTMENT AT [facility] Provider Note   CSN: [id number] Arrival date & time: 10/05/23  1254     History  Chief Complaint  Patient presents with   Abdominal Pain   Emesis    [name] is a 58 y.o. female.  Patient here with nausea vomiting abdominal pain.  History of diabetes marijuana use pancreatitis.  Pain since this morning.  She went to urgent care given Reglan Zofran sent for more evaluation.  She denies any fever chills chest pain diarrhea.  States that she been trying to cut back on smoking marijuana.  Has been told she has hyperemesis from this.  Never been told she has had gastroparesis.  Denies any weakness numbness tingling.  She has had her gallbladder removed in the past.  The history is provided by the patient.       Home Medications Prior to Admission medications   Medication Sig Start Date End Date Taking? Authorizing Provider  ACCU-CHEK GUIDE TEST test strip 1 each by Other route as needed. 06/16/23 06/15/24  [provider]  Accu-Chek Softclix Lancets lancets by Other route 2 (two) times daily.    [provider]  albuterol (PROVENTIL) (2.5 MG/3ML) 0.083% nebulizer solution Take 3 mLs (2.5 mg total) by nebulization every 6 (six) hours as needed for wheezing or shortness of breath. 12/12/19   Cathie Hoops, Amy V, PA-C  albuterol (VENTOLIN HFA) 108 (90 Base) MCG/ACT inhaler Inhale 2 puffs into the lungs every 4 (four) hours as needed for wheezing. Patient taking differently: Inhale 2 puffs into the lungs every 4 (four) hours as needed for wheezing. Sunday. 06/07/22   Tomi Bamberger, PA-C  ALPRAZolam Prudy Feeler) 1 MG tablet Take 1 mg by mouth daily as needed for anxiety. 10/23/19   [provider]  amitriptyline (ELAVIL) 10 MG tablet Take 10 mg by mouth as directed. Patient not taking: Reported on 08/25/2023 03/30/18   [provider]  amLODipine-olmesartan (AZOR) 5-40 MG tablet Take 1 tablet by mouth  daily. 06/05/21   [provider]  aspirin EC 81 MG tablet Take 81 mg by mouth daily. Swallow whole.    [provider]  BAQSIMI TWO PACK 3 MG/DOSE POWD Place into both nostrils. Patient not taking: Reported on 08/25/2023    [provider]  Blood Glucose Monitoring Suppl (ACCU-CHEK GUIDE) w/Device KIT See admin instructions. 06/16/23   [provider]  Continuous Glucose Sensor (FREESTYLE LIBRE 3 PLUS SENSOR) MISC Change every 15 days 06/16/23   [provider]  Continuous Glucose Sensor (FREESTYLE LIBRE 3 SENSOR) MISC USE SENSOR AND REPLACE EVERY 14 DAYS 05/27/23   [provider]  CREON 36000-114000 units CPEP capsule Take 36,000 Units by mouth daily. Patient not taking: Reported on 12/27/2021 10/16/21   [provider]  cyclobenzaprine (FLEXERIL) 10 MG tablet Take 1 tablet (10 mg total) by mouth 2 (two) times daily as needed for muscle spasms. Patient taking differently: Take 10 mg by mouth 2 (two) times daily as needed for muscle spasms. Sunday. 06/02/23   Tomi Bamberger, PA-C  diclofenac (VOLTAREN) 50 MG EC tablet TAKE 1 TABLET(50 MG) BY MOUTH TWICE DAILY Patient not taking: Reported on 10/05/2023 09/25/23   London Sheer, MD  dicyclomine (BENTYL) 10 MG capsule Take 10 mg by mouth daily. Patient not taking: Reported on 10/05/2023 10/16/21   [provider]  escitalopram (LEXAPRO) 20 MG tablet Take 20 mg by mouth daily. Patient not taking: Reported on 08/25/2023  08/10/20   [provider]  etodolac (LODINE) 400 MG tablet Take 1 tablet (400 mg total) by mouth 2 (two) times daily. 09/16/23   Marita Kansas, PA-C  Ferrous Sulfate (IRON PO) Take by mouth. Patient not taking: Reported on 08/25/2023    [provider]  fluticasone (FLONASE) 50 MCG/ACT nasal spray Place 1 spray into both nostrils daily as needed for allergies or rhinitis.    [provider]  gabapentin (NEURONTIN) 100 MG capsule Take 1 capsule  (100 mg total) by mouth 3 (three) times daily. Patient not taking: Reported on 08/25/2023 06/09/23 07/09/23  Netta Corrigan, PA-C  HUMALOG MIX 75/25 KWIKPEN (75-25) 100 UNIT/ML KwikPen Inject into the skin as directed.    [provider]  Insulin Lispro Prot & Lispro (HUMALOG MIX 75/25 KWIKPEN) (75-25) 100 UNIT/ML Kwikpen Inject 80 units in the morning and 70 units in the evening 08/09/23     Insulin Pen Needle 32G X 4 MM MISC 1 each by Does not apply route 3 (three) times daily. 09/08/22   Burnadette Pop, MD  lidocaine (LIDODERM) 5 % Place 1 patch onto the skin daily. Remove & Discard patch within 12 hours or as directed by MD 06/09/23   Netta Corrigan, PA-C  metoCLOPramide (REGLAN) 10 MG tablet Take 1 tablet (10 mg total) by mouth every 8 (eight) hours as needed for nausea. Patient not taking: Reported on 08/25/2023 09/08/22   Burnadette Pop, MD  metoprolol succinate (TOPROL-XL) 50 MG 24 hr tablet Take 50 mg by mouth daily. 07/21/14   [provider]  nicotine (NICODERM CQ - DOSED IN MG/24 HOURS) 14 mg/24hr patch Place 1 patch (14 mg total) onto the skin daily. Patient not taking: Reported on 10/05/2023 09/09/22   Burnadette Pop, MD  ondansetron (ZOFRAN-ODT) 4 MG disintegrating tablet Take 1 tablet (4 mg total) by mouth every 8 (eight) hours as needed for nausea or vomiting. 03/02/23   Sabas Sous, MD  pantoprazole (PROTONIX) 40 MG tablet Take 40 mg by mouth 2 (two) times daily. 01/15/22   [provider]  polyethylene glycol (MIRALAX / GLYCOLAX) 17 g packet Take 17 g by mouth daily as needed. Patient not taking: Reported on 10/05/2023 09/08/22   Burnadette Pop, MD  promethazine (PHENERGAN) 25 MG tablet Take 1 tablet (25 mg total) by mouth every 6 (six) hours as needed for nausea or vomiting. Patient not taking: Reported on 10/05/2023 05/17/23   Tegeler, Canary Brim, MD  simvastatin (ZOCOR) 20 MG tablet Take 20 mg by mouth daily. 08/22/20   [provider]   sucralfate (CARAFATE) 1 g tablet Take 1 tablet (1 g total) by mouth 4 (four) times daily -  with meals and at bedtime. Patient not taking: Reported on 08/25/2023 02/24/23   Gerhard Munch, MD      Allergies    Patient has no known allergies.    Review of Systems   Review of Systems  Physical Exam Updated Vital Signs BP (!) 151/92 (BP Location: Right Arm)   Pulse (!) 115   Temp 98 F (36.7 C) (Oral)   Resp (!) 25   Ht 5\' 5"  (1.651 m)   Wt 104.3 kg   SpO2 91%   BMI 38.27 kg/m  Physical Exam Vitals and nursing note reviewed.  Constitutional:      General: She is in acute distress.     Appearance: She is well-developed.  HENT:     Head: Normocephalic and atraumatic.  Eyes:  Extraocular Movements: Extraocular movements intact.     Conjunctiva/sclera: Conjunctivae normal.  Cardiovascular:     Rate and Rhythm: Normal rate and regular rhythm.     Heart sounds: Normal heart sounds. No murmur heard. Pulmonary:     Effort: Pulmonary effort is normal. No respiratory distress.     Breath sounds: Normal breath sounds.  Abdominal:     Palpations: Abdomen is soft.     Tenderness: There is abdominal tenderness in the epigastric area.  Musculoskeletal:        General: No swelling.     Cervical back: Neck supple.  Skin:    General: Skin is warm and dry.     Capillary Refill: Capillary refill takes less than 2 seconds.  Neurological:     Mental Status: She is alert.  Psychiatric:        Mood and Affect: Mood normal.     ED Results / Procedures / Treatments   Labs (all labs ordered are listed, but only abnormal results are displayed) Labs Reviewed  COMPREHENSIVE METABOLIC PANEL - Abnormal; Notable for the following components:      Result Value   Glucose, Bld 247 (*)    Total Protein 8.3 (*)    All other components within normal limits  CBC WITH DIFFERENTIAL/PLATELET - Abnormal; Notable for the following components:   WBC 13.2 (*)    RBC 5.59 (*)    Hemoglobin 15.4  (*)    HCT 47.1 (*)    Platelets 433 (*)    Neutro Abs 9.7 (*)    All other components within normal limits  URINALYSIS, ROUTINE W REFLEX MICROSCOPIC - Abnormal; Notable for the following components:   Color, Urine STRAW (*)    APPearance HAZY (*)    Glucose, UA >=500 (*)    Ketones, ur 80 (*)    Bacteria, UA RARE (*)    All other components within normal limits  LIPASE, BLOOD    EKG EKG Interpretation Date/Time:  Sunday October 05 2023 16:08:43 EST Ventricular Rate:  111 PR Interval:  164 QRS Duration:  88 QT Interval:  375 QTC Calculation: 510 R Axis:   39  Text Interpretation: Sinus tachycardia Multiple ventricular premature complexes Confirmed by Virgina Norfolk [phone]) on 10/05/2023 4:19:36 PM  Radiology CT ABDOMEN PELVIS W CONTRAST Result Date: 10/05/2023 CLINICAL DATA:  Abdominal pain.  Acute nonlocalized abdominal pain. EXAM: CT ABDOMEN AND PELVIS WITH CONTRAST TECHNIQUE: Multidetector CT imaging of the abdomen and pelvis was performed using the standard protocol following bolus administration of intravenous contrast. RADIATION DOSE REDUCTION: This exam was performed according to the departmental dose-optimization program which includes automated exposure control, adjustment of the mA and/or kV according to patient size and/or use of iterative reconstruction technique. CONTRAST:  OMNIPAQUE IOHEXOL 300 MG/ML  SOLN COMPARISON:  CT abdomen 05/15/2023 FINDINGS: Lower chest: Lung bases are clear. Hepatobiliary: Subtle hypodensity in the subcapsular RIGHT hepatic lobe is not changed from prior (image 11/series 2). No biliary duct dilatation. Postcholecystectomy. Pancreas: Pancreas is normal. No ductal dilatation. No pancreatic inflammation. Spleen: Normal spleen Adrenals/urinary tract: Nodule enlargement of the medial and lateral limb of the LEFT adrenal gland not changed from comparison exam. More remote scan from 11/21/2019 also demonstrates nodule enlargement unchanged from  prior. Favor benign nodularity. RIGHT adrenal gland normal. Kidneys, ureters and bladder normal. Stomach/Bowel: Stomach, small bowel, appendix, and cecum are normal. The colon and rectosigmoid colon are normal. Vascular/Lymphatic: Abdominal aorta is normal caliber. No periportal or retroperitoneal adenopathy. No  pelvic adenopathy. Reproductive: Post hysterectomy.  Adnexa unremarkable Other: No free fluid. Musculoskeletal: Lower lumbar fusion. IMPRESSION: 1. No acute findings in the abdomen pelvis. 2. Normal appendix. 3. Postcholecystectomy. 4. Stable nodular enlargement of the LEFT adrenal gland. Favor benign nodularity. Electronically Signed   By: Genevive Bi M.D.   On: 10/05/2023 16:59    Procedures Procedures    Medications Ordered in ED Medications  LORazepam (ATIVAN) injection 0.5 mg (has no administration in time range)  ondansetron (ZOFRAN-ODT) disintegrating tablet 4 mg (4 mg Oral Given 10/05/23 1332)  sodium chloride 0.9 % bolus 1,000 mL (1,000 mLs Intravenous New Bag/Given 10/05/23 1552)  HYDROmorphone (DILAUDID) injection 1 mg (1 mg Intravenous Given 10/05/23 1647)  iohexol (OMNIPAQUE) 300 MG/ML solution 100 mL (100 mLs Intravenous Contrast Given 10/05/23 1633)    ED Course/ Medical Decision Making/ A&P                                 Medical Decision Making Amount and/or Complexity of Data Reviewed Labs: ordered. Radiology: ordered.  Risk Prescription drug management. Decision regarding hospitalization.   [name] is here with abdominal pain.  Unremarkable vitals.  No fever.  Pain mostly in the epigastric region.  History of marijuana use diabetes gastritis.  Pancreatitis history.  Differential diagnosis could be pancreatitis versus hyperemesis syndrome versus gastritis versus less likely bowel obstruction colitis.  Have no concern for ACS or PE.  Will get EKG labs including CBC CMP lipase CT scan abdomen and pelvis.  Will give IV fluids and antiemetics and  reevaluate.  Patient with markable imaging.  Still tachycardic still very symptomatic.  Cannot tolerate p.o.  I do not think she is in a do well going home.  My suspicion is that this is likely hyperemesis from marijuana or diabetes.  He is not having any chest pain shortness of breath I do not think there is another acute process going on.  Will call hospital team to admit for further symptomatic care.  This chart was dictated using voice recognition software.  Despite best efforts to proofread,  errors can occur which can change the documentation meaning.         Final Clinical Impression(s) / ED Diagnoses Final diagnoses:  Hyperemesis    Rx / DC Orders ED Discharge Orders     None         Virgina Norfolk, DO 10/05/23 1748

## 2023-10-05 NOTE — H&P (Signed)
 History and Physical    [name] WUJ:[id number] DOB: 08-02-1966 DOA: 10/05/2023 DOS: the patient was seen and examined on 10/05/2023 PCP: Fleet Contras, MD  Patient coming from: Home  Chief Complaint:  Chief Complaint  Patient presents with   Abdominal Pain   Emesis   HPI: [name] is a 58 y.o. female with medical history significant of anxiety disorder type 2 diabetes, GERD, essential hypertension, chronic pancreatitis depression, fibromyalgia, rheumatoid arthritis, chronic back pain who presented to the ER with nausea vomiting and chills also congestion.  Patient has been vomiting nonstop.  Also abdominal pain will evaluate that is rated as 10 out of 10.  She was treated by her PCP for "she is a diabetic and blood sugar has been elevated.  No fever no chills.  Patient is being admitted for observation and intractable nausea with vomiting.  Review of Systems: As mentioned in the history of present illness. All other systems reviewed and are negative. Past Medical History:  Diagnosis Date   Anxiety    Arthritis    rheumatoid , osteoarthritis   Bronchitis    Chronic back pain    Depression    Diabetes mellitus    Fatty liver    Fibromyalgia    GERD (gastroesophageal reflux disease)    Headache    Hypertension    Pancreatitis    Pneumonia    Rotator cuff tear, right    Past Surgical History:  Procedure Laterality Date   ABDOMINAL HYSTERECTOMY     ANTERIOR CERVICAL DECOMP/DISCECTOMY FUSION N/A 07/21/2017   Procedure: ANTERIOR CERVICAL DISCECTOMY AND FUSION C6-7, REMOVAL OF SCREW C6 PLATE, DEPUY ZERO-P IMPLANT, LOCAL BONE GRAFT, ALLOGRAFT BONE GRAFT, VIVIGEN;  Surgeon: Kerrin Champagne, MD;  Location: [facility] OR;  Service: Orthopedics;  Laterality: N/A;   BACK SURGERY     laminectomy   BONE GRAFT HIP ILIAC CREST     BREAST BIOPSY     CARPAL TUNNEL RELEASE     CERVICAL DISCECTOMY  07/21/2017   CESAREAN SECTION     CHOLECYSTECTOMY     COLONOSCOPY      frontal fusion     neck fusion     patient reported   SHOULDER ARTHROSCOPY WITH SUBACROMIAL DECOMPRESSION, ROTATOR CUFF REPAIR AND BICEP TENDON REPAIR Right 10/31/2017   Procedure: RIGHT SHOULDER ARTHROSCOPY, MANIPULATION UNDER ANESTHESIA, BICEPS TENODESIS, AND MINI OPEN ROTATOR CUFF TEAR REPAIR;  Surgeon: Cammy Copa, MD;  Location: [facility] OR;  Service: Orthopedics;  Laterality: Right;   Social History:  reports that she has been smoking cigarettes. She has a 20 pack-year smoking history. She has never used smokeless tobacco. She reports current drug use. Drug: Marijuana. She reports that she does not drink alcohol.  No Known Allergies  Family History  Problem Relation Age of Onset   Hypertension Father    Renal Disease Father    Diabetes Mother    Hypertension Mother    Edema Mother    Diabetes Sister    Diabetes Brother     Prior to Admission medications   Medication Sig Start Date End Date Taking? Authorizing Provider  ACCU-CHEK GUIDE TEST test strip 1 each by Other route as needed. 06/16/23 06/15/24  [provider]  Accu-Chek Softclix Lancets lancets by Other route 2 (two) times daily.    [provider]  albuterol (PROVENTIL) (2.5 MG/3ML) 0.083% nebulizer solution Take 3 mLs (2.5 mg total) by nebulization every 6 (six) hours as needed for wheezing or shortness of  breath. 12/12/19   Cathie Hoops, Amy V, PA-C  albuterol (VENTOLIN HFA) 108 (90 Base) MCG/ACT inhaler Inhale 2 puffs into the lungs every 4 (four) hours as needed for wheezing. Patient taking differently: Inhale 2 puffs into the lungs every 4 (four) hours as needed for wheezing. Sunday. 06/07/22   Tomi Bamberger, PA-C  ALPRAZolam Prudy Feeler) 1 MG tablet Take 1 mg by mouth daily as needed for anxiety. 10/23/19   [provider]  amitriptyline (ELAVIL) 10 MG tablet Take 10 mg by mouth as directed. Patient not taking: Reported on 08/25/2023 03/30/18   [provider]  amLODipine-olmesartan (AZOR) 5-40  MG tablet Take 1 tablet by mouth daily. 06/05/21   [provider]  aspirin EC 81 MG tablet Take 81 mg by mouth daily. Swallow whole.    [provider]  BAQSIMI TWO PACK 3 MG/DOSE POWD Place into both nostrils. Patient not taking: Reported on 08/25/2023    [provider]  Blood Glucose Monitoring Suppl (ACCU-CHEK GUIDE) w/Device KIT See admin instructions. 06/16/23   [provider]  Continuous Glucose Sensor (FREESTYLE LIBRE 3 PLUS SENSOR) MISC Change every 15 days 06/16/23   [provider]  Continuous Glucose Sensor (FREESTYLE LIBRE 3 SENSOR) MISC USE SENSOR AND REPLACE EVERY 14 DAYS 05/27/23   [provider]  CREON 36000-114000 units CPEP capsule Take 36,000 Units by mouth daily. Patient not taking: Reported on 12/27/2021 10/16/21   [provider]  cyclobenzaprine (FLEXERIL) 10 MG tablet Take 1 tablet (10 mg total) by mouth 2 (two) times daily as needed for muscle spasms. Patient taking differently: Take 10 mg by mouth 2 (two) times daily as needed for muscle spasms. Sunday. 06/02/23   Tomi Bamberger, PA-C  diclofenac (VOLTAREN) 50 MG EC tablet TAKE 1 TABLET(50 MG) BY MOUTH TWICE DAILY Patient not taking: Reported on 10/05/2023 09/25/23   London Sheer, MD  dicyclomine (BENTYL) 10 MG capsule Take 10 mg by mouth daily. Patient not taking: Reported on 10/05/2023 10/16/21   [provider]  escitalopram (LEXAPRO) 20 MG tablet Take 20 mg by mouth daily. Patient not taking: Reported on 08/25/2023 08/10/20   [provider]  etodolac (LODINE) 400 MG tablet Take 1 tablet (400 mg total) by mouth 2 (two) times daily. 09/16/23   Marita Kansas, PA-C  Ferrous Sulfate (IRON PO) Take by mouth. Patient not taking: Reported on 08/25/2023    [provider]  fluticasone (FLONASE) 50 MCG/ACT nasal spray Place 1 spray into both nostrils daily as needed for allergies or rhinitis.    [provider]  gabapentin (NEURONTIN)  100 MG capsule Take 1 capsule (100 mg total) by mouth 3 (three) times daily. Patient not taking: Reported on 08/25/2023 06/09/23 07/09/23  Netta Corrigan, PA-C  HUMALOG MIX 75/25 KWIKPEN (75-25) 100 UNIT/ML KwikPen Inject into the skin as directed.    [provider]  Insulin Lispro Prot & Lispro (HUMALOG MIX 75/25 KWIKPEN) (75-25) 100 UNIT/ML Kwikpen Inject 80 units in the morning and 70 units in the evening 08/09/23     Insulin Pen Needle 32G X 4 MM MISC 1 each by Does not apply route 3 (three) times daily. 09/08/22   Burnadette Pop, MD  lidocaine (LIDODERM) 5 % Place 1 patch onto the skin daily. Remove & Discard patch within 12 hours or as directed by MD 06/09/23   Netta Corrigan, PA-C  metoCLOPramide (REGLAN) 10 MG tablet Take 1 tablet (10 mg total) by mouth every 8 (eight)  hours as needed for nausea. Patient not taking: Reported on 08/25/2023 09/08/22   Burnadette Pop, MD  metoprolol succinate (TOPROL-XL) 50 MG 24 hr tablet Take 50 mg by mouth daily. 07/21/14   [provider]  nicotine (NICODERM CQ - DOSED IN MG/24 HOURS) 14 mg/24hr patch Place 1 patch (14 mg total) onto the skin daily. Patient not taking: Reported on 10/05/2023 09/09/22   Burnadette Pop, MD  ondansetron (ZOFRAN-ODT) 4 MG disintegrating tablet Take 1 tablet (4 mg total) by mouth every 8 (eight) hours as needed for nausea or vomiting. 03/02/23   Sabas Sous, MD  pantoprazole (PROTONIX) 40 MG tablet Take 40 mg by mouth 2 (two) times daily. 01/15/22   [provider]  polyethylene glycol (MIRALAX / GLYCOLAX) 17 g packet Take 17 g by mouth daily as needed. Patient not taking: Reported on 10/05/2023 09/08/22   Burnadette Pop, MD  promethazine (PHENERGAN) 25 MG tablet Take 1 tablet (25 mg total) by mouth every 6 (six) hours as needed for nausea or vomiting. Patient not taking: Reported on 10/05/2023 05/17/23   Tegeler, Canary Brim, MD  simvastatin (ZOCOR) 20 MG tablet Take 20 mg by mouth daily. 08/22/20    [provider]  sucralfate (CARAFATE) 1 g tablet Take 1 tablet (1 g total) by mouth 4 (four) times daily -  with meals and at bedtime. Patient not taking: Reported on 08/25/2023 02/24/23   Gerhard Munch, MD    Physical Exam: Vitals:   10/05/23 1303 10/05/23 1312 10/05/23 1706  BP: (!) 169/95  (!) 151/92  Pulse: (!) 104  (!) 115  Resp: 18  (!) 25  Temp: 97.8 F (36.6 C)  98 F (36.7 C)  TempSrc: Oral  Oral  SpO2: 100%  91%  Weight: 104.3 kg 104.3 kg   Height: 5\' 4"  (1.626 m) 5\' 5"  (1.651 m)    Constitutional: Acutely ill looking, obese, NAD, calm, comfortable Eyes: PERRL, lids and conjunctivae normal ENMT: Mucous membranes are moist. Posterior pharynx clear of any exudate or lesions.Normal dentition.  Neck: normal, supple, no masses, no thyromegaly Respiratory: clear to auscultation bilaterally, no wheezing, no crackles. Normal respiratory effort. No accessory muscle use.  Cardiovascular: Sinus tachycardia, no murmurs / rubs / gallops. No extremity edema. 2+ pedal pulses. No carotid bruits.  Abdomen: Diffuse mid abdominal tenderness, no masses palpated. No hepatosplenomegaly. Bowel sounds positive.  Musculoskeletal: Good range of motion, no joint swelling or tenderness, Skin: no rashes, lesions, ulcers. No induration Neurologic: CN 2-12 grossly intact. Sensation intact, DTR normal. Strength 5/5 in all 4.  Psychiatric: Normal judgment and insight. Alert and oriented x 3. Normal mood  Data Reviewed:  Temperature 98.2, blood pressure 116/101, pulse 125, white count 13.2 hemoglobin 15.4 and platelets 433 glucose 324 urinalysis essentially negative.  CT abdomen pelvis showed no acute findings normal appendix postcholecystectomy with stable nodular enlargement of the left adrenal gland  Assessment and Plan:  #1 intractable nausea with vomiting: Patient will be admitted for observation.  Workup will continue.  She has history of chronic pancreatitis but no evidence of  pancreatitis at the moment.  The cause could be diabetic gastroparesis.  Could also be acute viral illness.  She has been sick with some viral illness all week.  I will check acute viral screen.  Will also check patient for possible gastroparesis.  If symptoms persist.  #2 uncontrolled diabetes: Initiate sliding scale insulin continue other supportive care  #3 essential hypertension: Continue blood pressure medication.  #4 tobacco dependence:  Counseled.  Offered nicotine patch  #5 chronic pancreatitis: CT shows no evidence of pancreatitis at the moment.  Patient however having significant abdominal pain.  Continue pain control  #6 morbid obesity: Dietary counseling  #7 leukocytosis: Probably secondary to inflammation.  Continue workup.    Advance Care Planning:   Code Status: Prior full code  Consults: None  Family Communication: No family at bedside  Severity of Illness: The appropriate patient status for this patient is INPATIENT. Inpatient status is judged to be reasonable and necessary in order to provide the required intensity of service to ensure the patient's safety. The patient's presenting symptoms, physical exam findings, and initial radiographic and laboratory data in the context of their chronic comorbidities is felt to place them at high risk for further clinical deterioration. Furthermore, it is not anticipated that the patient will be medically stable for discharge from the hospital within 2 midnights of admission.   * I certify that at the point of admission it is my clinical judgment that the patient will require inpatient hospital care spanning beyond 2 midnights from the point of admission due to high intensity of service, high risk for further deterioration and high frequency of surveillance required.*  AuthorLonia Blood, MD 10/05/2023 6:51 PM  For on call review [email or web address].

## 2023-10-05 NOTE — ED Provider Notes (Signed)
 [facility]    CSN: [id number] Arrival date & time: 10/05/23  4098      History   Chief Complaint Chief Complaint  Patient presents with   Emesis    HPI [name] is a 58 y.o. female.   Patient complains of vomiting.  Patient has a history of diabetes, cyclical vomiting syndrome abdominal pain and GERD.  Patient has had pancreatitis in the past.  Patient reports [name] has vomited multiple times today.  Patient denies any fever or chills.   Emesis   Past Medical History:  Diagnosis Date   Anxiety    Arthritis    rheumatoid , osteoarthritis   Bronchitis    Chronic back pain    Depression    Diabetes mellitus    Fatty liver    Fibromyalgia    GERD (gastroesophageal reflux disease)    Headache    Hypertension    Pancreatitis    Pneumonia    Rotator cuff tear, right     Patient Active Problem List   Diagnosis Date Noted   Severe obesity (BMI 35.0-35.9 with comorbidity) (HCC) 06/16/2023   GERD (gastroesophageal reflux disease) 08/14/2022   HLD (hyperlipidemia) 08/14/2022   Pain of upper abdomen 06/02/2022   Adrenal adenoma, left 09/13/2021   Intractable nausea and vomiting 09/12/2021   Anxiety 09/12/2021   Gastroenteritis 09/09/2021   Type 2 diabetes mellitus with obesity (HCC) 09/09/2021   Class 2 obesity due to excess calories with body mass index (BMI) of 36.0 to 36.9 in adult 09/09/2021   Marijuana abuse 09/09/2021   Tobacco dependence 09/09/2021   Essential hypertension 05/29/2020   Abdominal pain 11/23/2019   Acute pancreatitis 11/21/2019   De Quervain's syndrome (tenosynovitis) 07/06/2018   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   Pain 07/06/2018   Complete tear of right rotator cuff 07/22/2017    Class: Chronic   Retained orthopedic hardware 07/22/2017    Class: Chronic   Cervical disc herniation 07/21/2017    Class: Acute   Status post cervical spinal fusion 07/21/17 07/21/2017   Chronic back pain     Past Surgical History:   Procedure Laterality Date   ABDOMINAL HYSTERECTOMY     ANTERIOR CERVICAL DECOMP/DISCECTOMY FUSION N/A 07/21/2017   Procedure: ANTERIOR CERVICAL DISCECTOMY AND FUSION C6-7, REMOVAL OF SCREW C6 PLATE, DEPUY ZERO-P IMPLANT, LOCAL BONE GRAFT, ALLOGRAFT BONE GRAFT, VIVIGEN;  Surgeon: Kerrin Champagne, MD;  Location: [facility] OR;  Service: Orthopedics;  Laterality: N/A;   BACK SURGERY     laminectomy   BONE GRAFT HIP ILIAC CREST     BREAST BIOPSY     CARPAL TUNNEL RELEASE     CERVICAL DISCECTOMY  07/21/2017   CESAREAN SECTION     CHOLECYSTECTOMY     COLONOSCOPY     frontal fusion     neck fusion     patient reported   SHOULDER ARTHROSCOPY WITH SUBACROMIAL DECOMPRESSION, ROTATOR CUFF REPAIR AND BICEP TENDON REPAIR Right 10/31/2017   Procedure: RIGHT SHOULDER ARTHROSCOPY, MANIPULATION UNDER ANESTHESIA, BICEPS TENODESIS, AND MINI OPEN ROTATOR CUFF TEAR REPAIR;  Surgeon: Cammy Copa, MD;  Location: [facility] OR;  Service: Orthopedics;  Laterality: Right;    OB History   No obstetric history on file.      Home Medications    Prior to Admission medications   Medication Sig Start Date End Date Taking? Authorizing Provider  albuterol (PROVENTIL) (2.5 MG/3ML) 0.083% nebulizer solution Take 3 mLs (2.5 mg total) by nebulization every 6 (six) hours as needed  for wheezing or shortness of breath. 12/12/19  Yes Yu, Amy V, PA-C  albuterol (VENTOLIN HFA) 108 (90 Base) MCG/ACT inhaler Inhale 2 puffs into the lungs every 4 (four) hours as needed for wheezing. Patient taking differently: Inhale 2 puffs into the lungs every 4 (four) hours as needed for wheezing. Sunday. 06/07/22  Yes Tomi Bamberger, PA-C  ALPRAZolam Prudy Feeler) 1 MG tablet Take 1 mg by mouth daily as needed for anxiety. 10/23/19  Yes [provider]  amLODipine-olmesartan (AZOR) 5-40 MG tablet Take 1 tablet by mouth daily. 06/05/21  Yes [provider]  aspirin EC 81 MG tablet Take 81 mg by mouth daily. Swallow whole.   Yes [provider]  fluticasone (FLONASE) 50 MCG/ACT nasal spray Place 1 spray into both nostrils daily as needed for allergies or rhinitis.   Yes [provider]  HUMALOG MIX 75/25 KWIKPEN (75-25) 100 UNIT/ML KwikPen Inject into the skin as directed.   Yes [provider]  metoprolol succinate (TOPROL-XL) 50 MG 24 hr tablet Take 50 mg by mouth daily. 07/21/14  Yes [provider]  pantoprazole (PROTONIX) 40 MG tablet Take 40 mg by mouth 2 (two) times daily. 01/15/22  Yes [provider]  simvastatin (ZOCOR) 20 MG tablet Take 20 mg by mouth daily. 08/22/20  Yes [provider]  ACCU-CHEK GUIDE TEST test strip 1 each by Other route as needed. 06/16/23 06/15/24  [provider]  Accu-Chek Softclix Lancets lancets by Other route 2 (two) times daily.    [provider]  amitriptyline (ELAVIL) 10 MG tablet Take 10 mg by mouth as directed. Patient not taking: Reported on 08/25/2023 03/30/18   [provider]  BAQSIMI TWO PACK 3 MG/DOSE POWD Place into both nostrils. Patient not taking: Reported on 08/25/2023    [provider]  Blood Glucose Monitoring Suppl (ACCU-CHEK GUIDE) w/Device KIT See admin instructions. 06/16/23   [provider]  Continuous Glucose Sensor (FREESTYLE LIBRE 3 PLUS SENSOR) MISC Change every 15 days 06/16/23   [provider]  Continuous Glucose Sensor (FREESTYLE LIBRE 3 SENSOR) MISC USE SENSOR AND REPLACE EVERY 14 DAYS 05/27/23   [provider]  CREON 36000-114000 units CPEP capsule Take 36,000 Units by mouth daily. Patient not taking: Reported on 12/27/2021 10/16/21   [provider]  cyclobenzaprine (FLEXERIL) 10 MG tablet Take 1 tablet (10 mg total) by mouth 2 (two) times daily as needed for muscle spasms. Patient taking differently: Take 10 mg by mouth 2 (two) times daily as needed for muscle spasms. Sunday. 06/02/23   Tomi Bamberger, PA-C  diclofenac (VOLTAREN) 50 MG EC  tablet TAKE 1 TABLET(50 MG) BY MOUTH TWICE DAILY Patient not taking: Reported on 10/05/2023 09/25/23   London Sheer, MD  dicyclomine (BENTYL) 10 MG capsule Take 10 mg by mouth daily. Patient not taking: Reported on 10/05/2023 10/16/21   [provider]  escitalopram (LEXAPRO) 20 MG tablet Take 20 mg by mouth daily. Patient not taking: Reported on 08/25/2023 08/10/20   [provider]  etodolac (LODINE) 400 MG tablet Take 1 tablet (400 mg total) by mouth 2 (two) times daily. 09/16/23   Marita Kansas, PA-C  Ferrous Sulfate (IRON PO) Take by mouth. Patient not taking: Reported on 08/25/2023    [provider]  gabapentin (NEURONTIN) 100 MG capsule Take 1 capsule (100 mg total) by mouth 3 (three) times daily. Patient not taking: Reported on 08/25/2023 06/09/23 07/09/23  Netta Corrigan, PA-C  Insulin  Lispro Prot & Lispro (HUMALOG MIX 75/25 KWIKPEN) (75-25) 100 UNIT/ML Kwikpen Inject 80 units in the morning and 70 units in the evening 08/09/23     Insulin Pen Needle 32G X 4 MM MISC 1 each by Does not apply route 3 (three) times daily. 09/08/22   Burnadette Pop, MD  lidocaine (LIDODERM) 5 % Place 1 patch onto the skin daily. Remove & Discard patch within 12 hours or as directed by MD 06/09/23   Netta Corrigan, PA-C  metoCLOPramide (REGLAN) 10 MG tablet Take 1 tablet (10 mg total) by mouth every 8 (eight) hours as needed for nausea. Patient not taking: Reported on 08/25/2023 09/08/22   Burnadette Pop, MD  nicotine (NICODERM CQ - DOSED IN MG/24 HOURS) 14 mg/24hr patch Place 1 patch (14 mg total) onto the skin daily. Patient not taking: Reported on 10/05/2023 09/09/22   Burnadette Pop, MD  ondansetron (ZOFRAN-ODT) 4 MG disintegrating tablet Take 1 tablet (4 mg total) by mouth every 8 (eight) hours as needed for nausea or vomiting. 03/02/23   Sabas Sous, MD  polyethylene glycol (MIRALAX / GLYCOLAX) 17 g packet Take 17 g by mouth daily as needed. Patient not taking: Reported on  10/05/2023 09/08/22   Burnadette Pop, MD  promethazine (PHENERGAN) 25 MG tablet Take 1 tablet (25 mg total) by mouth every 6 (six) hours as needed for nausea or vomiting. Patient not taking: Reported on 10/05/2023 05/17/23   Tegeler, Canary Brim, MD  sucralfate (CARAFATE) 1 g tablet Take 1 tablet (1 g total) by mouth 4 (four) times daily -  with meals and at bedtime. Patient not taking: Reported on 08/25/2023 02/24/23   Gerhard Munch, MD    Family History Family History  Problem Relation Age of Onset   Hypertension Father    Renal Disease Father    Diabetes Mother    Hypertension Mother    Edema Mother    Diabetes Sister    Diabetes Brother     Social History Social History   Tobacco Use   Smoking status: Every Day    Current packs/day: 0.50    Average packs/day: 0.5 packs/day for 40.0 years (20.0 ttl pk-yrs)    Types: Cigarettes   Smokeless tobacco: Never  Vaping Use   Vaping status: Never Used  Substance Use Topics   Alcohol use: No   Drug use: Yes    Types: Marijuana    Comment: told [name] had cannabinoid hyperemsis syndrome so stopped smoking THC, last use maybe 6 weeks ago     Allergies   Patient has no known allergies.   Review of Systems Review of Systems  Gastrointestinal:  Positive for vomiting.  All other systems reviewed and are negative.    Physical Exam Triage Vital Signs ED Triage Vitals  Encounter Vitals Group     BP 10/05/23 1102 (!) 153/77     Systolic BP Percentile --      Diastolic BP Percentile --      Pulse Rate 10/05/23 1102 (!) 107     Resp 10/05/23 1102 20     Temp 10/05/23 1102 98.2 F (36.8 C)     Temp Source 10/05/23 1102 Oral     SpO2 10/05/23 1102 92 %     Weight --      Height --      Head Circumference --      Peak Flow --      Pain Score 10/05/23 1108 5     Pain Loc --  Pain Education --      Exclude from Growth Chart --    No data found.  Updated Vital Signs BP (!) 153/77 (BP Location: Left Arm)   Pulse (!)  114   Temp 98.2 F (36.8 C) (Oral)   Resp 20   SpO2 94%   Visual Acuity Right Eye Distance:   Left Eye Distance:   Bilateral Distance:    Right Eye Near:   Left Eye Near:    Bilateral Near:     Physical Exam Vitals and nursing note reviewed.  Constitutional:      Appearance: [name] is well-developed.  HENT:     Head: Normocephalic.  Cardiovascular:     Rate and Rhythm: Tachycardia present.  Pulmonary:     Effort: Pulmonary effort is normal.  Abdominal:     General: There is no distension.  Musculoskeletal:        General: Normal range of motion.     Cervical back: Normal range of motion.  Skin:    General: Skin is warm.  Neurological:     General: No focal deficit present.     Mental Status: [name] is alert and oriented to person, place, and time.      UC Treatments / Results  Labs (all labs ordered are listed, but only abnormal results are displayed) Labs Reviewed  POCT FASTING CBG [facility] MANUAL ENTRY - Abnormal; Notable for the following components:      Result Value   POCT Glucose ([facility]) 203 (*)    All other components within normal limits    EKG   Radiology No results found.  Procedures Procedures (including critical care time)  Medications Ordered in UC Medications  ondansetron (ZOFRAN-ODT) disintegrating tablet 4 mg (4 mg Oral Given 10/05/23 1135)  metoCLOPramide (REGLAN) injection 10 mg (10 mg Intramuscular Given 10/05/23 1131)    Initial Impression / Assessment and Plan / UC Course  I have reviewed the triage vital signs and the nursing notes.  Pertinent labs & imaging results that were available during my care of the patient were reviewed by me and considered in my medical decision making (see chart for details).     Patient given ODT Zofran and Reglan IM.  Patient reports persistent nausea and vomiting.  Patient has persistent abdominal pain.  I counseled patient on limitations of urgent care.  I have advised her to go to the emergency department  where there are more treatments available. Final Clinical Impressions(s) / UC Diagnoses   Final diagnoses:  Nausea and vomiting, unspecified vomiting type   Discharge Instructions   None    ED Prescriptions   None    PDMP not reviewed this encounter. An After Visit Summary was printed and given to the patient.    Elson Areas, [place] 10/05/23 1215

## 2023-10-05 NOTE — ED Notes (Signed)
 Patient is being discharged from the Urgent Care and sent to the Emergency Department via pov . Per Langston Masker, PA-C, patient is in need of higher level of care due to vomiting and abdominal pain. Patient is aware and verbalizes understanding of plan of care. States her daughter will drive her. Vitals:   10/05/23 1102 10/05/23 1106  BP: (!) 153/77   Pulse: (!) 107 (!) 114  Resp: 20   Temp: 98.2 F (36.8 C)   SpO2: 92% 94%

## 2023-10-06 DIAGNOSIS — J441 Chronic obstructive pulmonary disease with (acute) exacerbation: Secondary | ICD-10-CM | POA: Diagnosis not present

## 2023-10-06 DIAGNOSIS — E669 Obesity, unspecified: Secondary | ICD-10-CM | POA: Diagnosis not present

## 2023-10-06 DIAGNOSIS — E1169 Type 2 diabetes mellitus with other specified complication: Secondary | ICD-10-CM | POA: Diagnosis not present

## 2023-10-06 DIAGNOSIS — R112 Nausea with vomiting, unspecified: Secondary | ICD-10-CM | POA: Diagnosis not present

## 2023-10-06 LAB — COMPREHENSIVE METABOLIC PANEL
ALT: 21 U/L (ref 0–44)
AST: 20 U/L (ref 15–41)
Albumin: 3.8 g/dL (ref 3.5–5.0)
Alkaline Phosphatase: 97 U/L (ref 38–126)
Anion gap: 10 (ref 5–15)
BUN: 16 mg/dL (ref 6–20)
CO2: 25 mmol/L (ref 22–32)
Calcium: 8.7 mg/dL — ABNORMAL LOW (ref 8.9–10.3)
Chloride: 101 mmol/L (ref 98–111)
Creatinine, Ser: 0.63 mg/dL (ref 0.44–1.00)
GFR, Estimated: >60 mL/min (ref >60)
Glucose, Bld: 242 mg/dL — ABNORMAL HIGH (ref 70–99)
Potassium: 4 mmol/L (ref 3.5–5.1)
Sodium: 136 mmol/L (ref 135–145)
Total Bilirubin: 0.7 mg/dL (ref 0.0–1.2)
Total Protein: 7.2 g/dL (ref 6.5–8.1)

## 2023-10-06 LAB — GLUCOSE, CAPILLARY
Glucose-Capillary: 234 mg/dL — ABNORMAL HIGH (ref 70–99)
Glucose-Capillary: 214 mg/dL — ABNORMAL HIGH (ref 70–99)
Glucose-Capillary: 287 mg/dL — ABNORMAL HIGH (ref 70–99)
Glucose-Capillary: 277 mg/dL — ABNORMAL HIGH (ref 70–99)

## 2023-10-06 LAB — CBC
HCT: 39.1 % (ref 36.0–46.0)
Hemoglobin: 12.7 g/dL (ref 12.0–15.0)
MCH: 27.7 pg (ref 26.0–34.0)
MCHC: 32.5 g/dL (ref 30.0–36.0)
MCV: 85.2 fL (ref 80.0–100.0)
Platelets: 364 10*3/uL (ref 150–400)
RBC: 4.59 MIL/uL (ref 3.87–5.11)
RDW: 14.4 % (ref 11.5–15.5)
WBC: 14 10*3/uL — ABNORMAL HIGH (ref 4.0–10.5)
nRBC: 0 % (ref 0.0–0.2)

## 2023-10-06 LAB — HEMOGLOBIN A1C
Hgb A1c MFr Bld: 9 % — ABNORMAL HIGH (ref 4.8–5.6)
Mean Plasma Glucose: 211.6 mg/dL

## 2023-10-06 LAB — RESP PANEL BY RT-PCR (RSV, FLU A&B, COVID)  RVPGX2
Influenza A by PCR: NEGATIVE
Influenza B by PCR: NEGATIVE
Resp Syncytial Virus by PCR: NEGATIVE
SARS Coronavirus 2 by RT PCR: NEGATIVE

## 2023-10-06 LAB — HIV ANTIBODY (ROUTINE TESTING W REFLEX): HIV Screen 4th Generation wRfx: NONREACTIVE

## 2023-10-06 MED ORDER — ENOXAPARIN SODIUM 40 MG/0.4ML IJ SOSY
40.0000 mg | PREFILLED_SYRINGE | INTRAMUSCULAR | Status: DC
Start: 2023-10-06 — End: 2023-10-09
  Administered 2023-10-06 – 2023-10-08 (×3): 40 mg via SUBCUTANEOUS
  Filled 2023-10-06 (×3): qty 0.4

## 2023-10-06 MED ORDER — ALBUTEROL SULFATE (2.5 MG/3ML) 0.083% IN NEBU
2.5000 mg | INHALATION_SOLUTION | Freq: Three times a day (TID) | RESPIRATORY_TRACT | Status: DC
  Administered 2023-10-07 – 2023-10-08 (×6): 2.5 mg via RESPIRATORY_TRACT
  Filled 2023-10-06 (×7): qty 3

## 2023-10-06 MED ORDER — PREDNISONE 5 MG PO TABS
10.0000 mg | ORAL_TABLET | Freq: Every day | ORAL | Status: DC
  Administered 2023-10-06: 10 mg via ORAL
  Filled 2023-10-06: qty 2

## 2023-10-06 MED ORDER — SENNOSIDES-DOCUSATE SODIUM 8.6-50 MG PO TABS
2.0000 | ORAL_TABLET | Freq: Two times a day (BID) | ORAL | Status: DC
  Administered 2023-10-06 – 2023-10-08 (×5): 2 via ORAL
  Filled 2023-10-06 (×7): qty 2

## 2023-10-06 MED ORDER — OXYCODONE HCL 5 MG PO TABS
5.0000 mg | ORAL_TABLET | ORAL | Status: DC | PRN
  Administered 2023-10-06 (×2): 10 mg via ORAL
  Administered 2023-10-07: 5 mg via ORAL
  Administered 2023-10-07: 10 mg via ORAL
  Administered 2023-10-08: 5 mg via ORAL
  Administered 2023-10-09 (×2): 10 mg via ORAL
  Filled 2023-10-06 (×3): qty 2
  Filled 2023-10-06 (×2): qty 1
  Filled 2023-10-06 (×6): qty 2

## 2023-10-06 MED ORDER — NICOTINE 14 MG/24HR TD PT24
14.0000 mg | MEDICATED_PATCH | Freq: Every day | TRANSDERMAL | Status: DC
  Administered 2023-10-06 – 2023-10-09 (×4): 14 mg via TRANSDERMAL
  Filled 2023-10-06 (×4): qty 1

## 2023-10-06 MED ORDER — ONDANSETRON 4 MG PO TBDP
4.0000 mg | ORAL_TABLET | Freq: Three times a day (TID) | ORAL | Status: DC | PRN
  Administered 2023-10-06 – 2023-10-09 (×5): 4 mg via ORAL
  Filled 2023-10-06 (×5): qty 1

## 2023-10-06 MED ORDER — ALBUTEROL SULFATE (2.5 MG/3ML) 0.083% IN NEBU
2.5000 mg | INHALATION_SOLUTION | Freq: Four times a day (QID) | RESPIRATORY_TRACT | Status: DC
  Administered 2023-10-06 (×2): 2.5 mg via RESPIRATORY_TRACT
  Filled 2023-10-06 (×2): qty 3

## 2023-10-06 NOTE — Plan of Care (Signed)
  Problem: Education: Goal: Knowledge of General Education information will improve Description: Including pain rating scale, medication(s)/side effects and non-pharmacologic comfort measures Outcome: Progressing   Problem: Clinical Measurements: Goal: Ability to maintain clinical measurements within normal limits will improve Outcome: Progressing Goal: Respiratory complications will improve Outcome: Progressing   

## 2023-10-06 NOTE — Progress Notes (Signed)
 Community resources provided for food, housing, transportation and utilities, added to [employer]

## 2023-10-06 NOTE — Care Management Obs Status (Signed)
 MEDICARE OBSERVATION STATUS NOTIFICATION   Patient Details  Name: [name] MRN: [id number] Date of Birth: 22-Dec-1965   Medicare Observation Status Notification Given:  Yes    Howell Rucks, RN 10/06/2023, 10:31 AM

## 2023-10-06 NOTE — Progress Notes (Signed)
 PROGRESS NOTE  [name]  ZOX:[id number] DOB: 01-12-1966 DOA: 10/05/2023 PCP: Fleet Contras, MD  Consultants  Brief Narrative: 58 y.o. female with a PMH significant for anxiety disorder type 2 diabetes, GERD, essential hypertension, chronic pancreatitis depression, fibromyalgia, rheumatoid arthritis, chronic back pain who presented to the ER with nausea vomiting and chills also congestion. Patient has been vomiting nonstop. Also abdominal pain will evaluate that is rated as 10 out of 10. She was treated by her PCP for "she is a diabetic and blood sugar has been elevated. No fever no chills. Patient is being admitted for observation and intractable nausea with vomiting.    Assessment & Plan: Intractable nausea with vomiting:  -History of chronic pancreatitis.  Also with known diabetes.  Working diagnosis of pancreatitis versus gastroparesis. -Normal lipase.  Making pancreatitis less likely. -Also with recent URI/viral illness that is ongoing en caul COPD exacerbation, see below. -So could be secondary to viral illness. -Regardless, symptomatic treatment at this time.   -Slowly improving.  She is hungry and so advanced her to full liquid diet and can advance her diet as tolerated.  COPD exacerbation: -Triggered by viral URI. -She is currently on oxygen.  Will attempt to wean.  Not on oxygen at home. -Granddaughter with same symptoms. -Could possibly be also contributing to nausea and vomiting as above.  COVID/flu are negative. -Starting treatment for COPD exacerbation: Nebulizers, prednisone. -Longstanding smoker, nicotine patch given   Uncontrolled diabetes, insulin-dependent.:  -A1c is 9. -On sliding scale insulin. -Reportedly on 80 units of Humalog mix in AM and 70 units in the evening.  So she will likely need some long-acting insulin here. -Will watch sugars once you know she is taking consistent p.o. and start long-acting insulin.  -Recommend SGLT2/GLP-1 at discharge to  supplement insulin.   Essential hypertension:  -Systolics 140s to 150s here.  She is on IV Lopressor as needed. -She takes combination of amlodipine/olmesartan. -Can restart her home blood pressure medicines once we know that she is taking p.o. well.   Tobacco dependence:  -Nicotine patch today.   Chronic pancreatitis:  - CT shows no evidence of pancreatitis at the moment.  Patient however having significant abdominal pain.   - Continue pain control as above   Morbid obesity:  - Dietary counseling   Leukocytosis:  -Persist.  Working diagnosis secondary to inflammation. -With viral URI.  Will trend      DVT prophylaxis:  enoxaparin (LOVENOX) injection 40 mg Start: 10/06/23 2200  Code Status:   Code Status: Full Code Level of care: Med-Surg Status is: Inpatient Dispo: Remains in house for intractable nausea vomiting and COPD exacerbation treatment.   Subjective: Feels somewhat better this morning than yesterday.  Still with epigastric pain.  No vomiting this a.m.  Last was slight yesterday evening.  No diarrhea.  No diarrhea preceding hospitalization either.  She is feeling hungry and would like to try some food.  Also with very persistent cough.  Productive of phlegm.  No fevers but does feel subjectively hot.  Objective: Vitals:   10/05/23 2117 10/06/23 0147 10/06/23 0502 10/06/23 0901  BP: (!) 161/101 (!) 148/97 (!) 151/89 (!) 155/85  Pulse: (!) 125 (!) 116 (!) 107 (!) 108  Resp: 18 17 16 17   Temp: 98.2 F (36.8 C) 98.6 F (37 C) 97.7 F (36.5 C) 98.3 F (36.8 C)  TempSrc: Oral Oral Oral Oral  SpO2: 90% 98% 98% 97%  Weight:      Height:  Intake/Output Summary (Last 24 hours) at 10/06/2023 1146 Last data filed at 10/06/2023 1000 Gross per 24 hour  Intake 1241.61 ml  Output 500 ml  Net 741.61 ml   Filed Weights   10/05/23 1303 10/05/23 1312  Weight: 104.3 kg 104.3 kg   Body mass index is 38.27 kg/m.  Gen: 58 y.o. female in no apparent distress.   Nontoxic Pulm: Non-labored breathing.  Diffuse wheezing in all lung fields. CV: Regular rate and rhythm. No murmur, rub, or gallop. No JVD GI: Abdomen soft, not.  Tender to palpation in midepigastrium. Ext: Warm, no deformities, no pedal edema.  Obese Skin: No rashes, lesions no ulcers Neuro: Alert and oriented. No focal neurological deficits. Psych: Calm  Judgement and insight appear normal. Mood & affect appropriate.     I have personally reviewed the following labs and images: CBC: Recent Labs  Lab 10/05/23 1335 10/06/23 0449  WBC 13.2* 14.0*  NEUTROABS 9.7*  --   HGB 15.4* 12.7  HCT 47.1* 39.1  MCV 84.3 85.2  PLT 433* 364   BMP &GFR Recent Labs  Lab 10/05/23 1335 10/06/23 0449  NA 139 136  K 3.9 4.0  CL 103 101  CO2 22 25  GLUCOSE 247* 242*  BUN 15 16  CREATININE 0.72 0.63  CALCIUM 9.6 8.7*   Estimated Creatinine Clearance: 91.8 mL/min (by C-G formula based on SCr of 0.63 mg/dL). Liver & Pancreas: Recent Labs  Lab 10/05/23 1335 10/06/23 0449  AST 27 20  ALT 26 21  ALKPHOS 124 97  BILITOT 0.9 0.7  PROT 8.3* 7.2  ALBUMIN 4.4 3.8   Recent Labs  Lab 10/05/23 1335  LIPASE 29   No results for input(s): "AMMONIA" in the last 168 hours. Diabetic: Recent Labs    10/06/23 0449  HGBA1C 9.0*   Recent Labs  Lab 10/05/23 2120 10/06/23 0735  GLUCAP 324* 234*   Cardiac Enzymes: No results for input(s): "CKTOTAL", "CKMB", "CKMBINDEX", "TROPONINI" in the last 168 hours. No results for input(s): "PROBNP" in the last 8760 hours. Coagulation Profile: No results for input(s): "INR", "PROTIME" in the last 168 hours. Thyroid Function Tests: No results for input(s): "TSH", "T4TOTAL", "FREET4", "T3FREE", "THYROIDAB" in the last 72 hours. Lipid Profile: No results for input(s): "CHOL", "HDL", "LDLCALC", "TRIG", "CHOLHDL", "LDLDIRECT" in the last 72 hours. Anemia Panel: No results for input(s): "VITAMINB12", "FOLATE", "FERRITIN", "TIBC", "IRON", "RETICCTPCT" in  the last 72 hours. Urine analysis:    Component Value Date/Time   COLORURINE STRAW (A) 10/05/2023 1700   APPEARANCEUR HAZY (A) 10/05/2023 1700   LABSPEC 1.028 10/05/2023 1700   PHURINE 6.0 10/05/2023 1700   GLUCOSEU >=500 (A) 10/05/2023 1700   HGBUR NEGATIVE 10/05/2023 1700   BILIRUBINUR NEGATIVE 10/05/2023 1700   BILIRUBINUR negative 01/03/2023 0943   KETONESUR 80 (A) 10/05/2023 1700   PROTEINUR NEGATIVE 10/05/2023 1700   UROBILINOGEN 0.2 01/03/2023 0943   UROBILINOGEN 0.2 06/26/2015 1300   NITRITE NEGATIVE 10/05/2023 1700   LEUKOCYTESUR NEGATIVE 10/05/2023 1700   Sepsis Labs: Invalid input(s): "PROCALCITONIN", "LACTICIDVEN"  Microbiology: Recent Results (from the past 240 hours)  Resp panel by RT-PCR (RSV, Flu A&B, Covid) Anterior Nasal Swab     Status: None   Collection Time: 10/06/23  2:52 AM   Specimen: Anterior Nasal Swab  Result Value Ref Range Status   SARS Coronavirus 2 by RT PCR NEGATIVE NEGATIVE Final    Comment: (NOTE) SARS-CoV-2 target nucleic acids are NOT DETECTED.  The SARS-CoV-2 RNA is generally detectable in upper respiratory  specimens during the acute phase of infection. The lowest concentration of SARS-CoV-2 viral copies this assay can detect is 138 copies/mL. A negative result does not preclude SARS-Cov-2 infection and should not be used as the sole basis for treatment or other patient management decisions. A negative result may occur with  improper specimen collection/handling, submission of specimen other than nasopharyngeal swab, presence of viral mutation(s) within the areas targeted by this assay, and inadequate number of viral copies(<138 copies/mL). A negative result must be combined with clinical observations, patient history, and epidemiological information. The expected result is Negative.  Fact Sheet for Patients:  BloggerCourse.com  Fact Sheet for Healthcare Providers:   SeriousBroker.it  This test is no t yet approved or cleared by the [place] FDA and  has been authorized for detection and/or diagnosis of SARS-CoV-2 by FDA under an Emergency Use Authorization (EUA). This EUA will remain  in effect (meaning this test can be used) for the duration of the COVID-19 declaration under Section 564(b)(1) of the Act, 21 U.S.C.section 360bbb-3(b)(1), unless the authorization is terminated  or revoked sooner.       Influenza A by PCR NEGATIVE NEGATIVE Final   Influenza B by PCR NEGATIVE NEGATIVE Final    Comment: (NOTE) The Xpert Xpress SARS-CoV-2/FLU/RSV plus assay is intended as an aid in the diagnosis of influenza from Nasopharyngeal swab specimens and should not be used as a sole basis for treatment. Nasal washings and aspirates are unacceptable for Xpert Xpress SARS-CoV-2/FLU/RSV testing.  Fact Sheet for Patients: BloggerCourse.com  Fact Sheet for Healthcare Providers: SeriousBroker.it  This test is not yet approved or cleared by the [place] FDA and has been authorized for detection and/or diagnosis of SARS-CoV-2 by FDA under an Emergency Use Authorization (EUA). This EUA will remain in effect (meaning this test can be used) for the duration of the COVID-19 declaration under Section 564(b)(1) of the Act, 21 U.S.C. section 360bbb-3(b)(1), unless the authorization is terminated or revoked.     Resp Syncytial Virus by PCR NEGATIVE NEGATIVE Final    Comment: (NOTE) Fact Sheet for Patients: BloggerCourse.com  Fact Sheet for Healthcare Providers: SeriousBroker.it  This test is not yet approved or cleared by the [place] FDA and has been authorized for detection and/or diagnosis of SARS-CoV-2 by FDA under an Emergency Use Authorization (EUA). This EUA will remain in effect (meaning this test can be used) for  the duration of the COVID-19 declaration under Section 564(b)(1) of the Act, 21 U.S.C. section 360bbb-3(b)(1), unless the authorization is terminated or revoked.  Performed at [name], [address] [place], [place] [address]     Radiology Studies: CT ABDOMEN PELVIS W CONTRAST Result Date: 10/05/2023 CLINICAL DATA:  Abdominal pain.  Acute nonlocalized abdominal pain. EXAM: CT ABDOMEN AND PELVIS WITH CONTRAST TECHNIQUE: Multidetector CT imaging of the abdomen and pelvis was performed using the standard protocol following bolus administration of intravenous contrast. RADIATION DOSE REDUCTION: This exam was performed according to the departmental dose-optimization program which includes automated exposure control, adjustment of the mA and/or kV according to patient size and/or use of iterative reconstruction technique. CONTRAST:  OMNIPAQUE IOHEXOL 300 MG/ML  SOLN COMPARISON:  CT abdomen 05/15/2023 FINDINGS: Lower chest: Lung bases are clear. Hepatobiliary: Subtle hypodensity in the subcapsular RIGHT hepatic lobe is not changed from prior (image 11/series 2). No biliary duct dilatation. Postcholecystectomy. Pancreas: Pancreas is normal. No ductal dilatation. No pancreatic inflammation. Spleen: Normal spleen Adrenals/urinary tract: Nodule enlargement of the medial and lateral  limb of the LEFT adrenal gland not changed from comparison exam. More remote scan from 11/21/2019 also demonstrates nodule enlargement unchanged from prior. Favor benign nodularity. RIGHT adrenal gland normal. Kidneys, ureters and bladder normal. Stomach/Bowel: Stomach, small bowel, appendix, and cecum are normal. The colon and rectosigmoid colon are normal. Vascular/Lymphatic: Abdominal aorta is normal caliber. No periportal or retroperitoneal adenopathy. No pelvic adenopathy. Reproductive: Post hysterectomy.  Adnexa unremarkable Other: No free fluid. Musculoskeletal: Lower lumbar fusion. IMPRESSION: 1.  No acute findings in the abdomen pelvis. 2. Normal appendix. 3. Postcholecystectomy. 4. Stable nodular enlargement of the LEFT adrenal gland. Favor benign nodularity. Electronically Signed   By: Genevive Bi M.D.   On: 10/05/2023 16:59    Scheduled Meds:  albuterol  2.5 mg Nebulization Q6H   enoxaparin (LOVENOX) injection  40 mg Subcutaneous Q24H   insulin aspart  0-5 Units Subcutaneous QHS   insulin aspart  0-9 Units Subcutaneous TID WC   nicotine  14 mg Transdermal Daily   [START ON 10/07/2023] predniSONE  10 mg Oral Q breakfast   senna-docusate  2 tablet Oral BID   Continuous Infusions:  lactated ringers 125 mL/hr at 10/06/23 1038     LOS: 0 days   55 minutes with more than 50% spent in reviewing records, counseling patient/family and coordinating care.  Tobey Grim, MD [facility] [email or web address] 10/06/2023, 11:46 AM

## 2023-10-06 NOTE — Plan of Care (Signed)
 ?  Problem: Clinical Measurements: ?Goal: Ability to maintain clinical measurements within normal limits will improve ?Outcome: Progressing ?Goal: Will remain free from infection ?Outcome: Progressing ?Goal: Diagnostic test results will improve ?Outcome: Progressing ?  ?

## 2023-10-06 NOTE — Inpatient Diabetes Management (Addendum)
 Inpatient Diabetes Program Recommendations  AACE/ADA: New Consensus Statement on Inpatient Glycemic Control (2015)  Target Ranges:  Prepandial:   less than 140 mg/dL      Peak postprandial:   less than 180 mg/dL (1-2 hours)      Critically ill patients:  140 - 180 mg/dL    Latest Reference Range & Units 10/05/23 21:20 10/06/23 07:35  Glucose-Capillary 70 - 99 mg/dL 161 (H)  4 units Novolog  234 (H)  2 units Novolog   (H): Data is abnormally high    Admit with: Intractable nausea with vomiting   History: DM  Home DM Meds: Humalog 75/25 Insulin 80 units AM/ 70 units PM  Current Orders: Novolog Sensitive Correction Scale/ SSI (0-9 units) TID AC + HS     Current A1c Pending   MD- Note pt takes Humalog 75/25 Insulin BID at home  CBG 234 this AM  Please consider starting weight based basal insulin: Semglee 20 units Daily (0.2 units/kg)  Can restart Humalog 75/25 Insulin when pt goes home    ENDO: Fredia Sorrow, NP with [facility] Initial Consult 06/16/2023 Patient Instructions: Stop Lantus  Adjust 75/25 insulin 80 units with breakfast and 70 units in the evening  Follow up with diabetes education  Decrease carb intake. Decrease sugar and cream in coffee, stop juice, decrease sweet tea.  Continue Libre     --Will follow patient during hospitalization--  Ambrose Finland RN, MSN, CDCES Diabetes Coordinator Inpatient Glycemic Control Team Team Pager: [phone] (8a-5p)

## 2023-10-06 NOTE — TOC Initial Note (Signed)
 Transition of Care [facility]) - Initial/Assessment Note    Patient Details  Name: [name] MRN: [id number] Date of Birth: 09/18/1965  [facility] CM/SW Contact:    Howell Rucks, RN Phone Number: 10/06/2023, 10:36 AM  Clinical Narrative:  Met with pt at bedside to itnroduce role of TOC/NCM and review for dc planning, pt reports she has an established PCP and pharmacy, no current home care services, reports she lives alone with support from her children, home DME: cane, shower chair, grab bars in bathroom, chair lyft, reports she feels safe returning home with support from her family. MOON explained to pt, form signed, copy provided to patient. TOC will continue to follow.                  Expected Discharge Plan: Home/Self Care Barriers to Discharge: Continued Medical Work up   Patient Goals and CMS Choice Patient states their goals for this hospitalization and ongoing recovery are:: return home          Expected Discharge Plan and Services       Living arrangements for the past 2 months: Apartment                                      Prior Living Arrangements/Services Living arrangements for the past 2 months: Apartment Lives with:: Self Patient language and need for interpreter reviewed:: Yes Do you feel safe going back to the place where you live?: Yes      Need for Family Participation in Patient Care: Yes (Comment) Care giver support system in place?: Yes (comment) Current home services: DME (cane, shower chair, grab bars in bathroom, chair lyft) Criminal Activity/Legal Involvement Pertinent to Current Situation/Hospitalization: No - Comment as needed  Activities of Daily Living      Permission Sought/Granted                  Emotional Assessment Appearance:: Appears stated age Attitude/Demeanor/Rapport: Gracious Affect (typically observed): Accepting Orientation: : Oriented to Self, Oriented to Place, Oriented to  Time,  Oriented to Situation Alcohol / Substance Use: Not Applicable Psych Involvement: No (comment)  Admission diagnosis:  Hyperemesis [R11.10] Nausea and vomiting [R11.2] Patient Active Problem List   Diagnosis Date Noted   Nausea and vomiting 10/05/2023   Severe obesity (BMI 35.0-35.9 with comorbidity) (HCC) 06/16/2023   GERD (gastroesophageal reflux disease) 08/14/2022   HLD (hyperlipidemia) 08/14/2022   Pain of upper abdomen 06/02/2022   Adrenal adenoma, left 09/13/2021   Intractable nausea and vomiting 09/12/2021   Anxiety 09/12/2021   Gastroenteritis 09/09/2021   Type 2 diabetes mellitus with obesity (HCC) 09/09/2021   Class 2 obesity due to excess calories with body mass index (BMI) of 36.0 to 36.9 in adult 09/09/2021   Marijuana abuse 09/09/2021   Tobacco dependence 09/09/2021   Essential hypertension 05/29/2020   Abdominal pain 11/23/2019   Acute pancreatitis 11/21/2019   De Quervain's syndrome (tenosynovitis) 07/06/2018   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 [place], [place] - [address] [place] [address] Phone: [phone] Fax: [phone]  [id number] - [place], [place] - [address] AT [facility] [address] [facility] [address] [place] [address] Phone: [phone] Fax: [phone]     Social Drivers of Health (SDOH) Social History: SDOH Screenings   Food Insecurity: Food Insecurity  Present (10/06/2023)  Housing: High Risk (10/06/2023)  Transportation Needs: Unmet Transportation Needs (10/06/2023)  Utilities: At Risk (10/06/2023)  Social Connections: Unknown (03/21/2023)   Received from [facility]  Tobacco Use: High Risk (09/16/2023)   SDOH Interventions:     Readmission Risk Interventions    09/14/2021   10:52 AM  Readmission Risk Prevention Plan  Transportation Screening Complete  PCP or Specialist Appt within 3-5 Days Complete  HRI or Home Care Consult Complete  Palliative Care Screening Complete  Medication Review (RN Care Manager) Complete

## 2023-10-07 DIAGNOSIS — E1169 Type 2 diabetes mellitus with other specified complication: Secondary | ICD-10-CM | POA: Diagnosis not present

## 2023-10-07 DIAGNOSIS — R112 Nausea with vomiting, unspecified: Secondary | ICD-10-CM | POA: Diagnosis not present

## 2023-10-07 DIAGNOSIS — R1013 Epigastric pain: Secondary | ICD-10-CM

## 2023-10-07 DIAGNOSIS — E669 Obesity, unspecified: Secondary | ICD-10-CM | POA: Diagnosis not present

## 2023-10-07 LAB — CBC
HCT: 41.7 % (ref 36.0–46.0)
Hemoglobin: 13.4 g/dL (ref 12.0–15.0)
MCH: 28 pg (ref 26.0–34.0)
MCHC: 32.1 g/dL (ref 30.0–36.0)
MCV: 87.2 fL (ref 80.0–100.0)
Platelets: 340 10*3/uL (ref 150–400)
RBC: 4.78 MIL/uL (ref 3.87–5.11)
RDW: 14 % (ref 11.5–15.5)
WBC: 11.1 10*3/uL — ABNORMAL HIGH (ref 4.0–10.5)
nRBC: 0 % (ref 0.0–0.2)

## 2023-10-07 LAB — GLUCOSE, CAPILLARY
Glucose-Capillary: 211 mg/dL — ABNORMAL HIGH (ref 70–99)
Glucose-Capillary: 278 mg/dL — ABNORMAL HIGH (ref 70–99)
Glucose-Capillary: 419 mg/dL — ABNORMAL HIGH (ref 70–99)
Glucose-Capillary: 362 mg/dL — ABNORMAL HIGH (ref 70–99)
Glucose-Capillary: 305 mg/dL — ABNORMAL HIGH (ref 70–99)

## 2023-10-07 LAB — BASIC METABOLIC PANEL
Anion gap: 12 (ref 5–15)
BUN: 12 mg/dL (ref 6–20)
CO2: 26 mmol/L (ref 22–32)
Calcium: 8.9 mg/dL (ref 8.9–10.3)
Chloride: 101 mmol/L (ref 98–111)
Creatinine, Ser: 0.52 mg/dL (ref 0.44–1.00)
GFR, Estimated: >60 mL/min (ref >60)
Glucose, Bld: 166 mg/dL — ABNORMAL HIGH (ref 70–99)
Potassium: 3.9 mmol/L (ref 3.5–5.1)
Sodium: 139 mmol/L (ref 135–145)

## 2023-10-07 MED ORDER — INSULIN ASPART 100 UNIT/ML IJ SOLN
11.0000 [IU] | Freq: Once | INTRAMUSCULAR | Status: AC
  Administered 2023-10-07: 11 [IU] via SUBCUTANEOUS

## 2023-10-07 MED ORDER — ALPRAZOLAM 0.5 MG PO TABS
1.0000 mg | ORAL_TABLET | Freq: Every day | ORAL | Status: DC | PRN
  Administered 2023-10-07: 1 mg via ORAL
  Filled 2023-10-07: qty 2

## 2023-10-07 MED ORDER — INSULIN GLARGINE 100 UNIT/ML ~~LOC~~ SOLN
10.0000 [IU] | Freq: Every day | SUBCUTANEOUS | Status: DC
  Administered 2023-10-07 – 2023-10-08 (×2): 10 [IU] via SUBCUTANEOUS
  Filled 2023-10-07 (×2): qty 0.1

## 2023-10-07 MED ORDER — PREDNISONE 5 MG PO TABS
50.0000 mg | ORAL_TABLET | Freq: Every day | ORAL | Status: DC
  Administered 2023-10-07 – 2023-10-09 (×3): 50 mg via ORAL
  Filled 2023-10-07 (×3): qty 2

## 2023-10-07 NOTE — Progress Notes (Signed)
 PROGRESS NOTE  [name]  ZOX:[id number] DOB: 10/28/1965 DOA: 10/05/2023 PCP: Fleet Contras, MD  Consultants  Brief Narrative: 58 y.o. female with a PMH significant for anxiety disorder type 2 diabetes, GERD, essential hypertension, chronic pancreatitis depression, fibromyalgia, rheumatoid arthritis, chronic back pain who presented to the ER with nausea vomiting and chills also congestion. Patient has been vomiting nonstop. Also abdominal pain will evaluate that is rated as 10 out of 10. She was treated by her PCP for "she is a diabetic and blood sugar has been elevated. No fever no chills. Patient is being admitted for observation and intractable nausea with vomiting.  Slowly improving   Assessment & Plan: Intractable nausea with vomiting:  -History of chronic pancreatitis.  Also with known diabetes.  Working diagnosis of pancreatitis versus gastroparesis. -Normal lipase.  Making pancreatitis less likely. -Also with recent URI/viral illness that is ongoing en caul COPD exacerbation, see below. -So could be secondary to viral illness. -Regardless, symptomatic treatment at this time.   -I think we advance her diet little too quickly yesterday.  She evidently had solid foods at lunch.  Pain had pretty much been gone prior to that.  Had a recurrence of pain about an hour after eating that has persisted from yesterday through today.  Plan to keep her on soft diet today.  We did discuss moving back to full liquids but she would like to at least attempt soft. -Continue supportive therapy.  COPD exacerbation: -Triggered by viral URI. -She is currently on oxygen.  Will attempt to wean.  Not on oxygen at home. -Granddaughter with same symptoms. -Could possibly be also contributing to nausea and vomiting as above.  COVID/flu are negative. -Starting treatment for COPD exacerbation: Nebulizers, prednisone. -Longstanding smoker, nicotine patch given   Uncontrolled diabetes,  insulin-dependent.:  -A1c is 9. -On sliding scale insulin. -Reportedly on 80 units of Humalog mix in AM and 70 units in the evening.  Started on 10 units of Lantus today.  May need to be adjusted based on CBGs. -Prednisone 50 mg started today.  This has been started yesterday for some reason she was only started on 10 mg.  Watch CBGs on prednisone. -Recommend SGLT2 at discharge to supplement insulin (no GLP-1 b/c of pancreatitis). -Appreciate diabetic nurse input.   Essential hypertension:  -Systolics 140s to 150s here.  She is on IV Lopressor as needed. -She takes combination of amlodipine/olmesartan. -Can restart her home blood pressure medicines once we know that she is taking p.o. well.   Tobacco dependence:  -Nicotine patch today.   Chronic pancreatitis:  - CT shows no evidence of pancreatitis at the moment.  Patient however having significant abdominal pain.   - Continue pain control as above   Morbid obesity:  - Dietary counseling   Leukocytosis:  -Persist.  Working diagnosis secondary to inflammation. -With viral URI.  Will trend      DVT prophylaxis:  enoxaparin (LOVENOX) injection 40 mg Start: 10/06/23 2200  Code Status:   Code Status: Full Code Level of care: Med-Surg Status is: Inpatient Dispo: Remains in house for intractable nausea vomiting and COPD exacerbation treatment.  Slowly advance diet when tolerated.   Subjective: Felt better yesterday morning and then acute pain about an hour after eating lunch.  She had solid food at lunch.  No dinner last night because she was nauseous and pain.  She did have grits this morning which she had about half.  She would like to remain on at least a  soft diet.  Remains nauseous but no further vomiting since last night.  Still with wheezing and coughing with even minimal exertion.  Objective: Vitals:   10/06/23 1759 10/06/23 2017 10/06/23 2138 10/07/23 0607  BP: 121/66 134/79  137/89  Pulse: (!) 108 99  (!) 106  Resp: 18  16  16   Temp: 98.4 F (36.9 C) 98.2 F (36.8 C)  98.2 F (36.8 C)  TempSrc: Oral Oral  Oral  SpO2: 97% 99% 96% 96%  Weight:      Height:        Intake/Output Summary (Last 24 hours) at 10/07/2023 1308 Last data filed at 10/07/2023 1037 Gross per 24 hour  Intake 2958.39 ml  Output 400 ml  Net 2558.39 ml   Filed Weights   10/05/23 1303 10/05/23 1312  Weight: 104.3 kg 104.3 kg   Body mass index is 38.27 kg/m.  Gen: 58 y.o. female in no apparent distress.  Nontoxic Pulm: Non-labored breathing.  Diffuse wheezing in all lung fields. CV: Regular rate and rhythm on my exam, occasionally tachycardic based on vitals. GI: Abdomen soft, not distended.  Tender to palpation in midepigastrium. Ext: Warm, no deformities, no pedal edema.  Obese Skin: No rashes, lesions no ulcers Neuro: Alert and oriented. No focal neurological deficits. Psych: Calm  Judgement and insight appear normal. Mood & affect appropriate.     I have personally reviewed the following labs and images: CBC: Recent Labs  Lab 10/05/23 1335 10/06/23 0449 10/07/23 0440  WBC 13.2* 14.0* 11.1*  NEUTROABS 9.7*  --   --   HGB 15.4* 12.7 13.4  HCT 47.1* 39.1 41.7  MCV 84.3 85.2 87.2  PLT 433* 364 340   BMP &GFR Recent Labs  Lab 10/05/23 1335 10/06/23 0449 10/07/23 0440  NA 139 136 139  K 3.9 4.0 3.9  CL 103 101 101  CO2 22 25 26   GLUCOSE 247* 242* 166*  BUN 15 16 12   CREATININE 0.72 0.63 0.52  CALCIUM 9.6 8.7* 8.9   Estimated Creatinine Clearance: 91.8 mL/min (by C-G formula based on SCr of 0.52 mg/dL). Liver & Pancreas: Recent Labs  Lab 10/05/23 1335 10/06/23 0449  AST 27 20  ALT 26 21  ALKPHOS 124 97  BILITOT 0.9 0.7  PROT 8.3* 7.2  ALBUMIN 4.4 3.8   Recent Labs  Lab 10/05/23 1335  LIPASE 29   No results for input(s): "AMMONIA" in the last 168 hours. Diabetic: Recent Labs    10/06/23 0449  HGBA1C 9.0*   Recent Labs  Lab 10/06/23 1144 10/06/23 1644 10/06/23 2020 10/07/23 0722  10/07/23 1154  GLUCAP 214* 287* 277* 211* 278*   Cardiac Enzymes: No results for input(s): "CKTOTAL", "CKMB", "CKMBINDEX", "TROPONINI" in the last 168 hours. No results for input(s): "PROBNP" in the last 8760 hours. Coagulation Profile: No results for input(s): "INR", "PROTIME" in the last 168 hours. Thyroid Function Tests: No results for input(s): "TSH", "T4TOTAL", "FREET4", "T3FREE", "THYROIDAB" in the last 72 hours. Lipid Profile: No results for input(s): "CHOL", "HDL", "LDLCALC", "TRIG", "CHOLHDL", "LDLDIRECT" in the last 72 hours. Anemia Panel: No results for input(s): "VITAMINB12", "FOLATE", "FERRITIN", "TIBC", "IRON", "RETICCTPCT" in the last 72 hours. Urine analysis:    Component Value Date/Time   COLORURINE STRAW (A) 10/05/2023 1700   APPEARANCEUR HAZY (A) 10/05/2023 1700   LABSPEC 1.028 10/05/2023 1700   PHURINE 6.0 10/05/2023 1700   GLUCOSEU >=500 (A) 10/05/2023 1700   HGBUR NEGATIVE 10/05/2023 1700   BILIRUBINUR NEGATIVE 10/05/2023 1700   BILIRUBINUR  negative 01/03/2023 0943   KETONESUR 80 (A) 10/05/2023 1700   PROTEINUR NEGATIVE 10/05/2023 1700   UROBILINOGEN 0.2 01/03/2023 0943   UROBILINOGEN 0.2 06/26/2015 1300   NITRITE NEGATIVE 10/05/2023 1700   LEUKOCYTESUR NEGATIVE 10/05/2023 1700   Sepsis Labs: Invalid input(s): "PROCALCITONIN", "LACTICIDVEN"  Microbiology: Recent Results (from the past 240 hours)  Resp panel by RT-PCR (RSV, Flu A&B, Covid) Anterior Nasal Swab     Status: None   Collection Time: 10/06/23  2:52 AM   Specimen: Anterior Nasal Swab  Result Value Ref Range Status   SARS Coronavirus 2 by RT PCR NEGATIVE NEGATIVE Final    Comment: (NOTE) SARS-CoV-2 target nucleic acids are NOT DETECTED.  The SARS-CoV-2 RNA is generally detectable in upper respiratory specimens during the acute phase of infection. The lowest concentration of SARS-CoV-2 viral copies this assay can detect is 138 copies/mL. A negative result does not preclude  SARS-Cov-2 infection and should not be used as the sole basis for treatment or other patient management decisions. A negative result may occur with  improper specimen collection/handling, submission of specimen other than nasopharyngeal swab, presence of viral mutation(s) within the areas targeted by this assay, and inadequate number of viral copies(<138 copies/mL). A negative result must be combined with clinical observations, patient history, and epidemiological information. The expected result is Negative.  Fact Sheet for Patients:  BloggerCourse.com  Fact Sheet for Healthcare Providers:  SeriousBroker.it  This test is no t yet approved or cleared by the [place] FDA and  has been authorized for detection and/or diagnosis of SARS-CoV-2 by FDA under an Emergency Use Authorization (EUA). This EUA will remain  in effect (meaning this test can be used) for the duration of the COVID-19 declaration under Section 564(b)(1) of the Act, 21 U.S.C.section 360bbb-3(b)(1), unless the authorization is terminated  or revoked sooner.       Influenza A by PCR NEGATIVE NEGATIVE Final   Influenza B by PCR NEGATIVE NEGATIVE Final    Comment: (NOTE) The Xpert Xpress SARS-CoV-2/FLU/RSV plus assay is intended as an aid in the diagnosis of influenza from Nasopharyngeal swab specimens and should not be used as a sole basis for treatment. Nasal washings and aspirates are unacceptable for Xpert Xpress SARS-CoV-2/FLU/RSV testing.  Fact Sheet for Patients: BloggerCourse.com  Fact Sheet for Healthcare Providers: SeriousBroker.it  This test is not yet approved or cleared by the [place] FDA and has been authorized for detection and/or diagnosis of SARS-CoV-2 by FDA under an Emergency Use Authorization (EUA). This EUA will remain in effect (meaning this test can be used) for the duration of  the COVID-19 declaration under Section 564(b)(1) of the Act, 21 U.S.C. section 360bbb-3(b)(1), unless the authorization is terminated or revoked.     Resp Syncytial Virus by PCR NEGATIVE NEGATIVE Final    Comment: (NOTE) Fact Sheet for Patients: BloggerCourse.com  Fact Sheet for Healthcare Providers: SeriousBroker.it  This test is not yet approved or cleared by the [place] FDA and has been authorized for detection and/or diagnosis of SARS-CoV-2 by FDA under an Emergency Use Authorization (EUA). This EUA will remain in effect (meaning this test can be used) for the duration of the COVID-19 declaration under Section 564(b)(1) of the Act, 21 U.S.C. section 360bbb-3(b)(1), unless the authorization is terminated or revoked.  Performed at [facility], [address], [place], [place] [address]     Radiology Studies: No results found.   Scheduled Meds:  albuterol  2.5 mg Nebulization TID   enoxaparin (  LOVENOX) injection  40 mg Subcutaneous Q24H   insulin aspart  0-5 Units Subcutaneous QHS   insulin aspart  0-9 Units Subcutaneous TID WC   insulin glargine  10 Units Subcutaneous Daily   nicotine  14 mg Transdermal Daily   predniSONE  50 mg Oral Q breakfast   senna-docusate  2 tablet Oral BID   Continuous Infusions:     LOS: 0 days   35 minutes with more than 50% spent in reviewing records, counseling patient/family and coordinating care.  Tobey Grim, MD [facility] [email or web address] 10/07/2023, 1:08 PM

## 2023-10-07 NOTE — Progress Notes (Signed)
 BG 419. MD Gwendolyn Grant made aware. One time order for 11 units.

## 2023-10-07 NOTE — Inpatient Diabetes Management (Signed)
 Inpatient Diabetes Program Recommendations  AACE/ADA: New Consensus Statement on Inpatient Glycemic Control (2015)  Target Ranges:  Prepandial:   less than 140 mg/dL      Peak postprandial:   less than 180 mg/dL (1-2 hours)      Critically ill patients:  140 - 180 mg/dL    Latest Reference Range & Units 10/06/23 07:35 10/06/23 11:44 10/06/23 16:44 10/06/23 20:20  Glucose-Capillary 70 - 99 mg/dL 119 (H)  2 units Novolog  214 (H)  3 units Novolog  287 (H)  5 units Novolog  277 (H)  3 units Novolog   (H): Data is abnormally high  Latest Reference Range & Units 10/07/23 07:22 10/07/23 11:54  Glucose-Capillary 70 - 99 mg/dL 147 (H)  3 units Novolog  10 units Lantus @1146  278 (H)  (H): Data is abnormally high    Home DM Meds: Humalog 75/25 Insulin 80 units AM/ 70 units PM   Current Orders: Novolog Sensitive Correction Scale/ SSI (0-9 units) TID AC + HS     Lantus 10 units daily              MD- Note Lantus started this AM.  Prednisone 50 mg daily started this AM.  CBG 278 at Lunch today--Ate 75% breakfast this AM  Please consider Starting Novolog Meal Coverage:  Novolog 3 units TID with meals   Can restart Humalog 75/25 Insulin when pt goes home       ENDO: Fredia Sorrow, NP with [facility] Initial Consult 06/16/2023 Patient Instructions: Stop Lantus  Adjust 75/25 insulin 80 units with breakfast and 70 units in the evening  Follow up with diabetes education  Decrease carb intake. Decrease sugar and cream in coffee, stop juice, decrease sweet tea.  Continue [employer]

## 2023-10-08 ENCOUNTER — Encounter (HOSPITAL_COMMUNITY): Payer: Self-pay | Admitting: Internal Medicine

## 2023-10-08 DIAGNOSIS — E669 Obesity, unspecified: Secondary | ICD-10-CM | POA: Diagnosis not present

## 2023-10-08 DIAGNOSIS — R112 Nausea with vomiting, unspecified: Secondary | ICD-10-CM | POA: Diagnosis not present

## 2023-10-08 DIAGNOSIS — E1169 Type 2 diabetes mellitus with other specified complication: Secondary | ICD-10-CM | POA: Diagnosis not present

## 2023-10-08 LAB — BASIC METABOLIC PANEL
Anion gap: 10 (ref 5–15)
BUN: 15 mg/dL (ref 6–20)
CO2: 28 mmol/L (ref 22–32)
Calcium: 8.6 mg/dL — ABNORMAL LOW (ref 8.9–10.3)
Chloride: 98 mmol/L (ref 98–111)
Creatinine, Ser: 0.58 mg/dL (ref 0.44–1.00)
GFR, Estimated: >60 mL/min (ref >60)
Glucose, Bld: 206 mg/dL — ABNORMAL HIGH (ref 70–99)
Potassium: 3.8 mmol/L (ref 3.5–5.1)
Sodium: 136 mmol/L (ref 135–145)

## 2023-10-08 LAB — CBC
HCT: 39.2 % (ref 36.0–46.0)
Hemoglobin: 12.8 g/dL (ref 12.0–15.0)
MCH: 28 pg (ref 26.0–34.0)
MCHC: 32.7 g/dL (ref 30.0–36.0)
MCV: 85.8 fL (ref 80.0–100.0)
Platelets: 346 10*3/uL (ref 150–400)
RBC: 4.57 MIL/uL (ref 3.87–5.11)
RDW: 13.7 % (ref 11.5–15.5)
WBC: 14.6 10*3/uL — ABNORMAL HIGH (ref 4.0–10.5)
nRBC: 0 % (ref 0.0–0.2)

## 2023-10-08 LAB — GLUCOSE, CAPILLARY
Glucose-Capillary: 241 mg/dL — ABNORMAL HIGH (ref 70–99)
Glucose-Capillary: 374 mg/dL — ABNORMAL HIGH (ref 70–99)
Glucose-Capillary: 351 mg/dL — ABNORMAL HIGH (ref 70–99)
Glucose-Capillary: 345 mg/dL — ABNORMAL HIGH (ref 70–99)

## 2023-10-08 MED ORDER — ESCITALOPRAM OXALATE 20 MG PO TABS
20.0000 mg | ORAL_TABLET | Freq: Every day | ORAL | Status: DC
  Administered 2023-10-09: 20 mg via ORAL
  Filled 2023-10-08: qty 1

## 2023-10-08 MED ORDER — ALUM & MAG HYDROXIDE-SIMETH 200-200-20 MG/5ML PO SUSP
30.0000 mL | Freq: Once | ORAL | Status: DC
  Filled 2023-10-08: qty 30

## 2023-10-08 MED ORDER — FLUTICASONE PROPIONATE 50 MCG/ACT NA SUSP: 2.0000 | Freq: Every day | NASAL | Status: DC | PRN

## 2023-10-08 MED ORDER — ASPIRIN 81 MG PO TBEC
81.0000 mg | DELAYED_RELEASE_TABLET | Freq: Every day | ORAL | Status: DC
  Administered 2023-10-09: 81 mg via ORAL
  Filled 2023-10-08: qty 1

## 2023-10-08 MED ORDER — ALBUTEROL SULFATE (2.5 MG/3ML) 0.083% IN NEBU: 2.5000 mg | INHALATION_SOLUTION | Freq: Four times a day (QID) | RESPIRATORY_TRACT | Status: DC | PRN

## 2023-10-08 MED ORDER — LIDOCAINE 5 % EX PTCH
1.0000 | MEDICATED_PATCH | CUTANEOUS | Status: DC
  Administered 2023-10-08: 1 via TRANSDERMAL
  Filled 2023-10-08: qty 1

## 2023-10-08 MED ORDER — INSULIN ASPART PROT & ASPART (70-30 MIX) 100 UNIT/ML ~~LOC~~ SUSP
70.0000 [IU] | Freq: Two times a day (BID) | SUBCUTANEOUS | Status: DC
  Administered 2023-10-09: 70 [IU] via SUBCUTANEOUS
  Filled 2023-10-08: qty 10

## 2023-10-08 NOTE — Progress Notes (Signed)
 Progress Note   [name] ZOX:[id number] DOB: Jul 25, 1966 DOA: 10/05/2023     0 DOS: the patient was seen and examined on 10/08/2023   Brief hospital course: 58 year old woman PMH including diabetes mellitus type 2, chronic pancreatitis, fibromyalgia, depression, chronic back pain, hyperemesis secondary to [facility], presented with intractable nausea and vomiting as well as abdominal pain.  Admitted for same.  Early on developed COPD exacerbation.  Consultants None   Procedures/Events None   30 abdominal/abdominal and pelvis CTs on record, numerous other CTs.  Last seen by gastroenterology June 2023, see that note for details.  She was referred to that office for chronic abdominal pain. At that time PMH including cannabinoid hyperemesis syndrome.  Gastric emptying study was normal in 2020.  "At the time of her last visit with Dr. Luanna Kemp in 02/2021, the impression was as follows: [name]  Most likely, I believe the patient has some combination of irritable bowel syndrome as well as possible sequela from her multiple abdominal surgeries. Partial intermittent small bowel obstruction is on the differential although is not showing up when she goes into the emergency room for evaluation.   Assessment and Plan: Intractable nausea with vomiting:  Resolved, eating 2 trays per meal Etiology unclear, she has been seen by GI in the past for chronic abdominal pain and intractable nausea and vomiting.  Probably related to hyperemesis from cannabinoid use which is known. Anticipate discharge tomorrow.   COPD exacerbation Resolved.  Weaned off oxygen.   Uncontrolled diabetes, insulin-dependent.:  Hemoglobin A1c 9 Resume 75/25  insulin at 70 units twice daily, especially in light of marked hyperglycemia and prednisone.  This should improve her uncontrolled blood sugars.    Essential hypertension:    Tobacco dependence:  -Nicotine patch today.   Chronic pancreatitis:  - CT shows no evidence of pancreatitis Pain appears resolved   Morbid obesity:  - Dietary counseling  Anticipate discharge tomorrow    Subjective:  Feels better No vomiting Pain decreased Per nursing pt eating 2 trays per meal  Physical Exam: Vitals:   10/08/23 0700 10/08/23 1000 10/08/23 1321 10/08/23 1412  BP:   (!) 185/83   Pulse:   (!) 109   Resp:   18   Temp:   98.4 F (36.9 C)   TempSrc:   Oral   SpO2: 92% 94% 92% 92%  Weight:      Height:       Physical Exam Vitals reviewed.  Constitutional:      General: She is not in acute distress.    Appearance: She is not ill-appearing or toxic-appearing.  Cardiovascular:     Rate and Rhythm: Normal rate and regular rhythm.     Heart sounds: No murmur heard. Pulmonary:     Effort: Pulmonary effort is normal. No respiratory distress.     Breath sounds: No wheezing, rhonchi or rales.  Abdominal:     General: There is no distension.     Palpations: Abdomen is soft.     Tenderness: There is no abdominal tenderness. There is no guarding.  Neurological:     Mental Status: She is alert.  Psychiatric:        Mood and Affect: Mood normal.  Behavior: Behavior normal.     Data Reviewed: CBGs poorly controlled predominantly 200-300s. In general CMP and BMP is unremarkable.  Lipase was within normal limits on admission. CBC notable for mild leukocytosis.  Of note patient on steroids. Hemoglobin A1c 9.0. Negative for influenza, RSV and COVID CT abdomen pelvis no acute findings  Family Communication: none  Disposition: Status is: Observation      Time spent: 35 minutes  Author: Brendia Sacks, MD 10/08/2023 7:24 PM  For on call review [email or web address].

## 2023-10-08 NOTE — Progress Notes (Signed)
 Pt ordered/consumed 2 breakfast trays this am (omelet, sausage, french toast, fruit cup, 2 juices and coffee). Denies n/v at this time. Weaned to room air, as pt in mid-high 90's on O2. When trying to do assessment/meds, pt stated "leave me alone, I want to take a nap."

## 2023-10-08 NOTE — Plan of Care (Signed)
  Problem: Education: Goal: Knowledge of General Education information will improve Description: Including pain rating scale, medication(s)/side effects and non-pharmacologic comfort measures Outcome: Progressing   Problem: Clinical Measurements: Goal: Ability to maintain clinical measurements within normal limits will improve Outcome: Progressing   Problem: Elimination: Goal: Will not experience complications related to urinary retention Outcome: Progressing   

## 2023-10-08 NOTE — Plan of Care (Signed)
  Problem: Education: Goal: Knowledge of General Education information will improve Description: Including pain rating scale, medication(s)/side effects and non-pharmacologic comfort measures Outcome: Progressing   Problem: Health Behavior/Discharge Planning: Goal: Ability to manage health-related needs will improve Outcome: Progressing   Problem: Nutrition: Goal: Adequate nutrition will be maintained Outcome: Progressing   Problem: Coping: Goal: Level of anxiety will decrease Outcome: Progressing   Problem: Clinical Measurements: Goal: Ability to maintain clinical measurements within normal limits will improve Outcome: Not Progressing

## 2023-10-08 NOTE — Hospital Course (Addendum)
 58 year old woman PMH including diabetes mellitus type 2, chronic pancreatitis, fibromyalgia, depression, chronic back pain, hyperemesis secondary to [facility], presented with intractable nausea and vomiting as well as abdominal pain.  Admitted for same.  Early on developed COPD exacerbation.  Consultants None   Procedures/Events None   30 abdominal/abdominal and pelvis CTs on record, numerous other CTs.  Last seen by gastroenterology June 2023, see that note for details.  She was referred to that office for chronic abdominal pain. At that time PMH including cannabinoid hyperemesis syndrome.  Gastric emptying study was normal in 2020.  "At the time of her last visit with Dr. Luanna Salk in 02/2021, the impression was as follows: [name]  Most likely, I believe the patient has some combination of irritable bowel syndrome as well as possible sequela from her multiple abdominal surgeries. Partial intermittent small bowel obstruction is on the differential although is not showing up when she goes into the emergency room for evaluation.

## 2023-10-08 NOTE — Plan of Care (Signed)
   Problem: Coping: Goal: Level of anxiety will decrease Outcome: Progressing

## 2023-10-08 NOTE — Progress Notes (Signed)
 Pt awake from nap, complains of nausea and severe pain. Describes pain as epigastric and burning. Instructed pt to sit upright in recliner and po nausea med given. NSL nonfunctional and removed. Noted pt again has ordered 2 lunch trays but states that the nausea med is not working. Has currently eaten meatloaf w mashed potatoes, gravy, pinto beans and juice. Pt now working on a Pharmacist, hospital  salad. MD notified of above and pt has refused Mylanta. Pt tells this nurse that she is not gonna take that "Oxy crap." States that only the IV Dilaudid will help her pain and insists on speaking w her MD about her meds. MD notified of pt's request.

## 2023-10-09 DIAGNOSIS — R112 Nausea with vomiting, unspecified: Secondary | ICD-10-CM | POA: Diagnosis not present

## 2023-10-09 DIAGNOSIS — E669 Obesity, unspecified: Secondary | ICD-10-CM | POA: Diagnosis not present

## 2023-10-09 DIAGNOSIS — E1169 Type 2 diabetes mellitus with other specified complication: Secondary | ICD-10-CM | POA: Diagnosis not present

## 2023-10-09 LAB — GLUCOSE, CAPILLARY: Glucose-Capillary: 251 mg/dL — ABNORMAL HIGH (ref 70–99)

## 2023-10-09 MED ORDER — METOPROLOL SUCCINATE ER 50 MG PO TB24
50.0000 mg | ORAL_TABLET | Freq: Every day | ORAL | Status: DC
  Administered 2023-10-09: 50 mg via ORAL
  Filled 2023-10-09: qty 1

## 2023-10-09 MED ORDER — AMLODIPINE-OLMESARTAN 5-40 MG PO TABS: 1.0000 | ORAL_TABLET | Freq: Every day | ORAL | Status: DC

## 2023-10-09 MED ORDER — IRBESARTAN 150 MG PO TABS
300.0000 mg | ORAL_TABLET | Freq: Every day | ORAL | Status: DC
  Administered 2023-10-09: 300 mg via ORAL
  Filled 2023-10-09: qty 2

## 2023-10-09 MED ORDER — AMLODIPINE BESYLATE 5 MG PO TABS
5.0000 mg | ORAL_TABLET | Freq: Every day | ORAL | Status: DC
  Administered 2023-10-09: 5 mg via ORAL
  Filled 2023-10-09: qty 1

## 2023-10-09 NOTE — Discharge Summary (Addendum)
 Physician Discharge Summary   [name] MRN: [id number] DOB: 04-10-1966  Admit date:     10/05/2023  Discharge date: 10/09/23  Discharge Physician: Brendia Sacks   PCP: Fleet Contras, MD   Recommendations at discharge:   Ongoing care for chronic abdominal pain  Discharge Diagnoses: Principal Problem:   Nausea and vomiting Active Problems:   Chronic back pain   Essential hypertension   Type 2 diabetes mellitus with obesity (HCC)   Class 2 obesity due to excess calories with body mass index (BMI) of 36.0 to 36.9 in adult   Tobacco dependence  Resolved Problems:   * No resolved hospital problems. *  Hospital Course: 58 year old woman PMH including diabetes mellitus type 2, chronic pancreatitis, fibromyalgia, depression, chronic back pain, hyperemesis secondary to [facility], presented with intractable nausea and vomiting as well as abdominal pain.  Admitted for same.  Early on developed COPD exacerbation.  Treated with standard therapy with gradual clinical improvement.  Discharged home in good condition.  Further past history 30 abdominal/abdominal and pelvis CTs on record, numerous other CTs. Last seen by gastroenterology June 2023, see that note for details.  She was referred to that office for chronic abdominal pain. At that time PMH including cannabinoid hyperemesis syndrome.  Gastric emptying study was normal in 2020.  "At the time of her last visit with Dr. Luanna Salk in 02/2021, the impression was as follows: [name] Most likely, I believe the patient has some combination of irritable bowel syndrome as well as possible sequela from her multiple abdominal surgeries. Partial intermittent small bowel obstruction is  on the differential although is not showing up when she goes into the emergency room for evaluation.   Consultants None   Procedures/Events None   Hospital course  Intractable nausea with vomiting:  Resolved, eating 2 trays per meal yesterday Etiology unclear, she has been seen by GI in the past for chronic abdominal pain and intractable nausea and vomiting.  Probably related to hyperemesis from cannabinoid use which is known. Stable for discharge today.   COPD exacerbation Resolved.  Weaned off oxygen.  I do not think she needs any further prednisone.   Uncontrolled diabetes, insulin-dependent.:  Hemoglobin A1c 9 Continue outpatient regimen   Essential hypertension: Resume antihypertensives  Tobacco dependence:  -Nicotine patch today.   Chronic pancreatitis:  - CT shows no evidence of pancreatitis   Morbid obesity  Disposition: Home Diet recommendation:  Regular diet DISCHARGE MEDICATION: Allergies as of 10/09/2023   No Known Allergies      Medication List     STOP taking these medications    diclofenac 50 MG EC tablet Commonly known as: VOLTAREN   etodolac 400 MG tablet Commonly known as: Lodine   gabapentin 100 MG capsule Commonly known as: Neurontin   Xtampza ER 18 MG C12a Generic drug: oxyCODONE ER       TAKE these medications    albuterol (2.5 MG/3ML) 0.083% nebulizer solution Commonly known as: PROVENTIL Take 3 mLs (2.5 mg total) by nebulization every 6 (six) hours as needed for wheezing or shortness of breath.   albuterol 108 (90 Base) MCG/ACT inhaler Commonly known as: VENTOLIN HFA Inhale 2 puffs into the lungs every 4 (four) hours as needed for wheezing.  ALPRAZolam 1 MG tablet Commonly known as: XANAX Take 1 mg by mouth daily as needed for anxiety.   amLODipine-olmesartan 5-40 MG tablet Commonly known as: AZOR Take 1 tablet by mouth daily.   aspirin EC 81 MG tablet Take 81 mg by mouth daily. Swallow whole.   cyclobenzaprine  10 MG tablet Commonly known as: FLEXERIL Take 1 tablet (10 mg total) by mouth 2 (two) times daily as needed for muscle spasms. What changed: when to take this   escitalopram 20 MG tablet Commonly known as: LEXAPRO Take 20 mg by mouth daily.   fluticasone 50 MCG/ACT nasal spray Commonly known as: FLONASE Place 2 sprays into both nostrils daily as needed for allergies or rhinitis.   HumaLOG Mix 75/25 KwikPen (75-25) 100 UNIT/ML KwikPen Generic drug: Insulin Lispro Prot & Lispro Inject 80 units in the morning and 70 units in the evening   Insulin Pen Needle 32G X 4 MM Misc 1 each by Does not apply route 3 (three) times daily.   lidocaine 5 % Commonly known as: Lidoderm Place 1 patch onto the skin daily. Remove & Discard patch within 12 hours or as directed by MD What changed:  when to take this reasons to take this   metoprolol succinate 50 MG 24 hr tablet Commonly known as: TOPROL-XL Take 50 mg by mouth daily.   multivitamin with minerals tablet Take 1 tablet by mouth daily.   oxyCODONE 5 MG immediate release tablet Commonly known as: Oxy IR/ROXICODONE Take 5 mg by mouth as needed for moderate pain (pain score 4-6) or severe pain (pain score 7-10).   pantoprazole 40 MG tablet Commonly known as: PROTONIX Take 40 mg by mouth daily.   promethazine-dextromethorphan 6.25-15 MG/5ML syrup Commonly known as: PROMETHAZINE-DM Take 10 mLs by mouth 2 (two) times daily as needed for cough.   simvastatin 20 MG tablet Commonly known as: ZOCOR Take 20 mg by mouth daily.        Follow-up Information     Fleet Contras, MD. Schedule an appointment as soon as possible for a visit in 1 week(s).   Specialty: Internal Medicine Contact information: [address] [place] [address] [phone]                Reports feeling poorly today, did not eat breakfast but ate a good dinner last night.  Discharge Exam: Filed Weights   10/05/23 1303 10/05/23 1312   Weight: 104.3 kg 104.3 kg   Physical Exam Vitals reviewed.  Constitutional:      General: She is not in acute distress.    Appearance: She is not ill-appearing or toxic-appearing.  Cardiovascular:     Rate and Rhythm: Normal rate and regular rhythm.     Heart sounds: No murmur heard. Pulmonary:     Effort: Pulmonary effort is normal. No respiratory distress.     Breath sounds: No wheezing, rhonchi or rales.  Abdominal:     General: There is no distension.     Palpations: Abdomen is soft.     Tenderness: There is no abdominal tenderness.  Neurological:     Mental Status: She is alert.  Psychiatric:     Comments: Keeps eyes closed, mumbles, engages poorly today      Condition at discharge: good  The results of significant diagnostics from this hospitalization (including imaging, microbiology, ancillary and laboratory) are listed below for reference.   Imaging Studies: CT ABDOMEN PELVIS W CONTRAST Result Date: 10/05/2023 CLINICAL DATA:  Abdominal pain.  Acute  nonlocalized abdominal pain. EXAM: CT ABDOMEN AND PELVIS WITH CONTRAST TECHNIQUE: Multidetector CT imaging of the abdomen and pelvis was performed using the standard protocol following bolus administration of intravenous contrast. RADIATION DOSE REDUCTION: This exam was performed according to the departmental dose-optimization program which includes automated exposure control, adjustment of the mA and/or kV according to patient size and/or use of iterative reconstruction technique. CONTRAST:  OMNIPAQUE IOHEXOL 300 MG/ML  SOLN COMPARISON:  CT abdomen 05/15/2023 FINDINGS: Lower chest: Lung bases are clear. Hepatobiliary: Subtle hypodensity in the subcapsular RIGHT hepatic lobe is not changed from prior (image 11/series 2). No biliary duct dilatation. Postcholecystectomy. Pancreas: Pancreas is normal. No ductal dilatation. No pancreatic inflammation. Spleen: Normal spleen Adrenals/urinary tract: Nodule enlargement of the medial  and lateral limb of the LEFT adrenal gland not changed from comparison exam. More remote scan from 11/21/2019 also demonstrates nodule enlargement unchanged from prior. Favor benign nodularity. RIGHT adrenal gland normal. Kidneys, ureters and bladder normal. Stomach/Bowel: Stomach, small bowel, appendix, and cecum are normal. The colon and rectosigmoid colon are normal. Vascular/Lymphatic: Abdominal aorta is normal caliber. No periportal or retroperitoneal adenopathy. No pelvic adenopathy. Reproductive: Post hysterectomy.  Adnexa unremarkable Other: No free fluid. Musculoskeletal: Lower lumbar fusion. IMPRESSION: 1. No acute findings in the abdomen pelvis. 2. Normal appendix. 3. Postcholecystectomy. 4. Stable nodular enlargement of the LEFT adrenal gland. Favor benign nodularity. Electronically Signed   By: Genevive Bi M.D.   On: 10/05/2023 16:59   CT Angio Chest PE W and/or Wo Contrast Result Date: 09/16/2023 CLINICAL DATA:  High probability pulmonary embolism EXAM: CT ANGIOGRAPHY CHEST WITH CONTRAST TECHNIQUE: Multidetector CT imaging of the chest was performed using the standard protocol during bolus administration of intravenous contrast. Multiplanar CT image reconstructions and MIPs were obtained to evaluate the vascular anatomy. RADIATION DOSE REDUCTION: This exam was performed according to the departmental dose-optimization program which includes automated exposure control, adjustment of the mA and/or kV according to patient size and/or use of iterative reconstruction technique. CONTRAST:  65mL OMNIPAQUE IOHEXOL 350 MG/ML SOLN COMPARISON:  10/19/2020 FINDINGS: Cardiovascular: Adequate opacification of the pulmonary arterial tree. No intraluminal filling defect identified to suggest acute pulmonary embolism. Central pulmonary arteries are of normal caliber. No significant coronary artery calcification. Cardiac size within normal limits. No pericardial effusion. No significant atherosclerotic  calcification within the thoracic aorta. No aortic aneurysm. Mediastinum/Nodes: No enlarged mediastinal, hilar, or axillary lymph nodes. Thyroid gland, trachea, and esophagus demonstrate no significant findings. Lungs/Pleura: Lungs are clear. No pleural effusion or pneumothorax. Upper Abdomen: No acute abnormality. Musculoskeletal: No chest wall abnormality. No acute or significant osseous findings. Review of the MIP images confirms the above findings. IMPRESSION: 1. No pulmonary embolism. No acute intrathoracic pathology identified. Electronically Signed   By: Helyn Numbers M.D.   On: 09/16/2023 21:00   DG Shoulder Left Result Date: 09/16/2023 CLINICAL DATA:  pain EXAM: LEFT SHOULDER - 2+ VIEW COMPARISON:  09/02/2017 FINDINGS: There is no evidence of fracture or dislocation. Small acromioclavicular spurs. There is no evidence of arthropathy or other focal bone abnormality. Soft tissues are unremarkable. IMPRESSION: 1. No acute findings. 2. Small acromioclavicular spurs. Electronically Signed   By: Corlis Leak M.D.   On: 09/16/2023 14:14   DG Elbow Complete Left Result Date: 09/16/2023 CLINICAL DATA:  Pain and swelling x3 days EXAM: LEFT ELBOW - COMPLETE 3+ VIEW COMPARISON:  None Available. FINDINGS: There is no evidence of fracture, dislocation, or joint effusion. There is no evidence of arthropathy or other focal bone  abnormality. Soft tissues are unremarkable. IMPRESSION: Negative. Electronically Signed   By: Corlis Leak M.D.   On: 09/16/2023 14:13    Microbiology: Results for orders placed or performed during the hospital encounter of 10/05/23  Resp panel by RT-PCR (RSV, Flu A&B, Covid) Anterior Nasal Swab     Status: None   Collection Time: 10/06/23  2:52 AM   Specimen: Anterior Nasal Swab  Result Value Ref Range Status   SARS Coronavirus 2 by RT PCR NEGATIVE NEGATIVE Final    Comment: (NOTE) SARS-CoV-2 target nucleic acids are NOT DETECTED.  The SARS-CoV-2 RNA is generally detectable in upper  respiratory specimens during the acute phase of infection. The lowest concentration of SARS-CoV-2 viral copies this assay can detect is 138 copies/mL. A negative result does not preclude SARS-Cov-2 infection and should not be used as the sole basis for treatment or other patient management decisions. A negative result may occur with  improper specimen collection/handling, submission of specimen other than nasopharyngeal swab, presence of viral mutation(s) within the areas targeted by this assay, and inadequate number of viral copies(<138 copies/mL). A negative result must be combined with clinical observations, patient history, and epidemiological information. The expected result is Negative.  Fact Sheet for Patients:  BloggerCourse.com  Fact Sheet for Healthcare Providers:  SeriousBroker.it  This test is no t yet approved or cleared by the [place] FDA and  has been authorized for detection and/or diagnosis of SARS-CoV-2 by FDA under an Emergency Use Authorization (EUA). This EUA will remain  in effect (meaning this test can be used) for the duration of the COVID-19 declaration under Section 564(b)(1) of the Act, 21 U.S.C.section 360bbb-3(b)(1), unless the authorization is terminated  or revoked sooner.       Influenza A by PCR NEGATIVE NEGATIVE Final   Influenza B by PCR NEGATIVE NEGATIVE Final    Comment: (NOTE) The Xpert Xpress SARS-CoV-2/FLU/RSV plus assay is intended as an aid in the diagnosis of influenza from Nasopharyngeal swab specimens and should not be used as a sole basis for treatment. Nasal washings and aspirates are unacceptable for Xpert Xpress SARS-CoV-2/FLU/RSV testing.  Fact Sheet for Patients: BloggerCourse.com  Fact Sheet for Healthcare Providers: SeriousBroker.it  This test is not yet approved or cleared by the [place] FDA and has been  authorized for detection and/or diagnosis of SARS-CoV-2 by FDA under an Emergency Use Authorization (EUA). This EUA will remain in effect (meaning this test can be used) for the duration of the COVID-19 declaration under Section 564(b)(1) of the Act, 21 U.S.C. section 360bbb-3(b)(1), unless the authorization is terminated or revoked.     Resp Syncytial Virus by PCR NEGATIVE NEGATIVE Final    Comment: (NOTE) Fact Sheet for Patients: BloggerCourse.com  Fact Sheet for Healthcare Providers: SeriousBroker.it  This test is not yet approved or cleared by the [place] FDA and has been authorized for detection and/or diagnosis of SARS-CoV-2 by FDA under an Emergency Use Authorization (EUA). This EUA will remain in effect (meaning this test can be used) for the duration of the COVID-19 declaration under Section 564(b)(1) of the Act, 21 U.S.C. section 360bbb-3(b)(1), unless the authorization is terminated or revoked.  Performed at [facility], [address], [place], [place] [address]     Labs: CBC: Recent Labs  Lab 10/05/23 1335 10/06/23 0449 10/07/23 0440 10/08/23 0503  WBC 13.2* 14.0* 11.1* 14.6*  NEUTROABS 9.7*  --   --   --   HGB 15.4* 12.7 13.4 12.8  HCT 47.1* 39.1 41.7 39.2  MCV 84.3 85.2 87.2 85.8  PLT 433* 364 340 346   Basic Metabolic Panel: Recent Labs  Lab 10/05/23 1335 10/06/23 0449 10/07/23 0440 10/08/23 0503  NA 139 136 139 136  K 3.9 4.0 3.9 3.8  CL 103 101 101 98  CO2 22 25 26 28   GLUCOSE 247* 242* 166* 206*  BUN 15 16 12 15   CREATININE 0.72 0.63 0.52 0.58  CALCIUM 9.6 8.7* 8.9 8.6*   Liver Function Tests: Recent Labs  Lab 10/05/23 1335 10/06/23 0449  AST 27 20  ALT 26 21  ALKPHOS 124 97  BILITOT 0.9 0.7  PROT 8.3* 7.2  ALBUMIN 4.4 3.8   CBG: Recent Labs  Lab 10/08/23 0746 10/08/23 1137 10/08/23 1640 10/08/23 2120 10/09/23 0714  GLUCAP 241* 374* 351* 345*  251*    Discharge time spent: less than 30 minutes.  Signed: Brendia Sacks, MD [facility] 10/09/2023

## 2023-10-09 NOTE — Progress Notes (Signed)
 MD aware of patient's most recent blood pressure of 180/109, patient refusing to allow RN to recheck blood pressure and blood glucose at this time, MD aware of situation, patient stated while MD was present in room with RN that if she was discharged she will readmit herself through the ED, RN provided and attempted to explain discharge paperwork, patient refused and stated that RN "can excuse herself."

## 2023-10-21 ENCOUNTER — Ambulatory Visit
Admission: RE | Admit: 2023-10-21 | Discharge: 2023-10-21 | Disposition: A | Discharge status: Home / Self Care | Source: Ambulatory Visit | Attending: Internal Medicine | Admitting: Internal Medicine

## 2023-10-21 DIAGNOSIS — Z1231 Encounter for screening mammogram for malignant neoplasm of breast: Secondary | ICD-10-CM

## 2023-10-26 ENCOUNTER — Other Ambulatory Visit: Payer: Self-pay | Admitting: Orthopedic Surgery

## 2023-11-13 ENCOUNTER — Ambulatory Visit (INDEPENDENT_AMBULATORY_CARE_PROVIDER_SITE_OTHER): Admitting: Podiatry

## 2023-11-13 ENCOUNTER — Encounter: Payer: Self-pay | Admitting: Podiatry

## 2023-11-13 DIAGNOSIS — E119 Type 2 diabetes mellitus without complications: Secondary | ICD-10-CM

## 2023-11-13 DIAGNOSIS — M79675 Pain in left toe(s): Secondary | ICD-10-CM | POA: Diagnosis not present

## 2023-11-13 DIAGNOSIS — B351 Tinea unguium: Secondary | ICD-10-CM | POA: Diagnosis not present

## 2023-11-13 DIAGNOSIS — M79674 Pain in right toe(s): Secondary | ICD-10-CM | POA: Diagnosis not present

## 2023-11-13 MED ORDER — AMMONIUM LACTATE 12 % EX CREA: 1.0000 | TOPICAL_CREAM | CUTANEOUS | 2 refills | Status: AC | PRN

## 2023-11-13 MED ORDER — CICLOPIROX 8 % EX SOLN: Freq: Every day | CUTANEOUS | 2 refills | Status: AC

## 2023-11-13 NOTE — Patient Instructions (Signed)

## 2023-11-13 NOTE — Progress Notes (Signed)
  Subjective:  Patient ID: [name], female    DOB: 11-08-1965,  MRN: [id number]  Chief Complaint  Patient presents with   [facility]    RM#12 [facility] patient has concerns of nail fungus.    Discussed the use of AI scribe software for clinical note transcription with the patient, who gave verbal consent to proceed.  History of Present Illness The patient, a diabetic, presents with sharp pains in her feet, which she attributes to her degenerative disc disease and sciatic issues. She reports that these pains occur frequently. She is currently on gabapentin for these issues. Her last A1c, taken a month ago, was high at 9, but this was an improvement from the previous year. The patient also has some loss of sensation in her feet, which is likely due to a combination of neuropathy from her diabetes and her back issues. She has been told she has neuropathy in the past. The patient's toenails appear to have a fungal infection.  Continue taking she has difficulty trimming them.  She use to get pedicures.      Objective:    Physical Exam General: AAO x3, NAD  Dermatological: There is a very hypertrophic, dystrophic yellowed, brown discoloration with subungual debris present.  There is no edema, erythema.  No open lesions.  Tenderness nails 1-5 bilaterally.  Vascular: Dorsalis Pedis artery and Posterior Tibial artery pedal pulses are 2/4 bilateral with immedate capillary fill time. There is no pain with calf compression, swelling, warmth, erythema.   Neruologic: Grossly intact via light touch bilateral.   Musculoskeletal: Not able to appreciate any area pinpoint tenderness.  Flexor, extensor tendons intact. MMT 5/5.   Gait: Unassisted, Nonantalgic.     No images are attached to the encounter.    Results  LABS A1c: 9 (10/06/2023)   Assessment:   1. Encounter for diabetic foot exam (HCC)   2. Dermatophytosis of nail   3. Pain in toes of both feet      Plan:  Patient was  evaluated and treated and all questions answered.  Assessment and Plan Assessment & Plan Diabetic Neuropathy Sharp foot pain likely due to diabetic neuropathy and sciatic issues. A1c was 9, indicating poor glycemic control, exacerbating neuropathy. Neuropathy is progressive particularly with poor glycemic control. - Monitor blood glucose levels closely. - Adjust gabapentin dosage if necessary; send current dosage information via MyChart.  She is not aware what she is currently taking. - Educated on daily foot inspection. - Schedule foot exams every six months.  Symptomatic onychomycosis Toenails show signs of fungal infection, likely onychomycosis, causing nail thickening and shape changes. - Prescribe ciclopirox (Penlac) topical solution once daily to affected toenails. -Sharply debrided nails x 10 without any complications or bleeding. - Educated that treatment may take several months to a year.  Foot Care and Skin Management Proper skin care is essential to prevent cracks and dryness on feet, reducing complications in diabetic patients. - Prescribe Amlactin moisturizer for foot care, advising not to apply between toes.  Follow-up Regular follow-up is necessary to monitor foot health and adjust treatment as needed. - Schedule nail trimming every two to three months, as covered by insurance. - Instruct to contact the office if any foot issues arise before the next scheduled visit.   Return in about 3 months (around 02/12/2024).   Vivi Barrack DPM

## 2023-11-18 ENCOUNTER — Ambulatory Visit
Admission: RE | Admit: 2023-11-18 | Discharge: 2023-11-18 | Disposition: A | Discharge status: Home / Self Care | Source: Ambulatory Visit | Attending: Family Medicine | Admitting: Family Medicine

## 2023-11-18 ENCOUNTER — Other Ambulatory Visit: Payer: Self-pay

## 2023-11-18 ENCOUNTER — Ambulatory Visit (INDEPENDENT_AMBULATORY_CARE_PROVIDER_SITE_OTHER)

## 2023-11-18 VITALS — BP 145/87 | HR 102 | Temp 98.0°F | Resp 18

## 2023-11-18 DIAGNOSIS — M25561 Pain in right knee: Secondary | ICD-10-CM | POA: Diagnosis not present

## 2023-11-18 MED ORDER — IBUPROFEN 800 MG PO TABS: 800.0000 mg | ORAL_TABLET | Freq: Three times a day (TID) | ORAL | 0 refills | Status: AC | PRN
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
## 2023-11-18 NOTE — Discharge Instructions (Addendum)
 Your xray of the knee did not show any broken bones.  Take ibuprofen 800 mg--1 tab every 8 hours as needed for pain.  Followup with your primary care.

## 2023-11-18 NOTE — ED Provider Notes (Addendum)
 [facility]    CSN: [id number] Arrival date & time: 11/18/23  4098      History   Chief Complaint Chief Complaint  Patient presents with   Knee Injury    Entered by patient    HPI [name] is a 58 y.o. female.   HPI Here for right knee pain. About 3 weeks ago she was lifting her right leg up to get in her car and bumped it hard on a part of the car. Since it has been hurting, popping, and feeling like it is giving way.  NKDA.  Does not take diclofenac. Has been taking tylenol. Past Medical History:  Diagnosis Date   Anxiety    Arthritis    rheumatoid , osteoarthritis   Bronchitis    Chronic back pain    Depression    Diabetes mellitus    Fatty liver    Fibromyalgia    GERD (gastroesophageal reflux disease)    Headache    Hypertension    Pancreatitis    Pneumonia    Rotator cuff tear, right     Patient Active Problem List   Diagnosis Date Noted   Nausea and vomiting 10/05/2023   Severe obesity (BMI 35.0-35.9 with comorbidity) (HCC) 06/16/2023   GERD (gastroesophageal reflux disease) 08/14/2022   HLD (hyperlipidemia) 08/14/2022   Pain of upper abdomen 06/02/2022   Adrenal adenoma, left 09/13/2021   Intractable nausea and vomiting 09/12/2021   Anxiety 09/12/2021   Gastroenteritis 09/09/2021   Type 2 diabetes mellitus with obesity (HCC) 09/09/2021   Class 2 obesity due to excess calories with body mass index (BMI) of 36.0 to 36.9 in adult 09/09/2021   Marijuana abuse 09/09/2021   Tobacco dependence 09/09/2021   Essential hypertension 05/29/2020   Abdominal pain 11/23/2019   Acute pancreatitis 11/21/2019   De Quervain's syndrome (tenosynovitis) 07/06/2018   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   Pain 07/06/2018   Complete tear of right rotator cuff 07/22/2017    Class: Chronic   Retained orthopedic hardware 07/22/2017    Class: Chronic   Cervical disc herniation 07/21/2017    Class: Acute   Status post cervical spinal fusion 07/21/17  07/21/2017   Chronic back pain     Past Surgical History:  Procedure Laterality Date   ABDOMINAL HYSTERECTOMY     ANTERIOR CERVICAL DECOMP/DISCECTOMY FUSION N/A 07/21/2017   Procedure: ANTERIOR CERVICAL DISCECTOMY AND FUSION C6-7, REMOVAL OF SCREW C6 PLATE, DEPUY ZERO-P IMPLANT, LOCAL BONE GRAFT, ALLOGRAFT BONE GRAFT, VIVIGEN;  Surgeon: Kerrin Champagne, MD;  Location: [facility] OR;  Service: Orthopedics;  Laterality: N/A;   BACK SURGERY     laminectomy   BONE GRAFT HIP ILIAC CREST     BREAST BIOPSY     CARPAL TUNNEL RELEASE     CERVICAL DISCECTOMY  07/21/2017   CESAREAN SECTION     CHOLECYSTECTOMY     COLONOSCOPY     frontal fusion     neck fusion     patient reported   SHOULDER ARTHROSCOPY WITH SUBACROMIAL DECOMPRESSION, ROTATOR CUFF REPAIR AND BICEP TENDON REPAIR Right 10/31/2017   Procedure: RIGHT SHOULDER ARTHROSCOPY, MANIPULATION UNDER ANESTHESIA, BICEPS TENODESIS, AND MINI OPEN ROTATOR CUFF TEAR REPAIR;  Surgeon: Cammy Copa, MD;  Location: [facility] OR;  Service: Orthopedics;  Laterality: Right;    OB History   No obstetric history on file.      Home Medications    Prior to Admission medications   Medication Sig Start Date End Date Taking? Authorizing Provider  ibuprofen (ADVIL) 800 MG tablet Take 1 tablet (800 mg total) by mouth every 8 (eight) hours as needed (pain). 11/18/23  Yes Zenia Resides, MD  albuterol (PROVENTIL) (2.5 MG/3ML) 0.083% nebulizer solution Take 3 mLs (2.5 mg total) by nebulization every 6 (six) hours as needed for wheezing or shortness of breath. 12/12/19   Cathie Hoops, Amy V, PA-C  albuterol (VENTOLIN HFA) 108 (90 Base) MCG/ACT inhaler Inhale 2 puffs into the lungs every 4 (four) hours as needed for wheezing. 06/07/22   Tomi Bamberger, PA-C  ALPRAZolam Prudy Feeler) 1 MG tablet Take 1 mg by mouth daily as needed for anxiety. 10/23/19   [provider]  amLODipine-olmesartan (AZOR) 5-40 MG tablet Take 1 tablet by mouth daily. 06/05/21   [provider]   ammonium lactate (AMLACTIN) 12 % cream Apply 1 Application topically as needed for dry skin. 11/13/23   Vivi Barrack, DPM  aspirin EC 81 MG tablet Take 81 mg by mouth daily. Swallow whole.    [provider]  ciclopirox (PENLAC) 8 % solution Apply topically at bedtime. Apply over nail and surrounding skin. Apply daily over previous coat. After seven (7) days, may remove with alcohol and continue cycle. 11/13/23   Vivi Barrack, DPM  cyclobenzaprine (FLEXERIL) 10 MG tablet Take 1 tablet (10 mg total) by mouth 2 (two) times daily as needed for muscle spasms. Patient taking differently: Take 10 mg by mouth daily as needed for muscle spasms. 06/02/23   Tomi Bamberger, PA-C  escitalopram (LEXAPRO) 20 MG tablet Take 20 mg by mouth daily. Patient not taking: Reported on 11/13/2023 08/10/20   [provider]  fluticasone (FLONASE) 50 MCG/ACT nasal spray Place 2 sprays into both nostrils daily as needed for allergies or rhinitis.    [provider]  Insulin Lispro Prot & Lispro (HUMALOG MIX 75/25 KWIKPEN) (75-25) 100 UNIT/ML Kwikpen Inject 80 units in the morning and 70 units in the evening 08/09/23     Insulin Pen Needle 32G X 4 MM MISC 1 each by Does not apply route 3 (three) times daily. 09/08/22   Burnadette Pop, MD  lidocaine (LIDODERM) 5 % Place 1 patch onto the skin daily. Remove & Discard patch within 12 hours or as directed by MD Patient taking differently: Place 1 patch onto the skin daily as needed (pain). Remove & Discard patch within 12 hours or as directed by MD 06/09/23   Netta Corrigan, PA-C  metoprolol succinate (TOPROL-XL) 50 MG 24 hr tablet Take 50 mg by mouth daily. 07/21/14   [provider]  Multiple Vitamins-Minerals (MULTIVITAMIN WITH MINERALS) tablet Take 1 tablet by mouth daily.    [provider]  oxyCODONE (OXY IR/ROXICODONE) 5 MG immediate release tablet Take 5 mg by mouth as needed for moderate pain (pain score 4-6) or severe  pain (pain score 7-10). 09/22/23   [provider]  pantoprazole (PROTONIX) 40 MG tablet Take 40 mg by mouth daily. 01/15/22   [provider]  simvastatin (ZOCOR) 20 MG tablet Take 20 mg by mouth daily. 08/22/20   [provider]    Family History Family History  Problem Relation Age of Onset   Hypertension Father    Renal Disease Father    Diabetes Mother    Hypertension Mother    Edema Mother    Diabetes Sister    Diabetes Brother     Social History Social History   Tobacco Use   Smoking status: Every Day  Current packs/day: 0.50    Average packs/day: 0.5 packs/day for 40.0 years (20.0 ttl pk-yrs)    Types: Cigarettes   Smokeless tobacco: Never  Vaping Use   Vaping status: Never Used  Substance Use Topics   Alcohol use: No   Drug use: Yes    Types: Marijuana    Comment: 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   Review of Systems Review of Systems   Physical Exam Triage Vital Signs ED Triage Vitals [11/18/23 0951]  Encounter Vitals Group     BP (!) 145/87     Systolic BP Percentile      Diastolic BP Percentile      Pulse Rate (!) 102     Resp 18     Temp 98 F (36.7 C)     Temp Source Oral     SpO2 98 %     Weight      Height      Head Circumference      Peak Flow      Pain Score 7     Pain Loc      Pain Education      Exclude from Growth Chart    No data found.  Updated Vital Signs BP (!) 145/87 (BP Location: Left Arm)   Pulse (!) 102   Temp 98 F (36.7 C) (Oral)   Resp 18   SpO2 98%   Visual Acuity Right Eye Distance:   Left Eye Distance:   Bilateral Distance:    Right Eye Near:   Left Eye Near:    Bilateral Near:     Physical Exam Vitals reviewed.  Constitutional:      General: She is not in acute distress.    Appearance: She is not toxic-appearing.  Musculoskeletal:     Comments: There is some  tenderness of the right knee joint line. No effusion at this point. No edema of lower leg.  Skin:    Coloration: Skin is not pale.  Neurological:     General: No focal deficit present.     Mental Status: She is alert and oriented to person, place, and time.  Psychiatric:        Behavior: Behavior normal.      UC Treatments / Results  Labs (all labs ordered are listed, but only abnormal results are displayed) Labs Reviewed - No data to display  EKG   Radiology DG Knee Complete 4 Views Right Result Date: 11/18/2023 CLINICAL DATA:  Knee injury 3 weeks ago.  Increasing pain, weakness EXAM: RIGHT KNEE - COMPLETE 4+ VIEW COMPARISON:  None Available. FINDINGS: No evidence of fracture, dislocation, or joint effusion. No evidence of arthropathy or other focal bone abnormality. Soft tissues are unremarkable. IMPRESSION: No acute bony abnormality. Electronically Signed   By: Charlett Nose M.D.   On: 11/18/2023 10:12    Procedures Procedures (including critical care time)  Medications Ordered in UC Medications - No data to display  Initial Impression / Assessment and Plan / UC Course  I have reviewed the triage vital signs and the nursing notes.  Pertinent labs & imaging results that were available during my care of the patient were reviewed by me and considered in my medical decision making (see chart for details).     Xray is negative for bony abnormality. Knee sleeve brace supplied, and ibuprofen 800 mg sent  in for short term pain relief. She will followup with Pcp, and contact info for ortho given.  This facility was out of knee braces, so ACE wrap applied at dc instead. Final Clinical Impressions(s) / UC Diagnoses   Final diagnoses:  Acute pain of right knee     Discharge Instructions      Your xray of the knee did not show any broken bones.  Take ibuprofen 800 mg--1 tab every 8 hours as needed for pain.  Followup with your primary care.     ED Prescriptions      Medication Sig Dispense Auth. Provider   ibuprofen (ADVIL) 800 MG tablet Take 1 tablet (800 mg total) by mouth every 8 (eight) hours as needed (pain). 21 tablet Maressa Apollo, Janace Aris, MD      I have reviewed the PDMP during this encounter.   Zenia Resides, MD 11/18/23 1028    Zenia Resides, MD 11/18/23 [phone]

## 2024-01-14 NOTE — Progress Notes (Signed)
 Chief Complaint: Chief Complaint  Patient presents with  . Follow-up    Type 2 Diabetes   Patient gives verbal permission to use DAX  HPI: Ms.  [name], 58 y.o. female, coming in for follow-up of type 2 diabetes.  Past medical history includes type 2 diabetes, chronic pancreatitis, fibromyalgia, depression, chronic back pain. History of Present Illness  Initial visit with me June 16, 2023.  At this time she was on basal insulin  and mixed insulin .  Plan was to stop basal insulin  and adjust to mix insulin  dose.  Given history of pancreatitis did not recommend use of GLP-1.  She was referred to diabetes education at this time.  Today she reports overall doing well. Reports her A1c 9% last week at her primary care provider's office. Our nurses have called to request a copy of recent A1c.  No longer using libre CGM due to adhesive issues and pain. States she has not been checking fingerstick blood glucose.  She is asking for medication to help with weight loss.  States due to degenerative disc disease she is not able to exercise.  Currently taking 75/25 mix insulin  80 units twice daily.  Did not tolerate metformin in the past.  Denies prior use of SGLT2's.  Denies frequent UTIs or yeast infections.  Admits diet is a barrier to diabetes management.  Admits she is currently drinking about 8 ounces of mango juice 3 times daily.  Drinks regular soda or sweet tea.  Has not been monitoring carb intake.  Reports motivation to make lifestyle changes.  DM current regimen and BS control: Current meds-mixed insulin  75/25 insulin  80 units with breakfast and 80 units in the evening Glucometer-libre stopped due to pain and adhesive issues  Frequency of BS monitoring-not monitoring blood glucose BS range-no blood glucose trends for review not checking Adherence to regimen-good H/o Hypoglycemia-denies  H/o hypoglycemic unawareness-denies   Diabetes history: Type- 2 Duration- ~10 years  ago  Previous treatment regimens-metformin (did not tolerate), Lantus , Trulicity  (pancreatitis) Previous control-9% 06/11/23--> 9% 10/06/23--> 9% 12/2023 (per patient at PCP office_ H/o hospitalizations for hyper/hypoglycemia-denies Last CDE/ RD visit- referred  Complications: Kidney-denies history of chronic kidney disease BP- BP Readings from Last 3 Encounters:  01/14/24 116/68  06/16/23 126/79   Eye-denies history of retinopathy Neuropathy- + Cardiac-hypertension Other-  Lifestyle:  Diet: Drinks about 8 ounces mango juice 3 times daily Exercise: Limited due to back pain  Social: 2 boys and 1 girl. 5 grandchildren   Medications: New Medications Ordered This Visit  Medications  . blood-glucose sensor (Dexcom G7 Sensor)    Sig: Use as directed to monitor blood sugar levels. Follow package directions for replacement.    Dispense:  9 each    Refill:  4  . empagliflozin (JARDIANCE) 10 mg tab    Sig: Take 1 tablet (10 mg total) by mouth daily.    Dispense:  30 tablet    Refill:  6      Allergies: No Known Allergies  PMH: Past Medical History:  Diagnosis Date  . Anxiety   . Arthritis   . Depression   . Diabetes mellitus type II, controlled    (CMD)   . Hyperlipidemia   . Hypertension     Family history: Family History  Problem Relation Name Age of Onset  . Diabetes Mother    . Hypertension Mother    . Diabetes Father    . Hypertension Father      Social history: Social History  Socioeconomic History  . Marital status: Single    Spouse name: Not on file  . Number of children: Not on file  . Years of education: Not on file  . Highest education level: Not on file  Occupational History  . Not on file  Tobacco Use  . Smoking status: Every Day  . Smokeless tobacco: Never  Substance and Sexual Activity  . Alcohol use: Not on file  . Drug use: Not on file  . Sexual activity: Not on file  Other Topics Concern  . Not on file  Social History Narrative   . Not on file   Social Drivers of Health   Food Insecurity: Low Risk  (01/14/2024)   Food vital sign   . Within the past 12 months, you worried that your food would run out before you got money to buy more: Never true   . Within the past 12 months, the food you bought just didn't last and you didn't have money to get more: Never true  Transportation Needs: No Transportation Needs (01/14/2024)   Transportation   . In the past 12 months, has lack of reliable transportation kept you from medical appointments, meetings, work or from getting things needed for daily living? : No  Safety: Low Risk  (01/14/2024)   Safety   . How often does anyone, including family and friends, physically hurt you?: Never   . How often does anyone, including family and friends, insult or talk down to you?: Never   . How often does anyone, including family and friends, threaten you with harm?: Never   . How often does anyone, including family and friends, scream or curse at you?: Never  Living Situation: Low Risk  (01/14/2024)   Living Situation   . What is your living situation today?: I have a steady place to live   . Think about the place you live. Do you have problems with any of the following? Choose all that apply:: Not on file    Review of Systems: As above  Physical Exam: Vitals:   01/14/24 0951  BP: 116/68  Pulse: 79  SpO2: 94%   Weights (last 90 days)     Date/Time Weight   01/14/24 0951 109 kg (239 lb 14.4 oz)       Physical Exam  General: Alert and oriented, no acute distress Neck: No visible goiter CV: Heart rate 79 Respiratory: Unlabored breathing pattern Extremities: No edema Foot exam: Full sensation to bilateral feet on monofilament exam.  No wounds or sores noted  Data: No results found for: HGBA1C No results found for: CHOL, TRIG, HDL, LDLCALC No results found for: CREATININE, BUN, NA, K, CL, CO2 No results found for: TSH No results found for: WBC,  HGB, HCT, MCV, PLT No results found for: ALBUR   There is no immunization history on file for this patient.  Assessment/Plan: Uncontrolled type 2 diabetes In summary, 58 y.o. female with  has a past medical history of Anxiety, Arthritis, Depression, Diabetes mellitus type II, controlled    (CMD), Hyperlipidemia, and Hypertension.here for follow-up of Diabetes mellitus  Assessment & Plan  Type 2 diabetes: Patient reports A1c 9% 1 week ago with PCP.  Will request this lab result from primary care provider.  Goal A1c 7% or less with hypoglycemia avoidance.  She stopped using CGM due to adhesive issues and reported pain.  She has not been checking fingerstick blood glucose.  Reviewed with patient the importance of monitoring blood glucose with  either CGM or fingersticks.  She was given a Dexcom G7 sample in office today.  Dexcom G7 app downloaded on her phone.  She is currently taking mixed insulin  75/25 80 units twice daily.  She is asking for medication to help with weight loss.  Given history of chronic pancreatitis would not recommend GLP-1.  She reports last event of pancreatitis was about 7 months ago.  Will try SGLT2 as this may help with glycemic control and weight loss.  Reviewed with patient the importance of increasing water intake when starting SGLT2's. GFR (>60) on 10/06/23, lab in care every where.   Reviewed nutritional recommendations with patient.  Recommend no more than 30 to 45 g of carbs per meal.  Strongly encouraged her to stop drinking juice and sweetened beverages.  Reviewed the importance of monitoring carb intake.  Obesity/BMI 41: Referred to weight management.  Nutritional recommendations reviewed with patient.  Encouraged walking or pool exercises.  DM-related therapies  ASA: Not currently ACE/ARB: olmesartan   Statin: Simvastatin  Dilated eye exam: 08/2023 Foot exam: 01/15/24 Flu Shot: annually  Albuminuria Screening: ordered 01/15/24 HTN: Most recent BP is at  goal <140/90 per ADA guidelines.   Patient Instructions  Continue 75/25 mixed insulin  80 units twice daily   Start Jardiance 10 mg daily. Increase water intake with start of Jardaince   Start Dexcom CGM to monitor BG closely   Monitor carb intake   Walk  Orders Placed This Encounter  Medications  . blood-glucose sensor (Dexcom G7 Sensor)    Sig: Use as directed to monitor blood sugar levels. Follow package directions for replacement.    Dispense:  9 each    Refill:  4  . empagliflozin (JARDIANCE) 10 mg tab    Sig: Take 1 tablet (10 mg total) by mouth daily.    Dispense:  30 tablet    Refill:  6   Orders Placed This Encounter  Procedures  . Albumin, Random Urine  . Ambulatory referral to Weight Management  . [facility] HISTORICAL DIABETES FOOT EXAM     Follow-up: October 2025     Electronically signed by: Warren Gleen Batty, FNP 01/14/2024 11:00 AM

## 2024-01-31 ENCOUNTER — Other Ambulatory Visit: Payer: Self-pay | Admitting: Orthopedic Surgery

## 2024-03-01 ENCOUNTER — Other Ambulatory Visit: Payer: Self-pay | Admitting: Orthopedic Surgery

## 2024-03-02 ENCOUNTER — Emergency Department (HOSPITAL_COMMUNITY)
Admission: EM | Admit: 2024-03-02 | Discharge: 2024-03-02 | Disposition: A | Discharge status: Home / Self Care | Attending: Emergency Medicine | Admitting: Emergency Medicine

## 2024-03-02 ENCOUNTER — Emergency Department (HOSPITAL_COMMUNITY)

## 2024-03-02 ENCOUNTER — Encounter (HOSPITAL_COMMUNITY): Payer: Self-pay | Admitting: Emergency Medicine

## 2024-03-02 ENCOUNTER — Other Ambulatory Visit: Payer: Self-pay

## 2024-03-02 DIAGNOSIS — M5432 Sciatica, left side: Secondary | ICD-10-CM | POA: Diagnosis present

## 2024-03-02 DIAGNOSIS — I10 Essential (primary) hypertension: Secondary | ICD-10-CM | POA: Diagnosis not present

## 2024-03-02 DIAGNOSIS — Z794 Long term (current) use of insulin: Secondary | ICD-10-CM | POA: Insufficient documentation

## 2024-03-02 DIAGNOSIS — Z79899 Other long term (current) drug therapy: Secondary | ICD-10-CM | POA: Insufficient documentation

## 2024-03-02 DIAGNOSIS — E119 Type 2 diabetes mellitus without complications: Secondary | ICD-10-CM | POA: Diagnosis not present

## 2024-03-02 DIAGNOSIS — Z7982 Long term (current) use of aspirin: Secondary | ICD-10-CM | POA: Diagnosis not present

## 2024-03-02 LAB — CBC WITH DIFFERENTIAL/PLATELET
Abs Immature Granulocytes: 0.04 K/uL (ref 0.00–0.07)
Basophils Absolute: 0.1 K/uL (ref 0.0–0.1)
Basophils Relative: 1 %
Eosinophils Absolute: 0.3 K/uL (ref 0.0–0.5)
Eosinophils Relative: 3 %
HCT: 45.3 % (ref 36.0–46.0)
Hemoglobin: 14.8 g/dL (ref 12.0–15.0)
Immature Granulocytes: 0 %
Lymphocytes Relative: 33 %
Lymphs Abs: 3.5 K/uL (ref 0.7–4.0)
MCH: 27.8 pg (ref 26.0–34.0)
MCHC: 32.7 g/dL (ref 30.0–36.0)
MCV: 85 fL (ref 80.0–100.0)
Monocytes Absolute: 0.8 K/uL (ref 0.1–1.0)
Monocytes Relative: 8 %
Neutro Abs: 6 K/uL (ref 1.7–7.7)
Neutrophils Relative %: 55 %
Platelets: 344 K/uL (ref 150–400)
RBC: 5.33 MIL/uL — ABNORMAL HIGH (ref 3.87–5.11)
RDW: 14.1 % (ref 11.5–15.5)
WBC: 10.7 K/uL — ABNORMAL HIGH (ref 4.0–10.5)
nRBC: 0 % (ref 0.0–0.2)

## 2024-03-02 LAB — COMPREHENSIVE METABOLIC PANEL WITH GFR
ALT: 25 U/L (ref 0–44)
AST: 20 U/L (ref 15–41)
Albumin: 3.7 g/dL (ref 3.5–5.0)
Alkaline Phosphatase: 131 U/L — ABNORMAL HIGH (ref 38–126)
Anion gap: 11 (ref 5–15)
BUN: 12 mg/dL (ref 6–20)
CO2: 25 mmol/L (ref 22–32)
Calcium: 8.5 mg/dL — ABNORMAL LOW (ref 8.9–10.3)
Chloride: 100 mmol/L (ref 98–111)
Creatinine, Ser: 0.61 mg/dL (ref 0.44–1.00)
GFR, Estimated: >60 mL/min (ref >60)
Glucose, Bld: 185 mg/dL — ABNORMAL HIGH (ref 70–99)
Potassium: 4.1 mmol/L (ref 3.5–5.1)
Sodium: 136 mmol/L (ref 135–145)
Total Bilirubin: 0.5 mg/dL (ref 0.0–1.2)
Total Protein: 6.7 g/dL (ref 6.5–8.1)

## 2024-03-02 MED ORDER — HYDROMORPHONE HCL 1 MG/ML IJ SOLN
1.0000 mg | Freq: Once | INTRAMUSCULAR | Status: AC
  Administered 2024-03-02: 1 mg via INTRAMUSCULAR
  Filled 2024-03-02: qty 1

## 2024-03-02 MED ORDER — OXYCODONE HCL 5 MG PO TABS: 5.0000 mg | ORAL_TABLET | ORAL | 0 refills | Status: AC | PRN

## 2024-03-02 MED ORDER — METHOCARBAMOL 500 MG PO TABS
1000.0000 mg | ORAL_TABLET | Freq: Once | ORAL | Status: AC
  Administered 2024-03-02: 1000 mg via ORAL
  Filled 2024-03-02: qty 2

## 2024-03-02 MED ORDER — LIDOCAINE 5 % EX PTCH
1.0000 | MEDICATED_PATCH | CUTANEOUS | Status: DC
  Administered 2024-03-02: 1 via TRANSDERMAL
  Filled 2024-03-02 (×2): qty 1

## 2024-03-02 NOTE — ED Triage Notes (Signed)
 Patient c/o left groin x 2 weeks. Patient report worsening groin pain radiating to her left leg. Patient repot she got pain reliever shot and steroids p.o from her primary care without relief. Primary care recommended to be seen for further work up. Patient denies N/V. Patient denies fever.

## 2024-03-02 NOTE — ED Provider Notes (Signed)
 [place] EMERGENCY DEPARTMENT AT [facility] Provider Note   CSN: [id number] Arrival date & time: 03/02/24  1236     Patient presents with: Groin Pain (l) and Leg Pain   [name] is a 58 y.o. female.   Patient with history of chronic back pain, diabetes, fibromyalgia, GERD, pancreatitis, hypertension presents today with complaints of back pain.  She reports that symptoms ongoing for the last 2 weeks.  Reports she has had similar pain on the right side which was attributed to sciatica, she has had previous surgeries on her lumbar spine.  Denies any trauma, no falls or overuse.  She is currently walking with a cane.  She has tried a Medrol  Dosepak, 800 mg ibuprofen , oxycodone , and Flexeril  with minimal improvement.  She denies any abdominal pain, no nausea vomiting or diarrhea.  No loss of bowel or bladder function or saddle anesthesia.  No urinary symptoms.  Pain is left-sided and radiates down her left leg.  It is worse with movement.  She is able to go to the [facility] and float in the pool and with this has complete resolution of her pain.   The history is provided by the patient. No language interpreter was used.  Groin Pain  Leg Pain Associated symptoms: back pain        Prior to Admission medications   Medication Sig Start Date End Date Taking? Authorizing Provider  albuterol  (PROVENTIL ) (2.5 MG/3ML) 0.083% nebulizer solution Take 3 mLs (2.5 mg total) by nebulization every 6 (six) hours as needed for wheezing or shortness of breath. 12/12/19   Babara, Amy V, PA-C  albuterol  (VENTOLIN  HFA) 108 (90 Base) MCG/ACT inhaler Inhale 2 puffs into the lungs every 4 (four) hours as needed for wheezing. 06/07/22   Billy Asberry FALCON, PA-C  ALPRAZolam  (XANAX ) 1 MG tablet Take 1 mg by mouth daily as needed for anxiety. 10/23/19   [provider]  amLODipine -olmesartan  (AZOR ) 5-40 MG tablet Take 1 tablet by mouth daily. 06/05/21   [provider]  ammonium lactate   (AMLACTIN) 12 % cream Apply 1 Application topically as needed for dry skin. 11/13/23   Gershon Donnice SAUNDERS, DPM  aspirin  EC 81 MG tablet Take 81 mg by mouth daily. Swallow whole.    [provider]  ciclopirox  (PENLAC ) 8 % solution Apply topically at bedtime. Apply over nail and surrounding skin. Apply daily over previous coat. After seven (7) days, may remove with alcohol and continue cycle. 11/13/23   Gershon Donnice SAUNDERS, DPM  cyclobenzaprine  (FLEXERIL ) 10 MG tablet Take 1 tablet (10 mg total) by mouth 2 (two) times daily as needed for muscle spasms. Patient taking differently: Take 10 mg by mouth daily as needed for muscle spasms. 06/02/23   Billy Asberry FALCON, PA-C  diclofenac  (VOLTAREN ) 50 MG EC tablet TAKE 1 TABLET(50 MG) BY MOUTH TWICE DAILY 03/01/24   Georgina Ozell LABOR, MD  escitalopram  (LEXAPRO ) 20 MG tablet Take 20 mg by mouth daily. Patient not taking: Reported on 11/13/2023 08/10/20   [provider]  fluticasone  (FLONASE ) 50 MCG/ACT nasal spray Place 2 sprays into both nostrils daily as needed for allergies or rhinitis.    [provider]  ibuprofen  (ADVIL ) 800 MG tablet Take 1 tablet (800 mg total) by mouth every 8 (eight) hours as needed (pain). 11/18/23   Vonna Sharlet POUR, MD  Insulin  Lispro Prot & Lispro (HUMALOG  MIX 75/25 KWIKPEN) (75-25) 100 UNIT/ML Kwikpen Inject 80 units in the morning and 70 units in  the evening 08/09/23     Insulin  Pen Needle 32G X 4 MM MISC 1 each by Does not apply route 3 (three) times daily. 09/08/22   Jillian Buttery, MD  lidocaine  (LIDODERM ) 5 % Place 1 patch onto the skin daily. Remove & Discard patch within 12 hours or as directed by MD Patient taking differently: Place 1 patch onto the skin daily as needed (pain). Remove & Discard patch within 12 hours or as directed by MD 06/09/23   Victor Lynwood DASEN, PA-C  metoprolol  succinate (TOPROL -XL) 50 MG 24 hr tablet Take 50 mg by mouth daily. 07/21/14   [provider]  Multiple  Vitamins-Minerals (MULTIVITAMIN WITH MINERALS) tablet Take 1 tablet by mouth daily.    [provider]  oxyCODONE  (OXY IR/ROXICODONE ) 5 MG immediate release tablet Take 5 mg by mouth as needed for moderate pain (pain score 4-6) or severe pain (pain score 7-10). 09/22/23   [provider]  pantoprazole  (PROTONIX ) 40 MG tablet Take 40 mg by mouth daily. 01/15/22   [provider]  simvastatin  (ZOCOR ) 20 MG tablet Take 20 mg by mouth daily. 08/22/20   [provider]    Allergies: Patient has no known allergies.    Review of Systems  Musculoskeletal:  Positive for back pain.  All other systems reviewed and are negative.   Updated Vital Signs BP (!) 143/42 (BP Location: Right Arm)   Pulse 92   Temp 97.6 F (36.4 C) (Oral)   Resp 20   SpO2 97%   Physical Exam Vitals and nursing note reviewed.  Constitutional:      General: She is not in acute distress.    Appearance: Normal appearance. She is normal weight. She is not ill-appearing, toxic-appearing or diaphoretic.  HENT:     Head: Normocephalic and atraumatic.  Cardiovascular:     Rate and Rhythm: Normal rate.  Pulmonary:     Effort: Pulmonary effort is normal. No respiratory distress.  Abdominal:     General: Abdomen is flat.     Palpations: Abdomen is soft.     Tenderness: There is no abdominal tenderness.  Musculoskeletal:        General: Normal range of motion.     Cervical back: Normal range of motion.     Comments: No midline tenderness to palpation of the cervical, thoracic, or lumbar spine.  No step-offs, lesions, deformity, or overlying skin changes.  Does have tenderness to the left lower back area.  Positive SLR on the left.  DP and PT pulses intact and 2+.  5/5 strength and sensation intact bilateral lower extremities.  Patient observed to be ambulatory with a cane with steady gait.  Skin:    General: Skin is warm and dry.  Neurological:     General: No focal deficit present.      Mental Status: She is alert.  Psychiatric:        Mood and Affect: Mood normal.        Behavior: Behavior normal.     (all labs ordered are listed, but only abnormal results are displayed) Labs Reviewed  COMPREHENSIVE METABOLIC PANEL WITH GFR - Abnormal; Notable for the following components:      Result Value   Glucose, Bld 185 (*)    Calcium 8.5 (*)    Alkaline Phosphatase 131 (*)    All other components within normal limits  CBC WITH DIFFERENTIAL/PLATELET - Abnormal; Notable for the following components:   WBC 10.7 (*)    RBC  5.33 (*)    All other components within normal limits    EKG: None  Radiology: DG Lumbar Spine Complete Result Date: 03/02/2024 CLINICAL DATA:  Back pain for 2 weeks. EXAM: LUMBAR SPINE - COMPLETE 4+ VIEW COMPARISON:  MRI 08/22/2023, radiograph 07/15/2024 FINDINGS: Five non-rib-bearing lumbar vertebra. Stable lumbar alignment with straightening of normal lordosis and 3-4 mm anterolisthesis of L3 on L4. Interbody spacers at L4-L5 and L5-S1, stable in position from prior. Mild L3-L4 disc space narrowing is unchanged. Facet hypertrophy at L3-L4. No fracture or compression deformity. Degenerative change about both sacroiliac joints. IMPRESSION: 1. No acute findings. 2. Stable interbody spacers at L4-L5 and L5-S1. 3. Stable mild L3-L4 disc space narrowing and facet hypertrophy and grade 1 anterolisthesis. Electronically Signed   By: Andrea Gasman M.D.   On: 03/02/2024 14:59     Procedures   Medications Ordered in the ED  lidocaine  (LIDODERM ) 5 % 1 patch (1 patch Transdermal Patch Applied 03/02/24 1703)  HYDROmorphone  (DILAUDID ) injection 1 mg (1 mg Intramuscular Given 03/02/24 1702)  methocarbamol  (ROBAXIN ) tablet 1,000 mg (1,000 mg Oral Given 03/02/24 1703)                                    Medical Decision Making Amount and/or Complexity of Data Reviewed Labs: ordered. Radiology: ordered.  Risk Prescription drug management.   This patient is a  58 y.o. female who presents to the ED for concern of back pain, this involves an extensive number of treatment options, and is a complaint that carries with it a high risk of complications and morbidity. The emergent differential diagnosis prior to evaluation includes, but is not limited to,  Fracture (acute/chronic), muscle strain, cauda equina, spinal stenosis, DDD, metastatic cancer, vertebral osteomyelitis, kidney stone, pyelonephritis, AAA, pancreatitis, bowel obstruction, meningitis.  This is not an exhaustive differential.   Past Medical History / Co-morbidities / Social History:  has a past medical history of Anxiety, Arthritis, Bronchitis, Chronic back pain, Depression, Diabetes mellitus, Fatty liver, Fibromyalgia, GERD (gastroesophageal reflux disease), Headache, Hypertension, Pancreatitis, Pneumonia, and Rotator cuff tear, right.  Additional history: Chart reviewed. Pertinent results include: hx chronic back pain status post lumbar surgery previously, diagnosed with right sided sciatica. Unable to review pcp notes  Physical Exam: Physical exam performed. The pertinent findings include:   No midline tenderness to palpation of the cervical, thoracic, or lumbar spine.  No step-offs, lesions, deformity, or overlying skin changes.  Does have tenderness to the left lower back area.  Positive SLR on the left.  DP and PT pulses intact and 2+.  5/5 strength and sensation intact bilateral lower extremities.  Patient observed to be ambulatory with a cane with steady gait.  Lab Tests: I ordered, and personally interpreted labs.  The pertinent results include:  WBC 10.7. No other acute laboratory abnormalities   Imaging Studies: I ordered imaging studies including Dg lumbar spine. I independently visualized and interpreted imaging which showed   1. No acute findings. 2. Stable interbody spacers at L4-L5 and L5-S1. 3. Stable mild L3-L4 disc space narrowing and facet hypertrophy and grade 1  anterolisthesis.  I agree with the radiologist interpretation.  Medications: I ordered medication including dilaudid , robaxin , lidocaine  patches  for pain. Reevaluation of the patient after these medicines showed that the patient improved. I have reviewed the patients home medicines and have made adjustments as needed.   Disposition: After consideration of the diagnostic  results and the patients response to treatment, I feel that emergency department workup does not suggest an emergent condition requiring admission or immediate intervention beyond what has been performed at this time. The plan is: Discharge with close outpatient follow-up and return precautions.  Patient's workup is benign, after above interventions she is feeling some improvement and is ready to go home.  I do suspect that she has sciatica and that this is the etiology of her symptoms.  Given that she has tried Tylenol , ibuprofen , oxycodone , Flexeril , lidocaine  patches, and steroids for this pain, but she likely needs to follow-up with her orthopedist and may be pain management as well.  She has no signs or symptoms to suggest acute intra-abdominal pathology of symptoms.  No signs of kidney stone, UTI, or AAA.  Discussing with patient, she agrees she suspects this is a flare of sciatica.  She has no signs or symptoms to suggest cauda equina. She does report she only has a few doses of percocet left, will send for oxycodone  without tylenol  so she can take a full dose of 1000 mg of Tylenol  as an adjunct to the oxycodone . PDMP reviewed, patient advised not to drive or operate heavy machinery while taking this medication.  Evaluation and diagnostic testing in the emergency department does not suggest an emergent condition requiring admission or immediate intervention beyond what has been performed at this time.  Plan for discharge with close PCP follow-up.  Patient is understanding and amenable with plan, educated on red flag symptoms that  would prompt immediate return.  Patient discharged in stable condition.  Final diagnoses:  Sciatica of left side    ED Discharge Orders          Ordered    oxyCODONE  (ROXICODONE ) 5 MG immediate release tablet  Every 4 hours PRN        03/02/24 1920          An After Visit Summary was printed and given to the patient.      Nora Lauraine DELENA DEVONNA 03/02/24 1921    Francesca Elsie CROME, MD 03/02/24 2239

## 2024-03-02 NOTE — Discharge Instructions (Addendum)
 Your back pain is most likely due to a flare of your scatica.  There is been a lot of research on back pain, unfortunately the only thing that seems to really help is Tylenol  and ibuprofen .  Relative rest is also important to not lift greater than 10 pounds bending or twisting at the waist.  Please follow-up with your family physician.  The other thing that really seems to benefit patients is physical therapy which your doctor may send you for.  Please return to the emergency department for new numbness or weakness to your arms or legs. Difficulty with urinating or urinating or pooping on yourself.  Also if you cannot feel toilet paper when you wipe or get a fever.   Take 4 over the counter ibuprofen  tablets 3 times a day or 2 over-the-counter naproxen  tablets twice a day for pain. Also take tylenol  1000mg (2 extra strength) four times a day.  Additionally, I have given you a prescription for oxycodone  which is a narcotic pain medication you can take as prescribed as needed for severe pain only.  Do not drive or operate machinery while taking this medication as it can make you sleepy.  Return if development of any new or worsening symptoms.

## 2024-03-02 NOTE — ED Provider Triage Note (Signed)
 Emergency Medicine Provider Triage Evaluation Note  [name] , a 58 y.o. female  was evaluated in triage.  Pt complains of back pain. Pain x 2 weeks, has tried steroids, oxycodone , and 800 mg ibuprofen  without relief. No new trauma, does have history of prior lumbar surgeries. Pain radiates down the left leg, she is walking with a cane. No loss of bowel or bladder function or saddle anaesthesia. No nausea, vomiting, diarrhea, fevers, chills, or urinary sx.  Review of Systems  Positive:  Negative:   Physical Exam  BP 129/89   Pulse 100   Temp 98 F (36.7 C)   Resp 16   SpO2 100%  Gen:   Awake, no distress  uncomfortable appearing Resp:  Normal effort  MSK:   Moves extremities without difficulty L side TTP, no midline lumbar tenderness. Pain extends down the left leg. DP Pt pulses intact. Other:    Medical Decision Making  Medically screening exam initiated at 1:26 PM.  Appropriate orders placed.  Adylee Leonardo Steier was informed that the remainder of the evaluation will be completed by another provider, this initial triage assessment does not replace that evaluation, and the importance of remaining in the ED until their evaluation is complete.     Nora Lauraine LABOR, PA-C 03/02/24 1329

## 2024-04-15 ENCOUNTER — Other Ambulatory Visit (INDEPENDENT_AMBULATORY_CARE_PROVIDER_SITE_OTHER)

## 2024-04-15 ENCOUNTER — Ambulatory Visit (INDEPENDENT_AMBULATORY_CARE_PROVIDER_SITE_OTHER): Admitting: Orthopedic Surgery

## 2024-04-15 DIAGNOSIS — E1142 Type 2 diabetes mellitus with diabetic polyneuropathy: Secondary | ICD-10-CM | POA: Diagnosis not present

## 2024-04-15 DIAGNOSIS — Z794 Long term (current) use of insulin: Secondary | ICD-10-CM | POA: Diagnosis not present

## 2024-04-15 DIAGNOSIS — M545 Low back pain, unspecified: Secondary | ICD-10-CM | POA: Diagnosis not present

## 2024-04-15 LAB — POCT GLYCOSYLATED HEMOGLOBIN (HGB A1C): Hemoglobin A1C: 9.6 % — AB (ref 4.0–5.6)

## 2024-04-15 MED ORDER — HYDROCODONE-ACETAMINOPHEN 5-325 MG PO TABS: 1.0000 | ORAL_TABLET | Freq: Four times a day (QID) | ORAL | 0 refills | Status: AC | PRN

## 2024-04-15 NOTE — Progress Notes (Signed)
 Orthopedic Spine Surgery Office Note   Assessment: [name] with low back pain that radiates into the left lower extremity.  She feels the pain going into the left anterior thigh and left medial proximal leg.  Has a foraminal disc herniation at L3/4 on the left causing radiculopathy     Plan: -Patient has tried PT, Tylenol , diclofenac , oral steroids, steroid injections, gabapentin , narcotics -Prescribed Norco to help her with pain for now. Explained that there is a risk for addiction since this is a opioid medication  -Patient has tried multiple conservative treatments over the last 3 months without any relief of her pain and she has started develop weakness, so discussed operative management as an option.  After our conversation, patient elected to proceed. -Patient will next be seen at date of surgery     This is a difficult situation.  Patient has developed weakness and has significant pain that is causing her to take narcotics.  There is a risk of addiction with chronic narcotic use.  However, she has not been able to control her A1c despite me advising her to do that telling her that she is not a good operative candidate with an elevated A1c.  Her A1c is actually going up.  On today's visit, her A1c in the office was 9.6 whereas it was 9.0 at her last check of the A1c.  She also continues to use nicotine  in spite of me advising her multiple times to quit and that that would prevent her from doing elective surgery.  I told her that if she is smoking at the time of surgery, I we will cancel the surgery since she has complete control over that.  She does need to work on her A1c but I understand that can be more difficult to control.  Her nicotine  use is completely within her control. I may even do a test before her surgery to ensure that she has quit. She should participate in her healthcare. Given her elevated A1c, I did explain that she is at increased risk of wound healing  complications and infection.  This can result in secondary surgery.  She expressed understanding but she is in so much pain that she wants to proceed.   The patient has symptoms consistent with lumbar radiculopathy. The patient's symptoms were not getting improvement with conservative treatment so operative management was discussed in the form of L3/4 TLIF and PSIF. I will need to do a completed facetectomy on the left to adequately decompress the nerve root and remove the foraminal disc herniation. Since this would destabilize the spine, I would stabilize with TLIF and PSIF. The risks including but not limited to dural tear, nerve root injury, paralysis, persistent pain, pseudarthrosis, infection, bleeding, hardware failure/malposition, adjacent segment disease, heart attack, death, fracture, blindness, and need for additional procedures were discussed with the patient. The benefit of the surgery would be improvement in the patient's radiating leg pain. I explained that back pain relief is not the goal of the surgery and it is not reliably alleviated with this surgery. The alternatives to surgical management were covered with the patient and included continued monitoring, physical therapy, over-the-counter pain medications, ambulatory aids, repeat injections, and activity modification. All the patient's questions were answered to her satisfaction. After this discussion, the patient expressed understanding and elected to proceed with surgical intervention.    ___________________________________________________________________________     History:   [name] who presents today for follow up  on her lumbar spine.  Patient is having severe pain in her low back rating into the left lower extremity.  She feels the going into the anterior thigh and proximal medial leg.  She has no pain radiating to the right lower extremity.  This pain has gotten progressively worse with time.  She has difficulty  with ambulating as she has developed some weakness in her hip and knee.  She has been using a cane.  She says she cannot go on living like this because the pain is so severe.  She is taking narcotics and is still having severe pain.  She has gone to the emergency department for this problem before.  She feels the pain on a daily basis.  She notes it with activity and at rest.  She does recall me talking about her A1c and nicotine .  She said that she thinks her A1c has been going down.  She does admit that she has continued to use nicotine  but feels that she can quit.   Treatments tried: PT, Tylenol , diclofenac , oral steroids, steroid injections, gabapentin , narcotics     Physical Exam:   General: no acute distress, appears stated age, tearful, appears to be in pain Neurologic: alert, answering questions appropriately, following commands Respiratory: unlabored breathing on room air, symmetric chest rise Psychiatric: appropriate affect, normal cadence to speech     MSK (spine):   -Strength exam                                                   Left                  Right EHL                              5/5                  5/5 TA                                 5/5                  5/5 GSC                             5/5                  5/5 Knee extension            4/5                  5/5 Hip flexion                    4/5                  5/5   -Sensory exam                           Sensation intact to light touch in L3-S1 nerve distributions of bilateral lower extremities    Imaging: XRs of the lumbar spine from 04/15/2024 were independently reviewed and interpreted, showing interbody bases at L4/5 and L5/S1.  No  lucency seen around the interbody's and they appear in appropriate position.  There is a grade 1 spondylolisthesis at L3/4 with disc height loss.  No fracture or dislocation seen.   MRI of the lumbar spine from 08/22/2023 was previously independently reviewed and  interpreted, showing foraminal disc herniation on the left at L3/4 causing foraminal stenosis.  No other significant stenosis seen.  Spondylolisthesis at L3/4.      Patient name: [name] Patient MRN: [id number] Date of visit: 04/15/24

## 2024-05-11 ENCOUNTER — Telehealth

## 2024-05-20 ENCOUNTER — Telehealth: Payer: Self-pay | Admitting: Orthopedic Surgery

## 2024-05-20 NOTE — Telephone Encounter (Addendum)
 FYI: I spoke with the patient today in regards to scheduling surgery. Patients Endo doctor did not give clearance for surgery due to last A!C on 04/15/24 being 9.6. Patient will need to get A1C down to 7.5 or lower to be scheduled for surgery. Patient understood and will contact Endo for repeat lab draw, and possible medication change (per patient).

## 2024-06-14 ENCOUNTER — Encounter: Payer: Self-pay | Admitting: Radiology

## 2024-09-03 ENCOUNTER — Other Ambulatory Visit: Payer: Self-pay | Admitting: Orthopedic Surgery
